# Patient Record
Sex: Female | Born: 1970 | Race: Black or African American | Hispanic: No | Marital: Married | Smoking: Never smoker
Health system: Southern US, Community
[De-identification: ages and names within clinical notes are randomized; demographics above are authoritative.]

## PROBLEM LIST (undated history)

## (undated) DIAGNOSIS — D649 Anemia, unspecified: Secondary | ICD-10-CM

## (undated) DIAGNOSIS — I10 Essential (primary) hypertension: Secondary | ICD-10-CM

## (undated) DIAGNOSIS — M549 Dorsalgia, unspecified: Secondary | ICD-10-CM

## (undated) HISTORY — PX: CHOLECYSTECTOMY: SHX55

---

## 2006-03-11 ENCOUNTER — Emergency Department (HOSPITAL_COMMUNITY): Admission: EM | Admit: 2006-03-11 | Discharge: 2006-03-11 | Payer: Self-pay | Admitting: Emergency Medicine

## 2007-11-02 ENCOUNTER — Emergency Department (HOSPITAL_COMMUNITY): Admission: EM | Admit: 2007-11-02 | Discharge: 2007-11-02 | Payer: Self-pay | Admitting: Emergency Medicine

## 2011-07-11 ENCOUNTER — Emergency Department: Payer: Self-pay | Admitting: *Deleted

## 2013-09-02 ENCOUNTER — Encounter (HOSPITAL_COMMUNITY): Payer: Self-pay | Admitting: Emergency Medicine

## 2013-09-02 ENCOUNTER — Emergency Department (HOSPITAL_COMMUNITY)
Admission: EM | Admit: 2013-09-02 | Discharge: 2013-09-02 | Disposition: A | Discharge status: Home / Self Care | Payer: Self-pay | Attending: Emergency Medicine | Admitting: Emergency Medicine

## 2013-09-02 DIAGNOSIS — I10 Essential (primary) hypertension: Secondary | ICD-10-CM | POA: Insufficient documentation

## 2013-09-02 DIAGNOSIS — S39012A Strain of muscle, fascia and tendon of lower back, initial encounter: Secondary | ICD-10-CM

## 2013-09-02 DIAGNOSIS — S335XXA Sprain of ligaments of lumbar spine, initial encounter: Secondary | ICD-10-CM | POA: Insufficient documentation

## 2013-09-02 DIAGNOSIS — Z79899 Other long term (current) drug therapy: Secondary | ICD-10-CM | POA: Insufficient documentation

## 2013-09-02 DIAGNOSIS — IMO0002 Reserved for concepts with insufficient information to code with codable children: Secondary | ICD-10-CM | POA: Insufficient documentation

## 2013-09-02 DIAGNOSIS — IMO0001 Reserved for inherently not codable concepts without codable children: Secondary | ICD-10-CM | POA: Insufficient documentation

## 2013-09-02 DIAGNOSIS — Y9389 Activity, other specified: Secondary | ICD-10-CM | POA: Insufficient documentation

## 2013-09-02 DIAGNOSIS — Y92410 Unspecified street and highway as the place of occurrence of the external cause: Secondary | ICD-10-CM | POA: Insufficient documentation

## 2013-09-02 HISTORY — DX: Essential (primary) hypertension: I10

## 2013-09-02 MED ORDER — NAPROXEN 250 MG PO TABS
500.0000 mg | ORAL_TABLET | Freq: Once | ORAL | Status: AC
  Administered 2013-09-02: 500 mg via ORAL
  Filled 2013-09-02: qty 2

## 2013-09-02 MED ORDER — NAPROXEN 500 MG PO TABS
500.0000 mg | ORAL_TABLET | Freq: Two times a day (BID) | ORAL | Status: DC
Start: 2013-09-02 — End: 2014-04-24

## 2013-09-02 MED ORDER — METHOCARBAMOL 500 MG PO TABS
1000.0000 mg | ORAL_TABLET | Freq: Once | ORAL | Status: AC
  Administered 2013-09-02: 1000 mg via ORAL
  Filled 2013-09-02: qty 2

## 2013-09-02 MED ORDER — METHOCARBAMOL 500 MG PO TABS: 1000.0000 mg | ORAL_TABLET | Freq: Four times a day (QID) | ORAL | Status: AC
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
## 2013-09-04 NOTE — ED Provider Notes (Signed)
Medical screening examination/treatment/procedure(s) were performed by non-physician practitioner and as supervising physician I was immediately available for consultation/collaboration.   EKG Interpretation None        Maudry Diego, MD 09/04/13 1451

## 2014-01-22 ENCOUNTER — Emergency Department (HOSPITAL_COMMUNITY)
Admission: EM | Admit: 2014-01-22 | Discharge: 2014-01-22 | Disposition: A | Discharge status: Home / Self Care | Payer: Medicaid - Out of State | Attending: Emergency Medicine | Admitting: Emergency Medicine

## 2014-01-22 ENCOUNTER — Encounter (HOSPITAL_COMMUNITY): Payer: Self-pay | Admitting: *Deleted

## 2014-01-22 DIAGNOSIS — J069 Acute upper respiratory infection, unspecified: Secondary | ICD-10-CM | POA: Insufficient documentation

## 2014-01-22 DIAGNOSIS — G8929 Other chronic pain: Secondary | ICD-10-CM | POA: Diagnosis not present

## 2014-01-22 DIAGNOSIS — Z79899 Other long term (current) drug therapy: Secondary | ICD-10-CM | POA: Insufficient documentation

## 2014-01-22 DIAGNOSIS — Z7952 Long term (current) use of systemic steroids: Secondary | ICD-10-CM | POA: Insufficient documentation

## 2014-01-22 DIAGNOSIS — Z791 Long term (current) use of non-steroidal anti-inflammatories (NSAID): Secondary | ICD-10-CM | POA: Diagnosis not present

## 2014-01-22 DIAGNOSIS — M549 Dorsalgia, unspecified: Secondary | ICD-10-CM | POA: Insufficient documentation

## 2014-01-22 DIAGNOSIS — I10 Essential (primary) hypertension: Secondary | ICD-10-CM | POA: Insufficient documentation

## 2014-01-22 DIAGNOSIS — R05 Cough: Secondary | ICD-10-CM | POA: Diagnosis present

## 2014-01-22 HISTORY — DX: Dorsalgia, unspecified: M54.9

## 2014-01-22 MED ORDER — HYDROCODONE-ACETAMINOPHEN 5-325 MG PO TABS: 1.0000 | ORAL_TABLET | ORAL | Status: DC | PRN

## 2014-01-22 MED ORDER — BENZONATATE 100 MG PO CAPS
ORAL_CAPSULE | ORAL | Status: AC
  Administered 2014-01-22: 200 mg via ORAL
  Filled 2014-01-22: qty 1

## 2014-01-22 MED ORDER — BENZONATATE 100 MG PO CAPS: 200.0000 mg | ORAL_CAPSULE | Freq: Three times a day (TID) | ORAL | Status: DC | PRN

## 2014-01-22 MED ORDER — PREDNISONE 50 MG PO TABS
60.0000 mg | ORAL_TABLET | Freq: Once | ORAL | Status: AC
  Administered 2014-01-22: 60 mg via ORAL
  Filled 2014-01-22 (×2): qty 1

## 2014-01-22 MED ORDER — PREDNISONE 10 MG PO TABS: ORAL_TABLET | ORAL | Status: DC

## 2014-01-22 MED ORDER — BENZONATATE 100 MG PO CAPS
200.0000 mg | ORAL_CAPSULE | Freq: Once | ORAL | Status: AC
  Administered 2014-01-22: 200 mg via ORAL
  Filled 2014-01-22: qty 2

## 2014-01-22 NOTE — ED Notes (Signed)
Patient given discharge instruction, verbalized understand. Patient ambulatory out of the department.  

## 2014-01-22 NOTE — Discharge Instructions (Signed)
Back Pain, Adult Back pain is very common. The pain often gets better over time. The cause of back pain is usually not dangerous. Most people can learn to manage their back pain on their own.  HOME CARE   Stay active. Start with short walks on flat ground if you can. Try to walk farther each day.  Do not sit, drive, or stand in one place for more than 30 minutes. Do not stay in bed.  Do not avoid exercise or work. Activity can help your back heal faster.  Be careful when you bend or lift an object. Bend at your knees, keep the object close to you, and do not twist.  Sleep on a firm mattress. Lie on your side, and bend your knees. If you lie on your back, put a pillow under your knees.  Only take medicines as told by your doctor.  Put ice on the injured area.  Put ice in a plastic bag.  Place a towel between your skin and the bag.  Leave the ice on for 15-20 minutes, 03-04 times a day for the first 2 to 3 days. After that, you can switch between ice and heat packs.  Ask your doctor about back exercises or massage.  Avoid feeling anxious or stressed. Find good ways to deal with stress, such as exercise. GET HELP RIGHT AWAY IF:   Your pain does not go away with rest or medicine.  Your pain does not go away in 1 week.  You have new problems.  You do not feel well.  The pain spreads into your legs.  You cannot control when you poop (bowel movement) or pee (urinate).  Your arms or legs feel weak or lose feeling (numbness).  You feel sick to your stomach (nauseous) or throw up (vomit).  You have belly (abdominal) pain.  You feel like you may pass out (faint). MAKE SURE YOU:   Understand these instructions.  Will watch your condition.  Will get help right away if you are not doing well or get worse. Document Released: 07/13/2007 Document Revised: 04/18/2011 Document Reviewed: 05/28/2013 [facility] Patient Information 2015 [place], [place]. This information is not intended  to replace advice given to you by your health care provider. Make sure you discuss any questions you have with your health care provider.  Upper Respiratory Infection, Adult An upper respiratory infection (URI) is also sometimes known as the common cold. The upper respiratory tract includes the nose, sinuses, throat, trachea, and bronchi. Bronchi are the airways leading to the lungs. Most people improve within 1 week, but symptoms can last up to 2 weeks. A residual cough may last even longer.  CAUSES Many different viruses can infect the tissues lining the upper respiratory tract. The tissues become irritated and inflamed and often become very moist. Mucus production is also common. A cold is contagious. You can easily spread the virus to others by oral contact. This includes kissing, sharing a glass, coughing, or sneezing. Touching your mouth or nose and then touching a surface, which is then touched by another person, can also spread the virus. SYMPTOMS  Symptoms typically develop 1 to 3 days after you come in contact with a cold virus. Symptoms vary from person to person. They may include:  Runny nose.  Sneezing.  Nasal congestion.  Sinus irritation.  Sore throat.  Loss of voice (laryngitis).  Cough.  Fatigue.  Muscle aches.  Loss of appetite.  Headache.  Low-grade fever. DIAGNOSIS  You might diagnose  your own cold based on familiar symptoms, since most people get a cold 2 to 3 times a year. Your caregiver can confirm this based on your exam. Most importantly, your caregiver can check that your symptoms are not due to another disease such as strep throat, sinusitis, pneumonia, asthma, or epiglottitis. Blood tests, throat tests, and X-rays are not necessary to diagnose a common cold, but they may sometimes be helpful in excluding other more serious diseases. Your caregiver will decide if any further tests are required. RISKS AND COMPLICATIONS  You may be at risk for a more severe  case of the common cold if you smoke cigarettes, have chronic heart disease (such as heart failure) or lung disease (such as asthma), or if you have a weakened immune system. The very young and very old are also at risk for more serious infections. Bacterial sinusitis, middle ear infections, and bacterial pneumonia can complicate the common cold. The common cold can worsen asthma and chronic obstructive pulmonary disease (COPD). Sometimes, these complications can require emergency medical care and may be life-threatening. PREVENTION  The best way to protect against getting a cold is to practice good hygiene. Avoid oral or hand contact with people with cold symptoms. Wash your hands often if contact occurs. There is no clear evidence that vitamin C, vitamin E, echinacea, or exercise reduces the chance of developing a cold. However, it is always recommended to get plenty of rest and practice good nutrition. TREATMENT  Treatment is directed at relieving symptoms. There is no cure. Antibiotics are not effective, because the infection is caused by a virus, not by bacteria. Treatment may include:  Increased fluid intake. Sports drinks offer valuable electrolytes, sugars, and fluids.  Breathing heated mist or steam (vaporizer or shower).  Eating chicken soup or other clear broths, and maintaining good nutrition.  Getting plenty of rest.  Using gargles or lozenges for comfort.  Controlling fevers with ibuprofen or acetaminophen as directed by your caregiver.  Increasing usage of your inhaler if you have asthma. Zinc gel and zinc lozenges, taken in the first 24 hours of the common cold, can shorten the duration and lessen the severity of symptoms. Pain medicines may help with fever, muscle aches, and throat pain. A variety of non-prescription medicines are available to treat congestion and runny nose. Your caregiver can make recommendations and may suggest nasal or lung inhalers for other symptoms.  HOME  CARE INSTRUCTIONS   Only take over-the-counter or prescription medicines for pain, discomfort, or fever as directed by your caregiver.  Use a warm mist humidifier or inhale steam from a shower to increase air moisture. This may keep secretions moist and make it easier to breathe.  Drink enough water and fluids to keep your urine clear or pale yellow.  Rest as needed.  Return to work when your temperature has returned to normal or as your caregiver advises. You may need to stay home longer to avoid infecting others. You can also use a face mask and careful hand washing to prevent spread of the virus. SEEK MEDICAL CARE IF:   After the first few days, you feel you are getting worse rather than better.  You need your caregiver's advice about medicines to control symptoms.  You develop chills, worsening shortness of breath, or brown or red sputum. These may be signs of pneumonia.  You develop yellow or brown nasal discharge or pain in the face, especially when you bend forward. These may be signs of sinusitis.  You  develop a fever, swollen neck glands, pain with swallowing, or white areas in the back of your throat. These may be signs of strep throat. SEEK IMMEDIATE MEDICAL CARE IF:   You have a fever.  You develop severe or persistent headache, ear pain, sinus pain, or chest pain.  You develop wheezing, a prolonged cough, cough up blood, or have a change in your usual mucus (if you have chronic lung disease).  You develop sore muscles or a stiff neck. Document Released: 07/20/2000 Document Revised: 04/18/2011 Document Reviewed: 05/01/2013 [facility] Patient Information 2015 [place], [place]. This information is not intended to replace advice given to you by your health care provider. Make sure you discuss any questions you have with your health care provider.    Emergency Department Resource Guide 1) Find a Doctor and Pay Out of Pocket Although you won't have to find out who is covered  by your insurance plan, it is a good idea to ask around and get recommendations. You will then need to call the office and see if the doctor you have chosen will accept you as a new patient and what types of options they offer for patients who are self-pay. Some doctors offer discounts or will set up payment plans for their patients who do not have insurance, but you will need to ask so you aren't surprised when you get to your appointment.  2) Contact Your Local Health Department Not all health departments have doctors that can see patients for sick visits, but many do, so it is worth a call to see if yours does. If you don't know where your local health department is, you can check in your phone book. The CDC also has a tool to help you locate your state's health department, and many state websites also have listings of all of their local health departments.  3) Find a [facility] If your illness is not likely to be very severe or complicated, you may want to try a walk in clinic. These are popping up all over the country in pharmacies, drugstores, and shopping centers. They're usually staffed by nurse practitioners or physician assistants that have been trained to treat common illnesses and complaints. They're usually fairly quick and inexpensive. However, if you have serious medical issues or chronic medical problems, these are probably not your best option.  No Primary Care Doctor: - Call Health Connect at  [phone] - they can help you locate a primary care doctor that  accepts your insurance, provides certain services, etc. - Physician Referral Service- [phone]  Chronic Pain Problems: Organization         Address  Phone   Notes  [facility]  [phone] Patients need to be referred by their primary care doctor.   Medication Assistance: Organization         Address  Phone   Notes  [facility] Medication [facility] [place] [place], [place] [address] [phone] --Must be a resident of [facility] -- Must have NO insurance coverage whatsoever (no Medicaid/ Medicare, etc.) -- The pt. MUST have a primary care doctor that directs their care regularly and follows them in the community   Med[facility]ist  [phone]   [employer]  [phone]    Agencies that provide inexpensive medical care: Organization         Address  Phone   Notes  [place]  [phone]   [facility]    [phone]   [facility] [place], [place] [address] [phone]   [place] [address], [place] [phone]   Planned Parenthood    [phone]   [facility]    [phone]   [place] and [place] [address], [place] Phone:  [phone], Fax:  [phone] Hours of Operation:  9 am - 6 pm, M-F.  Also accepts Medicaid/Medicare and self-pay.  [facility] for [place] [address], [address], [place] Phone: [phone], Fax: [phone]. Hours of Operation:  8:30 am - 5:30 pm, M-F.  Also accepts Medicaid and self-pay.  [facility] [place] [address] [place] Phone: [phone]   [place], [place], [place] [phone], Ext. [phone] Mondays & Thursdays: 7-9 AM.  First 15 patients are seen on a first come, first serve basis.    [place] Providers:  Organization         Address  Phone   Notes  [facility] [address], [place] [phone] Also accepts self-pay patients.  [facility] [address], [place]  [phone]   [place], [address], [place] [phone]   [facility] [address] [place] [phone]   Lucianne Lei [address], [place]   [phone] Only accepts [place] Access [place] patients after they have their name applied to their card.   Self-Pay (no insurance) in [facility]:  Organization         Address  Phone   Notes  Sickle Cell Patients, [facility] Internal Medicine [place] [phone]   [facility] Urgent Care [place] [phone]   [facility] [place]  [place], [place], [place] [phone]   [facility]/Dr. Osei-Bonsu  [address], [place] or [address], [place] [phone] Phone number for both [place] and [place] locations is the same.  Urgent Medical and [facility] [address], [place] [phone]   [facility] [address] [place] or [address] [phone]   [facility] [address] [place] [phone], phone; [phone], fax Sees patients 1st and 3rd Saturday of every month.  Must not qualify for public or private insurance (i.e. Medicaid, Medicare, [place] Health Choice, Veterans' Benefits)  Household income should be no more than 200% of the poverty level The clinic cannot treat you if you are pregnant or think you are pregnant  Sexually transmitted diseases are not treated at the clinic.    Dental Care: Organization         Address  Phone  Notes  [facility] Department of [facility] [place] [phone] Accepts children up to age 21 who are enrolled in [place] or [place]; pregnant women with a Medicaid card; and children who have applied for Medicaid or [place] Health Choice, but were declined, whose parents can pay a reduced fee at time of service.  [facility] Department of [facility]  [address], [place] [phone]  Accepts children up to age 16 who are enrolled  in Medicaid or  Health Choice; pregnant women with a Medicaid card; and children who have applied for Medicaid or  Health Choice, but were declined, whose parents can pay a reduced fee at time of service.  [place] Adult [facility] Access PRO[facility]  [place] [phone] Patients are seen by appointment only. Walk-ins are not accepted. [place] will see patients 48 years of age and older. Monday - Tuesday (8am-5pm) Most Wednesdays (8:30-5pm) $30 per visit, cash only  [facility] Adult Dental Access PROGRAM  [address], [place] Sexually Violent [facility] Program [phone] Patients are seen by appointment only. Walk-ins are not accepted. [place] will see patients 36 years of age and older. One Wednesday Evening (Monthly: Volunteer Based).  $30 per visit, cash only  [place]  [phone] for adults; Children under age 49, call Graduate Pediatric Dentistry at [phone]. Children aged 33-14, please call [phone] to request a pediatric application.  Dental services are provided in all areas of dental care including fillings, crowns and bridges, complete and partial dentures, implants, gum treatment, root canals, and extractions. Preventive care is also provided. Treatment is provided to both adults and children. Patients are selected via a lottery and there is often a waiting list.   [facility] [address], [place]  [phone] [email or web address]   [facility] [address] [place], [place] [phone], Ext. [phone] Second and Fourth Thursday of each month, opens at 6:30 AM; Clinic ends at 9 AM.  Patients are seen on a first-come first-served basis, and a limited number are seen during each clinic.   [facility]  [address] Hillard Danker [place], [place] [phone]   Eligibility Requirements You must have lived in [place], [place], or [place] counties for at least the last three months.   You cannot be  eligible for state or federal sponsored [employer], including [employer], [place], or [employer].   You generally cannot be eligible for healthcare insurance through your employer.    How to apply: Eligibility screenings are held every Tuesday and Wednesday afternoon from 1:00 pm until 4:00 pm. You do not need an appointment for the interview!  [facility] [address], [place], [place]   [place]  [place] Department  [place]  [phone]    Behavioral Health Resources in the Community: Intensive Outpatient Programs Organization         Address  Phone  Notes  [place]. [address] [place], [place] [phone]   [facility] Outpatient [address], [place], [place]   ADS: Alcohol [facility] Drug [facility] [address], [place], [place]   [place] [address]  [place], [place] or [phone]   Substance Abuse Resources Organization         Address  Phone  Notes  Alcohol and Drug Services  [phone]   [place]  [phone]   [facility] [place]   Chinita Pester  [phone]   Residential & Outpatient Substance Abuse Program  [phone]   Psychological Services Organization         Address  Phone  Notes  [place]  [place]  [place]  [place] [address] [place] or [phone]    Mobile Crisis Teams Organization         Address  Phone  Notes  [facility], Mobile Crisis Care Unit  [phone]   [facility]  [address] [place], [place]   [place] [address] [place] [phone]    Self-Help/Support Groups Organization          Address  Phone             Notes  [place]. of [place] - variety of support groups  [place] Call for more information  Narcotics Anonymous (NA), [facility] [address], [employer] [place]  2 meetings at this location   Special educational needs teacher         Address  Phone  Notes  [facility] Residential Treatment [place],    [place]  [phone]   [facility]  [address] [place] [address], [place], [place]   [place], [place] [phone] Admissions: 8am-3pm M-F  Incentives Substance [place] [address]    [place], [place] [phone]   [facility] [address] [place], [place], [place]   [facility] [address],  [place], [place]   [facility] - Intensive Outpatient [address]een [address], [place], [place]   [facility] ([place] [place].,  [place], [place] [phone] or [phone]   [facility] (RTS) [address], [place], [place] Accepts Medicaid  [facility] [place] [address],  [place] [phone] Substance Abuse/Addiction Treatment   [facility] Organization         Address  Phone  Notes  [facility]  [phone]   Domenic Schwab, PhD [address] [place], [place]   [phone] or [phone]   [employer] [place], [place] [phone]   [facility] [address] [place], [place] [phone] Insurance/Medicaid/sponsorship through [facility] and Families [address], [address] [place]                                    [place], [place] [phone] [place] [address] [place] [address], [place] [phone]    Dr. Adele Schilder  [phone]   [facility] Dept. 1) [address] [place] 2) [place] 3)  [place] [address], [place] [phone]  [phone]   [place] [phone] or [phone] (After Hours)      You may take the hydrocodone prescribed for pain relief and cough suppression.  This will make you drowsy - do not drive within 4 hours of taking this medication.

## 2014-01-22 NOTE — ED Notes (Signed)
Pt states productive cough at times, clear in color. Lower back pain with hx of same. Denies urinary symptoms.

## 2014-01-24 NOTE — ED Provider Notes (Signed)
CSN: [id number]     Arrival date & time 01/22/14  1128 History   First MD Initiated Contact with Patient 01/22/14 1228     Chief Complaint  Patient presents with  . Cough     (Consider location/radiation/quality/duration/timing/severity/associated sxs/prior Treatment) The history is provided by the patient.   Diana Fox is a 43 y.o. female presenting with several day history of uri type symptoms which includes nasal congestion with clear rhinorrhea, sore throat and cough sometimes productive of a clear sputum.  Symptoms due to not include shortness of breath,hemoptysis,  chest pain,  Nausea, vomiting or diarrhea.  The patient has taken no medicines prior to arrival with no significant improvement in symptoms.  Additionally, reports recurrence of her chronic low back pain since yesterday.    Patient denies any new injury specifically,stating was injured on the job one year ago and while undergoing evaluation for this was terminated from her job.  She has tried to work but cannot find a job that she can tolerate with her chronic pain.  She denies radiation of pain nor  weakness or numbness in her legs and no urinary or bowel retention or incontinence.  Patient does not have a history of cancer or IVDU.       Past Medical History  Diagnosis Date  . Hypertension   . Back pain    Past Surgical History  Procedure Laterality Date  . Cholecystectomy     No family history on file. History  Substance Use Topics  . Smoking status: Never Smoker   . Smokeless tobacco: Not on file  . Alcohol Use: No   OB History    No data available     Review of Systems  Constitutional: Negative for fever and chills.  HENT: Positive for congestion, rhinorrhea and sore throat. Negative for ear pain, sinus pressure, trouble swallowing and voice change.   Eyes: Negative for discharge.  Respiratory: Positive for cough. Negative for shortness of breath, wheezing and stridor.   Cardiovascular:  Negative for chest pain and leg swelling.  Gastrointestinal: Negative for abdominal pain, constipation and abdominal distention.  Genitourinary: Negative.   Musculoskeletal: Positive for back pain. Negative for joint swelling and gait problem.  Skin: Negative for rash.  Neurological: Negative for weakness and numbness.      Allergies  Lisinopril  Home Medications   Prior to Admission medications   Medication Sig Start Date End Date Taking? Authorizing Provider  benzonatate (TESSALON) 100 MG capsule Take 2 capsules (200 mg total) by mouth 3 (three) times daily as needed for cough. 01/22/14   Evalee Jefferson, PA-C  HYDROcodone-acetaminophen (NORCO/VICODIN) 5-325 MG per tablet Take 1 tablet by mouth every 4 (four) hours as needed. 01/22/14   Evalee Jefferson, PA-C  naproxen (NAPROSYN) 500 MG tablet Take 1 tablet (500 mg total) by mouth 2 (two) times daily. 09/02/13   Evalee Jefferson, PA-C  nebivolol (BYSTOLIC) 10 MG tablet Take 10 mg by mouth daily.    Historical Provider, MD  predniSONE (DELTASONE) 10 MG tablet 6, 5, 4, 3, 2 then 1 tablet by mouth daily for 6 days total. 01/22/14   Evalee Jefferson, PA-C   BP 145/90 mmHg  Pulse 77  Temp(Src) 98.1 F (36.7 C) (Oral)  Ht 5\' 5"  (1.651 m)  Wt 172 lb 1 oz (78.047 kg)  BMI 28.63 kg/m2  SpO2 100%  LMP 01/07/2014 Physical Exam  Constitutional: She is oriented to person, place, and time. She appears well-developed and well-nourished.  HENT:  Head:  Normocephalic and atraumatic.  Right Ear: Tympanic membrane and ear canal normal.  Left Ear: Tympanic membrane and ear canal normal.  Nose: Mucosal edema and rhinorrhea present.  Mouth/Throat: Uvula is midline, oropharynx is clear and moist and mucous membranes are normal. No oropharyngeal exudate, posterior oropharyngeal edema, posterior oropharyngeal erythema or tonsillar abscesses.  Eyes: Conjunctivae are normal.  Neck: Normal range of motion. Neck supple.  Cardiovascular: Normal rate, normal heart sounds and  intact distal pulses.   Pedal pulses normal.  Pulmonary/Chest: Effort normal. No respiratory distress. She has no wheezes. She has no rales.  Abdominal: Soft. Bowel sounds are normal. She exhibits no distension and no mass. There is no tenderness.  Musculoskeletal: Normal range of motion. She exhibits no edema.       Lumbar back: She exhibits tenderness. She exhibits no swelling, no edema and no spasm.  Neurological: She is alert and oriented to person, place, and time. She has normal strength. She displays no atrophy and no tremor. No sensory deficit. Gait normal.  Reflex Scores:      Patellar reflexes are 2+ on the right side and 2+ on the left side.      Achilles reflexes are 2+ on the right side and 2+ on the left side. No strength deficit noted in hip and knee flexor and extensor muscle groups.  Ankle flexion and extension intact.  Skin: Skin is warm and dry. No rash noted.  Psychiatric: She has a normal mood and affect.  Nursing note and vitals reviewed.   ED Course  Procedures (including critical care time) Labs Review Labs Reviewed - No data to display  Imaging Review No results found.   EKG Interpretation None      MDM   Final diagnoses:  URI (upper respiratory infection)  Chronic back pain    Pt was prescribed prednisone taper and hydrocodone for her back pain and cough suppression.  Tessalon.  Referral to [facility] in [place] who may be able to assist with further eval/treatment of back in anticipation of getting pt working again.  Advised activity as tolerated.  Suspect viral uri process.  No respiratory distress, lungs clear.  Encouraged rest, increased fluid intake.  No neuro deficit on exam or by history to suggest emergent or surgical presentation.  Also discussed worsened sx that should prompt immediate re-evaluation including distal weakness, bowel/bladder retention/incontinence.          Evalee Jefferson, PA-C 01/24/14 1818  Maudry Diego,  MD 01/30/14 [phone]

## 2014-04-24 ENCOUNTER — Emergency Department (HOSPITAL_COMMUNITY)
Admission: EM | Admit: 2014-04-24 | Discharge: 2014-04-24 | Disposition: A | Discharge status: Home / Self Care | Payer: Medicaid - Out of State | Attending: Emergency Medicine | Admitting: Emergency Medicine

## 2014-04-24 ENCOUNTER — Encounter (HOSPITAL_COMMUNITY): Payer: Self-pay | Admitting: *Deleted

## 2014-04-24 DIAGNOSIS — Y939 Activity, unspecified: Secondary | ICD-10-CM | POA: Insufficient documentation

## 2014-04-24 DIAGNOSIS — I10 Essential (primary) hypertension: Secondary | ICD-10-CM | POA: Insufficient documentation

## 2014-04-24 DIAGNOSIS — Y999 Unspecified external cause status: Secondary | ICD-10-CM | POA: Insufficient documentation

## 2014-04-24 DIAGNOSIS — Z79899 Other long term (current) drug therapy: Secondary | ICD-10-CM | POA: Insufficient documentation

## 2014-04-24 DIAGNOSIS — X58XXXA Exposure to other specified factors, initial encounter: Secondary | ICD-10-CM | POA: Insufficient documentation

## 2014-04-24 DIAGNOSIS — S39012A Strain of muscle, fascia and tendon of lower back, initial encounter: Secondary | ICD-10-CM | POA: Insufficient documentation

## 2014-04-24 DIAGNOSIS — Y929 Unspecified place or not applicable: Secondary | ICD-10-CM | POA: Insufficient documentation

## 2014-04-24 MED ORDER — CYCLOBENZAPRINE HCL 5 MG PO TABS: 5.0000 mg | ORAL_TABLET | Freq: Three times a day (TID) | ORAL | Status: DC | PRN

## 2014-04-24 MED ORDER — NAPROXEN 500 MG PO TABS: 500.0000 mg | ORAL_TABLET | Freq: Two times a day (BID) | ORAL | Status: DC

## 2014-04-24 MED ORDER — HYDROCODONE-ACETAMINOPHEN 5-325 MG PO TABS: 1.0000 | ORAL_TABLET | ORAL | Status: DC | PRN

## 2014-04-24 NOTE — ED Notes (Signed)
Pt states lower back pain which began yesterday. States old injury 2 years ago.

## 2014-04-24 NOTE — ED Provider Notes (Signed)
CSN: [id number]     Arrival date & time 04/24/14  1353 History   First MD Initiated Contact with Patient 04/24/14 1531     Chief Complaint  Patient presents with  . Back Pain     (Consider location/radiation/quality/duration/timing/severity/associated sxs/prior Treatment) The history is provided by the patient.   Diana Fox is a 44 y.o. female presenting with acute low back pain starting yesterday.   Patient denies any new injury specifically, but experience similar symptoms on 2 other occasions since injuring her back at her job 2 years ago.  There is radiation of pain and denies no weakness or numbness into her lower extremities.  She denies urinary or bowel retention or incontinence.  Patient does not have a history of cancer or IVDU.  The patient has tried bayer back pain reliever without significant relief of symptoms.     Past Medical History  Diagnosis Date  . Hypertension   . Back pain    Past Surgical History  Procedure Laterality Date  . Cholecystectomy     No family history on file. History  Substance Use Topics  . Smoking status: Never Smoker   . Smokeless tobacco: Not on file  . Alcohol Use: No   OB History    No data available     Review of Systems  Constitutional: Negative for fever.  Respiratory: Negative for shortness of breath.   Cardiovascular: Negative for chest pain and leg swelling.  Gastrointestinal: Negative for abdominal pain, constipation and abdominal distention.  Genitourinary: Negative for dysuria, urgency, frequency, flank pain and difficulty urinating.  Musculoskeletal: Positive for back pain. Negative for joint swelling and gait problem.  Skin: Negative for rash.  Neurological: Negative for weakness and numbness.      Allergies  Lisinopril  Home Medications   Prior to Admission medications   Medication Sig Start Date End Date Taking? Authorizing Provider  nebivolol (BYSTOLIC) 10 MG tablet Take 10 mg by mouth daily.   Yes  Historical Provider, MD  benzonatate (TESSALON) 100 MG capsule Take 2 capsules (200 mg total) by mouth 3 (three) times daily as needed for cough. Patient not taking: Reported on 04/24/2014 01/22/14   Evalee Jefferson, PA-C  cyclobenzaprine (FLEXERIL) 5 MG tablet Take 1 tablet (5 mg total) by mouth 3 (three) times daily as needed for muscle spasms. 04/24/14   Evalee Jefferson, PA-C  HYDROcodone-acetaminophen (NORCO/VICODIN) 5-325 MG per tablet Take 1 tablet by mouth every 4 (four) hours as needed. 04/24/14   Evalee Jefferson, PA-C  naproxen (NAPROSYN) 500 MG tablet Take 1 tablet (500 mg total) by mouth 2 (two) times daily. 04/24/14   Evalee Jefferson, PA-C  predniSONE (DELTASONE) 10 MG tablet 6, 5, 4, 3, 2 then 1 tablet by mouth daily for 6 days total. Patient not taking: Reported on 04/24/2014 01/22/14   Evalee Jefferson, PA-C   BP 151/75 mmHg  Pulse 85  Temp(Src) 98.7 F (37.1 C) (Oral)  Resp 14  Ht 5\' 5"  (1.651 m)  Wt 160 lb (72.576 kg)  BMI 26.63 kg/m2  SpO2 98%  LMP 04/23/2014 Physical Exam  Constitutional: She appears well-developed and well-nourished.  HENT:  Head: Normocephalic.  Eyes: Conjunctivae are normal.  Neck: Normal range of motion. Neck supple.  Cardiovascular: Normal rate and intact distal pulses.   Pedal pulses normal.  Pulmonary/Chest: Effort normal.  Abdominal: Soft. Bowel sounds are normal. She exhibits no distension and no mass.  Musculoskeletal: Normal range of motion. She exhibits no edema.  Lumbar back: She exhibits tenderness. She exhibits no swelling, no edema and no spasm.  paralumbar ttp.  No midline pain, swelling or deformity.  Neurological: She is alert. She has normal strength. She displays no atrophy and no tremor. No sensory deficit. Gait normal.  Reflex Scores:      Patellar reflexes are 2+ on the right side and 2+ on the left side.      Achilles reflexes are 2+ on the right side and 2+ on the left side. No strength deficit noted in hip and knee flexor and extensor muscle  groups.  Ankle flexion and extension intact.  Skin: Skin is warm and dry.  Psychiatric: She has a normal mood and affect.  Nursing note and vitals reviewed.   ED Course  Procedures (including critical care time) Labs Review Labs Reviewed - No data to display  Imaging Review No results found.   EKG Interpretation None      MDM   Final diagnoses:  Lumbar strain, initial encounter    Acute lumbar pain.  Pt was prescribed naproxen, flexeril, hydrocodone for 24 hour coverage.  Heat, f/u with pcp for persistent sx.  No neuro deficit on exam or by history to suggest emergent or surgical presentation.  Also discussed worsened sx that should prompt immediate re-evaluation including distal weakness, bowel/bladder retention/incontinence.          Evalee Jefferson, PA-C 04/24/14 1649  Milton Ferguson, MD 04/28/14 [phone]

## 2014-04-24 NOTE — Discharge Instructions (Signed)
Back Pain, Adult °Low back pain is very common. About 1 in 5 people have back pain. The cause of low back pain is rarely dangerous. The pain often gets better over time. About half of people with a sudden onset of back pain feel better in just 2 weeks. About 8 in 10 people feel better by 6 weeks.  °CAUSES °Some common causes of back pain include: °· Strain of the muscles or ligaments supporting the spine. °· Wear and tear (degeneration) of the spinal discs. °· Arthritis. °· Direct injury to the back. °DIAGNOSIS °Most of the time, the direct cause of low back pain is not known. However, back pain can be treated effectively even when the exact cause of the pain is unknown. Answering your caregiver's questions about your overall health and symptoms is one of the most accurate ways to make sure the cause of your pain is not dangerous. If your caregiver needs more information, he or she may order lab work or imaging tests (X-rays or MRIs). However, even if imaging tests show changes in your back, this usually does not require surgery. °HOME CARE INSTRUCTIONS °For many people, back pain returns. Since low back pain is rarely dangerous, it is often a condition that people can learn to manage on their own.  °· Remain active. It is stressful on the back to sit or stand in one place. Do not sit, drive, or stand in one place for more than 30 minutes at a time. Take short walks on level surfaces as soon as pain allows. Try to increase the length of time you walk each day. °· Do not stay in bed. Resting more than 1 or 2 days can delay your recovery. °· Do not avoid exercise or work. Your body is made to move. It is not dangerous to be active, even though your back may hurt. Your back will likely heal faster if you return to being active before your pain is gone. °· Pay attention to your body when you  bend and lift. Many people have less discomfort when lifting if they bend their knees, keep the load close to their bodies, and  avoid twisting. Often, the most comfortable positions are those that put less stress on your recovering back. °· Find a comfortable position to sleep. Use a firm mattress and lie on your side with your knees slightly bent. If you lie on your back, put a pillow under your knees. °· Only take over-the-counter or prescription medicines as directed by your caregiver. Over-the-counter medicines to reduce pain and inflammation are often the most helpful. Your caregiver may prescribe muscle relaxant drugs. These medicines help dull your pain so you can more quickly return to your normal activities and healthy exercise. °· Put ice on the injured area. °¨ Put ice in a plastic bag. °¨ Place a towel between your skin and the bag. °¨ Leave the ice on for 15-20 minutes, 03-04 times a day for the first 2 to 3 days. After that, ice and heat may be alternated to reduce pain and spasms. °· Ask your caregiver about trying back exercises and gentle massage. This may be of some benefit. °· Avoid feeling anxious or stressed. Stress increases muscle tension and can worsen back pain. It is important to recognize when you are anxious or stressed and learn ways to manage it. Exercise is a great option. °SEEK MEDICAL CARE IF: °· You have pain that is not relieved with rest or medicine. °· You have pain that does not improve in 1 week. °· You have new symptoms. °· You are generally not feeling well. °SEEK   IMMEDIATE MEDICAL CARE IF:   You have pain that radiates from your back into your legs.  You develop new bowel or bladder control problems.  You have unusual weakness or numbness in your arms or legs.  You develop nausea or vomiting.  You develop abdominal pain.  You feel faint. Document Released: 01/24/2005 Document Revised: 07/26/2011 Document Reviewed: 05/28/2013 [facility] Patient Information 2015 [place], [place]. This information is not intended to replace advice given to you by your health care provider. Make sure you  discuss any questions you have with your health care provider.    Do not drive within 4 hours of taking hydrocodone as this will make you drowsy.  Avoid lifting,  Bending,  Twisting or any other activity that worsens your pain over the next week.  Apply a heating pad to your lower back for 20 minutes 3 times daily.  You should get rechecked if your symptoms are not better over the next 5 days,  Or you develop increased pain,  Weakness in your leg(s) or loss of bladder or bowel function - these are symptoms of a worse injury.

## 2014-04-24 NOTE — ED Notes (Signed)
Low back pain for last few days, no recent injury.

## 2014-10-14 ENCOUNTER — Emergency Department (HOSPITAL_COMMUNITY)
Admission: EM | Admit: 2014-10-14 | Discharge: 2014-10-14 | Disposition: A | Discharge status: Home / Self Care | Payer: Medicaid - Out of State | Attending: Emergency Medicine | Admitting: Emergency Medicine

## 2014-10-14 ENCOUNTER — Encounter (HOSPITAL_COMMUNITY): Payer: Self-pay | Admitting: Emergency Medicine

## 2014-10-14 DIAGNOSIS — I10 Essential (primary) hypertension: Secondary | ICD-10-CM | POA: Insufficient documentation

## 2014-10-14 DIAGNOSIS — Z791 Long term (current) use of non-steroidal anti-inflammatories (NSAID): Secondary | ICD-10-CM | POA: Insufficient documentation

## 2014-10-14 DIAGNOSIS — S39012A Strain of muscle, fascia and tendon of lower back, initial encounter: Secondary | ICD-10-CM | POA: Insufficient documentation

## 2014-10-14 DIAGNOSIS — Y939 Activity, unspecified: Secondary | ICD-10-CM | POA: Insufficient documentation

## 2014-10-14 DIAGNOSIS — J029 Acute pharyngitis, unspecified: Secondary | ICD-10-CM

## 2014-10-14 DIAGNOSIS — H9201 Otalgia, right ear: Secondary | ICD-10-CM | POA: Insufficient documentation

## 2014-10-14 DIAGNOSIS — X58XXXA Exposure to other specified factors, initial encounter: Secondary | ICD-10-CM | POA: Insufficient documentation

## 2014-10-14 DIAGNOSIS — G8929 Other chronic pain: Secondary | ICD-10-CM | POA: Insufficient documentation

## 2014-10-14 DIAGNOSIS — Y999 Unspecified external cause status: Secondary | ICD-10-CM | POA: Insufficient documentation

## 2014-10-14 DIAGNOSIS — Y929 Unspecified place or not applicable: Secondary | ICD-10-CM | POA: Insufficient documentation

## 2014-10-14 DIAGNOSIS — Z79899 Other long term (current) drug therapy: Secondary | ICD-10-CM | POA: Insufficient documentation

## 2014-10-14 MED ORDER — DICLOFENAC SODIUM 75 MG PO TBEC: 75.0000 mg | DELAYED_RELEASE_TABLET | Freq: Two times a day (BID) | ORAL | Status: DC

## 2014-10-14 MED ORDER — METHOCARBAMOL 500 MG PO TABS: 500.0000 mg | ORAL_TABLET | Freq: Three times a day (TID) | ORAL | Status: DC

## 2014-10-14 MED ORDER — DEXAMETHASONE 4 MG PO TABS: 4.0000 mg | ORAL_TABLET | Freq: Two times a day (BID) | ORAL | Status: DC

## 2014-10-14 NOTE — ED Notes (Signed)
Pt c/o r earache since yesterday and pain in lower back since this morning.  Reports pain in lower back is chronic and flares up from time to time.

## 2014-10-14 NOTE — Discharge Instructions (Signed)
Please use a heating pad to your lower back. Please use Robaxin, diclofenac, and Decadron daily with food. See your Medicaid access physician for additional evaluation and management concerning your back. No acute deficits appreciated on today's examination.  Your ears are clear, but there is some increased redness of the back of your throat. Please use salt water gargles daily. Tylenol every 4 hours may also be helpful. Lumbosacral Strain Lumbosacral strain is a strain of any of the parts that make up your lumbosacral vertebrae. Your lumbosacral vertebrae are the bones that make up the lower third of your backbone. Your lumbosacral vertebrae are held together by muscles and tough, fibrous tissue (ligaments).  CAUSES  A sudden blow to your back can cause lumbosacral strain. Also, anything that causes an excessive stretch of the muscles in the low back can cause this strain. This is typically seen when people exert themselves strenuously, fall, lift heavy objects, bend, or crouch repeatedly. RISK FACTORS  Physically demanding work.  Participation in pushing or pulling sports or sports that require a sudden twist of the back (tennis, golf, baseball).  Weight lifting.  Excessive lower back curvature.  Forward-tilted pelvis.  Weak back or abdominal muscles or both.  Tight hamstrings. SIGNS AND SYMPTOMS  Lumbosacral strain may cause pain in the area of your injury or pain that moves (radiates) down your leg.  DIAGNOSIS Your health care provider can often diagnose lumbosacral strain through a physical exam. In some cases, you may need tests such as X-ray exams.  TREATMENT  Treatment for your lower back injury depends on many factors that your clinician will have to evaluate. However, most treatment will include the use of anti-inflammatory medicines. HOME CARE INSTRUCTIONS   Avoid hard physical activities (tennis, racquetball, waterskiing) if you are not in proper physical condition for it.  This may aggravate or create problems.  If you have a back problem, avoid sports requiring sudden body movements. Swimming and walking are generally safer activities.  Maintain good posture.  Maintain a healthy weight.  For acute conditions, you may put ice on the injured area.  Put ice in a plastic bag.  Place a towel between your skin and the bag.  Leave the ice on for 20 minutes, 2-3 times a day.  When the low back starts healing, stretching and strengthening exercises may be recommended. SEEK MEDICAL CARE IF:  Your back pain is getting worse.  You experience severe back pain not relieved with medicines. SEEK IMMEDIATE MEDICAL CARE IF:   You have numbness, tingling, weakness, or problems with the use of your arms or legs.  There is a change in bowel or bladder control.  You have increasing pain in any area of the body, including your belly (abdomen).  You notice shortness of breath, dizziness, or feel faint.  You feel sick to your stomach (nauseous), are throwing up (vomiting), or become sweaty.  You notice discoloration of your toes or legs, or your feet get very cold. MAKE SURE YOU:   Understand these instructions.  Will watch your condition.  Will get help right away if you are not doing well or get worse. Document Released: 11/03/2004 Document Revised: 01/29/2013 Document Reviewed: 09/12/2012 [facility] Patient Information 2015 [place], [place]. This information is not intended to replace advice given to you by your health care provider. Make sure you discuss any questions you have with your health care provider.

## 2014-10-14 NOTE — ED Provider Notes (Signed)
CSN: [id number]     Arrival date & time 10/14/14  1524 History   First MD Initiated Contact with Patient 10/14/14 1538     Chief Complaint  Patient presents with  . Back Pain     (Consider location/radiation/quality/duration/timing/severity/associated sxs/prior Treatment) HPI Comments: Pt c/o right ear pain that started 4 days ago. No drainage. No reported fever. No injury or trauma to the right ear. She has noted some sore throat, but no problem swallowing.  Patient is a 44 y.o. female presenting with back pain. The history is provided by the patient.  Back Pain Location:  Lumbar spine Quality:  Aching Pain severity:  Moderate Pain is:  Same all the time Onset quality:  Gradual Duration:  8 hours Timing:  Intermittent Progression:  Worsening Chronicity: acute on chronic. Context comment:  Lumbar strain in March of 2016. Relieved by:  Nothing Worsened by:  Standing and movement Ineffective treatments:  Bed rest and heating pad Associated symptoms: no bladder incontinence, no bowel incontinence, no paresthesias and no perianal numbness   Risk factors: no recent surgery     Past Medical History  Diagnosis Date  . Hypertension   . Back pain    Past Surgical History  Procedure Laterality Date  . Cholecystectomy     No family history on file. Social History  Substance Use Topics  . Smoking status: Never Smoker   . Smokeless tobacco: None  . Alcohol Use: No   OB History    No data available     Review of Systems  Gastrointestinal: Negative for bowel incontinence.  Genitourinary: Negative for bladder incontinence.  Musculoskeletal: Positive for back pain.  Neurological: Negative for paresthesias.  All other systems reviewed and are negative.     Allergies  Motrin and Lisinopril  Home Medications   Prior to Admission medications   Medication Sig Start Date End Date Taking? Authorizing Provider  benzonatate (TESSALON) 100 MG capsule Take 2 capsules (200 mg  total) by mouth 3 (three) times daily as needed for cough. Patient not taking: Reported on 04/24/2014 01/22/14   Evalee Jefferson, PA-C  cyclobenzaprine (FLEXERIL) 5 MG tablet Take 1 tablet (5 mg total) by mouth 3 (three) times daily as needed for muscle spasms. 04/24/14   Evalee Jefferson, PA-C  HYDROcodone-acetaminophen (NORCO/VICODIN) 5-325 MG per tablet Take 1 tablet by mouth every 4 (four) hours as needed. 04/24/14   Evalee Jefferson, PA-C  naproxen (NAPROSYN) 500 MG tablet Take 1 tablet (500 mg total) by mouth 2 (two) times daily. 04/24/14   Evalee Jefferson, PA-C  nebivolol (BYSTOLIC) 10 MG tablet Take 10 mg by mouth daily.    Historical Provider, MD  predniSONE (DELTASONE) 10 MG tablet 6, 5, 4, 3, 2 then 1 tablet by mouth daily for 6 days total. Patient not taking: Reported on 04/24/2014 01/22/14   Evalee Jefferson, PA-C   BP 137/84 mmHg  Pulse 85  Temp(Src) 98.2 F (36.8 C)  Resp 18  Ht 5\' 5"  (1.651 m)  Wt 160 lb (72.576 kg)  BMI 26.63 kg/m2  SpO2 100%  LMP 10/13/2014 Physical Exam  Constitutional: She is oriented to person, place, and time. She appears well-developed and well-nourished.  Non-toxic appearance.  HENT:  Head: Normocephalic.  Right Ear: Tympanic membrane and external ear normal.  Left Ear: Tympanic membrane and external ear normal.  Increase redness of posterior pharynx. Mild swelling of the uvula. No exudate. Airway patent.  Eyes: EOM and lids are normal. Pupils are equal, round, and reactive to  light.  Neck: Normal range of motion. Neck supple. Carotid bruit is not present.  Cardiovascular: Normal rate, regular rhythm, normal heart sounds, intact distal pulses and normal pulses.   Pulmonary/Chest: Breath sounds normal. No respiratory distress.  Abdominal: Soft. Bowel sounds are normal. There is no tenderness. There is no guarding.  Musculoskeletal: Normal range of motion.       Lumbar back: She exhibits pain. She exhibits no deformity.  Lymphadenopathy:       Head (right side): No  submandibular adenopathy present.       Head (left side): No submandibular adenopathy present.    She has no cervical adenopathy.  Neurological: She is alert and oriented to person, place, and time. She has normal strength. No cranial nerve deficit or sensory deficit.  Gait is steady. No gross neurologic deficit appreciated. No motor or sensory deficits appreciated.  Skin: Skin is warm and dry.  Psychiatric: She has a normal mood and affect. Her speech is normal.  Nursing note and vitals reviewed.   ED Course  Procedures (including critical care time) Labs Review Labs Reviewed - No data to display  Imaging Review No results found. I have personally reviewed and evaluated these images and lab results as part of my medical decision-making.   EKG Interpretation None      MDM  Examination favors muscle strain. There is probably an aggravation of the previous injury. The patient will be treated with heating pad, diclofenac and Robaxin. Patient is to follow with her primary physician for additional evaluation if not improving.    Final diagnoses:  None    **I have reviewed nursing notes, vital signs, and all appropriate lab and imaging results for this patient.Lily Kocher, PA-C 10/14/14 [place], MD 10/15/14 [phone]

## 2014-10-14 NOTE — ED Notes (Signed)
Low back pain,  Radiating down into buttock,  Hx of injury in 2014,

## 2014-10-29 ENCOUNTER — Emergency Department (HOSPITAL_COMMUNITY)
Admission: EM | Admit: 2014-10-29 | Discharge: 2014-10-29 | Disposition: A | Discharge status: Home / Self Care | Payer: Medicaid - Out of State | Attending: Emergency Medicine | Admitting: Emergency Medicine

## 2014-10-29 ENCOUNTER — Encounter (HOSPITAL_COMMUNITY): Payer: Self-pay | Admitting: Emergency Medicine

## 2014-10-29 DIAGNOSIS — Y9389 Activity, other specified: Secondary | ICD-10-CM | POA: Diagnosis not present

## 2014-10-29 DIAGNOSIS — Z79899 Other long term (current) drug therapy: Secondary | ICD-10-CM | POA: Insufficient documentation

## 2014-10-29 DIAGNOSIS — S20369A Insect bite (nonvenomous) of unspecified front wall of thorax, initial encounter: Secondary | ICD-10-CM | POA: Diagnosis not present

## 2014-10-29 DIAGNOSIS — Z791 Long term (current) use of non-steroidal anti-inflammatories (NSAID): Secondary | ICD-10-CM | POA: Insufficient documentation

## 2014-10-29 DIAGNOSIS — S40262A Insect bite (nonvenomous) of left shoulder, initial encounter: Secondary | ICD-10-CM | POA: Insufficient documentation

## 2014-10-29 DIAGNOSIS — S1086XA Insect bite of other specified part of neck, initial encounter: Secondary | ICD-10-CM | POA: Insufficient documentation

## 2014-10-29 DIAGNOSIS — W57XXXA Bitten or stung by nonvenomous insect and other nonvenomous arthropods, initial encounter: Secondary | ICD-10-CM | POA: Diagnosis not present

## 2014-10-29 DIAGNOSIS — G8929 Other chronic pain: Secondary | ICD-10-CM

## 2014-10-29 DIAGNOSIS — Y998 Other external cause status: Secondary | ICD-10-CM | POA: Insufficient documentation

## 2014-10-29 DIAGNOSIS — Y9289 Other specified places as the place of occurrence of the external cause: Secondary | ICD-10-CM | POA: Diagnosis not present

## 2014-10-29 DIAGNOSIS — M549 Dorsalgia, unspecified: Secondary | ICD-10-CM

## 2014-10-29 DIAGNOSIS — M545 Low back pain: Secondary | ICD-10-CM | POA: Insufficient documentation

## 2014-10-29 DIAGNOSIS — I10 Essential (primary) hypertension: Secondary | ICD-10-CM | POA: Insufficient documentation

## 2014-10-29 DIAGNOSIS — Z7952 Long term (current) use of systemic steroids: Secondary | ICD-10-CM | POA: Diagnosis not present

## 2014-10-29 MED ORDER — MELOXICAM 7.5 MG PO TABS: 7.5000 mg | ORAL_TABLET | Freq: Every day | ORAL | Status: DC

## 2014-10-29 MED ORDER — METAXALONE 800 MG PO TABS: 800.0000 mg | ORAL_TABLET | Freq: Three times a day (TID) | ORAL | Status: DC

## 2014-10-29 MED ORDER — HYDROCODONE-ACETAMINOPHEN 5-325 MG PO TABS: 1.0000 | ORAL_TABLET | ORAL | Status: DC | PRN

## 2014-10-29 NOTE — ED Notes (Signed)
Insect bite to left shoulder, left chest and back of neck.  C/o lower back pain, rates pain 9/10.

## 2014-10-29 NOTE — Discharge Instructions (Signed)
Back Pain, Adult Low back pain is very common. About 1 in 5 people have back pain.The cause of low back pain is rarely dangerous. The pain often gets better over time.About half of people with a sudden onset of back pain feel better in just 2 weeks. About 8 in 10 people feel better by 6 weeks.  CAUSES Some common causes of back pain include:  Strain of the muscles or ligaments supporting the spine.  Wear and tear (degeneration) of the spinal discs.  Arthritis.  Direct injury to the back. DIAGNOSIS Most of the time, the direct cause of low back pain is not known.However, back pain can be treated effectively even when the exact cause of the pain is unknown.Answering your caregiver's questions about your overall health and symptoms is one of the most accurate ways to make sure the cause of your pain is not dangerous. If your caregiver needs more information, he or she may order lab work or imaging tests (X-rays or MRIs).However, even if imaging tests show changes in your back, this usually does not require surgery. HOME CARE INSTRUCTIONS For many people, back pain returns.Since low back pain is rarely dangerous, it is often a condition that people can learn to [facility] their own.   Remain active. It is stressful on the back to sit or stand in one place. Do not sit, drive, or stand in one place for more than 30 minutes at a time. Take short walks on level surfaces as soon as pain allows.Try to increase the length of time you walk each day.  Do not stay in bed.Resting more than 1 or 2 days can delay your recovery.  Do not avoid exercise or work.Your body is made to move.It is not dangerous to be active, even though your back may hurt.Your back will likely heal faster if you return to being active before your pain is gone.  Pay attention to your body when you bend and lift. Many people have less discomfortwhen lifting if they bend their knees, keep the load close to their bodies,and  avoid twisting. Often, the most comfortable positions are those that put less stress on your recovering back.  Find a comfortable position to sleep. Use a firm mattress and lie on your side with your knees slightly bent. If you lie on your back, put a pillow under your knees.  Only take over-the-counter or prescription medicines as directed by your caregiver. Over-the-counter medicines to reduce pain and inflammation are often the most helpful.Your caregiver may prescribe muscle relaxant drugs.These medicines help dull your pain so you can more quickly return to your normal activities and healthy exercise.  Put ice on the injured area.  Put ice in a plastic bag.  Place a towel between your skin and the bag.  Leave the ice on for 15-20 minutes, 03-04 times a day for the first 2 to 3 days. After that, ice and heat may be alternated to reduce pain and spasms.  Ask your caregiver about trying back exercises and gentle massage. This may be of some benefit.  Avoid feeling anxious or stressed.Stress increases muscle tension and can worsen back pain.It is important to recognize when you are anxious or stressed and learn ways to manage it.Exercise is a great option. SEEK MEDICAL CARE IF:  You have pain that is not relieved with rest or medicine.  You have pain that does not improve in 1 week.  You have new symptoms.  You are generally not feeling well. SEEK  IMMEDIATE MEDICAL CARE IF:   You have pain that radiates from your back into your legs.  You develop new bowel or bladder control problems.  You have unusual weakness or numbness in your arms or legs.  You develop nausea or vomiting.  You develop abdominal pain.  You feel faint. Document Released: 01/24/2005 Document Revised: 07/26/2011 Document Reviewed: 05/28/2013 [facility] Patient Information 2015 [place], [place]. This information is not intended to replace advice given to you by your health care provider. Make sure you  discuss any questions you have with your health care provider.  Bedbugs Bedbugs are tiny bugs that live in and around beds. During the day, they hide in mattresses and other places near beds. They come out at night and bite people lying in bed. They need blood to live and grow. Bedbugs can be found in beds anywhere. Usually, they are found in places where many people come and go (hotels, shelters, hospitals). It does not matter whether the place is dirty or clean. Getting bitten by bedbugs rarely causes a medical problem. The biggest problem can be getting rid of them. This often takes the work of a Financial risk analyst. CAUSES  Less use of pesticides. Bedbugs were common before the 1950s. Then, strong pesticides such as DDT nearly wiped them out. Today, these pesticides are not used because they harm the environment and can cause health problems.  More travel. Besides mattresses, bedbugs can also live in clothing and luggage. They can come along as people travel from place to place. Bedbugs are more common in certain parts of the world. When people travel to those areas, the bugs can come home with them.  Presence of birds and bats. Bedbugs often infest birds and bats. If you have these animals in or near your home, bedbugs may infest your house, too. SYMPTOMS It does not hurt to be bitten by a bedbug. You will probably not wake up when you are bitten. Bedbugs usually bite areas of the skin that are not covered. Symptoms may show when you wake up, or they may take a day or more to show up. Symptoms may include:  Small red bumps on the skin. These might be lined up in a row or clustered in a group.  A darker red dot in the middle of red bumps.  Blisters on the skin. There may be swelling and very bad itching. These may be signs of an allergic reaction. This does not happen often. DIAGNOSIS Bedbug bites might look and feel like other types of insect bites. The bugs do not stay on the body like  ticks or lice. They bite, drop off, and crawl away to hide. Your caregiver will probably:  Ask about your symptoms.  Ask about your recent activities and travel.  Check your skin for bedbug bites.  Ask you to check at home for signs of bedbugs. You should look for:  Spots or stains on the bed or nearby. This could be from bedbugs that were crushed or from their eggs or waste.  Bedbugs themselves. They are reddish-brown, oval, and flat. They do not fly. They are about the size of an apple seed.  Places to look for bedbugs include:  Beds. Check mattresses, headboards, box springs, and bed frames.  On drapes and curtains near the bed.  Under carpeting in the bedroom.  Behind electrical outlets.  Behind any wallpaper that is peeling.  Inside luggage. TREATMENT Most bedbug bites do not need treatment. They usually go away  on their own in a few days. The bites are not dangerous. However, treatment may be needed if you have scratched so much that your skin has become infected. You may also need treatment if you are allergic to bedbug bites. Treatment options include:  A drug that stops swelling and itching (corticosteroid). Usually, a cream is rubbed on the skin. If you have a bad rash, you may be given a corticosteroid pill.  Oral antihistamines. These are pills to help control itching.  Antibiotic medicines. An antibiotic may be prescribed for infected skin. HOME CARE INSTRUCTIONS   Take any medicine prescribed by your caregiver for your bites. Follow the directions carefully.  Consider wearing pajamas with long sleeves and pant legs.  Your bedroom may need to be treated. A pest control expert should make sure the bedbugs are gone. You may need to throw away mattresses or luggage. Ask the pest control expert what you can do to keep the bedbugs from coming back. Common suggestions include:  Putting a plastic cover over your mattress.  Washing and drying your clothes and bedding  in hot water and a hot dryer. The temperature should be hotter than 120 F (48.9 C). Bedbugs are killed by high temperatures.  Vacuuming carefully all around your bed. Vacuum in all cracks and crevices where the bugs might hide. Do this often.  Carefully checking all used furniture, bedding, or clothes that you bring into your house.  Eliminating bird nests and bat roosts.  If you get bedbug bites when traveling, check all your possessions carefully before bringing them into your house. If you find any bugs on clothes or in your luggage, consider throwing those items away. SEEK MEDICAL CARE IF:  You have red bug bites that keep coming back.  You have red bug bites that itch badly.  You have bug bites that cause a skin rash.  You have scratch marks that are red and sore. SEEK IMMEDIATE MEDICAL CARE IF: You have a fever. Document Released: 02/26/2010 Document Revised: 04/18/2011 Document Reviewed: 02/26/2010 [facility] Patient Information 2015 [place], [place]. This information is not intended to replace advice given to you by your health care provider. Make sure you discuss any questions you have with your health care provider.

## 2014-10-31 NOTE — ED Provider Notes (Signed)
CSN: [id number]     Arrival date & time 10/29/14  1637 History   First MD Initiated Contact with Patient 10/29/14 1824     Chief Complaint  Patient presents with  . Insect Bite  . Back Pain     (Consider location/radiation/quality/duration/timing/severity/associated sxs/prior Treatment) The history is provided by the patient.   Diana Fox is a 44 y.o. female presenting with 2 complaints, the first being persistent low back pain, initially occuring with a lifting injury from her job 2 years ago, but has been constant and persistent since march of this year.  She describes a constant aching pressure bilateral lower back which no longer goes away with rest or various prescribed medicines including anti inflammatories and muscle relaxers.  She is waiting for her pcp to refer to a back specialist (in [place]), but states this will not happen until her next appt with the pcp in November.  She denies fevers, chills, dysuria, loss of bowel or bladder control or weakness in her extremities.  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  Since then, she has noticed raised itchy bites around her neck, upper chest and back.  She continues to have itching, but denies any new lesions since she left this clients home.    Past Medical History  Diagnosis Date  . Hypertension   . Back pain    Past Surgical History  Procedure Laterality Date  . Cholecystectomy     History reviewed. No pertinent family history. Social History  Substance Use Topics  . Smoking status: Never Smoker   . Smokeless tobacco: None  . Alcohol Use: No   OB History    No data available     Review of Systems  Constitutional: Negative for fever.  Respiratory: Negative for shortness of breath.   Cardiovascular: Negative for chest pain and leg swelling.  Gastrointestinal: Negative for abdominal pain, constipation and abdominal  distention.  Genitourinary: Negative for dysuria, urgency, frequency, flank pain and difficulty urinating.  Musculoskeletal: Positive for back pain. Negative for joint swelling and gait problem.  Skin: Positive for rash.  Neurological: Negative for weakness and numbness.      Allergies  Motrin and Lisinopril  Home Medications   Prior to Admission medications   Medication Sig Start Date End Date Taking? Authorizing Provider  benzonatate (TESSALON) 100 MG capsule Take 2 capsules (200 mg total) by mouth 3 (three) times daily as needed for cough. Patient not taking: Reported on 04/24/2014 01/22/14   Evalee Jefferson, PA-C  cyclobenzaprine (FLEXERIL) 5 MG tablet Take 1 tablet (5 mg total) by mouth 3 (three) times daily as needed for muscle spasms. 04/24/14   Evalee Jefferson, PA-C  dexamethasone (DECADRON) 4 MG tablet Take 1 tablet (4 mg total) by mouth 2 (two) times daily with a meal. 10/14/14   Lily Kocher, PA-C  diclofenac (VOLTAREN) 75 MG EC tablet Take 1 tablet (75 mg total) by mouth 2 (two) times daily. 10/14/14   Lily Kocher, PA-C  HYDROcodone-acetaminophen (NORCO/VICODIN) 5-325 MG per tablet Take 1 tablet by mouth every 4 (four) hours as needed. 10/29/14   Evalee Jefferson, PA-C  meloxicam (MOBIC) 7.5 MG tablet Take 1 tablet (7.5 mg total) by mouth daily. 10/29/14   Evalee Jefferson, PA-C  metaxalone (SKELAXIN) 800 MG tablet Take 1 tablet (800 mg total) by mouth 3 (three) times daily. 10/29/14   Evalee Jefferson, PA-C  methocarbamol (ROBAXIN) 500 MG  tablet Take 1 tablet (500 mg total) by mouth 3 (three) times daily. 10/14/14   Lily Kocher, PA-C  naproxen (NAPROSYN) 500 MG tablet Take 1 tablet (500 mg total) by mouth 2 (two) times daily. 04/24/14   Evalee Jefferson, PA-C  nebivolol (BYSTOLIC) 10 MG tablet Take 10 mg by mouth daily.    Historical Provider, MD  predniSONE (DELTASONE) 10 MG tablet 6, 5, 4, 3, 2 then 1 tablet by mouth daily for 6 days total. Patient not taking: Reported on 04/24/2014 01/22/14   Evalee Jefferson, PA-C    BP 143/78 mmHg  Pulse 76  Temp(Src) 98.1 F (36.7 C) (Temporal)  Resp 20  Ht 5\' 5"  (1.651 m)  Wt 150 lb (68.04 kg)  BMI 24.96 kg/m2  SpO2 100%  LMP 10/13/2014 Physical Exam  Constitutional: She appears well-developed and well-nourished.  HENT:  Head: Normocephalic.  Eyes: Conjunctivae are normal.  Neck: Normal range of motion. Neck supple.  Cardiovascular: Normal rate and intact distal pulses.   Pedal pulses normal.  Pulmonary/Chest: Effort normal.  Abdominal: Soft. Bowel sounds are normal. She exhibits no distension and no mass.  Musculoskeletal: Normal range of motion. She exhibits no edema.       Lumbar back: She exhibits tenderness. She exhibits no swelling, no edema and no spasm.  Neurological: She is alert. She has normal strength. She displays no atrophy and no tremor. No sensory deficit. Gait normal.  Reflex Scores:      Patellar reflexes are 2+ on the right side and 2+ on the left side.      Achilles reflexes are 2+ on the right side and 2+ on the left side. No strength deficit noted in hip and knee flexor and extensor muscle groups.  Ankle flexion and extension intact.  Skin: Skin is warm and dry. Rash noted.  Several slightly raised papules upper chest, left shoulder and neck consistent with insect bite.  Psychiatric: She has a normal mood and affect.  Nursing note and vitals reviewed.   ED Course  Procedures (including critical care time)    Labs Review Labs Reviewed - No data to display  Imaging Review No results found. I have personally reviewed and evaluated these images and lab results as part of my medical decision-making.   EKG Interpretation None      MDM   Final diagnoses:  Bedbug bite  Chronic back pain    During ED visit and interview, a small insect was found inside her scrub top.  Suspect this was a bedbug.  She was given paper scrubs to change into and advised to use caution with bringing her clothing inside her home until she is  sure there are no further insects.  Encouraged Benadryl as needed for itching.  She was also prescribed Skelaxin, Mobic and a few hydrocodone for her low back pain.  Encouraged to keep her appointment with her PCP in November, sooner if possible so she can get plugged in with a back specialist which is the current plan.  The patient appears reasonably screened and/or stabilized for discharge and I doubt any other medical condition or other [facility] requiring further screening, evaluation, or treatment in the ED at this time prior to discharge.   Evalee Jefferson, PA-C 10/31/14 1857  Dorie Rank, MD 11/03/14 [phone]

## 2015-02-04 ENCOUNTER — Encounter (HOSPITAL_COMMUNITY): Payer: Self-pay | Admitting: Emergency Medicine

## 2015-02-04 ENCOUNTER — Emergency Department (HOSPITAL_COMMUNITY)
Admission: EM | Admit: 2015-02-04 | Discharge: 2015-02-04 | Disposition: A | Discharge status: Home / Self Care | Payer: Medicaid - Out of State | Attending: Emergency Medicine | Admitting: Emergency Medicine

## 2015-02-04 DIAGNOSIS — Z79899 Other long term (current) drug therapy: Secondary | ICD-10-CM | POA: Diagnosis not present

## 2015-02-04 DIAGNOSIS — J069 Acute upper respiratory infection, unspecified: Secondary | ICD-10-CM

## 2015-02-04 DIAGNOSIS — H9203 Otalgia, bilateral: Secondary | ICD-10-CM | POA: Insufficient documentation

## 2015-02-04 DIAGNOSIS — J029 Acute pharyngitis, unspecified: Secondary | ICD-10-CM | POA: Diagnosis present

## 2015-02-04 DIAGNOSIS — I10 Essential (primary) hypertension: Secondary | ICD-10-CM | POA: Insufficient documentation

## 2015-02-04 DIAGNOSIS — Z791 Long term (current) use of non-steroidal anti-inflammatories (NSAID): Secondary | ICD-10-CM | POA: Insufficient documentation

## 2015-02-04 DIAGNOSIS — Z7952 Long term (current) use of systemic steroids: Secondary | ICD-10-CM | POA: Diagnosis not present

## 2015-02-04 DIAGNOSIS — H748X1 Other specified disorders of right middle ear and mastoid: Secondary | ICD-10-CM | POA: Insufficient documentation

## 2015-02-04 LAB — RAPID STREP SCREEN (MED CTR MEBANE ONLY): Streptococcus, Group A Screen (Direct): NEGATIVE

## 2015-02-04 MED ORDER — GUAIFENESIN-CODEINE 100-10 MG/5ML PO SYRP: 10.0000 mL | ORAL_SOLUTION | Freq: Three times a day (TID) | ORAL | Status: DC | PRN

## 2015-02-04 MED ORDER — PREDNISONE 10 MG PO TABS: ORAL_TABLET | ORAL | Status: DC

## 2015-02-04 NOTE — ED Notes (Addendum)
Patient complaining of pain to back, right ear, and throat x 5 days. States she was treated at Urgent Care for ear and throat pain "but they gave me the wrong medicine. They gave me sudafed and I can't take that because of my blood pressure."

## 2015-02-04 NOTE — Discharge Instructions (Signed)

## 2015-02-06 LAB — CULTURE, GROUP A STREP: Strep A Culture: NEGATIVE

## 2015-02-07 NOTE — ED Provider Notes (Signed)
CSN: [id number]     Arrival date & time 02/04/15  1021 History   First MD Initiated Contact with Patient 02/04/15 1104     Chief Complaint  Patient presents with  . Back Pain  . Sore Throat  . Otalgia     (Consider location/radiation/quality/duration/timing/severity/associated sxs/prior Treatment) HPI   Diana Fox is a 44 y.o. female who presents to the Emergency Department complaining of sore throat, ear pain, and nasal congestion for 5 days.  She states she was tx at a local urgent care and told to take sudafed which she states she can't take because it elevates her blood pressure.  She denies fever, persistent cough, chills, CP , shortness of breath, or abdominal pain.  Past Medical History  Diagnosis Date  . Hypertension   . Back pain    Past Surgical History  Procedure Laterality Date  . Cholecystectomy     History reviewed. No pertinent family history. Social History  Substance Use Topics  . Smoking status: Never Smoker   . Smokeless tobacco: None  . Alcohol Use: No   OB History    No data available     Review of Systems  Constitutional: Negative for fever, chills, activity change and appetite change.  HENT: Positive for congestion, ear pain, rhinorrhea and sore throat. Negative for facial swelling and trouble swallowing.   Eyes: Negative for visual disturbance.  Respiratory: Negative for cough, shortness of breath, wheezing and stridor.   Gastrointestinal: Negative for nausea, vomiting and abdominal pain.  Genitourinary: Negative for dysuria.  Musculoskeletal: Negative for back pain, neck pain and neck stiffness.  Skin: Negative.  Negative for rash.  Neurological: Negative for dizziness, weakness, numbness and headaches.  Hematological: Negative for adenopathy.  Psychiatric/Behavioral: Negative for confusion.  All other systems reviewed and are negative.     Allergies  Motrin; Lisinopril; and Sudafed  Home Medications   Prior to Admission  medications   Medication Sig Start Date End Date Taking? Authorizing Provider  cyclobenzaprine (FLEXERIL) 5 MG tablet Take 1 tablet (5 mg total) by mouth 3 (three) times daily as needed for muscle spasms. 04/24/14   Evalee Jefferson, PA-C  dexamethasone (DECADRON) 4 MG tablet Take 1 tablet (4 mg total) by mouth 2 (two) times daily with a meal. 10/14/14   Lily Kocher, PA-C  diclofenac (VOLTAREN) 75 MG EC tablet Take 1 tablet (75 mg total) by mouth 2 (two) times daily. 10/14/14   Lily Kocher, PA-C  guaiFENesin-codeine (ROBITUSSIN AC) 100-10 MG/5ML syrup Take 10 mLs by mouth 3 (three) times daily as needed. 02/04/15   Raahim Shartzer, PA-C  HYDROcodone-acetaminophen (NORCO/VICODIN) 5-325 MG per tablet Take 1 tablet by mouth every 4 (four) hours as needed. 10/29/14   Evalee Jefferson, PA-C  meloxicam (MOBIC) 7.5 MG tablet Take 1 tablet (7.5 mg total) by mouth daily. 10/29/14   Evalee Jefferson, PA-C  metaxalone (SKELAXIN) 800 MG tablet Take 1 tablet (800 mg total) by mouth 3 (three) times daily. 10/29/14   Evalee Jefferson, PA-C  methocarbamol (ROBAXIN) 500 MG tablet Take 1 tablet (500 mg total) by mouth 3 (three) times daily. 10/14/14   Lily Kocher, PA-C  naproxen (NAPROSYN) 500 MG tablet Take 1 tablet (500 mg total) by mouth 2 (two) times daily. 04/24/14   Evalee Jefferson, PA-C  nebivolol (BYSTOLIC) 10 MG tablet Take 10 mg by mouth daily.    Historical Provider, MD  predniSONE (DELTASONE) 10 MG tablet Take 6 tablets day one, 5 tablets day two, 4 tablets day three, 3  tablets day four, 2 tablets day five, then 1 tablet day six 02/04/15   Jadaya Sommerfield, PA-C   BP 145/81 mmHg  Pulse 92  Temp(Src) 99.1 F (37.3 C) (Oral)  Resp 16  Ht 5\' 5"  (1.651 m)  Wt 68.04 kg  BMI 24.96 kg/m2  SpO2 100%  LMP 01/11/2015 Physical Exam  Constitutional: She is oriented to person, place, and time. She appears well-developed and well-nourished. No distress.  HENT:  Head: Normocephalic and atraumatic.  Right Ear: Ear canal normal. There is  tenderness. Tympanic membrane is not erythematous. A middle ear effusion is present.  Left Ear: Tympanic membrane and ear canal normal. There is tenderness. Tympanic membrane is not erythematous.  Nose: Mucosal edema and rhinorrhea present.  Mouth/Throat: Uvula is midline and mucous membranes are normal. No trismus in the jaw. No uvula swelling. Posterior oropharyngeal erythema present. No oropharyngeal exudate, posterior oropharyngeal edema or tonsillar abscesses.  Eyes: Conjunctivae are normal.  Neck: Normal range of motion and phonation normal. Neck supple. No Brudzinski's sign and no Kernig's sign noted.  Cardiovascular: Normal rate, regular rhythm, normal heart sounds and intact distal pulses.   No murmur heard. Pulmonary/Chest: Effort normal and breath sounds normal. No respiratory distress. She has no wheezes. She has no rales.  Abdominal: Soft. She exhibits no distension. There is no tenderness. There is no rebound and no guarding.  Musculoskeletal: She exhibits no edema.  Lymphadenopathy:    She has no cervical adenopathy.  Neurological: She is alert and oriented to person, place, and time. She exhibits normal muscle tone. Coordination normal.  Skin: Skin is warm and dry.  Nursing note and vitals reviewed.   ED Course  Procedures (including critical care time) Labs Review Labs Reviewed  RAPID STREP SCREEN (NOT AT [facility])  CULTURE, GROUP A STREP    Imaging Review No results found. I have personally reviewed and evaluated these images and lab results as part of my medical decision-making.   EKG Interpretation None      MDM   Final diagnoses:  Otalgia of both ears  URI (upper respiratory infection)    Pt is well appearing.  Vitals stable.  Sx's likely viral.  Pt agrees to symptomatic tx and close f/u with her PMD      Kem Parkinson, PA-C 02/07/15 [place], MD 02/08/15 [phone]

## 2015-06-02 ENCOUNTER — Encounter (HOSPITAL_COMMUNITY): Payer: Self-pay | Admitting: *Deleted

## 2015-06-02 ENCOUNTER — Emergency Department (HOSPITAL_COMMUNITY)
Admission: EM | Admit: 2015-06-02 | Discharge: 2015-06-02 | Disposition: A | Discharge status: Home / Self Care | Payer: Medicaid - Out of State | Attending: Emergency Medicine | Admitting: Emergency Medicine

## 2015-06-02 DIAGNOSIS — Y999 Unspecified external cause status: Secondary | ICD-10-CM | POA: Diagnosis not present

## 2015-06-02 DIAGNOSIS — M545 Low back pain: Secondary | ICD-10-CM | POA: Diagnosis present

## 2015-06-02 DIAGNOSIS — S39012A Strain of muscle, fascia and tendon of lower back, initial encounter: Secondary | ICD-10-CM

## 2015-06-02 DIAGNOSIS — Y929 Unspecified place or not applicable: Secondary | ICD-10-CM | POA: Diagnosis not present

## 2015-06-02 DIAGNOSIS — I10 Essential (primary) hypertension: Secondary | ICD-10-CM | POA: Insufficient documentation

## 2015-06-02 DIAGNOSIS — X58XXXA Exposure to other specified factors, initial encounter: Secondary | ICD-10-CM | POA: Insufficient documentation

## 2015-06-02 DIAGNOSIS — M79671 Pain in right foot: Secondary | ICD-10-CM | POA: Insufficient documentation

## 2015-06-02 DIAGNOSIS — Y939 Activity, unspecified: Secondary | ICD-10-CM | POA: Insufficient documentation

## 2015-06-02 MED ORDER — DICLOFENAC SODIUM 75 MG PO TBEC: 75.0000 mg | DELAYED_RELEASE_TABLET | Freq: Two times a day (BID) | ORAL | Status: DC

## 2015-06-02 MED ORDER — HYDROCODONE-ACETAMINOPHEN 5-325 MG PO TABS: ORAL_TABLET | ORAL | Status: DC

## 2015-06-02 MED ORDER — METHOCARBAMOL 500 MG PO TABS: 500.0000 mg | ORAL_TABLET | Freq: Three times a day (TID) | ORAL | Status: DC

## 2015-06-02 NOTE — ED Notes (Signed)
Pt made aware to return if symptoms worsen or if any life threatening symptoms occur.   

## 2015-06-02 NOTE — Discharge Instructions (Signed)

## 2015-06-02 NOTE — ED Notes (Signed)
Pt comes in with lower back pain and upper back pain on the left side. Pt states she woke up with this pain. States her pain increases with movement.   Pt also has pain on the top of her right foot. Denies any injury

## 2015-06-04 NOTE — ED Provider Notes (Signed)
CSN: [id number]     Arrival date & time 06/02/15  1715 History   First MD Initiated Contact with Patient 06/02/15 1722     Chief Complaint  Patient presents with  . Back Pain  . Foot Pain     (Consider location/radiation/quality/duration/timing/severity/associated sxs/prior Treatment) HPI   Diana Fox is a 45 y.o. female who presents to the Emergency Department complaining of left sided low back pain, onset earlier on the day of arrival.  She describes an aching pain that is worse with movement.  Pain increased after working.  Pain improves with rest.  She also noticed pain to the top of her right foot for several days.  Pain is reproduced with walking.  She has not taken any medications for symptom relief.  She denies numbness or weakness of the extremities, fever, chills, urine or bowel changes, abdominal pain   Past Medical History  Diagnosis Date  . Hypertension   . Back pain    Past Surgical History  Procedure Laterality Date  . Cholecystectomy     No family history on file. Social History  Substance Use Topics  . Smoking status: Never Smoker   . Smokeless tobacco: None  . Alcohol Use: No   OB History    No data available     Review of Systems  Constitutional: Negative for fever.  Respiratory: Negative for shortness of breath.   Gastrointestinal: Negative for vomiting, abdominal pain and constipation.  Genitourinary: Negative for dysuria, hematuria, flank pain, decreased urine volume and difficulty urinating.  Musculoskeletal: Positive for back pain and arthralgias (right foot pain). Negative for joint swelling.  Skin: Negative for rash.  Neurological: Negative for weakness and numbness.  All other systems reviewed and are negative.     Allergies  Motrin; Lisinopril; and Sudafed  Home Medications   Prior to Admission medications   Medication Sig Start Date End Date Taking? Authorizing Provider  cyclobenzaprine (FLEXERIL) 5 MG tablet Take 1 tablet  (5 mg total) by mouth 3 (three) times daily as needed for muscle spasms. 04/24/14   Evalee Jefferson, PA-C  dexamethasone (DECADRON) 4 MG tablet Take 1 tablet (4 mg total) by mouth 2 (two) times daily with a meal. 10/14/14   Lily Kocher, PA-C  diclofenac (VOLTAREN) 75 MG EC tablet Take 1 tablet (75 mg total) by mouth 2 (two) times daily. Take with food 06/02/15   Rebekah Zackery, PA-C  guaiFENesin-codeine (ROBITUSSIN AC) 100-10 MG/5ML syrup Take 10 mLs by mouth 3 (three) times daily as needed. 02/04/15   Hank Walling, PA-C  HYDROcodone-acetaminophen (NORCO/VICODIN) 5-325 MG tablet Take one tab po q 4-6 hrs prn pain 06/02/15   Benedicto Capozzi, PA-C  meloxicam (MOBIC) 7.5 MG tablet Take 1 tablet (7.5 mg total) by mouth daily. 10/29/14   Evalee Jefferson, PA-C  metaxalone (SKELAXIN) 800 MG tablet Take 1 tablet (800 mg total) by mouth 3 (three) times daily. 10/29/14   Evalee Jefferson, PA-C  methocarbamol (ROBAXIN) 500 MG tablet Take 1 tablet (500 mg total) by mouth 3 (three) times daily. 06/02/15   Izamar Linden, PA-C  naproxen (NAPROSYN) 500 MG tablet Take 1 tablet (500 mg total) by mouth 2 (two) times daily. 04/24/14   Evalee Jefferson, PA-C  nebivolol (BYSTOLIC) 10 MG tablet Take 10 mg by mouth daily.    Historical Provider, MD  predniSONE (DELTASONE) 10 MG tablet Take 6 tablets day one, 5 tablets day two, 4 tablets day three, 3 tablets day four, 2 tablets day five, then 1 tablet day  six 02/04/15   Roman Sandall, PA-C   BP 149/76 mmHg  Pulse 78  Temp(Src) 98.4 F (36.9 C) (Oral)  Resp 18  Ht 5\' 5"  (1.651 m)  Wt 77.338 kg  BMI 28.37 kg/m2  SpO2 100%  LMP 06/02/2015 Physical Exam  Constitutional: She is oriented to person, place, and time. She appears well-developed and well-nourished. No distress.  HENT:  Head: Normocephalic and atraumatic.  Neck: Normal range of motion. Neck supple.  Cardiovascular: Normal rate, regular rhythm, normal heart sounds and intact distal pulses.   No murmur heard. Pulmonary/Chest: Effort  normal and breath sounds normal. No respiratory distress.  Abdominal: Soft. She exhibits no distension. There is no tenderness.  Musculoskeletal: She exhibits tenderness. She exhibits no edema.       Lumbar back: She exhibits tenderness and pain. She exhibits normal range of motion, no swelling, no deformity, no laceration and normal pulse.  ttp of the left lumbar paraspinal muscles.  No spinal tenderness.  DP pulses are brisk and symmetrical.  Distal sensation intact.  Pt has 5/5 strength against resistance of bilateral lower extremities.  Focal tenderness to palpation the dorsal right foot without edema, erythema.    Neurological: She is alert and oriented to person, place, and time. She has normal strength. No sensory deficit. She exhibits normal muscle tone. Coordination and gait normal.  Reflex Scores:      Patellar reflexes are 2+ on the right side and 2+ on the left side.      Achilles reflexes are 2+ on the right side and 2+ on the left side. Skin: Skin is warm and dry. No rash noted.  Nursing note and vitals reviewed.   ED Course  Procedures (including critical care time) Labs Review Labs Reviewed - No data to display  Imaging Review No results found. I have personally reviewed and evaluated these images and lab results as part of my medical decision-making.   EKG Interpretation None      MDM   Final diagnoses:  Lumbar strain, initial encounter  Foot pain, right    Pt ambulates with a steady gait.  No focal neurological deficits.  Ambulates in the dept with a steady gait.  Likely musculoskeletal.  nml appearing right foot.  No radicular pain.  Pt appears stable for d/c and agrees to symptomatic tx and PMD f/u if needed.      Kem Parkinson, PA-C 06/04/15 0050  Fredia Sorrow, MD 06/08/15 1655

## 2015-08-10 ENCOUNTER — Emergency Department (HOSPITAL_COMMUNITY)
Admission: EM | Admit: 2015-08-10 | Discharge: 2015-08-10 | Disposition: A | Discharge status: Home / Self Care | Payer: Medicaid - Out of State | Attending: Emergency Medicine | Admitting: Emergency Medicine

## 2015-08-10 ENCOUNTER — Encounter (HOSPITAL_COMMUNITY): Payer: Self-pay | Admitting: *Deleted

## 2015-08-10 DIAGNOSIS — Z79899 Other long term (current) drug therapy: Secondary | ICD-10-CM | POA: Diagnosis not present

## 2015-08-10 DIAGNOSIS — M545 Low back pain: Secondary | ICD-10-CM | POA: Insufficient documentation

## 2015-08-10 DIAGNOSIS — I10 Essential (primary) hypertension: Secondary | ICD-10-CM | POA: Insufficient documentation

## 2015-08-10 DIAGNOSIS — M7918 Myalgia, other site: Secondary | ICD-10-CM

## 2015-08-10 MED ORDER — DICLOFENAC SODIUM 75 MG PO TBEC: 75.0000 mg | DELAYED_RELEASE_TABLET | Freq: Two times a day (BID) | ORAL | Status: DC

## 2015-08-10 MED ORDER — METHOCARBAMOL 500 MG PO TABS: 500.0000 mg | ORAL_TABLET | Freq: Three times a day (TID) | ORAL | Status: DC

## 2015-08-10 MED ORDER — NEBIVOLOL HCL 10 MG PO TABS: 10.0000 mg | ORAL_TABLET | Freq: Every day | ORAL | Status: DC

## 2015-08-10 MED ORDER — DEXAMETHASONE 4 MG PO TABS: 4.0000 mg | ORAL_TABLET | Freq: Two times a day (BID) | ORAL | Status: DC

## 2015-08-10 NOTE — ED Provider Notes (Signed)
CSN: [id number]     Arrival date & time 08/10/15  1223 History  By signing my name below, I, Georgette Shell, attest that this documentation has been prepared under the direction and in the presence of Lily Kocher, PA-C. Electronically Signed: Georgette Shell, ED Scribe. 08/10/2015. 1:44 PM.   Chief Complaint  Patient presents with  . Back Pain   The history is provided by the patient. No language interpreter was used.    HPI Comments: Diana Fox is a 45 y.o. female who presents to the Emergency Department complaining of intermittent, waxing and waning lower back pain onset 3 days ago. Pt states she woke up and felt the pain. Pt notes that she had an accident in 2014 and states that she was diagnosed with a lumbar sprain and has had back pain ever since but this is worse than baseline. Pt has a regular orthopedic doctor. Pt denies chance of being pregnant and she is currently not breast feeding. Pt denies any recent injury.   Past Medical History  Diagnosis Date  . Hypertension   . Back pain    Past Surgical History  Procedure Laterality Date  . Cholecystectomy     No family history on file. Social History  Substance Use Topics  . Smoking status: Never Smoker   . Smokeless tobacco: None  . Alcohol Use: No   OB History    No data available     Review of Systems  Constitutional: Negative for fever.  Musculoskeletal: Positive for back pain. Negative for gait problem.  All other systems reviewed and are negative.   Allergies  Motrin; Lisinopril; and Sudafed  Home Medications   Prior to Admission medications   Medication Sig Start Date End Date Taking? Authorizing Provider  cyclobenzaprine (FLEXERIL) 5 MG tablet Take 1 tablet (5 mg total) by mouth 3 (three) times daily as needed for muscle spasms. 04/24/14   Evalee Jefferson, PA-C  dexamethasone (DECADRON) 4 MG tablet Take 1 tablet (4 mg total) by mouth 2 (two) times daily with a meal. 10/14/14   Lily Kocher, PA-C  diclofenac  (VOLTAREN) 75 MG EC tablet Take 1 tablet (75 mg total) by mouth 2 (two) times daily. Take with food 06/02/15   Tammy Triplett, PA-C  guaiFENesin-codeine (ROBITUSSIN AC) 100-10 MG/5ML syrup Take 10 mLs by mouth 3 (three) times daily as needed. 02/04/15   Tammy Triplett, PA-C  HYDROcodone-acetaminophen (NORCO/VICODIN) 5-325 MG tablet Take one tab po q 4-6 hrs prn pain 06/02/15   Tammy Triplett, PA-C  meloxicam (MOBIC) 7.5 MG tablet Take 1 tablet (7.5 mg total) by mouth daily. 10/29/14   Evalee Jefferson, PA-C  metaxalone (SKELAXIN) 800 MG tablet Take 1 tablet (800 mg total) by mouth 3 (three) times daily. 10/29/14   Evalee Jefferson, PA-C  methocarbamol (ROBAXIN) 500 MG tablet Take 1 tablet (500 mg total) by mouth 3 (three) times daily. 06/02/15   Tammy Triplett, PA-C  naproxen (NAPROSYN) 500 MG tablet Take 1 tablet (500 mg total) by mouth 2 (two) times daily. 04/24/14   Evalee Jefferson, PA-C  nebivolol (BYSTOLIC) 10 MG tablet Take 10 mg by mouth daily.    Historical Provider, MD  predniSONE (DELTASONE) 10 MG tablet Take 6 tablets day one, 5 tablets day two, 4 tablets day three, 3 tablets day four, 2 tablets day five, then 1 tablet day six 02/04/15   Tammy Triplett, PA-C   BP 137/80 mmHg  Pulse 76  Temp(Src) 98.7 F (37.1 C) (Oral)  Resp 18  Ht  5\' 5"  (1.651 m)  Wt 165 lb (74.844 kg)  BMI 27.46 kg/m2  SpO2 100%  LMP 07/28/2015 Physical Exam  Constitutional: She is oriented to person, place, and time. She appears well-developed and well-nourished.  HENT:  Head: Normocephalic.  Eyes: Conjunctivae are normal.  Cardiovascular: Normal rate.   Pulmonary/Chest: Effort normal. No respiratory distress.  Abdominal: She exhibits no distension.  Musculoskeletal: Normal range of motion. She exhibits tenderness.  Neurological: She is alert and oriented to person, place, and time.  Gait is steady, no foot-drop. Lumbar paraspinal tenderness present to palpation. No palpable step-off of lumbar spine.  Skin: Skin is warm and  dry.  Psychiatric: She has a normal mood and affect. Her behavior is normal.  Nursing note and vitals reviewed.   ED Course  Procedures  DIAGNOSTIC STUDIES: Oxygen Saturation is 100% on RA, normal by my interpretation.    COORDINATION OF CARE: 1:43 PM Discussed treatment plan with pt at bedside which includes Rx and orthopedic follow-up and pt agreed to plan.   MDM  No gross neurologic deficits appreciated on examination. There is lumbar paraspinal area tenderness on examination. The examination favors lumbar muscle strains and pain. Vital signs are within normal limits. There is no evidence for caudal equina, or other emergent changes. Prescription for diclofenac, Robaxin, Decadron given to the patient. Patient is referred to orthopedics for additional evaluation and management.    Final diagnoses:  Lumbar muscle pain    *I have reviewed nursing notes, vital signs, and all appropriate lab and imaging results for this patient.**  **I personally performed the services described in this documentation, which was scribed in my presence. The recorded information has been reviewed and is accurate.Lily Kocher, PA-C 08/11/15 1502  Elnora Morrison, MD 08/11/15 2214

## 2015-08-10 NOTE — Discharge Instructions (Signed)
Your vital signs within normal limits. Please use a heating pad to the lower back. Use the diclofenac, Robaxin, and Decadron as prescribed. Robaxin may cause drowsiness, please use this medication with caution. Please see your orthopedic specialist for an office visit as soon as possible for reevaluation and recheck of your ongoing back pain. Back Pain, Adult Back pain is very common in adults.The cause of back pain is rarely dangerous and the pain often gets better over time.The cause of your back pain may not be known. Some common causes of back pain include:  Strain of the muscles or ligaments supporting the spine.  Wear and tear (degeneration) of the spinal disks.  Arthritis.  Direct injury to the back. For many people, back pain may return. Since back pain is rarely dangerous, most people can learn to manage this condition on their own. HOME CARE INSTRUCTIONS Watch your back pain for any changes. The following actions may help to lessen any discomfort you are feeling:  Remain active. It is stressful on your back to sit or stand in one place for long periods of time. Do not sit, drive, or stand in one place for more than 30 minutes at a time. Take short walks on even surfaces as soon as you are able.Try to increase the length of time you walk each day.  Exercise regularly as directed by your health care provider. Exercise helps your back heal faster. It also helps avoid future injury by keeping your muscles strong and flexible.  Do not stay in bed.Resting more than 1-2 days can delay your recovery.  Pay attention to your body when you bend and lift. The most comfortable positions are those that put less stress on your recovering back. Always use proper lifting techniques, including:  Bending your knees.  Keeping the load close to your body.  Avoiding twisting.  Find a comfortable position to sleep. Use a firm mattress and lie on your side with your knees slightly bent. If you lie  on your back, put a pillow under your knees.  Avoid feeling anxious or stressed.Stress increases muscle tension and can worsen back pain.It is important to recognize when you are anxious or stressed and learn ways to manage it, such as with exercise.  Take medicines only as directed by your health care provider. Over-the-counter medicines to reduce pain and inflammation are often the most helpful.Your health care provider may prescribe muscle relaxant drugs.These medicines help dull your pain so you can more quickly return to your normal activities and healthy exercise.  Apply ice to the injured area:  Put ice in a plastic bag.  Place a towel between your skin and the bag.  Leave the ice on for 20 minutes, 2-3 times a day for the first 2-3 days. After that, ice and heat may be alternated to reduce pain and spasms.  Maintain a healthy weight. Excess weight puts extra stress on your back and makes it difficult to maintain good posture. SEEK MEDICAL CARE IF:  You have pain that is not relieved with rest or medicine.  You have increasing pain going down into the legs or buttocks.  You have pain that does not improve in one week.  You have night pain.  You lose weight.  You have a fever or chills. SEEK IMMEDIATE MEDICAL CARE IF:   You develop new bowel or bladder control problems.  You have unusual weakness or numbness in your arms or legs.  You develop nausea or vomiting.  You  develop abdominal pain.  You feel faint.   This information is not intended to replace advice given to you by your health care provider. Make sure you discuss any questions you have with your health care provider.   Document Released: 01/24/2005 Document Revised: 02/14/2014 Document Reviewed: 05/28/2013 Elsevier Interactive Patient Education [employer].

## 2015-08-10 NOTE — ED Notes (Signed)
Pt comes in for back pain starting this weekend. Pt states she had a lumbar strain 3 years ago. Pt denies any injury. Pt is ambulatory.

## 2015-10-26 ENCOUNTER — Emergency Department (HOSPITAL_COMMUNITY)
Admission: EM | Admit: 2015-10-26 | Discharge: 2015-10-26 | Disposition: A | Discharge status: Home / Self Care | Payer: Self-pay | Attending: Emergency Medicine | Admitting: Emergency Medicine

## 2015-10-26 ENCOUNTER — Emergency Department (HOSPITAL_COMMUNITY): Payer: Self-pay

## 2015-10-26 ENCOUNTER — Encounter (HOSPITAL_COMMUNITY): Payer: Self-pay | Admitting: Emergency Medicine

## 2015-10-26 DIAGNOSIS — J302 Other seasonal allergic rhinitis: Secondary | ICD-10-CM | POA: Insufficient documentation

## 2015-10-26 DIAGNOSIS — I10 Essential (primary) hypertension: Secondary | ICD-10-CM | POA: Insufficient documentation

## 2015-10-26 DIAGNOSIS — Z79899 Other long term (current) drug therapy: Secondary | ICD-10-CM | POA: Insufficient documentation

## 2015-10-26 LAB — COMPREHENSIVE METABOLIC PANEL
ALBUMIN: 4 g/dL (ref 3.5–5.0)
ALK PHOS: 51 U/L (ref 38–126)
ALT: 14 U/L (ref 14–54)
ANION GAP: 8 (ref 5–15)
AST: 14 U/L — AB (ref 15–41)
BUN: 6 mg/dL (ref 6–20)
CALCIUM: 9.2 mg/dL (ref 8.9–10.3)
CO2: 23 mmol/L (ref 22–32)
Chloride: 105 mmol/L (ref 101–111)
Creatinine, Ser: 0.73 mg/dL (ref 0.44–1.00)
GFR calc Af Amer: >60 mL/min (ref >60)
GFR calc non Af Amer: >60 mL/min (ref >60)
GLUCOSE: 91 mg/dL (ref 65–99)
POTASSIUM: 3.8 mmol/L (ref 3.5–5.1)
SODIUM: 136 mmol/L (ref 135–145)
Total Bilirubin: 0.4 mg/dL (ref 0.3–1.2)
Total Protein: 8 g/dL (ref 6.5–8.1)

## 2015-10-26 LAB — URINALYSIS, ROUTINE W REFLEX MICROSCOPIC
BILIRUBIN URINE: NEGATIVE
GLUCOSE, UA: NEGATIVE mg/dL
Ketones, ur: NEGATIVE mg/dL
Nitrite: NEGATIVE
PH: 6 (ref 5.0–8.0)
Protein, ur: NEGATIVE mg/dL
SPECIFIC GRAVITY, URINE: 1.02 (ref 1.005–1.030)

## 2015-10-26 LAB — CBC
HEMATOCRIT: 34 % — AB (ref 36.0–46.0)
HEMOGLOBIN: 10.9 g/dL — AB (ref 12.0–15.0)
MCH: 25.1 pg — AB (ref 26.0–34.0)
MCHC: 32.1 g/dL (ref 30.0–36.0)
MCV: 78.3 fL (ref 78.0–100.0)
Platelets: 429 10*3/uL — ABNORMAL HIGH (ref 150–400)
RBC: 4.34 MIL/uL (ref 3.87–5.11)
RDW: 18.3 % — ABNORMAL HIGH (ref 11.5–15.5)
WBC: 8 10*3/uL (ref 4.0–10.5)

## 2015-10-26 LAB — LIPASE, BLOOD: Lipase: 20 U/L (ref 11–51)

## 2015-10-26 LAB — URINE MICROSCOPIC-ADD ON

## 2015-10-26 LAB — PREGNANCY, URINE: Preg Test, Ur: NEGATIVE

## 2015-10-26 MED ORDER — PREDNISONE 20 MG PO TABS: ORAL_TABLET | ORAL | 0 refills | Status: DC

## 2015-10-26 NOTE — ED Provider Notes (Signed)
[facility] DEPT Provider Note   CSN: [id number] Arrival date & time: 10/26/15  1113     History   Chief Complaint Chief Complaint  Patient presents with  . Abdominal Pain    HPI Diana Fox is a 45 y.o. female.  Patient states she's had mild abdominal discomfort but also sinus drainage and congestion   The history is provided by the patient. No language interpreter was used.  Abdominal Pain   This is a new problem. The current episode started more than 2 days ago. The problem occurs every several days. The problem has been resolved. The pain is associated with an unknown factor. The pain is located in the generalized abdominal region. The quality of the pain is aching. The pain is at a severity of 2/10. The patient is experiencing no pain. Pertinent negatives include anorexia, diarrhea, frequency, hematuria and headaches. Nothing aggravates the symptoms. Nothing relieves the symptoms. Past workup does not include GI consult.    Past Medical History:  Diagnosis Date  . Back pain   . Hypertension     There are no active problems to display for this patient.   Past Surgical History:  Procedure Laterality Date  . CHOLECYSTECTOMY      OB History    No data available       Home Medications    Prior to Admission medications   Medication Sig Start Date End Date Taking? Authorizing Provider  loratadine (CLARITIN) 10 MG tablet Take 10 mg by mouth daily.   Yes Historical Provider, MD  nebivolol (BYSTOLIC) 10 MG tablet Take 1 tablet (10 mg total) by mouth daily. 08/10/15  Yes Lily Kocher, PA-C  predniSONE (DELTASONE) 20 MG tablet 2 tabs po daily x 3 days 10/26/15   Milton Ferguson, MD    Family History No family history on file.  Social History Social History  Substance Use Topics  . Smoking status: Never Smoker  . Smokeless tobacco: Never Used  . Alcohol use No     Allergies   Motrin [ibuprofen]; Lisinopril; and Sudafed [pseudoephedrine hcl]   Review  of Systems Review of Systems  Constitutional: Negative for appetite change and fatigue.  HENT: Negative for congestion, ear discharge and sinus pressure.   Eyes: Negative for discharge.  Respiratory: Negative for cough.   Cardiovascular: Negative for chest pain.  Gastrointestinal: Positive for abdominal pain. Negative for anorexia and diarrhea.  Genitourinary: Negative for frequency and hematuria.  Musculoskeletal: Negative for back pain.  Skin: Negative for rash.  Neurological: Negative for seizures and headaches.  Psychiatric/Behavioral: Negative for hallucinations.     Physical Exam Updated Vital Signs BP 122/87 (BP Location: Left Arm)   Pulse 90   Temp 98.5 F (36.9 C) (Oral)   Resp 18   Ht 5\' 5"  (1.651 m)   Wt 170 lb (77.1 kg)   LMP 10/09/2015   SpO2 97%   BMI 28.29 kg/m   Physical Exam  Constitutional: She is oriented to person, place, and time. She appears well-developed.  HENT:  Head: Normocephalic.  Eyes: Conjunctivae and EOM are normal. No scleral icterus.  Neck: Neck supple. No thyromegaly present.  Cardiovascular: Normal rate and regular rhythm.  Exam reveals no gallop and no friction rub.   No murmur heard. Pulmonary/Chest: No stridor. She has no wheezes. She has no rales. She exhibits no tenderness.  Abdominal: She exhibits no distension. There is no tenderness. There is no rebound.  Musculoskeletal: Normal range of motion. She exhibits no edema.  Lymphadenopathy:  She has no cervical adenopathy.  Neurological: She is oriented to person, place, and time. She exhibits normal muscle tone. Coordination normal.  Skin: No rash noted. No erythema.  Psychiatric: She has a normal mood and affect. Her behavior is normal.     ED Treatments / Results  Labs (all labs ordered are listed, but only abnormal results are displayed) Labs Reviewed  COMPREHENSIVE METABOLIC PANEL - Abnormal; Notable for the following:       Result Value   AST 14 (*)    All other  components within normal limits  CBC - Abnormal; Notable for the following:    Hemoglobin 10.9 (*)    HCT 34.0 (*)    MCH 25.1 (*)    RDW 18.3 (*)    Platelets 429 (*)    All other components within normal limits  URINALYSIS, ROUTINE W REFLEX MICROSCOPIC (NOT AT [facility]) - Abnormal; Notable for the following:    APPearance HAZY (*)    Hgb urine dipstick TRACE (*)    Leukocytes, UA MODERATE (*)    All other components within normal limits  URINE MICROSCOPIC-ADD ON - Abnormal; Notable for the following:    Squamous Epithelial / LPF 6-30 (*)    Bacteria, UA MANY (*)    All other components within normal limits  LIPASE, BLOOD  PREGNANCY, URINE    EKG  EKG Interpretation None       Radiology Dg Abd Acute W/chest  Result Date: 10/26/2015 CLINICAL DATA:  Cough, nasal drainage. EXAM: DG ABDOMEN ACUTE W/ 1V CHEST COMPARISON:  None. FINDINGS: There is no evidence of dilated bowel loops or free intraperitoneal air. No radiopaque calculi or other significant radiographic abnormality is seen. Calcified mass in the pelvis most consistent with a calcified uterine fibroid. Heart size and mediastinal contours are within normal limits. Both lungs are clear. IMPRESSION: Negative abdominal radiographs.  No acute cardiopulmonary disease. Electronically Signed   By: Kathreen Devoid   On: 10/26/2015 15:25    Procedures Procedures (including critical care time)  Medications Ordered in ED Medications - No data to display   Initial Impression / Assessment and Plan / ED Course  I have reviewed the triage vital signs and the nursing notes.  Pertinent labs & imaging results that were available during my care of the patient were reviewed by me and considered in my medical decision making (see chart for details).  Clinical Course    Labs x-rays unremarkable. Patient probably has allergic rhinitis. Patient on Claritin not helping. Patient will be put on a short course of prednisone will follow-up with  PCP Final Clinical Impressions(s) / ED Diagnoses   Final diagnoses:  Other seasonal allergic rhinitis    New Prescriptions New Prescriptions   PREDNISONE (DELTASONE) 20 MG TABLET    2 tabs po daily x 3 days     Milton Ferguson, MD 10/26/15 [phone]

## 2015-10-26 NOTE — Discharge Instructions (Signed)
Follow up with your md if not improving. °

## 2015-10-26 NOTE — ED Notes (Signed)
Patient returned from xray.

## 2015-10-26 NOTE — ED Triage Notes (Signed)
Pt here with multiple complaints. Pt reports cough and nasal drainage that began on Friday. Pt states that yesterday she began having lower abdominal pain w/o gi/gu symptoms.

## 2016-01-20 ENCOUNTER — Emergency Department (HOSPITAL_COMMUNITY)
Admission: EM | Admit: 2016-01-20 | Discharge: 2016-01-20 | Disposition: A | Discharge status: Home / Self Care | Payer: Medicaid - Out of State

## 2016-01-21 ENCOUNTER — Encounter (HOSPITAL_COMMUNITY): Payer: Self-pay

## 2016-01-21 ENCOUNTER — Emergency Department (HOSPITAL_COMMUNITY)
Admission: EM | Admit: 2016-01-21 | Discharge: 2016-01-21 | Disposition: A | Discharge status: Home / Self Care | Payer: Medicaid - Out of State | Attending: Emergency Medicine | Admitting: Emergency Medicine

## 2016-01-21 DIAGNOSIS — J0100 Acute maxillary sinusitis, unspecified: Secondary | ICD-10-CM

## 2016-01-21 DIAGNOSIS — R21 Rash and other nonspecific skin eruption: Secondary | ICD-10-CM | POA: Insufficient documentation

## 2016-01-21 DIAGNOSIS — I10 Essential (primary) hypertension: Secondary | ICD-10-CM | POA: Diagnosis not present

## 2016-01-21 DIAGNOSIS — Z79899 Other long term (current) drug therapy: Secondary | ICD-10-CM | POA: Insufficient documentation

## 2016-01-21 DIAGNOSIS — L539 Erythematous condition, unspecified: Secondary | ICD-10-CM

## 2016-01-21 DIAGNOSIS — R0981 Nasal congestion: Secondary | ICD-10-CM | POA: Diagnosis present

## 2016-01-21 DIAGNOSIS — L538 Other specified erythematous conditions: Secondary | ICD-10-CM

## 2016-01-21 MED ORDER — MUPIROCIN CALCIUM 2 % NA OINT: TOPICAL_OINTMENT | NASAL | 0 refills | Status: DC

## 2016-01-21 MED ORDER — AZITHROMYCIN 250 MG PO TABS: ORAL_TABLET | ORAL | 0 refills | Status: DC

## 2016-01-21 NOTE — ED Triage Notes (Signed)
Pt reports nasal congestion, sinus pressure, and a cold sore in nose for the past week. Denies cough or fever.

## 2016-01-21 NOTE — Discharge Instructions (Signed)
Tylenol or ibuprofen if needed for fever.  You can also use OTC Afrin nasal spray, one spray to each nostril 2 times a day for 3 days then stop.  Follow-up with your doctor for recheck if needed.

## 2016-01-23 NOTE — ED Provider Notes (Signed)
[facility] DEPT Provider Note   CSN: [id number] Arrival date & time: 01/21/16  [id number]     History   Chief Complaint Chief Complaint  Patient presents with  . Facial Pain    HPI Diana Fox is a 45 y.o. female.  HPI  Diana Fox is a 45 y.o. female who presents to the Emergency Department complaining of nasal congestion, sinus pressure and a painful "sore" inside her left nostril.  Symptoms present for several days.  She has tried OTC cold medications without relief.  She denies fever, chills, cough or shortness of breath.   Past Medical History:  Diagnosis Date  . Back pain   . Hypertension     There are no active problems to display for this patient.   Past Surgical History:  Procedure Laterality Date  . CHOLECYSTECTOMY      OB History    No data available       Home Medications    Prior to Admission medications   Medication Sig Start Date End Date Taking? Authorizing Provider  azithromycin (ZITHROMAX) 250 MG tablet Take first 2 tablets together, then 1 every day until finished. 01/21/16   Cotton Beckley, PA-C  loratadine (CLARITIN) 10 MG tablet Take 10 mg by mouth daily.    Historical Provider, MD  mupirocin nasal ointment (BACTROBAN) 2 % Apply to affected area TID x 10 days 01/21/16   Ziyanna Tolin, PA-C  nebivolol (BYSTOLIC) 10 MG tablet Take 1 tablet (10 mg total) by mouth daily. 08/10/15   Lily Kocher, PA-C  predniSONE (DELTASONE) 20 MG tablet 2 tabs po daily x 3 days 10/26/15   Milton Ferguson, MD    Family History No family history on file.  Social History Social History  Substance Use Topics  . Smoking status: Never Smoker  . Smokeless tobacco: Never Used  . Alcohol use No     Allergies   Motrin [ibuprofen]; Lisinopril; and Sudafed [pseudoephedrine hcl]   Review of Systems Review of Systems  Constitutional: Negative for activity change, appetite change, chills and fever.  HENT: Positive for congestion, rhinorrhea, sinus  pain, sinus pressure and sore throat. Negative for facial swelling and trouble swallowing.   Eyes: Negative for visual disturbance.  Respiratory: Negative for cough, chest tightness and shortness of breath.   Gastrointestinal: Negative for abdominal pain, nausea and vomiting.  Musculoskeletal: Negative for neck pain and neck stiffness.  Skin: Negative.  Negative for rash.  Neurological: Negative for dizziness, weakness, numbness and headaches.  Hematological: Negative for adenopathy.  Psychiatric/Behavioral: Negative for confusion.  All other systems reviewed and are negative.    Physical Exam Updated Vital Signs BP 158/90 (BP Location: Left Arm)   Pulse 81   Temp 98.3 F (36.8 C) (Oral)   Resp 18   Ht 5\' 5"  (1.651 m)   Wt 72.6 kg   LMP 01/17/2016   SpO2 100%   BMI 26.63 kg/m   Physical Exam  Constitutional: She is oriented to person, place, and time. She appears well-developed and well-nourished. No distress.  HENT:  Head: Normocephalic and atraumatic.  Right Ear: Tympanic membrane and ear canal normal.  Left Ear: Tympanic membrane and ear canal normal.  Nose: Mucosal edema and rhinorrhea present. No nasal septal hematoma. No epistaxis. Right sinus exhibits maxillary sinus tenderness. Left sinus exhibits maxillary sinus tenderness.  Mouth/Throat: Uvula is midline and mucous membranes are normal. No trismus in the jaw. No uvula swelling. No oropharyngeal exudate, posterior oropharyngeal edema, posterior oropharyngeal erythema or tonsillar abscesses.  Small 1 cm excoriation inside the left nare.  No edema, no facial edema.  Eyes: Conjunctivae and EOM are normal.  Neck: Normal range of motion and phonation normal. Neck supple. No Brudzinski's sign and no Kernig's sign noted.  Cardiovascular: Normal rate, regular rhythm and intact distal pulses.   No murmur heard. Pulmonary/Chest: Effort normal and breath sounds normal. No respiratory distress. She has no wheezes. She has no  rales.  Abdominal: Soft. She exhibits no distension. There is no tenderness. There is no rebound and no guarding.  Musculoskeletal: She exhibits no edema.  Lymphadenopathy:    She has no cervical adenopathy.  Neurological: She is alert and oriented to person, place, and time. She exhibits normal muscle tone. Coordination normal.  Skin: Skin is warm and dry.  Nursing note and vitals reviewed.    ED Treatments / Results  Labs (all labs ordered are listed, but only abnormal results are displayed) Labs Reviewed - No data to display  EKG  EKG Interpretation None       Radiology No results found.  Procedures Procedures (including critical care time)  Medications Ordered in ED Medications - No data to display   Initial Impression / Assessment and Plan / ED Course  I have reviewed the triage vital signs and the nursing notes.  Pertinent labs & imaging results that were available during my care of the patient were reviewed by me and considered in my medical decision making (see chart for details).  Clinical Course    Pt non-toxic appearing.  Vitals stable.  Likely sinusitis. Pt stable for d/c, agrees to PMD f/u if needed.  Final Clinical Impressions(s) / ED Diagnoses   Final diagnoses:  Acute maxillary sinusitis, recurrence not specified  Acute erythematous eruption of skin    New Prescriptions Discharge Medication List as of 01/21/2016 11:43 AM    START taking these medications   Details  azithromycin (ZITHROMAX) 250 MG tablet Take first 2 tablets together, then 1 every day until finished., Print    mupirocin nasal ointment (BACTROBAN) 2 % Apply to affected area TID x 10 days, Print         Kem Parkinson, PA-C 01/23/16 2202    Merrily Pew, MD 01/26/16 [phone]

## 2016-10-10 ENCOUNTER — Emergency Department (HOSPITAL_COMMUNITY)
Admission: EM | Admit: 2016-10-10 | Discharge: 2016-10-10 | Disposition: A | Discharge status: Home / Self Care | Payer: Medicaid - Out of State | Attending: Emergency Medicine | Admitting: Emergency Medicine

## 2016-10-10 ENCOUNTER — Encounter (HOSPITAL_COMMUNITY): Payer: Self-pay

## 2016-10-10 DIAGNOSIS — R05 Cough: Secondary | ICD-10-CM | POA: Diagnosis present

## 2016-10-10 DIAGNOSIS — Z79899 Other long term (current) drug therapy: Secondary | ICD-10-CM | POA: Insufficient documentation

## 2016-10-10 DIAGNOSIS — I10 Essential (primary) hypertension: Secondary | ICD-10-CM | POA: Insufficient documentation

## 2016-10-10 DIAGNOSIS — R059 Cough, unspecified: Secondary | ICD-10-CM

## 2016-10-10 MED ORDER — AZITHROMYCIN 250 MG PO TABS: ORAL_TABLET | ORAL | 0 refills | Status: DC

## 2016-10-10 MED ORDER — ALBUTEROL SULFATE HFA 108 (90 BASE) MCG/ACT IN AERS
1.0000 | INHALATION_SPRAY | Freq: Once | RESPIRATORY_TRACT | Status: AC
  Administered 2016-10-10: 2 via RESPIRATORY_TRACT
  Filled 2016-10-10: qty 6.7

## 2016-10-10 NOTE — Discharge Instructions (Signed)
1-2 puffs of the inhaler every 4-6 hrs as needed.  Plenty of fluids.  Tylenol if needed for fever.  You can continue the Delsym if needed.  Follow-up with your doctor for recheck.

## 2016-10-10 NOTE — ED Provider Notes (Signed)
[facility] DEPT Provider Note   CSN: [id number] Arrival date & time: 10/10/16  1546     History   Chief Complaint Chief Complaint  Patient presents with  . Cough    HPI Diana Fox is a 46 y.o. female.  HPI  Diana Fox is a 46 y.o. female who presents to the Emergency Department complaining of intermittently productive cough for 2-3 days.  Initially, cough was productive, but now mostly dry and worse with lying supine.  She has tried OTC Delsym and Tussin without relief.  She also complains of mild nasal congestion.  Denies fever, chills, shortness of breath, chest pain, sore throat, or exertional symptoms.  States that previous symptoms like this required antibiotic before it resolved.   Past Medical History:  Diagnosis Date  . Back pain   . Hypertension     There are no active problems to display for this patient.   Past Surgical History:  Procedure Laterality Date  . CHOLECYSTECTOMY      OB History    No data available       Home Medications    Prior to Admission medications   Medication Sig Start Date End Date Taking? Authorizing Provider  azithromycin (ZITHROMAX) 250 MG tablet Take first 2 tablets together, then 1 every day until finished. 01/21/16   Aseem Sessums, PA-C  loratadine (CLARITIN) 10 MG tablet Take 10 mg by mouth daily.    [provider]  mupirocin nasal ointment (BACTROBAN) 2 % Apply to affected area TID x 10 days 01/21/16   Rodina Pinales, PA-C  nebivolol (BYSTOLIC) 10 MG tablet Take 1 tablet (10 mg total) by mouth daily. 08/10/15   Lily Kocher, PA-C  predniSONE (DELTASONE) 20 MG tablet 2 tabs po daily x 3 days 10/26/15   Milton Ferguson, MD    Family History No family history on file.  Social History Social History  Substance Use Topics  . Smoking status: Never Smoker  . Smokeless tobacco: Never Used  . Alcohol use No     Allergies   Motrin [ibuprofen]; Lisinopril; and Sudafed [pseudoephedrine  hcl]   Review of Systems Review of Systems  Constitutional: Negative for appetite change, chills and fever.  HENT: Positive for congestion. Negative for sore throat and trouble swallowing.   Respiratory: Positive for cough. Negative for chest tightness, shortness of breath and wheezing.   Cardiovascular: Negative for chest pain.  Gastrointestinal: Negative for abdominal pain, nausea and vomiting.  Genitourinary: Negative for dysuria.  Musculoskeletal: Negative for arthralgias, myalgias and neck pain.  Skin: Negative for rash.  Neurological: Negative for dizziness, weakness and numbness.  Hematological: Negative for adenopathy.  All other systems reviewed and are negative.    Physical Exam Updated Vital Signs BP (!) 154/81 (BP Location: Right Arm)   Pulse 91   Temp 98.4 F (36.9 C) (Oral)   Resp 14   Ht 5\' 5"  (1.651 m)   Wt 72.6 kg (160 lb)   LMP 09/16/2016   SpO2 100%   BMI 26.63 kg/m   Physical Exam  Constitutional: She is oriented to person, place, and time. She appears well-developed and well-nourished. No distress.  HENT:  Head: Normocephalic and atraumatic.  Right Ear: Tympanic membrane and ear canal normal.  Left Ear: Tympanic membrane and ear canal normal.  Mouth/Throat: Uvula is midline, oropharynx is clear and moist and mucous membranes are normal. No oropharyngeal exudate.  Eyes: Pupils are equal, round, and reactive to light. EOM are normal.  Neck: Normal range of  motion, full passive range of motion without pain and phonation normal. Neck supple. No JVD present.  Cardiovascular: Normal rate, regular rhythm, normal heart sounds and intact distal pulses.   No murmur heard. Pulmonary/Chest: Effort normal. No stridor. No respiratory distress. She has no wheezes. She has no rales. She exhibits no tenderness.  Pt is actively coughing.  Mildly course lung sounds bilaterally, no wheezes or rales.   Abdominal: Soft. She exhibits no distension. There is no tenderness.   Musculoskeletal: Normal range of motion. She exhibits no edema.  Lymphadenopathy:    She has no cervical adenopathy.  Neurological: She is alert and oriented to person, place, and time. She exhibits normal muscle tone. Coordination normal.  Skin: Skin is warm and dry. Capillary refill takes less than 2 seconds.  Psychiatric: She has a normal mood and affect.  Nursing note and vitals reviewed.    ED Treatments / Results  Labs (all labs ordered are listed, but only abnormal results are displayed) Labs Reviewed - No data to display  EKG  EKG Interpretation None       Radiology No results found.  Procedures Procedures (including critical care time)  Medications Ordered in ED Medications - No data to display   Initial Impression / Assessment and Plan / ED Course  I have reviewed the triage vital signs and the nursing notes.  Pertinent labs & imaging results that were available during my care of the patient were reviewed by me and considered in my medical decision making (see chart for details).     Pt is well appearing, non-toxic.  PERC negative.   Albuterol inhaler dispensed.  Return precautions discussed.    Final Clinical Impressions(s) / ED Diagnoses   Final diagnoses:  Cough    New Prescriptions New Prescriptions   No medications on file     Bufford Lope 10/10/16 1635    Davonna Belling, MD 10/10/16 2356

## 2016-10-10 NOTE — ED Triage Notes (Signed)
Pt reports she has been coughing for couple of days. Cough is worse at night. Mostly a dry cough. Pt noted to be hoarse

## 2016-11-28 ENCOUNTER — Emergency Department (HOSPITAL_COMMUNITY)
Admission: EM | Admit: 2016-11-28 | Discharge: 2016-11-28 | Disposition: A | Discharge status: Home / Self Care | Payer: Medicaid - Out of State | Attending: Emergency Medicine | Admitting: Emergency Medicine

## 2016-11-28 ENCOUNTER — Encounter (HOSPITAL_COMMUNITY): Payer: Self-pay | Admitting: Emergency Medicine

## 2016-11-28 DIAGNOSIS — Z79899 Other long term (current) drug therapy: Secondary | ICD-10-CM | POA: Diagnosis not present

## 2016-11-28 DIAGNOSIS — I10 Essential (primary) hypertension: Secondary | ICD-10-CM | POA: Insufficient documentation

## 2016-11-28 DIAGNOSIS — R05 Cough: Secondary | ICD-10-CM | POA: Diagnosis present

## 2016-11-28 DIAGNOSIS — J302 Other seasonal allergic rhinitis: Secondary | ICD-10-CM | POA: Diagnosis not present

## 2016-11-28 NOTE — ED Provider Notes (Signed)
[facility] EMERGENCY DEPARTMENT Provider Note   CSN: [id number] Arrival date & time: 11/28/16  1918     History   Chief Complaint Chief Complaint  Patient presents with  . Cough    HPI Diana Fox is a 46 y.o. female.  The history is provided by the patient. No language interpreter was used.  Cough  This is a new problem. The current episode started 6 to 12 hours ago. The problem occurs constantly. The problem has been gradually worsening. The cough is non-productive. There has been no fever. Pertinent negatives include no chest pain. She has tried nothing for the symptoms. The treatment provided no relief. She is not a smoker. Her past medical history does not include pneumonia.   Pt has a history of allergies.  Pt reports she has been sneezing all day Past Medical History:  Diagnosis Date  . Back pain   . Hypertension     There are no active problems to display for this patient.   Past Surgical History:  Procedure Laterality Date  . CHOLECYSTECTOMY      OB History    No data available       Home Medications    Prior to Admission medications   Medication Sig Start Date End Date Taking? Authorizing Provider  azithromycin (ZITHROMAX) 250 MG tablet Take first 2 tablets together, then 1 every day until finished. 10/10/16   Triplett, Tammy, PA-C  loratadine (CLARITIN) 10 MG tablet Take 10 mg by mouth daily.    [provider]  mupirocin nasal ointment (BACTROBAN) 2 % Apply to affected area TID x 10 days 01/21/16   Triplett, Tammy, PA-C  nebivolol (BYSTOLIC) 10 MG tablet Take 1 tablet (10 mg total) by mouth daily. 08/10/15   Lily Kocher, PA-C  predniSONE (DELTASONE) 20 MG tablet 2 tabs po daily x 3 days 10/26/15   Milton Ferguson, MD    Family History History reviewed. No pertinent family history.  Social History Social History  Substance Use Topics  . Smoking status: Never Smoker  . Smokeless tobacco: Never Used  . Alcohol use No      Allergies   Motrin [ibuprofen]; Lisinopril; and Sudafed [pseudoephedrine hcl]   Review of Systems Review of Systems  Respiratory: Positive for cough.   Cardiovascular: Negative for chest pain.  All other systems reviewed and are negative.    Physical Exam Updated Vital Signs BP (!) 149/88 (BP Location: Right Arm)   Pulse 80   Temp 98.5 F (36.9 C)   Resp 18   Ht 5\' 5"  (1.651 m)   Wt 76.2 kg (168 lb)   LMP 11/08/2016   SpO2 100%   BMI 27.96 kg/m   Physical Exam  Constitutional: She appears well-developed and well-nourished. No distress.  HENT:  Head: Normocephalic and atraumatic.  Eyes: Conjunctivae are normal.  Neck: Neck supple.  Cardiovascular: Normal rate and regular rhythm.   No murmur heard. Pulmonary/Chest: Effort normal and breath sounds normal. No respiratory distress.  Abdominal: Soft. There is no tenderness.  Musculoskeletal: She exhibits no edema.  Neurological: She is alert.  Skin: Skin is warm and dry.  Psychiatric: She has a normal mood and affect.  Nursing note and vitals reviewed.    ED Treatments / Results  Labs (all labs ordered are listed, but only abnormal results are displayed) Labs Reviewed - No data to display  EKG  EKG Interpretation None       Radiology No results found.  Procedures Procedures (including critical  care time)  Medications Ordered in ED Medications - No data to display   Initial Impression / Assessment and Plan / ED Course  I have reviewed the triage vital signs and the nursing notes.  Pertinent labs & imaging results that were available during my care of the patient were reviewed by me and considered in my medical decision making (see chart for details).     Pt advised zyrtec and flonase for symptoms.  Pt advised to return if any symptoms.  Final Clinical Impressions(s) / ED Diagnoses   Final diagnoses:  Seasonal allergies    New Prescriptions New Prescriptions   No medications on file   An After Visit Summary was printed and given to the patient.   Fransico Meadow, Hershal Coria 11/28/16 2027    Fredia Sorrow, MD 12/01/16 [phone]

## 2016-11-28 NOTE — ED Triage Notes (Signed)
Pt c/o cough, congestion, runny nose and sneezing that started today.

## 2017-03-08 ENCOUNTER — Emergency Department (HOSPITAL_COMMUNITY)
Admission: EM | Admit: 2017-03-08 | Discharge: 2017-03-08 | Disposition: A | Discharge status: Home / Self Care | Payer: Medicaid - Out of State | Attending: Emergency Medicine | Admitting: Emergency Medicine

## 2017-03-08 ENCOUNTER — Encounter (HOSPITAL_COMMUNITY): Payer: Self-pay

## 2017-03-08 DIAGNOSIS — B9789 Other viral agents as the cause of diseases classified elsewhere: Secondary | ICD-10-CM | POA: Diagnosis not present

## 2017-03-08 DIAGNOSIS — J069 Acute upper respiratory infection, unspecified: Secondary | ICD-10-CM | POA: Diagnosis not present

## 2017-03-08 DIAGNOSIS — Z79899 Other long term (current) drug therapy: Secondary | ICD-10-CM | POA: Diagnosis not present

## 2017-03-08 DIAGNOSIS — J029 Acute pharyngitis, unspecified: Secondary | ICD-10-CM | POA: Diagnosis present

## 2017-03-08 DIAGNOSIS — I10 Essential (primary) hypertension: Secondary | ICD-10-CM | POA: Insufficient documentation

## 2017-03-08 MED ORDER — GUAIFENESIN ER 600 MG PO TB12: 600.0000 mg | ORAL_TABLET | Freq: Two times a day (BID) | ORAL | 0 refills | Status: AC | PRN

## 2017-03-08 MED ORDER — BENZONATATE 100 MG PO CAPS: 100.0000 mg | ORAL_CAPSULE | Freq: Three times a day (TID) | ORAL | 0 refills | Status: AC

## 2017-03-08 NOTE — ED Triage Notes (Signed)
Pt c/o sore throat since yesterday.   Denies fever.

## 2017-03-08 NOTE — Discharge Instructions (Signed)
Make sure to stay hydrated over the next few days. Please follow up with your primary doctor within the next 5-7 days to check resolution of your symptoms. Please return to the ER sooner if you have any new or worsening symptoms including any fevers, chest pain, shortness of breath, inability to swallow.

## 2017-03-08 NOTE — ED Provider Notes (Signed)
[facility] EMERGENCY DEPARTMENT Provider Note   CSN: [id number] Arrival date & time: 03/08/17  1526     History   Chief Complaint Chief Complaint  Patient presents with  . Sore Throat    HPI Diana Fox is a 47 y.o. female.  HPI   47 y/o F presents to the ED c/o sore throat that began yesterday. Started suddenly and rates it at an 8/10. Hurts all over and not worse on one side vs other. States she is able to swallow, but is painful. Also reports a dry cough, rhinorrhea and right ear pain. Denies fevers, ear drainage, sinus pain/pressure, nasal congestion, SOB, chest pain, abd pain, NVD, constipation. Has tried claritin with no relief.   States she did not receive her flu shot this year. Does not smoke.   Past Medical History:  Diagnosis Date  . Back pain   . Hypertension     There are no active problems to display for this patient.   Past Surgical History:  Procedure Laterality Date  . CHOLECYSTECTOMY      OB History    No data available       Home Medications    Prior to Admission medications   Medication Sig Start Date End Date Taking? Authorizing Provider  azithromycin (ZITHROMAX) 250 MG tablet Take first 2 tablets together, then 1 every day until finished. 10/10/16   Triplett, Tammy, PA-C  benzonatate (TESSALON) 100 MG capsule Take 1 capsule (100 mg total) by mouth 3 (three) times daily for 7 days. 03/08/17 03/15/17  Jaquis Picklesimer S, PA-C  guaiFENesin (MUCINEX) 600 MG 12 hr tablet Take 1 tablet (600 mg total) by mouth every 12 (twelve) hours as needed for up to 4 days. 03/08/17 03/12/17  Zebulan Hinshaw S, PA-C  loratadine (CLARITIN) 10 MG tablet Take 10 mg by mouth daily.    [provider]  mupirocin nasal ointment (BACTROBAN) 2 % Apply to affected area TID x 10 days 01/21/16   Triplett, Tammy, PA-C  nebivolol (BYSTOLIC) 10 MG tablet Take 1 tablet (10 mg total) by mouth daily. 08/10/15   Lily Kocher, PA-C  predniSONE (DELTASONE) 20 MG tablet 2  tabs po daily x 3 days 10/26/15   Milton Ferguson, MD    Family History No family history on file.  Social History Social History   Tobacco Use  . Smoking status: Never Smoker  . Smokeless tobacco: Never Used  Substance Use Topics  . Alcohol use: No  . Drug use: No     Allergies   Motrin [ibuprofen]; Lisinopril; and Sudafed [pseudoephedrine hcl]   Review of Systems Review of Systems  Constitutional: Negative for chills and fever.  HENT: Positive for ear pain, postnasal drip, rhinorrhea and sore throat. Negative for congestion, sinus pressure, sinus pain and trouble swallowing.   Eyes: Negative for visual disturbance.  Respiratory: Positive for cough. Negative for shortness of breath and wheezing.   Cardiovascular: Negative for chest pain.  Gastrointestinal: Negative for abdominal pain, constipation, diarrhea, nausea and vomiting.  Genitourinary: Negative for pelvic pain.  Musculoskeletal: Negative for back pain.  Skin: Negative for rash.     Physical Exam Updated Vital Signs BP 132/84 (BP Location: Right Arm)   Pulse 90   Temp 99.6 F (37.6 C) (Oral)   Resp 14   Ht 5\' 5"  (1.651 m)   Wt 72.6 kg (160 lb)   SpO2 100%   BMI 26.63 kg/m   Physical Exam  Constitutional: She appears well-developed and well-nourished. No  distress.  HENT:  Head: Normocephalic and atraumatic.  Right Ear: Tympanic membrane and ear canal normal. No drainage.  Left Ear: Tympanic membrane and ear canal normal. No drainage.  Mouth/Throat: Uvula is midline, oropharynx is clear and moist and mucous membranes are normal. No oral lesions. No uvula swelling. No oropharyngeal exudate, posterior oropharyngeal edema, posterior oropharyngeal erythema or tonsillar abscesses. Tonsils are 0 on the right. Tonsils are 0 on the left.  No tonsillar swelling or exudates. Drainage noted to back of throat  Eyes: Conjunctivae and EOM are normal. Pupils are equal, round, and reactive to light.  Neck: Normal range  of motion. Neck supple.  Cardiovascular: Normal rate, regular rhythm, normal heart sounds and intact distal pulses.  No murmur heard. Pulmonary/Chest: Effort normal and breath sounds normal. No respiratory distress. She has no wheezes. She has no rhonchi. She has no rales.  Dry cough on exam  Abdominal: Soft. Bowel sounds are normal. She exhibits no distension and no mass. There is no tenderness. There is no guarding.  Musculoskeletal: She exhibits no edema.  Lymphadenopathy:    She has no cervical adenopathy.  Neurological: She is alert.  Skin: Skin is warm and dry. Capillary refill takes less than 2 seconds.  No rashes.   Psychiatric: She has a normal mood and affect.  Nursing note and vitals reviewed.    ED Treatments / Results  Labs (all labs ordered are listed, but only abnormal results are displayed) Labs Reviewed - No data to display  EKG  EKG Interpretation None       Radiology No results found.  Procedures Procedures (including critical care time)  Medications Ordered in ED Medications - No data to display   Initial Impression / Assessment and Plan / ED Course  I have reviewed the triage vital signs and the nursing notes.  Pertinent labs & imaging results that were available during my care of the patient were reviewed by me and considered in my medical decision making (see chart for details).   Discussed that patient likely has a viral infection. Rx sent for cough and mucinex. Discussed plan for discharge with outpatient follow up in 5-7 days. Advised pt to return to the ER if they experience any new or worsening symptoms. Pt understands and agrees with the plan. All questions answered.   Final Clinical Impressions(s) / ED Diagnoses   Final diagnoses:  Viral URI with cough    Pt afebrile without tonsillar exudate. Centor score -1 so strep swab/culture not obtained. Presents with no cervical lymphadenopathy, however does have some dysphagia; diagnosis of  viral pharyngitis. Also with dry cough, but pulmonary exam CTA with no wheezing or adventitious breath sounds. Afebrile and nonsmoker. No abx indicated. DC w symptomatic tx for pain  Pt does not appear dehydrated, but did discuss importance of water rehydration. Presentation non concerning for PTA or infxn spread to soft tissue. No trismus or uvula deviation. Specific return precautions discussed. Pt able to drink water in ED without difficulty with intact air way. Recommended PCP follow up. Gave strict return precautions   ED Discharge Orders        Ordered    guaiFENesin (MUCINEX) 600 MG 12 hr tablet  Every 12 hours PRN     03/08/17 1612    benzonatate (TESSALON) 100 MG capsule  3 times daily     03/08/17 [address], [place], PA-C 03/08/17 1843    Hayden Rasmussen, MD 03/08/17 Joen Laura

## 2017-05-09 ENCOUNTER — Emergency Department (HOSPITAL_COMMUNITY)
Admission: EM | Admit: 2017-05-09 | Discharge: 2017-05-09 | Disposition: A | Discharge status: Home / Self Care | Payer: No Typology Code available for payment source | Attending: Emergency Medicine | Admitting: Emergency Medicine

## 2017-05-09 ENCOUNTER — Encounter (HOSPITAL_COMMUNITY): Payer: Self-pay | Admitting: Emergency Medicine

## 2017-05-09 ENCOUNTER — Other Ambulatory Visit: Payer: Self-pay

## 2017-05-09 DIAGNOSIS — I10 Essential (primary) hypertension: Secondary | ICD-10-CM | POA: Insufficient documentation

## 2017-05-09 DIAGNOSIS — S4991XA Unspecified injury of right shoulder and upper arm, initial encounter: Secondary | ICD-10-CM | POA: Diagnosis present

## 2017-05-09 DIAGNOSIS — M62838 Other muscle spasm: Secondary | ICD-10-CM | POA: Insufficient documentation

## 2017-05-09 DIAGNOSIS — Y939 Activity, unspecified: Secondary | ICD-10-CM | POA: Diagnosis not present

## 2017-05-09 DIAGNOSIS — S46811A Strain of other muscles, fascia and tendons at shoulder and upper arm level, right arm, initial encounter: Secondary | ICD-10-CM | POA: Diagnosis not present

## 2017-05-09 DIAGNOSIS — Y998 Other external cause status: Secondary | ICD-10-CM | POA: Diagnosis not present

## 2017-05-09 DIAGNOSIS — S39012A Strain of muscle, fascia and tendon of lower back, initial encounter: Secondary | ICD-10-CM | POA: Diagnosis not present

## 2017-05-09 DIAGNOSIS — Y92410 Unspecified street and highway as the place of occurrence of the external cause: Secondary | ICD-10-CM | POA: Insufficient documentation

## 2017-05-09 DIAGNOSIS — Z79899 Other long term (current) drug therapy: Secondary | ICD-10-CM | POA: Diagnosis not present

## 2017-05-09 DIAGNOSIS — S46812A Strain of other muscles, fascia and tendons at shoulder and upper arm level, left arm, initial encounter: Secondary | ICD-10-CM | POA: Diagnosis not present

## 2017-05-09 DIAGNOSIS — T148XXA Other injury of unspecified body region, initial encounter: Secondary | ICD-10-CM

## 2017-05-09 MED ORDER — DEXAMETHASONE 4 MG PO TABS: 4.0000 mg | ORAL_TABLET | Freq: Two times a day (BID) | ORAL | 0 refills | Status: DC

## 2017-05-09 MED ORDER — CYCLOBENZAPRINE HCL 10 MG PO TABS: 10.0000 mg | ORAL_TABLET | Freq: Three times a day (TID) | ORAL | 0 refills | Status: DC

## 2017-05-09 MED ORDER — TRAMADOL HCL 50 MG PO TABS: 50.0000 mg | ORAL_TABLET | Freq: Four times a day (QID) | ORAL | 0 refills | Status: DC | PRN
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
## 2017-05-09 NOTE — ED Notes (Signed)
Pt verbalized understanding of no driving and to use caution within 4 hours of taking pain meds due to meds cause drowsiness 

## 2017-05-09 NOTE — ED Notes (Signed)
ED Provider at bedside. 

## 2017-05-09 NOTE — Discharge Instructions (Addendum)
Your vital signs within normal limits.  Your examination suggest muscle strain of both your upper neck and shoulders area as well as your lower back area.  Heating pad to these areas will be helpful.  Please use Flexeril 3 times daily for spasm.  Please use Decadron 2 times daily with food for inflammation, and use Ultram every 6 hours for pain not improved by x-ray to Tylenol.  Please use extra strength Tylenol every 4 hours as needed for soreness and aching.  Please see your primary provider or return to the emergency department if any changes in your condition, problems, or concerns.

## 2017-08-29 ENCOUNTER — Other Ambulatory Visit: Payer: Self-pay

## 2017-08-29 ENCOUNTER — Emergency Department (HOSPITAL_COMMUNITY)
Admission: EM | Admit: 2017-08-29 | Discharge: 2017-08-29 | Disposition: A | Discharge status: Home / Self Care | Payer: Medicaid - Out of State | Attending: Emergency Medicine | Admitting: Emergency Medicine

## 2017-08-29 ENCOUNTER — Encounter (HOSPITAL_COMMUNITY): Payer: Self-pay | Admitting: Emergency Medicine

## 2017-08-29 DIAGNOSIS — Y999 Unspecified external cause status: Secondary | ICD-10-CM | POA: Insufficient documentation

## 2017-08-29 DIAGNOSIS — S46912A Strain of unspecified muscle, fascia and tendon at shoulder and upper arm level, left arm, initial encounter: Secondary | ICD-10-CM

## 2017-08-29 DIAGNOSIS — Y9389 Activity, other specified: Secondary | ICD-10-CM | POA: Insufficient documentation

## 2017-08-29 DIAGNOSIS — Y92121 Bathroom in nursing home as the place of occurrence of the external cause: Secondary | ICD-10-CM | POA: Diagnosis not present

## 2017-08-29 DIAGNOSIS — I10 Essential (primary) hypertension: Secondary | ICD-10-CM | POA: Insufficient documentation

## 2017-08-29 DIAGNOSIS — W1812XA Fall from or off toilet with subsequent striking against object, initial encounter: Secondary | ICD-10-CM | POA: Insufficient documentation

## 2017-08-29 DIAGNOSIS — S4992XA Unspecified injury of left shoulder and upper arm, initial encounter: Secondary | ICD-10-CM | POA: Diagnosis present

## 2017-08-29 DIAGNOSIS — Z79899 Other long term (current) drug therapy: Secondary | ICD-10-CM | POA: Diagnosis not present

## 2017-08-29 DIAGNOSIS — J329 Chronic sinusitis, unspecified: Secondary | ICD-10-CM | POA: Insufficient documentation

## 2017-08-29 NOTE — ED Notes (Signed)
Ace wrap applied to L elbow.

## 2017-08-29 NOTE — ED Triage Notes (Signed)
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

## 2017-08-29 NOTE — Discharge Instructions (Addendum)
Please  apply the Ace bandage to your left elbow daily.  Apply ice with caution.  Use Tylenol every 4 hours, or ibuprofen every 6 hours.  Your cough is probably related to sinusitis with postnasal drip.  Please increase fluids.  Please use Claritin-D, Allegra-D, or Afrin spray.  If you choose to use the Afrin spray, use it for for 5 days, and then rest for for 5 days before using it again.  Please see your  primary physician or return to the emergency department if not improving.

## 2017-08-29 NOTE — ED Provider Notes (Signed)
[facility] EMERGENCY DEPARTMENT Provider Note   CSN: [id number] Arrival date & time: 08/29/17  1634     History   Chief Complaint Chief Complaint  Patient presents with  . Arm Injury    HPI Diana Fox is a 47 y.o. female.  Patient is a 47 year old female who presents to the emergency department with a complaint of left arm pain.  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  She says that after this event she has pain with bending her arm and with twisting it in certain directions.  No complaint of unusual numbness.  No shoulder pain reported.  No hand pain reported.  No previous operations or procedures involving the left upper extremity.  Patient presents now for evaluation of this issue.  The patient states she would also like to be evaluated because of a cough that is mostly at night.  She says this is been going on over the past 2 weeks.  She has been trying over-the-counter medications.  She consulted the local pharmacist and was told to get some tests and over-the-counter, but she states that none of these medications so far have been helping her.  The cough at times keeps her up at nighttime.  The cough is mostly nonproductive, but occasionally there is some white to the Page phlegm.  No fever, no chills, no hemoptysis reported.  Patient denies smoking, or using any drugs, and no previous injury to the chest.  The history is provided by the patient.  Arm Injury      Past Medical History:  Diagnosis Date  . Back pain   . Hypertension     There are no active problems to display for this patient.   Past Surgical History:  Procedure Laterality Date  . CHOLECYSTECTOMY       OB History   None      Home Medications    Prior to Admission medications   Medication Sig Start Date End  Date Taking? Authorizing Provider  azithromycin (ZITHROMAX) 250 MG tablet Take first 2 tablets together, then 1 every day until finished. 10/10/16   Triplett, Tammy, PA-C  cyclobenzaprine (FLEXERIL) 10 MG tablet Take 1 tablet (10 mg total) by mouth 3 (three) times daily. 05/09/17   Lily Kocher, PA-C  dexamethasone (DECADRON) 4 MG tablet Take 1 tablet (4 mg total) by mouth 2 (two) times daily with a meal. 05/09/17   Lily Kocher, PA-C  loratadine (CLARITIN) 10 MG tablet Take 10 mg by mouth daily.    [provider]  mupirocin nasal ointment (BACTROBAN) 2 % Apply to affected area TID x 10 days 01/21/16   Triplett, Tammy, PA-C  nebivolol (BYSTOLIC) 10 MG tablet Take 1 tablet (10 mg total) by mouth daily. 08/10/15   Lily Kocher, PA-C  predniSONE (DELTASONE) 20 MG tablet 2 tabs po daily x 3 days 10/26/15   Milton Ferguson, MD  traMADol (ULTRAM) 50 MG tablet Take 1 tablet (50 mg total) by mouth every 6 (six) hours as needed. 05/09/17   Lily Kocher, PA-C    Family History No family history on file.  Social History Social History   Tobacco Use  . Smoking status: Never Smoker  . Smokeless tobacco: Never Used  Substance Use Topics  . Alcohol use: No  . Drug use: No  Allergies   Motrin [ibuprofen]; Lisinopril; and Sudafed [pseudoephedrine hcl]   Review of Systems Review of Systems  Constitutional: Negative for activity change, chills, fatigue and fever.       All ROS Neg except as noted in HPI  HENT: Negative for nosebleeds.   Eyes: Negative for photophobia and discharge.  Respiratory: Positive for cough. Negative for shortness of breath and wheezing.   Cardiovascular: Negative for chest pain and palpitations.  Gastrointestinal: Negative for abdominal pain and blood in stool.  Genitourinary: Negative for dysuria, frequency and hematuria.  Musculoskeletal: Positive for arthralgias. Negative for back pain and neck pain.  Skin: Negative.   Neurological: Negative for dizziness,  seizures and speech difficulty.  Psychiatric/Behavioral: Negative for confusion and hallucinations.     Physical Exam Updated Vital Signs BP (!) 163/83 (BP Location: Right Arm)   Pulse 82   Temp 98 F (36.7 C) (Temporal)   Resp 16   Ht 5\' 5"  (1.651 m)   Wt 72.6 kg (160 lb)   LMP 08/20/2017   SpO2 100%   BMI 26.63 kg/m   Physical Exam  Constitutional: She is oriented to person, place, and time. She appears well-developed and well-nourished.  Non-toxic appearance.  HENT:  Head: Normocephalic.  Right Ear: Tympanic membrane and external ear normal.  Left Ear: Tympanic membrane and external ear normal.  Nasal congestion present.  Eyes: Pupils are equal, round, and reactive to light. EOM and lids are normal.  Neck: Normal range of motion. Neck supple. Carotid bruit is not present.  Cardiovascular: Normal rate, regular rhythm, normal heart sounds, intact distal pulses and normal pulses.  Pulmonary/Chest: Breath sounds normal. No respiratory distress.  Abdominal: Soft. Bowel sounds are normal. There is no tenderness. There is no guarding.  Musculoskeletal: Normal range of motion. She exhibits tenderness.       Left elbow: She exhibits no effusion, no deformity and no laceration. Tenderness found. Lateral epicondyle tenderness noted.       Left forearm: She exhibits tenderness. She exhibits no swelling and no deformity.       Arms: Lymphadenopathy:       Head (right side): No submandibular adenopathy present.       Head (left side): No submandibular adenopathy present.    She has no cervical adenopathy.  Neurological: She is alert and oriented to person, place, and time. She has normal strength. No cranial nerve deficit or sensory deficit.  Skin: Skin is warm and dry.  Psychiatric: She has a normal mood and affect. Her speech is normal.  Nursing note and vitals reviewed.    ED Treatments / Results  Labs (all labs ordered are listed, but only abnormal results are  displayed) Labs Reviewed - No data to display  EKG None  Radiology No results found.  Procedures Procedures (including critical care time)  Medications Ordered in ED Medications - No data to display   Initial Impression / Assessment and Plan / ED Course  I have reviewed the triage vital signs and the nursing notes.  Pertinent labs & imaging results that were available during my care of the patient were reviewed by me and considered in my medical decision making (see chart for details).       Final Clinical Impressions(s) / ED Diagnoses MDM  Vital signs are within normal limits, with the exception of the blood pressure being elevated at 163/83.  Pulse oximetry is 100% on room air.  Within normal limits by my interpretation.  The patient has full  range of motion of the shoulder, elbow, wrist, and fingers on the left. Compartments are soft. No dislocations.  There is tenderness at the lateral epicondyles extending into the forearm.  Pain can be reproduced with pronation and supination.  I suspect that the patient has a strain of the elbow/forearm area.  Ace wrap applied.  Patient will use Tylenol every 4 hours, or ibuprofen every 6 hours for discomfort.  The patient has nasal congestion.  The lungs are clear.  I suspect the cough is related to sinus congestion.  I have asked the patient to increase fluids, to use Claritin-D, or the Afrin spray for assistance with her congestion and cough.  The patient is to follow-up with her primary physician or return to the emergency department if not improving.   Final diagnoses:  None    ED Discharge Orders    None       Lily Kocher, Hershal Coria 08/29/17 1715    Isla Pence, MD 08/29/17 1745

## 2017-11-18 ENCOUNTER — Encounter (HOSPITAL_COMMUNITY): Payer: Self-pay | Admitting: Emergency Medicine

## 2017-11-18 ENCOUNTER — Other Ambulatory Visit: Payer: Self-pay

## 2017-11-18 ENCOUNTER — Emergency Department (HOSPITAL_COMMUNITY)
Admission: EM | Admit: 2017-11-18 | Discharge: 2017-11-18 | Disposition: A | Discharge status: Home / Self Care | Payer: Medicaid - Out of State | Attending: Emergency Medicine | Admitting: Emergency Medicine

## 2017-11-18 DIAGNOSIS — X501XXA Overexertion from prolonged static or awkward postures, initial encounter: Secondary | ICD-10-CM | POA: Diagnosis not present

## 2017-11-18 DIAGNOSIS — Y990 Civilian activity done for income or pay: Secondary | ICD-10-CM | POA: Insufficient documentation

## 2017-11-18 DIAGNOSIS — M545 Low back pain, unspecified: Secondary | ICD-10-CM

## 2017-11-18 DIAGNOSIS — Z79899 Other long term (current) drug therapy: Secondary | ICD-10-CM | POA: Insufficient documentation

## 2017-11-18 DIAGNOSIS — Y9389 Activity, other specified: Secondary | ICD-10-CM | POA: Insufficient documentation

## 2017-11-18 DIAGNOSIS — S3992XA Unspecified injury of lower back, initial encounter: Secondary | ICD-10-CM | POA: Diagnosis present

## 2017-11-18 DIAGNOSIS — Y929 Unspecified place or not applicable: Secondary | ICD-10-CM | POA: Diagnosis not present

## 2017-11-18 DIAGNOSIS — I10 Essential (primary) hypertension: Secondary | ICD-10-CM | POA: Diagnosis not present

## 2017-11-18 DIAGNOSIS — S39012A Strain of muscle, fascia and tendon of lower back, initial encounter: Secondary | ICD-10-CM | POA: Diagnosis not present

## 2017-11-18 DIAGNOSIS — T148XXA Other injury of unspecified body region, initial encounter: Secondary | ICD-10-CM

## 2017-11-18 HISTORY — DX: Anemia, unspecified: D64.9

## 2017-11-18 MED ORDER — METHOCARBAMOL 500 MG PO TABS: 500.0000 mg | ORAL_TABLET | Freq: Two times a day (BID) | ORAL | 0 refills | Status: DC

## 2017-11-18 MED ORDER — MELOXICAM 7.5 MG PO TABS: 7.5000 mg | ORAL_TABLET | Freq: Every day | ORAL | 0 refills | Status: DC

## 2017-11-18 NOTE — ED Notes (Signed)
To lab for UDS

## 2017-11-18 NOTE — ED Provider Notes (Signed)
[facility] Provider Note   CSN: [id number] Arrival date & time: 11/18/17  1245     History   Chief Complaint Chief Complaint  Patient presents with  . Back Pain    HPI Diana Fox is a 47 y.o. female.  The history is provided by the patient. No language interpreter was used.  Back Pain   This is a new problem. The current episode started yesterday. The problem occurs constantly. The problem has been gradually worsening. The pain is associated with twisting and lifting heavy objects. The pain is present in the lumbar spine. The quality of the pain is described as aching. The pain is moderate. The pain is worse during the day. Pertinent negatives include no bowel incontinence, no bladder incontinence and no paresis. She has tried nothing for the symptoms. The treatment provided no relief.  Pt reports she twisted yesterday at work and began having pain in her   Past Medical History:  Diagnosis Date  . Anemia   . Back pain   . Hypertension     There are no active problems to display for this patient.   Past Surgical History:  Procedure Laterality Date  . CHOLECYSTECTOMY       OB History   None      Home Medications    Prior to Admission medications   Medication Sig Start Date End Date Taking? Authorizing Provider  azithromycin (ZITHROMAX) 250 MG tablet Take first 2 tablets together, then 1 every day until finished. 10/10/16   Triplett, Tammy, PA-C  cyclobenzaprine (FLEXERIL) 10 MG tablet Take 1 tablet (10 mg total) by mouth 3 (three) times daily. 05/09/17   Lily Kocher, PA-C  dexamethasone (DECADRON) 4 MG tablet Take 1 tablet (4 mg total) by mouth 2 (two) times daily with a meal. 05/09/17   Lily Kocher, PA-C  loratadine (CLARITIN) 10 MG tablet Take 10 mg by mouth daily.    [provider]  meloxicam (MOBIC) 7.5 MG tablet Take 1 tablet (7.5 mg total) by mouth daily. 11/18/17   Fransico Meadow, PA-C  methocarbamol (ROBAXIN) 500 MG  tablet Take 1 tablet (500 mg total) by mouth 2 (two) times daily. 11/18/17   Fransico Meadow, PA-C  mupirocin nasal ointment (BACTROBAN) 2 % Apply to affected area TID x 10 days 01/21/16   Triplett, Tammy, PA-C  nebivolol (BYSTOLIC) 10 MG tablet Take 1 tablet (10 mg total) by mouth daily. 08/10/15   Lily Kocher, PA-C  predniSONE (DELTASONE) 20 MG tablet 2 tabs po daily x 3 days 10/26/15   Milton Ferguson, MD  traMADol (ULTRAM) 50 MG tablet Take 1 tablet (50 mg total) by mouth every 6 (six) hours as needed. 05/09/17   Lily Kocher, PA-C    Family History History reviewed. No pertinent family history.  Social History Social History   Tobacco Use  . Smoking status: Never Smoker  . Smokeless tobacco: Never Used  Substance Use Topics  . Alcohol use: No  . Drug use: No     Allergies   Motrin [ibuprofen]; Lisinopril; and Sudafed [pseudoephedrine hcl]   Review of Systems Review of Systems  Gastrointestinal: Negative for bowel incontinence.  Genitourinary: Negative for bladder incontinence.  Musculoskeletal: Positive for back pain.  All other systems reviewed and are negative.    Physical Exam Updated Vital Signs BP 114/72 (BP Location: Right Arm)   Pulse 77   Temp 98.8 F (37.1 C) (Temporal)   Resp 16   Ht 5\' 5"  (1.651  m)   Wt 75.3 kg   LMP 11/18/2017   SpO2 100%   BMI 27.62 kg/m   Physical Exam  Constitutional: She appears well-developed and well-nourished.  HENT:  Head: Normocephalic.  Eyes: Pupils are equal, round, and reactive to light.  Cardiovascular: Normal rate.  Pulmonary/Chest: Effort normal.  Musculoskeletal: Normal range of motion.  Tender right flank  From nv and ns intact   Neurological: She is alert.  Skin: Skin is warm.  Psychiatric: She has a normal mood and affect.  Nursing note and vitals reviewed.    ED Treatments / Results  Labs (all labs ordered are listed, but only abnormal results are displayed) Labs Reviewed - No data to  display  EKG None  Radiology No results found.  Procedures Procedures (including critical care time)  Medications Ordered in ED Medications - No data to display   Initial Impression / Assessment and Plan / ED Course  I have reviewed the triage vital signs and the nursing notes.  Pertinent labs & imaging results that were available during my care of the patient were reviewed by me and considered in my medical decision making (see chart for details).       Final Clinical Impressions(s) / ED Diagnoses   Final diagnoses:  Muscle strain  Acute right-sided low back pain without sciatica    ED Discharge Orders         Ordered    methocarbamol (ROBAXIN) 500 MG tablet  2 times daily     11/18/17 1341    meloxicam (MOBIC) 7.5 MG tablet  Daily     11/18/17 1341        An After Visit Summary was printed and given to the patient.    Fransico Meadow, [place] 11/18/17 1423    Nat Christen, MD 11/18/17 [phone]

## 2017-11-18 NOTE — ED Triage Notes (Signed)
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

## 2017-11-18 NOTE — ED Notes (Signed)
Out of bed to BR, NAD

## 2017-11-18 NOTE — Discharge Instructions (Signed)
Return if any problems.  Ice to area of pain

## 2017-11-22 ENCOUNTER — Other Ambulatory Visit: Payer: Self-pay

## 2017-11-22 ENCOUNTER — Emergency Department (HOSPITAL_COMMUNITY)
Admission: EM | Admit: 2017-11-22 | Discharge: 2017-11-22 | Disposition: A | Discharge status: Home / Self Care | Payer: Medicaid - Out of State | Attending: Emergency Medicine | Admitting: Emergency Medicine

## 2017-11-22 ENCOUNTER — Encounter (HOSPITAL_COMMUNITY): Payer: Self-pay | Admitting: Emergency Medicine

## 2017-11-22 DIAGNOSIS — Y990 Civilian activity done for income or pay: Secondary | ICD-10-CM | POA: Insufficient documentation

## 2017-11-22 DIAGNOSIS — Z79899 Other long term (current) drug therapy: Secondary | ICD-10-CM | POA: Insufficient documentation

## 2017-11-22 DIAGNOSIS — Z9049 Acquired absence of other specified parts of digestive tract: Secondary | ICD-10-CM | POA: Insufficient documentation

## 2017-11-22 DIAGNOSIS — S29019A Strain of muscle and tendon of unspecified wall of thorax, initial encounter: Secondary | ICD-10-CM | POA: Diagnosis not present

## 2017-11-22 DIAGNOSIS — S29012A Strain of muscle and tendon of back wall of thorax, initial encounter: Secondary | ICD-10-CM

## 2017-11-22 DIAGNOSIS — I10 Essential (primary) hypertension: Secondary | ICD-10-CM | POA: Diagnosis not present

## 2017-11-22 DIAGNOSIS — X500XXA Overexertion from strenuous movement or load, initial encounter: Secondary | ICD-10-CM | POA: Insufficient documentation

## 2017-11-22 DIAGNOSIS — S299XXA Unspecified injury of thorax, initial encounter: Secondary | ICD-10-CM | POA: Diagnosis present

## 2017-11-22 DIAGNOSIS — Y929 Unspecified place or not applicable: Secondary | ICD-10-CM | POA: Diagnosis not present

## 2017-11-22 DIAGNOSIS — Y9389 Activity, other specified: Secondary | ICD-10-CM | POA: Diagnosis not present

## 2017-11-22 MED ORDER — PREDNISONE 50 MG PO TABS: 60.0000 mg | ORAL_TABLET | Freq: Once | ORAL | Status: DC

## 2017-11-22 MED ORDER — PREDNISONE 10 MG PO TABS: ORAL_TABLET | ORAL | 0 refills | Status: DC
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
## 2017-11-22 NOTE — ED Provider Notes (Signed)
[facility] EMERGENCY DEPARTMENT Provider Note   CSN: [id number] Arrival date & time: 11/22/17  1457     History   Chief Complaint Chief Complaint  Patient presents with  . Back Pain  . Shoulder Pain    HPI Diana Fox is a 47 y.o. female with a history of intermittent problems with low back pain presenting with persistent pain across her bilateral upper shoulders since she had a lifting injury at work last Friday.  She was seen here initially for this injury and was placed on meloxicam and Mobic, reports these medicines make her drowsy but have not improved her pain.  She denies midline neck or back pain, denies chest pain, shortness of breath or pleuritic character of pain.  She does have increased pain with left and right cervical rotation.  Denies weakness or numbness in her upper extremities.  She has used a heating pad intermittently and also has applied muscle rub.  She denies worsening of her symptoms, but denies improvement as well.  She has not seen her PCP for this problem.  She is desirous of returning to work however will require a work note in order to return.  HPI  Past Medical History:  Diagnosis Date  . Anemia   . Back pain   . Hypertension     There are no active problems to display for this patient.   Past Surgical History:  Procedure Laterality Date  . CHOLECYSTECTOMY       OB History   None      Home Medications    Prior to Admission medications   Medication Sig Start Date End Date Taking? Authorizing Provider  azithromycin (ZITHROMAX) 250 MG tablet Take first 2 tablets together, then 1 every day until finished. 10/10/16   Triplett, Tammy, PA-C  loratadine (CLARITIN) 10 MG tablet Take 10 mg by mouth daily.    [provider]  methocarbamol (ROBAXIN) 500 MG tablet Take 1 tablet (500 mg total) by mouth 2 (two) times daily. 11/18/17   Fransico Meadow, PA-C  mupirocin nasal ointment (BACTROBAN) 2 % Apply to affected area TID x 10 days  01/21/16   Triplett, Tammy, PA-C  nebivolol (BYSTOLIC) 10 MG tablet Take 1 tablet (10 mg total) by mouth daily. 08/10/15   Lily Kocher, PA-C  predniSONE (DELTASONE) 10 MG tablet Take 6 tablets day one, 5 tablets day two, 4 tablets day three, 3 tablets day four, 2 tablets day five, then 1 tablet day six 11/22/17   Jasminne Mealy, Almyra Free, PA-C  traMADol (ULTRAM) 50 MG tablet Take 1 tablet (50 mg total) by mouth every 6 (six) hours as needed. 05/09/17   Lily Kocher, PA-C    Family History No family history on file.  Social History Social History   Tobacco Use  . Smoking status: Never Smoker  . Smokeless tobacco: Never Used  Substance Use Topics  . Alcohol use: No  . Drug use: No     Allergies   Motrin [ibuprofen]; Lisinopril; and Sudafed [pseudoephedrine hcl]   Review of Systems Review of Systems  Constitutional: Negative for fever.  Musculoskeletal: Positive for myalgias. Negative for arthralgias and joint swelling.  Neurological: Negative for weakness and numbness.     Physical Exam Updated Vital Signs BP 137/79 (BP Location: Right Arm)   Pulse 77   Temp 98.4 F (36.9 C) (Oral)   Resp 18   Ht 5\' 5"  (1.651 m)   Wt 75.3 kg   LMP 11/18/2017   SpO2 100%  BMI 27.62 kg/m   Physical Exam  Constitutional: She appears well-developed and well-nourished.  HENT:  Head: Atraumatic.  Neck: Normal range of motion. Neck supple. Muscular tenderness present. No spinous process tenderness present. No neck rigidity. No edema, no erythema and normal range of motion present.    Tender to palpation bilaterally across upper trapezius musculature.  She has no midline cervical or thoracic tenderness, edema or deformity.  No erythema, no rash.  Cardiovascular:  Pulses equal bilaterally  Musculoskeletal: She exhibits tenderness.  Neurological: She is alert. She has normal strength. She displays normal reflexes. No sensory deficit.  Reflex Scores:      Bicep reflexes are 2+ on the right side  and 2+ on the left side. Equal grip strength.  Skin: Skin is warm and dry.  Psychiatric: She has a normal mood and affect.     ED Treatments / Results  Labs (all labs ordered are listed, but only abnormal results are displayed) Labs Reviewed - No data to display  EKG None  Radiology No results found.  Procedures Procedures (including critical care time)  Medications Ordered in ED Medications  predniSONE (DELTASONE) tablet 60 mg (has no administration in time range)     Initial Impression / Assessment and Plan / ED Course  I have reviewed the triage vital signs and the nursing notes.  Pertinent labs & imaging results that were available during my care of the patient were reviewed by me and considered in my medical decision making (see chart for details).     No neuro deficit on exam or by history to suggest emergent or surgical presentation. Exam c/w muscle strain.  Discussed worsened sx that should prompt immediate re-evaluatio, otherwise plan to f/u with pcp for recheck x 1 week.        Final Clinical Impressions(s) / ED Diagnoses   Final diagnoses:  Upper back strain, initial encounter    ED Discharge Orders         Ordered    predniSONE (DELTASONE) 10 MG tablet     11/22/17 1547           Evalee Jefferson, PA-C 11/22/17 1603    Nat Christen, MD 11/22/17 [phone]

## 2017-11-22 NOTE — ED Notes (Signed)
Evalee Jefferson, PA informed pt did not get Prednisone, computer sent out.

## 2017-11-22 NOTE — Discharge Instructions (Addendum)
Continue taking the methocarbamol which is a muscle relaxer.  In place of the Mobic, take the prednisone as prescribed.  You have been given today's dose so do not take your next dose until tomorrow.  You may continue using a heating pad 20 minutes several times daily.  Plan a recheck by your primary doctor if your symptoms are not improving with today's treatment.  You may return to work, but avoid those activities that make your symptoms worse.

## 2017-12-22 ENCOUNTER — Emergency Department (HOSPITAL_COMMUNITY)
Admission: EM | Admit: 2017-12-22 | Discharge: 2017-12-22 | Disposition: A | Discharge status: Home / Self Care | Payer: Medicaid - Out of State | Attending: Emergency Medicine | Admitting: Emergency Medicine

## 2017-12-22 ENCOUNTER — Other Ambulatory Visit: Payer: Self-pay

## 2017-12-22 ENCOUNTER — Encounter (HOSPITAL_COMMUNITY): Payer: Self-pay

## 2017-12-22 DIAGNOSIS — K041 Necrosis of pulp: Secondary | ICD-10-CM | POA: Insufficient documentation

## 2017-12-22 DIAGNOSIS — I10 Essential (primary) hypertension: Secondary | ICD-10-CM | POA: Insufficient documentation

## 2017-12-22 DIAGNOSIS — Z79899 Other long term (current) drug therapy: Secondary | ICD-10-CM | POA: Diagnosis not present

## 2017-12-22 DIAGNOSIS — K0401 Reversible pulpitis: Secondary | ICD-10-CM

## 2017-12-22 DIAGNOSIS — K0889 Other specified disorders of teeth and supporting structures: Secondary | ICD-10-CM | POA: Insufficient documentation

## 2017-12-22 MED ORDER — PENICILLIN V POTASSIUM 500 MG PO TABS: 500.0000 mg | ORAL_TABLET | Freq: Four times a day (QID) | ORAL | 0 refills | Status: AC

## 2017-12-22 MED ORDER — BENZOCAINE 10 % MT GEL: 1.0000 "application " | OROMUCOSAL | 0 refills | Status: DC | PRN

## 2017-12-22 NOTE — ED Provider Notes (Signed)
[facility] EMERGENCY DEPARTMENT Provider Note   CSN: [id number] Arrival date & time: 12/22/17  1138     History   Chief Complaint Chief Complaint  Patient presents with  . Dental Pain    HPI Diana Fox is a 47 y.o. female.  Diana Fox is a 47 y.o. Female history of hypertension, and anemia, who presents to the emergency department for evaluation of dental pain.  She reports that 3 days ago she was eating and seemed to break 1 of her left upper teeth, she now has redness and swelling of the gum surrounding that and is concerned for dental abscess.  She reports a moderate pain that is a constant throbbing ache in this region.  She does not have any swelling or pain under the tongue, no difficulty swallowing or breathing.  Denies any fevers or chills, no nausea or vomiting.  She reports she has a dentist appointment coming up on the 20th, but they could not see her any sooner and she was concerned for forming abscess.     Past Medical History:  Diagnosis Date  . Anemia   . Back pain   . Hypertension     There are no active problems to display for this patient.   Past Surgical History:  Procedure Laterality Date  . CHOLECYSTECTOMY       OB History   None      Home Medications    Prior to Admission medications   Medication Sig Start Date End Date Taking? Authorizing Provider  azithromycin (ZITHROMAX) 250 MG tablet Take first 2 tablets together, then 1 every day until finished. 10/10/16   Triplett, Tammy, PA-C  loratadine (CLARITIN) 10 MG tablet Take 10 mg by mouth daily.    [provider]  methocarbamol (ROBAXIN) 500 MG tablet Take 1 tablet (500 mg total) by mouth 2 (two) times daily. 11/18/17   Fransico Meadow, PA-C  mupirocin nasal ointment (BACTROBAN) 2 % Apply to affected area TID x 10 days 01/21/16   Triplett, Tammy, PA-C  nebivolol (BYSTOLIC) 10 MG tablet Take 1 tablet (10 mg total) by mouth daily. 08/10/15   Lily Kocher, PA-C    predniSONE (DELTASONE) 10 MG tablet Take 6 tablets day one, 5 tablets day two, 4 tablets day three, 3 tablets day four, 2 tablets day five, then 1 tablet day six 11/22/17   Idol, Almyra Free, PA-C  traMADol (ULTRAM) 50 MG tablet Take 1 tablet (50 mg total) by mouth every 6 (six) hours as needed. 05/09/17   Lily Kocher, PA-C    Family History No family history on file.  Social History Social History   Tobacco Use  . Smoking status: Never Smoker  . Smokeless tobacco: Never Used  Substance Use Topics  . Alcohol use: No  . Drug use: No     Allergies   Motrin [ibuprofen]; Lisinopril; and Sudafed [pseudoephedrine hcl]   Review of Systems Review of Systems  Constitutional: Negative for chills and fever.  HENT: Positive for dental problem. Negative for facial swelling, sore throat and trouble swallowing.   Eyes: Negative for visual disturbance.  Respiratory: Negative for shortness of breath and stridor.   Musculoskeletal: Negative for neck pain and neck stiffness.  Skin: Negative for color change and rash.  Neurological: Negative for headaches.     Physical Exam Updated Vital Signs BP (!) 145/70   Pulse 81   Temp 98.2 F (36.8 C) (Oral)   Resp 20   Ht 5\' 5"  (1.651 m)  Wt 73 kg   LMP 12/06/2017   SpO2 100%   BMI 26.79 kg/m   Physical Exam  Constitutional: She appears well-developed and well-nourished. No distress.  HENT:  Redness and swelling over the gums surrounding the left upper incisor, there is a small area of abscess with no drainage, no tenderness or swelling under the tongue, posterior oropharynx is clear and moist patient is tolerating secretions, normal phonation, no trismus, no torticollis.  No appreciable facial swelling.  Neck: Neck supple.  No stridor  Pulmonary/Chest: Effort normal. No respiratory distress.  Skin: Skin is warm and dry. Capillary refill takes less than 2 seconds. She is not diaphoretic.  Psychiatric: She has a normal mood and affect. Her  behavior is normal.  Nursing note and vitals reviewed.    ED Treatments / Results  Labs (all labs ordered are listed, but only abnormal results are displayed) Labs Reviewed - No data to display  EKG None  Radiology No results found.  Procedures Procedures (including critical care time)  Medications Ordered in ED Medications - No data to display   Initial Impression / Assessment and Plan / ED Course  I have reviewed the triage vital signs and the nursing notes.  Pertinent labs & imaging results that were available during my care of the patient were reviewed by me and considered in my medical decision making (see chart for details).  Patient with toothache.  Is a very small possible abscess, and I discussed with the patient drainage versus trying antibiotics first, and patient elects to try antibiotics..  Exam unconcerning for Ludwig's angina or spread of infection.  Will treat with penicillin and anti-inflammatories medicine.  Urged patient to follow-up with dentist, she has dental appointment in 5 days.  Return precautions discussed.  Final Clinical Impressions(s) / ED Diagnoses   Final diagnoses:  Pain, dental  Pulpitis    ED Discharge Orders         Ordered    penicillin v potassium (VEETID) 500 MG tablet  4 times daily     12/22/17 1335    benzocaine (ORAJEL) 10 % mucosal gel  As needed     12/22/17 1335           Jacqlyn Larsen, [place] 12/22/17 [place], [place], DO 12/25/17 2215

## 2017-12-22 NOTE — Discharge Instructions (Signed)
Please take entire course of antibiotics as directed.  Continue using Tylenol, as well as prescribed Orajel for pain.  You will need to follow-up with your dentist for continued management of this.  Return to the emergency department for fevers, swelling or pain under the tongue or in the neck, difficulty breathing or swallowing or any other new or concerning symptoms.

## 2017-12-22 NOTE — ED Triage Notes (Signed)
Pt reports that she was eating and broke a tooth approx 3 days ago. Pt noted to have abscess like area upper left gum

## 2018-11-02 ENCOUNTER — Telehealth: Payer: Self-pay | Admitting: Adult Health

## 2018-11-02 NOTE — Telephone Encounter (Signed)
Tried to reach the patient to remind her of her appointment/restrictions, mailbox is full. 

## 2018-11-05 ENCOUNTER — Encounter: Payer: Medicaid - Out of State | Admitting: Adult Health

## 2019-12-23 ENCOUNTER — Emergency Department (HOSPITAL_COMMUNITY)
Admission: EM | Admit: 2019-12-23 | Discharge: 2019-12-23 | Disposition: A | Discharge status: Home / Self Care | Payer: PRIVATE HEALTH INSURANCE | Attending: Emergency Medicine | Admitting: Emergency Medicine

## 2019-12-23 ENCOUNTER — Other Ambulatory Visit: Payer: Self-pay

## 2019-12-23 ENCOUNTER — Encounter (HOSPITAL_COMMUNITY): Payer: Self-pay

## 2019-12-23 DIAGNOSIS — M67833 Other specified disorders of tendon, right wrist: Secondary | ICD-10-CM | POA: Insufficient documentation

## 2019-12-23 DIAGNOSIS — I10 Essential (primary) hypertension: Secondary | ICD-10-CM | POA: Diagnosis not present

## 2019-12-23 DIAGNOSIS — Z79899 Other long term (current) drug therapy: Secondary | ICD-10-CM | POA: Insufficient documentation

## 2019-12-23 DIAGNOSIS — Z0279 Encounter for issue of other medical certificate: Secondary | ICD-10-CM | POA: Diagnosis present

## 2019-12-23 DIAGNOSIS — M778 Other enthesopathies, not elsewhere classified: Secondary | ICD-10-CM

## 2019-12-23 MED ORDER — NAPROXEN 500 MG PO TABS: 500.0000 mg | ORAL_TABLET | Freq: Two times a day (BID) | ORAL | 0 refills | Status: DC

## 2019-12-23 NOTE — Discharge Instructions (Signed)
Apply ice packs on/off to your wrist.  Wear the brace while at work or when lifting.  Follow-up with Dr. Ruthe Mannan office in 1-2 weeks if not improving.

## 2019-12-23 NOTE — ED Provider Notes (Signed)
[facility] Provider Note   CSN: [id number] Arrival date & time: 12/23/19  1454     History Chief Complaint  Patient presents with  . Needs Work Note    Diana Fox is a 49 y.o. female.  HPI     Diana Fox is a 49 y.o. female who presents to the Emergency Department requesting a note to return to work.  She states that she injured her right wrist at work approximately 2 weeks ago.  A workers compensation claim was initiated and she was seen at [facility] on 12/20/19.  She was given a brace and NSAID which she did not fill.  She has a negative XR of the wrist and comes here today requesting a note to return to work.  She reports pain has improved slightly.  States the brace she was given initially is too tight and she requests another one. She denies numbness of her fingers, increased swelling    Past Medical History:  Diagnosis Date  . Anemia   . Back pain   . Hypertension     There are no problems to display for this patient.   Past Surgical History:  Procedure Laterality Date  . CHOLECYSTECTOMY       OB History   No obstetric history on file.     No family history on file.  Social History   Tobacco Use  . Smoking status: Never Smoker  . Smokeless tobacco: Never Used  Vaping Use  . Vaping Use: Never used  Substance Use Topics  . Alcohol use: No  . Drug use: No    Home Medications Prior to Admission medications   Medication Sig Start Date End Date Taking? Authorizing Provider  azithromycin (ZITHROMAX) 250 MG tablet Take first 2 tablets together, then 1 every day until finished. 10/10/16   Athziry Millican, PA-C  benzocaine (ORAJEL) 10 % mucosal gel Use as directed 1 application in the mouth or throat as needed for mouth pain. 12/22/17   Jacqlyn Larsen, PA-C  loratadine (CLARITIN) 10 MG tablet Take 10 mg by mouth daily.    [provider]  methocarbamol (ROBAXIN) 500 MG tablet Take 1 tablet (500 mg total) by mouth 2 (two) times  daily. 11/18/17   Fransico Meadow, PA-C  mupirocin nasal ointment (BACTROBAN) 2 % Apply to affected area TID x 10 days 01/21/16   Charese Abundis, PA-C  nebivolol (BYSTOLIC) 10 MG tablet Take 1 tablet (10 mg total) by mouth daily. 08/10/15   Lily Kocher, PA-C  predniSONE (DELTASONE) 10 MG tablet Take 6 tablets day one, 5 tablets day two, 4 tablets day three, 3 tablets day four, 2 tablets day five, then 1 tablet day six 11/22/17   Idol, Almyra Free, PA-C  traMADol (ULTRAM) 50 MG tablet Take 1 tablet (50 mg total) by mouth every 6 (six) hours as needed. 05/09/17   Lily Kocher, PA-C    Allergies    Motrin [ibuprofen], Lisinopril, and Sudafed [pseudoephedrine hcl]  Review of Systems   Review of Systems  Constitutional: Negative for chills and fever.  Musculoskeletal: Positive for arthralgias (right wrist pain). Negative for joint swelling and neck pain.  Skin: Negative for color change and wound.  Neurological: Negative for dizziness, weakness, numbness and headaches.    Physical Exam Updated Vital Signs BP (!) 148/86 (BP Location: Right Arm)   Pulse 82   Temp 98.4 F (36.9 C) (Oral)   Resp 16   Ht 5\' 5"  (1.651 m)  Wt 81.6 kg   SpO2 100%   BMI 29.95 kg/m   Physical Exam Vitals and nursing note reviewed.  Constitutional:      General: She is not in acute distress.    Appearance: She is well-developed.  HENT:     Head: Normocephalic and atraumatic.  Cardiovascular:     Rate and Rhythm: Normal rate and regular rhythm.     Heart sounds: Normal heart sounds.  Pulmonary:     Effort: Pulmonary effort is normal.     Breath sounds: Normal breath sounds.  Musculoskeletal:        General: Tenderness present. No swelling or deformity.     Comments: positive Finkelstein test of the right wrist, mild tenderness of the anatomical snuffbox.  No bony deformity noted.  No edema, erythema or excessive warmth.  No tenderness proximal to the wrist.    Skin:    General: Skin is warm.      Capillary Refill: Capillary refill takes less than 2 seconds.     Findings: No erythema or rash.  Neurological:     General: No focal deficit present.     Mental Status: She is alert.     Sensory: No sensory deficit.     Motor: No weakness or abnormal muscle tone.     ED Results / Procedures / Treatments   Labs (all labs ordered are listed, but only abnormal results are displayed) Labs Reviewed - No data to display  EKG None  Radiology No results found.  Procedures Procedures (including critical care time)  Medications Ordered in ED Medications - No data to display  ED Course  I have reviewed the triage vital signs and the nursing notes.  Pertinent labs & imaging results that were available during my care of the patient were reviewed by me and considered in my medical decision making (see chart for details).    MDM Rules/Calculators/A&P                          Pt here requesting another wrist brace and work note to return to work.  Previously diagnosed with tenosynovitis.  No concerning findings to suggest septic joint.  NV intact.  I will provide return work note and she agrees to wear brace for support.  Ortho f/u if not improving.     Final Clinical Impression(s) / ED Diagnoses Final diagnoses:  Tendonitis of wrist, right    Rx / DC Orders ED Discharge Orders    None       Kem Parkinson, PA-C 12/25/19 1536    Carmin Muskrat, MD 12/27/19 1530

## 2019-12-23 NOTE — ED Triage Notes (Signed)
Pt presents to ED for recheck on right wrist. Pt needs clearance to return to work.

## 2020-06-01 ENCOUNTER — Encounter (HOSPITAL_COMMUNITY): Payer: Self-pay | Admitting: *Deleted

## 2020-06-01 ENCOUNTER — Emergency Department (HOSPITAL_COMMUNITY)
Admission: EM | Admit: 2020-06-01 | Discharge: 2020-06-01 | Disposition: A | Discharge status: Home / Self Care | Payer: Medicaid Other | Attending: Emergency Medicine | Admitting: Emergency Medicine

## 2020-06-01 ENCOUNTER — Emergency Department (HOSPITAL_COMMUNITY): Payer: Medicaid Other

## 2020-06-01 DIAGNOSIS — I10 Essential (primary) hypertension: Secondary | ICD-10-CM | POA: Diagnosis not present

## 2020-06-01 DIAGNOSIS — Z79899 Other long term (current) drug therapy: Secondary | ICD-10-CM | POA: Insufficient documentation

## 2020-06-01 DIAGNOSIS — M25532 Pain in left wrist: Secondary | ICD-10-CM | POA: Insufficient documentation

## 2020-06-01 DIAGNOSIS — X58XXXA Exposure to other specified factors, initial encounter: Secondary | ICD-10-CM | POA: Insufficient documentation

## 2020-06-01 DIAGNOSIS — Y990 Civilian activity done for income or pay: Secondary | ICD-10-CM | POA: Insufficient documentation

## 2020-06-01 MED ORDER — PREDNISONE 10 MG PO TABS: ORAL_TABLET | ORAL | 0 refills | Status: AC

## 2020-06-01 NOTE — ED Triage Notes (Signed)
Left wrist pain, no known injury

## 2020-06-01 NOTE — ED Provider Notes (Signed)
[facility] EMERGENCY DEPARTMENT Provider Note   CSN: [id number] Arrival date & time: 06/01/20  1453     History Chief Complaint  Patient presents with  . Wrist Pain    Diana Fox is a 50 y.o. female.  HPI      Diana Fox is a 50 y.o. female, with a history of anemia, HTN, presenting to the ED with left wrist pain for at least the last month. 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  Pain is worse with use of the thumb and wrist. Denies known injury, falls, swelling, fever, numbness, weakness, or any other complaints.      Past Medical History:  Diagnosis Date  . Anemia   . Back pain   . Hypertension     There are no problems to display for this patient.   Past Surgical History:  Procedure Laterality Date  . CHOLECYSTECTOMY       OB History   No obstetric history on file.     No family history on file.  Social History   Tobacco Use  . Smoking status: Never Smoker  . Smokeless tobacco: Never Used  Vaping Use  . Vaping Use: Never used  Substance Use Topics  . Alcohol use: No  . Drug use: No    Home Medications Prior to Admission medications   Medication Sig Start Date End Date Taking? Authorizing Provider  predniSONE (DELTASONE) 10 MG tablet Take 4 tablets (40 mg total) by mouth daily for 5 days, THEN 3 tablets (30 mg total) daily for 1 day, THEN 2 tablets (20 mg total) daily for 1 day, THEN 1 tablet (10 mg total) daily for 1 day. 06/01/20 06/09/20 Yes Madeliene Tejera C, PA-C  azithromycin (ZITHROMAX) 250 MG tablet Take first 2 tablets together, then 1 every day until finished. 10/10/16   Triplett, Tammy, PA-C  benzocaine (ORAJEL) 10 % mucosal gel Use as directed 1 application in the mouth or throat as needed for mouth pain. 12/22/17   Jacqlyn Larsen, PA-C  loratadine (CLARITIN) 10 MG tablet Take 10 mg by mouth daily.    [provider]  methocarbamol (ROBAXIN) 500 MG tablet Take 1 tablet (500 mg total) by mouth 2 (two) times  daily. 11/18/17   Fransico Meadow, PA-C  mupirocin nasal ointment (BACTROBAN) 2 % Apply to affected area TID x 10 days 01/21/16   Triplett, Tammy, PA-C  naproxen (NAPROSYN) 500 MG tablet Take 1 tablet (500 mg total) by mouth 2 (two) times daily with a meal. 12/23/19   Triplett, Tammy, PA-C  nebivolol (BYSTOLIC) 10 MG tablet Take 1 tablet (10 mg total) by mouth daily. 08/10/15   Lily Kocher, PA-C  traMADol (ULTRAM) 50 MG tablet Take 1 tablet (50 mg total) by mouth every 6 (six) hours as needed. 05/09/17   Lily Kocher, PA-C    Allergies    Motrin [ibuprofen], Lisinopril, and Sudafed [pseudoephedrine hcl]  Review of Systems   Review of Systems  Constitutional: Negative for fever.  Musculoskeletal: Positive for arthralgias. Negative for joint swelling.  Neurological: Negative for weakness and numbness.    Physical Exam Updated Vital Signs BP (!) 147/93   Pulse 76   Temp 98.4 F (36.9 C) (Oral)   Resp 16   SpO2 98%   Physical Exam Vitals and nursing note reviewed.  Constitutional:      General: She is not in acute distress.    Appearance: She is well-developed. She is not diaphoretic.  HENT:     Head: Normocephalic and atraumatic.  Eyes:     Conjunctiva/sclera: Conjunctivae normal.  Cardiovascular:     Rate and Rhythm: Normal rate and regular rhythm.     Pulses:          Radial pulses are 2+ on the left side.  Pulmonary:     Effort: Pulmonary effort is normal.  Musculoskeletal:     Cervical back: Neck supple.     Comments: Tenderness along the left distal radial wrist.  Pain into the left thumb with attempting to touch the thumb to the pinky, but most of the range of motion of the thumb is pain-free. No noted swelling, deformity, color abnormality. No pain, tenderness, swelling, color abnormality in the rest of the wrist, hand, fingers.  Skin:    General: Skin is warm and dry.     Capillary Refill: Capillary refill takes less than 2 seconds.     Coloration: Skin is not  pale.  Neurological:     Mental Status: She is alert.     Comments: Sensation grossly intact to light touch through each of the nerve distributions of the bilateral upper extremities. Abduction and adduction of the fingers intact against resistance. Grip strength equal bilaterally. Supination and pronation intact against resistance. Strength 5/5 through the cardinal directions of the bilateral wrists. Strength 5/5 with flexion and extension of the bilateral elbows. Patient can hold the "OK" sign against resistance.  Psychiatric:        Behavior: Behavior normal.     ED Results / Procedures / Treatments   Labs (all labs ordered are listed, but only abnormal results are displayed) Labs Reviewed - No data to display  EKG None  Radiology DG Wrist Complete Left  Result Date: 06/01/2020 CLINICAL DATA:  Left wrist pain in the carpal region, no known injury. No surgeries. EXAM: LEFT WRIST - COMPLETE 3+ VIEW COMPARISON:  December 20, 2019 right wrist radiograph and left wrist radiographs November 02, 2007 FINDINGS: There is no evidence of fracture or dislocation. There is no evidence of arthropathy or other focal bone abnormality. Soft tissues are unremarkable. IMPRESSION: Negative. Electronically Signed   By: Dahlia Bailiff MD   On: 06/01/2020 17:19    Procedures Procedures   Medications Ordered in ED Medications - No data to display  ED Course  I have reviewed the triage vital signs and the nursing notes.  Pertinent labs & imaging results that were available during my care of the patient were reviewed by me and considered in my medical decision making (see chart for details).    MDM Rules/Calculators/A&P                          Patient presents with left wrist pain for at least the last month.  She does engage in repetitive movements of the wrist and thumb at work. I personally reviewed and interpreted the patient's x-ray.  No acute abnormalities noted.  Placed in a Velcro  splint for comfort. The patient was given instructions for home care as well as return precautions. Patient voices understanding of these instructions, accepts the plan, and is comfortable with discharge.   Final Clinical Impression(s) / ED Diagnoses Final diagnoses:  Left wrist pain    Rx / DC Orders ED Discharge Orders         Ordered    predniSONE (DELTASONE) 10 MG tablet        06/01/20 1756  Lorayne Bender, PA-C 06/01/20 1756    Milton Ferguson, MD 06/02/20 [phone]

## 2020-06-01 NOTE — Discharge Instructions (Signed)
You have been seen today for wrist pain. There were no acute abnormalities on the x-rays, including no sign of fracture or dislocation, however, there could be injuries to the soft tissues, such as the ligaments or tendons that are not seen on xrays. There could also be what are called occult fractures that are small fractures not seen on xray. Antiinflammatory medications: Take 600 mg of ibuprofen every 6 hours or 440 mg (over the counter dose) to 500 mg (prescription dose) of naproxen every 12 hours for the next 3 days. After this time, these medications may be used as needed for pain. Take these medications with food to avoid upset stomach. Choose only one of these medications, do not take them together. Acetaminophen (generic for Tylenol): Should you continue to have additional pain while taking the ibuprofen or naproxen, you may add in acetaminophen as needed. Your daily total maximum amount of acetaminophen from all sources should be limited to 4000mg /day for persons without liver problems, or 2000mg /day for those with liver problems. Prednisone: Take the prednisone, as prescribed, until finished. If you are a diabetic, please know prednisone can raise your blood sugar temporarily. Ice: May apply ice to the area over the next 24 hours for 15 minutes at a time to reduce swelling. Elevation: Keep the extremity elevated as often as possible to reduce pain and inflammation. Support: Wear the wrist splint for support and comfort. Wear this until pain resolves.  Follow up: If symptoms are improving, you may follow up with your primary care provider for any continued management. If symptoms are not starting to improve within a week, you should follow up with the orthopedic specialist within two weeks. Return: Return to the ED for numbness, weakness, increasing pain, overall worsening symptoms, loss of function, or if symptoms are not improving, you have tried to follow up with the orthopedic specialist, and  have been unable to do so.  For prescription assistance, may try using prescription discount sites or apps, such as goodrx.com or Good Rx smart phone app.

## 2020-06-01 NOTE — ED Notes (Signed)
No Velcro thumb spica available,  ACE wrap in place instead

## 2020-11-16 ENCOUNTER — Other Ambulatory Visit: Payer: Self-pay

## 2020-11-16 ENCOUNTER — Ambulatory Visit
Admission: EM | Admit: 2020-11-16 | Discharge: 2020-11-16 | Disposition: A | Discharge status: Home / Self Care | Payer: Medicaid Other

## 2020-11-16 DIAGNOSIS — H9201 Otalgia, right ear: Secondary | ICD-10-CM

## 2020-11-16 DIAGNOSIS — H6981 Other specified disorders of Eustachian tube, right ear: Secondary | ICD-10-CM | POA: Diagnosis not present

## 2020-11-16 MED ORDER — FEXOFENADINE HCL 180 MG PO TABS: 180.0000 mg | ORAL_TABLET | Freq: Every day | ORAL | 0 refills | Status: AC

## 2020-11-16 MED ORDER — PSEUDOEPHEDRINE HCL 30 MG PO TABS: 30.0000 mg | ORAL_TABLET | Freq: Three times a day (TID) | ORAL | 0 refills | Status: DC | PRN

## 2020-11-16 MED ORDER — FLUTICASONE PROPIONATE 50 MCG/ACT NA SUSP: 2.0000 | Freq: Every day | NASAL | 12 refills | Status: AC

## 2020-11-16 NOTE — ED Triage Notes (Signed)
Pt reports sudden R ear pain starting today.  No drainage from ear.  No changes in hearing.  Has been dealing with allergies lately.

## 2020-11-16 NOTE — ED Provider Notes (Signed)
[place]   MRN: [id number] DOB: 16-Aug-1970  Subjective:   Diana Fox is a 50 y.o. female presenting for 1 day history of acute onset right ear pain. Has had difficulty with her allergies.  Has not taken any medications for this. No throat pain, ear drainage, tinnitus, chest pain, shob.  Had a COVID test and was negative, does not want a repeat.  No current facility-administered medications for this encounter.  Current Outpatient Medications:    Multiple Vitamin (MULTIVITAMIN) tablet, Take 1 tablet by mouth daily., Disp: , Rfl:    nebivolol (BYSTOLIC) 10 MG tablet, Take 1 tablet (10 mg total) by mouth daily., Disp: 30 tablet, Rfl: 1   azithromycin (ZITHROMAX) 250 MG tablet, Take first 2 tablets together, then 1 every day until finished., Disp: 6 tablet, Rfl: 0   benzocaine (ORAJEL) 10 % mucosal gel, Use as directed 1 application in the mouth or throat as needed for mouth pain., Disp: 5.3 g, Rfl: 0   loratadine (CLARITIN) 10 MG tablet, Take 10 mg by mouth daily., Disp: , Rfl:    methocarbamol (ROBAXIN) 500 MG tablet, Take 1 tablet (500 mg total) by mouth 2 (two) times daily., Disp: 20 tablet, Rfl: 0   mupirocin nasal ointment (BACTROBAN) 2 %, Apply to affected area TID x 10 days, Disp: 15 g, Rfl: 0   naproxen (NAPROSYN) 500 MG tablet, Take 1 tablet (500 mg total) by mouth 2 (two) times daily with a meal., Disp: 20 tablet, Rfl: 0   traMADol (ULTRAM) 50 MG tablet, Take 1 tablet (50 mg total) by mouth every 6 (six) hours as needed., Disp: 10 tablet, Rfl: 0   Allergies  Allergen Reactions   Motrin [Ibuprofen] Itching   Lisinopril     cough   Sudafed [Pseudoephedrine Hcl] Other (See Comments)    Hypertension    Past Medical History:  Diagnosis Date   Anemia    Back pain    Hypertension      Past Surgical History:  Procedure Laterality Date   CHOLECYSTECTOMY      Family History  Problem Relation Age of Onset   Hypertension Mother    Hypertension Father      Social History   Tobacco Use   Smoking status: Never   Smokeless tobacco: Never  Vaping Use   Vaping Use: Never used  Substance Use Topics   Alcohol use: No   Drug use: No    ROS   Objective:   Vitals: BP (!) 154/89 (BP Location: Right Arm) Comment: did not take BP med today  Pulse 85   Temp 98.7 F (37.1 C) (Oral)   Resp 18   LMP 11/02/2020   SpO2 100%   Physical Exam Constitutional:      General: She is not in acute distress.    Appearance: Normal appearance. She is well-developed. She is not ill-appearing, toxic-appearing or diaphoretic.  HENT:     Head: Normocephalic and atraumatic.     Right Ear: Tympanic membrane, ear canal and external ear normal. No drainage or tenderness. No middle ear effusion. There is no impacted cerumen. Tympanic membrane is not erythematous.     Left Ear: Tympanic membrane, ear canal and external ear normal. No drainage or tenderness.  No middle ear effusion. There is no impacted cerumen. Tympanic membrane is not erythematous.     Nose: Nose normal. No congestion or rhinorrhea.     Mouth/Throat:     Mouth: Mucous membranes are moist. No oral lesions.  Pharynx: No pharyngeal swelling, oropharyngeal exudate, posterior oropharyngeal erythema or uvula swelling.     Tonsils: No tonsillar exudate or tonsillar abscesses.  Eyes:     General: No scleral icterus.       Right eye: No discharge.        Left eye: No discharge.     Extraocular Movements: Extraocular movements intact.     Right eye: Normal extraocular motion.     Left eye: Normal extraocular motion.     Conjunctiva/sclera: Conjunctivae normal.     Pupils: Pupils are equal, round, and reactive to light.  Cardiovascular:     Rate and Rhythm: Normal rate and regular rhythm.     Pulses: Normal pulses.     Heart sounds: Normal heart sounds. No murmur heard.   No friction rub. No gallop.  Pulmonary:     Effort: Pulmonary effort is normal. No respiratory distress.     Breath  sounds: Normal breath sounds. No stridor. No wheezing, rhonchi or rales.  Musculoskeletal:     Cervical back: Normal range of motion and neck supple.  Lymphadenopathy:     Cervical: No cervical adenopathy.  Skin:    General: Skin is warm and dry.     Findings: No rash.  Neurological:     General: No focal deficit present.     Mental Status: She is alert and oriented to person, place, and time.  Psychiatric:        Mood and Affect: Mood normal.        Behavior: Behavior normal.        Thought Content: Thought content normal.        Judgment: Judgment normal.    Assessment and Plan :   PDMP not reviewed this encounter.  1. Eustachian tube dysfunction, right   2. Right ear pain     Unremarkable ENT exam.  Will use conservative management for what I suspect is eustachian tube dysfunction especially in light of her having difficulty with her allergies that are not treated.  Recommended starting Flonase, Allegra, Sudafed. Counseled patient on potential for adverse effects with medications prescribed/recommended today, ER and return-to-clinic precautions discussed, patient verbalized understanding.    Jaynee Eagles, [place] 11/17/20 [phone]

## 2020-11-17 ENCOUNTER — Encounter: Payer: Self-pay | Admitting: Urgent Care

## 2021-01-07 ENCOUNTER — Other Ambulatory Visit: Payer: Self-pay

## 2021-01-07 ENCOUNTER — Encounter (HOSPITAL_COMMUNITY): Payer: Self-pay

## 2021-01-07 ENCOUNTER — Emergency Department (HOSPITAL_COMMUNITY)
Admission: EM | Admit: 2021-01-07 | Discharge: 2021-01-07 | Disposition: A | Discharge status: Home / Self Care | Payer: Medicaid Other | Attending: Emergency Medicine | Admitting: Emergency Medicine

## 2021-01-07 DIAGNOSIS — R82998 Other abnormal findings in urine: Secondary | ICD-10-CM | POA: Diagnosis present

## 2021-01-07 DIAGNOSIS — I10 Essential (primary) hypertension: Secondary | ICD-10-CM | POA: Diagnosis not present

## 2021-01-07 DIAGNOSIS — Z79899 Other long term (current) drug therapy: Secondary | ICD-10-CM | POA: Diagnosis not present

## 2021-01-07 DIAGNOSIS — R3989 Other symptoms and signs involving the genitourinary system: Secondary | ICD-10-CM

## 2021-01-07 LAB — URINALYSIS, ROUTINE W REFLEX MICROSCOPIC
Bilirubin Urine: NEGATIVE
Glucose, UA: NEGATIVE mg/dL
Ketones, ur: NEGATIVE mg/dL
Leukocytes,Ua: NEGATIVE
Nitrite: NEGATIVE
Protein, ur: NEGATIVE mg/dL
Specific Gravity, Urine: 1.02 (ref 1.005–1.030)
pH: 6 (ref 5.0–8.0)

## 2021-01-07 LAB — PREGNANCY, URINE: Preg Test, Ur: NEGATIVE

## 2021-01-07 LAB — URINALYSIS, MICROSCOPIC (REFLEX)

## 2021-01-07 NOTE — ED Provider Notes (Signed)
[facility] EMERGENCY DEPARTMENT Provider Note   CSN: [id number] Arrival date & time: 01/07/21  1839     History Chief Complaint  Patient presents with   dark urine    Now resolved    Diana Fox is a 50 y.o. female.  HPI 50 year old female presents with dark urine. Last week she had diarrhea for about 48 hours and her urine became dark. Was instructed by PCP to drink water and gatorade. Urine is lighter than it was but still a darker yellow than typical.  Diarrhea has resolved.  She is able to keep down fluids.  No current abdominal pain, and no fevers.  No dysuria.  No lightheadedness.  Urine output is normal but the color still has not returned to normal.  Past Medical History:  Diagnosis Date   Anemia    Back pain    Hypertension     There are no problems to display for this patient.   Past Surgical History:  Procedure Laterality Date   CHOLECYSTECTOMY       OB History   No obstetric history on file.     Family History  Problem Relation Age of Onset   Hypertension Mother    Hypertension Father     Social History   Tobacco Use   Smoking status: Never   Smokeless tobacco: Never  Vaping Use   Vaping Use: Never used  Substance Use Topics   Alcohol use: No   Drug use: No    Home Medications Prior to Admission medications   Medication Sig Start Date End Date Taking? Authorizing Provider  azithromycin (ZITHROMAX) 250 MG tablet Take first 2 tablets together, then 1 every day until finished. 10/10/16   Triplett, Tammy, PA-C  benzocaine (ORAJEL) 10 % mucosal gel Use as directed 1 application in the mouth or throat as needed for mouth pain. 12/22/17   Jacqlyn Larsen, PA-C  fexofenadine (ALLEGRA) 180 MG tablet Take 1 tablet (180 mg total) by mouth daily. 11/16/20   Jaynee Eagles, PA-C  fluticasone (FLONASE) 50 MCG/ACT nasal spray Place 2 sprays into both nostrils daily. 11/16/20   Jaynee Eagles, PA-C  loratadine (CLARITIN) 10 MG tablet Take 10 mg by mouth daily.     [provider]  methocarbamol (ROBAXIN) 500 MG tablet Take 1 tablet (500 mg total) by mouth 2 (two) times daily. 11/18/17   Fransico Meadow, PA-C  Multiple Vitamin (MULTIVITAMIN) tablet Take 1 tablet by mouth daily.    [provider]  mupirocin nasal ointment (BACTROBAN) 2 % Apply to affected area TID x 10 days 01/21/16   Triplett, Tammy, PA-C  naproxen (NAPROSYN) 500 MG tablet Take 1 tablet (500 mg total) by mouth 2 (two) times daily with a meal. 12/23/19   Triplett, Tammy, PA-C  nebivolol (BYSTOLIC) 10 MG tablet Take 1 tablet (10 mg total) by mouth daily. 08/10/15   Lily Kocher, PA-C  pseudoephedrine (SUDAFED) 30 MG tablet Take 1 tablet (30 mg total) by mouth every 8 (eight) hours as needed for congestion. 11/16/20   Jaynee Eagles, PA-C  traMADol (ULTRAM) 50 MG tablet Take 1 tablet (50 mg total) by mouth every 6 (six) hours as needed. 05/09/17   Lily Kocher, PA-C    Allergies    Motrin [ibuprofen], Lisinopril, and Sudafed [pseudoephedrine hcl]  Review of Systems   Review of Systems  Constitutional:  Negative for fever.  Gastrointestinal:  Positive for diarrhea. Negative for abdominal pain and vomiting.  Genitourinary:  Negative for decreased  urine volume, dysuria and flank pain.  All other systems reviewed and are negative.  Physical Exam Updated Vital Signs BP (!) 161/90 (BP Location: Right Arm)   Pulse 93   Temp 97.8 F (36.6 C) (Oral)   Resp 16   Ht 5\' 5"  (1.651 m)   Wt 80.9 kg   SpO2 100%   BMI 29.67 kg/m   Physical Exam Vitals and nursing note reviewed.  Constitutional:      Appearance: She is well-developed.  HENT:     Head: Normocephalic and atraumatic.     Right Ear: External ear normal.     Left Ear: External ear normal.     Nose: Nose normal.  Eyes:     General:        Right eye: No discharge.        Left eye: No discharge.  Cardiovascular:     Rate and Rhythm: Normal rate and regular rhythm.     Heart sounds: Normal heart sounds.   Pulmonary:     Effort: Pulmonary effort is normal.     Breath sounds: Normal breath sounds.  Abdominal:     General: There is no distension.     Palpations: Abdomen is soft.     Tenderness: There is no abdominal tenderness. There is no right CVA tenderness or left CVA tenderness.  Skin:    General: Skin is warm and dry.  Neurological:     Mental Status: She is alert.  Psychiatric:        Mood and Affect: Mood is not anxious.    ED Results / Procedures / Treatments   Labs (all labs ordered are listed, but only abnormal results are displayed) Labs Reviewed  URINALYSIS, ROUTINE W REFLEX MICROSCOPIC - Abnormal; Notable for the following components:      Result Value   Hgb urine dipstick SMALL (*)    All other components within normal limits  URINALYSIS, MICROSCOPIC (REFLEX) - Abnormal; Notable for the following components:   Bacteria, UA FEW (*)    All other components within normal limits  PREGNANCY, URINE    EKG None  Radiology No results found.  Procedures Procedures   Medications Ordered in ED Medications - No data to display  ED Course  I have reviewed the triage vital signs and the nursing notes.  Pertinent labs & imaging results that were available during my care of the patient were reviewed by me and considered in my medical decision making (see chart for details).    MDM Rules/Calculators/A&P                           Patient's UA is benign besides minimal hematuria. Patient relates that she just started spotting, which correlates with mild hematuria. Doubt rhabdomyolysis. I don't think bloodwork is necessary at this point. Discussed continuing good fluid intake, and with improving color I doubt emergent abdominal/retroperitoneal condition. Follow up with PCP.  Final Clinical Impression(s) / ED Diagnoses Final diagnoses:  Urine discoloration    Rx / DC Orders ED Discharge Orders     None        Sherwood Gambler, MD 01/07/21 2041

## 2021-01-07 NOTE — ED Triage Notes (Addendum)
Pt presents to Ed with c/o dark urine, pt called PCP and was instructed to drink Gatorade and water, pt did this and urine has gotten lighter, pt states, "I'm here because I want to know do I keep drinking gatorade?" Pt denies any pain or urinary symptoms.

## 2021-01-07 NOTE — Discharge Instructions (Signed)
Be sure to drink plenty of fluids.  If your urine becomes darker instead of lighter, tea-colored, if you develop painful urination, back or flank pain, abdominal pain, vomiting, fever, or any other new/concerning symptoms then return to the ER for evaluation.

## 2021-01-10 ENCOUNTER — Other Ambulatory Visit: Payer: Self-pay

## 2021-01-10 ENCOUNTER — Inpatient Hospital Stay (HOSPITAL_COMMUNITY)
Admission: EM | Admit: 2021-01-10 | Discharge: 2021-01-14 | DRG: 385 | Disposition: A | Discharge status: Home / Self Care | Payer: Medicaid Other | Attending: Family Medicine | Admitting: Family Medicine

## 2021-01-10 ENCOUNTER — Emergency Department (HOSPITAL_COMMUNITY): Payer: Medicaid Other

## 2021-01-10 DIAGNOSIS — K50013 Crohn's disease of small intestine with fistula: Principal | ICD-10-CM | POA: Diagnosis present

## 2021-01-10 DIAGNOSIS — K5732 Diverticulitis of large intestine without perforation or abscess without bleeding: Secondary | ICD-10-CM | POA: Diagnosis present

## 2021-01-10 DIAGNOSIS — D72829 Elevated white blood cell count, unspecified: Secondary | ICD-10-CM | POA: Diagnosis present

## 2021-01-10 DIAGNOSIS — D509 Iron deficiency anemia, unspecified: Secondary | ICD-10-CM | POA: Diagnosis present

## 2021-01-10 DIAGNOSIS — Z8249 Family history of ischemic heart disease and other diseases of the circulatory system: Secondary | ICD-10-CM

## 2021-01-10 DIAGNOSIS — I119 Hypertensive heart disease without heart failure: Secondary | ICD-10-CM | POA: Diagnosis present

## 2021-01-10 DIAGNOSIS — Z79891 Long term (current) use of opiate analgesic: Secondary | ICD-10-CM

## 2021-01-10 DIAGNOSIS — K5000 Crohn's disease of small intestine without complications: Secondary | ICD-10-CM

## 2021-01-10 DIAGNOSIS — J9811 Atelectasis: Secondary | ICD-10-CM | POA: Diagnosis present

## 2021-01-10 DIAGNOSIS — Z6829 Body mass index (BMI) 29.0-29.9, adult: Secondary | ICD-10-CM

## 2021-01-10 DIAGNOSIS — D75839 Thrombocytosis, unspecified: Secondary | ICD-10-CM | POA: Diagnosis present

## 2021-01-10 DIAGNOSIS — Z886 Allergy status to analgesic agent status: Secondary | ICD-10-CM

## 2021-01-10 DIAGNOSIS — K529 Noninfective gastroenteritis and colitis, unspecified: Secondary | ICD-10-CM

## 2021-01-10 DIAGNOSIS — Z79899 Other long term (current) drug therapy: Secondary | ICD-10-CM

## 2021-01-10 DIAGNOSIS — E876 Hypokalemia: Secondary | ICD-10-CM | POA: Diagnosis present

## 2021-01-10 DIAGNOSIS — R109 Unspecified abdominal pain: Secondary | ICD-10-CM | POA: Diagnosis present

## 2021-01-10 DIAGNOSIS — E871 Hypo-osmolality and hyponatremia: Secondary | ICD-10-CM | POA: Diagnosis present

## 2021-01-10 DIAGNOSIS — K56609 Unspecified intestinal obstruction, unspecified as to partial versus complete obstruction: Secondary | ICD-10-CM | POA: Diagnosis present

## 2021-01-10 DIAGNOSIS — Z9049 Acquired absence of other specified parts of digestive tract: Secondary | ICD-10-CM

## 2021-01-10 DIAGNOSIS — Z888 Allergy status to other drugs, medicaments and biological substances status: Secondary | ICD-10-CM

## 2021-01-10 DIAGNOSIS — K651 Peritoneal abscess: Secondary | ICD-10-CM | POA: Diagnosis present

## 2021-01-10 DIAGNOSIS — E8809 Other disorders of plasma-protein metabolism, not elsewhere classified: Secondary | ICD-10-CM | POA: Diagnosis present

## 2021-01-10 DIAGNOSIS — I10 Essential (primary) hypertension: Secondary | ICD-10-CM | POA: Diagnosis present

## 2021-01-10 DIAGNOSIS — R739 Hyperglycemia, unspecified: Secondary | ICD-10-CM | POA: Diagnosis present

## 2021-01-10 DIAGNOSIS — Z20822 Contact with and (suspected) exposure to covid-19: Secondary | ICD-10-CM | POA: Diagnosis present

## 2021-01-10 DIAGNOSIS — K632 Fistula of intestine: Secondary | ICD-10-CM | POA: Diagnosis present

## 2021-01-10 LAB — URINALYSIS, ROUTINE W REFLEX MICROSCOPIC

## 2021-01-10 LAB — COMPREHENSIVE METABOLIC PANEL
ALT: 35 U/L (ref 0–44)
AST: 25 U/L (ref 15–41)
Albumin: 3.2 g/dL — ABNORMAL LOW (ref 3.5–5.0)
Alkaline Phosphatase: 107 U/L (ref 38–126)
Anion gap: 9 (ref 5–15)
BUN: 8 mg/dL (ref 6–20)
CO2: 22 mmol/L (ref 22–32)
Calcium: 8.8 mg/dL — ABNORMAL LOW (ref 8.9–10.3)
Chloride: 102 mmol/L (ref 98–111)
Creatinine, Ser: 0.77 mg/dL (ref 0.44–1.00)
GFR, Estimated: >60 mL/min (ref >60)
Glucose, Bld: 126 mg/dL — ABNORMAL HIGH (ref 70–99)
Potassium: 3.8 mmol/L (ref 3.5–5.1)
Sodium: 133 mmol/L — ABNORMAL LOW (ref 135–145)
Total Bilirubin: 1.7 mg/dL — ABNORMAL HIGH (ref 0.3–1.2)
Total Protein: 7.4 g/dL (ref 6.5–8.1)

## 2021-01-10 LAB — URINALYSIS, MICROSCOPIC (REFLEX)

## 2021-01-10 LAB — CBC
HCT: 35.9 % — ABNORMAL LOW (ref 36.0–46.0)
Hemoglobin: 11.2 g/dL — ABNORMAL LOW (ref 12.0–15.0)
MCH: 24 pg — ABNORMAL LOW (ref 26.0–34.0)
MCHC: 31.2 g/dL (ref 30.0–36.0)
MCV: 77 fL — ABNORMAL LOW (ref 80.0–100.0)
Platelets: 588 10*3/uL — ABNORMAL HIGH (ref 150–400)
RBC: 4.66 MIL/uL (ref 3.87–5.11)
RDW: 15.6 % — ABNORMAL HIGH (ref 11.5–15.5)
WBC: 15.5 10*3/uL — ABNORMAL HIGH (ref 4.0–10.5)
nRBC: 0 % (ref 0.0–0.2)

## 2021-01-10 LAB — LIPASE, BLOOD: Lipase: 24 U/L (ref 11–51)

## 2021-01-10 MED ORDER — SODIUM CHLORIDE 0.9 % IV BOLUS
1000.0000 mL | Freq: Once | INTRAVENOUS | Status: AC
  Administered 2021-01-10: 21:00:00 1000 mL via INTRAVENOUS

## 2021-01-10 MED ORDER — MORPHINE SULFATE (PF) 4 MG/ML IV SOLN
2.0000 mg | Freq: Once | INTRAVENOUS | Status: AC
  Administered 2021-01-10: 21:00:00 2 mg via INTRAVENOUS
  Filled 2021-01-10: qty 1

## 2021-01-10 MED ORDER — METRONIDAZOLE 500 MG/100ML IV SOLN
500.0000 mg | Freq: Once | INTRAVENOUS | Status: AC
  Administered 2021-01-11: 500 mg via INTRAVENOUS
  Filled 2021-01-10: qty 100

## 2021-01-10 MED ORDER — MORPHINE SULFATE (PF) 4 MG/ML IV SOLN: 4.0000 mg | Freq: Once | INTRAVENOUS | Status: DC

## 2021-01-10 MED ORDER — CIPROFLOXACIN IN D5W 400 MG/200ML IV SOLN
400.0000 mg | Freq: Once | INTRAVENOUS | Status: AC
  Administered 2021-01-11: 400 mg via INTRAVENOUS
  Filled 2021-01-10: qty 200

## 2021-01-10 MED ORDER — IOHEXOL 300 MG/ML  SOLN
80.0000 mL | Freq: Once | INTRAMUSCULAR | Status: AC | PRN
  Administered 2021-01-10: 22:00:00 80 mL via INTRAVENOUS

## 2021-01-10 NOTE — ED Provider Notes (Addendum)
[facility] EMERGENCY DEPARTMENT Provider Note   CSN: [id number] Arrival date & time: 01/10/21  1712     History Chief Complaint  Patient presents with  . Abdominal Pain    Diana Fox is a 50 y.o. female [name] to the emergency department with a chief complaint of abdominal pain.  Patient reports that abdominal pain started on Thanksgiving.  Pain resolves 3 to 4 days after Thanksgiving.  Patient was pain-free until this Friday when it began.  Patient reports that pain is generalized over her entire abdomen.  Patient describes pain as "sharp and dull."  Pain is intermittent.  It is worse with movement.  Patient has not tried any modalities to alleviate her symptoms.  Patient rates pain 8/10 on the pain scale.  Patient reports that she had diarrhea with the initial onset of abdominal pain however has not had any diarrhea since then.  Patient reports that she has not been able to have a bowel movement or passed gas since Friday 12/2.  Denies any fevers, chills, nausea, vomiting, diarrhea, blood in stool, melena, dysuria, hematuria, urinary urgency.  Patient reports previous history of cholecystectomy.  Denies any history of inflammatory bowel disease.  Patient denies any illicit drug use or alcohol use.  LMP 12/2        Abdominal Pain Associated symptoms: no chest pain, no chills, no constipation, no diarrhea, no dysuria, no fever, no hematuria, no nausea, no shortness of breath, no vaginal bleeding, no vaginal discharge and no vomiting       Past Medical History:  Diagnosis Date  . Anemia   . Back pain   . Hypertension     There are no problems to display for this patient.   Past Surgical History:  Procedure Laterality Date  . CHOLECYSTECTOMY       OB History   No obstetric history on file.     Family History  Problem Relation Age of Onset  . Hypertension Mother   . Hypertension Father     Social History   Tobacco Use  . Smoking status: Never  . Smokeless  tobacco: Never  Vaping Use  . Vaping Use: Never used  Substance Use Topics  . Alcohol use: No  . Drug use: No    Home Medications Prior to Admission medications   Medication Sig Start Date End Date Taking? Authorizing Provider  Cholecalciferol 50 MCG (2000 UT) TABS Take by mouth.   Yes [provider]  fexofenadine (ALLEGRA) 180 MG tablet Take 1 tablet (180 mg total) by mouth daily. 11/16/20  Yes Jaynee Eagles, PA-C  fluticasone (FLONASE) 50 MCG/ACT nasal spray Place 2 sprays into both nostrils daily. 11/16/20  Yes Jaynee Eagles, PA-C  Multiple Vitamin (MULTIVITAMIN) tablet Take 1 tablet by mouth daily.   Yes [provider]  nebivolol (BYSTOLIC) 10 MG tablet Take 1 tablet (10 mg total) by mouth daily. 08/10/15  Yes Lily Kocher, PA-C  azithromycin (ZITHROMAX) 250 MG tablet Take first 2 tablets together, then 1 every day until finished. Patient not taking: Reported on 01/10/2021 10/10/16   Triplett, Tammy, PA-C  benzocaine (ORAJEL) 10 % mucosal gel Use as directed 1 application in the mouth or throat as needed for mouth pain. Patient not taking: Reported on 01/10/2021 12/22/17   Jacqlyn Larsen, PA-C  loratadine (CLARITIN) 10 MG tablet Take 10 mg by mouth daily. Patient not taking: Reported on 01/10/2021    [provider]  methocarbamol (ROBAXIN) 500 MG tablet Take 1 tablet (500  mg total) by mouth 2 (two) times daily. Patient not taking: Reported on 01/10/2021 11/18/17   Fransico Meadow, PA-C  mupirocin nasal ointment (BACTROBAN) 2 % Apply to affected area TID x 10 days Patient not taking: Reported on 01/10/2021 01/21/16   Kem Parkinson, PA-C  naproxen (NAPROSYN) 500 MG tablet Take 1 tablet (500 mg total) by mouth 2 (two) times daily with a meal. Patient not taking: Reported on 01/10/2021 12/23/19   Triplett, Tammy, PA-C  pseudoephedrine (SUDAFED) 30 MG tablet Take 1 tablet (30 mg total) by mouth every 8 (eight) hours as needed for congestion. Patient not taking:  Reported on 01/10/2021 11/16/20   Jaynee Eagles, PA-C  traMADol (ULTRAM) 50 MG tablet Take 1 tablet (50 mg total) by mouth every 6 (six) hours as needed. Patient not taking: Reported on 01/10/2021 05/09/17   Lily Kocher, PA-C    Allergies    Motrin [ibuprofen], Lisinopril, and Sudafed [pseudoephedrine hcl]  Review of Systems   Review of Systems  Constitutional:  Negative for chills and fever.  Eyes:  Negative for visual disturbance.  Respiratory:  Negative for shortness of breath.   Cardiovascular:  Negative for chest pain.  Gastrointestinal:  Positive for abdominal distention and abdominal pain. Negative for anal bleeding, blood in stool, constipation, diarrhea, nausea, rectal pain and vomiting.  Genitourinary:  Negative for difficulty urinating, dysuria, frequency, hematuria, urgency, vaginal bleeding, vaginal discharge and vaginal pain.  Musculoskeletal:  Negative for back pain and neck pain.  Skin:  Negative for color change and rash.  Neurological:  Negative for dizziness, syncope, light-headedness and headaches.  Psychiatric/Behavioral:  Negative for confusion.    Physical Exam Updated Vital Signs BP 132/81   Pulse 95   Temp 99.8 F (37.7 C) (Oral)   Resp (!) 26   Wt 80.7 kg   LMP 01/10/2021   SpO2 98%   BMI 29.62 kg/m   Physical Exam Vitals and nursing note reviewed.  Constitutional:      General: She is not in acute distress.    Appearance: She is not ill-appearing, toxic-appearing or diaphoretic.  HENT:     Head: Normocephalic.  Eyes:     General: No scleral icterus.       Right eye: No discharge.        Left eye: No discharge.  Cardiovascular:     Rate and Rhythm: Normal rate.  Pulmonary:     Effort: Pulmonary effort is normal.  Abdominal:     General: Abdomen is protuberant. Bowel sounds are increased. There is distension. There are no signs of injury.     Palpations: There is no mass or pulsatile mass.     Tenderness: There is generalized abdominal  tenderness. There is guarding. There is no right CVA tenderness, left CVA tenderness or rebound.     Hernia: There is no hernia in the umbilical area or ventral area.  Skin:    General: Skin is warm and dry.  Neurological:     General: No focal deficit present.     Mental Status: She is alert.  Psychiatric:        Behavior: Behavior is cooperative.    ED Results / Procedures / Treatments   Labs (all labs ordered are listed, but only abnormal results are displayed) Labs Reviewed  COMPREHENSIVE METABOLIC PANEL - Abnormal; Notable for the following components:      Result Value   Sodium 133 (*)    Glucose, Bld 126 (*)    Calcium 8.8 (*)  Albumin 3.2 (*)    Total Bilirubin 1.7 (*)    All other components within normal limits  CBC - Abnormal; Notable for the following components:   WBC 15.5 (*)    Hemoglobin 11.2 (*)    HCT 35.9 (*)    MCV 77.0 (*)    MCH 24.0 (*)    RDW 15.6 (*)    Platelets 588 (*)    All other components within normal limits  URINALYSIS, ROUTINE W REFLEX MICROSCOPIC - Abnormal; Notable for the following components:   Color, Urine ORANGE (*)    Glucose, UA   (*)    Value: TEST NOT REPORTED DUE TO COLOR INTERFERENCE OF URINE PIGMENT   Hgb urine dipstick   (*)    Value: TEST NOT REPORTED DUE TO COLOR INTERFERENCE OF URINE PIGMENT   Bilirubin Urine   (*)    Value: TEST NOT REPORTED DUE TO COLOR INTERFERENCE OF URINE PIGMENT   Ketones, ur   (*)    Value: TEST NOT REPORTED DUE TO COLOR INTERFERENCE OF URINE PIGMENT   Protein, ur   (*)    Value: TEST NOT REPORTED DUE TO COLOR INTERFERENCE OF URINE PIGMENT   Nitrite   (*)    Value: TEST NOT REPORTED DUE TO COLOR INTERFERENCE OF URINE PIGMENT   Leukocytes,Ua   (*)    Value: TEST NOT REPORTED DUE TO COLOR INTERFERENCE OF URINE PIGMENT   All other components within normal limits  URINALYSIS, MICROSCOPIC (REFLEX) - Abnormal; Notable for the following components:   Bacteria, UA MANY (*)    All other components  within normal limits  RESP PANEL BY RT-PCR (FLU A&B, COVID) ARPGX2  LIPASE, BLOOD    EKG None  Radiology CT ABDOMEN PELVIS W CONTRAST  Result Date: 01/10/2021 CLINICAL DATA:  Abdominal pain and pressure. EXAM: CT ABDOMEN AND PELVIS WITH CONTRAST TECHNIQUE: Multidetector CT imaging of the abdomen and pelvis was performed using the standard protocol following bolus administration of intravenous contrast. CONTRAST:  50mL OMNIPAQUE IOHEXOL 300 MG/ML  SOLN COMPARISON:  There is no prior CT for comparison. Limited comparison is available with a pelvic ultrasound 12/13/2018. FINDINGS: Lower chest: There are patchy opacities in the lower lobes which could be due to pneumonia or atelectasis. There is no pleural effusion. There is mild-to-moderate pan chamber cardiomegaly. Hepatobiliary: 21 cm in length slightly steatotic. Gallbladder is absent with postcholecystectomy extrahepatic bile duct prominence with common bile duct 8 mm without visible filling defect and slight intrahepatic biliary prominence. No mass enhancement is seen in the liver. Pancreas: Unremarkable. No pancreatic ductal dilatation or surrounding inflammatory changes. Spleen: Normal in size without focal abnormality. Adrenals/Urinary Tract: There is no adrenal mass, no focal abnormality of the renal cortex and no appreciable calculus or hydronephrosis. The bladder thickness is normal. Stomach/Bowel: The gastric wall and upper and mid abdominal small bowel are unremarkable without contrast. There is dilatation of some of the mid to lower abdominal small bowel up to 3.2 cm and relative decompression in the lower abdominal small bowel with occasional right lower quadrant segments showing mild thickening. A focal transitional segment was not seen. An approximately 10 cm length of the distal sigmoid colon demonstrates moderate circumferential wall thickening and adjacent inflammatory reaction. There is a 1.8 x 2.4 cm rim enhancing structure medial to  the diseased segment just anterior to L4-5 right of the midline which could represent a contained perforation or abutting necrotic lymph node. There are additional mildly prominent bilateral common iliac chain  nodes common both sides up to 1.1 cm short axis posterior to this process. There is suspected fistula to a small bowel loop at the proximal end of the diseased segment as seen on axial images 45-47 and also suspected on axial images 46-49. Inflammatory changes are also present in the mesentery at the sites of suspected fistulization. There is scattered fluid in the mesenteric folds in the mid to lower abdomen as well noted as well as mild perihepatic and paracolic gutter low-density free fluid, but there is no visible free air in the abdomen or the pelvis. The rest of the colonic wall is unremarkable. The appendix is normal in caliber. The terminal ileum is within normal limits. There is mild fecal stasis in the ascending and transverse colon. There are scattered diverticula along the left colon. Vascular/Lymphatic: No significant vascular findings. As above there are mildly prominent bilateral common iliac chain nodes and either a necrotic 2.5 x 1.8 cm lymph node or a contained perforation adjacent to the midportion of the diseased sigmoid segment described above. There is no further adenopathy visible. Reproductive: Enlarged fibroid uterus is noted as on the prior ultrasound. Largest fibroid is a calcified leiomyoma in the right body of uterus. The ovaries are follicular but do not appear enlarged. Other: Pelvic phleboliths. Mild mesenteric, upper abdominal and posterior pelvic ascites. No appreciable pneumoperitoneum. Small umbilical fat hernia. Musculoskeletal: No acute or significant osseous findings. 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 There are bilateral mildly enlarged common iliac chain nodes associated with this. This could be an acute on chronic diverticulitis or could be an inflamed carcinoma. There is mild associated ascites. 2. There are mildly dilated mid to lower abdominal small bowel segments up to 3.2 cm without visible transition and decompressed but occasionally thickened small bowel segments in the right lower abdomen. A low-grade small-bowel obstruction potentially related to the inflammatory process is possible and there could be additional small bowel enteritis in the right lower abdomen. Does the patient have any history of inflammatory bowel disease? 3. Mildly prominent slightly steatotic liver. 4. Post cholecystectomy biliary prominence without a visible biliary ductal filling defect. Laboratory and clinical correlation advised. 5. Enlarged fibroid uterus. 6. Umbilical fat hernia. 7. Cardiomegaly. 8. Opacities in the lower lobes which could be atelectasis, pneumonia or combination. Electronically Signed   By: Telford Nab M.D.   On: 01/10/2021 23:07    Procedures Procedures   Medications Ordered in ED Medications  sodium chloride 0.9 % bolus 1,000 mL (1,000 mLs Intravenous New Bag/Given 01/10/21 2120)  morphine 4 MG/ML injection 2 mg (2 mg Intravenous Given 01/10/21 2120)  iohexol (OMNIPAQUE) 300 MG/ML solution 80 mL (80 mLs Intravenous Contrast Given 01/10/21 2142)    ED Course  I have reviewed the triage vital signs and the nursing notes.  Pertinent labs & imaging results that were available during my care of the patient were reviewed by me and considered in my medical decision making (see chart for details).  Clinical Course as of 01/10/21 2354  Nancy Fetter  Jan 10, 2021  2354 Spoke to Dr. Jenetta Downer with gastroenterology who advised that gastroenterology will follow  patient on consult.  Recommended consulting general surgery due to concern for perforation.   [PB]    Clinical Course User Index [PB] Dyann Ruddle   MDM Rules/Calculators/A&P                           Alert 50 year old female no acute distress, nontoxic-appearing.  Presents to ED with chief complaint of abdominal pain.  She also endorses constipation, inability to produce flatulence, and abdominal distention.  Symptoms have been present over the last 3 days.  On exam abdomen is distended diffusely tender throughout abdomen with guarding.  CBC, CMP, lipase, urinalysis are ordered while patient was in triage.  We will add morphine, fluid bolus, and CT of abdomen and pelvis.  Due to patient's history of abdominal surgery combined with abdominal distention and constipation concern for possible small bowel obstruction.  CBC shows leukocytosis at 15.5. CMP shows increased improvement at 1.7. Patient within normal limits Urinalysis shows bacteria many, squamous epithelial cells 6-10, color orange.  Low suspicion for UTI patient has no urinary symptoms, suspect that bacteria seen is secondary to patient's menstrual period.  Reexamination patient's abdomen remains distended generalized tenderness throughout.  Patient reports that her pain did improve after receiving morphine.  Declines any additional pain medication.  CT abdomen pelvis shows diffusely thickened area of distal sigmoid colon with adjacent moderate inflammatory reaction.  Suspect due to sites of bowel fistulization as well as possible necrotic lymph node versus for/abscess.  Mildly dilated mid to lower abdominal small bowel segments up to 3.2 cm without visible transition and decompressed.  Due to findings on CT abdomen pelvis will consult gastroenterology as well as general surgery.  Patient care transferred to  Dr. Sedonia Small at the end of my shift. Patient presentation, ED course, and plan of care discussed with review of all pertinent labs and imaging. Please see his/her note for further details regarding further ED course and disposition.   Final Clinical Impression(s) / ED Diagnoses Final diagnoses:  None    Rx / DC Orders ED Discharge Orders     None        Loni Beckwith, PA-C 01/10/21 2347    Loni Beckwith, PA-C 01/10/21 2347    Maudie Flakes, MD 01/11/21 0430

## 2021-01-10 NOTE — ED Triage Notes (Signed)
Pt states that she has felt general pressure since this Am at 0430. The pressure in her abd makes her feel like she needs to belch or pass gas but is unable to do either. Last BM Friday, has tried omeprazole without relief. Denies blood in stool.  States feels like when she had gastritis years ago

## 2021-01-10 NOTE — ED Notes (Signed)
Patient transported to CT 

## 2021-01-11 ENCOUNTER — Encounter (HOSPITAL_COMMUNITY): Payer: Self-pay | Admitting: Internal Medicine

## 2021-01-11 DIAGNOSIS — Z8249 Family history of ischemic heart disease and other diseases of the circulatory system: Secondary | ICD-10-CM | POA: Diagnosis not present

## 2021-01-11 DIAGNOSIS — E871 Hypo-osmolality and hyponatremia: Secondary | ICD-10-CM | POA: Diagnosis present

## 2021-01-11 DIAGNOSIS — E8809 Other disorders of plasma-protein metabolism, not elsewhere classified: Secondary | ICD-10-CM | POA: Diagnosis present

## 2021-01-11 DIAGNOSIS — K651 Peritoneal abscess: Secondary | ICD-10-CM | POA: Diagnosis present

## 2021-01-11 DIAGNOSIS — K56609 Unspecified intestinal obstruction, unspecified as to partial versus complete obstruction: Secondary | ICD-10-CM

## 2021-01-11 DIAGNOSIS — K5090 Crohn's disease, unspecified, without complications: Secondary | ICD-10-CM | POA: Diagnosis not present

## 2021-01-11 DIAGNOSIS — I119 Hypertensive heart disease without heart failure: Secondary | ICD-10-CM | POA: Diagnosis present

## 2021-01-11 DIAGNOSIS — K50013 Crohn's disease of small intestine with fistula: Secondary | ICD-10-CM | POA: Diagnosis not present

## 2021-01-11 DIAGNOSIS — Z79891 Long term (current) use of opiate analgesic: Secondary | ICD-10-CM | POA: Diagnosis not present

## 2021-01-11 DIAGNOSIS — R1084 Generalized abdominal pain: Secondary | ICD-10-CM

## 2021-01-11 DIAGNOSIS — K5732 Diverticulitis of large intestine without perforation or abscess without bleeding: Secondary | ICD-10-CM

## 2021-01-11 DIAGNOSIS — K529 Noninfective gastroenteritis and colitis, unspecified: Secondary | ICD-10-CM | POA: Diagnosis present

## 2021-01-11 DIAGNOSIS — R109 Unspecified abdominal pain: Secondary | ICD-10-CM | POA: Diagnosis present

## 2021-01-11 DIAGNOSIS — D75839 Thrombocytosis, unspecified: Secondary | ICD-10-CM | POA: Diagnosis present

## 2021-01-11 DIAGNOSIS — Z886 Allergy status to analgesic agent status: Secondary | ICD-10-CM | POA: Diagnosis not present

## 2021-01-11 DIAGNOSIS — E876 Hypokalemia: Secondary | ICD-10-CM | POA: Diagnosis present

## 2021-01-11 DIAGNOSIS — I10 Essential (primary) hypertension: Secondary | ICD-10-CM

## 2021-01-11 DIAGNOSIS — Z888 Allergy status to other drugs, medicaments and biological substances status: Secondary | ICD-10-CM | POA: Diagnosis not present

## 2021-01-11 DIAGNOSIS — Z20822 Contact with and (suspected) exposure to covid-19: Secondary | ICD-10-CM | POA: Diagnosis present

## 2021-01-11 DIAGNOSIS — D72829 Elevated white blood cell count, unspecified: Secondary | ICD-10-CM | POA: Diagnosis present

## 2021-01-11 DIAGNOSIS — D509 Iron deficiency anemia, unspecified: Secondary | ICD-10-CM | POA: Diagnosis present

## 2021-01-11 DIAGNOSIS — K5000 Crohn's disease of small intestine without complications: Secondary | ICD-10-CM

## 2021-01-11 DIAGNOSIS — K632 Fistula of intestine: Secondary | ICD-10-CM | POA: Diagnosis not present

## 2021-01-11 DIAGNOSIS — Z6829 Body mass index (BMI) 29.0-29.9, adult: Secondary | ICD-10-CM | POA: Diagnosis not present

## 2021-01-11 DIAGNOSIS — Z9049 Acquired absence of other specified parts of digestive tract: Secondary | ICD-10-CM | POA: Diagnosis not present

## 2021-01-11 DIAGNOSIS — J9811 Atelectasis: Secondary | ICD-10-CM | POA: Diagnosis present

## 2021-01-11 DIAGNOSIS — Z79899 Other long term (current) drug therapy: Secondary | ICD-10-CM | POA: Diagnosis not present

## 2021-01-11 DIAGNOSIS — R739 Hyperglycemia, unspecified: Secondary | ICD-10-CM | POA: Diagnosis present

## 2021-01-11 LAB — CBC
HCT: 32.2 % — ABNORMAL LOW (ref 36.0–46.0)
Hemoglobin: 10.2 g/dL — ABNORMAL LOW (ref 12.0–15.0)
MCH: 24.5 pg — ABNORMAL LOW (ref 26.0–34.0)
MCHC: 31.7 g/dL (ref 30.0–36.0)
MCV: 77.2 fL — ABNORMAL LOW (ref 80.0–100.0)
Platelets: 536 10*3/uL — ABNORMAL HIGH (ref 150–400)
RBC: 4.17 MIL/uL (ref 3.87–5.11)
RDW: 15.7 % — ABNORMAL HIGH (ref 11.5–15.5)
WBC: 18 10*3/uL — ABNORMAL HIGH (ref 4.0–10.5)
nRBC: 0 % (ref 0.0–0.2)

## 2021-01-11 LAB — COMPREHENSIVE METABOLIC PANEL
ALT: 26 U/L (ref 0–44)
AST: 14 U/L — ABNORMAL LOW (ref 15–41)
Albumin: 2.8 g/dL — ABNORMAL LOW (ref 3.5–5.0)
Alkaline Phosphatase: 86 U/L (ref 38–126)
Anion gap: 6 (ref 5–15)
BUN: 8 mg/dL (ref 6–20)
CO2: 21 mmol/L — ABNORMAL LOW (ref 22–32)
Calcium: 8.4 mg/dL — ABNORMAL LOW (ref 8.9–10.3)
Chloride: 105 mmol/L (ref 98–111)
Creatinine, Ser: 0.65 mg/dL (ref 0.44–1.00)
GFR, Estimated: >60 mL/min (ref >60)
Glucose, Bld: 112 mg/dL — ABNORMAL HIGH (ref 70–99)
Potassium: 3.6 mmol/L (ref 3.5–5.1)
Sodium: 132 mmol/L — ABNORMAL LOW (ref 135–145)
Total Bilirubin: 1.9 mg/dL — ABNORMAL HIGH (ref 0.3–1.2)
Total Protein: 6.7 g/dL (ref 6.5–8.1)

## 2021-01-11 LAB — IRON AND TIBC
Iron: 10 ug/dL — ABNORMAL LOW (ref 28–170)
Saturation Ratios: 4 % — ABNORMAL LOW (ref 10.4–31.8)
TIBC: 256 ug/dL (ref 250–450)
UIBC: 246 ug/dL

## 2021-01-11 LAB — SEDIMENTATION RATE: Sed Rate: 64 mm/hr — ABNORMAL HIGH (ref 0–22)

## 2021-01-11 LAB — PHOSPHORUS: Phosphorus: 2.9 mg/dL (ref 2.5–4.6)

## 2021-01-11 LAB — RESP PANEL BY RT-PCR (FLU A&B, COVID) ARPGX2
Influenza A by PCR: NEGATIVE
Influenza B by PCR: NEGATIVE
SARS Coronavirus 2 by RT PCR: NEGATIVE

## 2021-01-11 LAB — MAGNESIUM: Magnesium: 1.7 mg/dL (ref 1.7–2.4)

## 2021-01-11 LAB — FERRITIN: Ferritin: 108 ng/mL (ref 11–307)

## 2021-01-11 LAB — VITAMIN B12: Vitamin B-12: 539 pg/mL (ref 180–914)

## 2021-01-11 LAB — HIV ANTIBODY (ROUTINE TESTING W REFLEX): HIV Screen 4th Generation wRfx: NONREACTIVE

## 2021-01-11 LAB — C-REACTIVE PROTEIN: CRP: 33.5 mg/dL — ABNORMAL HIGH (ref <1.0)

## 2021-01-11 MED ORDER — MORPHINE SULFATE (PF) 2 MG/ML IV SOLN: 2.0000 mg | INTRAVENOUS | Status: DC | PRN

## 2021-01-11 MED ORDER — SODIUM CHLORIDE 0.9 % IV SOLN: Freq: Once | INTRAVENOUS | Status: AC

## 2021-01-11 MED ORDER — METRONIDAZOLE 500 MG/100ML IV SOLN
500.0000 mg | Freq: Two times a day (BID) | INTRAVENOUS | Status: DC
  Administered 2021-01-11 – 2021-01-14 (×7): 500 mg via INTRAVENOUS
  Filled 2021-01-11 (×7): qty 100

## 2021-01-11 MED ORDER — LACTATED RINGERS IV SOLN
INTRAVENOUS | Status: DC
Start: 2021-01-11 — End: 2021-01-14

## 2021-01-11 MED ORDER — ACETAMINOPHEN 650 MG RE SUPP: 650.0000 mg | Freq: Four times a day (QID) | RECTAL | Status: DC | PRN

## 2021-01-11 MED ORDER — ONDANSETRON HCL 4 MG PO TABS: 4.0000 mg | ORAL_TABLET | Freq: Four times a day (QID) | ORAL | Status: DC | PRN

## 2021-01-11 MED ORDER — ENOXAPARIN SODIUM 40 MG/0.4ML IJ SOSY
40.0000 mg | PREFILLED_SYRINGE | INTRAMUSCULAR | Status: DC
  Filled 2021-01-11 (×3): qty 0.4

## 2021-01-11 MED ORDER — SODIUM CHLORIDE 0.9 % IV SOLN
1.0000 g | INTRAVENOUS | Status: DC
  Administered 2021-01-11 – 2021-01-14 (×4): 1 g via INTRAVENOUS
  Filled 2021-01-11 (×4): qty 10

## 2021-01-11 MED ORDER — ONDANSETRON HCL 4 MG/2ML IJ SOLN
4.0000 mg | Freq: Four times a day (QID) | INTRAMUSCULAR | Status: DC | PRN
  Administered 2021-01-11: 4 mg via INTRAVENOUS
  Filled 2021-01-11: qty 2

## 2021-01-11 MED ORDER — HYDROMORPHONE HCL 1 MG/ML IJ SOLN
0.5000 mg | INTRAMUSCULAR | Status: DC | PRN
  Administered 2021-01-11: 0.5 mg via INTRAVENOUS
  Filled 2021-01-11: qty 0.5

## 2021-01-11 MED ORDER — SODIUM CHLORIDE 0.9 % IV SOLN
500.0000 mg | INTRAVENOUS | Status: DC
  Administered 2021-01-11: 500 mg via INTRAVENOUS
  Filled 2021-01-11 (×2): qty 500

## 2021-01-11 MED ORDER — ENSURE ENLIVE PO LIQD
237.0000 mL | Freq: Two times a day (BID) | ORAL | Status: DC
  Administered 2021-01-11 – 2021-01-14 (×4): 237 mL via ORAL

## 2021-01-11 MED ORDER — ACETAMINOPHEN 325 MG PO TABS
650.0000 mg | ORAL_TABLET | Freq: Four times a day (QID) | ORAL | Status: DC | PRN
  Administered 2021-01-11 – 2021-01-14 (×6): 650 mg via ORAL
  Filled 2021-01-11 (×6): qty 2

## 2021-01-11 NOTE — H&P (Signed)
History and Physical  Diana Fox DJT:[id number] DOB: 04-07-1970 DOA: 01/10/2021  Referring physician: Maudie Flakes, MD PCP: [facility]  Patient coming from: Home  Chief Complaint: Abdominal pain  HPI: Diana Fox is a 50 y.o. female with medical history significant for hypertension who presents to the emergency department due to abdominal pain which started 2 days ago but worsened yesterday in the morning around 4:30 AM.  Patient states that he initially had abdominal pain which started on Thanksgiving, but this self resolved 3 to 4 days after thanksgiving.  Pain was diffuse,  it was sharp and dull in nature and was rated at 8/10 on pain scale, pain was alleviated with warm blanket, but worsens with movement.  It was intermittent.  Last bowel movement was on 12/2, however, she endorsed some diarrhea with initial abdominal pain. Patient denies fever, chills, nausea, vomiting, diarrhea, chest pain, shortness of breath.  ED Course:  In the emergency department, she was hemodynamically stable.  Work-up in the ED showed leukocytosis, microcytic anemia, thrombocytosis, hyponatremia, hyperglycemia, hypoalbuminemia.  Phosphorus 2.9, magnesium 1.7.  Lipase 24.  Urinalysis was not done due to patient's orange color of urine.  Influenza A, B, SARS coronavirus 2 was negative. CT abdomen and pelvis with contrast showed - Diffusely thickened, roughly 10 cm segment of the distal sigmoid colon with adjacent moderate inflammatory reaction, suspected 2 sites of small bowel fistulization to the diseased segment, and a rim enhancing low-density 2.5 x 1.8 cm structure medial to the mid diseased segment which could represent a necrotic lymph node or contained perforation/abscess. This could be an acute on chronic diverticulitis or could be an inflamed carcinoma. There is mild associated ascites - A low-grade small-bowel obstruction potentially related to the inflammatory process is possible and there  could be additional small bowel enteritis in the right lower abdomen -Umbilical fat hernia - Opacities in the lower lobes which could be atelectasis, pneumonia or combination Patient was treated with IV ciprofloxacin and metronidazole, IV morphine was given, general surgery and gastroenterology were consulted by ED physician and it was recommended for patient to be admitted and managed nonoperatively.  Hospitalist was asked to admit patient for further evaluation and management.  Review of Systems: Constitutional: Negative for chills and fever.  HENT: Negative for ear pain and sore throat.   Eyes: Negative for pain and visual disturbance.  Respiratory: Negative for cough, chest tightness and shortness of breath.   Cardiovascular: Negative for chest pain and palpitations.  Gastrointestinal: Positive for abdominal pain and negative for vomiting.  Endocrine: Negative for polyphagia and polyuria.  Genitourinary: Negative for decreased urine volume, dysuria, enuresis Musculoskeletal: Negative for arthralgias and back pain.  Skin: Negative for color change and rash.  Allergic/Immunologic: Negative for immunocompromised state.  Neurological: Negative for tremors, syncope, speech difficulty, weakness, light-headedness and headaches.  Hematological: Does not bruise/bleed easily.  All other systems reviewed and are negative   Past Medical History:  Diagnosis Date   Anemia    Back pain    Hypertension    Past Surgical History:  Procedure Laterality Date   CHOLECYSTECTOMY      Social History:  reports that she has never smoked. She has never used smokeless tobacco. She reports that she does not drink alcohol and does not use drugs.   Allergies  Allergen Reactions   Motrin [Ibuprofen] Itching   Lisinopril     cough   Sudafed [Pseudoephedrine Hcl] Other (See Comments)    Hypertension    Family  History  Problem Relation Age of Onset   Hypertension Mother    Hypertension Father        Prior to Admission medications   Medication Sig Start Date End Date Taking? Authorizing Provider  Cholecalciferol 50 MCG (2000 UT) TABS Take by mouth.   Yes [provider]  fexofenadine (ALLEGRA) 180 MG tablet Take 1 tablet (180 mg total) by mouth daily. 11/16/20  Yes Jaynee Eagles, PA-C  fluticasone (FLONASE) 50 MCG/ACT nasal spray Place 2 sprays into both nostrils daily. 11/16/20  Yes Jaynee Eagles, PA-C  Multiple Vitamin (MULTIVITAMIN) tablet Take 1 tablet by mouth daily.   Yes [provider]  nebivolol (BYSTOLIC) 10 MG tablet Take 1 tablet (10 mg total) by mouth daily. 08/10/15  Yes Lily Kocher, PA-C  azithromycin (ZITHROMAX) 250 MG tablet Take first 2 tablets together, then 1 every day until finished. Patient not taking: Reported on 01/10/2021 10/10/16   Triplett, Tammy, PA-C  benzocaine (ORAJEL) 10 % mucosal gel Use as directed 1 application in the mouth or throat as needed for mouth pain. Patient not taking: Reported on 01/10/2021 12/22/17   Jacqlyn Larsen, PA-C  loratadine (CLARITIN) 10 MG tablet Take 10 mg by mouth daily. Patient not taking: Reported on 01/10/2021    [provider]  methocarbamol (ROBAXIN) 500 MG tablet Take 1 tablet (500 mg total) by mouth 2 (two) times daily. Patient not taking: Reported on 01/10/2021 11/18/17   Fransico Meadow, PA-C  mupirocin nasal ointment (BACTROBAN) 2 % Apply to affected area TID x 10 days Patient not taking: Reported on 01/10/2021 01/21/16   Kem Parkinson, PA-C  naproxen (NAPROSYN) 500 MG tablet Take 1 tablet (500 mg total) by mouth 2 (two) times daily with a meal. Patient not taking: Reported on 01/10/2021 12/23/19   Triplett, Tammy, PA-C  pseudoephedrine (SUDAFED) 30 MG tablet Take 1 tablet (30 mg total) by mouth every 8 (eight) hours as needed for congestion. Patient not taking: Reported on 01/10/2021 11/16/20   Jaynee Eagles, PA-C  traMADol (ULTRAM) 50 MG tablet Take 1 tablet (50 mg total) by mouth every 6 (six) hours  as needed. Patient not taking: Reported on 01/10/2021 05/09/17   Lily Kocher, PA-C    Physical Exam: BP 104/64 (BP Location: Left Arm)   Pulse 94   Temp 98.1 F (36.7 C) (Oral)   Resp 18   Wt 80.7 kg   LMP 01/10/2021   SpO2 100%   BMI 29.62 kg/m   General: 50 y.o. year-old female well developed well nourished in no acute distress.  Alert and oriented x3. HEENT: NCAT, EOMI Neck: Supple, trachea medial Cardiovascular: Regular rate and rhythm with no rubs or gallops.  No thyromegaly or JVD noted.  No lower extremity edema. 2/4 pulses in all 4 extremities. Respiratory: Clear to auscultation with no wheezes or rales. Good inspiratory effort. Abdomen: Soft, tender to palpation, positive rebound tenderness. Normal bowel sounds x4 quadrants. Muskuloskeletal: No cyanosis, clubbing or edema noted bilaterally Neuro: CN II-XII intact, strength 5/5 x 4, sensation, reflexes intact Skin: No ulcerative lesions noted or rashes Psychiatry: Judgement and insight appear normal. Mood is appropriate for condition and setting          Labs on Admission:  Basic Metabolic Panel: Recent Labs  Lab 01/10/21 1849 01/11/21 0427  NA 133* 132*  K 3.8 3.6  CL 102 105  CO2 22 21*  GLUCOSE 126* 112*  BUN 8 8  CREATININE 0.77 0.65  CALCIUM 8.8* 8.4*  MG  --  1.7  PHOS  --  2.9   Liver Function Tests: Recent Labs  Lab 01/10/21 1849 01/11/21 0427  AST 25 14*  ALT 35 26  ALKPHOS 107 86  BILITOT 1.7* 1.9*  PROT 7.4 6.7  ALBUMIN 3.2* 2.8*   Recent Labs  Lab 01/10/21 1849  LIPASE 24   No results for input(s): AMMONIA in the last 168 hours. CBC: Recent Labs  Lab 01/10/21 1849 01/11/21 0427  WBC 15.5* 18.0*  HGB 11.2* 10.2*  HCT 35.9* 32.2*  MCV 77.0* 77.2*  PLT 588* 536*   Cardiac Enzymes: No results for input(s): CKTOTAL, CKMB, CKMBINDEX, TROPONINI in the last 168 hours.  BNP (last 3 results) No results for input(s): BNP in the last 8760 hours.  ProBNP (last 3 results) No  results for input(s): PROBNP in the last 8760 hours.  CBG: No results for input(s): GLUCAP in the last 168 hours.  Radiological Exams on Admission: CT ABDOMEN PELVIS W CONTRAST  Result Date: 01/10/2021 CLINICAL DATA:  Abdominal pain and pressure. EXAM: CT ABDOMEN AND PELVIS WITH CONTRAST TECHNIQUE: Multidetector CT imaging of the abdomen and pelvis was performed using the standard protocol following bolus administration of intravenous contrast. CONTRAST:  38mL OMNIPAQUE IOHEXOL 300 MG/ML  SOLN COMPARISON:  There is no prior CT for comparison. Limited comparison is available with a pelvic ultrasound 12/13/2018. FINDINGS: Lower chest: There are patchy opacities in the lower lobes which could be due to pneumonia or atelectasis. There is no pleural effusion. There is mild-to-moderate pan chamber cardiomegaly. Hepatobiliary: 21 cm in length slightly steatotic. Gallbladder is absent with postcholecystectomy extrahepatic bile duct prominence with common bile duct 8 mm without visible filling defect and slight intrahepatic biliary prominence. No mass enhancement is seen in the liver. Pancreas: Unremarkable. No pancreatic ductal dilatation or surrounding inflammatory changes. Spleen: Normal in size without focal abnormality. Adrenals/Urinary Tract: There is no adrenal mass, no focal abnormality of the renal cortex and no appreciable calculus or hydronephrosis. The bladder thickness is normal. Stomach/Bowel: The gastric wall and upper and mid abdominal small bowel are unremarkable without contrast. There is dilatation of some of the mid to lower abdominal small bowel up to 3.2 cm and relative decompression in the lower abdominal small bowel with occasional right lower quadrant segments showing mild thickening. A focal transitional segment was not seen. An approximately 10 cm length of the distal sigmoid colon demonstrates moderate circumferential wall thickening and adjacent inflammatory reaction. There is a 1.8 x 2.4  cm rim enhancing structure medial to the diseased segment just anterior to L4-5 right of the midline which could represent a contained perforation or abutting necrotic lymph node. There are additional mildly prominent bilateral common iliac chain nodes common both sides up to 1.1 cm short axis posterior to this process. There is suspected fistula to a small bowel loop at the proximal end of the diseased segment as seen on axial images 45-47 and also suspected on axial images 46-49. Inflammatory changes are also present in the mesentery at the sites of suspected fistulization. There is scattered fluid in the mesenteric folds in the mid to lower abdomen as well noted as well as mild perihepatic and paracolic gutter low-density free fluid, but there is no visible free air in the abdomen or the pelvis. The rest of the colonic wall is unremarkable. The appendix is normal in caliber. The terminal ileum is within normal limits. There is mild fecal stasis in the ascending and transverse colon. There are scattered diverticula along the  left colon. Vascular/Lymphatic: No significant vascular findings. As above there are mildly prominent bilateral common iliac chain nodes and either a necrotic 2.5 x 1.8 cm lymph node or a contained perforation adjacent to the midportion of the diseased sigmoid segment described above. There is no further adenopathy visible. Reproductive: Enlarged fibroid uterus is noted as on the prior ultrasound. Largest fibroid is a calcified leiomyoma in the right body of uterus. The ovaries are follicular but do not appear enlarged. Other: Pelvic phleboliths. Mild mesenteric, upper abdominal and posterior pelvic ascites. No appreciable pneumoperitoneum. Small umbilical fat hernia. Musculoskeletal: No acute or significant osseous findings. 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 There is a diffusely thickened, roughly 10 cm segment of the distal  sigmoid colon with adjacent moderate inflammatory reaction, suspected 2 sites of small bowel fistulization to the diseased segment, and a rim enhancing low-density 2.5 x 1.8 cm structure medial to the mid diseased segment which could represent a necrotic lymph node or contained perforation/abscess. There are bilateral mildly enlarged common iliac chain nodes associated with this. This could be an acute on chronic diverticulitis or could be an inflamed carcinoma. There is mild associated ascites. 2. There are mildly dilated mid to lower abdominal small bowel segments up to 3.2 cm without visible transition and decompressed but occasionally thickened small bowel segments in the right lower abdomen. A low-grade small-bowel obstruction potentially related to the inflammatory process is possible and there could be additional small bowel enteritis in the right lower abdomen. Does the patient have any history of inflammatory bowel disease? 3. Mildly prominent slightly steatotic liver. 4. Post cholecystectomy biliary prominence without a visible biliary ductal filling defect. Laboratory and clinical correlation advised. 5. Enlarged fibroid uterus. 6. Umbilical fat hernia. 7. Cardiomegaly. 8. Opacities in the lower lobes which could be atelectasis, pneumonia or combination. Electronically Signed   By: Telford Nab M.D.   On: 01/10/2021 23:07    EKG: I independently viewed the EKG done and my findings are as followed: EKG was not done in the ED  Assessment/Plan Present on Admission:  Abdominal pain  Principal Problem:   Abdominal pain Active Problems:   Diverticulitis large intestine   Regional enteritis of small bowel (HCC)   SBO (small bowel obstruction) (HCC)   Leukocytosis   Hypoalbuminemia   Essential hypertension  Abdominal pain secondary to multifactorial including possible acute diverticulitis, low-grade small bowel obstruction, enteritis, inflammatory bowel disease Continue IV NS at 100  mLs/Hr Continue IV ceftriaxone and flagyl Continue IV morphine 2 mg q.2h p.r.n. for moderate to severe pain Continue IV Zofran p.r.n. Continue n.p.o at this time with plan to advance diet as tolerated General surgery  will be consulted to follow up with patient in the morning  Leukocytosis possibly secondary to above Continue management as described above  Hypoalbuminemia possibly secondary to mild protein calorie malnutrition Albumin 3.2, continue protein supplement when patient resumes oral intake  Essential hypertension BP meds will be held at this time due to soft BP  Questionable atelectasis vs pneumonia CT abdomen and pelvis was suggestive of pneumonia, patient denies cough, fever, chills, shortness of breath Patient was already started on IV antibiotics due to presumed intra-abdominal infection Patient does not show any pulmonary symptoms clinically Continue to monitor and treat accordingly  DVT prophylaxis: Lovenox  Code Status: Full code  Family Communication: Husband at bedside (all questions answered to satisfaction)  Disposition Plan:  Patient is from:  home Anticipated DC to:                   SNF or family members home Anticipated DC date:               2-3 days Anticipated DC barriers:          Patient requires inpatient management due to diverticulitis requiring IV antibiotics  Consults called: General surgery  Admission status: Inpatient    Bernadette Hoit MD [facility]  01/11/2021, 8:14 AM

## 2021-01-11 NOTE — ED Provider Notes (Signed)
  Provider Note MRN:  [id number]  Arrival date & time: 01/11/21    ED Course and Medical Decision Making  Assumed care from Dr. Roderic Palau at shift change.  Abdominal pain, constipation, awaiting CT imaging.  CT revealing very inflamed 10 cm colon with signs of fistulization, signs of either necrotic lymph node or contained perforation.  Patient continues to have normal vital signs, no fever.  Has received antibiotics, fluids.  Case discussed with both general surgery and gastroenterology, per Dr. Arnoldo Morale of general surgery patient can likely be managed nonoperatively, will admit to medicine.  .Critical Care Performed by: Maudie Flakes, MD Authorized by: Maudie Flakes, MD   Critical care provider statement:    Critical care time (minutes):  33   Critical care was necessary to treat or prevent imminent or life-threatening deterioration of the following conditions: Intra-abdominal abscess/perforation.   Critical care was time spent personally by me on the following activities:  Development of treatment plan with patient or surrogate, discussions with consultants, evaluation of patient's response to treatment, examination of patient, ordering and review of laboratory studies, ordering and review of radiographic studies, ordering and performing treatments and interventions, pulse oximetry, re-evaluation of patient's condition and review of old charts   I assumed direction of critical care for this patient from another provider in my specialty: yes     Care discussed with: admitting provider    Final Clinical Impressions(s) / ED Diagnoses     ICD-10-CM   1. Colitis  K52.9       ED Discharge Orders     None       Discharge Instructions   None     Barth Kirks. [place]onia Small, [place] [email or web address]    Maudie Flakes, MD 01/11/21 [phone]

## 2021-01-11 NOTE — Consult Note (Signed)
Reason for Consult: Partial small bowel obstruction, colitis/enteritis Referring Physician: Dr. Hyman Diana Fox is an 50 y.o. female.  HPI: Patient is a 50 year old black female who presented to the emergency room with a 2-day history of worsening lower abdominal pain.  She had a previous episode of abdominal pain which started on Thanksgiving but resolved on its own.  The pain recurred and is primarily in the lower half of the abdomen.  Her last bowel movement was several days ago.  She has been passing some flatus.  She states [place] was at the beach.  She was admitted in the hospital for further evaluation and treatment.  CT scan shows multiple abnormalities that are listed below.  This morning, she states she does feel better, though she is still bloated.  Past Medical History:  Diagnosis Date   Anemia    Back pain    Hypertension     Past Surgical History:  Procedure Laterality Date   CHOLECYSTECTOMY      Family History  Problem Relation Age of Onset   Hypertension Mother    Hypertension Father    Colon cancer Neg Hx    Colon polyps Neg Hx     Social History:  reports that she has never smoked. She has never used smokeless tobacco. She reports that she does not drink alcohol and does not use drugs.  Allergies:  Allergies  Allergen Reactions   Motrin [Ibuprofen] Itching   Lisinopril     cough   Sudafed [Pseudoephedrine Hcl] Other (See Comments)    Hypertension    Medications: I have reviewed the patient's current medications. Prior to Admission:  Medications Prior to Admission  Medication Sig Dispense Refill Last Dose   Cholecalciferol 50 MCG (2000 UT) TABS Take by mouth.   Past Week   fexofenadine (ALLEGRA) 180 MG tablet Take 1 tablet (180 mg total) by mouth daily. 30 tablet 0 unknown   fluticasone (FLONASE) 50 MCG/ACT nasal spray Place 2 sprays into both nostrils daily. 16 g 12 unknown   Multiple Vitamin (MULTIVITAMIN) tablet Take 1 tablet by mouth daily.   Past  Week   nebivolol (BYSTOLIC) 10 MG tablet Take 1 tablet (10 mg total) by mouth daily. 30 tablet 1 01/10/2021   azithromycin (ZITHROMAX) 250 MG tablet Take first 2 tablets together, then 1 every day until finished. (Patient not taking: Reported on 01/10/2021) 6 tablet 0 Not Taking   benzocaine (ORAJEL) 10 % mucosal gel Use as directed 1 application in the mouth or throat as needed for mouth pain. (Patient not taking: Reported on 01/10/2021) 5.3 g 0 Not Taking   loratadine (CLARITIN) 10 MG tablet Take 10 mg by mouth daily. (Patient not taking: Reported on 01/10/2021)   Not Taking   methocarbamol (ROBAXIN) 500 MG tablet Take 1 tablet (500 mg total) by mouth 2 (two) times daily. (Patient not taking: Reported on 01/10/2021) 20 tablet 0 Not Taking   mupirocin nasal ointment (BACTROBAN) 2 % Apply to affected area TID x 10 days (Patient not taking: Reported on 01/10/2021) 15 g 0 Not Taking   naproxen (NAPROSYN) 500 MG tablet Take 1 tablet (500 mg total) by mouth 2 (two) times daily with a meal. (Patient not taking: Reported on 01/10/2021) 20 tablet 0 Not Taking   pseudoephedrine (SUDAFED) 30 MG tablet Take 1 tablet (30 mg total) by mouth every 8 (eight) hours as needed for congestion. (Patient not taking: Reported on 01/10/2021) 30 tablet 0 Not Taking   traMADol (ULTRAM) 50  MG tablet Take 1 tablet (50 mg total) by mouth every 6 (six) hours as needed. (Patient not taking: Reported on 01/10/2021) 10 tablet 0 Not Taking    Results for orders placed or performed during the hospital encounter of 01/10/21 (from the past 48 hour(s))  Urinalysis, Routine w reflex microscopic     Status: Abnormal   Collection Time: 01/10/21  6:29 PM  Result Value Ref Range   Color, Urine ORANGE (A) YELLOW    Comment: BIOCHEMICALS MAY BE AFFECTED BY COLOR   APPearance CLEAR CLEAR   Specific Gravity, Urine  1.005 - 1.030    TEST NOT REPORTED DUE TO COLOR INTERFERENCE OF URINE PIGMENT   pH  5.0 - 8.0    TEST NOT REPORTED DUE TO COLOR  INTERFERENCE OF URINE PIGMENT   Glucose, UA (A) NEGATIVE mg/dL    TEST NOT REPORTED DUE TO COLOR INTERFERENCE OF URINE PIGMENT   Hgb urine dipstick (A) NEGATIVE    TEST NOT REPORTED DUE TO COLOR INTERFERENCE OF URINE PIGMENT   Bilirubin Urine (A) NEGATIVE    TEST NOT REPORTED DUE TO COLOR INTERFERENCE OF URINE PIGMENT   Ketones, ur (A) NEGATIVE mg/dL    TEST NOT REPORTED DUE TO COLOR INTERFERENCE OF URINE PIGMENT   Protein, ur (A) NEGATIVE mg/dL    TEST NOT REPORTED DUE TO COLOR INTERFERENCE OF URINE PIGMENT   Nitrite (A) NEGATIVE    TEST NOT REPORTED DUE TO COLOR INTERFERENCE OF URINE PIGMENT   Leukocytes,Ua (A) NEGATIVE    TEST NOT REPORTED DUE TO COLOR INTERFERENCE OF URINE PIGMENT    Comment: Performed at [facility], [address] [place], [place] [address]  Urinalysis, Microscopic (reflex)     Status: Abnormal   Collection Time: 01/10/21  6:29 PM  Result Value Ref Range   RBC / HPF 0-5 0 - 5 RBC/hpf   WBC, UA 0-5 0 - 5 WBC/hpf   Bacteria, UA MANY (A) NONE SEEN   Squamous Epithelial / LPF 6-10 0 - 5   Mucus PRESENT    Amorphous Crystal PRESENT     Comment: Performed at [facility], [address] [place], [place] [address]  Lipase, blood     Status: None   Collection Time: 01/10/21  6:49 PM  Result Value Ref Range   Lipase 24 11 - 51 U/L    Comment: Performed at [facility], [address] [place], [place] [address]  Comprehensive metabolic panel     Status: Abnormal   Collection Time: 01/10/21  6:49 PM  Result Value Ref Range   Sodium 133 (L) 135 - 145 mmol/L   Potassium 3.8 3.5 - 5.1 mmol/L   Chloride 102 98 - 111 mmol/L   CO2 22 22 - 32 mmol/L   Glucose, Bld 126 (H) 70 - 99 mg/dL    Comment: Glucose reference range applies only to samples taken after fasting for at least 8 hours.   BUN 8 6 - 20 mg/dL   Creatinine, Ser 0.77 0.44 - 1.00 mg/dL   Calcium 8.8 (L) 8.9 - 10.3 mg/dL   Total Protein 7.4 6.5 - 8.1 g/dL   Albumin 3.2 (L) 3.5 - 5.0 g/dL   AST 25 15 - 41  U/L   ALT 35 0 - 44 U/L   Alkaline Phosphatase 107 38 - 126 U/L   Total Bilirubin 1.7 (H) 0.3 - 1.2 mg/dL   GFR, Estimated >60 >60 mL/min    Comment: (NOTE) Calculated using the CKD-EPI Creatinine  Equation (2021)    Anion gap 9 5 - 15    Comment: Performed at [facility], [address] [place], [place] [address]  CBC     Status: Abnormal   Collection Time: 01/10/21  6:49 PM  Result Value Ref Range   WBC 15.5 (H) 4.0 - 10.5 K/uL   RBC 4.66 3.87 - 5.11 MIL/uL   Hemoglobin 11.2 (L) 12.0 - 15.0 g/dL   HCT 35.9 (L) 36.0 - 46.0 %   MCV 77.0 (L) 80.0 - 100.0 fL   MCH 24.0 (L) 26.0 - 34.0 pg   MCHC 31.2 30.0 - 36.0 g/dL   RDW 15.6 (H) 11.5 - 15.5 %   Platelets 588 (H) 150 - 400 K/uL   nRBC 0.0 0.0 - 0.2 %    Comment: Performed at [facility], [address] [place], [place] [address]  Resp Panel by RT-PCR (Flu A&B, Covid) Nasopharyngeal Swab     Status: None   Collection Time: 01/10/21 11:55 PM   Specimen: Nasopharyngeal Swab; Nasopharyngeal(NP) swabs in vial transport medium  Result Value Ref Range   SARS Coronavirus 2 by RT PCR NEGATIVE NEGATIVE    Comment: (NOTE) SARS-CoV-2 target nucleic acids are NOT DETECTED.  The SARS-CoV-2 RNA is generally detectable in upper respiratory specimens during the acute phase of infection. The lowest concentration of SARS-CoV-2 viral copies this assay can detect is 138 copies/mL. A negative result does not preclude SARS-Cov-2 infection and should not be used as the sole basis for treatment or other patient management decisions. A negative result may occur with  improper specimen collection/handling, submission of specimen other than nasopharyngeal swab, presence of viral mutation(s) within the areas targeted by this assay, and inadequate number of viral copies(<138 copies/mL). A negative result must be combined with clinical observations, patient history, and epidemiological information. The expected result is Negative.  Fact Sheet for  Patients:  EntrepreneurPulse.com.au  Fact Sheet for Healthcare Providers:  IncredibleEmployment.be  This test is no t yet approved or cleared by the [place] FDA and  has been authorized for detection and/or diagnosis of SARS-CoV-2 by FDA under an Emergency Use Authorization (EUA). This EUA will remain  in effect (meaning this test can be used) for the duration of the COVID-19 declaration under Section 564(b)(1) of the Act, 21 U.S.C.section 360bbb-3(b)(1), unless the authorization is terminated  or revoked sooner.       Influenza A by PCR NEGATIVE NEGATIVE   Influenza B by PCR NEGATIVE NEGATIVE    Comment: (NOTE) The Xpert Xpress SARS-CoV-2/FLU/RSV plus assay is intended as an aid in the diagnosis of influenza from Nasopharyngeal swab specimens and should not be used as a sole basis for treatment. Nasal washings and aspirates are unacceptable for Xpert Xpress SARS-CoV-2/FLU/RSV testing.  Fact Sheet for Patients: EntrepreneurPulse.com.au  Fact Sheet for Healthcare Providers: IncredibleEmployment.be  This test is not yet approved or cleared by the [place] FDA and has been authorized for detection and/or diagnosis of SARS-CoV-2 by FDA under an Emergency Use Authorization (EUA). This EUA will remain in effect (meaning this test can be used) for the duration of the COVID-19 declaration under Section 564(b)(1) of the Act, 21 U.S.C. section 360bbb-3(b)(1), unless the authorization is terminated or revoked.  Performed at [facility], [address], [place], [place] [address]   HIV Antibody (routine testing w rflx)     Status: None   Collection Time: 01/11/21  4:27 AM  Result Value Ref Range   HIV Screen 4th Generation  wRfx Non Reactive Non Reactive    Comment: Performed at [facility], [place] [address] [place], [place] [address]  Comprehensive metabolic panel     Status: Abnormal    Collection Time: 01/11/21  4:27 AM  Result Value Ref Range   Sodium 132 (L) 135 - 145 mmol/L   Potassium 3.6 3.5 - 5.1 mmol/L   Chloride 105 98 - 111 mmol/L   CO2 21 (L) 22 - 32 mmol/L   Glucose, Bld 112 (H) 70 - 99 mg/dL    Comment: Glucose reference range applies only to samples taken after fasting for at least 8 hours.   BUN 8 6 - 20 mg/dL   Creatinine, Ser 0.65 0.44 - 1.00 mg/dL   Calcium 8.4 (L) 8.9 - 10.3 mg/dL   Total Protein 6.7 6.5 - 8.1 g/dL   Albumin 2.8 (L) 3.5 - 5.0 g/dL   AST 14 (L) 15 - 41 U/L   ALT 26 0 - 44 U/L   Alkaline Phosphatase 86 38 - 126 U/L   Total Bilirubin 1.9 (H) 0.3 - 1.2 mg/dL   GFR, Estimated >60 >60 mL/min    Comment: (NOTE) Calculated using the CKD-EPI Creatinine Equation (2021)    Anion gap 6 5 - 15    Comment: Performed at [facility], [address] [place], [place] [address]  CBC     Status: Abnormal   Collection Time: 01/11/21  4:27 AM  Result Value Ref Range   WBC 18.0 (H) 4.0 - 10.5 K/uL   RBC 4.17 3.87 - 5.11 MIL/uL   Hemoglobin 10.2 (L) 12.0 - 15.0 g/dL   HCT 32.2 (L) 36.0 - 46.0 %   MCV 77.2 (L) 80.0 - 100.0 fL   MCH 24.5 (L) 26.0 - 34.0 pg   MCHC 31.7 30.0 - 36.0 g/dL   RDW 15.7 (H) 11.5 - 15.5 %   Platelets 536 (H) 150 - 400 K/uL   nRBC 0.0 0.0 - 0.2 %    Comment: Performed at [facility], [address] [place], [place] [address]  Magnesium     Status: None   Collection Time: 01/11/21  4:27 AM  Result Value Ref Range   Magnesium 1.7 1.7 - 2.4 mg/dL    Comment: Performed at [facility], [address] [place], [place] [address]  Phosphorus     Status: None   Collection Time: 01/11/21  4:27 AM  Result Value Ref Range   Phosphorus 2.9 2.5 - 4.6 mg/dL    Comment: Performed at [facility], [address], [place], [place] [address]    CT ABDOMEN PELVIS W CONTRAST  Result Date: 01/10/2021 CLINICAL DATA:  Abdominal pain and pressure. EXAM: CT ABDOMEN AND PELVIS WITH CONTRAST TECHNIQUE: Multidetector CT imaging of the  abdomen and pelvis was performed using the standard protocol following bolus administration of intravenous contrast. CONTRAST:  36mL OMNIPAQUE IOHEXOL 300 MG/ML  SOLN COMPARISON:  There is no prior CT for comparison. Limited comparison is available with a pelvic ultrasound 12/13/2018. FINDINGS: Lower chest: There are patchy opacities in the lower lobes which could be due to pneumonia or atelectasis. There is no pleural effusion. There is mild-to-moderate pan chamber cardiomegaly. Hepatobiliary: 21 cm in length slightly steatotic. Gallbladder is absent with postcholecystectomy extrahepatic bile duct prominence with common bile duct 8 mm without visible filling defect and slight intrahepatic biliary prominence. No mass enhancement is seen in the liver. Pancreas: Unremarkable. No pancreatic ductal dilatation or surrounding inflammatory changes. Spleen: Normal in  size without focal abnormality. Adrenals/Urinary Tract: There is no adrenal mass, no focal abnormality of the renal cortex and no appreciable calculus or hydronephrosis. The bladder thickness is normal. Stomach/Bowel: The gastric wall and upper and mid abdominal small bowel are unremarkable without contrast. There is dilatation of some of the mid to lower abdominal small bowel up to 3.2 cm and relative decompression in the lower abdominal small bowel with occasional right lower quadrant segments showing mild thickening. A focal transitional segment was not seen. An approximately 10 cm length of the distal sigmoid colon demonstrates moderate circumferential wall thickening and adjacent inflammatory reaction. There is a 1.8 x 2.4 cm rim enhancing structure medial to the diseased segment just anterior to L4-5 right of the midline which could represent a contained perforation or abutting necrotic lymph node. There are additional mildly prominent bilateral common iliac chain nodes common both sides up to 1.1 cm short axis posterior to this process. There is  suspected fistula to a small bowel loop at the proximal end of the diseased segment as seen on axial images 45-47 and also suspected on axial images 46-49. Inflammatory changes are also present in the mesentery at the sites of suspected fistulization. There is scattered fluid in the mesenteric folds in the mid to lower abdomen as well noted as well as mild perihepatic and paracolic gutter low-density free fluid, but there is no visible free air in the abdomen or the pelvis. The rest of the colonic wall is unremarkable. The appendix is normal in caliber. The terminal ileum is within normal limits. There is mild fecal stasis in the ascending and transverse colon. There are scattered diverticula along the left colon. Vascular/Lymphatic: No significant vascular findings. As above there are mildly prominent bilateral common iliac chain nodes and either a necrotic 2.5 x 1.8 cm lymph node or a contained perforation adjacent to the midportion of the diseased sigmoid segment described above. There is no further adenopathy visible. Reproductive: Enlarged fibroid uterus is noted as on the prior ultrasound. Largest fibroid is a calcified leiomyoma in the right body of uterus. The ovaries are follicular but do not appear enlarged. Other: Pelvic phleboliths. Mild mesenteric, upper abdominal and posterior pelvic ascites. No appreciable pneumoperitoneum. Small umbilical fat hernia. Musculoskeletal: No acute or significant osseous findings. 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 There are bilateral mildly enlarged  common iliac chain nodes associated with this. This could be an acute on chronic diverticulitis or could be an inflamed carcinoma. There is mild associated ascites. 2. There are mildly dilated mid to lower abdominal small bowel segments up to 3.2 cm without visible transition and decompressed but occasionally thickened small bowel segments in the right lower abdomen. A low-grade small-bowel obstruction potentially related to the inflammatory process is possible and there could be additional small bowel enteritis in the right lower abdomen. Does the patient have any history of inflammatory bowel disease? 3. Mildly prominent slightly steatotic liver. 4. Post cholecystectomy biliary prominence without a visible biliary ductal filling defect. Laboratory and clinical correlation advised. 5. Enlarged fibroid uterus. 6. Umbilical fat hernia. 7. Cardiomegaly. 8. Opacities in the lower lobes which  could be atelectasis, pneumonia or combination. Electronically Signed   By: Telford Nab M.D.   On: 01/10/2021 23:07    ROS:  Pertinent items are noted in HPI.  Blood pressure 104/64, pulse 94, temperature 98.1 F (36.7 C), temperature source Oral, resp. rate 18, weight 80.7 kg, last menstrual period 01/10/2021, SpO2 100 %. Physical Exam: Pleasant black female sitting in a chair no acute distress Head is normocephalic, atraumatic Lungs clear to auscultation with breath sounds bilaterally Heart examination reveals regular rate and rhythm without S3, S4, murmurs Abdomen is distended but soft with some discomfort to palpation in the suprapubic region.  No rigidity is noted.  CT scan images personally reviewed  Assessment/Plan: Impression: Multiple complex findings including enteritis, colitis, fistulization and a 2-1/2 cm extraluminal structure which may be either a contained perforation or necrotic lymph node.  GI has seen the patient.  At this point, there is no acute surgical intervention needed.  Patient does  need a work-up for inflammatory bowel disease, malignancy, colitis/enteritis.  We will follow with you.  Aviva Signs 01/11/2021, 11:10 AM

## 2021-01-11 NOTE — Consult Note (Addendum)
Referring Provider: [facility] Primary Care Physician:  [facility] Primary Gastroenterologist:  Dr.Carver   Date of Admission: 01/10/2021 Date of Consultation: 01/11/21  Reason for Consultation:  Colitis  HPI:  Diana Fox is a 50 y.o. year old female presenting with abdominal pain and CT findings of colitis with diffusely thickened 10 cm segment of distal sigmoid and suspected small bowel fistulization, low density structure medial to mid diseased segment suspicious for necrotic lymph node or contained perforation/abscess. Differentials including acute on chronic diverticulitis but unable to exclude underlying carcinoma. Also noted mildly dilated mid to lower abdominal small bowel segments without obvious transition point, unable to exclude low-grade SBO in light of inflammatory process or small bowel enteritis. Leukocytosis with WBC count 15.5 on admission, now 18 this morning. Afebrile. She has been started on Rocephin and metronidazole. General Surgery has been consulted as well.   Patient interviewed with husband at bedside. Had diarrhea over Thanksgiving for 2 days, then resolved. No associated abdominal pain at that time.  Started having pain Sunday morning around 0430am. No pain prior to that. No rectal bleeding. Pain was diffuse across abdomen. Pain 8 out of 10. Heating pad helped. No N/V prior to admission. Burping. No flatus today. Pain is improved today. No prior history of abdominal pain, changes in bowel habits. No prior colonoscopy. No family history of colorectal cancer or polyps.    Drank ginger ale before I presented to the room and vomited thereafter.   Past Medical History:  Diagnosis Date   Anemia    Back pain    Hypertension     Past Surgical History:  Procedure Laterality Date   CHOLECYSTECTOMY      Prior to Admission medications   Medication Sig Start Date End Date Taking? Authorizing Provider  Cholecalciferol 50 MCG (2000 UT) TABS Take  by mouth.   Yes [provider]  fexofenadine (ALLEGRA) 180 MG tablet Take 1 tablet (180 mg total) by mouth daily. 11/16/20  Yes Jaynee Eagles, PA-C  fluticasone (FLONASE) 50 MCG/ACT nasal spray Place 2 sprays into both nostrils daily. 11/16/20  Yes Jaynee Eagles, PA-C  Multiple Vitamin (MULTIVITAMIN) tablet Take 1 tablet by mouth daily.   Yes [provider]  nebivolol (BYSTOLIC) 10 MG tablet Take 1 tablet (10 mg total) by mouth daily. 08/10/15  Yes Lily Kocher, PA-C  azithromycin (ZITHROMAX) 250 MG tablet Take first 2 tablets together, then 1 every day until finished. Patient not taking: Reported on 01/10/2021 10/10/16   Triplett, Tammy, PA-C  benzocaine (ORAJEL) 10 % mucosal gel Use as directed 1 application in the mouth or throat as needed for mouth pain. Patient not taking: Reported on 01/10/2021 12/22/17   Jacqlyn Larsen, PA-C  loratadine (CLARITIN) 10 MG tablet Take 10 mg by mouth daily. Patient not taking: Reported on 01/10/2021    [provider]  methocarbamol (ROBAXIN) 500 MG tablet Take 1 tablet (500 mg total) by mouth 2 (two) times daily. Patient not taking: Reported on 01/10/2021 11/18/17   Fransico Meadow, PA-C  mupirocin nasal ointment (BACTROBAN) 2 % Apply to affected area TID x 10 days Patient not taking: Reported on 01/10/2021 01/21/16   Kem Parkinson, PA-C  naproxen (NAPROSYN) 500 MG tablet Take 1 tablet (500 mg total) by mouth 2 (two) times daily with a meal. Patient not taking: Reported on 01/10/2021 12/23/19   Kem Parkinson, PA-C  pseudoephedrine (SUDAFED) 30 MG tablet Take 1 tablet (30 mg total) by mouth every 8 (  eight) hours as needed for congestion. Patient not taking: Reported on 01/10/2021 11/16/20   Jaynee Eagles, PA-C  traMADol (ULTRAM) 50 MG tablet Take 1 tablet (50 mg total) by mouth every 6 (six) hours as needed. Patient not taking: Reported on 01/10/2021 05/09/17   Lily Kocher, PA-C    Current Facility-Administered Medications  Medication  Dose Route Frequency Provider Last Rate Last Admin   acetaminophen (TYLENOL) tablet 650 mg  650 mg Oral Q6H PRN Lavina Hamman, MD   650 mg at 01/11/21 1610   Or   acetaminophen (TYLENOL) suppository 650 mg  650 mg Rectal Q6H PRN Lavina Hamman, MD       azithromycin [facility]) 500 mg in sodium chloride 0.9 % 250 mL IVPB  500 mg Intravenous Q24H Lavina Hamman, MD       cefTRIAXone (ROCEPHIN) 1 g in sodium chloride 0.9 % 100 mL IVPB  1 g Intravenous Q24H Lavina Hamman, MD 200 mL/hr at 01/11/21 0852 1 g at 01/11/21 0852   enoxaparin (LOVENOX) injection 40 mg  40 mg Subcutaneous Q24H Adefeso, Oladapo, DO       feeding supplement (ENSURE ENLIVE / ENSURE PLUS) liquid 237 mL  237 mL Oral BID BM Adefeso, Oladapo, DO   237 mL at 01/11/21 9604   lactated ringers infusion   Intravenous Continuous Lavina Hamman, MD 100 mL/hr at 01/11/21 1041 New Bag at 01/11/21 1041   metroNIDAZOLE (FLAGYL) IVPB 500 mg  500 mg Intravenous Q12H Lavina Hamman, MD 100 mL/hr at 01/11/21 1043 500 mg at 01/11/21 1043   morphine 2 MG/ML injection 2 mg  2 mg Intravenous Q2H PRN Lavina Hamman, MD       ondansetron [facility]) tablet 4 mg  4 mg Oral Q6H PRN Lavina Hamman, MD       Or   ondansetron [facility]) injection 4 mg  4 mg Intravenous Q6H PRN Lavina Hamman, MD   4 mg at 01/11/21 0900    Allergies as of 01/10/2021 - Review Complete 01/10/2021  Allergen Reaction Noted   Motrin [ibuprofen] Itching 10/14/2014   Lisinopril  09/02/2013   Sudafed [pseudoephedrine hcl] Other (See Comments) 02/04/2015    Family History  Problem Relation Age of Onset   Hypertension Mother    Hypertension Father    Colon cancer Neg Hx    Colon polyps Neg Hx     Social History   Socioeconomic History   Marital status: Married    Spouse name: Not on file   Number of children: Not on file   Years of education: Not on file   Highest education level: Not on file  Occupational History   Not on file  Tobacco Use   Smoking  status: Never   Smokeless tobacco: Never  Vaping Use   Vaping Use: Never used  Substance and Sexual Activity   Alcohol use: No   Drug use: No   Sexual activity: Not on file  Other Topics Concern   Not on file  Social History Narrative   Not on file   Social Determinants of Health   Financial Resource Strain: Not on file  Food Insecurity: Not on file  Transportation Needs: Not on file  Physical Activity: Not on file  Stress: Not on file  Social Connections: Not on file  Intimate Partner Violence: Not on file    Review of Systems: Gen: Denies fever, chills, loss of appetite, change in weight or weight loss CV: Denies chest  pain, heart palpitations, syncope, edema  Resp: Denies shortness of breath with rest, cough, wheezing GI: see HPI GU : Denies urinary burning, urinary frequency, urinary incontinence.  MS: Denies joint pain,swelling, cramping Derm: Denies rash, itching, dry skin Psych: Denies depression, anxiety,confusion, or memory loss Heme: Denies bruising, bleeding, and enlarged lymph nodes.  Physical Exam: Vital signs in last 24 hours: Temp:  [98.1 F (36.7 C)-99.8 F (37.7 C)] 98.1 F (36.7 C) (12/05 0609) Pulse Rate:  [92-105] 94 (12/05 0609) Resp:  [18-28] 18 (12/05 0609) BP: (104-140)/(64-88) 104/64 (12/05 0609) SpO2:  [95 %-100 %] 100 % (12/05 0609) FiO2 (%):  [21 %] 21 % (12/05 0151) Weight:  [80.7 kg] 80.7 kg (12/04 1755) Last BM Date: 01/08/21 General:   Alert,  Well-developed, well-nourished, pleasant and cooperative in NAD Head:  Normocephalic and atraumatic. Eyes:  Sclera clear, no icterus.   Conjunctiva pink. Lungs:  Clear throughout to auscultation.   No wheezes, crackles, or rhonchi. No acute distress. Heart:  S1 S2 present; no murmurs, clicks, rubs,  or gallops. Abdomen:  hypoactive bowel sounds. Distended but soft. TTP RLQ, epigastric, and LLQ.  Rectal:  Deferred until time of colonoscopy.   Msk:  Symmetrical without gross deformities.  Normal posture. Extremities:  Without  edema. Neurologic:  Alert and  oriented x4 Psych:  Alert and cooperative. Normal mood and affect.  Intake/Output from previous day: 12/04 0701 - 12/05 0700 In: 1300 [IV Piggyback:1300] Out: -  Intake/Output this shift: Total I/O In: 960 [P.O.:960] Out: -   Lab Results: Recent Labs    01/10/21 1849 01/11/21 0427  WBC 15.5* 18.0*  HGB 11.2* 10.2*  HCT 35.9* 32.2*  PLT 588* 536*   BMET Recent Labs    01/10/21 1849 01/11/21 0427  NA 133* 132*  K 3.8 3.6  CL 102 105  CO2 22 21*  GLUCOSE 126* 112*  BUN 8 8  CREATININE 0.77 0.65  CALCIUM 8.8* 8.4*   LFT Recent Labs    01/10/21 1849 01/11/21 0427  PROT 7.4 6.7  ALBUMIN 3.2* 2.8*  AST 25 14*  ALT 35 26  ALKPHOS 107 86  BILITOT 1.7* 1.9*     Studies/Results: CT ABDOMEN PELVIS W CONTRAST  Result Date: 01/10/2021 CLINICAL DATA:  Abdominal pain and pressure. EXAM: CT ABDOMEN AND PELVIS WITH CONTRAST TECHNIQUE: Multidetector CT imaging of the abdomen and pelvis was performed using the standard protocol following bolus administration of intravenous contrast. CONTRAST:  85mL OMNIPAQUE IOHEXOL 300 MG/ML  SOLN COMPARISON:  There is no prior CT for comparison. Limited comparison is available with a pelvic ultrasound 12/13/2018. FINDINGS: Lower chest: There are patchy opacities in the lower lobes which could be due to pneumonia or atelectasis. There is no pleural effusion. There is mild-to-moderate pan chamber cardiomegaly. Hepatobiliary: 21 cm in length slightly steatotic. Gallbladder is absent with postcholecystectomy extrahepatic bile duct prominence with common bile duct 8 mm without visible filling defect and slight intrahepatic biliary prominence. No mass enhancement is seen in the liver. Pancreas: Unremarkable. No pancreatic ductal dilatation or surrounding inflammatory changes. Spleen: Normal in size without focal abnormality. Adrenals/Urinary Tract: There is no adrenal mass, no focal  abnormality of the renal cortex and no appreciable calculus or hydronephrosis. The bladder thickness is normal. Stomach/Bowel: The gastric wall and upper and mid abdominal small bowel are unremarkable without contrast. There is dilatation of some of the mid to lower abdominal small bowel up to 3.2 cm and relative decompression in the lower abdominal small bowel with  occasional right lower quadrant segments showing mild thickening. A focal transitional segment was not seen. An approximately 10 cm length of the distal sigmoid colon demonstrates moderate circumferential wall thickening and adjacent inflammatory reaction. There is a 1.8 x 2.4 cm rim enhancing structure medial to the diseased segment just anterior to L4-5 right of the midline which could represent a contained perforation or abutting necrotic lymph node. There are additional mildly prominent bilateral common iliac chain nodes common both sides up to 1.1 cm short axis posterior to this process. There is suspected fistula to a small bowel loop at the proximal end of the diseased segment as seen on axial images 45-47 and also suspected on axial images 46-49. Inflammatory changes are also present in the mesentery at the sites of suspected fistulization. There is scattered fluid in the mesenteric folds in the mid to lower abdomen as well noted as well as mild perihepatic and paracolic gutter low-density free fluid, but there is no visible free air in the abdomen or the pelvis. The rest of the colonic wall is unremarkable. The appendix is normal in caliber. The terminal ileum is within normal limits. There is mild fecal stasis in the ascending and transverse colon. There are scattered diverticula along the left colon. Vascular/Lymphatic: No significant vascular findings. As above there are mildly prominent bilateral common iliac chain nodes and either a necrotic 2.5 x 1.8 cm lymph node or a contained perforation adjacent to the midportion of the diseased  sigmoid segment described above. There is no further adenopathy visible. Reproductive: Enlarged fibroid uterus is noted as on the prior ultrasound. Largest fibroid is a calcified leiomyoma in the right body of uterus. The ovaries are follicular but do not appear enlarged. Other: Pelvic phleboliths. Mild mesenteric, upper abdominal and posterior pelvic ascites. No appreciable pneumoperitoneum. Small umbilical fat hernia. Musculoskeletal: No acute or significant osseous findings. 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 There are bilateral mildly enlarged common iliac chain nodes associated with this. This could be an acute on chronic diverticulitis or could be an inflamed carcinoma. There is mild associated ascites. 2. There are mildly dilated mid to lower abdominal small bowel segments up to 3.2 cm without visible transition and decompressed but occasionally thickened small bowel segments in the right lower abdomen. A low-grade small-bowel obstruction potentially related to the inflammatory process is possible and there could be additional small bowel enteritis in the right lower abdomen. Does the patient have any history of inflammatory bowel disease? 3. Mildly prominent slightly steatotic liver. 4. Post cholecystectomy biliary prominence without a visible biliary ductal filling defect. Laboratory and clinical correlation advised. 5. Enlarged fibroid uterus. 6. Umbilical fat hernia. 7. Cardiomegaly. 8. Opacities in the lower lobes which could be atelectasis, pneumonia or combination. Electronically  Signed   By: Telford Nab M.D.   On: 01/10/2021 23:07    Impression: 50 y.o. year old female presenting with abdominal pain and CT findings of colitis with diffusely thickened 10 cm segment of distal sigmoid and suspected small bowel fistulization, low density structure medial to mid diseased segment suspicious for necrotic lymph node or contained perforation/abscess, mildly dilated mid  to lower abdominal small bowel segments without obvious transition point, unable to exclude low-grade SBO in light of inflammatory process or small bowel enteritis. She was also found to have leukocytosis with WBC count 15.5 on admission, now 18 this morning. Afebrile.  General Surgery has been consulted as well.   Colitis: interestingly, she had several day history of diarrhea at Thanksgiving, but this resolved on own. Recent bout with abdominal pain but no diarrhea. Recommend stool studies if recurrent diarrhea. Concern for low-grade SBO on imaging; without flatus today and one episode of vomiting after drinking ginger ale quickly. Continue to monitor for worsening signs. Agree with empiric antibiotics and Surgery consult. Differentials including diverticular, malignancy, unable to exclude IBD. However, she has not had any chronic symptoms to suspect underlying IBD. She will need a colonoscopy once over acute illness. No prior colonoscopy.  Appreciate Surgery input.   Microcytic anemia: chronic. Iron studies have been ordered. Consider EGD at time of colonoscopy as outpatient.   Mildly elevated bilirubin: non-specific. Will fractionate.   Plan: Agree with IV antibiotics, blood cultures Supportive care Clear liquids Appreciate surgical input Colonoscopy as outpatient  Follow-up on pending iron studies Stool studies if diarrhea Will continue to follow with you  Annitta Needs, PhD, ANP-BC [facility]     LOS: 0 days    01/11/2021, 11:01 AM

## 2021-01-11 NOTE — Progress Notes (Signed)
[place] OF CARE NOTE Patient: Diana Fox NBV:[id number]   PCP: [facility] DOB: 1971/01/11   DOA: 01/10/2021   DOS: 01/11/2021    Patient was admitted by my colleague earlier on 01/11/2021. I have reviewed the H&P as well as assessment and plan and agree with the same. Important changes in the plan are listed below.  Plan of care: Principal Problem:   Abdominal pain Active Problems:   Diverticulitis large intestine   Regional enteritis of small bowel (HCC)   SBO (small bowel obstruction) (HCC)   Leukocytosis   Hypoalbuminemia   Essential hypertension   Atelectasis   Presents with sudden onset of abdominal pain.  CT abdomen shows evidence of inflamed distal sigmoid colon,  small bowel fistulization,  possible necrotic lymph node or a contained perforation.   Small bowel enteritis  Low-grade small bowel obstruction.  Patient denies any prior history of frequent diarrhea, GI bleed, frequent recurrent abdominal pain.  No fever no chills reported as well. No family history of IBD. At present patient is on clear liquid diet. IV ceftriaxone and IV Flagyl ordered.  Continue with IV fluids.  IV pain medication.  Supportive care. General surgery and GI both consulted.  Appreciate assistance.  microcytic anemia. Will check further work-up.  Leukocytosis. In the setting of enteritis, possible pneumonia. Continue with IV ceftriaxone and Flagyl, add azithromycin.   Author: Berle Mull, MD [facility] 01/11/2021 9:18 AM   If 7PM-7AM, please contact night-coverage at [email or web address]

## 2021-01-12 DIAGNOSIS — K529 Noninfective gastroenteritis and colitis, unspecified: Secondary | ICD-10-CM

## 2021-01-12 DIAGNOSIS — D509 Iron deficiency anemia, unspecified: Secondary | ICD-10-CM | POA: Diagnosis present

## 2021-01-12 DIAGNOSIS — K651 Peritoneal abscess: Secondary | ICD-10-CM | POA: Diagnosis present

## 2021-01-12 DIAGNOSIS — K632 Fistula of intestine: Secondary | ICD-10-CM | POA: Diagnosis present

## 2021-01-12 DIAGNOSIS — E871 Hypo-osmolality and hyponatremia: Secondary | ICD-10-CM

## 2021-01-12 LAB — COMPREHENSIVE METABOLIC PANEL
ALT: 16 U/L (ref 0–44)
AST: 9 U/L — ABNORMAL LOW (ref 15–41)
Albumin: 2.5 g/dL — ABNORMAL LOW (ref 3.5–5.0)
Alkaline Phosphatase: 78 U/L (ref 38–126)
Anion gap: 7 (ref 5–15)
BUN: 10 mg/dL (ref 6–20)
CO2: 22 mmol/L (ref 22–32)
Calcium: 8.4 mg/dL — ABNORMAL LOW (ref 8.9–10.3)
Chloride: 107 mmol/L (ref 98–111)
Creatinine, Ser: 0.72 mg/dL (ref 0.44–1.00)
GFR, Estimated: >60 mL/min (ref >60)
Glucose, Bld: 106 mg/dL — ABNORMAL HIGH (ref 70–99)
Potassium: 3.6 mmol/L (ref 3.5–5.1)
Sodium: 136 mmol/L (ref 135–145)
Total Bilirubin: 0.5 mg/dL (ref 0.3–1.2)
Total Protein: 6.1 g/dL — ABNORMAL LOW (ref 6.5–8.1)

## 2021-01-12 LAB — CBC WITH DIFFERENTIAL/PLATELET
Abs Immature Granulocytes: 0.25 10*3/uL — ABNORMAL HIGH (ref 0.00–0.07)
Basophils Absolute: 0 10*3/uL (ref 0.0–0.1)
Basophils Relative: 0 %
Eosinophils Absolute: 0 10*3/uL (ref 0.0–0.5)
Eosinophils Relative: 0 %
HCT: 28.2 % — ABNORMAL LOW (ref 36.0–46.0)
Hemoglobin: 9.1 g/dL — ABNORMAL LOW (ref 12.0–15.0)
Immature Granulocytes: 1 %
Lymphocytes Relative: 6 %
Lymphs Abs: 1.2 10*3/uL (ref 0.7–4.0)
MCH: 25.1 pg — ABNORMAL LOW (ref 26.0–34.0)
MCHC: 32.3 g/dL (ref 30.0–36.0)
MCV: 77.7 fL — ABNORMAL LOW (ref 80.0–100.0)
Monocytes Absolute: 0.6 10*3/uL (ref 0.1–1.0)
Monocytes Relative: 3 %
Neutro Abs: 16.5 10*3/uL — ABNORMAL HIGH (ref 1.7–7.7)
Neutrophils Relative %: 90 %
Platelets: 466 10*3/uL — ABNORMAL HIGH (ref 150–400)
RBC: 3.63 MIL/uL — ABNORMAL LOW (ref 3.87–5.11)
RDW: 15.8 % — ABNORMAL HIGH (ref 11.5–15.5)
WBC: 18.6 10*3/uL — ABNORMAL HIGH (ref 4.0–10.5)
nRBC: 0 % (ref 0.0–0.2)

## 2021-01-12 LAB — C DIFFICILE QUICK SCREEN W PCR REFLEX
C Diff antigen: NEGATIVE
C Diff interpretation: NOT DETECTED
C Diff toxin: NEGATIVE

## 2021-01-12 NOTE — Progress Notes (Signed)
[facility] Progress Note  Patient: Diana Fox    BHA:[id number]  DOA: 01/10/2021    Date of Service: the patient was seen and examined on 01/12/2021  Brief hospital course: No notes on file  Assessment and Plan: * Colitis Presents with complaints of diarrhea on Thanksgiving followed by abdominal pain on the presentation.  Due to severe abdominal pain a CT abdomen pelvis was performed which shows evidence of diffuse colitis, small bowel enteritis, small bowel fistula and a possible necrotic lymph node or intra-abdominal abscess/microperforation. While the patient denies any prior history of ongoing diarrhea this appears more likely in trend with inflammatory bowel disease. GI consulted and further work-up initiated for now. CRP 37. Due to ongoing leukocytosis continue antibiotics with improvement in symptoms. Diet management per GI, currently on clear liquid diet.   Intra-abdominal abscess ([place]) See colitis  Small bowel fistula See colitis  SBO (small bowel obstruction) ([place]) Seen on admission on CT scan likely secondary to severe inflammation. After IV antibiotics patient started to have bowel movements without any nausea vomiting and improvement in abdominal pain suspecting that small bowel obstruction has resolved. Will get x-ray abdomen tomorrow.  Hyponatremia From poor p.o. intake.  Monitor.  Leukocytosis Most likely secondary to widespread inflammation and possible infection. C. difficile negative.  GI pathogen panel currently pending.  Iron deficiency anemia Iron level 10.  Hemoglobin at baseline around 11, currently around 9 less likely secondary to dilution. Not a good candidate for IV iron therapy due to concern with infection. Initiate p.o. iron once SBO completely resolves.  Hypoalbuminemia Likely in the setting of inflammatory bowel disease. Monitor.  Essential hypertension Blood pressure relatively stable for now. On nebivolol at home. Currently  holding.  Atelectasis Seen on the chest x-ray. Patient was given azithromycin along with ceftriaxone and Flagyl with concern for possible pneumonia. Developed reaction to azithromycin. Currently holding that medication, low suspicion for pneumonia.    Body mass index is 29.68 kg/m.        Subjective: Feeling better.  Reported 3 loose BM this morning.  No blood in the stool.  Nausea resolved.  No abdominal pain as well.  Breathing okay.  Objective: Vital signs were reviewed and unremarkable.  Exam: General: Appear in mild distress, no Rash; Oral Mucosa Clear, moist. no Abnormal Neck Mass Or lumps, Conjunctiva normal  Cardiovascular: S1 and S2 Present, no Murmur, Respiratory: Normal respiratory effort, Bilateral Air entry present and faint basal crackles, no wheezes Abdomen: Bowel Sound present, Soft and no tenderness Extremities: trace Pedal edema Neurology: alert and oriented to time, place, and person affect appropriate. no new focal deficit Gait not checked due to patient safety concerns     Data Reviewed: Hemoglobin dropping down from 10-9 likely dilutional.  WBC still elevated.  Disposition:  Status is: Inpatient  Remains inpatient appropriate because: Requiring IV antibiotics, further wound care for colitis as well as intra-abdominal abscess and further work-up for IBD   Family Communication: Family at bedside.  DVT Prophylaxis: enoxaparin (LOVENOX) injection 40 mg Start: 01/11/21 1000 SCDs Start: 01/11/21 0151   Time spent: 35 minutes.   Author: Berle Mull  01/12/2021 8:18 PM  To reach On-call, see care teams to locate the attending and reach out via [email or web address]. Between 7PM-7AM, please contact night-coverage If you still have difficulty reaching the attending provider, please page the [facility] (Director on Call) for [facility] on amion for assistance.

## 2021-01-12 NOTE — Assessment & Plan Note (Signed)
See colitis 

## 2021-01-12 NOTE — Assessment & Plan Note (Addendum)
Diagnosed via CT with associated diarrhea and abdominal pain. General surgery consulted for possible perforation/abscess and GI consulted for sigmoid colitis. General surgery with no recommendations for management at this time and have signed off. GI with concern for possible IBD vs infection. Patient started empirically on antibiotics. Symptoms improving. GI recommending outpatient follow-up. Patient transitioned from Ceftriaxone/Flagyl to Augmentin on discharge.

## 2021-01-12 NOTE — Assessment & Plan Note (Addendum)
Diagnosed via CT. Patient without significant emesis. Clinically, not consistent with SBO. General surgery consulted with no recommendations.

## 2021-01-12 NOTE — Assessment & Plan Note (Addendum)
Most likely secondary to widespread inflammation and/or infection. C. Difficile negative. GI pathogen panel Negative. Leukocytosis improving prior to discharge.

## 2021-01-12 NOTE — Progress Notes (Signed)
Patient states yesterday while azithromycin was infusing her left arm began to swell , her face got red, and she broke out, there are bumps around where the IV site was infusing. Notified Dr. Posey Pronto

## 2021-01-12 NOTE — Progress Notes (Signed)
Subjective: Overall feeling  much better.  Had an episode of vomiting this morning.  Not sure why.  States her mouth was dry.  It occurred after a bowel movement.  She had a total of 3 watery green bowel movement this morning.  Denies BRBPR or melena.  No nausea currently.  She has been drinking liquids today and tolerating them well.  Reports her abdomen is less distended today, but not quite back to normal. No abdominal pain.   Denies menorrhagia.  Objective: Vital signs in last 24 hours: Temp:  [97.8 F (36.6 C)-98.9 F (37.2 C)] 98.6 F (37 C) (12/06 1244) Pulse Rate:  [92-98] 93 (12/06 1244) Resp:  [18-20] 18 (12/06 1244) BP: (107-149)/(73-91) 132/76 (12/06 1244) SpO2:  [97 %-100 %] 100 % (12/06 1244) Last BM Date: 01/08/21 General:   Alert and oriented, pleasant Head:  Normocephalic and atraumatic. Eyes:  No icterus, sclera clear. Conjuctiva pink.  Abdomen:  Bowel sounds present.  Distended and mildly tense without tenderness to palpation. No rebound or guarding. No masses appreciated  Msk:  Symmetrical without gross deformities. Normal posture. Extremities:  Without edema. Neurologic:  Alert and  oriented x4;  grossly normal neurologically. Psych:  Normal mood and affect.  Intake/Output from previous day: 12/05 0701 - 12/06 0700 In: 2332.1 [P.O.:960; I.V.:1172; IV Piggyback:200.1] Out: -  Intake/Output this shift: No intake/output data recorded.  Lab Results: Recent Labs    01/10/21 1849 01/11/21 0427 01/12/21 0531  WBC 15.5* 18.0* 18.6*  HGB 11.2* 10.2* 9.1*  HCT 35.9* 32.2* 28.2*  PLT 588* 536* 466*   BMET Recent Labs    01/10/21 1849 01/11/21 0427 01/12/21 0531  NA 133* 132* 136  K 3.8 3.6 3.6  CL 102 105 107  CO2 22 21* 22  GLUCOSE 126* 112* 106*  BUN 8 8 10   CREATININE 0.77 0.65 0.72  CALCIUM 8.8* 8.4* 8.4*   LFT Recent Labs    01/10/21 1849 01/11/21 0427 01/12/21 0531  PROT 7.4 6.7 6.1*  ALBUMIN 3.2* 2.8* 2.5*  AST 25 14* 9*  ALT  35 26 16  ALKPHOS 107 86 78  BILITOT 1.7* 1.9* 0.5   Studies/Results: CT ABDOMEN PELVIS W CONTRAST  Result Date: 01/10/2021 CLINICAL DATA:  Abdominal pain and pressure. EXAM: CT ABDOMEN AND PELVIS WITH CONTRAST TECHNIQUE: Multidetector CT imaging of the abdomen and pelvis was performed using the standard protocol following bolus administration of intravenous contrast. CONTRAST:  39mL OMNIPAQUE IOHEXOL 300 MG/ML  SOLN COMPARISON:  There is no prior CT for comparison. Limited comparison is available with a pelvic ultrasound 12/13/2018. FINDINGS: Lower chest: There are patchy opacities in the lower lobes which could be due to pneumonia or atelectasis. There is no pleural effusion. There is mild-to-moderate pan chamber cardiomegaly. Hepatobiliary: 21 cm in length slightly steatotic. Gallbladder is absent with postcholecystectomy extrahepatic bile duct prominence with common bile duct 8 mm without visible filling defect and slight intrahepatic biliary prominence. No mass enhancement is seen in the liver. Pancreas: Unremarkable. No pancreatic ductal dilatation or surrounding inflammatory changes. Spleen: Normal in size without focal abnormality. Adrenals/Urinary Tract: There is no adrenal mass, no focal abnormality of the renal cortex and no appreciable calculus or hydronephrosis. The bladder thickness is normal. Stomach/Bowel: The gastric wall and upper and mid abdominal small bowel are unremarkable without contrast. There is dilatation of some of the mid to lower abdominal small bowel up to 3.2 cm and relative decompression in the lower abdominal small bowel with occasional  right lower quadrant segments showing mild thickening. A focal transitional segment was not seen. An approximately 10 cm length of the distal sigmoid colon demonstrates moderate circumferential wall thickening and adjacent inflammatory reaction. There is a 1.8 x 2.4 cm rim enhancing structure medial to the diseased segment just anterior to  L4-5 right of the midline which could represent a contained perforation or abutting necrotic lymph node. There are additional mildly prominent bilateral common iliac chain nodes common both sides up to 1.1 cm short axis posterior to this process. There is suspected fistula to a small bowel loop at the proximal end of the diseased segment as seen on axial images 45-47 and also suspected on axial images 46-49. Inflammatory changes are also present in the mesentery at the sites of suspected fistulization. There is scattered fluid in the mesenteric folds in the mid to lower abdomen as well noted as well as mild perihepatic and paracolic gutter low-density free fluid, but there is no visible free air in the abdomen or the pelvis. The rest of the colonic wall is unremarkable. The appendix is normal in caliber. The terminal ileum is within normal limits. There is mild fecal stasis in the ascending and transverse colon. There are scattered diverticula along the left colon. Vascular/Lymphatic: No significant vascular findings. As above there are mildly prominent bilateral common iliac chain nodes and either a necrotic 2.5 x 1.8 cm lymph node or a contained perforation adjacent to the midportion of the diseased sigmoid segment described above. There is no further adenopathy visible. Reproductive: Enlarged fibroid uterus is noted as on the prior ultrasound. Largest fibroid is a calcified leiomyoma in the right body of uterus. The ovaries are follicular but do not appear enlarged. Other: Pelvic phleboliths. Mild mesenteric, upper abdominal and posterior pelvic ascites. No appreciable pneumoperitoneum. Small umbilical fat hernia. Musculoskeletal: No acute or significant osseous findings. 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 There is a diffusely thickened, roughly 10 cm segment of the distal sigmoid colon with adjacent moderate inflammatory reaction, suspected 2 sites of  small bowel fistulization to the diseased segment, and a rim enhancing low-density 2.5 x 1.8 cm structure medial to the mid diseased segment which could represent a necrotic lymph node or contained perforation/abscess. There are bilateral mildly enlarged common iliac chain nodes associated with this. This could be an acute on chronic diverticulitis or could be an inflamed carcinoma. There is mild associated ascites. 2. There are mildly dilated mid to lower abdominal small bowel segments up to 3.2 cm without visible transition and decompressed but occasionally thickened small bowel segments in the right lower abdomen. A low-grade small-bowel obstruction potentially related to the inflammatory process is possible and there could be additional small bowel enteritis in the right lower abdomen. Does the patient have any history of inflammatory bowel disease? 3. Mildly prominent slightly steatotic liver. 4. Post cholecystectomy biliary prominence without a visible biliary ductal filling defect. Laboratory and clinical correlation advised. 5. Enlarged fibroid uterus. 6. Umbilical fat hernia. 7. Cardiomegaly. 8. Opacities in the lower lobes which could be atelectasis, pneumonia or combination. Electronically Signed   By: Telford Nab M.D.   On: 01/10/2021 23:07    Assessment: 50 y.o. 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 She was also found to have leukocytosis with WBC count 15.5 on admission, afebrile. GI and general surgery consulted.   Colitis: Interestingly, patient had several day  history of diarrhea at Thanksgiving, but this resolved on its own.  She then developed abdominal pain without diarrhea; however, she did have 3 watery bowel movements this morning.  Stool studies ordered, but have not yet been completed.  Differentials include diverticular, malignancy, unable to exclude IBD.  Notably, she has not had any chronic symptoms to suggest underlying IBD.  Clinically, patient is feeling much better, her abdominal pain has resolved and she is now moving her bowels and passing gas.  Denies BRBPR or melena.  Her white blood cell count has increased to 18.6 from 18.0 yesterday and 15.5 on admission.  CRP and sed rate are elevated at 33.5. She has been on empiric antibiotics this admission.  We will continue her antibiotics for now and await stool studies.  There is some concern that she may have an abscess.  Unclear if this would need to be drained if white blood cell count continues to rise.  Notably, general surgery has seen patient and does not recommend any intervention at this time.  Ultimately, she is going to need a colonoscopy once over her acute illness.  Microcytic anemia: Chronic.  Denies overt GI bleeding or menorrhagia.  Hemoglobin 11.2 on admission, down to 9.1 today.  Ferritin 108, iron 10 (L), saturation 4% (L).  B12 539.  Ferritin may be falsely normal in light of significant inflammation/infection.  Needs colonoscopy +/- EGD outpatient.   Mildly elevated bilirubin: Mildly elevated bilirubin on admission, nonspecific.  Now resolved.  LFTs remain normal.  Plan: Complete stool studies. Agree with IV antibiotics. Clear liquid diet. Continue supportive care. Colonoscopy +/- EGD as outpatient. Needs iron supplementation at discharge.     LOS: 1 day    01/12/2021, 1:06 PM   Aliene Altes, [facility]

## 2021-01-12 NOTE — Assessment & Plan Note (Addendum)
Unsure of etiology but patient is not post-menopausal. Iron of 10 with normal TIBC and ferritin; iron saturation is low. Patient started on iron supplementation. GI on board and will likely perform colonoscopy at some point as an outpatient. No evidence of acute bleeding.

## 2021-01-12 NOTE — Assessment & Plan Note (Addendum)
Noted on chest imaging. Patient started on azithromycin which was discontinued secondary to a hives reaction. Low suspicion for pneumonia, so no atypical coverage restarted.

## 2021-01-12 NOTE — Progress Notes (Signed)
Agree with GI consultation.  Will follow peripherally.

## 2021-01-12 NOTE — Assessment & Plan Note (Addendum)
Mild. Resolved. 

## 2021-01-12 NOTE — Assessment & Plan Note (Addendum)
Patient is on nebivolol as an outpatient which was held on admission. Continue home regimen.

## 2021-01-12 NOTE — Assessment & Plan Note (Addendum)
In the setting of possible inflammatory bowel disease. Dietitian recommending protein supplementation.

## 2021-01-13 DIAGNOSIS — E876 Hypokalemia: Secondary | ICD-10-CM

## 2021-01-13 DIAGNOSIS — K651 Peritoneal abscess: Secondary | ICD-10-CM

## 2021-01-13 DIAGNOSIS — D509 Iron deficiency anemia, unspecified: Secondary | ICD-10-CM

## 2021-01-13 DIAGNOSIS — K632 Fistula of intestine: Secondary | ICD-10-CM

## 2021-01-13 LAB — COMPREHENSIVE METABOLIC PANEL
ALT: 16 U/L (ref 0–44)
AST: 7 U/L — ABNORMAL LOW (ref 15–41)
Albumin: 2.7 g/dL — ABNORMAL LOW (ref 3.5–5.0)
Alkaline Phosphatase: 76 U/L (ref 38–126)
Anion gap: 10 (ref 5–15)
BUN: 14 mg/dL (ref 6–20)
CO2: 22 mmol/L (ref 22–32)
Calcium: 8.5 mg/dL — ABNORMAL LOW (ref 8.9–10.3)
Chloride: 104 mmol/L (ref 98–111)
Creatinine, Ser: 0.7 mg/dL (ref 0.44–1.00)
GFR, Estimated: >60 mL/min (ref >60)
Glucose, Bld: 104 mg/dL — ABNORMAL HIGH (ref 70–99)
Potassium: 3.1 mmol/L — ABNORMAL LOW (ref 3.5–5.1)
Sodium: 136 mmol/L (ref 135–145)
Total Bilirubin: 0.4 mg/dL (ref 0.3–1.2)
Total Protein: 7 g/dL (ref 6.5–8.1)

## 2021-01-13 LAB — CBC WITH DIFFERENTIAL/PLATELET
Abs Immature Granulocytes: 0.39 10*3/uL — ABNORMAL HIGH (ref 0.00–0.07)
Basophils Absolute: 0 10*3/uL (ref 0.0–0.1)
Basophils Relative: 0 %
Eosinophils Absolute: 0 10*3/uL (ref 0.0–0.5)
Eosinophils Relative: 0 %
HCT: 29 % — ABNORMAL LOW (ref 36.0–46.0)
Hemoglobin: 9.5 g/dL — ABNORMAL LOW (ref 12.0–15.0)
Immature Granulocytes: 2 %
Lymphocytes Relative: 8 %
Lymphs Abs: 1.6 10*3/uL (ref 0.7–4.0)
MCH: 24.8 pg — ABNORMAL LOW (ref 26.0–34.0)
MCHC: 32.8 g/dL (ref 30.0–36.0)
MCV: 75.7 fL — ABNORMAL LOW (ref 80.0–100.0)
Monocytes Absolute: 0.8 10*3/uL (ref 0.1–1.0)
Monocytes Relative: 4 %
Neutro Abs: 17 10*3/uL — ABNORMAL HIGH (ref 1.7–7.7)
Neutrophils Relative %: 86 %
Platelets: 500 10*3/uL — ABNORMAL HIGH (ref 150–400)
RBC: 3.83 MIL/uL — ABNORMAL LOW (ref 3.87–5.11)
RDW: 15.6 % — ABNORMAL HIGH (ref 11.5–15.5)
WBC: 19.8 10*3/uL — ABNORMAL HIGH (ref 4.0–10.5)
nRBC: 0 % (ref 0.0–0.2)

## 2021-01-13 LAB — GASTROINTESTINAL PANEL BY PCR, STOOL (REPLACES STOOL CULTURE)

## 2021-01-13 LAB — PROCALCITONIN: Procalcitonin: 1.3 ng/mL

## 2021-01-13 LAB — HEPATITIS B SURFACE ANTIGEN: Hepatitis B Surface Ag: NONREACTIVE

## 2021-01-13 MED ORDER — NEBIVOLOL HCL 10 MG PO TABS
10.0000 mg | ORAL_TABLET | Freq: Every day | ORAL | Status: DC
  Administered 2021-01-14: 10 mg via ORAL
  Filled 2021-01-13 (×2): qty 1

## 2021-01-13 MED ORDER — POTASSIUM CHLORIDE CRYS ER 20 MEQ PO TBCR
40.0000 meq | EXTENDED_RELEASE_TABLET | Freq: Once | ORAL | Status: AC
  Administered 2021-01-13: 40 meq via ORAL
  Filled 2021-01-13: qty 2

## 2021-01-13 NOTE — Progress Notes (Signed)
Will sign off.  Call us if needed.

## 2021-01-13 NOTE — Assessment & Plan Note (Signed)
Supplementation

## 2021-01-13 NOTE — Progress Notes (Signed)
Subjective: Patient reports she is doing well this morning, sitting in bed eating applesauce. She denies any abdominal pain, nausea or vomiting, had 2 BMs this morning, both formed, one smaller and one larger, denies blood in stools or melena. She is having some back pain from hx of lumbar strain, but otherwise doing very well overall.   Objective: Vital signs in last 24 hours: Temp:  [98.1 F (36.7 C)-98.6 F (37 C)] 98.3 F (36.8 C) (12/07 0616) Pulse Rate:  [92-93] 92 (12/07 0616) Resp:  [18-20] 19 (12/07 0616) BP: (132-141)/(74-79) 141/74 (12/07 0616) SpO2:  [99 %-100 %] 100 % (12/07 0616) Weight:  [80.9 kg] 80.9 kg (12/06 1244) Last BM Date: 01/13/21 General:   Alert and oriented, pleasant Head:  Normocephalic and atraumatic. Eyes:  No icterus, sclera clear. Conjuctiva pink.  Mouth:  Without lesions, mucosa pink and moist.  Heart:  S1, S2 present, no murmurs noted.  Lungs: Clear to auscultation bilaterally, without wheezing, rales, or rhonchi.  Abdomen:  Bowel sounds present, full but soft, non-tender, non-distended. No HSM or hernias noted. No rebound or guarding. No masses appreciated  Msk:  Symmetrical without gross deformities. Normal posture. Pulses:  Normal pulses noted. Extremities:  Without clubbing or edema. Neurologic:  Alert and  oriented x4;  grossly normal neurologically. Skin:  Warm and dry, intact without significant lesions.  Psych:  Alert and cooperative. Normal mood and affect.  Intake/Output from previous day: 12/06 0701 - 12/07 0700 In: 1055.8 [P.O.:360; I.V.:385.2; IV Piggyback:310.6] Out: 7 [Urine:3; Stool:4] Intake/Output this shift: No intake/output data recorded.  Lab Results: Recent Labs    01/11/21 0427 01/12/21 0531 01/13/21 0600  WBC 18.0* 18.6* 19.8*  HGB 10.2* 9.1* 9.5*  HCT 32.2* 28.2* 29.0*  PLT 536* 466* 500*   BMET Recent Labs    01/11/21 0427 01/12/21 0531 01/13/21 0600  NA 132* 136 136  K 3.6 3.6 3.1*  CL 105 107 104   CO2 21* 22 22  GLUCOSE 112* 106* 104*  BUN 8 10 14   CREATININE 0.65 0.72 0.70  CALCIUM 8.4* 8.4* 8.5*   LFT Recent Labs    01/11/21 0427 01/12/21 0531 01/13/21 0600  PROT 6.7 6.1* 7.0  ALBUMIN 2.8* 2.5* 2.7*  AST 14* 9* 7*  ALT 26 16 16   ALKPHOS 86 78 76  BILITOT 1.9* 0.5 0.4   Assessment: 50 year old female presenting with abdominal pain and CT findings of colitis with diffusely thickened 10cm segment of distal sigmoid colon and 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 GI/general surgery consulted.  Colitis: stool studies ordered due to previous reports of diarrhea prior to admission, that recurred yesterday x3, watery in nature.C diff negative, GI path still pending. She has not had any chronic symptoms that suggest IBD, she has had no BRBPR or melena, however, her current symptoms along with imaging and laboratory abnormalities are certainly concerning for possible Crohn's disease. WBC increased to 19.8 today from 15.5 on admission, CRP and sed rate elevated, 33.5 And 64 respectively. She continues on empiric antibiotics since admission. General surgery has evaluated patient and does not recommend surgical intervention at this time for suspected abscess, she will need outpatient colonoscopy after acute illness resolves as IBD nor malignancy cannot be ruled out.  Procalcitonin  is WNL today, TPMT in process. Reassuringly her symptoms have improved quite a bit, she is without abdominal pain or diarrhea today.   Microcytic anemia: chronic, she is without overt GI bleeding or menorrhagia. Hgb 11.2 on admission, down to 9.1 yesterday, 9.5 this morning. Ferritin 108, Iron 10, saturation 4%, B12  539. Will need colonoscopy +EGD outpatient for further evaluation.   Plan: Follow for stool study results Continue empiric antibiotics, total course 2 weeks consider advancing given improvement in GI symptoms and no plan for surgical intervention at this time Continue supportive care Trend H&H, monitor for overt GI bleeding Outpatient EGD and colonoscopy 6-8 weeks after acute illness   LOS: 2 days    01/13/2021, 8:43 AM   Dietrick Barris L. Alver Sorrow, MSN, APRN, AGNP-C Adult-Gerontology Nurse Practitioner [facility]

## 2021-01-13 NOTE — Progress Notes (Signed)
PROGRESS NOTE    Diana Fox  EXH:[id number] DOB: Dec 07, 1970 DOA: 01/10/2021 PCP: [facility], [facility]   Brief Narrative: Diana Fox is a 50 y.o. female with a history of hypertension. Patient presented secondary to abdominal pain and diarrhea and was found to have evidence of sigmoid colitis in addition to enteritis with evidence of small bowel fistulization, concerning for IBD vs infection. CT imaging was also concerning for possible perforation and abscess. General surgery consulted with recommendation for no continued management. GI consulted for colitis. Patient managed on empiric antibiotics.   Assessment & Plan:   * Colitis Diagnosed via CT with associated diarrhea and abdominal pain. General surgery consulted for possible perforation/abscess and GI consulted for sigmoid colitis. General surgery with no recommendations for management at this time and have signed off. GI with concern for possible IBD vs infection. Patient started empirically on antibiotics. Symptoms improving. -Continue Ceftriaxone/Flagyl  Intra-abdominal abscess ([place]) See colitis  Small bowel fistula See colitis  Iron deficiency anemia Unsure of etiology but patient is not post-menopausal. Iron of 10 with normal TIBC and ferritin; iron saturation is low. Patient started on iron supplementation. GI on board and will likely perform colonoscopy at some point as an outpatient. No evidence of acute bleeding.  Atelectasis Noted on chest imaging. Patient started on azithromycin which was discontinued secondary to a hives reaction. Low suspicion for pneumonia, so no atypical coverage restarted.   Essential hypertension Patient is on nebivolol as an outpatient which was held on admission. -Resume home nebivolol  Hypoalbuminemia In the setting of possible inflammatory bowel disease. -Dietitian consult  Leukocytosis Most likely secondary to widespread inflammation and/or infection. C. Difficile negative.  GI pathogen panel is still pending.   Hyponatremia-resolved as of 01/13/2021 Mild. Resolved.  SBO (small bowel obstruction) (HCC)-resolved as of 01/13/2021 Diagnosed via CT. Patient without significant emesis. Clinically, not consistent with SBO. General surgery consulted with no recommendations.      DVT prophylaxis: Lovenox Code Status:   Code Status: Full Code Family Communication: None at bedside Disposition Plan: Discharge home possible in 24-48 hours pending improvement of leukocytosis   Consultants:  General surgery Gastroenterology  Procedures:  None  Antimicrobials: Ceftriaxone Azithromycin (hives reaction)    Subjective: Abdominal pain is improved. Bowel movements have become more normal.  Objective: Vitals:   01/12/21 0639 01/12/21 1244 01/12/21 2310 01/13/21 0616  BP: (!) 149/91 132/76 138/79 (!) 141/74  Pulse: 92 93 92 92  Resp: 20 18 20 19   Temp: 98.9 F (37.2 C) 98.6 F (37 C) 98.1 F (36.7 C) 98.3 F (36.8 C)  TempSrc: Oral Oral Oral Oral  SpO2: 97% 100% 99% 100%  Weight:  80.9 kg    Height:  5\' 5"  (1.651 m)      Intake/Output Summary (Last 24 hours) at 01/13/2021 1339 Last data filed at 01/13/2021 0900 Gross per 24 hour  Intake 1175.75 ml  Output 8 ml  Net 1167.75 ml   Filed Weights   01/10/21 1755 01/12/21 1244  Weight: 80.7 kg 80.9 kg    Examination:  General exam: Appears calm and comfortable Respiratory system: Clear to auscultation. Respiratory effort normal. Cardiovascular system: S1 & S2 heard, RRR. No murmurs, rubs, gallops or clicks. Gastrointestinal system: Abdomen is distended, soft and nontender. No organomegaly or masses felt. Normal bowel sounds heard. Central nervous system: Alert and oriented. No focal neurological deficits. Musculoskeletal: No edema. No calf tenderness Skin: No cyanosis. No rashes Psychiatry: Judgement and insight appear normal. Mood & affect appropriate.  Data Reviewed: I have personally  reviewed following labs and imaging studies  CBC Lab Results  Component Value Date   WBC 19.8 (H) 01/13/2021   RBC 3.83 (L) 01/13/2021   HGB 9.5 (L) 01/13/2021   HCT 29.0 (L) 01/13/2021   MCV 75.7 (L) 01/13/2021   MCH 24.8 (L) 01/13/2021   PLT 500 (H) 01/13/2021   MCHC 32.8 01/13/2021   RDW 15.6 (H) 01/13/2021   LYMPHSABS 1.6 01/13/2021   MONOABS 0.8 01/13/2021   EOSABS 0.0 01/13/2021   BASOSABS 0.0 02/54/2706     Last metabolic panel Lab Results  Component Value Date   NA 136 01/13/2021   K 3.1 (L) 01/13/2021   CL 104 01/13/2021   CO2 22 01/13/2021   BUN 14 01/13/2021   CREATININE 0.70 01/13/2021   GLUCOSE 104 (H) 01/13/2021   GFRNONAA >60 01/13/2021   GFRAA >60 10/26/2015   CALCIUM 8.5 (L) 01/13/2021   PHOS 2.9 01/11/2021   PROT 7.0 01/13/2021   ALBUMIN 2.7 (L) 01/13/2021   BILITOT 0.4 01/13/2021   ALKPHOS 76 01/13/2021   AST 7 (L) 01/13/2021   ALT 16 01/13/2021   ANIONGAP 10 01/13/2021    CBG (last 3)  No results for input(s): GLUCAP in the last 72 hours.   GFR: Estimated Creatinine Clearance: 89.4 mL/min (by C-G formula based on SCr of 0.7 mg/dL).  Coagulation Profile: No results for input(s): INR, PROTIME in the last 168 hours.  Recent Results (from the past 240 hour(s))  Resp Panel by RT-PCR (Flu A&B, Covid) Nasopharyngeal Swab     Status: None   Collection Time: 01/10/21 11:55 PM   Specimen: Nasopharyngeal Swab; Nasopharyngeal(NP) swabs in vial transport medium  Result Value Ref Range Status   SARS Coronavirus 2 by RT PCR NEGATIVE NEGATIVE Final    Comment: (NOTE) SARS-CoV-2 target nucleic acids are NOT DETECTED.  The SARS-CoV-2 RNA is generally detectable in upper respiratory specimens during the acute phase of infection. The lowest concentration of SARS-CoV-2 viral copies this assay can detect is 138 copies/mL. A negative result does not preclude SARS-Cov-2 infection and should not be used as the sole basis for treatment or other patient  management decisions. A negative result may occur with  improper specimen collection/handling, submission of specimen other than nasopharyngeal swab, presence of viral mutation(s) within the areas targeted by this assay, and inadequate number of viral copies(<138 copies/mL). A negative result must be combined with clinical observations, patient history, and epidemiological information. The expected result is Negative.  Fact Sheet for Patients:  EntrepreneurPulse.com.au  Fact Sheet for Healthcare Providers:  IncredibleEmployment.be  This test is no t yet approved or cleared by the [place] FDA and  has been authorized for detection and/or diagnosis of SARS-CoV-2 by FDA under an Emergency Use Authorization (EUA). This EUA will remain  in effect (meaning this test can be used) for the duration of the COVID-19 declaration under Section 564(b)(1) of the Act, 21 U.S.C.section 360bbb-3(b)(1), unless the authorization is terminated  or revoked sooner.       Influenza A by PCR NEGATIVE NEGATIVE Final   Influenza B by PCR NEGATIVE NEGATIVE Final    Comment: (NOTE) The Xpert Xpress SARS-CoV-2/FLU/RSV plus assay is intended as an aid in the diagnosis of influenza from Nasopharyngeal swab specimens and should not be used as a sole basis for treatment. Nasal washings and aspirates are unacceptable for Xpert Xpress SARS-CoV-2/FLU/RSV testing.  Fact Sheet for Patients: EntrepreneurPulse.com.au  Fact Sheet for Healthcare Providers: IncredibleEmployment.be  This test is not yet approved or cleared by the [place] and has been authorized for detection and/or diagnosis of SARS-CoV-2 by FDA under an Emergency Use Authorization (EUA). This EUA will remain in effect (meaning this test can be used) for the duration of the COVID-19 declaration under Section 564(b)(1) of the Act, 21 U.S.C. section 360bbb-3(b)(1),  unless the authorization is terminated or revoked.  Performed at [facility], [address] [place], [place] [address]   Culture, blood (routine x 2)     Status: None (Preliminary result)   Collection Time: 01/11/21 10:16 AM   Specimen: Left Antecubital; Blood  Result Value Ref Range Status   Specimen Description LEFT ANTECUBITAL  Final   Special Requests   Final    BOTTLES DRAWN AEROBIC AND ANAEROBIC Blood Culture adequate volume   Culture   Final    NO GROWTH 2 DAYS Performed at [facility], [address] [place], [place] [address]    Report Status PENDING  Incomplete  Culture, blood (routine x 2)     Status: None (Preliminary result)   Collection Time: 01/11/21 10:18 AM   Specimen: BLOOD LEFT HAND  Result Value Ref Range Status   Specimen Description BLOOD LEFT HAND  Final   Special Requests   Final    BOTTLES DRAWN AEROBIC AND ANAEROBIC Blood Culture results may not be optimal due to an inadequate volume of blood received in culture bottles   Culture   Final    NO GROWTH 2 DAYS Performed at [facility], [address] [place], [place] [address]    Report Status PENDING  Incomplete  Gastrointestinal Panel by PCR , Stool     Status: None   Collection Time: 01/12/21  2:07 PM   Specimen: Stool  Result Value Ref Range Status   Campylobacter species NOT DETECTED NOT DETECTED Final   Plesimonas shigelloides NOT DETECTED NOT DETECTED Final   Salmonella species NOT DETECTED NOT DETECTED Final   Yersinia enterocolitica NOT DETECTED NOT DETECTED Final   Vibrio species NOT DETECTED NOT DETECTED Final   Vibrio cholerae NOT DETECTED NOT DETECTED Final   Enteroaggregative E coli (EAEC) NOT DETECTED NOT DETECTED Final   Enteropathogenic E coli (EPEC) NOT DETECTED NOT DETECTED Final   Enterotoxigenic E coli (ETEC) NOT DETECTED NOT DETECTED Final   Shiga like toxin producing E coli (STEC) NOT DETECTED NOT DETECTED Final   Shigella/Enteroinvasive E coli (EIEC) NOT DETECTED NOT  DETECTED Final   Cryptosporidium NOT DETECTED NOT DETECTED Final   Cyclospora cayetanensis NOT DETECTED NOT DETECTED Final   Entamoeba histolytica NOT DETECTED NOT DETECTED Final   Giardia lamblia NOT DETECTED NOT DETECTED Final   Adenovirus F40/41 NOT DETECTED NOT DETECTED Final   Astrovirus NOT DETECTED NOT DETECTED Final   Norovirus GI/GII NOT DETECTED NOT DETECTED Final   Rotavirus A NOT DETECTED NOT DETECTED Final   Sapovirus (I, II, IV, and V) NOT DETECTED NOT DETECTED Final    Comment: Performed at [facility], [address] [place], [place] [address]  C Difficile Quick Screen w PCR reflex     Status: None   Collection Time: 01/12/21  2:08 PM   Specimen: Stool  Result Value Ref Range Status   C Diff antigen NEGATIVE NEGATIVE Final   C Diff toxin NEGATIVE NEGATIVE Final   C Diff interpretation No C. difficile detected.  Final    Comment: Performed at [facility], [address], [place], [place] [address]  Radiology Studies: No results found.      Scheduled Meds:  enoxaparin (LOVENOX) injection  40 mg Subcutaneous Q24H   feeding supplement  237 mL Oral BID BM   Continuous Infusions:  cefTRIAXone (ROCEPHIN)  IV 1 g (01/13/21 0859)   lactated ringers 100 mL/hr at 01/11/21 2223   metronidazole 500 mg (01/13/21 1000)     LOS: 2 days     Cordelia Poche, MD [facility] 01/13/2021, 1:39 PM  If 7PM-7AM, please contact night-coverage [email or web address]

## 2021-01-13 NOTE — Plan of Care (Signed)
  Problem: Education: Goal: Knowledge of General Education information will improve Description: Including pain rating scale, medication(s)/side effects and non-pharmacologic comfort measures Outcome: Progressing   Problem: Coping: Goal: Level of anxiety will decrease Outcome: Progressing   Problem: Nutrition: Goal: Adequate nutrition will be maintained Outcome: Progressing   Problem: Pain Managment: Goal: General experience of comfort will improve Outcome: Progressing   Problem: Safety: Goal: Ability to remain free from injury will improve Outcome: Progressing

## 2021-01-13 NOTE — Hospital Course (Addendum)
Diana Fox is a 50 y.o. female with a history of hypertension. Patient presented secondary to abdominal pain and diarrhea and was found to have evidence of sigmoid colitis in addition to enteritis with evidence of small bowel fistulization, concerning for IBD vs infection. CT imaging was also concerning for possible perforation and abscess. General surgery consulted with recommendation for no continued management. GI consulted for colitis. Patient managed on empiric antibiotics.

## 2021-01-14 ENCOUNTER — Telehealth: Payer: Self-pay | Admitting: Gastroenterology

## 2021-01-14 LAB — CBC WITH DIFFERENTIAL/PLATELET
Abs Immature Granulocytes: 0.51 10*3/uL — ABNORMAL HIGH (ref 0.00–0.07)
Basophils Absolute: 0.1 10*3/uL (ref 0.0–0.1)
Basophils Relative: 1 %
Eosinophils Absolute: 0 10*3/uL (ref 0.0–0.5)
Eosinophils Relative: 0 %
HCT: 29.7 % — ABNORMAL LOW (ref 36.0–46.0)
Hemoglobin: 9.3 g/dL — ABNORMAL LOW (ref 12.0–15.0)
Immature Granulocytes: 3 %
Lymphocytes Relative: 10 %
Lymphs Abs: 1.4 10*3/uL (ref 0.7–4.0)
MCH: 23.6 pg — ABNORMAL LOW (ref 26.0–34.0)
MCHC: 31.3 g/dL (ref 30.0–36.0)
MCV: 75.4 fL — ABNORMAL LOW (ref 80.0–100.0)
Monocytes Absolute: 0.8 10*3/uL (ref 0.1–1.0)
Monocytes Relative: 6 %
Neutro Abs: 12 10*3/uL — ABNORMAL HIGH (ref 1.7–7.7)
Neutrophils Relative %: 80 %
Platelets: 511 10*3/uL — ABNORMAL HIGH (ref 150–400)
RBC: 3.94 MIL/uL (ref 3.87–5.11)
RDW: 15.6 % — ABNORMAL HIGH (ref 11.5–15.5)
WBC: 14.8 10*3/uL — ABNORMAL HIGH (ref 4.0–10.5)
nRBC: 0 % (ref 0.0–0.2)

## 2021-01-14 LAB — COMPREHENSIVE METABOLIC PANEL
ALT: 16 U/L (ref 0–44)
AST: 17 U/L (ref 15–41)
Albumin: 2.6 g/dL — ABNORMAL LOW (ref 3.5–5.0)
Alkaline Phosphatase: 74 U/L (ref 38–126)
Anion gap: 8 (ref 5–15)
BUN: 14 mg/dL (ref 6–20)
CO2: 23 mmol/L (ref 22–32)
Calcium: 8.4 mg/dL — ABNORMAL LOW (ref 8.9–10.3)
Chloride: 105 mmol/L (ref 98–111)
Creatinine, Ser: 0.69 mg/dL (ref 0.44–1.00)
GFR, Estimated: >60 mL/min (ref >60)
Glucose, Bld: 102 mg/dL — ABNORMAL HIGH (ref 70–99)
Potassium: 3.1 mmol/L — ABNORMAL LOW (ref 3.5–5.1)
Sodium: 136 mmol/L (ref 135–145)
Total Bilirubin: 0.5 mg/dL (ref 0.3–1.2)
Total Protein: 6.8 g/dL (ref 6.5–8.1)

## 2021-01-14 LAB — PROCALCITONIN: Procalcitonin: 0.94 ng/mL

## 2021-01-14 MED ORDER — POTASSIUM CHLORIDE CRYS ER 20 MEQ PO TBCR: 20.0000 meq | EXTENDED_RELEASE_TABLET | Freq: Every day | ORAL | 0 refills | Status: DC

## 2021-01-14 MED ORDER — ENSURE ENLIVE PO LIQD
237.0000 mL | Freq: Two times a day (BID) | ORAL | 60 refills | Status: DC
Start: 2021-01-15 — End: 2021-03-22

## 2021-01-14 MED ORDER — POTASSIUM CHLORIDE CRYS ER 20 MEQ PO TBCR
40.0000 meq | EXTENDED_RELEASE_TABLET | ORAL | Status: DC
  Administered 2021-01-14: 40 meq via ORAL
  Filled 2021-01-14: qty 2

## 2021-01-14 MED ORDER — AMOXICILLIN-POT CLAVULANATE 500-125 MG PO TABS: 1.0000 | ORAL_TABLET | Freq: Three times a day (TID) | ORAL | 0 refills | Status: AC

## 2021-01-14 NOTE — Plan of Care (Signed)

## 2021-01-14 NOTE — Telephone Encounter (Signed)
Patient needs hospital follow up in 2 weeks with Dr. Abbey Chatters preferably or APP. Can use urgent.

## 2021-01-14 NOTE — Progress Notes (Signed)
Subjective:  Patient feeling much better and hopes she can go home today. Tolerating diet. Last BM yesterday and was normal.   Objective: Vital signs in last 24 hours: Temp:  [97.9 F (36.6 C)-98.9 F (37.2 C)] 97.9 F (36.6 C) (12/08 0422) Pulse Rate:  [86-95] 86 (12/08 0422) Resp:  [18-20] 20 (12/08 0422) BP: (140-147)/(72-88) 147/88 (12/08 0422) SpO2:  [99 %-100 %] 100 % (12/08 0422) Last BM Date: 01/13/21 General:   Alert,  Well-developed, well-nourished, pleasant and cooperative in NAD Abdomen:  Soft, full, mild right sided tenderness. Normal bowel sounds, without guarding, and without rebound.   Extremities:  Without clubbing, deformity or edema. Neurologic:  Alert and  oriented x4;  grossly normal neurologically. Skin:  Intact without significant lesions or rashes. Psych:  Alert and cooperative. Normal mood and affect.  Intake/Output from previous day: 12/07 0701 - 12/08 0700 In: 360 [P.O.:360] Out: 3 [Urine:3] Intake/Output this shift: No intake/output data recorded.  Lab Results: CBC Recent Labs    01/12/21 0531 01/13/21 0600 01/14/21 0501  WBC 18.6* 19.8* 14.8*  HGB 9.1* 9.5* 9.3*  HCT 28.2* 29.0* 29.7*  MCV 77.7* 75.7* 75.4*  PLT 466* 500* 511*   BMET Recent Labs    01/12/21 0531 01/13/21 0600 01/14/21 0501  NA 136 136 136  K 3.6 3.1* 3.1*  CL 107 104 105  CO2 22 22 23   GLUCOSE 106* 104* 102*  BUN 10 14 14   CREATININE 0.72 0.70 0.69  CALCIUM 8.4* 8.5* 8.4*   LFTs Recent Labs    01/12/21 0531 01/13/21 0600 01/14/21 0501  BILITOT 0.5 0.4 0.5  ALKPHOS 78 76 74  AST 9* 7* 17  ALT 16 16 16   PROT 6.1* 7.0 6.8  ALBUMIN 2.5* 2.7* 2.6*   No results for input(s): LIPASE in the last 72 hours. PT/INR No results for input(s): LABPROT, INR in the last 72 hours.    Imaging Studies: CT ABDOMEN PELVIS W CONTRAST  Result Date: 01/10/2021 CLINICAL DATA:  Abdominal pain and pressure. EXAM: CT ABDOMEN AND PELVIS WITH CONTRAST TECHNIQUE: Multidetector  CT imaging of the abdomen and pelvis was performed using the standard protocol following bolus administration of intravenous contrast. CONTRAST:  73mL OMNIPAQUE IOHEXOL 300 MG/ML  SOLN COMPARISON:  There is no prior CT for comparison. Limited comparison is available with a pelvic ultrasound 12/13/2018. FINDINGS: Lower chest: There are patchy opacities in the lower lobes which could be due to pneumonia or atelectasis. There is no pleural effusion. There is mild-to-moderate pan chamber cardiomegaly. Hepatobiliary: 21 cm in length slightly steatotic. Gallbladder is absent with postcholecystectomy extrahepatic bile duct prominence with common bile duct 8 mm without visible filling defect and slight intrahepatic biliary prominence. No mass enhancement is seen in the liver. Pancreas: Unremarkable. No pancreatic ductal dilatation or surrounding inflammatory changes. Spleen: Normal in size without focal abnormality. Adrenals/Urinary Tract: There is no adrenal mass, no focal abnormality of the renal cortex and no appreciable calculus or hydronephrosis. The bladder thickness is normal. Stomach/Bowel: The gastric wall and upper and mid abdominal small bowel are unremarkable without contrast. There is dilatation of some of the mid to lower abdominal small bowel up to 3.2 cm and relative decompression in the lower abdominal small bowel with occasional right lower quadrant segments showing mild thickening. A focal transitional segment was not seen. An approximately 10 cm length of the distal sigmoid colon demonstrates moderate circumferential wall thickening and adjacent inflammatory reaction. There is a 1.8 x 2.4 cm rim enhancing structure  medial to the diseased segment just anterior to L4-5 right of the midline which could represent a contained perforation or abutting necrotic lymph node. There are additional mildly prominent bilateral common iliac chain nodes common both sides up to 1.1 cm short axis posterior to this process.  There is suspected fistula to a small bowel loop at the proximal end of the diseased segment as seen on axial images 45-47 and also suspected on axial images 46-49. Inflammatory changes are also present in the mesentery at the sites of suspected fistulization. There is scattered fluid in the mesenteric folds in the mid to lower abdomen as well noted as well as mild perihepatic and paracolic gutter low-density free fluid, but there is no visible free air in the abdomen or the pelvis. The rest of the colonic wall is unremarkable. The appendix is normal in caliber. The terminal ileum is within normal limits. There is mild fecal stasis in the ascending and transverse colon. There are scattered diverticula along the left colon. Vascular/Lymphatic: No significant vascular findings. As above there are mildly prominent bilateral common iliac chain nodes and either a necrotic 2.5 x 1.8 cm lymph node or a contained perforation adjacent to the midportion of the diseased sigmoid segment described above. There is no further adenopathy visible. Reproductive: Enlarged fibroid uterus is noted as on the prior ultrasound. Largest fibroid is a calcified leiomyoma in the right body of uterus. The ovaries are follicular but do not appear enlarged. Other: Pelvic phleboliths. Mild mesenteric, upper abdominal and posterior pelvic ascites. No appreciable pneumoperitoneum. Small umbilical fat hernia. Musculoskeletal: No acute or significant osseous findings. 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 There are bilateral mildly  enlarged common iliac chain nodes associated with this. This could be an acute on chronic diverticulitis or could be an inflamed carcinoma. There is mild associated ascites. 2. There are mildly dilated mid to lower abdominal small bowel segments up to 3.2 cm without visible transition and decompressed but occasionally thickened small bowel segments in the right lower abdomen. A low-grade small-bowel obstruction potentially related to the inflammatory process is possible and there could be additional small bowel enteritis in the right lower abdomen. Does the patient have any history of inflammatory bowel disease? 3. Mildly prominent slightly steatotic liver. 4. Post cholecystectomy biliary prominence without a visible biliary ductal filling defect. Laboratory and clinical correlation advised. 5. Enlarged fibroid uterus. 6. Umbilical fat hernia. 7. Cardiomegaly. 8. Opacities in the lower lobes which could be atelectasis, pneumonia or combination. Electronically Signed   By: Telford Nab M.D.   On: 01/10/2021 23:07  [2 weeks]   Assessment:  50 year old female presenting with abdominal pain and CT findings of colitis with diffusely thickened 10 cm segment of distal sigmoid colon and suspected small bowel fistulization, low-density structure medial to mid diseased segment, suspicious for necrotic lymph node or contained perforation/abscess, mildly dilated mid to lower abdominal small bowel segments without obvious transition point, unable to exclude low-grade SBO in light of inflammatory process or small bowel enteritis.  GI/general surgery both consulted.  Colitis: Several day history of diarrhea at Thanksgiving but resolved on own. Although today  she mentions that for 2-3 months she has had postprandial soft to loose stool off/on.  Recent bout of abdominal pain but no diarrhea.  Stool studies ordered if recurrent diarrhea.  C. difficile negative.  GI pathogen panel negative.  She denies melena or rectal  bleeding.  Her current symptoms along with imaging and laboratory abnormalities are concerning for possible Crohn's disease.  White blood cell count increased at the highest yesterday at 19,800, down to 14,800 today. CRP 33.5 and sed rate 64.  She is on empiric antibiotics since admission.  General surgery has evaluated patient does not recommend surgical intervention at this time for suspected abscess.  She will require outpatient colonoscopy after acute illness resolves as IBD or malignancy cannot be excluded.  Microcytic anemia: Chronic finding, no overt GI bleeding or menorrhagia.  Hemoglobin 11.2 on admission.  Hemoglobin has been stable at 9.3 over the last 48 hours.  Ferritin 108 likely APR.  Iron 10, iron saturation is 4%, TIBC 256.  Will need colonoscopy with possible upper endoscopy as an outpatient.    Plan: Complete 2 weeks of antibiotics. Replete K per attending. Follow-up TPMT, TB Gold Avoid NSAIDs. Outpatient colonoscopy+/-EGD 6 to 8 weeks after acute illness but will need short interval outpatient follow up.   Laureen Ochs. Bernarda Caffey [facility] [phone] 12/8/20221:51 PM    LOS: 3 days

## 2021-01-14 NOTE — Progress Notes (Signed)
Initial Nutrition Assessment  DOCUMENTATION CODES:   Not applicable  INTERVENTION:   Ensure Enlive po BID, each supplement provides 350 kcal and 20 grams of protein   NUTRITION DIAGNOSIS:   Inadequate oral intake related to acute illness (colitis) as evidenced by energy intake < or equal to 50% for > or equal to 5 days.   GOAL:  Patient will meet greater than or equal to 90% of their needs   MONITOR:  Diet advancement, Supplement acceptance, PO intake, Labs, Weight trends  REASON FOR ASSESSMENT:   Consult Assessment of nutrition requirement/status  ASSESSMENT: Patient is a 50 yo female with hx of hypertension, anemia, back pain. Presents complaint abdominal pain.   CT abdomen and pelvis- 10 cm segment of the distal sigmoid colon with adjacent moderate inflammatory reaction, A low-grade small-bowel obstruction, possible small bowel enteritis right lower abdomen.   Patient diet advanced soft foods 12/7 dinner. PO: meals 0-50% x 4 meals. Patient denies pain with eating. Low sodium foods at home and complained the fish yesterday was too salty.   Patient is up in chair this morning and says she is not sleeping well on the hospital mattress.   Medications  include: Antibiotics, IVF-LR@100  ml/hr.   Weight history: UBW  80-82 kg kg the past year.   Labs: BMP Latest Ref Rng & Units 01/14/2021 01/13/2021 01/12/2021  Glucose 70 - 99 mg/dL 102(H) 104(H) 106(H)  BUN 6 - 20 mg/dL 14 14 10   Creatinine 0.44 - 1.00 mg/dL 0.69 0.70 0.72  Sodium 135 - 145 mmol/L 136 136 136  Potassium 3.5 - 5.1 mmol/L 3.1(L) 3.1(L) 3.6  Chloride 98 - 111 mmol/L 105 104 107  CO2 22 - 32 mmol/L 23 22 22   Calcium 8.9 - 10.3 mg/dL 8.4(L) 8.5(L) 8.4(L)      NUTRITION - FOCUSED PHYSICAL EXAM:  Flowsheet Row Most Recent Value  Orbital Region No depletion  Upper Arm Region No depletion  Thoracic and Lumbar Region No depletion  Buccal Region No depletion  Temple Region No depletion  Clavicle Bone Region  No depletion  Clavicle and Acromion Bone Region No depletion  Scapular Bone Region No depletion  Dorsal Hand No depletion  Patellar Region No depletion  Anterior Thigh Region No depletion  Posterior Calf Region No depletion  Edema (RD Assessment) None  Hair Unable to assess  [pt hair covered]  Eyes Reviewed  Mouth Reviewed  Skin Reviewed  Nails Unable to assess       Diet Order:   Diet Order             DIET SOFT Room service appropriate? Yes; Fluid consistency: Thin  Diet effective now                   EDUCATION NEEDS:  Education needs have been addressed  Skin:  Skin Assessment: Reviewed RN Assessment  Last BM:  12/7 type 7 diarrhea  Height:   Ht Readings from Last 1 Encounters:  01/12/21 5\' 5"  (1.651 m)    Weight:   Wt Readings from Last 1 Encounters:  01/12/21 80.9 kg    Ideal Body Weight:   57 kg  BMI:  Body mass index is 29.68 kg/m.  Estimated Nutritional Needs:   Kcal:  1750-1900  Protein:  85-90 gr  Fluid:  1800 ml daily  Colman Cater MS,RD,CSG,LDN Contact: Shea Evans

## 2021-01-14 NOTE — Plan of Care (Signed)
  Problem: Education: Goal: Knowledge of General Education information will improve Description: Including pain rating scale, medication(s)/side effects and non-pharmacologic comfort measures Outcome: Progressing   Problem: Clinical Measurements: Goal: Ability to maintain clinical measurements within normal limits will improve Outcome: Progressing Goal: Diagnostic test results will improve Outcome: Progressing   

## 2021-01-14 NOTE — Discharge Instructions (Signed)
Diana Fox,  You were in the hospital with abdominal pain and diarrhea. Your workup was concerning for possible inflammatory disease or a possible infection. You have been treated with antibiotics with improvement. If you have any worsening symptoms of abdominal pain, fevers, malaise, please return as you may need repeat imaging of your abdomen. Please continue your antibiotics as prescribed.

## 2021-01-14 NOTE — Progress Notes (Signed)
Patient discharged home today, transported by family. Discharge paperwork went over with patient, patient verbalized understanding. Belongings sent home with patient.

## 2021-01-14 NOTE — Discharge Summary (Signed)
Physician Discharge Summary  Diana Fox WFU:[id number] DOB: 14-Feb-1970 DOA: 01/10/2021  PCP: [facility]  Admit date: 01/10/2021 Discharge date: 01/14/2021  Admitted From: Home Disposition: Home  Recommendations for Outpatient Follow-up:  Follow up with PCP in 1 week Follow up with GI as an outpatient for colonoscopy/possible upper endoscopy Please obtain BMP/CBC in one week Please follow up on the following pending results: None  Home Health: None Equipment/Devices: None  Discharge Condition: Stable CODE STATUS: Full code Diet recommendation: Soft diet   Brief/Interim Summary:  Admission HPI written by Bernadette Hoit, DO   HPI: Diana Fox is a 50 y.o. female with medical history significant for hypertension who presents to the emergency department due to abdominal pain which started 2 days ago but worsened yesterday in the morning around 4:30 AM.  Patient states that he initially had abdominal pain which started on Thanksgiving, but this self resolved 3 to 4 days after thanksgiving.  Pain was diffuse,  it was sharp and dull in nature and was rated at 8/10 on pain scale, pain was alleviated with warm blanket, but worsens with movement.  It was intermittent.  Last bowel movement was on 12/2, however, she endorsed some diarrhea with initial abdominal pain. Patient denies fever, chills, nausea, vomiting, diarrhea, chest pain, shortness of breath.   Hospital course:  * Colitis Diagnosed via CT with associated diarrhea and abdominal pain. General surgery consulted for possible perforation/abscess and GI consulted for sigmoid colitis. General surgery with no recommendations for management at this time and have signed off. GI with concern for possible IBD vs infection. Patient started empirically on antibiotics. Symptoms improving. GI recommending outpatient follow-up. Patient transitioned from Ceftriaxone/Flagyl to Augmentin on  discharge.  Hypokalemia Supplementation  Intra-abdominal abscess ([place]) See colitis  Small bowel fistula See colitis  Iron deficiency anemia Unsure of etiology but patient is not post-menopausal. Iron of 10 with normal TIBC and ferritin; iron saturation is low. Patient started on iron supplementation. GI on board and will likely perform colonoscopy at some point as an outpatient. No evidence of acute bleeding.  Atelectasis Noted on chest imaging. Patient started on azithromycin which was discontinued secondary to a hives reaction. Low suspicion for pneumonia, so no atypical coverage restarted.   Essential hypertension Patient is on nebivolol as an outpatient which was held on admission. Continue home regimen.  Hypoalbuminemia In the setting of possible inflammatory bowel disease. Dietitian recommending protein supplementation.  Leukocytosis Most likely secondary to widespread inflammation and/or infection. C. Difficile negative. GI pathogen panel Negative. Leukocytosis improving prior to discharge.  Hyponatremia-resolved as of 01/13/2021 Mild. Resolved.  SBO (small bowel obstruction) (HCC)-resolved as of 01/13/2021 Diagnosed via CT. Patient without significant emesis. Clinically, not consistent with SBO. General surgery consulted with no recommendations.    Discharge Diagnoses:  Principal Problem:   Colitis Active Problems:   Leukocytosis   Hypoalbuminemia   Essential hypertension   Atelectasis   Iron deficiency anemia   Small bowel fistula   Intra-abdominal abscess (HCC)   Hypokalemia    Discharge Instructions   Allergies as of 01/14/2021       Reactions   Azithromycin Hives   infusing arm began to swell , her face got red, and she broke out, there are bumps around where the IV site was infusing   Motrin [ibuprofen] Itching   Lisinopril    cough   Sudafed [pseudoephedrine Hcl] Other (See Comments)   Hypertension        Medication List  STOP taking  these medications    azithromycin 250 MG tablet Commonly known as: ZITHROMAX   benzocaine 10 % mucosal gel Commonly known as: ORAJEL   loratadine 10 MG tablet Commonly known as: CLARITIN   methocarbamol 500 MG tablet Commonly known as: ROBAXIN   mupirocin nasal ointment 2 % Commonly known as: BACTROBAN   naproxen 500 MG tablet Commonly known as: NAPROSYN   pseudoephedrine 30 MG tablet Commonly known as: SUDAFED   traMADol 50 MG tablet Commonly known as: ULTRAM       TAKE these medications    amoxicillin-clavulanate 500-125 MG tablet Commonly known as: Augmentin Take 1 tablet (500 mg total) by mouth 3 (three) times daily for 10 days. Start taking on: January 15, 2021   Cholecalciferol 50 MCG (2000 UT) Tabs Take by mouth.   feeding supplement Liqd Take 237 mLs by mouth 2 (two) times daily between meals. Start taking on: January 15, 2021   fexofenadine 180 MG tablet Commonly known as: ALLEGRA Take 1 tablet (180 mg total) by mouth daily.   fluticasone 50 MCG/ACT nasal spray Commonly known as: FLONASE Place 2 sprays into both nostrils daily.   multivitamin tablet Take 1 tablet by mouth daily.   nebivolol 10 MG tablet Commonly known as: Bystolic Take 1 tablet (10 mg total) by mouth daily.   potassium chloride SA 20 MEQ tablet Commonly known as: KLOR-CON M Take 1 tablet (20 mEq total) by mouth daily for 3 days. Start taking on: January 15, 2021        Allergies  Allergen Reactions   Azithromycin Hives    infusing arm began to swell , her face got red, and she broke out, there are bumps around where the IV site was infusing   Motrin [Ibuprofen] Itching   Lisinopril     cough   Sudafed [Pseudoephedrine Hcl] Other (See Comments)    Hypertension    Consultations: Gastroenterology General surgery   Procedures/Studies: CT ABDOMEN PELVIS W CONTRAST  Result Date: 01/10/2021 CLINICAL DATA:  Abdominal pain and pressure. EXAM: CT ABDOMEN AND PELVIS  WITH CONTRAST TECHNIQUE: Multidetector CT imaging of the abdomen and pelvis was performed using the standard protocol following bolus administration of intravenous contrast. CONTRAST:  54mL OMNIPAQUE IOHEXOL 300 MG/ML  SOLN COMPARISON:  There is no prior CT for comparison. Limited comparison is available with a pelvic ultrasound 12/13/2018. FINDINGS: Lower chest: There are patchy opacities in the lower lobes which could be due to pneumonia or atelectasis. There is no pleural effusion. There is mild-to-moderate pan chamber cardiomegaly. Hepatobiliary: 21 cm in length slightly steatotic. Gallbladder is absent with postcholecystectomy extrahepatic bile duct prominence with common bile duct 8 mm without visible filling defect and slight intrahepatic biliary prominence. No mass enhancement is seen in the liver. Pancreas: Unremarkable. No pancreatic ductal dilatation or surrounding inflammatory changes. Spleen: Normal in size without focal abnormality. Adrenals/Urinary Tract: There is no adrenal mass, no focal abnormality of the renal cortex and no appreciable calculus or hydronephrosis. The bladder thickness is normal. Stomach/Bowel: The gastric wall and upper and mid abdominal small bowel are unremarkable without contrast. There is dilatation of some of the mid to lower abdominal small bowel up to 3.2 cm and relative decompression in the lower abdominal small bowel with occasional right lower quadrant segments showing mild thickening. A focal transitional segment was not seen. An approximately 10 cm length of the distal sigmoid colon demonstrates moderate circumferential wall thickening and adjacent inflammatory reaction. There is a 1.8 x  2.4 cm rim enhancing structure medial to the diseased segment just anterior to L4-5 right of the midline which could represent a contained perforation or abutting necrotic lymph node. There are additional mildly prominent bilateral common iliac chain nodes common both sides up to 1.1  cm short axis posterior to this process. There is suspected fistula to a small bowel loop at the proximal end of the diseased segment as seen on axial images 45-47 and also suspected on axial images 46-49. Inflammatory changes are also present in the mesentery at the sites of suspected fistulization. There is scattered fluid in the mesenteric folds in the mid to lower abdomen as well noted as well as mild perihepatic and paracolic gutter low-density free fluid, but there is no visible free air in the abdomen or the pelvis. The rest of the colonic wall is unremarkable. The appendix is normal in caliber. The terminal ileum is within normal limits. There is mild fecal stasis in the ascending and transverse colon. There are scattered diverticula along the left colon. Vascular/Lymphatic: No significant vascular findings. As above there are mildly prominent bilateral common iliac chain nodes and either a necrotic 2.5 x 1.8 cm lymph node or a contained perforation adjacent to the midportion of the diseased sigmoid segment described above. There is no further adenopathy visible. Reproductive: Enlarged fibroid uterus is noted as on the prior ultrasound. Largest fibroid is a calcified leiomyoma in the right body of uterus. The ovaries are follicular but do not appear enlarged. Other: Pelvic phleboliths. Mild mesenteric, upper abdominal and posterior pelvic ascites. No appreciable pneumoperitoneum. Small umbilical fat hernia. Musculoskeletal: No acute or significant osseous findings. 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 There is a diffusely thickened, roughly 10 cm segment of the distal sigmoid colon with adjacent moderate inflammatory reaction, suspected 2 sites of small bowel fistulization to the diseased segment, and a rim enhancing low-density 2.5 x 1.8 cm structure medial to the mid diseased segment which could represent a necrotic lymph node or contained  perforation/abscess. There are bilateral mildly enlarged common iliac chain nodes associated with this. This could be an acute on chronic diverticulitis or could be an inflamed carcinoma. There is mild associated ascites. 2. There are mildly dilated mid to lower abdominal small bowel segments up to 3.2 cm without visible transition and decompressed but occasionally thickened small bowel segments in the right lower abdomen. A low-grade small-bowel obstruction potentially related to the inflammatory process is possible and there could be additional small bowel enteritis in the right lower abdomen. Does the patient have any history of inflammatory bowel disease? 3. Mildly prominent slightly steatotic liver. 4. Post cholecystectomy biliary prominence without a visible biliary ductal filling defect. Laboratory and clinical correlation advised. 5. Enlarged fibroid uterus. 6. Umbilical fat hernia. 7. Cardiomegaly. 8. Opacities in the lower lobes which could be atelectasis, pneumonia or combination. Electronically Signed   By: Telford Nab M.D.   On: 01/10/2021 23:07     Subjective: No abdominal pain, nausea, vomiting. No fevers. Feels better. No diarrhea.  Discharge Exam: Vitals:   01/13/21 2113 01/14/21 0422  BP: (!) 145/83 (!) 147/88  Pulse: 95 86  Resp: 20 20  Temp: 98.9 F (37.2 C) 97.9 F (36.6 C)  SpO2: 99% 100%   Vitals:   01/13/21 0616 01/13/21 1435 01/13/21 2113 01/14/21 0422  BP: (!) 141/74 140/72 (!) 145/83 (!) 147/88  Pulse: 92 90 95 86  Resp: 19 18 20 20   Temp:  98.3 F (36.8 C) 98.4 F (36.9 C) 98.9 F (37.2 C) 97.9 F (36.6 C)  TempSrc: Oral Oral Oral Oral  SpO2: 100% 100% 99% 100%  Weight:      Height:        General: Pt is alert, awake, not in acute distress Cardiovascular: RRR, S1/S2 +, no rubs, no gallops Respiratory: CTA bilaterally, no wheezing, no rhonchi Abdominal: Soft, NT, ND, bowel sounds + Extremities: no edema, no cyanosis    The results of significant  diagnostics from this hospitalization (including imaging, microbiology, ancillary and laboratory) are listed below for reference.     Microbiology: Recent Results (from the past 240 hour(s))  Resp Panel by RT-PCR (Flu A&B, Covid) Nasopharyngeal Swab     Status: None   Collection Time: 01/10/21 11:55 PM   Specimen: Nasopharyngeal Swab; Nasopharyngeal(NP) swabs in vial transport medium  Result Value Ref Range Status   SARS Coronavirus 2 by RT PCR NEGATIVE NEGATIVE Final    Comment: (NOTE) SARS-CoV-2 target nucleic acids are NOT DETECTED.  The SARS-CoV-2 RNA is generally detectable in upper respiratory specimens during the acute phase of infection. The lowest concentration of SARS-CoV-2 viral copies this assay can detect is 138 copies/mL. A negative result does not preclude SARS-Cov-2 infection and should not be used as the sole basis for treatment or other patient management decisions. A negative result may occur with  improper specimen collection/handling, submission of specimen other than nasopharyngeal swab, presence of viral mutation(s) within the areas targeted by this assay, and inadequate number of viral copies(<138 copies/mL). A negative result must be combined with clinical observations, patient history, and epidemiological information. The expected result is Negative.  Fact Sheet for Patients:  EntrepreneurPulse.com.au  Fact Sheet for Healthcare Providers:  IncredibleEmployment.be  This test is no t yet approved or cleared by the [place] FDA and  has been authorized for detection and/or diagnosis of SARS-CoV-2 by FDA under an Emergency Use Authorization (EUA). This EUA will remain  in effect (meaning this test can be used) for the duration of the COVID-19 declaration under Section 564(b)(1) of the Act, 21 U.S.C.section 360bbb-3(b)(1), unless the authorization is terminated  or revoked sooner.       Influenza A by PCR NEGATIVE  NEGATIVE Final   Influenza B by PCR NEGATIVE NEGATIVE Final    Comment: (NOTE) The Xpert Xpress SARS-CoV-2/FLU/RSV plus assay is intended as an aid in the diagnosis of influenza from Nasopharyngeal swab specimens and should not be used as a sole basis for treatment. Nasal washings and aspirates are unacceptable for Xpert Xpress SARS-CoV-2/FLU/RSV testing.  Fact Sheet for Patients: EntrepreneurPulse.com.au  Fact Sheet for Healthcare Providers: IncredibleEmployment.be  This test is not yet approved or cleared by the [place] FDA and has been authorized for detection and/or diagnosis of SARS-CoV-2 by FDA under an Emergency Use Authorization (EUA). This EUA will remain in effect (meaning this test can be used) for the duration of the COVID-19 declaration under Section 564(b)(1) of the Act, 21 U.S.C. section 360bbb-3(b)(1), unless the authorization is terminated or revoked.  Performed at [facility], [address], [place], [place] [address]   Culture, blood (routine x 2)     Status: None (Preliminary result)   Collection Time: 01/11/21 10:16 AM   Specimen: Left Antecubital; Blood  Result Value Ref Range Status   Specimen Description LEFT ANTECUBITAL  Final   Special Requests   Final    BOTTLES DRAWN AEROBIC AND ANAEROBIC Blood Culture adequate volume   Culture  Final    NO GROWTH 2 DAYS Performed at [facility], [address] [place], [place] [address]    Report Status PENDING  Incomplete  Culture, blood (routine x 2)     Status: None (Preliminary result)   Collection Time: 01/11/21 10:18 AM   Specimen: BLOOD LEFT HAND  Result Value Ref Range Status   Specimen Description BLOOD LEFT HAND  Final   Special Requests   Final    BOTTLES DRAWN AEROBIC AND ANAEROBIC Blood Culture results may not be optimal due to an inadequate volume of blood received in culture bottles   Culture   Final    NO GROWTH 2 DAYS Performed at [facility], [address], [place], [place] [address]    Report Status PENDING  Incomplete  Gastrointestinal Panel by PCR , Stool     Status: None   Collection Time: 01/12/21  2:07 PM   Specimen: Stool  Result Value Ref Range Status   Campylobacter species NOT DETECTED NOT DETECTED Final   Plesimonas shigelloides NOT DETECTED NOT DETECTED Final   Salmonella species NOT DETECTED NOT DETECTED Final   Yersinia enterocolitica NOT DETECTED NOT DETECTED Final   Vibrio species NOT DETECTED NOT DETECTED Final   Vibrio cholerae NOT DETECTED NOT DETECTED Final   Enteroaggregative E coli (EAEC) NOT DETECTED NOT DETECTED Final   Enteropathogenic E coli (EPEC) NOT DETECTED NOT DETECTED Final   Enterotoxigenic E coli (ETEC) NOT DETECTED NOT DETECTED Final   Shiga like toxin producing E coli (STEC) NOT DETECTED NOT DETECTED Final   Shigella/Enteroinvasive E coli (EIEC) NOT DETECTED NOT DETECTED Final   Cryptosporidium NOT DETECTED NOT DETECTED Final   Cyclospora cayetanensis NOT DETECTED NOT DETECTED Final   Entamoeba histolytica NOT DETECTED NOT DETECTED Final   Giardia lamblia NOT DETECTED NOT DETECTED Final   Adenovirus F40/41 NOT DETECTED NOT DETECTED Final   Astrovirus NOT DETECTED NOT DETECTED Final   Norovirus GI/GII NOT DETECTED NOT DETECTED Final   Rotavirus A NOT DETECTED NOT DETECTED Final   Sapovirus (I, II, IV, and V) NOT DETECTED NOT DETECTED Final    Comment: Performed at [facility], [address] [place], [place] [address]  C Difficile Quick Screen w PCR reflex     Status: None   Collection Time: 01/12/21  2:08 PM   Specimen: Stool  Result Value Ref Range Status   C Diff antigen NEGATIVE NEGATIVE Final   C Diff toxin NEGATIVE NEGATIVE Final   C Diff interpretation No C. difficile detected.  Final    Comment: Performed at [facility], [address], [place],  [address]     Labs: BNP (last 3 results) No results for input(s): BNP in the last 8760 hours. Basic  Metabolic Panel: Recent Labs  Lab 01/10/21 1849 01/11/21 0427 01/12/21 0531 01/13/21 0600 01/14/21 0501  NA 133* 132* 136 136 136  K 3.8 3.6 3.6 3.1* 3.1*  CL 102 105 107 104 105  CO2 22 21* 22 22 23   GLUCOSE 126* 112* 106* 104* 102*  BUN 8 8 10 14 14   CREATININE 0.77 0.65 0.72 0.70 0.69  CALCIUM 8.8* 8.4* 8.4* 8.5* 8.4*  MG  --  1.7  --   --   --   PHOS  --  2.9  --   --   --    Liver Function Tests: Recent Labs  Lab 01/10/21 1849 01/11/21 0427 01/12/21 0531 01/13/21 0600 01/14/21 0501  AST 25 14* 9* 7* 17  ALT 35 26 16 16 16   ALKPHOS 107 86 78 76 74  BILITOT 1.7* 1.9* 0.5 0.4 0.5  PROT 7.4 6.7 6.1* 7.0 6.8  ALBUMIN 3.2* 2.8* 2.5* 2.7* 2.6*   Recent Labs  Lab 01/10/21 1849  LIPASE 24   No results for input(s): AMMONIA in the last 168 hours. CBC: Recent Labs  Lab 01/10/21 1849 01/11/21 0427 01/12/21 0531 01/13/21 0600 01/14/21 0501  WBC 15.5* 18.0* 18.6* 19.8* 14.8*  NEUTROABS  --   --  16.5* 17.0* 12.0*  HGB 11.2* 10.2* 9.1* 9.5* 9.3*  HCT 35.9* 32.2* 28.2* 29.0* 29.7*  MCV 77.0* 77.2* 77.7* 75.7* 75.4*  PLT 588* 536* 466* 500* 511*   Cardiac Enzymes: No results for input(s): CKTOTAL, CKMB, CKMBINDEX, TROPONINI in the last 168 hours. BNP: Invalid input(s): POCBNP CBG: No results for input(s): GLUCAP in the last 168 hours. D-Dimer No results for input(s): DDIMER in the last 72 hours. Hgb A1c No results for input(s): HGBA1C in the last 72 hours. Lipid Profile No results for input(s): CHOL, HDL, LDLCALC, TRIG, CHOLHDL, LDLDIRECT in the last 72 hours. Thyroid function studies No results for input(s): TSH, T4TOTAL, T3FREE, THYROIDAB in the last 72 hours.  Invalid input(s): FREET3 Anemia work up No results for input(s): VITAMINB12, FOLATE, FERRITIN, TIBC, IRON, RETICCTPCT in the last 72 hours. Urinalysis    Component Value Date/Time   COLORURINE ORANGE (A) 01/10/2021 1829   APPEARANCEUR CLEAR 01/10/2021 1829   LABSPEC  01/10/2021 1829    TEST  NOT REPORTED DUE TO COLOR INTERFERENCE OF URINE PIGMENT   PHURINE  01/10/2021 1829    TEST NOT REPORTED DUE TO COLOR INTERFERENCE OF URINE PIGMENT   GLUCOSEU (A) 01/10/2021 1829    TEST NOT REPORTED DUE TO COLOR INTERFERENCE OF URINE PIGMENT   HGBUR (A) 01/10/2021 1829    TEST NOT REPORTED DUE TO COLOR INTERFERENCE OF URINE PIGMENT   BILIRUBINUR (A) 01/10/2021 1829    TEST NOT REPORTED DUE TO COLOR INTERFERENCE OF URINE PIGMENT   KETONESUR (A) 01/10/2021 1829    TEST NOT REPORTED DUE TO COLOR INTERFERENCE OF URINE PIGMENT   PROTEINUR (A) 01/10/2021 1829    TEST NOT REPORTED DUE TO COLOR INTERFERENCE OF URINE PIGMENT   NITRITE (A) 01/10/2021 1829    TEST NOT REPORTED DUE TO COLOR INTERFERENCE OF URINE PIGMENT   LEUKOCYTESUR (A) 01/10/2021 1829    TEST NOT REPORTED DUE TO COLOR INTERFERENCE OF URINE PIGMENT   Sepsis Labs Invalid input(s): PROCALCITONIN,  WBC,  LACTICIDVEN Microbiology Recent Results (from the past 240 hour(s))  Resp Panel by RT-PCR (Flu A&B, Covid) Nasopharyngeal Swab     Status: None   Collection Time: 01/10/21 11:55 PM   Specimen: Nasopharyngeal Swab; Nasopharyngeal(NP) swabs in vial transport medium  Result Value Ref Range Status   SARS Coronavirus 2 by RT PCR NEGATIVE NEGATIVE Final    Comment: (NOTE) SARS-CoV-2 target nucleic acids are NOT DETECTED.  The SARS-CoV-2 RNA is generally detectable in upper respiratory specimens during the acute phase of infection. The lowest concentration of SARS-CoV-2 viral copies this assay can detect is 138 copies/mL. A negative result does not preclude SARS-Cov-2 infection and should not be used as the sole basis for treatment or other patient management decisions. A negative result may occur with  improper specimen collection/handling, submission of specimen other than nasopharyngeal swab, presence of viral mutation(s) within the areas targeted by this assay, and inadequate number of viral copies(<138 copies/mL). A negative  result must be combined with  clinical observations, patient history, and epidemiological information. The expected result is Negative.  Fact Sheet for Patients:  EntrepreneurPulse.com.au  Fact Sheet for Healthcare Providers:  IncredibleEmployment.be  This test is no t yet approved or cleared by the [place] FDA and  has been authorized for detection and/or diagnosis of SARS-CoV-2 by FDA under an Emergency Use Authorization (EUA). This EUA will remain  in effect (meaning this test can be used) for the duration of the COVID-19 declaration under Section 564(b)(1) of the Act, 21 U.S.C.section 360bbb-3(b)(1), unless the authorization is terminated  or revoked sooner.       Influenza A by PCR NEGATIVE NEGATIVE Final   Influenza B by PCR NEGATIVE NEGATIVE Final    Comment: (NOTE) The Xpert Xpress SARS-CoV-2/FLU/RSV plus assay is intended as an aid in the diagnosis of influenza from Nasopharyngeal swab specimens and should not be used as a sole basis for treatment. Nasal washings and aspirates are unacceptable for Xpert Xpress SARS-CoV-2/FLU/RSV testing.  Fact Sheet for Patients: EntrepreneurPulse.com.au  Fact Sheet for Healthcare Providers: IncredibleEmployment.be  This test is not yet approved or cleared by the [place] FDA and has been authorized for detection and/or diagnosis of SARS-CoV-2 by FDA under an Emergency Use Authorization (EUA). This EUA will remain in effect (meaning this test can be used) for the duration of the COVID-19 declaration under Section 564(b)(1) of the Act, 21 U.S.C. section 360bbb-3(b)(1), unless the authorization is terminated or revoked.  Performed at [facility], [address], [place], [place] [address]   Culture, blood (routine x 2)     Status: None (Preliminary result)   Collection Time: 01/11/21 10:16 AM   Specimen: Left Antecubital; Blood  Result Value Ref  Range Status   Specimen Description LEFT ANTECUBITAL  Final   Special Requests   Final    BOTTLES DRAWN AEROBIC AND ANAEROBIC Blood Culture adequate volume   Culture   Final    NO GROWTH 2 DAYS Performed at [facility], [address], [place], [place] [address]    Report Status PENDING  Incomplete  Culture, blood (routine x 2)     Status: None (Preliminary result)   Collection Time: 01/11/21 10:18 AM   Specimen: BLOOD LEFT HAND  Result Value Ref Range Status   Specimen Description BLOOD LEFT HAND  Final   Special Requests   Final    BOTTLES DRAWN AEROBIC AND ANAEROBIC Blood Culture results may not be optimal due to an inadequate volume of blood received in culture bottles   Culture   Final    NO GROWTH 2 DAYS Performed at [facility], [address] [place], [place] [address]    Report Status PENDING  Incomplete  Gastrointestinal Panel by PCR , Stool     Status: None   Collection Time: 01/12/21  2:07 PM   Specimen: Stool  Result Value Ref Range Status   Campylobacter species NOT DETECTED NOT DETECTED Final   Plesimonas shigelloides NOT DETECTED NOT DETECTED Final   Salmonella species NOT DETECTED NOT DETECTED Final   Yersinia enterocolitica NOT DETECTED NOT DETECTED Final   Vibrio species NOT DETECTED NOT DETECTED Final   Vibrio cholerae NOT DETECTED NOT DETECTED Final   Enteroaggregative E coli (EAEC) NOT DETECTED NOT DETECTED Final   Enteropathogenic E coli (EPEC) NOT DETECTED NOT DETECTED Final   Enterotoxigenic E coli (ETEC) NOT DETECTED NOT DETECTED Final   Shiga like toxin producing E coli (STEC) NOT DETECTED NOT DETECTED Final   Shigella/Enteroinvasive E coli (EIEC)  NOT DETECTED NOT DETECTED Final   Cryptosporidium NOT DETECTED NOT DETECTED Final   Cyclospora cayetanensis NOT DETECTED NOT DETECTED Final   Entamoeba histolytica NOT DETECTED NOT DETECTED Final   Giardia lamblia NOT DETECTED NOT DETECTED Final   Adenovirus F40/41 NOT DETECTED NOT DETECTED Final    Astrovirus NOT DETECTED NOT DETECTED Final   Norovirus GI/GII NOT DETECTED NOT DETECTED Final   Rotavirus A NOT DETECTED NOT DETECTED Final   Sapovirus (I, II, IV, and V) NOT DETECTED NOT DETECTED Final    Comment: Performed at [facility], [place], [place] [address]  C Difficile Quick Screen w PCR reflex     Status: None   Collection Time: 01/12/21  2:08 PM   Specimen: Stool  Result Value Ref Range Status   C Diff antigen NEGATIVE NEGATIVE Final   C Diff toxin NEGATIVE NEGATIVE Final   C Diff interpretation No C. difficile detected.  Final    Comment: Performed at [facility], [address] [place], [place] [address]     Time coordinating discharge: 35 minutes  SIGNED:   Cordelia Poche, MD [facility] 01/14/2021, 11:04 AM

## 2021-01-16 LAB — QUANTIFERON-TB GOLD PLUS (RQFGPL)
QuantiFERON Mitogen Value: 0.44 IU/mL
QuantiFERON Nil Value: 0 IU/mL
QuantiFERON TB1 Ag Value: 0 IU/mL
QuantiFERON TB2 Ag Value: 0 IU/mL

## 2021-01-16 LAB — QUANTIFERON-TB GOLD PLUS: QuantiFERON-TB Gold Plus: UNDETERMINED — AB

## 2021-01-16 LAB — CULTURE, BLOOD (ROUTINE X 2)
Culture: NO GROWTH
Culture: NO GROWTH
Special Requests: ADEQUATE

## 2021-01-19 LAB — THIOPURINE METHYLTRANSFERASE (TPMT), RBC: TPMT Activity:: 19.5 Units/mL RBC

## 2021-02-02 NOTE — H&P (View-Only) (Signed)
Referring Provider: [facility] Primary Care Physician:  [facility] Primary GI Physician: Dr. Abbey Chatters  Chief Complaint  Patient presents with   hospital f/u    Doing much better, finished antibiotics    HPI:   Diana Fox is a 50 y.o. female presenting today for hospital follow-up.  Patient was recently admitted in early December with abdominal pain and CT findings of colitis with diffusely thickened 10 cm segment of distal sigmoid colon and suspected small bowel fistulization, low-density structure medial to mid disease segment, suspicious for necrotic lymph node or contained perforation/abscess, mildly dilated mid to lower abdominal small bowel segments without obvious transition point, unable to exclude low-grade SBO in light of inflammatory process or small bowel enteritis. Sed rate elevated at 64, CRP also elevated at 33.5.  She denied rectal bleeding or melena.   She reported 2 to 3 months of postprandial soft loose stools and recently had a bout of abdominal pain without diarrhea.  C. difficile and GI pathogen panel were negative.  General surgery evaluated patient and did not recommend surgical intervention.   She was treated with empiric antibiotics with clinical improvement.  White blood cell count peaked at 19.8 during admission, down to 14.8 on day of discharge (12/8).  Overall findings were concerning for Crohn's disease, recommended diagnostic colonoscopy in the outpatient setting once over acute illness. She was discharged on Augmentin TID x 10 days. She did have prebiologic labs completed.  QuantiFERON TB Gold was indeterminant as the mitogen control gave a low positive response which could occur due to suboptimal preanalytic handling.  Hep B surface antigen negative, TPMT WNL.  During her admission, she was also found to have anemia with low iron and iron saturation, ferritin within normal limits.  Hemoglobin 11.2 on admission, down to 9.3 on day of  discharge with no overt GI bleeding.  Due to IDA, would also need to consider EGD as outpatient.    Today: Feeling very well today. She completed her course of antibiotics. Denies abdominal pain, diarrhea, BRBPR, melena.  States she is eating much better.  Denies nausea, vomiting.  Denies any upper GI symptoms such as GERD, heartburn, or dysphagia.    She denies any chronic history of diarrhea or abdominal pain prior to hospitalization.  No family history of IBD.  No current NSAID use.  Previously used Aleve a few times for abdominal pain, but no chronic NSAID use previously.  She has not started iron supplementation.  Had blood work updated at [facility] yesterday.  Admits to history of menorrhagia, started Depo shots about a year ago and is now on medroxyprogesterone orally.  Reports she now has moderate flow with periods lasting 3-5 days.     Past Medical History:  Diagnosis Date   Anemia    Back pain    Hypertension     Past Surgical History:  Procedure Laterality Date   CHOLECYSTECTOMY      Current Outpatient Medications  Medication Sig Dispense Refill   Cholecalciferol 50 MCG (2000 UT) TABS Take by mouth daily.     CLENPIQ 10-3.5-12 MG-GM -GM/160ML SOLN Take 1 kit by mouth once for 1 dose. 320 mL 0   feeding supplement (ENSURE ENLIVE / ENSURE PLUS) LIQD Take 237 mLs by mouth 2 (two) times daily between meals. 237 mL 60   fexofenadine (ALLEGRA) 180 MG tablet Take 1 tablet (180 mg total) by mouth daily. 30 tablet 0   fluticasone (FLONASE) 50 MCG/ACT nasal spray Place  2 sprays into both nostrils daily. 16 g 12   Iron, Ferrous Sulfate, 325 (65 Fe) MG TABS Take 325 mg by mouth daily. 30 tablet 3   medroxyPROGESTERone (PROVERA) 5 MG tablet Take 5 mg by mouth daily. Not sure if taking 5 or 10 mg.     Multiple Vitamin (MULTIVITAMIN) tablet Take 1 tablet by mouth daily.     nebivolol (BYSTOLIC) 10 MG tablet Take 1 tablet (10 mg total) by mouth daily. 30 tablet 1   potassium chloride SA  (KLOR-CON M) 20 MEQ tablet Take 1 tablet (20 mEq total) by mouth daily for 3 days. (Patient not taking: Reported on 02/04/2021) 3 tablet 0   No current facility-administered medications for this visit.    Allergies as of 02/04/2021 - Review Complete 02/04/2021  Allergen Reaction Noted   Azithromycin Hives 01/12/2021   Motrin [ibuprofen] Itching 10/14/2014   Lisinopril  09/02/2013   Sudafed [pseudoephedrine hcl] Other (See Comments) 02/04/2015    Family History  Problem Relation Age of Onset   Hypertension Mother    Hypertension Father    Colon cancer Neg Hx    Colon polyps Neg Hx    Inflammatory bowel disease Neg Hx     Social History   Socioeconomic History   Marital status: Married    Spouse name: Not on file   Number of children: Not on file   Years of education: Not on file   Highest education level: Not on file  Occupational History   Not on file  Tobacco Use   Smoking status: Never   Smokeless tobacco: Never  Vaping Use   Vaping Use: Never used  Substance and Sexual Activity   Alcohol use: No   Drug use: No   Sexual activity: Not on file  Other Topics Concern   Not on file  Social History Narrative   Not on file   Social Determinants of Health   Financial Resource Strain: Not on file  Food Insecurity: Not on file  Transportation Needs: Not on file  Physical Activity: Not on file  Stress: Not on file  Social Connections: Not on file    Review of Systems: Gen: Denies fever, chills, cold or flulike symptoms, presyncope, syncope, weakness. CV: Denies chest pain, palpitations. Resp: Denies dyspnea or cough. GI: See HPI Derm: Denies rash Heme: See HPI  Physical Exam: BP 135/88    Pulse 85    Temp (!) 97.3 F (36.3 C) (Temporal)    Ht $R'5\' 3"'NF$  (1.6 m)    Wt 165 lb (74.8 kg)    LMP 02/02/2021 (Exact Date)    BMI 29.23 kg/m  General:   Alert and oriented. No distress noted. Pleasant and cooperative.  Head:  Normocephalic and atraumatic. Eyes:   Conjuctiva clear without scleral icterus. Heart:  S1, S2 present without murmurs appreciated. Lungs:  Clear to auscultation bilaterally. No wheezes, rales, or rhonchi. No distress.  Abdomen:  +BS, soft, non-tender and non-distended. No rebound or guarding. No HSM or masses noted. Msk:  Symmetrical without gross deformities. Normal posture. Extremities:  Without edema. Neurologic:  Alert and  oriented x4 Psych:  Normal mood and affect.    Assessment:  50 year old female presenting today for hospital follow-up of colitis and IDA.  She was hospitalized in early December with abdominal pain, CT findings of colitis involving the sigmoid colon, suspected small bowel fistulization, and possible necrotic lymph node versus contained perforation/abscess medial to mid disease sigmoid segment.  Found to have elevated sed  rate and CRP as well as iron deficiency anemia.  She had a couple days of diarrhea prior to admission and developed some diarrhea while hospitalized as well, but denied any significant history of diarrhea and no overt GI bleeding. C. difficile and GI pathogen panel were negative and she was in empirically treated with antibiotics with clinical improvement.  She was discharged on 10 days of Augmentin with plans to pursue outpatient endoscopy.    Colitis: Etiology unknown, CT findings, elevated inflammatory markers, and IDA concerning for underlying IBD, specifically Crohn's disease.  However, patient denies any chronic history of diarrhea or abdominal pain prior to this.  No family history of IBD or colon cancer.  She has completed her course of antibiotics and is clinically doing very well.  Abdominal pain has completely resolved, no return of diarrhea, and continues to deny any overt GI bleeding.  We will plan for colonoscopy to follow-up on abnormal CT findings to rule out IBD.  Notably, prior to hospital discharge, she completed labs needed prior to starting a biologic. QuantiFERON TB Gold was  indeterminant as the mitogen control gave a low positive response which could occur due to suboptimal preanalytic handling.  Hep B surface antigen negative, TPMT WNL.    IDA: Anemia is chronic and dates back at least to 2020.  Hemoglobin was 11.2 on day of hospital admission (12/4), down to 9.3 on day of discharge (12/8).  Iron saturation 4%, iron 10, ferritin within normal limits at 108 (possible acute phase reactant).  No recent baseline hemoglobin to compare to.  Prior to hospitalization, most recent hemoglobin via care everywhere was 9.2 in March 2020.  She denies any history of overt GI bleeding.  No significant GI symptoms at this time though recently admitted with colitis and concern for underlying Crohn's disease discussed above.  No history of chronic NSAID use.  She does have history of menorrhagia which has been under pretty good control for the last year via Depo-Provera, now transitioned to oral medroxyprogesterone.    Menorrhagia may very well be contributing to IDA; however, we need to rule GI sources especially in the setting of recent colitis and concern for possible underlying Crohn's disease. We will plan for colonoscopy and EGD in the near future to rule out IBD, malignancy, polyps, gastritis, duodenitis, PUD, H. pylori, celiac disease, AMVs.  We will also start her on iron and request recent blood work completed with PCP yesterday.   Plan:  Proceed with colonoscopy and EGD with propofol with Dr. Abbey Chatters in the near future. The risks, benefits, and alternatives have been discussed with the patient in detail. The patient states understanding and desires to proceed. ASA 2 Hold iron x7 days prior to procedures. Start ferrous sulfate 325 mg daily.  Prescription sent to pharmacy. Request recent blood work completed with PCP. Follow-up after procedures.    Aliene Altes, PA-C [facility] 02/04/2021

## 2021-02-02 NOTE — Progress Notes (Signed)
Referring Provider: [facility] Primary Care Physician:  [facility] Primary GI Physician: Dr. Abbey Chatters  Chief Complaint  Patient presents with   hospital f/u    Doing much better, finished antibiotics    HPI:   Diana Fox is a 50 y.o. female presenting today for hospital follow-up.  Patient was recently admitted in early December with abdominal pain and CT findings of colitis with diffusely thickened 10 cm segment of distal sigmoid colon and suspected small bowel fistulization, low-density structure medial to mid disease segment, suspicious for necrotic lymph node or contained perforation/abscess, mildly dilated mid to lower abdominal small bowel segments without obvious transition point, unable to exclude low-grade SBO in light of inflammatory process or small bowel enteritis. Sed rate elevated at 64, CRP also elevated at 33.5.  She denied rectal bleeding or melena.   She reported 2 to 3 months of postprandial soft loose stools and recently had a bout of abdominal pain without diarrhea.  C. difficile and GI pathogen panel were negative.  General surgery evaluated patient and did not recommend surgical intervention.   She was treated with empiric antibiotics with clinical improvement.  White blood cell count peaked at 19.8 during admission, down to 14.8 on day of discharge (12/8).  Overall findings were concerning for Crohn's disease, recommended diagnostic colonoscopy in the outpatient setting once over acute illness. She was discharged on Augmentin TID x 10 days. She did have prebiologic labs completed.  QuantiFERON TB Gold was indeterminant as the mitogen control gave a low positive response which could occur due to suboptimal preanalytic handling.  Hep B surface antigen negative, TPMT WNL.  During her admission, she was also found to have anemia with low iron and iron saturation, ferritin within normal limits.  Hemoglobin 11.2 on admission, down to 9.3 on day of  discharge with no overt GI bleeding.  Due to IDA, would also need to consider EGD as outpatient.    Today: Feeling very well today. She completed her course of antibiotics. Denies abdominal pain, diarrhea, BRBPR, melena.  States she is eating much better.  Denies nausea, vomiting.  Denies any upper GI symptoms such as GERD, heartburn, or dysphagia.    She denies any chronic history of diarrhea or abdominal pain prior to hospitalization.  No family history of IBD.  No current NSAID use.  Previously used Aleve a few times for abdominal pain, but no chronic NSAID use previously.  She has not started iron supplementation.  Had blood work updated at [facility] yesterday.  Admits to history of menorrhagia, started Depo shots about a year ago and is now on medroxyprogesterone orally.  Reports she now has moderate flow with periods lasting 3-5 days.     Past Medical History:  Diagnosis Date   Anemia    Back pain    Hypertension     Past Surgical History:  Procedure Laterality Date   CHOLECYSTECTOMY      Current Outpatient Medications  Medication Sig Dispense Refill   Cholecalciferol 50 MCG (2000 UT) TABS Take by mouth daily.     CLENPIQ 10-3.5-12 MG-GM -GM/160ML SOLN Take 1 kit by mouth once for 1 dose. 320 mL 0   feeding supplement (ENSURE ENLIVE / ENSURE PLUS) LIQD Take 237 mLs by mouth 2 (two) times daily between meals. 237 mL 60   fexofenadine (ALLEGRA) 180 MG tablet Take 1 tablet (180 mg total) by mouth daily. 30 tablet 0   fluticasone (FLONASE) 50 MCG/ACT nasal spray Place  2 sprays into both nostrils daily. 16 g 12   Iron, Ferrous Sulfate, 325 (65 Fe) MG TABS Take 325 mg by mouth daily. 30 tablet 3   medroxyPROGESTERone (PROVERA) 5 MG tablet Take 5 mg by mouth daily. Not sure if taking 5 or 10 mg.     Multiple Vitamin (MULTIVITAMIN) tablet Take 1 tablet by mouth daily.     nebivolol (BYSTOLIC) 10 MG tablet Take 1 tablet (10 mg total) by mouth daily. 30 tablet 1   potassium chloride SA  (KLOR-CON M) 20 MEQ tablet Take 1 tablet (20 mEq total) by mouth daily for 3 days. (Patient not taking: Reported on 02/04/2021) 3 tablet 0   No current facility-administered medications for this visit.    Allergies as of 02/04/2021 - Review Complete 02/04/2021  Allergen Reaction Noted   Azithromycin Hives 01/12/2021   Motrin [ibuprofen] Itching 10/14/2014   Lisinopril  09/02/2013   Sudafed [pseudoephedrine hcl] Other (See Comments) 02/04/2015    Family History  Problem Relation Age of Onset   Hypertension Mother    Hypertension Father    Colon cancer Neg Hx    Colon polyps Neg Hx    Inflammatory bowel disease Neg Hx     Social History   Socioeconomic History   Marital status: Married    Spouse name: Not on file   Number of children: Not on file   Years of education: Not on file   Highest education level: Not on file  Occupational History   Not on file  Tobacco Use   Smoking status: Never   Smokeless tobacco: Never  Vaping Use   Vaping Use: Never used  Substance and Sexual Activity   Alcohol use: No   Drug use: No   Sexual activity: Not on file  Other Topics Concern   Not on file  Social History Narrative   Not on file   Social Determinants of Health   Financial Resource Strain: Not on file  Food Insecurity: Not on file  Transportation Needs: Not on file  Physical Activity: Not on file  Stress: Not on file  Social Connections: Not on file    Review of Systems: Gen: Denies fever, chills, cold or flulike symptoms, presyncope, syncope, weakness. CV: Denies chest pain, palpitations. Resp: Denies dyspnea or cough. GI: See HPI Derm: Denies rash Heme: See HPI  Physical Exam: BP 135/88    Pulse 85    Temp (!) 97.3 F (36.3 C) (Temporal)    Ht $R'5\' 3"'ou$  (1.6 m)    Wt 165 lb (74.8 kg)    LMP 02/02/2021 (Exact Date)    BMI 29.23 kg/m  General:   Alert and oriented. No distress noted. Pleasant and cooperative.  Head:  Normocephalic and atraumatic. Eyes:   Conjuctiva clear without scleral icterus. Heart:  S1, S2 present without murmurs appreciated. Lungs:  Clear to auscultation bilaterally. No wheezes, rales, or rhonchi. No distress.  Abdomen:  +BS, soft, non-tender and non-distended. No rebound or guarding. No HSM or masses noted. Msk:  Symmetrical without gross deformities. Normal posture. Extremities:  Without edema. Neurologic:  Alert and  oriented x4 Psych:  Normal mood and affect.    Assessment:  50 year old female presenting today for hospital follow-up of colitis and IDA.  She was hospitalized in early December with abdominal pain, CT findings of colitis involving the sigmoid colon, suspected small bowel fistulization, and possible necrotic lymph node versus contained perforation/abscess medial to mid disease sigmoid segment.  Found to have elevated sed  rate and CRP as well as iron deficiency anemia.  She had a couple days of diarrhea prior to admission and developed some diarrhea while hospitalized as well, but denied any significant history of diarrhea and no overt GI bleeding. C. difficile and GI pathogen panel were negative and she was in empirically treated with antibiotics with clinical improvement.  She was discharged on 10 days of Augmentin with plans to pursue outpatient endoscopy.    Colitis: Etiology unknown, CT findings, elevated inflammatory markers, and IDA concerning for underlying IBD, specifically Crohn's disease.  However, patient denies any chronic history of diarrhea or abdominal pain prior to this.  No family history of IBD or colon cancer.  She has completed her course of antibiotics and is clinically doing very well.  Abdominal pain has completely resolved, no return of diarrhea, and continues to deny any overt GI bleeding.  We will plan for colonoscopy to follow-up on abnormal CT findings to rule out IBD.  Notably, prior to hospital discharge, she completed labs needed prior to starting a biologic. QuantiFERON TB Gold was  indeterminant as the mitogen control gave a low positive response which could occur due to suboptimal preanalytic handling.  Hep B surface antigen negative, TPMT WNL.    IDA: Anemia is chronic and dates back at least to 2020.  Hemoglobin was 11.2 on day of hospital admission (12/4), down to 9.3 on day of discharge (12/8).  Iron saturation 4%, iron 10, ferritin within normal limits at 108 (possible acute phase reactant).  No recent baseline hemoglobin to compare to.  Prior to hospitalization, most recent hemoglobin via care everywhere was 9.2 in March 2020.  She denies any history of overt GI bleeding.  No significant GI symptoms at this time though recently admitted with colitis and concern for underlying Crohn's disease discussed above.  No history of chronic NSAID use.  She does have history of menorrhagia which has been under pretty good control for the last year via Depo-Provera, now transitioned to oral medroxyprogesterone.    Menorrhagia may very well be contributing to IDA; however, we need to rule GI sources especially in the setting of recent colitis and concern for possible underlying Crohn's disease. We will plan for colonoscopy and EGD in the near future to rule out IBD, malignancy, polyps, gastritis, duodenitis, PUD, H. pylori, celiac disease, AMVs.  We will also start her on iron and request recent blood work completed with PCP yesterday.   Plan:  Proceed with colonoscopy and EGD with propofol with Dr. Abbey Chatters in the near future. The risks, benefits, and alternatives have been discussed with the patient in detail. The patient states understanding and desires to proceed. ASA 2 Hold iron x7 days prior to procedures. Start ferrous sulfate 325 mg daily.  Prescription sent to pharmacy. Request recent blood work completed with PCP. Follow-up after procedures.    Aliene Altes, PA-C [facility] 02/04/2021

## 2021-02-04 ENCOUNTER — Encounter: Payer: Self-pay | Admitting: Gastroenterology

## 2021-02-04 ENCOUNTER — Ambulatory Visit: Payer: Medicaid Other | Admitting: Gastroenterology

## 2021-02-04 ENCOUNTER — Encounter: Payer: Self-pay | Admitting: *Deleted

## 2021-02-04 ENCOUNTER — Other Ambulatory Visit: Payer: Self-pay

## 2021-02-04 VITALS — BP 135/88 | HR 85 | Temp 97.3°F | Ht 63.0 in | Wt 165.0 lb

## 2021-02-04 DIAGNOSIS — D509 Iron deficiency anemia, unspecified: Secondary | ICD-10-CM | POA: Diagnosis not present

## 2021-02-04 DIAGNOSIS — Z8719 Personal history of other diseases of the digestive system: Secondary | ICD-10-CM

## 2021-02-04 MED ORDER — CLENPIQ 10-3.5-12 MG-GM -GM/160ML PO SOLN: 1.0000 | Freq: Once | ORAL | 0 refills | Status: AC

## 2021-02-04 MED ORDER — IRON (FERROUS SULFATE) 325 (65 FE) MG PO TABS: 325.0000 mg | ORAL_TABLET | Freq: Every day | ORAL | 3 refills | Status: DC

## 2021-02-04 NOTE — Patient Instructions (Addendum)
We will arrange for you to have a colonoscopy and upper endoscopy with Dr. Abbey Chatters in the near future.  Hold iron for 7 days prior to your procedure.   I am requesting your recent blood work from your primary care provider.   Start Ferrous Sulfate 325 mg daily. I am sending in a prescription for you.   We will follow-up with you in the office after your procedures.    It was good to see you today! I am glad you are feeling much better! Have a happy New Year!   Aliene Altes, PA-C [facility]

## 2021-02-04 NOTE — Addendum Note (Signed)
Addended by: Cheron Every on: 02/04/2021 09:18 AM   Modules accepted: Orders

## 2021-02-09 ENCOUNTER — Telehealth: Payer: Self-pay | Admitting: *Deleted

## 2021-02-09 NOTE — Telephone Encounter (Signed)
PA faxed to [email or web address] with clinicals.

## 2021-02-10 NOTE — Telephone Encounter (Signed)
Received fax no PA required 

## 2021-02-15 ENCOUNTER — Telehealth: Payer: Self-pay | Admitting: Gastroenterology

## 2021-02-15 NOTE — Telephone Encounter (Signed)
Diana Fox, can you request recent labs from PCP? I believe we requested at the time of her recent OV, but I haven't received anything.

## 2021-02-17 NOTE — Telephone Encounter (Signed)
Received and reviewed labs from PCP collected 02/03/2021.  CBC: WBC 5.1, hemoglobin 10.0 (L), hematocrit 32.4 (L), MCV 77 (L), MCH 23.8 (L), MCHC 30.9 (L), platelets 461 (H).  Courtney: Please let patient know I have received labs from PCP completed 12/28.  Her hemoglobin is low at 10.0, but this is improved since her hospital discharge (9.3).  Recommendations: Continue iron daily.  Remember to hold for 7 days prior to procedures. Proceed with EGD and colonoscopy as scheduled.

## 2021-02-17 NOTE — Telephone Encounter (Signed)
Spoke to pt informed her of results and recommendations. Pt voiced understanding. Reminder her of up coming procedures.

## 2021-02-17 NOTE — Telephone Encounter (Signed)
See previous note

## 2021-02-23 ENCOUNTER — Other Ambulatory Visit: Payer: Self-pay | Admitting: Gastroenterology

## 2021-02-23 DIAGNOSIS — D509 Iron deficiency anemia, unspecified: Secondary | ICD-10-CM

## 2021-03-01 ENCOUNTER — Telehealth: Payer: Self-pay | Admitting: Internal Medicine

## 2021-03-01 ENCOUNTER — Encounter: Payer: Self-pay | Admitting: Internal Medicine

## 2021-03-01 MED ORDER — CLENPIQ 10-3.5-12 MG-GM -GM/160ML PO SOLN: 1.0000 | Freq: Once | ORAL | 0 refills | Status: AC

## 2021-03-01 NOTE — Telephone Encounter (Addendum)
Rx was sent 02/04/21 with note back "E-Prescribing Status: Receipt confirmed by pharmacy (02/04/2021  9:13 AM EST)"   I have resent Rx just in case

## 2021-03-01 NOTE — Telephone Encounter (Signed)
PLEASE MAKE SURE PATIENT PREP HAS BEEN SENT IN , SHE IS SUPPOSED TO START PREP AT 5PM AND SHE HAS NOT PICKED IT UP YET

## 2021-03-01 NOTE — Addendum Note (Signed)
Addended by: Cheron Every on: 03/01/2021 04:25 PM   Modules accepted: Orders

## 2021-03-02 ENCOUNTER — Other Ambulatory Visit: Payer: Self-pay

## 2021-03-02 ENCOUNTER — Ambulatory Visit (HOSPITAL_COMMUNITY): Payer: Medicaid Other | Admitting: Certified Registered"

## 2021-03-02 ENCOUNTER — Telehealth: Payer: Self-pay | Admitting: Internal Medicine

## 2021-03-02 ENCOUNTER — Ambulatory Visit (HOSPITAL_COMMUNITY)
Admission: RE | Admit: 2021-03-02 | Discharge: 2021-03-02 | Disposition: A | Discharge status: Home / Self Care | Payer: Medicaid Other | Attending: Internal Medicine | Admitting: Internal Medicine

## 2021-03-02 ENCOUNTER — Encounter (HOSPITAL_COMMUNITY): Payer: Self-pay

## 2021-03-02 ENCOUNTER — Encounter (HOSPITAL_COMMUNITY)
Admission: RE | Disposition: A | Discharge status: Home / Self Care | Payer: Self-pay | Source: Home / Self Care | Attending: Internal Medicine

## 2021-03-02 DIAGNOSIS — D509 Iron deficiency anemia, unspecified: Secondary | ICD-10-CM | POA: Diagnosis not present

## 2021-03-02 DIAGNOSIS — K2970 Gastritis, unspecified, without bleeding: Secondary | ICD-10-CM | POA: Diagnosis not present

## 2021-03-02 DIAGNOSIS — R933 Abnormal findings on diagnostic imaging of other parts of digestive tract: Secondary | ICD-10-CM | POA: Insufficient documentation

## 2021-03-02 DIAGNOSIS — K56699 Other intestinal obstruction unspecified as to partial versus complete obstruction: Secondary | ICD-10-CM | POA: Diagnosis not present

## 2021-03-02 DIAGNOSIS — Z79899 Other long term (current) drug therapy: Secondary | ICD-10-CM | POA: Insufficient documentation

## 2021-03-02 DIAGNOSIS — K319 Disease of stomach and duodenum, unspecified: Secondary | ICD-10-CM | POA: Diagnosis not present

## 2021-03-02 DIAGNOSIS — Z793 Long term (current) use of hormonal contraceptives: Secondary | ICD-10-CM | POA: Diagnosis not present

## 2021-03-02 DIAGNOSIS — C187 Malignant neoplasm of sigmoid colon: Secondary | ICD-10-CM | POA: Insufficient documentation

## 2021-03-02 DIAGNOSIS — K648 Other hemorrhoids: Secondary | ICD-10-CM | POA: Diagnosis not present

## 2021-03-02 DIAGNOSIS — K529 Noninfective gastroenteritis and colitis, unspecified: Secondary | ICD-10-CM | POA: Diagnosis not present

## 2021-03-02 DIAGNOSIS — I10 Essential (primary) hypertension: Secondary | ICD-10-CM | POA: Insufficient documentation

## 2021-03-02 DIAGNOSIS — R948 Abnormal results of function studies of other organs and systems: Secondary | ICD-10-CM | POA: Diagnosis present

## 2021-03-02 HISTORY — PX: SCLEROTHERAPY: SHX6841

## 2021-03-02 HISTORY — PX: SIGMOIDOSCOPY: SHX6686

## 2021-03-02 HISTORY — PX: ESOPHAGOGASTRODUODENOSCOPY (EGD) WITH PROPOFOL: SHX5813

## 2021-03-02 HISTORY — PX: BIOPSY: SHX5522

## 2021-03-02 LAB — PREGNANCY, URINE: Preg Test, Ur: NEGATIVE

## 2021-03-02 SURGERY — ESOPHAGOGASTRODUODENOSCOPY (EGD) WITH PROPOFOL
Anesthesia: General

## 2021-03-02 MED ORDER — PROPOFOL 500 MG/50ML IV EMUL
INTRAVENOUS | Status: AC
  Filled 2021-03-02: qty 50

## 2021-03-02 MED ORDER — PROPOFOL 10 MG/ML IV BOLUS
INTRAVENOUS | Status: AC
  Filled 2021-03-02: qty 20

## 2021-03-02 MED ORDER — LACTATED RINGERS IV SOLN: INTRAVENOUS | Status: DC | PRN

## 2021-03-02 MED ORDER — LACTATED RINGERS IV SOLN: INTRAVENOUS | Status: DC

## 2021-03-02 MED ORDER — PROPOFOL 10 MG/ML IV BOLUS
INTRAVENOUS | Status: DC | PRN
  Administered 2021-03-02: 20 mg via INTRAVENOUS
  Administered 2021-03-02: 80 mg via INTRAVENOUS

## 2021-03-02 MED ORDER — LIDOCAINE 2% (20 MG/ML) 5 ML SYRINGE
INTRAMUSCULAR | Status: DC | PRN
  Administered 2021-03-02: 50 mg via INTRAVENOUS

## 2021-03-02 MED ORDER — SPOT INK MARKER SYRINGE KIT
PACK | SUBMUCOSAL | Status: AC
  Filled 2021-03-02: qty 5

## 2021-03-02 MED ORDER — PROPOFOL 500 MG/50ML IV EMUL
INTRAVENOUS | Status: DC | PRN
  Administered 2021-03-02: 150 ug/kg/min via INTRAVENOUS

## 2021-03-02 NOTE — Discharge Instructions (Addendum)
EGD Discharge instructions Please read the instructions outlined below and refer to this sheet in the next few weeks. These discharge instructions provide you with general information on caring for yourself after you leave the hospital. Your doctor may also give you specific instructions. While your treatment has been planned according to the most current medical practices available, unavoidable complications occasionally occur. If you have any problems or questions after discharge, please call your doctor. ACTIVITY You may resume your regular activity but move at a slower pace for the next 24 hours.  Take frequent rest periods for the next 24 hours.  Walking will help expel (get rid of) the air and reduce the bloated feeling in your abdomen.  No driving for 24 hours (because of the anesthesia (medicine) used during the test).  You may shower.  Do not sign any important legal documents or operate any machinery for 24 hours (because of the anesthesia used during the test).  NUTRITION Drink plenty of fluids.  You may resume your normal diet.  Begin with a light meal and progress to your normal diet.  Avoid alcoholic beverages for 24 hours or as instructed by your caregiver.  MEDICATIONS You may resume your normal medications unless your caregiver tells you otherwise.  WHAT YOU CAN EXPECT TODAY You may experience abdominal discomfort such as a feeling of fullness or gas pains.  FOLLOW-UP Your doctor will discuss the results of your test with you.  SEEK IMMEDIATE MEDICAL ATTENTION IF ANY OF THE FOLLOWING OCCUR: Excessive nausea (feeling sick to your stomach) and/or vomiting.  Severe abdominal pain and distention (swelling).  Trouble swallowing.  Temperature over 101 F (37.8 C).  Rectal bleeding or vomiting of blood.    Colonoscopy Discharge Instructions  Read the instructions outlined below and refer to this sheet in the next few weeks. These discharge instructions provide you with  general information on caring for yourself after you leave the hospital. Your doctor may also give you specific instructions. While your treatment has been planned according to the most current medical practices available, unavoidable complications occasionally occur.   ACTIVITY You may resume your regular activity, but move at a slower pace for the next 24 hours.  Take frequent rest periods for the next 24 hours.  Walking will help get rid of the air and reduce the bloated feeling in your belly (abdomen).  No driving for 24 hours (because of the medicine (anesthesia) used during the test).   Do not sign any important legal documents or operate any machinery for 24 hours (because of the anesthesia used during the test).  NUTRITION Drink plenty of fluids.  You may resume your normal diet as instructed by your doctor.  Begin with a light meal and progress to your normal diet. Heavy or fried foods are harder to digest and may make you feel sick to your stomach (nauseated).  Avoid alcoholic beverages for 24 hours or as instructed.  MEDICATIONS You may resume your normal medications unless your doctor tells you otherwise.  WHAT YOU CAN EXPECT TODAY Some feelings of bloating in the abdomen.  Passage of more gas than usual.  Spotting of blood in your stool or on the toilet paper.  IF YOU HAD POLYPS REMOVED DURING THE COLONOSCOPY: No aspirin products for 7 days or as instructed.  No alcohol for 7 days or as instructed.  Eat a soft diet for the next 24 hours.  FINDING OUT THE RESULTS OF YOUR TEST Not all test results are available  during your visit. If your test results are not back during the visit, make an appointment with your caregiver to find out the results. Do not assume everything is normal if you have not heard from your caregiver or the medical facility. It is important for you to follow up on all of your test results.  SEEK IMMEDIATE MEDICAL ATTENTION IF: You have more than a spotting of  blood in your stool.  Your belly is swollen (abdominal distention).  You are nauseated or vomiting.  You have a temperature over 101.  You have abdominal pain or discomfort that is severe or gets worse throughout the day.   Your EGD revealed mild amount inflammation in your stomach.  I took biopsies of this to rule out infection with a bacteria called H. pylori.  Await pathology results, my office will contact you. Continue on pantoprazole daily.   Your colonoscopy revealed a very tight stricture in the end portion of your colon near your rectum.  I was unable to pass colonoscope past this area as the stricture was so severe.  I did take numerous biopsies of this area.  Likely this was due to complicated diverticulitis that has since strictured down.  Await biopsy results.  I will contact you in a few days.  Discussed case with surgery, I would recommend surgical resection of this area.  We will plan a repeat colonoscopy in 3 to 6 months afterwards.   I hope you have a great rest of your week!  Elon Alas. Abbey Chatters, D.O. Gastroenterology and Hepatology [facility]

## 2021-03-02 NOTE — Anesthesia Postprocedure Evaluation (Signed)
Anesthesia Post Note  Patient: Andrey Hoobler  Procedure(s) Performed: ESOPHAGOGASTRODUODENOSCOPY (EGD) WITH PROPOFOL BIOPSY SIGMOIDOSCOPY SCLEROTHERAPY  Patient location during evaluation: Phase II Anesthesia Type: General Level of consciousness: awake Pain management: pain level controlled Vital Signs Assessment: post-procedure vital signs reviewed and stable Respiratory status: spontaneous breathing and respiratory function stable Cardiovascular status: blood pressure returned to baseline and stable Postop Assessment: no headache and no apparent nausea or vomiting Anesthetic complications: no Comments: Late entry   No notable events documented.   Last Vitals:  Vitals:   03/02/21 0804 03/02/21 0814  BP: (!) 96/58 109/78  Resp: 20   Temp: 36.9 C   SpO2: 96%     Last Pain:  Vitals:   03/02/21 0804  TempSrc: Oral  PainSc: 0-No pain                 Louann Sjogren

## 2021-03-02 NOTE — Transfer of Care (Signed)
Immediate Anesthesia Transfer of Care Note  Patient: Diana Fox  Procedure(s) Performed: ESOPHAGOGASTRODUODENOSCOPY (EGD) WITH PROPOFOL BIOPSY SIGMOIDOSCOPY  Patient Location: Endoscopy Unit  Anesthesia Type:MAC  Level of Consciousness: sedated and patient cooperative  Airway & Oxygen Therapy: Patient Spontanous Breathing  Post-op Assessment: Report given to RN, Post -op Vital signs reviewed and stable and Patient moving all extremities  Post vital signs: Reviewed and stable  Last Vitals:  Vitals Value Taken Time  BP    Temp    Pulse    Resp    SpO2      Last Pain:  Vitals:   03/02/21 0736  TempSrc:   PainSc: 0-No pain      Patients Stated Pain Goal: 10 (29/93/71 6967)  Complications: No notable events documented.

## 2021-03-02 NOTE — Interval H&P Note (Signed)
History and Physical Interval Note:  03/02/2021 7:33 AM  Diana Fox  has presented today for surgery, with the diagnosis of IDA, hx colitis.  The various methods of treatment have been discussed with the patient and family. After consideration of risks, benefits and other options for treatment, the patient has consented to  Procedure(s) with comments: COLONOSCOPY WITH PROPOFOL (N/A) - 7:30am ESOPHAGOGASTRODUODENOSCOPY (EGD) WITH PROPOFOL (N/A) as a surgical intervention.  The patient's history has been reviewed, patient examined, no change in status, stable for surgery.  I have reviewed the patient's chart and labs.  Questions were answered to the patient's satisfaction.     Eloise Harman

## 2021-03-02 NOTE — Op Note (Signed)
[facility] Patient Name: Diana Fox Procedure Date: 03/02/2021 7:41 AM MRN: [id number] Date of Birth: 1970-06-05 Attending MD: Elon Alas. Abbey Chatters DO CSN: [id number] Age: 51 Admit Type: Outpatient Procedure:                Upper GI endoscopy Indications:              Iron deficiency anemia Providers:                Elon Alas. Abbey Chatters, DO, Charlsie Quest. Theda Sers RN, RN,                            Nelma Rothman, Technician Referring MD:              Medicines:                See the Anesthesia note for documentation of the                            administered medications Complications:            No immediate complications. Estimated Blood Loss:     Estimated blood loss was minimal. Procedure:                Pre-Anesthesia Assessment:                           - The anesthesia plan was to use monitored                            anesthesia care (MAC).                           After obtaining informed consent, the endoscope was                            passed under direct vision. Throughout the                            procedure, the patient's blood pressure, pulse, and                            oxygen saturations were monitored continuously. The                            [id number]-H190 ([id number]) scope was introduced through the                            mouth, and advanced to the second part of duodenum.                            The upper GI endoscopy was accomplished without                            difficulty. The patient tolerated the procedure                            well. Scope In:  7:41:07 AM Scope Out: 7:45:16 AM Total Procedure Duration: 0 hours 4 minutes 9 seconds  Findings:      The Z-line was regular and was found 38 cm from the incisors.      There is no endoscopic evidence of bleeding, areas of erosion,       esophagitis, ulcerations or varices in the entire esophagus.      Localized mild inflammation characterized by erythema was found in the       gastric  antrum. Biopsies were taken with a cold forceps for Helicobacter       pylori testing.      The duodenal bulb, first portion of the duodenum and second portion of       the duodenum were normal. Impression:               - Z-line regular, 38 cm from the incisors.                           - Gastritis. Biopsied.                           - Normal duodenal bulb, first portion of the                            duodenum and second portion of the duodenum. Moderate Sedation:      Per Anesthesia Care Recommendation:           - Patient has a contact number available for                            emergencies. The signs and symptoms of potential                            delayed complications were discussed with the                            patient. Return to normal activities tomorrow.                            Written discharge instructions were provided to the                            patient.                           - Resume previous diet.                           - Continue present medications.                           - Await pathology results.                           - Return to GI office in 4 months.                           PICTURES NOT WORKING DURING EGD Procedure Code(s):        ---  Professional ---                           [id number], Esophagogastroduodenoscopy, flexible,                            transoral; with biopsy, single or multiple Diagnosis Code(s):        --- Professional ---                           K29.70, Gastritis, unspecified, without bleeding                           D50.9, Iron deficiency anemia, unspecified CPT copyright 2019 American Medical Association. All rights reserved. The codes documented in this report are preliminary and upon coder review may  be revised to meet current compliance requirements. Elon Alas. Abbey Chatters, DO [place] Abbey Chatters, DO 03/02/2021 8:12:06 AM This report has been signed electronically. Number of Addenda: 0

## 2021-03-02 NOTE — Telephone Encounter (Signed)
Referral sent to Dr. Arnoldo Morale via [place].

## 2021-03-02 NOTE — Anesthesia Preprocedure Evaluation (Signed)
Anesthesia Evaluation  Patient identified by MRN, date of birth, ID band Patient awake    Reviewed: Allergy & Precautions, H&P , NPO status , Patient's Chart, lab work & pertinent test results, reviewed documented beta blocker date and time   Airway Mallampati: II  TM Distance: >3 FB Neck ROM: full    Dental no notable dental hx.    Pulmonary neg pulmonary ROS,    Pulmonary exam normal breath sounds clear to auscultation       Cardiovascular Exercise Tolerance: Good hypertension, negative cardio ROS   Rhythm:regular Rate:Normal     Neuro/Psych negative neurological ROS  negative psych ROS   GI/Hepatic negative GI ROS, Neg liver ROS,   Endo/Other  negative endocrine ROS  Renal/GU negative Renal ROS  negative genitourinary   Musculoskeletal   Abdominal   Peds  Hematology  (+) Blood dyscrasia, anemia ,   Anesthesia Other Findings   Reproductive/Obstetrics negative OB ROS                             Anesthesia Physical Anesthesia Plan  ASA: 2  Anesthesia Plan: General   Post-op Pain Management:    Induction:   PONV Risk Score and Plan: Propofol infusion  Airway Management Planned:   Additional Equipment:   Intra-op Plan:   Post-operative Plan:   Informed Consent: I have reviewed the patients History and Physical, chart, labs and discussed the procedure including the risks, benefits and alternatives for the proposed anesthesia with the patient or authorized representative who has indicated his/her understanding and acceptance.     Dental Advisory Given  Plan Discussed with: CRNA  Anesthesia Plan Comments:         Anesthesia Quick Evaluation  

## 2021-03-02 NOTE — Addendum Note (Signed)
Addended by: Hassan Rowan on: 03/02/2021 08:40 AM   Modules accepted: Orders

## 2021-03-02 NOTE — Telephone Encounter (Signed)
Good morning, just finished colonoscopy on this patient.  She has a very tight sigmoid stricture which I was unable to pass with colonoscope.  Took extensive biopsies of the area.  Discussed case with Dr. Arnoldo Morale who saw her in the hospital previously.  She will likely need colon resection of the area.  Can we refer her today to Dr. Arnoldo Morale?    Thank you

## 2021-03-02 NOTE — Op Note (Signed)
[facility] Patient Name: Diana Fox Procedure Date: 03/02/2021 7:50 AM MRN: [id number] Date of Birth: 1970/12/15 Attending MD: Elon Alas. Abbey Chatters DO CSN: [id number] Age: 51 Admit Type: Outpatient Procedure:                Colonoscopy Indications:              Abnormal CT of the GI tract Providers:                Elon Alas. Abbey Chatters, DO, Charlsie Quest. Theda Sers RN, RN,                            Nelma Rothman, Technician Referring MD:              Medicines:                See the Anesthesia note for documentation of the                            administered medications Complications:            No immediate complications. Estimated Blood Loss:     Estimated blood loss was minimal. Procedure:                Pre-Anesthesia Assessment:                           - The anesthesia plan was to use monitored                            anesthesia care (MAC).                           After obtaining informed consent, the colonoscope                            was passed under direct vision. Throughout the                            procedure, the patient's blood pressure, pulse, and                            oxygen saturations were monitored continuously. The                            [id number]-HQ190L ([id number]) scope was introduced through                            the anus with the intention of advancing to the                            cecum. The scope was advanced to the sigmoid colon                            before the procedure was aborted. Medications were  given. The colonoscopy was performed without                            difficulty. The patient tolerated the procedure                            well. The quality of the bowel preparation was                            evaluated using the BBPS [facility] Bowel Preparation                            Scale) with scores of: Left Colon = 3 (entire                            mucosa seen well with no residual  staining, small                            fragments of stool or opaque liquid). The total                            BBPS score equals 3. Scope In: 7:53:21 AM Scope Out: 8:02:10 AM Total Procedure Duration: 0 hours 8 minutes 49 seconds  Findings:      The perianal and digital rectal examinations were normal.      Non-bleeding internal hemorrhoids were found during endoscopy.      A severe stenosis was found in the sigmoid colon at approx. 17-18 cm       from the anal verge which was unable to be traversed with pediatric       colonoscope. Surrounding mucosa edematous and erythematous. Biopsies       were taken with a cold forceps for histology. Area distal was tattooed       with an injection of [place] ink. Scope was then withdrawn. Impression:               - Non-bleeding internal hemorrhoids.                           - Stricture in the sigmoid colon. Biopsied.                            Tattooed. Moderate Sedation:      Per Anesthesia Care Recommendation:           - Patient has a contact number available for                            emergencies. The signs and symptoms of potential                            delayed complications were discussed with the                            patient. Return to normal activities tomorrow.  Written discharge instructions were provided to the                            patient.                           - Resume previous diet.                           - Continue present medications.                           - Await pathology results.                           - Case discussed with Dr. Arnoldo Morale who saw patient                            in hospital previously. Will likely need resection                            of sigmoid colon. Will refer today. Repeat                            colonoscopy to be performed after resection. Procedure Code(s):        --- Professional ---                           [phone], 15,  Colonoscopy, flexible; with directed                            submucosal injection(s), any substance                           35701, 40, Colonoscopy, flexible; with biopsy,                            single or multiple Diagnosis Code(s):        --- Professional ---                           K56.699, Other intestinal obstruction unspecified                            as to partial versus complete obstruction                           K64.8, Other hemorrhoids                           R93.3, Abnormal findings on diagnostic imaging of                            other parts of digestive tract CPT copyright 2019 American Medical Association. All rights reserved. The codes documented in this report are preliminary and upon coder review may  be revised to meet current compliance requirements. Juanda Crumble  Arlee Muslim, DO Elon Alas. Abbey Chatters, DO 03/02/2021 8:11:29 AM This report has been signed electronically. Number of Addenda: 0

## 2021-03-04 LAB — SURGICAL PATHOLOGY

## 2021-03-08 ENCOUNTER — Encounter (HOSPITAL_COMMUNITY): Payer: Self-pay | Admitting: Internal Medicine

## 2021-03-11 ENCOUNTER — Other Ambulatory Visit: Payer: Self-pay

## 2021-03-11 ENCOUNTER — Ambulatory Visit (INDEPENDENT_AMBULATORY_CARE_PROVIDER_SITE_OTHER): Payer: Medicaid Other | Admitting: General Surgery

## 2021-03-11 ENCOUNTER — Encounter: Payer: Self-pay | Admitting: General Surgery

## 2021-03-11 ENCOUNTER — Telehealth: Payer: Self-pay | Admitting: *Deleted

## 2021-03-11 VITALS — BP 134/84 | HR 76 | Temp 97.8°F | Resp 14 | Ht 65.0 in | Wt 157.0 lb

## 2021-03-11 DIAGNOSIS — C187 Malignant neoplasm of sigmoid colon: Secondary | ICD-10-CM

## 2021-03-11 MED ORDER — NEOMYCIN SULFATE 500 MG PO TABS: 1000.0000 mg | ORAL_TABLET | Freq: Three times a day (TID) | ORAL | 0 refills | Status: DC

## 2021-03-11 MED ORDER — SUTAB 1479-225-188 MG PO TABS: 1.0000 | ORAL_TABLET | Freq: Once | ORAL | 0 refills | Status: AC

## 2021-03-11 MED ORDER — METRONIDAZOLE 500 MG PO TABS: 500.0000 mg | ORAL_TABLET | Freq: Three times a day (TID) | ORAL | 0 refills | Status: DC

## 2021-03-11 NOTE — Telephone Encounter (Signed)
Received fax requesting prior authorization for Neomycin.  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   PA- [id number] has been faxed to the plan as a paper copy. Please contact the plan directly if you haven't received a determination in a typical timeframe.  You will be notified of the determination via fax.

## 2021-03-12 NOTE — Progress Notes (Signed)
Diana Fox; [id number]; 1970-12-06   HPI Patient is a 51 year old black female who was referred to my care by Dr. Abbey Chatters of gastroenterology and Dr. 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  This was found on colonoscopy for work-up of an abnormal CT scan of the abdomen.  At approximately 18 cm from the anus, a stricture was found and could not be traversed even with the pediatric colonoscope.  Biopsies of this area were taken and adenocarcinoma of the colon was found.  Patient has had multiple issues including intermittent partial small bowel obstruction with inflammation of the sigmoid colon. Past Medical History:  Diagnosis Date   Anemia    Back pain    Hypertension     Past Surgical History:  Procedure Laterality Date   BIOPSY  03/02/2021   Procedure: BIOPSY;  Surgeon: Eloise Harman, DO;  Location: [facility];  Service: Endoscopy;;   CHOLECYSTECTOMY     ESOPHAGOGASTRODUODENOSCOPY (EGD) WITH PROPOFOL N/A 03/02/2021   Procedure: ESOPHAGOGASTRODUODENOSCOPY (EGD) WITH PROPOFOL;  Surgeon: Eloise Harman, DO;  Location: [facility];  Service: Endoscopy;  Laterality: N/A;   SCLEROTHERAPY  03/02/2021   Procedure: SCLEROTHERAPY;  Surgeon: Eloise Harman, DO;  Location: [facility];  Service: Endoscopy;;   SIGMOIDOSCOPY  03/02/2021   Procedure: Lonell Face;  Surgeon: Eloise Harman, DO;  Location: [facility];  Service: Endoscopy;;    Family History  Problem Relation Age of Onset   Hypertension Mother    Hypertension Father    Colon cancer Neg Hx    Colon polyps Neg Hx    Inflammatory bowel disease Neg Hx     Current Outpatient Medications on File Prior to Visit  Medication Sig Dispense Refill   Cholecalciferol 50 MCG (2000 UT) TABS Take by mouth daily.     ferrous sulfate 325 (65 FE) MG tablet TAKE 325 MG BY MOUTH DAILY. 90 tablet 1   fexofenadine (ALLEGRA) 180 MG tablet Take 1 tablet (180 mg total) by mouth daily. 30 tablet 0    fluticasone (FLONASE) 50 MCG/ACT nasal spray Place 2 sprays into both nostrils daily. 16 g 12   medroxyPROGESTERone (PROVERA) 5 MG tablet Take 5 mg by mouth daily.     Multiple Vitamin (MULTIVITAMIN) tablet Take 1 tablet by mouth daily.     nebivolol (BYSTOLIC) 10 MG tablet Take 1 tablet (10 mg total) by mouth daily. 30 tablet 1   pantoprazole (PROTONIX) 40 MG tablet Take 40 mg by mouth daily.     feeding supplement (ENSURE ENLIVE / ENSURE PLUS) LIQD Take 237 mLs by mouth 2 (two) times daily between meals. (Patient not taking: Reported on 02/23/2021) 237 mL 60   No current facility-administered medications on file prior to visit.    Allergies  Allergen Reactions   Azithromycin Hives    infusing arm began to swell , her face got red, and she broke out, there are bumps around where the IV site was infusing   Motrin [Ibuprofen] Itching   Lisinopril     cough   Sudafed [Pseudoephedrine Hcl] Other (See Comments)    Hypertension    Social History   Substance and Sexual Activity  Alcohol Use No    Social History   Tobacco Use  Smoking Status Never  Smokeless Tobacco Never    Review of Systems  Constitutional: Negative.   HENT:  Positive for sinus pain.   Eyes: Negative.   Respiratory: Negative.    Cardiovascular: Negative.  Gastrointestinal:  Positive for abdominal pain and heartburn.  Genitourinary: Negative.   Musculoskeletal:  Positive for back pain.  Skin: Negative.   Neurological: Negative.   Endo/Heme/Allergies: Negative.   Psychiatric/Behavioral: Negative.     Objective   Vitals:   03/11/21 0910  BP: 134/84  Pulse: 76  Resp: 14  Temp: 97.8 F (36.6 C)    Physical Exam Vitals reviewed.  Constitutional:      Appearance: Normal appearance. She is normal weight. She is not ill-appearing.  HENT:     Head: Normocephalic and atraumatic.  Cardiovascular:     Rate and Rhythm: Normal rate and regular rhythm.     Heart sounds: Normal heart sounds. No murmur  heard.   No friction rub. No gallop.  Pulmonary:     Effort: Pulmonary effort is normal. No respiratory distress.     Breath sounds: Normal breath sounds. No stridor. No wheezing, rhonchi or rales.  Abdominal:     General: Bowel sounds are normal. There is no distension.     Palpations: Abdomen is soft. There is mass.     Tenderness: There is no abdominal tenderness. There is no guarding or rebound.     Hernia: No hernia is present.     Comments: I do feel firmness and a mass in the left lower quadrant that would be consistent with inflammation and dilatation of the sigmoid colon.  Skin:    General: Skin is warm and dry.  Neurological:     Mental Status: She is alert and oriented to person, place, and time.   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  The risks and benefits of the procedure including bleeding, infection, the possible need for blood transfusion, anastomotic leak, and cardiopulmonary difficulties were fully explained to the patient, who gave informed consent.  Sutabs, Flagyl, neomycin have been ordered for bowel preparation.

## 2021-03-12 NOTE — H&P (Signed)
Diana Fox; [id number]; 1971/01/07   HPI Patient is a 51 year old black female who was referred to my care by Dr. Abbey Chatters of gastroenterology and Dr. 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  This was found on colonoscopy for work-up of an abnormal CT scan of the abdomen.  At approximately 18 cm from the anus, a stricture was found and could not be traversed even with the pediatric colonoscope.  Biopsies of this area were taken and adenocarcinoma of the colon was found.  Patient has had multiple issues including intermittent partial small bowel obstruction with inflammation of the sigmoid colon. Past Medical History:  Diagnosis Date   Anemia    Back pain    Hypertension     Past Surgical History:  Procedure Laterality Date   BIOPSY  03/02/2021   Procedure: BIOPSY;  Surgeon: Eloise Harman, DO;  Location: [facility];  Service: Endoscopy;;   CHOLECYSTECTOMY     ESOPHAGOGASTRODUODENOSCOPY (EGD) WITH PROPOFOL N/A 03/02/2021   Procedure: ESOPHAGOGASTRODUODENOSCOPY (EGD) WITH PROPOFOL;  Surgeon: Eloise Harman, DO;  Location: [facility];  Service: Endoscopy;  Laterality: N/A;   SCLEROTHERAPY  03/02/2021   Procedure: SCLEROTHERAPY;  Surgeon: Eloise Harman, DO;  Location: [facility];  Service: Endoscopy;;   SIGMOIDOSCOPY  03/02/2021   Procedure: Lonell Face;  Surgeon: Eloise Harman, DO;  Location: [facility];  Service: Endoscopy;;    Family History  Problem Relation Age of Onset   Hypertension Mother    Hypertension Father    Colon cancer Neg Hx    Colon polyps Neg Hx    Inflammatory bowel disease Neg Hx     Current Outpatient Medications on File Prior to Visit  Medication Sig Dispense Refill   Cholecalciferol 50 MCG (2000 UT) TABS Take by mouth daily.     ferrous sulfate 325 (65 FE) MG tablet TAKE 325 MG BY MOUTH DAILY. 90 tablet 1   fexofenadine (ALLEGRA) 180 MG tablet Take 1 tablet (180 mg total) by mouth daily. 30 tablet 0    fluticasone (FLONASE) 50 MCG/ACT nasal spray Place 2 sprays into both nostrils daily. 16 g 12   medroxyPROGESTERone (PROVERA) 5 MG tablet Take 5 mg by mouth daily.     Multiple Vitamin (MULTIVITAMIN) tablet Take 1 tablet by mouth daily.     nebivolol (BYSTOLIC) 10 MG tablet Take 1 tablet (10 mg total) by mouth daily. 30 tablet 1   pantoprazole (PROTONIX) 40 MG tablet Take 40 mg by mouth daily.     feeding supplement (ENSURE ENLIVE / ENSURE PLUS) LIQD Take 237 mLs by mouth 2 (two) times daily between meals. (Patient not taking: Reported on 02/23/2021) 237 mL 60   No current facility-administered medications on file prior to visit.    Allergies  Allergen Reactions   Azithromycin Hives    infusing arm began to swell , her face got red, and she broke out, there are bumps around where the IV site was infusing   Motrin [Ibuprofen] Itching   Lisinopril     cough   Sudafed [Pseudoephedrine Hcl] Other (See Comments)    Hypertension    Social History   Substance and Sexual Activity  Alcohol Use No    Social History   Tobacco Use  Smoking Status Never  Smokeless Tobacco Never    Review of Systems  Constitutional: Negative.   HENT:  Positive for sinus pain.   Eyes: Negative.   Respiratory: Negative.    Cardiovascular: Negative.  Gastrointestinal:  Positive for abdominal pain and heartburn.  Genitourinary: Negative.   Musculoskeletal:  Positive for back pain.  Skin: Negative.   Neurological: Negative.   Endo/Heme/Allergies: Negative.   Psychiatric/Behavioral: Negative.     Objective   Vitals:   03/11/21 0910  BP: 134/84  Pulse: 76  Resp: 14  Temp: 97.8 F (36.6 C)    Physical Exam Vitals reviewed.  Constitutional:      Appearance: Normal appearance. She is normal weight. She is not ill-appearing.  HENT:     Head: Normocephalic and atraumatic.  Cardiovascular:     Rate and Rhythm: Normal rate and regular rhythm.     Heart sounds: Normal heart sounds. No murmur  heard.   No friction rub. No gallop.  Pulmonary:     Effort: Pulmonary effort is normal. No respiratory distress.     Breath sounds: Normal breath sounds. No stridor. No wheezing, rhonchi or rales.  Abdominal:     General: Bowel sounds are normal. There is no distension.     Palpations: Abdomen is soft. There is mass.     Tenderness: There is no abdominal tenderness. There is no guarding or rebound.     Hernia: No hernia is present.     Comments: I do feel firmness and a mass in the left lower quadrant that would be consistent with inflammation and dilatation of the sigmoid colon.  Skin:    General: Skin is warm and dry.  Neurological:     Mental Status: She is alert and oriented to person, place, and time.   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  The risks and benefits of the procedure including bleeding, infection, the possible need for blood transfusion, anastomotic leak, and cardiopulmonary difficulties were fully explained to the patient, who gave informed consent.  Sutabs, Flagyl, neomycin have been ordered for bowel preparation.

## 2021-03-12 NOTE — Telephone Encounter (Signed)
Received approval for the Neomycin for qty of 6 for 1 day and for 1 month 03/11/21 - 04/08/21. Approval faxed to [facility] at [phone] and received confirmation.

## 2021-03-23 NOTE — Patient Instructions (Signed)
Diana Fox  03/23/2021     @PREFPERIOPPHARMACY @   Your procedure is scheduled on  03/26/2021.   Report to [facility] at  0700 A.M.   Call this number if you have problems the morning of surgery:  [phone]   Remember:  Follow the diet and prep instructions given to you by the office.    Take these medicines the morning of surgery with A SIP OF WATER                 allegra, Bystolic, protonix.    Do not wear jewelry, make-up or nail polish.  Do not wear lotions, powders, or perfumes, or deodorant.  Do not shave 48 hours prior to surgery.  Men may shave face and neck.  Do not bring valuables to the hospital.  [facility] is not responsible for any belongings or valuables.  Contacts, dentures or bridgework may not be worn into surgery.  Leave your suitcase in the car.  After surgery it may be brought to your room.  For patients admitted to the hospital, discharge time will be determined by your treatment team.  Patients discharged the day of surgery will not be allowed to drive home and must have someone with them for 24 hours.    Special instructions:   DO NOT smoke tobacco or vape for 24 hours before your procedure.  Please read over the following fact sheets that you were given. Pain Booklet, Coughing and Deep Breathing, Blood Transfusion Information, Lab Information, Surgical Site Infection Prevention, Anesthesia Post-op Instructions, and Care and Recovery After Surgery      Open Total Colectomy, Care After This sheet gives you information about how to care for yourself after your procedure. Your health care provider may also give you more specific instructions. If you have problems or questions, contact your health care provider. What can I expect after the procedure? After the procedure, it is common to have: Pain in your abdomen, especially along your incision. Tiredness. Your energy level will return to normal over the next several  weeks. Constipation. Nausea. Difficulty urinating. Follow these instructions at home: Medicines Take over-the-counter and prescription medicines, including stool softeners, only as told by your health care provider. Ask your health care provider if the medicine prescribed to you requires you to avoid driving or using machinery. Eating and drinking Follow instructions from your health care provider about eating or drinking restrictions. Eat a low-fiber diet for the first 4 weeks after surgery. Most people on a low-fiber eating plan should eat less than 10 grams (g) of fiber a day. Follow recommendations from your health care provider or dietitian about how much fiber you should have each day. Always check food labels to know the fiber content of packaged foods. In general, a low-fiber food will have fewer than 2 g of fiber a serving. In general, try to avoid whole grains, raw fruits and vegetables, dried fruit, tough cuts of meat, nuts, and seeds. Incision care  Follow instructions from your health care provider about how to take care of your incision. Make sure you: Wash your hands with soap and water for at least 20 seconds before and after you change your bandage (dressing). If soap and water are not available, use hand sanitizer. Change your dressing as told by your health care provider. Leave stitches (sutures), staples, or adhesive strips in place. These skin closures may need to stay in place for 2 weeks or longer. If  adhesive strip edges start to loosen and curl up, you may trim the loose edges. Do not remove adhesive strips completely unless your health care provider tells you to do that. Avoid wearing tight clothing around your incision. Protect your incision area from the sun. Do not take baths, swim, or use a hot tub until your health care provider approves. Ask your health care provider if you may take showers. You may only be allowed to take sponge baths. Check your incision area  every day for signs of infection. Check for: More redness, swelling, or pain. Fluid or blood. Warmth. Pus or a bad smell. Managing constipation Your procedure may cause constipation. To prevent or treat constipation, you may need to: Drink enough fluid to keep your urine pale yellow. Take over-the-counter or prescription medicines. Activity  Rest as told by your health care provider. Avoid sitting for a long time without moving. Get up to take short walks every 1-2 hours. This is important to improve blood flow and breathing. Ask for help if you feel weak or unsteady. Avoid activities that require a lot of energy for 4-6 weeks after surgery, such as running, climbing, and lifting heavy objects. You may need to continue to do breathing exercises. Do not lift anything that is heavier than 10 lb (4.5 kg), or the limit that you are told, until your health care provider says that it is safe. Do not drive until you are no longer taking prescription pain medicines and you feel you are able to drive safely. This might take up to 4 weeks. Return to your normal activities as told by your health care provider. Ask your health care provider what activities are safe for you. General instructions If you have an opening (stoma) in your abdomen, follow instructions from your health care provider about how to care for the stoma and the external pouch attached to it (ostomy pouch). Do not use any products that contain nicotine or tobacco, such as cigarettes, e-cigarettes, and chewing tobacco. These can delay incision healing after surgery. If you need help quitting, ask your health care provider. Keep all follow-up visits as told by your health care provider. This is important. Contact a health care provider if: You have any of these signs of infection: More redness, swelling, or pain around your incision. Fluid or blood coming from your incision. Warmth coming from your incision. Pus or a bad smell coming  from your incision. A fever or chills. You do not have a bowel movement within 2-3 days after surgery. You cannot eat or drink for 24 hours or longer. You have nausea and vomiting that will not go away. You have pain in your abdomen that gets worse and does not get better with medicine. Get help right away if: You have chest pain. You have shortness of breath. You have pain or swelling in your legs. Your incision breaks open after your sutures, staples, or adhesive strips have been removed. You have bleeding from the rectum. These symptoms may represent a serious problem that is an emergency. Do not wait to see if the symptoms will go away. Get medical help right away. Call your local emergency services (911 in the [place] Do not drive yourself to the hospital. Summary After your procedure, it is common to have pain, tiredness, constipation, nausea, and difficulty urinating. If you have an opening (stoma) in your abdomen, follow instructions from your health care provider about how to care for the stoma and the external pouch attached to  it (ostomy pouch). Follow instructions from your health care provider about eating and drinking and about how to take care of your incision. Keep all follow-up visits as told by your health care provider. This is important. This information is not intended to replace advice given to you by your health care provider. Make sure you discuss any questions you have with your health care provider. Document Revised: 01/02/2019 Document Reviewed: 01/02/2019 Elsevier Patient Education  2022 [place] Anesthesia, Adult, Care After This sheet gives you information about how to care for yourself after your procedure. Your health care provider may also give you more specific instructions. If you have problems or questions, contact your health care provider. What can I expect after the procedure? After the procedure, the following side effects are common: Pain  or discomfort at the IV site. Nausea. Vomiting. Sore throat. Trouble concentrating. Feeling cold or chills. Feeling weak or tired. Sleepiness and fatigue. Soreness and body aches. These side effects can affect parts of the body that were not involved in surgery. Follow these instructions at home: For the time period you were told by your health care provider:  Rest. Do not participate in activities where you could fall or become injured. Do not drive or use machinery. Do not drink alcohol. Do not take sleeping pills or medicines that cause drowsiness. Do not make important decisions or sign legal documents. Do not take care of children on your own. Eating and drinking Follow any instructions from your health care provider about eating or drinking restrictions. When you feel hungry, start by eating small amounts of foods that are soft and easy to digest (bland), such as toast. Gradually return to your regular diet. Drink enough fluid to keep your urine pale yellow. If you vomit, rehydrate by drinking water, juice, or clear broth. General instructions If you have sleep apnea, surgery and certain medicines can increase your risk for breathing problems. Follow instructions from your health care provider about wearing your sleep device: Anytime you are sleeping, including during daytime naps. While taking prescription pain medicines, sleeping medicines, or medicines that make you drowsy. Have a responsible adult stay with you for the time you are told. It is important to have someone help care for you until you are awake and alert. Return to your normal activities as told by your health care provider. Ask your health care provider what activities are safe for you. Take over-the-counter and prescription medicines only as told by your health care provider. If you smoke, do not smoke without supervision. Keep all follow-up visits as told by your health care provider. This is  important. Contact a health care provider if: You have nausea or vomiting that does not get better with medicine. You cannot eat or drink without vomiting. You have pain that does not get better with medicine. You are unable to pass urine. You develop a skin rash. You have a fever. You have redness around your IV site that gets worse. Get help right away if: You have difficulty breathing. You have chest pain. You have blood in your urine or stool, or you vomit blood. Summary After the procedure, it is common to have a sore throat or nausea. It is also common to feel tired. Have a responsible adult stay with you for the time you are told. It is important to have someone help care for you until you are awake and alert. When you feel hungry, start by eating small amounts of foods that are  soft and easy to digest (bland), such as toast. Gradually return to your regular diet. Drink enough fluid to keep your urine pale yellow. Return to your normal activities as told by your health care provider. Ask your health care provider what activities are safe for you. This information is not intended to replace advice given to you by your health care provider. Make sure you discuss any questions you have with your health care provider. Document Revised: 10/10/2019 Document Reviewed: 05/09/2019 Elsevier Patient Education  2022 [place]. How to Use Chlorhexidine for Bathing Chlorhexidine gluconate (CHG) is a germ-killing (antiseptic) solution that is used to clean the skin. It can get rid of the bacteria that normally live on the skin and can keep them away for about 24 hours. To clean your skin with CHG, you may be given: A CHG solution to use in the shower or as part of a sponge bath. A prepackaged cloth that contains CHG. Cleaning your skin with CHG may help lower the risk for infection: While you are staying in the intensive care unit of the hospital. If you have a vascular access, such as a  central line, to provide short-term or long-term access to your veins. If you have a catheter to drain urine from your bladder. If you are on a ventilator. A ventilator is a machine that helps you breathe by moving air in and out of your lungs. After surgery. What are the risks? Risks of using CHG include: A skin reaction. Hearing loss, if CHG gets in your ears and you have a perforated eardrum. Eye injury, if CHG gets in your eyes and is not rinsed out. The CHG product catching fire. Make sure that you avoid smoking and flames after applying CHG to your skin. Do not use CHG: If you have a chlorhexidine allergy or have previously reacted to chlorhexidine. On babies younger than 43 months of age. How to use CHG solution Use CHG only as told by your health care provider, and follow the instructions on the label. Use the full amount of CHG as directed. Usually, this is one bottle. During a shower Follow these steps when using CHG solution during a shower (unless your health care provider gives you different instructions): Start the shower. Use your normal soap and shampoo to wash your face and hair. Turn off the shower or move out of the shower stream. Pour the CHG onto a clean washcloth. Do not use any type of brush or rough-edged sponge. Starting at your neck, lather your body down to your toes. Make sure you follow these instructions: If you will be having surgery, pay special attention to the part of your body where you will be having surgery. Scrub this area for at least 1 minute. Do not use CHG on your head or face. If the solution gets into your ears or eyes, rinse them well with water. Avoid your genital area. Avoid any areas of skin that have broken skin, cuts, or scrapes. Scrub your back and under your arms. Make sure to wash skin folds. Let the lather sit on your skin for 1-2 minutes or as long as told by your health care provider. Thoroughly rinse your entire body in the shower.  Make sure that all body creases and crevices are rinsed well. Dry off with a clean towel. Do not put any substances on your body afterward--such as powder, lotion, or perfume--unless you are told to do so by your health care provider. Only use lotions that  are recommended by the manufacturer. Put on clean clothes or pajamas. If it is the night before your surgery, sleep in clean sheets.  During a sponge bath Follow these steps when using CHG solution during a sponge bath (unless your health care provider gives you different instructions): Use your normal soap and shampoo to wash your face and hair. Pour the CHG onto a clean washcloth. Starting at your neck, lather your body down to your toes. Make sure you follow these instructions: If you will be having surgery, pay special attention to the part of your body where you will be having surgery. Scrub this area for at least 1 minute. Do not use CHG on your head or face. If the solution gets into your ears or eyes, rinse them well with water. Avoid your genital area. Avoid any areas of skin that have broken skin, cuts, or scrapes. Scrub your back and under your arms. Make sure to wash skin folds. Let the lather sit on your skin for 1-2 minutes or as long as told by your health care provider. Using a different clean, wet washcloth, thoroughly rinse your entire body. Make sure that all body creases and crevices are rinsed well. Dry off with a clean towel. Do not put any substances on your body afterward--such as powder, lotion, or perfume--unless you are told to do so by your health care provider. Only use lotions that are recommended by the manufacturer. Put on clean clothes or pajamas. If it is the night before your surgery, sleep in clean sheets. How to use CHG prepackaged cloths Only use CHG cloths as told by your health care provider, and follow the instructions on the label. Use the CHG cloth on clean, dry skin. Do not use the CHG cloth on  your head or face unless your health care provider tells you to. When washing with the CHG cloth: Avoid your genital area. Avoid any areas of skin that have broken skin, cuts, or scrapes. Before surgery Follow these steps when using a CHG cloth to clean before surgery (unless your health care provider gives you different instructions): Using the CHG cloth, vigorously scrub the part of your body where you will be having surgery. Scrub using a back-and-forth motion for 3 minutes. The area on your body should be completely wet with CHG when you are done scrubbing. Do not rinse. Discard the cloth and let the area air-dry. Do not put any substances on the area afterward, such as powder, lotion, or perfume. Put on clean clothes or pajamas. If it is the night before your surgery, sleep in clean sheets.  For general bathing Follow these steps when using CHG cloths for general bathing (unless your health care provider gives you different instructions). Use a separate CHG cloth for each area of your body. Make sure you wash between any folds of skin and between your fingers and toes. Wash your body in the following order, switching to a new cloth after each step: The front of your neck, shoulders, and chest. Both of your arms, under your arms, and your hands. Your stomach and groin area, avoiding the genitals. Your right leg and foot. Your left leg and foot. The back of your neck, your back, and your buttocks. Do not rinse. Discard the cloth and let the area air-dry. Do not put any substances on your body afterward--such as powder, lotion, or perfume--unless you are told to do so by your health care provider. Only use lotions that are recommended by  the manufacturer. Put on clean clothes or pajamas. Contact a health care provider if: Your skin gets irritated after scrubbing. You have questions about using your solution or cloth. You swallow any chlorhexidine. Call your local poison control center  (1-504-875-0465 in the [place] Get help right away if: Your eyes itch badly, or they become very red or swollen. Your skin itches badly and is red or swollen. Your hearing changes. You have trouble seeing. You have swelling or tingling in your mouth or throat. You have trouble breathing. These symptoms may represent a serious problem that is an emergency. Do not wait to see if the symptoms will go away. Get medical help right away. Call your local emergency services (911 in the [place] Do not drive yourself to the hospital. Summary Chlorhexidine gluconate (CHG) is a germ-killing (antiseptic) solution that is used to clean the skin. Cleaning your skin with CHG may help to lower your risk for infection. You may be given CHG to use for bathing. It may be in a bottle or in a prepackaged cloth to use on your skin. Carefully follow your health care provider's instructions and the instructions on the product label. Do not use CHG if you have a chlorhexidine allergy. Contact your health care provider if your skin gets irritated after scrubbing. This information is not intended to replace advice given to you by your health care provider. Make sure you discuss any questions you have with your health care provider. Document Revised: 04/06/2020 Document Reviewed: 04/06/2020 Elsevier Patient Education  2022 [employer].

## 2021-03-24 ENCOUNTER — Encounter (HOSPITAL_COMMUNITY)
Admission: RE | Admit: 2021-03-24 | Discharge: 2021-03-24 | Disposition: A | Discharge status: Home / Self Care | Payer: Medicaid Other | Source: Ambulatory Visit | Attending: General Surgery | Admitting: General Surgery

## 2021-03-24 ENCOUNTER — Encounter (HOSPITAL_COMMUNITY): Payer: Self-pay

## 2021-03-24 ENCOUNTER — Other Ambulatory Visit (HOSPITAL_COMMUNITY)
Admission: RE | Admit: 2021-03-24 | Discharge: 2021-03-24 | Disposition: A | Discharge status: Home / Self Care | Payer: Medicaid Other | Source: Ambulatory Visit | Attending: General Surgery | Admitting: General Surgery

## 2021-03-24 VITALS — BP 151/78 | HR 93 | Temp 97.8°F | Resp 18 | Ht 65.0 in | Wt 157.0 lb

## 2021-03-24 DIAGNOSIS — C189 Malignant neoplasm of colon, unspecified: Secondary | ICD-10-CM | POA: Diagnosis not present

## 2021-03-24 DIAGNOSIS — Z01818 Encounter for other preprocedural examination: Secondary | ICD-10-CM

## 2021-03-24 DIAGNOSIS — Z20822 Contact with and (suspected) exposure to covid-19: Secondary | ICD-10-CM | POA: Diagnosis not present

## 2021-03-24 LAB — CBC WITH DIFFERENTIAL/PLATELET
Abs Immature Granulocytes: 0.03 10*3/uL (ref 0.00–0.07)
Basophils Absolute: 0 10*3/uL (ref 0.0–0.1)
Basophils Relative: 1 %
Eosinophils Absolute: 0.2 10*3/uL (ref 0.0–0.5)
Eosinophils Relative: 3 %
HCT: 31.7 % — ABNORMAL LOW (ref 36.0–46.0)
Hemoglobin: 10.2 g/dL — ABNORMAL LOW (ref 12.0–15.0)
Immature Granulocytes: 1 %
Lymphocytes Relative: 36 %
Lymphs Abs: 2.3 10*3/uL (ref 0.7–4.0)
MCH: 26.2 pg (ref 26.0–34.0)
MCHC: 32.2 g/dL (ref 30.0–36.0)
MCV: 81.3 fL (ref 80.0–100.0)
Monocytes Absolute: 0.5 10*3/uL (ref 0.1–1.0)
Monocytes Relative: 7 %
Neutro Abs: 3.5 10*3/uL (ref 1.7–7.7)
Neutrophils Relative %: 52 %
Platelets: 408 10*3/uL — ABNORMAL HIGH (ref 150–400)
RBC: 3.9 MIL/uL (ref 3.87–5.11)
RDW: 18.1 % — ABNORMAL HIGH (ref 11.5–15.5)
WBC: 6.5 10*3/uL (ref 4.0–10.5)
nRBC: 0 % (ref 0.0–0.2)

## 2021-03-24 LAB — COMPREHENSIVE METABOLIC PANEL
ALT: 13 U/L (ref 0–44)
AST: 13 U/L — ABNORMAL LOW (ref 15–41)
Albumin: 3.7 g/dL (ref 3.5–5.0)
Alkaline Phosphatase: 44 U/L (ref 38–126)
Anion gap: 12 (ref 5–15)
BUN: 8 mg/dL (ref 6–20)
CO2: 18 mmol/L — ABNORMAL LOW (ref 22–32)
Calcium: 8.9 mg/dL (ref 8.9–10.3)
Chloride: 107 mmol/L (ref 98–111)
Creatinine, Ser: 0.62 mg/dL (ref 0.44–1.00)
GFR, Estimated: >60 mL/min (ref >60)
Glucose, Bld: 96 mg/dL (ref 70–99)
Potassium: 3.3 mmol/L — ABNORMAL LOW (ref 3.5–5.1)
Sodium: 137 mmol/L (ref 135–145)
Total Bilirubin: 0.2 mg/dL — ABNORMAL LOW (ref 0.3–1.2)
Total Protein: 7.3 g/dL (ref 6.5–8.1)

## 2021-03-24 LAB — SARS CORONAVIRUS 2 (TAT 6-24 HRS): SARS Coronavirus 2: NEGATIVE

## 2021-03-24 LAB — HCG, SERUM, QUALITATIVE: Preg, Serum: NEGATIVE

## 2021-03-24 NOTE — Progress Notes (Signed)
To whom it may concern,  Please excuse Ms Dewanda Fennema from work 03/23/2021.  She will be taking a bowel prep prior to her surgery 03/24/2021  Thank You,  Rosalyn Gess BSN, RN, CNOR

## 2021-03-25 LAB — CEA: CEA: 61.5 ng/mL — ABNORMAL HIGH (ref 0.0–4.7)

## 2021-03-26 ENCOUNTER — Other Ambulatory Visit: Payer: Self-pay

## 2021-03-26 ENCOUNTER — Encounter (HOSPITAL_COMMUNITY)
Admission: RE | Disposition: A | Discharge status: Home / Self Care | Payer: Self-pay | Source: Home / Self Care | Attending: General Surgery

## 2021-03-26 ENCOUNTER — Inpatient Hospital Stay (HOSPITAL_COMMUNITY)
Admission: RE | Admit: 2021-03-26 | Discharge: 2021-03-30 | DRG: 330 | Disposition: A | Discharge status: Home / Self Care | Payer: Medicaid Other | Attending: General Surgery | Admitting: General Surgery

## 2021-03-26 ENCOUNTER — Encounter (HOSPITAL_COMMUNITY): Payer: Self-pay | Admitting: General Surgery

## 2021-03-26 ENCOUNTER — Inpatient Hospital Stay (HOSPITAL_COMMUNITY): Payer: Medicaid Other | Admitting: Anesthesiology

## 2021-03-26 DIAGNOSIS — I10 Essential (primary) hypertension: Secondary | ICD-10-CM | POA: Diagnosis present

## 2021-03-26 DIAGNOSIS — D630 Anemia in neoplastic disease: Secondary | ICD-10-CM

## 2021-03-26 DIAGNOSIS — Z881 Allergy status to other antibiotic agents status: Secondary | ICD-10-CM | POA: Diagnosis not present

## 2021-03-26 DIAGNOSIS — C179 Malignant neoplasm of small intestine, unspecified: Secondary | ICD-10-CM | POA: Diagnosis not present

## 2021-03-26 DIAGNOSIS — C784 Secondary malignant neoplasm of small intestine: Secondary | ICD-10-CM

## 2021-03-26 DIAGNOSIS — C187 Malignant neoplasm of sigmoid colon: Secondary | ICD-10-CM | POA: Diagnosis present

## 2021-03-26 DIAGNOSIS — D62 Acute posthemorrhagic anemia: Secondary | ICD-10-CM | POA: Diagnosis not present

## 2021-03-26 DIAGNOSIS — Z9049 Acquired absence of other specified parts of digestive tract: Secondary | ICD-10-CM

## 2021-03-26 DIAGNOSIS — C7989 Secondary malignant neoplasm of other specified sites: Secondary | ICD-10-CM

## 2021-03-26 DIAGNOSIS — C772 Secondary and unspecified malignant neoplasm of intra-abdominal lymph nodes: Secondary | ICD-10-CM | POA: Diagnosis present

## 2021-03-26 DIAGNOSIS — D259 Leiomyoma of uterus, unspecified: Secondary | ICD-10-CM | POA: Diagnosis present

## 2021-03-26 DIAGNOSIS — C189 Malignant neoplasm of colon, unspecified: Secondary | ICD-10-CM

## 2021-03-26 DIAGNOSIS — Z8249 Family history of ischemic heart disease and other diseases of the circulatory system: Secondary | ICD-10-CM

## 2021-03-26 DIAGNOSIS — C19 Malignant neoplasm of rectosigmoid junction: Secondary | ICD-10-CM | POA: Diagnosis not present

## 2021-03-26 DIAGNOSIS — Z886 Allergy status to analgesic agent status: Secondary | ICD-10-CM

## 2021-03-26 DIAGNOSIS — Z888 Allergy status to other drugs, medicaments and biological substances status: Secondary | ICD-10-CM | POA: Diagnosis not present

## 2021-03-26 HISTORY — PX: COLOSTOMY: SHX63

## 2021-03-26 HISTORY — PX: PARTIAL COLECTOMY: SHX5273

## 2021-03-26 HISTORY — PX: BOWEL RESECTION: SHX1257

## 2021-03-26 SURGERY — COLECTOMY, PARTIAL
Anesthesia: General | Site: Abdomen

## 2021-03-26 MED ORDER — SODIUM CHLORIDE 0.9 % IV SOLN
INTRAVENOUS | Status: AC
  Filled 2021-03-26: qty 1

## 2021-03-26 MED ORDER — LACTATED RINGERS IV BOLUS: 1000.0000 mL | Freq: Once | INTRAVENOUS | Status: DC

## 2021-03-26 MED ORDER — LIDOCAINE HCL (PF) 2 % IJ SOLN
INTRAMUSCULAR | Status: AC
  Filled 2021-03-26: qty 5

## 2021-03-26 MED ORDER — DIPHENHYDRAMINE HCL 25 MG PO CAPS: 25.0000 mg | ORAL_CAPSULE | Freq: Four times a day (QID) | ORAL | Status: DC | PRN

## 2021-03-26 MED ORDER — CHLORHEXIDINE GLUCONATE CLOTH 2 % EX PADS
6.0000 | MEDICATED_PAD | Freq: Once | CUTANEOUS | Status: DC
Start: 2021-03-26 — End: 2021-03-26

## 2021-03-26 MED ORDER — ONDANSETRON HCL 4 MG/2ML IJ SOLN: 4.0000 mg | Freq: Once | INTRAMUSCULAR | Status: DC | PRN

## 2021-03-26 MED ORDER — ONDANSETRON HCL 4 MG/2ML IJ SOLN
4.0000 mg | Freq: Four times a day (QID) | INTRAMUSCULAR | Status: DC | PRN
Start: 2021-03-26 — End: 2021-03-30

## 2021-03-26 MED ORDER — HYDROMORPHONE HCL 1 MG/ML IJ SOLN
1.0000 mg | INTRAMUSCULAR | Status: DC | PRN
  Administered 2021-03-26 – 2021-03-30 (×19): 1 mg via INTRAVENOUS
  Filled 2021-03-26 (×19): qty 1

## 2021-03-26 MED ORDER — ALVIMOPAN 12 MG PO CAPS: 12.0000 mg | ORAL_CAPSULE | ORAL | Status: AC

## 2021-03-26 MED ORDER — HEMOSTATIC AGENTS (NO CHARGE) OPTIME
TOPICAL | Status: DC | PRN
  Administered 2021-03-26: 1 via TOPICAL
  Administered 2021-03-26 (×2): 1

## 2021-03-26 MED ORDER — ALVIMOPAN 12 MG PO CAPS
12.0000 mg | ORAL_CAPSULE | Freq: Two times a day (BID) | ORAL | Status: DC
  Administered 2021-03-27 – 2021-03-29 (×5): 12 mg via ORAL
  Filled 2021-03-26 (×5): qty 1

## 2021-03-26 MED ORDER — CHLORHEXIDINE GLUCONATE CLOTH 2 % EX PADS
6.0000 | MEDICATED_PAD | Freq: Once | CUTANEOUS | Status: DC
  Administered 2021-03-26: 6 via TOPICAL

## 2021-03-26 MED ORDER — PROPOFOL 10 MG/ML IV BOLUS
INTRAVENOUS | Status: AC
  Filled 2021-03-26: qty 20

## 2021-03-26 MED ORDER — BUPIVACAINE LIPOSOME 1.3 % IJ SUSP
INTRAMUSCULAR | Status: DC | PRN
Start: 2021-03-26 — End: 2021-03-26
  Administered 2021-03-26: 20 mL

## 2021-03-26 MED ORDER — LORAZEPAM 2 MG/ML IJ SOLN
1.0000 mg | INTRAMUSCULAR | Status: DC | PRN
Start: 2021-03-26 — End: 2021-03-30

## 2021-03-26 MED ORDER — ONDANSETRON HCL 4 MG/2ML IJ SOLN
INTRAMUSCULAR | Status: AC
  Filled 2021-03-26: qty 2

## 2021-03-26 MED ORDER — ORAL CARE MOUTH RINSE: 15.0000 mL | Freq: Once | OROMUCOSAL | Status: AC

## 2021-03-26 MED ORDER — DEXAMETHASONE SODIUM PHOSPHATE 10 MG/ML IJ SOLN
INTRAMUSCULAR | Status: AC
  Filled 2021-03-26: qty 1

## 2021-03-26 MED ORDER — ALVIMOPAN 12 MG PO CAPS
ORAL_CAPSULE | ORAL | Status: AC
  Administered 2021-03-26: 12 mg via ORAL
  Filled 2021-03-26: qty 1

## 2021-03-26 MED ORDER — CHLORHEXIDINE GLUCONATE CLOTH 2 % EX PADS
6.0000 | MEDICATED_PAD | Freq: Every day | CUTANEOUS | Status: DC
  Administered 2021-03-26 – 2021-03-30 (×5): 6 via TOPICAL

## 2021-03-26 MED ORDER — SODIUM CHLORIDE 0.9 % IV SOLN
1.0000 g | INTRAVENOUS | Status: AC
  Administered 2021-03-26: 1 g via INTRAVENOUS

## 2021-03-26 MED ORDER — SIMETHICONE 80 MG PO CHEW: 40.0000 mg | CHEWABLE_TABLET | Freq: Four times a day (QID) | ORAL | Status: DC | PRN

## 2021-03-26 MED ORDER — DIPHENHYDRAMINE HCL 50 MG/ML IJ SOLN: 25.0000 mg | Freq: Four times a day (QID) | INTRAMUSCULAR | Status: DC | PRN

## 2021-03-26 MED ORDER — FENTANYL CITRATE (PF) 250 MCG/5ML IJ SOLN
INTRAMUSCULAR | Status: AC
  Filled 2021-03-26: qty 5

## 2021-03-26 MED ORDER — NEBIVOLOL HCL 10 MG PO TABS
10.0000 mg | ORAL_TABLET | Freq: Every day | ORAL | Status: DC
  Administered 2021-03-27 – 2021-03-30 (×4): 10 mg via ORAL
  Filled 2021-03-26 (×4): qty 1

## 2021-03-26 MED ORDER — ENOXAPARIN SODIUM 40 MG/0.4ML IJ SOSY
40.0000 mg | PREFILLED_SYRINGE | INTRAMUSCULAR | Status: DC
  Administered 2021-03-27 – 2021-03-30 (×4): 40 mg via SUBCUTANEOUS
  Filled 2021-03-26 (×4): qty 0.4

## 2021-03-26 MED ORDER — LIDOCAINE 2% (20 MG/ML) 5 ML SYRINGE
INTRAMUSCULAR | Status: DC | PRN
Start: 2021-03-26 — End: 2021-03-26
  Administered 2021-03-26: 60 mg via INTRAVENOUS

## 2021-03-26 MED ORDER — CHLORHEXIDINE GLUCONATE 0.12 % MT SOLN
OROMUCOSAL | Status: AC
  Administered 2021-03-26: 15 mL via OROMUCOSAL
  Filled 2021-03-26: qty 15

## 2021-03-26 MED ORDER — MIDAZOLAM HCL 2 MG/2ML IJ SOLN
INTRAMUSCULAR | Status: AC
  Filled 2021-03-26: qty 2

## 2021-03-26 MED ORDER — PROPOFOL 10 MG/ML IV BOLUS
INTRAVENOUS | Status: DC | PRN
  Administered 2021-03-26: 150 mg via INTRAVENOUS

## 2021-03-26 MED ORDER — ONDANSETRON 4 MG PO TBDP: 4.0000 mg | ORAL_TABLET | Freq: Four times a day (QID) | ORAL | Status: DC | PRN

## 2021-03-26 MED ORDER — MEPERIDINE HCL 50 MG/ML IJ SOLN: 6.2500 mg | INTRAMUSCULAR | Status: DC | PRN

## 2021-03-26 MED ORDER — PANTOPRAZOLE SODIUM 40 MG PO TBEC: 40.0000 mg | DELAYED_RELEASE_TABLET | Freq: Every day | ORAL | Status: DC | PRN

## 2021-03-26 MED ORDER — ENOXAPARIN SODIUM 40 MG/0.4ML IJ SOSY
PREFILLED_SYRINGE | INTRAMUSCULAR | Status: AC
  Administered 2021-03-26: 40 mg via SUBCUTANEOUS
  Filled 2021-03-26: qty 0.4

## 2021-03-26 MED ORDER — MIDAZOLAM HCL 5 MG/5ML IJ SOLN
INTRAMUSCULAR | Status: DC | PRN
Start: 2021-03-26 — End: 2021-03-26
  Administered 2021-03-26: 2 mg via INTRAVENOUS

## 2021-03-26 MED ORDER — FENTANYL CITRATE (PF) 250 MCG/5ML IJ SOLN
INTRAMUSCULAR | Status: DC | PRN
  Administered 2021-03-26: 50 ug via INTRAVENOUS
  Administered 2021-03-26: 100 ug via INTRAVENOUS
  Administered 2021-03-26: 25 ug via INTRAVENOUS

## 2021-03-26 MED ORDER — ROCURONIUM BROMIDE 10 MG/ML (PF) SYRINGE
PREFILLED_SYRINGE | INTRAVENOUS | Status: AC
  Filled 2021-03-26: qty 10

## 2021-03-26 MED ORDER — CHLORHEXIDINE GLUCONATE 0.12 % MT SOLN: 15.0000 mL | Freq: Once | OROMUCOSAL | Status: AC

## 2021-03-26 MED ORDER — ACETAMINOPHEN 325 MG PO TABS: 650.0000 mg | ORAL_TABLET | Freq: Four times a day (QID) | ORAL | Status: DC | PRN

## 2021-03-26 MED ORDER — BUPIVACAINE LIPOSOME 1.3 % IJ SUSP
INTRAMUSCULAR | Status: AC
  Filled 2021-03-26: qty 20

## 2021-03-26 MED ORDER — SODIUM CHLORIDE 0.9 % IV SOLN: INTRAVENOUS | Status: DC

## 2021-03-26 MED ORDER — ONDANSETRON HCL 4 MG/2ML IJ SOLN
INTRAMUSCULAR | Status: DC | PRN
  Administered 2021-03-26: 4 mg via INTRAVENOUS

## 2021-03-26 MED ORDER — HYDROMORPHONE HCL 1 MG/ML IJ SOLN
0.2500 mg | INTRAMUSCULAR | Status: DC | PRN
  Administered 2021-03-26 (×2): 0.5 mg via INTRAVENOUS
  Filled 2021-03-26 (×2): qty 0.5

## 2021-03-26 MED ORDER — 0.9 % SODIUM CHLORIDE (POUR BTL) OPTIME
TOPICAL | Status: DC | PRN
  Administered 2021-03-26 (×2): 1000 mL

## 2021-03-26 MED ORDER — ENOXAPARIN SODIUM 40 MG/0.4ML IJ SOSY: 40.0000 mg | PREFILLED_SYRINGE | Freq: Once | INTRAMUSCULAR | Status: AC

## 2021-03-26 MED ORDER — OXYCODONE HCL 5 MG PO TABS: 5.0000 mg | ORAL_TABLET | ORAL | Status: DC | PRN

## 2021-03-26 MED ORDER — POVIDONE-IODINE 10 % EX OINT
TOPICAL_OINTMENT | CUTANEOUS | Status: AC
  Filled 2021-03-26: qty 1

## 2021-03-26 MED ORDER — DEXAMETHASONE SODIUM PHOSPHATE 10 MG/ML IJ SOLN
INTRAMUSCULAR | Status: DC | PRN
Start: 2021-03-26 — End: 2021-03-26
  Administered 2021-03-26: 10 mg via INTRAVENOUS

## 2021-03-26 MED ORDER — FLUTICASONE PROPIONATE 50 MCG/ACT NA SUSP
2.0000 | Freq: Every day | NASAL | Status: DC
  Administered 2021-03-27 – 2021-03-30 (×4): 2 via NASAL
  Filled 2021-03-26 (×3): qty 16

## 2021-03-26 MED ORDER — LACTATED RINGERS IV SOLN: INTRAVENOUS | Status: DC

## 2021-03-26 MED ORDER — ROCURONIUM BROMIDE 10 MG/ML (PF) SYRINGE
PREFILLED_SYRINGE | INTRAVENOUS | Status: DC | PRN
  Administered 2021-03-26: 20 mg via INTRAVENOUS
  Administered 2021-03-26: 60 mg via INTRAVENOUS
  Administered 2021-03-26: 20 mg via INTRAVENOUS

## 2021-03-26 MED ORDER — ACETAMINOPHEN 650 MG RE SUPP: 650.0000 mg | Freq: Four times a day (QID) | RECTAL | Status: DC | PRN

## 2021-03-26 MED ORDER — POVIDONE-IODINE 10 % OINT PACKET
TOPICAL_OINTMENT | CUTANEOUS | Status: DC | PRN
  Administered 2021-03-26: 1 via TOPICAL

## 2021-03-26 MED ORDER — SUGAMMADEX SODIUM 500 MG/5ML IV SOLN
INTRAVENOUS | Status: AC
  Filled 2021-03-26: qty 5

## 2021-03-26 MED ORDER — SUGAMMADEX SODIUM 200 MG/2ML IV SOLN
INTRAVENOUS | Status: DC | PRN
  Administered 2021-03-26: 200 mg via INTRAVENOUS

## 2021-03-26 SURGICAL SUPPLY — 70 items
APL SKNCLS STERI-STRIP NONHPOA (GAUZE/BANDAGES/DRESSINGS) ×1
BARRIER SKIN 2 1/4 (WOUND CARE) ×2 IMPLANT
BARRIER SKIN 2 3/4 (OSTOMY) ×2 IMPLANT
BARRIER SKIN OD1.75 2 1/4 FLNG (WOUND CARE) IMPLANT
BARRIER SKIN OD2.25 2 3/4 FLNG (OSTOMY) IMPLANT
BENZOIN TINCTURE PRP APPL 2/3 (GAUZE/BANDAGES/DRESSINGS) ×1 IMPLANT
BRR SKN FLT 1.75X2.25 2 PC (WOUND CARE) ×1
BRR SKN FLT 2.75X2.25 2 PC (OSTOMY) ×1
CLAMP POUCH DRAINAGE QUIET (OSTOMY) IMPLANT
COVER LIGHT HANDLE STERIS (MISCELLANEOUS) ×8 IMPLANT
DRESSING MEPILEX BORDER 6X8 (GAUZE/BANDAGES/DRESSINGS) IMPLANT
DRSG MEPILEX BORDER 6X8 (GAUZE/BANDAGES/DRESSINGS) ×2
DRSG OPSITE POSTOP 4X10 (GAUZE/BANDAGES/DRESSINGS) IMPLANT
DRSG OPSITE POSTOP 4X8 (GAUZE/BANDAGES/DRESSINGS) ×1 IMPLANT
ELECT REM PT RETURN 9FT ADLT (ELECTROSURGICAL) ×2
ELECTRODE REM PT RTRN 9FT ADLT (ELECTROSURGICAL) ×1 IMPLANT
GLOVE SURG ENC MOIS LTX SZ6.5 (GLOVE) ×4 IMPLANT
GLOVE SURG POLYISO LF SZ7.5 (GLOVE) ×4 IMPLANT
GLOVE SURG UNDER POLY LF SZ6.5 (GLOVE) ×4 IMPLANT
GLOVE SURG UNDER POLY LF SZ7 (GLOVE) ×10 IMPLANT
GOWN STRL REUS W/TWL LRG LVL3 (GOWN DISPOSABLE) ×12 IMPLANT
HEMOSTAT SURGICEL 4X8 (HEMOSTASIS) ×1 IMPLANT
INST SET MAJOR GENERAL (KITS) ×2 IMPLANT
KIT BLADEGUARD II DBL (SET/KITS/TRAYS/PACK) ×1 IMPLANT
KIT TURNOVER KIT A (KITS) ×2 IMPLANT
LIGASURE IMPACT 36 18CM CVD LR (INSTRUMENTS) ×3 IMPLANT
MANIFOLD NEPTUNE II (INSTRUMENTS) ×2 IMPLANT
NDL HYPO 18GX1.5 BLUNT FILL (NEEDLE) ×1 IMPLANT
NDL HYPO 21X1.5 SAFETY (NEEDLE) IMPLANT
NEEDLE HYPO 18GX1.5 BLUNT FILL (NEEDLE) ×2 IMPLANT
NEEDLE HYPO 21X1.5 SAFETY (NEEDLE) ×2 IMPLANT
NS IRRIG 1000ML POUR BTL (IV SOLUTION) ×4 IMPLANT
PACK COLON (CUSTOM PROCEDURE TRAY) ×2 IMPLANT
PAD ARMBOARD 7.5X6 YLW CONV (MISCELLANEOUS) ×2 IMPLANT
PENCIL HANDSWITCHING (ELECTRODE) ×2 IMPLANT
PENCIL SMOKE EVACUATOR (MISCELLANEOUS) ×1 IMPLANT
POUCH OSTOMY 2 3/4  H 3804 (WOUND CARE)
POUCH OSTOMY 2 3/4 H 3804 (WOUND CARE)
POUCH OSTOMY 2 PC DRNBL 2.25 (WOUND CARE) IMPLANT
POUCH OSTOMY 2 PC DRNBL 2.75 (WOUND CARE) IMPLANT
POUCH OSTOMY DRNBL 2 1/4 (WOUND CARE) ×2
POWDER SURGICEL 3.0 GRAM (HEMOSTASIS) ×1 IMPLANT
RELOAD LINEAR CUT PROX 55 BLUE (ENDOMECHANICALS) IMPLANT
RELOAD PROXIMATE 75MM BLUE (ENDOMECHANICALS) ×10 IMPLANT
RELOAD STAPLE 55 3.8 BLU REG (ENDOMECHANICALS) IMPLANT
RELOAD STAPLE 75 3.8 BLU REG (ENDOMECHANICALS) IMPLANT
RETRACTOR WND ALEXIS-O 25 LRG (MISCELLANEOUS) IMPLANT
RETRACTOR WOUND ALXS 18CM MED (MISCELLANEOUS) IMPLANT
RTRCTR WOUND ALEXIS O 18CM MED (MISCELLANEOUS)
RTRCTR WOUND ALEXIS O 25CM LRG (MISCELLANEOUS) ×2
SHEET LAVH (DRAPES) ×1 IMPLANT
SPONGE INTESTINAL PEANUT (DISPOSABLE) ×1 IMPLANT
SPONGE SURGIFOAM ABS GEL 100 (HEMOSTASIS) ×1 IMPLANT
SPONGE T-LAP 18X18 ~~LOC~~+RFID (SPONGE) ×4 IMPLANT
STAPLER CVD CUT GN 40 RELOAD (ENDOMECHANICALS) ×2 IMPLANT
STAPLER CVD CUT GRN 40 RELOAD (ENDOMECHANICALS) IMPLANT
STAPLER GUN LINEAR PROX 60 (STAPLE) ×3 IMPLANT
STAPLER PROXIMATE 55 BLUE (STAPLE) IMPLANT
STAPLER PROXIMATE 75MM BLUE (STAPLE) ×2 IMPLANT
STAPLER VISISTAT (STAPLE) ×2 IMPLANT
SUT CHROMIC 0 SH (SUTURE) IMPLANT
SUT CHROMIC 2 0 SH (SUTURE) IMPLANT
SUT CHROMIC 3 0 SH 27 (SUTURE) ×7 IMPLANT
SUT PDS AB 0 CTX 60 (SUTURE) ×2 IMPLANT
SUT PROLENE 2 0 SH 30 (SUTURE) ×1 IMPLANT
SUT SILK 2 0 (SUTURE)
SUT SILK 2-0 18XBRD TIE 12 (SUTURE) IMPLANT
SUT SILK 3 0 SH CR/8 (SUTURE) ×3 IMPLANT
TRAY FOLEY MTR SLVR 16FR STAT (SET/KITS/TRAYS/PACK) ×2 IMPLANT
YANKAUER SUCT BULB TIP 10FT TU (MISCELLANEOUS) ×2 IMPLANT

## 2021-03-26 NOTE — Transfer of Care (Signed)
Immediate Anesthesia Transfer of Care Note  Patient: Diana Fox  Procedure(s) Performed: PARTIAL COLECTOMY (Abdomen) COLOSTOMY (Abdomen) SMALL BOWEL RESECTION (Abdomen)  Patient Location: PACU  Anesthesia Type:General  Level of Consciousness: awake, alert , oriented and patient cooperative  Airway & Oxygen Therapy: Patient Spontanous Breathing and Patient connected to face mask oxygen  Post-op Assessment: Report given to RN, Post -op Vital signs reviewed and stable and Patient moving all extremities  Post vital signs: Reviewed and stable  Last Vitals:  Vitals Value Taken Time  BP 154/88 03/26/21 1119  Temp    Pulse 85 03/26/21 1124  Resp 17 03/26/21 1124  SpO2 100 % 03/26/21 1124  Vitals shown include unvalidated device data.  Last Pain:  Vitals:   03/26/21 0725  TempSrc: Oral  PainSc: 0-No pain         Complications: No notable events documented.

## 2021-03-26 NOTE — Op Note (Signed)
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  Biopsies were positive for adenocarcinoma.  The patient was also noted to have an elevated CEA level at 61.  The patient now presents for partial colectomy.  The risks and benefits of the procedure including bleeding, infection, cardiopulmonary difficulties, the possibility of needing a blood transfusion, and the possibility of a colostomy were fully explained to the patient, who gave informed consent.  Procedure note: The patient was placed in the supine position.  After induction of general endotracheal anesthesia, the abdomen was prepped and draped using the usual sterile technique with ChloraPrep.  Surgical site confirmation was performed.  A midline incision was made from just above the umbilicus to the suprapubic region.  The peritoneal cavity was entered into without difficulty.  The liver was palpated and no abnormal lesions were noted.  The anterior abdominal wall was inspected and no palpable nodules were seen.  The patient had a large fibroid uterus.  Just posterior to the uterus along the right pelvic brim, a large colonic mass which was attached to the posterior uterus and right fallopian tube and ovary was found.  There was masses within the mesentery and a single indenting nodule over the left ureter at the juncture of the iliac artery.  The small bowel was bluntly freed away and there were 2 areas of nodularity on the serosal surface.  Ultimately, a partial small bowel resection was performed.  This was in the terminal ileum.   A GIA 75 stapler was placed proximally and distally around the affected area of the small bowel.  The mesentery was divided using the LigaSure.  A side-to-side enteroenterostomy was performed using a GIA 75 stapler.  The enterotomy was closed using a TA 60 stapler.  The staple line was bolstered using 3-0 silk Lembert sutures.  The mesenteric defect was closed using a 3-0 Chromic Gut interrupted suture.  The colorectal mass was sharply and bluntly freed away from the pelvis.  There was obvious extension of tumor outside the colon to the pelvic brim.  I was not able to fully excise all of the tumor in the pelvis.  The right fallopian tube was removed and the ends cauterized using the LigaSure.  A GIA 75 stapler was placed across the mid sigmoid colon.  I did try to explore for the left ureter, but there was a significant amount of sclerotic reaction from the tumor down the mesentery to the left pelvis.  It was elected to proceed with resection of the mass after a contour TA 60 stapler was placed just distal to the tumorous mass along the rectum.  The mesentery was carefully divided using the LigaSure, care taken to avoid the ureter.  A suture was placed proximally for orientation purposes.  Specimen was then removed from the operative field.  A 2-0 Prolene suture was placed on the rectal stump.  The wound of the pelvis was copiously irrigated with normal saline.  Surgical powder, Surgicel and Gelfoam were all placed into the pelvis.  A colostomy was then formed along the left side of the abdomen  at the level of the umbilicus.  As stated earlier, obvious tumor was left behind in the pelvis.  The bowel was returned into the abdominal cavity in orderly fashion.  All operating personnel then changed gown and gloves.  The midline fascia was reapproximated using a looped 0 PDS running suture.  Exparel was instilled into the surrounding wound.  The skin was closed using staples.  This incision was then covered and the  colostomy was matured using 3-0 Chromic Gut interrupted sutures.  An appliance was then applied.  The midline incision was then covered using Betadine ointment and dry sterile dressing.  All tape and needle counts were correct at the end of the procedure.  Patient was extubated in the operating room and transferred to PACU in stable condition.  Dr. Okey Dupre was present throughout the procedure and was critical in helping with the dissection, resection, and colostomy maturation.    Complications: None  EBL: 100 cc  Specimen: Distal sigmoid colon, suture proximal, small bowel, right fallopian tube

## 2021-03-26 NOTE — Anesthesia Postprocedure Evaluation (Signed)
Anesthesia Post Note  Patient: Tonna Palazzi  Procedure(s) Performed: PARTIAL COLECTOMY (Abdomen) COLOSTOMY (Abdomen) SMALL BOWEL RESECTION (Abdomen)  Patient location during evaluation: PACU Anesthesia Type: General Level of consciousness: awake and alert, oriented and sedated Pain management: pain level controlled Vital Signs Assessment: post-procedure vital signs reviewed and stable Respiratory status: spontaneous breathing, nonlabored ventilation and respiratory function stable Cardiovascular status: blood pressure returned to baseline and stable Postop Assessment: no apparent nausea or vomiting Anesthetic complications: no   No notable events documented.   Last Vitals:  Vitals:   03/26/21 1230 03/26/21 1252  BP: (!) 156/85 (!) 143/88  Pulse: 98 100  Resp: 16 18  Temp:  36.9 C  SpO2: 100% 100%    Last Pain:  Vitals:   03/26/21 1252  TempSrc: Oral  PainSc:                  Shirline Kendle C Jaelynn Currier

## 2021-03-26 NOTE — Anesthesia Procedure Notes (Signed)
Procedure Name: Intubation Date/Time: 03/26/2021 8:52 AM Performed by: Myna Bright, CRNA Pre-anesthesia Checklist: Patient identified, Emergency Drugs available, Suction available and Patient being monitored Patient Re-evaluated:Patient Re-evaluated prior to induction Oxygen Delivery Method: Circle system utilized Preoxygenation: Pre-oxygenation with 100% oxygen Induction Type: IV induction Ventilation: Mask ventilation without difficulty Laryngoscope Size: Mac and 3 Grade View: Grade I Tube type: Oral Tube size: 7.0 mm Number of attempts: 1 Airway Equipment and Method: Stylet Placement Confirmation: ETT inserted through vocal cords under direct vision, positive ETCO2 and breath sounds checked- equal and bilateral Secured at: 21 cm Tube secured with: Tape Dental Injury: Teeth and Oropharynx as per pre-operative assessment

## 2021-03-26 NOTE — Consult Note (Signed)
[place] nurse consulted for new ostomy, patient from [place] today with new end colostomy.   Will plan for post op teaching to begin Monday.   [place], [place], [place]

## 2021-03-26 NOTE — Anesthesia Preprocedure Evaluation (Signed)
Anesthesia Evaluation  Patient identified by MRN, date of birth, ID band Patient awake    Reviewed: Allergy & Precautions, NPO status , Patient's Chart, lab work & pertinent test results  Airway Mallampati: II  TM Distance: >3 FB Neck ROM: Full    Dental  (+) Dental Advisory Given, Chipped, Missing,  Chipped teethx2:   Pulmonary neg pulmonary ROS,    Pulmonary exam normal breath sounds clear to auscultation       Cardiovascular Exercise Tolerance: Good hypertension, Pt. on medications Normal cardiovascular exam Rhythm:Regular Rate:Normal     Neuro/Psych negative neurological ROS  negative psych ROS   GI/Hepatic Neg liver ROS, GERD  Medicated,Colon cancer    Endo/Other  negative endocrine ROS  Renal/GU negative Renal ROS  negative genitourinary   Musculoskeletal negative musculoskeletal ROS (+)   Abdominal   Peds negative pediatric ROS (+)  Hematology negative hematology ROS (+) Blood dyscrasia, anemia ,   Anesthesia Other Findings Back pain  Reproductive/Obstetrics negative OB ROS                             Anesthesia Physical Anesthesia Plan  ASA: 3  Anesthesia Plan: General   Post-op Pain Management: Dilaudid IV   Induction: Intravenous  PONV Risk Score and Plan: 4 or greater and Ondansetron, Dexamethasone and Midazolam  Airway Management Planned: Oral ETT  Additional Equipment:   Intra-op Plan:   Post-operative Plan:   Informed Consent: I have reviewed the patients History and Physical, chart, labs and discussed the procedure including the risks, benefits and alternatives for the proposed anesthesia with the patient or authorized representative who has indicated his/her understanding and acceptance.     Dental advisory given  Plan Discussed with: CRNA and Surgeon  Anesthesia Plan Comments:         Anesthesia Quick Evaluation

## 2021-03-26 NOTE — Interval H&P Note (Signed)
History and Physical Interval Note:  03/26/2021 8:16 AM  Diana Fox  has presented today for surgery, with the diagnosis of Colon Cancer.  The various methods of treatment have been discussed with the patient and family. After consideration of risks, benefits and other options for treatment, the patient has consented to  Procedure(s): PARTIAL COLECTOMY (N/A) as a surgical intervention.  The patient's history has been reviewed, patient examined, no change in status, stable for surgery.  I have reviewed the patient's chart and labs.  Questions were answered to the patient's satisfaction.     Aviva Signs

## 2021-03-26 NOTE — Plan of Care (Signed)

## 2021-03-27 LAB — BASIC METABOLIC PANEL WITH GFR
Anion gap: 5 (ref 5–15)
BUN: 14 mg/dL (ref 6–20)
CO2: 25 mmol/L (ref 22–32)
Calcium: 8.2 mg/dL — ABNORMAL LOW (ref 8.9–10.3)
Chloride: 110 mmol/L (ref 98–111)
Creatinine, Ser: 0.7 mg/dL (ref 0.44–1.00)
GFR, Estimated: >60 mL/min (ref >60)
Glucose, Bld: 131 mg/dL — ABNORMAL HIGH (ref 70–99)
Potassium: 3.9 mmol/L (ref 3.5–5.1)
Sodium: 140 mmol/L (ref 135–145)

## 2021-03-27 LAB — CBC
HCT: 21.6 % — ABNORMAL LOW (ref 36.0–46.0)
Hemoglobin: 6.6 g/dL — CL (ref 12.0–15.0)
MCH: 25.6 pg — ABNORMAL LOW (ref 26.0–34.0)
MCHC: 30.6 g/dL (ref 30.0–36.0)
MCV: 83.7 fL (ref 80.0–100.0)
Platelets: 416 10*3/uL — ABNORMAL HIGH (ref 150–400)
RBC: 2.58 MIL/uL — ABNORMAL LOW (ref 3.87–5.11)
RDW: 18.5 % — ABNORMAL HIGH (ref 11.5–15.5)
WBC: 10.2 10*3/uL (ref 4.0–10.5)
nRBC: 0 % (ref 0.0–0.2)

## 2021-03-27 LAB — PHOSPHORUS: Phosphorus: 3.2 mg/dL (ref 2.5–4.6)

## 2021-03-27 LAB — MAGNESIUM: Magnesium: 1.7 mg/dL (ref 1.7–2.4)

## 2021-03-27 LAB — PREPARE RBC (CROSSMATCH)

## 2021-03-27 LAB — HEMOGLOBIN AND HEMATOCRIT, BLOOD
HCT: 28.7 % — ABNORMAL LOW (ref 36.0–46.0)
Hemoglobin: 9.2 g/dL — ABNORMAL LOW (ref 12.0–15.0)

## 2021-03-27 LAB — ABO/RH: ABO/RH(D): B POS

## 2021-03-27 MED ORDER — KCL IN DEXTROSE-NACL 20-5-0.45 MEQ/L-%-% IV SOLN: INTRAVENOUS | Status: DC

## 2021-03-27 MED ORDER — SODIUM CHLORIDE 0.9% IV SOLUTION: Freq: Once | INTRAVENOUS | Status: AC

## 2021-03-27 NOTE — Progress Notes (Signed)
1 Day Post-Op  Subjective: Patient denies any significant abdominal pain.  She has been able to void after her Foley was removed.  She states she is currently menstruating.  Objective: Vital Fox in last 24 hours: Temp:  [97.8 F (36.6 C)-98.9 F (37.2 C)] 98.9 F (37.2 C) (02/18 0931) Pulse Rate:  [84-112] 107 (02/18 0931) Resp:  [15-20] 16 (02/18 0931) BP: (113-165)/(68-90) 142/83 (02/18 0931) SpO2:  [98 %-100 %] 100 % (02/18 0258) Weight:  [70.4 kg] 70.4 kg (02/17 1252)    Intake/Output from previous day: 02/17 0701 - 02/18 0700 In: 4521.9 [P.O.:580; I.V.:3841.9; IV Piggyback:100] Out: 550 [Urine:450; Blood:100] Intake/Output this shift: Total I/O In: 272.9 [I.V.:272.9] Out: -   General appearance: alert, cooperative, and no distress Resp: clear to auscultation bilaterally Cardio: regular rate and rhythm, S1, S2 normal, no murmur, click, rub or gallop GI: Soft, incision healing well.  Colostomy pink and patent.  Minimal bowel sounds appreciated.  Lab Results:  Recent Labs    03/27/21 0716  WBC 10.2  HGB 6.6*  HCT 21.6*  PLT 416*   BMET Recent Labs    03/27/21 0716  NA 140  K 3.9  CL 110  CO2 25  GLUCOSE 131*  BUN 14  CREATININE 0.70  CALCIUM 8.2*   PT/INR No results for input(s): LABPROT, INR in the last 72 hours.  Studies/Results: No results found.  Anti-infectives: Anti-infectives (From admission, onward)    Start     Dose/Rate Route Frequency Ordered Stop   03/26/21 0730  ertapenem (INVANZ) 1,000 mg in sodium chloride 0.9 % 100 mL IVPB        1 g 200 mL/hr over 30 Minutes Intravenous On call to O.R. 03/26/21 0721 03/26/21 2112   03/26/21 0706  sodium chloride 0.9 % with ertapenem [facility]) ADS Med       Note to Pharmacy: Diana Fox S: cabinet override      03/26/21 0706 03/26/21 0858       Assessment/Plan: s/p Procedure(s): PARTIAL COLECTOMY COLOSTOMY SMALL BOWEL RESECTION Impression: Stable on postoperative day 1.  Patient has  had a drop in her hemoglobin from 10-6.6, which is secondary to acute surgical blood loss as well as her menstruating.  She has a significantly enlarged fibroid uterus.  She will receive 2 units of packed red blood cells.  Will advance to full liquid diet.  Enterostomal therapy nurse will see the patient tomorrow for instruction.  Patient fully aware of the diagnosis.  We will get her up to a chair.  Repeat labs ordered in a.m.  LOS: 1 day    Diana Fox 03/27/2021

## 2021-03-27 NOTE — Consult Note (Signed)
[place] Nurse ostomy consult note Notes reviewed.  Arrangements made to see patient in am.  I have communicated with the patient's Bedside Nurse L. Ronne Binning, and asked her to relay to family and patient that I will arrive between 10:30 and 11am tomorrow.  No function from ostomy at this time.   [place] nursing team will follow along with you, and will remain available to this patient, the nursing and medical teams.     Thanks, Maudie Flakes, MSN, RN, [place], Arther Abbott  Pager# [phone]

## 2021-03-28 LAB — CBC
HCT: 27.3 % — ABNORMAL LOW (ref 36.0–46.0)
Hemoglobin: 8.3 g/dL — ABNORMAL LOW (ref 12.0–15.0)
MCH: 25.9 pg — ABNORMAL LOW (ref 26.0–34.0)
MCHC: 30.4 g/dL (ref 30.0–36.0)
MCV: 85 fL (ref 80.0–100.0)
Platelets: 325 10*3/uL (ref 150–400)
RBC: 3.21 MIL/uL — ABNORMAL LOW (ref 3.87–5.11)
RDW: 17.2 % — ABNORMAL HIGH (ref 11.5–15.5)
WBC: 10.7 10*3/uL — ABNORMAL HIGH (ref 4.0–10.5)
nRBC: 0 % (ref 0.0–0.2)

## 2021-03-28 LAB — BASIC METABOLIC PANEL
Anion gap: 4 — ABNORMAL LOW (ref 5–15)
BUN: 8 mg/dL (ref 6–20)
CO2: 27 mmol/L (ref 22–32)
Calcium: 8.2 mg/dL — ABNORMAL LOW (ref 8.9–10.3)
Chloride: 108 mmol/L (ref 98–111)
Creatinine, Ser: 0.66 mg/dL (ref 0.44–1.00)
GFR, Estimated: >60 mL/min (ref >60)
Glucose, Bld: 108 mg/dL — ABNORMAL HIGH (ref 70–99)
Potassium: 3.7 mmol/L (ref 3.5–5.1)
Sodium: 139 mmol/L (ref 135–145)

## 2021-03-28 LAB — PHOSPHORUS: Phosphorus: 2.1 mg/dL — ABNORMAL LOW (ref 2.5–4.6)

## 2021-03-28 LAB — MAGNESIUM: Magnesium: 1.8 mg/dL (ref 1.7–2.4)

## 2021-03-28 MED ORDER — POTASSIUM & SODIUM PHOSPHATES 280-160-250 MG PO PACK
1.0000 | PACK | Freq: Three times a day (TID) | ORAL | Status: AC
  Administered 2021-03-28 (×3): 1 via ORAL
  Filled 2021-03-28 (×3): qty 1

## 2021-03-28 NOTE — Consult Note (Signed)
Diana Fox Nurse ostomy consult note: POD 2  Stoma type/location: LLQ colostomy created emergently on 03/26/21 by Dr. Prescott Gum assessment/size: 1 and 1/2 inch inches red, round, slightly elevated, mildly edematous. Lumen in center. Peristomal assessment: clear. Abdomen is distended. Honeycomb dressing is removed and incision is closely approximated with staples, no drainage. I apply a dry gauze dressing and secure with Medipore tape. Treatment options for stomal/peristomal skin: skin barrier ring Output : Scant serosanguinous effluent. No stool, no flatus. Patient has walked to bathroom, but not ambulated in hallways.  This is to occur this afternoon according to Bedside RN. Ostomy pouching: 2pc. 2 and 3/4 inch ostomy pouching system with skin barrier ring  Education provided:  Extended session with husband, Diana Fox. Patient is sleeping and awakens to stimuli, but returns to sleep and does not awaken for education or pouch change procedure. I consider that this may be a coping mechanism at this time. Explained role of ostomy nurse and creation of stoma  Explained stoma characteristics (budded, flush, color, texture, care) Demonstrated pouch change (cutting new skin barrier, measuring stoma, cleaning peristomal skin and stoma, use of barrier ring) Education on emptying when 1/3 to 1/2 full and how to empty. Patient's husband Diana Fox is able to give a return demonstration of performing opening and closing of pouch using the Lock and Roll closure. Demonstrated use of toilet paper wick to clean spout  Discussed bathing, diet, gas, medication use, constipation  Answered husband's questions:  Patient is able to shower and bather with pouch on or off as soon as Dr. Arnoldo Morale says it is all right. Spouse is asking about gloves for performing ostomy care for his wife at home.  Discussed outpatient ostomy Clinic at [facility]. Husband states that his wife may be interested in this. Flyer left in  [employer].  Recommend HHRN for follow up on education and support post discharge with a new ostomy.  Also recommend refer to the outpatient ostomy clinic at [facility] for continued support and follow up particularly post [facility] discharge and as things change with her ongoing oncology needs.   Supplies: 2 pouches, 2 skin barriers and 2 skin barrier rings, an educational folder, sizing guide and a 1-piece, step-by-step instruction guide for removing and changing a pouch in room.    Enrolled patient in [place] program: Yes, today. Opaque pouches and closed end pouch samples are provided for their use as they explore needs in the post op period.  NEXT VISIT:  I will plan to return at [place] on Tuesday, 03/30/21.  Husband is to return to work tomorrow and will leave work at [employer] on Tuesday to meet me at bedside at [place] for another practice pouch change session. I anticipate more involvement with patient at that time.  [place] nursing team will follow along with you, will remain available to this patient, the nursing and medical teams.    Thanks, Diana Flakes, MSN, RN, Diana Fox, Diana Fox  Pager# [phone]

## 2021-03-28 NOTE — Progress Notes (Signed)
Patient ambulated in the hallway this evening and did great!

## 2021-03-28 NOTE — Progress Notes (Signed)
[facility] Progress Note  2 Days Post-Op  Subjective: Patient seen and examined.  She is resting comfortably in bed.  She has some abdominal soreness, but is doing well otherwise.  She is tolerating full liquids without nausea and vomiting.  She denies any flatus or stool into her ostomy bag.  Objective: Vital signs in last 24 hours: Temp:  [97.7 F (36.5 C)-98.6 F (37 C)] 98.6 F (37 C) (02/19 0517) Pulse Rate:  [102-125] 103 (02/19 0517) Resp:  [15-18] 16 (02/19 0517) BP: (132-149)/(82-84) 149/83 (02/19 0517) SpO2:  [98 %-100 %] 98 % (02/19 0517) Last BM Date : 03/24/21  Intake/Output from previous day: 02/18 0701 - 02/19 0700 In: 3066.2 [P.O.:960; I.V.:1013.5; Blood:1092.7] Out: -  Intake/Output this shift: No intake/output data recorded.  General appearance: alert, cooperative, and no distress GI: Soft, nondistended, no percussion tenderness, mild lower abdominal and left-sided tenderness to palpation, no rigidity, guarding, rebound tenderness; incision C/D/I with dressing in place, ostomy pink and patent with bowel sweat in bag, minimal old dried blood strikethrough  Lab Results:  Recent Labs    03/27/21 0716 03/27/21 1531 03/28/21 0431  WBC 10.2  --  10.7*  HGB 6.6* 9.2* 8.3*  HCT 21.6* 28.7* 27.3*  PLT 416*  --  325   BMET Recent Labs    03/27/21 0716 03/28/21 0431  NA 140 139  K 3.9 3.7  CL 110 108  CO2 25 27  GLUCOSE 131* 108*  BUN 14 8  CREATININE 0.70 0.66  CALCIUM 8.2* 8.2*   PT/INR No results for input(s): LABPROT, INR in the last 72 hours.  Studies/Results: No results found.  Anti-infectives: Anti-infectives (From admission, onward)    Start     Dose/Rate Route Frequency Ordered Stop   03/26/21 0730  ertapenem (INVANZ) 1,000 mg in sodium chloride 0.9 % 100 mL IVPB        1 g 200 mL/hr over 30 Minutes Intravenous On call to O.R. 03/26/21 0721 03/26/21 2112   03/26/21 0706  sodium chloride 0.9 % with ertapenem [facility])  ADS Med       Note to Pharmacy: Abbie Sons S: cabinet override      03/26/21 0706 03/26/21 0858       Assessment/Plan:  Patient is a 51 year old female who who was admitted status post partial colectomy with end colostomy and small bowel resection for colonic adenocarcinoma.  -Patient doing well postoperatively -FLD, will maintain diet at this time given no ostomy output since surgery -Continue Entereg -Continue to monitor for ostomy output -Encouraged ambulation -Continue IV fluids -Phosphorus replacement per pharmacy -PRN pain medications and antiemetics -Lovenox and SCDs for DVT prophylaxis   LOS: 2 days    Courtlynn Holloman A Mehreen Azizi 03/28/2021

## 2021-03-28 NOTE — Progress Notes (Signed)
Pt has liquid dark brown stool in ostomy pouch after ambulating. First since surgery

## 2021-03-28 NOTE — Progress Notes (Signed)
MEDICATION RELATED CONSULT NOTE - INITIAL   Pharmacy Consult for Phos replacement Indication: hypophosphatemia  Allergies  Allergen Reactions   Azithromycin Hives    infusing arm began to swell , her face got red, and she broke out, there are bumps around where the IV site was infusing   Motrin [Ibuprofen] Itching   Lisinopril     cough   Sudafed [Pseudoephedrine Hcl] Other (See Comments)    Hypertension   Patient Measurements: Height: 5\' 5"  (165.1 cm) Weight: 70.4 kg (155 lb 4.8 oz) IBW/kg (Calculated) : 57  Vital Signs: Temp: 98.6 F (37 C) (02/19 0517) BP: 149/83 (02/19 0517) Pulse Rate: 103 (02/19 0517) Intake/Output from previous day: 02/18 0701 - 02/19 0700 In: 3066.2 [P.O.:960; I.V.:1013.5; Blood:1092.7] Out: -  Intake/Output from this shift: No intake/output data recorded.  Labs: Recent Labs    03/27/21 0716 03/27/21 1531 03/28/21 0431  WBC 10.2  --  10.7*  HGB 6.6* 9.2* 8.3*  HCT 21.6* 28.7* 27.3*  PLT 416*  --  325  CREATININE 0.70  --  0.66  MG 1.7  --  1.8  PHOS 3.2  --  2.1*   Estimated Creatinine Clearance: 82.9 mL/min (by C-G formula based on SCr of 0.66 mg/dL).  Medical History: Past Medical History:  Diagnosis Date   Anemia    Back pain    Hypertension    Medications:  Scheduled:   alvimopan  12 mg Oral BID   Chlorhexidine Gluconate Cloth  6 each Topical Daily   enoxaparin (LOVENOX) injection  40 mg Subcutaneous Q24H   fluticasone  2 spray Each Nare Daily   nebivolol  10 mg Oral Daily   potassium & sodium phosphates  1 packet Oral TID WC & HS   Assessment: Patient is a 51 year old female who who was admitted status post partial colectomy with end colostomy and small bowel resection for colonic adenocarcinoma.  Pharmacy asked to treat hypophosphatemia. (Phos 2.1)  Pt is taking and tolerating PO's.   Goal of Therapy:  Replace to WNL  Plan:  Potassium and Sodium phosphates 1 pckt po tid x 3 doses today.  F/U labs, phos in  am  Hart Robinsons A 03/28/2021,11:31 AM

## 2021-03-29 ENCOUNTER — Other Ambulatory Visit: Payer: Self-pay

## 2021-03-29 LAB — COMPREHENSIVE METABOLIC PANEL
ALT: 8 U/L (ref 0–44)
AST: 10 U/L — ABNORMAL LOW (ref 15–41)
Albumin: 2.7 g/dL — ABNORMAL LOW (ref 3.5–5.0)
Alkaline Phosphatase: 39 U/L (ref 38–126)
Anion gap: 7 (ref 5–15)
BUN: <5 mg/dL — ABNORMAL LOW (ref 6–20)
CO2: 26 mmol/L (ref 22–32)
Calcium: 8.4 mg/dL — ABNORMAL LOW (ref 8.9–10.3)
Chloride: 105 mmol/L (ref 98–111)
Creatinine, Ser: 0.62 mg/dL (ref 0.44–1.00)
GFR, Estimated: >60 mL/min (ref >60)
Glucose, Bld: 105 mg/dL — ABNORMAL HIGH (ref 70–99)
Potassium: 3.3 mmol/L — ABNORMAL LOW (ref 3.5–5.1)
Sodium: 138 mmol/L (ref 135–145)
Total Bilirubin: 0.5 mg/dL (ref 0.3–1.2)
Total Protein: 6 g/dL — ABNORMAL LOW (ref 6.5–8.1)

## 2021-03-29 LAB — TYPE AND SCREEN
ABO/RH(D): B POS
Antibody Screen: NEGATIVE
Unit division: 0
Unit division: 0

## 2021-03-29 LAB — BPAM RBC
Blood Product Expiration Date: 202303242359
Blood Product Expiration Date: 202303242359
ISSUE DATE / TIME: 202302181036
ISSUE DATE / TIME: 202302181201
Unit Type and Rh: 5100
Unit Type and Rh: 5100

## 2021-03-29 LAB — CBC
HCT: 28.2 % — ABNORMAL LOW (ref 36.0–46.0)
Hemoglobin: 8.6 g/dL — ABNORMAL LOW (ref 12.0–15.0)
MCH: 26.1 pg (ref 26.0–34.0)
MCHC: 30.5 g/dL (ref 30.0–36.0)
MCV: 85.7 fL (ref 80.0–100.0)
Platelets: 361 10*3/uL (ref 150–400)
RBC: 3.29 MIL/uL — ABNORMAL LOW (ref 3.87–5.11)
RDW: 16.6 % — ABNORMAL HIGH (ref 11.5–15.5)
WBC: 8.7 10*3/uL (ref 4.0–10.5)
nRBC: 0 % (ref 0.0–0.2)

## 2021-03-29 LAB — PHOSPHORUS: Phosphorus: 3.3 mg/dL (ref 2.5–4.6)

## 2021-03-29 MED ORDER — POTASSIUM CHLORIDE CRYS ER 20 MEQ PO TBCR
20.0000 meq | EXTENDED_RELEASE_TABLET | Freq: Three times a day (TID) | ORAL | Status: DC
  Administered 2021-03-29 – 2021-03-30 (×3): 20 meq via ORAL
  Filled 2021-03-29 (×3): qty 1

## 2021-03-29 NOTE — TOC Initial Note (Signed)
Transition of Care [facility]) - Initial/Assessment Note    Patient Details  Name: Diana Fox MRN: [id number] Date of Birth: 15-Apr-1970  [facility] CM/SW Contact:    Salome Arnt, [place] Phone Number: 03/29/2021, 11:50 AM  Clinical Narrative:  Pt s/p partial colectomy. Assessment completed due to high risk readmission score. LCSW completed assessment with pt. She reports she lives with her husband but he works during the day. She is independent with ADLs. Discussed d/c needs with MD who requests RN for new colostomy. Pt agreeable. She reports her husband has been observing colostomy care in hospital. LCSW referred to [place], [facility], [place], [place], [place], [place], [place], and [place]. All declined, except [facility] who hasn't responded at this time. TOC will continue to work on home health RN.                  Expected Discharge Plan: [place] Barriers to Discharge: Continued Medical Work up   Patient Goals and CMS Choice Patient states their goals for this hospitalization and ongoing recovery are:: return home   Choice offered to / list presented to : Patient  Expected Discharge Plan and Services Expected Discharge Plan: [place] In-house Referral: Clinical Social Work   Post Acute Care Choice: [place] arrangements for the past 2 months: [place]                                      Prior Living Arrangements/Services Living arrangements for the past 2 months: Single Family Home Lives with:: Spouse Patient language and need for interpreter reviewed:: Yes Do you feel safe going back to the place where you live?: Yes            Criminal Activity/Legal Involvement Pertinent to Current Situation/Hospitalization: No - Comment as needed  Activities of Daily Living Home Assistive Devices/Equipment: Eyeglasses ADL Screening (condition at time of admission) Patient's cognitive  ability adequate to safely complete daily activities?: Yes Is the patient deaf or have difficulty hearing?: No Does the patient have difficulty seeing, even when wearing glasses/contacts?: No Does the patient have difficulty concentrating, remembering, or making decisions?: No Patient able to express need for assistance with ADLs?: Yes Does the patient have difficulty dressing or bathing?: No Independently performs ADLs?: Yes (appropriate for developmental age) Does the patient have difficulty walking or climbing stairs?: No Weakness of Legs: None Weakness of Arms/Hands: None  Permission Sought/Granted                  Emotional Assessment   Attitude/Demeanor/Rapport: Engaged Affect (typically observed): Accepting Orientation: : Oriented to Self, Oriented to Place, Oriented to  Time, Oriented to Situation Alcohol / Substance Use: Not Applicable Psych Involvement: No (comment)  Admission diagnosis:  S/P partial colectomy [Z90.49] Patient Active Problem List   Diagnosis Date Noted   S/P partial colectomy 03/26/2021   Colon carcinoma ([place])    History of colitis 02/04/2021   Hypokalemia 01/13/2021   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   Intra-abdominal abscess ([place]) 01/12/2021   Iron deficiency anemia    Leukocytosis 01/11/2021   Hypoalbuminemia 01/11/2021   Essential hypertension 01/11/2021   Atelectasis 01/11/2021   Colitis    PCP:  Caryl Bis, MD Pharmacy:   [id number] - , [place] [address] [place] [address] Phone: [phone]  Fax: [phone]     Social Determinants of Health (SDOH) Interventions    Readmission Risk Interventions Readmission Risk Prevention Plan 03/29/2021  Transportation Screening Complete  Home Care Screening Complete  Medication Review (RN CM) Complete  Some recent data might be hidden

## 2021-03-29 NOTE — Progress Notes (Signed)
MEDICATION RELATED CONSULT NOTE - INITIAL   Pharmacy Consult for Phos replacement Indication: hypophosphatemia  Allergies  Allergen Reactions   Azithromycin Hives    infusing arm began to swell , her face got red, and she broke out, there are bumps around where the IV site was infusing   Motrin [Ibuprofen] Itching   Lisinopril     cough   Sudafed [Pseudoephedrine Hcl] Other (See Comments)    Hypertension   Patient Measurements: Height: 5\' 5"  (165.1 cm) Weight: 70.4 kg (155 lb 4.8 oz) IBW/kg (Calculated) : 57  Vital Signs: Temp: 98.3 F (36.8 C) (02/20 0424) Temp Source: Oral (02/20 0424) BP: 147/99 (02/20 0424) Pulse Rate: 98 (02/20 0424) Intake/Output from previous day: 02/19 0701 - 02/20 0700 In: 1520 [P.O.:1520] Out: 850 [Stool:850] Intake/Output from this shift: Total I/O In: 0  Out: 50 [Stool:50]  Labs: Recent Labs    03/27/21 0716 03/27/21 1531 03/28/21 0431 03/29/21 0449 03/29/21 1118  WBC 10.2  --  10.7* 8.7  --   HGB 6.6* 9.2* 8.3* 8.6*  --   HCT 21.6* 28.7* 27.3* 28.2*  --   PLT 416*  --  325 361  --   CREATININE 0.70  --  0.66 0.62  --   MG 1.7  --  1.8  --   --   PHOS 3.2  --  2.1*  --  3.3  ALBUMIN  --   --   --  2.7*  --   PROT  --   --   --  6.0*  --   AST  --   --   --  10*  --   ALT  --   --   --  8  --   ALKPHOS  --   --   --  39  --   BILITOT  --   --   --  0.5  --     Estimated Creatinine Clearance: 82.9 mL/min (by C-G formula based on SCr of 0.62 mg/dL).  Medical History: Past Medical History:  Diagnosis Date   Anemia    Back pain    Hypertension    Medications:  Scheduled:   Chlorhexidine Gluconate Cloth  6 each Topical Daily   enoxaparin (LOVENOX) injection  40 mg Subcutaneous Q24H   fluticasone  2 spray Each Nare Daily   nebivolol  10 mg Oral Daily   potassium chloride  20 mEq Oral TID   AssessmentPlan Patient is a 51 year old female who who was admitted status post partial colectomy with end colostomy and small bowel  resection for colonic adenocarcinoma.  Pharmacy asked to treat hypophosphatemia. (Phos 2.1)  Pt is taking and tolerating PO's.   Phos 3.3 WNL, will sign off Thanks for the opportunity to participate in the care of this patient  Isac Sarna, BS Vena [place], BCPS Clinical Pharmacist Pager [phone]  03/29/2021,11:42 AM

## 2021-03-29 NOTE — Progress Notes (Signed)
Emptied patients ostomy twice during shift. First occurrence at 2132 with 148mL output. Second occurrence at 0400 with 746mL output. Both occurrences stool was liquid and black with green tent. Second occurrence was after ambulating in the room. Patient's husband observed how to empty ostomy.

## 2021-03-29 NOTE — Progress Notes (Signed)
3 Days Post-Op  Subjective: Patient's ostomy is working.  Patient fatigued but and not significant incisional pain.  Tolerating full liquid diet well.  Objective: Vital signs in last 24 hours: Temp:  [98.3 F (36.8 C)-99.3 F (37.4 C)] 98.3 F (36.8 C) (02/20 0424) Pulse Rate:  [98-117] 98 (02/20 0424) Resp:  [18-20] 18 (02/20 0424) BP: (143-152)/(86-99) 147/99 (02/20 0424) SpO2:  [99 %-100 %] 99 % (02/20 0424) Last BM Date : 03/24/21 (Has Ostomy now)  Intake/Output from previous day: 02/19 0701 - 02/20 0700 In: 1520 [P.O.:1520] Out: 850 [Stool:850] Intake/Output this shift: Total I/O In: 0  Out: 50 [Stool:50]  General appearance: alert, cooperative, and no distress Resp: clear to auscultation bilaterally Cardio: regular rate and rhythm, S1, S2 normal, no murmur, click, rub or gallop GI: Soft, incision healing well.  Colostomy pink and patent.  Lab Results:  Recent Labs    03/28/21 0431 03/29/21 0449  WBC 10.7* 8.7  HGB 8.3* 8.6*  HCT 27.3* 28.2*  PLT 325 361   BMET Recent Labs    03/28/21 0431 03/29/21 0449  NA 139 138  K 3.7 3.3*  CL 108 105  CO2 27 26  GLUCOSE 108* 105*  BUN 8 <5*  CREATININE 0.66 0.62  CALCIUM 8.2* 8.4*   PT/INR No results for input(s): LABPROT, INR in the last 72 hours.  Studies/Results: No results found.  Anti-infectives: Anti-infectives (From admission, onward)    Start     Dose/Rate Route Frequency Ordered Stop   03/26/21 0730  ertapenem (INVANZ) 1,000 mg in sodium chloride 0.9 % 100 mL IVPB        1 g 200 mL/hr over 30 Minutes Intravenous On call to O.R. 03/26/21 0721 03/26/21 2112   03/26/21 0706  sodium chloride 0.9 % with ertapenem [facility]) ADS Med       Note to Pharmacy: Abbie Sons S: cabinet override      03/26/21 0706 03/26/21 0858       Assessment/Plan: s/p Procedure(s): PARTIAL COLECTOMY COLOSTOMY SMALL BOWEL RESECTION Imp: Patient doing well on postoperative day 3.  Hypophosphatemia is being  addressed by pharmacy.  Her bowel function has returned and we will advance her to a regular diet.  Anticipate discharge in next 24 to 48 hours if she tolerates a regular diet well.  LOS: 3 days    Aviva Signs 03/29/2021

## 2021-03-30 ENCOUNTER — Encounter (HOSPITAL_COMMUNITY): Payer: Self-pay | Admitting: General Surgery

## 2021-03-30 ENCOUNTER — Other Ambulatory Visit (INDEPENDENT_AMBULATORY_CARE_PROVIDER_SITE_OTHER): Payer: Medicaid Other | Admitting: General Surgery

## 2021-03-30 DIAGNOSIS — Z09 Encounter for follow-up examination after completed treatment for conditions other than malignant neoplasm: Secondary | ICD-10-CM

## 2021-03-30 DIAGNOSIS — Z933 Colostomy status: Secondary | ICD-10-CM

## 2021-03-30 LAB — BASIC METABOLIC PANEL
Anion gap: 7 (ref 5–15)
BUN: 5 mg/dL — ABNORMAL LOW (ref 6–20)
CO2: 24 mmol/L (ref 22–32)
Calcium: 8.4 mg/dL — ABNORMAL LOW (ref 8.9–10.3)
Chloride: 106 mmol/L (ref 98–111)
Creatinine, Ser: 0.7 mg/dL (ref 0.44–1.00)
GFR, Estimated: >60 mL/min (ref >60)
Glucose, Bld: 98 mg/dL (ref 70–99)
Potassium: 3.5 mmol/L (ref 3.5–5.1)
Sodium: 137 mmol/L (ref 135–145)

## 2021-03-30 LAB — CBC
HCT: 29.2 % — ABNORMAL LOW (ref 36.0–46.0)
Hemoglobin: 8.8 g/dL — ABNORMAL LOW (ref 12.0–15.0)
MCH: 25.5 pg — ABNORMAL LOW (ref 26.0–34.0)
MCHC: 30.1 g/dL (ref 30.0–36.0)
MCV: 84.6 fL (ref 80.0–100.0)
Platelets: 418 10*3/uL — ABNORMAL HIGH (ref 150–400)
RBC: 3.45 MIL/uL — ABNORMAL LOW (ref 3.87–5.11)
RDW: 16.4 % — ABNORMAL HIGH (ref 11.5–15.5)
WBC: 8.9 10*3/uL (ref 4.0–10.5)
nRBC: 0 % (ref 0.0–0.2)

## 2021-03-30 LAB — PHOSPHORUS: Phosphorus: 3.9 mg/dL (ref 2.5–4.6)

## 2021-03-30 MED ORDER — OXYCODONE-ACETAMINOPHEN 5-325 MG PO TABS: 1.0000 | ORAL_TABLET | ORAL | 0 refills | Status: DC | PRN

## 2021-03-30 NOTE — Discharge Summary (Signed)
Physician Discharge Summary  Patient ID: Diana Fox MRN: [id number] DOB/AGE: 1970/04/08 51 y.o.  Admit date: 03/26/2021 Discharge date: 03/30/2021  Admission Diagnoses: Colorectal carcinoma  Discharge Diagnoses: Same, T4, N2, metastatic small bowel, poorly differentiated adenocarcinoma Principal Problem:   S/P partial colectomy Active Problems:   Colon carcinoma ([place]) Hypertension  Discharged Condition: good  Hospital Course: Patient is a 51 year old black female who was referred to my care for a near obstructing colorectal carcinoma.  She underwent Hartman's procedure, partial small bowel resection on 03/26/2021.  She was found to have an invasive distal sigmoid colorectal carcinoma with extension to the small bowel and into areas and circumferential extension to the pelvis with multiple satellite lesions present in the pelvis.  Not all the tumor could be removed due to its extensive invasion into surrounding structures.  She was also noted to have a very large fibroid uterus.  She tolerated the surgery well.  Her postoperative course was remarkable for acute surgical blood loss on top of chronic anemia which was addressed with 3 units of packed red blood cells.  She was also menstruating.  She did receive some supplementation for her hypophosphatemia.  Final pathology revealed a poorly differentiated adenocarcinoma with 9 out of 9 lymph nodes positive, satellite lesions involving the small bowel which was resected, and involved margins.  Patient is aware of the diagnosis.  She did receive instruction on how to take care of her colostomy.  The patient is being discharged home on 03/30/2021 in good and improving condition.  Treatments: surgery: Hartman's procedure, partial small bowel resection on 03/26/2021  Discharge Exam: Blood pressure (!) 149/87, pulse 95, temperature 98.3 F (36.8 C), temperature source Oral, resp. rate 18, height 5\' 5"  (1.651 m), weight 70.4 kg, SpO2 99 %. General  appearance: alert, cooperative, and no distress Resp: clear to auscultation bilaterally Cardio: regular rate and rhythm, S1, S2 normal, no murmur, click, rub or gallop GI: Soft, incision healing well.  Colostomy pink and patent.  Disposition: Discharge disposition: 01-Home or Self Care       Discharge Instructions     Diet - low sodium heart healthy   Complete by: As directed    Increase activity slowly   Complete by: As directed       Allergies as of 03/30/2021       Reactions   Azithromycin Hives   infusing arm began to swell , her face got red, and she broke out, there are bumps around where the IV site was infusing   Motrin [ibuprofen] Itching   Lisinopril    cough   Sudafed [pseudoephedrine Hcl] Other (See Comments)   Hypertension        Medication List     STOP taking these medications    metroNIDAZOLE 500 MG tablet Commonly known as: Flagyl   neomycin 500 MG tablet Commonly known as: MYCIFRADIN       TAKE these medications    Cholecalciferol 50 MCG (2000 UT) Tabs Take 2,000 Units by mouth daily.   ferrous sulfate 325 (65 FE) MG tablet TAKE 325 MG BY MOUTH DAILY. What changed: See the new instructions.   fexofenadine 180 MG tablet Commonly known as: ALLEGRA Take 1 tablet (180 mg total) by mouth daily.   fluticasone 50 MCG/ACT nasal spray Commonly known as: FLONASE Place 2 sprays into both nostrils daily.   lactose free nutrition Liqd Take 237 mLs by mouth daily.   medroxyPROGESTERone 5 MG tablet Commonly known as: PROVERA Take 5 mg by  mouth daily.   multivitamin tablet Take 1 tablet by mouth daily.   nebivolol 10 MG tablet Commonly known as: Bystolic Take 1 tablet (10 mg total) by mouth daily.   oxyCODONE-acetaminophen 5-325 MG tablet Commonly known as: Percocet Take 1 tablet by mouth every 4 (four) hours as needed for severe pain.   pantoprazole 40 MG tablet Commonly known as: PROTONIX Take 40 mg by mouth daily as needed  (acid reflux).        Follow-up Information     Aviva Signs, MD. Schedule an appointment as soon as possible for a visit on 04/06/2021.   Specialty: General Surgery Contact information: [id number] [address] [phone]                 Signed: Aviva Signs 03/30/2021, 8:16 AM

## 2021-03-30 NOTE — Consult Note (Addendum)
[facility] Nurse ostomy follow up: POD 4 Stoma type/location: LLQ colostomy  Stomal assessment/size: 1 and 5/8 inches round, slightly raised, lumen in center Peristomal assessment: intact Treatment options for stomal/peristomal skin: skin barrier ring Output: thin green effluent Ostomy pouching: 2pc. 2 and 3/4 inch ostomy pouching system with skin barrier ring Education provided:    Husband participated in pouch change (cutting new skin barrier, measuring stoma, cleaning peristomal skin and stoma, use of barrier ring) Husband Gwyndolyn Saxon gave return demonstration of tracing pattern on back of skin barrier, cutting out skin barrier.  He stretches out the skin barrier ring and watches as I tear and readjust size as it was too large. He places the skin barrier over the ring.  PATIENT removed the tape from each side of the skin barrier. Husband attaches pouch to skin barrier and closes bottom using the Lock and Roll closure.  The PATIENT also performs the Oaks and Roll closure mechanism and demonstrates how to both open and close pouch. They understand that pouch changes are twice weekly and that emptying the pouch is something that will be done several times a day-when it is 1/3 to 1/2 full.   They decide to change pouch on Friday and are hopeful that they will see Domenic Moras, [place] Nurse, FNP in the [facility] on Monday.  Patient understands that while her husband is going to assist her with changes for now, she will eventually take over this as she recovers from her surgery. He is taught that she will have to empty the pouch when her husband is at work.  Patient expresses that she will go to "whatever appointments that her medical team advise", but that she believes she "is healed from her cancer".   Demonstrates understanding of the use of wick to clean spout with toilet paper.  Patient has not been picked up by any HHAs. She will follow up in the outpatient ostomy clinic.  Supplies sent  home with patient: 6 pouches, 6 skin barriers, 6 rings.  Answered patient/family questions.   Enrolled patient in [place] Start Discharge program: Yes.  [facility] nursing team will follow, and will remain available to this patient, the nursing and medical teams.   Thanks, Maudie Flakes, MSN, RN, [place], Arther Abbott  Pager# [phone]

## 2021-03-30 NOTE — Progress Notes (Signed)
AVS given and explained to patient and family. Ostomy nurse gave a courtesy call for running a little late. Pt and family made aware.

## 2021-03-30 NOTE — Plan of Care (Signed)

## 2021-03-30 NOTE — TOC Transition Note (Signed)
Transition of Care [facility]) - CM/SW Discharge Note   Patient Details  Name: Diana Fox Name MRN: [id number] Date of Birth: 01/25/1971  [facility] of [facility]) CM/SW Contact:  Salome Arnt, LCSW Phone Number: 03/30/2021, 10:08 AM   Clinical Narrative:  Pt d/c today. LCSW notified pt and MD that no home health agency would accept due to insurance. Discussed referral to outpatient ostomy clinic in [place]. Pt is agreeable. Appointment scheduled for Monday, 2/27 at 12:00. Pt updated and flyer provided to pt with more information and where to park to enter hospital. MD aware to enter order for ambulatory referral to ostomy clinic. Pt reports ostomy supplies are in room. Wound RN to meet with pt today prior to d/c. No other needs reported.      Final next level of care: Home/Self Care Barriers to Discharge: Barriers Resolved   Patient Goals and CMS Choice Patient states their goals for this hospitalization and ongoing recovery are:: return home   Choice offered to / list presented to : Patient  Discharge Placement                  Name of family member notified: pt and husband Patient and family notified of of transfer: 03/30/21  Discharge Plan and Services In-house Referral: Clinical Social Work   Post Acute Care Choice: Home Health                               Social Determinants of Health (SDOH) Interventions     Readmission Risk Interventions Readmission Risk Prevention Plan 03/29/2021  Transportation Screening Complete  Home Care Screening Complete  Medication Review (RN CM) Complete  Some recent data might be hidden

## 2021-03-30 NOTE — Progress Notes (Signed)
Ostomy clinic referral

## 2021-03-31 LAB — SURGICAL PATHOLOGY

## 2021-04-02 ENCOUNTER — Telehealth: Payer: Self-pay | Admitting: *Deleted

## 2021-04-02 MED ORDER — ONDANSETRON HCL 4 MG PO TABS: 4.0000 mg | ORAL_TABLET | Freq: Three times a day (TID) | ORAL | 0 refills | Status: DC | PRN

## 2021-04-02 NOTE — Telephone Encounter (Signed)
Received call from patient.   Reports that she is having increased nausea. Patient reports that she was having dry heaves as of last night, but did not actively vomit. States that she is eating her normal diet, and is noticing both gas and stool in bag. States that stool remains liquid and light brown in color.  Advised to use BRAT diet and to advance as tolerated. Zofran sent to pharmacy for nausea.   Advised if patient unable to keep anything down or if there is no collection of gas or stool in bag >6hours, go to ER for evaluation.

## 2021-04-05 ENCOUNTER — Telehealth: Payer: Self-pay | Admitting: *Deleted

## 2021-04-05 ENCOUNTER — Ambulatory Visit (HOSPITAL_COMMUNITY): Payer: Medicaid Other

## 2021-04-05 NOTE — Telephone Encounter (Signed)
Received call from patient.   Reports burning with urination x1 day. Denies fever/ chills.   Advised to contact PCP for possible UTI. Verbalized understanding.

## 2021-04-06 ENCOUNTER — Encounter: Payer: Self-pay | Admitting: General Surgery

## 2021-04-06 ENCOUNTER — Encounter (HOSPITAL_COMMUNITY): Payer: Self-pay

## 2021-04-06 ENCOUNTER — Ambulatory Visit (INDEPENDENT_AMBULATORY_CARE_PROVIDER_SITE_OTHER): Payer: Medicaid Other | Admitting: General Surgery

## 2021-04-06 ENCOUNTER — Other Ambulatory Visit: Payer: Self-pay

## 2021-04-06 VITALS — BP 121/78 | HR 108 | Temp 98.4°F | Resp 16 | Ht 65.0 in | Wt 155.0 lb

## 2021-04-06 DIAGNOSIS — Z09 Encounter for follow-up examination after completed treatment for conditions other than malignant neoplasm: Secondary | ICD-10-CM

## 2021-04-06 MED ORDER — FLUCONAZOLE 100 MG PO TABS: 100.0000 mg | ORAL_TABLET | Freq: Every day | ORAL | 1 refills | Status: DC

## 2021-04-06 MED ORDER — OXYCODONE-ACETAMINOPHEN 5-325 MG PO TABS: 1.0000 | ORAL_TABLET | ORAL | 0 refills | Status: DC | PRN

## 2021-04-06 NOTE — Progress Notes (Addendum)
Subjective:     Netty Starring here for postoperative visit, status post Hartman's procedure for metastatic colorectal carcinoma.  The tumor could not be fully removed at the time of surgery.  Patient is aware of this.  Patient has mild incisional pain.  She has learned to take care of her colostomy.  She denies any fever or chills. Objective:    BP 121/78    Pulse (!) 108    Temp 98.4 F (36.9 C) (Other (Comment))    Resp 16    Ht 5\' 5"  (1.651 m)    Wt 155 lb (70.3 kg)    SpO2 98%    BMI 25.79 kg/m   General:  alert, cooperative, and no distress  Abdomen is soft, incision healing well.  Staples removed, Steri-Strips applied.  Colostomy pink and patent.     Assessment:    Doing well postoperatively. Metastatic colorectal carcinoma, with extensive involvement in the pelvis that could not be fully excised.    Plan:   Patient will be seeing Dr. Delton Coombes of oncology tomorrow.  I did explain to the patient that she may need a Port-A-Cath.  Pending oncology consultation, we will schedule this over the phone as needed.  I did reorder her Percocet.  Follow-up here expectantly.  Diflucan prescribed for yeast infection.

## 2021-04-06 NOTE — Addendum Note (Signed)
Addended by: Aviva Signs A on: 04/06/2021 12:15 PM   Modules accepted: Orders

## 2021-04-07 ENCOUNTER — Telehealth: Payer: Self-pay | Admitting: *Deleted

## 2021-04-07 ENCOUNTER — Inpatient Hospital Stay (HOSPITAL_COMMUNITY): Payer: Medicaid Other | Attending: Hematology | Admitting: Hematology

## 2021-04-07 ENCOUNTER — Encounter (HOSPITAL_COMMUNITY): Payer: Self-pay

## 2021-04-07 ENCOUNTER — Encounter (HOSPITAL_COMMUNITY): Payer: Self-pay | Admitting: Hematology

## 2021-04-07 VITALS — BP 137/84 | HR 99 | Temp 98.6°F | Resp 17 | Ht 65.0 in | Wt 144.4 lb

## 2021-04-07 DIAGNOSIS — Z803 Family history of malignant neoplasm of breast: Secondary | ICD-10-CM | POA: Diagnosis not present

## 2021-04-07 DIAGNOSIS — C187 Malignant neoplasm of sigmoid colon: Secondary | ICD-10-CM | POA: Insufficient documentation

## 2021-04-07 DIAGNOSIS — M109 Gout, unspecified: Secondary | ICD-10-CM | POA: Diagnosis not present

## 2021-04-07 DIAGNOSIS — B370 Candidal stomatitis: Secondary | ICD-10-CM | POA: Diagnosis not present

## 2021-04-07 DIAGNOSIS — C189 Malignant neoplasm of colon, unspecified: Secondary | ICD-10-CM | POA: Diagnosis not present

## 2021-04-07 DIAGNOSIS — I10 Essential (primary) hypertension: Secondary | ICD-10-CM | POA: Diagnosis not present

## 2021-04-07 DIAGNOSIS — B3731 Acute candidiasis of vulva and vagina: Secondary | ICD-10-CM | POA: Diagnosis not present

## 2021-04-07 DIAGNOSIS — R634 Abnormal weight loss: Secondary | ICD-10-CM | POA: Insufficient documentation

## 2021-04-07 DIAGNOSIS — M549 Dorsalgia, unspecified: Secondary | ICD-10-CM | POA: Diagnosis not present

## 2021-04-07 DIAGNOSIS — Z79899 Other long term (current) drug therapy: Secondary | ICD-10-CM | POA: Diagnosis not present

## 2021-04-07 DIAGNOSIS — Z793 Long term (current) use of hormonal contraceptives: Secondary | ICD-10-CM | POA: Insufficient documentation

## 2021-04-07 MED ORDER — FLUCONAZOLE 150 MG PO TABS: ORAL_TABLET | ORAL | 0 refills | Status: DC

## 2021-04-07 NOTE — Progress Notes (Signed)
I met with the patient during and following visit with Dr. Delton Coombes. I provided my contact information and encouraged the patient to call with questions and concerns. ?

## 2021-04-07 NOTE — Progress Notes (Signed)
[facility] [address] [place] [id number]   CLINIC:  Medical Oncology/Hematology  CONSULT NOTE  Patient Care Team: Caryl Bis, MD as PCP - General (Family Medicine) Derek Jack, MD as Medical Oncologist (Medical Oncology) Brien Mates, RN as Oncology Nurse Navigator (Medical Oncology)  CHIEF COMPLAINTS/PURPOSE OF CONSULTATION:  Evaluation of colon cancer  HISTORY OF PRESENTING ILLNESS:  [name] 51 y.o. female is here because of evaluation of colon cancer, at the request of Dr. Arnoldo Morale.  Today she reports feeling well. She reports thrush in her mouth currently which has reduced her appetite, but she reports her appetite is improving. She reports losing 15 lbs following her recent surgery. She also has a vaginal yeast infection for which she reports she will be starting diflucan. 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 She also reports soreness at the site of her colostomy for which she is taking 1 tablet of percocet prn; she is not requiring percocet daily. She denies hematochezia and tingling/numbness. She had nausea and vomiting starting last week which has resolved with Zofran. She denies history of CVA and MI. She denies vaginal bleeding.   She lives at home with her family. She currently works as a Quarry manager at [employer]. She denies smoking history. Her paternal cousin and a maternal aunt have cancer, but she cannot recall what kind either had.   MEDICAL HISTORY:  Past Medical History:  Diagnosis Date   Anemia    Back pain    Hypertension     SURGICAL HISTORY: Past Surgical History:  Procedure Laterality Date   BIOPSY  03/02/2021   Procedure: BIOPSY;  Surgeon: Eloise Harman, DO;  [facility];  Service: Endoscopy;;   BOWEL RESECTION N/A 03/26/2021   Procedure: SMALL BOWEL RESECTION;  Surgeon: Aviva Signs, MD;  Location: [facility];  Service: General;   Laterality: N/A;   CHOLECYSTECTOMY     COLOSTOMY N/A 03/26/2021   Procedure: COLOSTOMY;  Surgeon: Aviva Signs, MD;  Location: [facility];  Service: General;  Laterality: N/A;   ESOPHAGOGASTRODUODENOSCOPY (EGD) WITH PROPOFOL N/A 03/02/2021   Procedure: ESOPHAGOGASTRODUODENOSCOPY (EGD) WITH PROPOFOL;  Surgeon: Eloise Harman, DO;  [facility];  Service: Endoscopy;  Laterality: N/A;   PARTIAL COLECTOMY N/A 03/26/2021   Procedure: PARTIAL COLECTOMY;  Surgeon: Aviva Signs, MD;  Location: [facility];  Service: General;  Laterality: N/A;   SCLEROTHERAPY  03/02/2021   Procedure: Clide Deutscher;  Surgeon: Eloise Harman, DO;  [facility];  Service: Endoscopy;;   SIGMOIDOSCOPY  03/02/2021   Procedure: Lonell Face;  Surgeon: Eloise Harman, DO;  [facility];  Service: Endoscopy;;    SOCIAL HISTORY: Social History   Socioeconomic History   Marital status: Married    Spouse name: Not on file   Number of children: Not on file   Years of education: Not on file   Highest education level: Not on file  Occupational History   Not on file  Tobacco Use   Smoking status: Never   Smokeless tobacco: Never  Vaping Use   Vaping Use: Never used  Substance and Sexual Activity   Alcohol use: No   Drug use: No   Sexual activity: Not on file  Other Topics Concern   Not on file  Social History Narrative   Not on file   Social Determinants of Health   Financial Resource Strain:  Not on file  Food Insecurity: Not on file  Transportation Needs: Not on file  Physical Activity: Not on file  Stress: Not on file  Social Connections: Not on file  Intimate Partner Violence: Not on file    FAMILY HISTORY: Family History  Problem Relation Age of Onset   Hypertension Mother    Hypertension Father    Colon cancer Neg Hx    Colon polyps Neg Hx    Inflammatory bowel disease Neg Hx     ALLERGIES:  is allergic to azithromycin, motrin [ibuprofen], lisinopril, and  sudafed [pseudoephedrine hcl].  MEDICATIONS:  Current Outpatient Medications  Medication Sig Dispense Refill   Cholecalciferol 50 MCG (2000 UT) TABS Take 2,000 Units by mouth daily.     ferrous sulfate 325 (65 FE) MG tablet TAKE 325 MG BY MOUTH DAILY. (Patient taking differently: 325 mg daily with breakfast.) 90 tablet 1   fexofenadine (ALLEGRA) 180 MG tablet Take 1 tablet (180 mg total) by mouth daily. 30 tablet 0   fluticasone (FLONASE) 50 MCG/ACT nasal spray Place 2 sprays into both nostrils daily. 16 g 12   lactose free nutrition (BOOST) LIQD Take 237 mLs by mouth daily.     medroxyPROGESTERone (DEPO-PROVERA) 150 MG/ML injection Inject 150 mg into the muscle every 3 (three) months.     medroxyPROGESTERone (PROVERA) 5 MG tablet Take 5 mg by mouth daily.     Multiple Vitamin (MULTIVITAMIN) tablet Take 1 tablet by mouth daily.     nebivolol (BYSTOLIC) 10 MG tablet Take 1 tablet (10 mg total) by mouth daily. 30 tablet 1   oxyCODONE-acetaminophen (PERCOCET) 5-325 MG tablet Take 1 tablet by mouth every 4 (four) hours as needed for severe pain. 30 tablet 0   pantoprazole (PROTONIX) 40 MG tablet Take 40 mg by mouth daily as needed (acid reflux).     ondansetron (ZOFRAN) 4 MG tablet Take 1 tablet (4 mg total) by mouth every 8 (eight) hours as needed for nausea or vomiting. (Patient not taking: Reported on 04/07/2021) 20 tablet 0   No current facility-administered medications for this visit.    REVIEW OF SYSTEMS:   Review of Systems  Constitutional:  Negative for appetite change and fatigue.  Gastrointestinal:  Positive for abdominal pain (colostomy). Negative for blood in stool, nausea (resolved) and vomiting (resolved).  Genitourinary:  Negative for vaginal bleeding.   Musculoskeletal:  Positive for back pain (chronic).  Neurological:  Negative for numbness.  All other systems reviewed and are negative.   PHYSICAL EXAMINATION: ECOG PERFORMANCE STATUS: 1 - Symptomatic but completely  ambulatory  Vitals:   04/07/21 0828  BP: 137/84  Pulse: 99  Resp: 17  Temp: 98.6 F (37 C)  SpO2: 100%   Filed Weights   04/07/21 0828  Weight: 144 lb 6.4 oz (65.5 kg)   Physical Exam Vitals reviewed.  Constitutional:      Appearance: Normal appearance.     Comments: In wheelchair  Cardiovascular:     Rate and Rhythm: Normal rate and regular rhythm.     Pulses: Normal pulses.     Heart sounds: Normal heart sounds.  Pulmonary:     Effort: Pulmonary effort is normal.     Breath sounds: Normal breath sounds.  Abdominal:     General: The ostomy site is clean.     Palpations: Abdomen is soft.     Comments: Thrush present  Musculoskeletal:     Right lower leg: No edema.     Left lower leg: No  edema.  Lymphadenopathy:     Upper Body:     Right upper body: No supraclavicular adenopathy.     Left upper body: No supraclavicular adenopathy.  Neurological:     General: No focal deficit present.     Mental Status: She is alert and oriented to person, place, and time.  Psychiatric:        Mood and Affect: Mood normal.        Behavior: Behavior normal.     LABORATORY DATA:  I have reviewed the data as listed CBC Latest Ref Rng & Units 03/30/2021 03/29/2021 03/28/2021  WBC 4.0 - 10.5 K/uL 8.9 8.7 10.7(H)  Hemoglobin 12.0 - 15.0 g/dL 8.8(L) 8.6(L) 8.3(L)  Hematocrit 36.0 - 46.0 % 29.2(L) 28.2(L) 27.3(L)  Platelets 150 - 400 K/uL 418(H) 361 325   CMP Latest Ref Rng & Units 03/30/2021 03/29/2021 03/28/2021  Glucose 70 - 99 mg/dL 98 105(H) 108(H)  BUN 6 - 20 mg/dL 5(L) <5(L) 8  Creatinine 0.44 - 1.00 mg/dL 0.70 0.62 0.66  Sodium 135 - 145 mmol/L 137 138 139  Potassium 3.5 - 5.1 mmol/L 3.5 3.3(L) 3.7  Chloride 98 - 111 mmol/L 106 105 108  CO2 22 - 32 mmol/L _0 Calcium 8.9 - 10.3 mg/dL 8.4(L) 8.4(L) 8.2(L)  Total Protein 6.5 - 8.1 g/dL - 6.0(L) -  Total Bilirubin 0.3 - 1.2 mg/dL - 0.5 -  Alkaline Phos 38 - 126 U/L - 39 -  AST 15 - 41 U/L - 10(L) -  ALT 0 - 44 U/L - 8 -     RADIOGRAPHIC STUDIES: I have personally reviewed the radiological images as listed and agreed with the findings in the report. No results found.  ASSESSMENT:  PT4PN2B sigmoid colon cancer: - Colonoscopy on 03/02/2021: Nonbleeding internal hemorrhoids, severe stenosis in the sigmoid colon at approximately 18 cm from anal verge, unable to be traversed with pediatric colonoscopy.  Biopsy consistent with moderately to poorly differentiated adenocarcinoma. - CTAP with contrast on 01/10/2021: Diffusely thickened, 10 cm segment of distal sigmoid colon with adjacent moderate inflammatory reaction, suspected 2 sites of small bowel fistulization to the diseased segment and a rim-enhancing low-density 2.5 x 1.8 cm stricture medial to the mid segment which could represent a necrotic lymph node or contained perforation/abscess.  There are bilateral mildly enlarged common iliac chain nodes.  Mildly dilated mid to lower abdominal small bowel segments.  Mildly prominent slightly steatotic liver. - Sigmoid colon resection, partial small bowel resection and Hartman's procedure on 03/26/2021: Moderately to poorly differentiated adenocarcinoma involving the subserosal adipose tissue and present at the margin.  9/9 lymph nodes positive for metastatic carcinoma.  Small bowel resection is positive for adenocarcinoma nodules in the serosal fat.  PT4PN2B.  MMR is preserved. - Operative report mentions obvious extension of the tumor outside the colon to the pelvic brim, unable to fully excise all of the tumor in the pelvis.   Social/family history: - She lives at home with her family.  She works as a Quarry manager at [employer].  Non-smoker. - Paternal cousin had cancer.  Maternal aunt had cancer.   PLAN:  PT4PN2B sigmoid colon adenocarcinoma: - Tumor was unable to be fully excised per operative report. - Recommend PET CT scan for staging of her cancer. - Recommend NGS testing for extended RAS, BRAF and MSI  testing. - Recommend port placement for chemotherapy administration. - RTC after the PET scan.  2.  Back pain/abdominal pain: - She has chronic  back pain.  She has mild soreness at the surgical site. - She is taking Percocet 5/325 1 tablet every 1 to 2 days.  3.  Oral thrush: - She has lost about 15 to 20 pounds since surgery. - She has oral thrush.  She was told to start using Diflucan sent by Dr. Arnoldo Morale.   All questions were answered. The patient knows to call the clinic with any problems, questions or concerns.   Derek Jack, MD, 04/07/21 6:41 PM  [place] [phone]   I, Thana Ates, am acting as a scribe for Dr. Derek Jack.  I, Derek Jack MD, have reviewed the above documentation for accuracy and completeness, and I agree with the above.

## 2021-04-07 NOTE — Patient Instructions (Addendum)
[place] at [facility] ?Discharge Instructions ? ?You were seen and examined today by Dr. Delton Coombes. Dr. Delton Coombes is a medical oncologist, meaning that he specializes in the treatment of cancer diagnoses. Dr. Delton Coombes discussed your past medical history, family history of cancers, and the events that led to you being here today. ? ?You were referred to Dr. Delton Coombes by Dr. Arnoldo Morale due to your recent diagnosis of colon cancer. Dr. Delton Coombes reviewed the pathology results from your surgery, which revealed that the cancer has spread beyond the colon and to your lymph nodes. Each lymph node that was removed was positive for colon cancer. This makes it at least a Stage III cancer, because of the lymph node involvement Dr. Delton Coombes has recommended a PET scan. A PET scan is a specialized CT scan that illuminates where there is cancer present in the body and will accurately identify the stage of the cancer. ? ?Dr. Delton Coombes has recommended treatment despite the PET scan results. As stated above, the cancer is at least a Stage III, you will need treatment to treat any microscopic remaining cancer as well as prevent future recurrence. To safely administer chemotherapy, you will need a Port-A-Cath. Dr. Arnoldo Morale can place this in a same-day procedure. ? ?Follow-up after the PET scan. ? ? ?Thank you for choosing [place] at [facility] to provide your oncology and hematology care.  To afford each patient quality time with our provider, please arrive at least 15 minutes before your scheduled appointment time.  ? ?If you have a lab appointment with the [facility] please come in thru the Main Entrance and check in at the main information desk. ? ?You need to re-schedule your appointment should you arrive 10 or more minutes late.  We strive to give you quality time with our providers, and arriving late affects you and other patients whose appointments are after yours.   Also, if you no show three or more times for appointments you may be dismissed from the clinic at the providers discretion.     ?Again, thank you for choosing [facility].  Our hope is that these requests will decrease the amount of time that you wait before being seen by our physicians.       ?_____________________________________________________________ ? ?Should you have questions after your visit to [facility], please contact our office at [phone] and follow the prompts.  Our office hours are 8:00 a.m. and 4:30 p.m. Monday - Friday.  Please note that voicemails left after 4:00 p.m. may not be returned until the following business day.  We are closed weekends and major holidays.  You do have access to a nurse 24-7, just call the main number to the clinic [phone] and do not press any options, hold on the line and a nurse will answer the phone.   ? ?For prescription refill requests, have your pharmacy contact our office and allow 72 hours.   ? ?Due to Covid, you will need to wear a mask upon entering the hospital. If you do not have a mask, a mask will be given to you at the Main Entrance upon arrival. For doctor visits, patients may have 1 support person age 19 or older with them. For treatment visits, patients can not have anyone with them due to social distancing guidelines and our immunocompromised population.  ? ? ? ?

## 2021-04-07 NOTE — Progress Notes (Signed)
Breese testing requested on [phone] per Dr. Delton Coombes ?

## 2021-04-07 NOTE — Telephone Encounter (Signed)
Received call from patient.  ? ?Reports that she was unable to pick up prescription for Diflucan 100mg .  ? ?Dr. Arnoldo Morale made aware and new orders given for Diflucan 150mg  (1) tab now and repeat in 72 hours  ? ?Call placed to pharmacy to confirm that 150mg  is in stock. Was advised by pharmacist that Diflucan 100mg  was prescribed by Christain Sacramento and patient picked up prescription on 04/05/2021. ? ?Call placed to patient to advise of findings. Patient made aware and verbalized understanding.  ? ?

## 2021-04-08 NOTE — H&P (Signed)
Diana Fox; [id number]; 22-Jan-1971 ? ? ?HPI ?Patient is a 51 year old black female who was referred to my care by Dr. Abbey Chatters of gastroenterology and Dr. 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  This was found on colonoscopy for work-up of an abnormal CT scan of the abdomen.  At approximately 18 cm from the anus, a stricture was found and could not be traversed even with the pediatric colonoscope.  Biopsies of this area were taken and adenocarcinoma of the colon was found.  Patient has had multiple issues including intermittent partial small bowel obstruction with inflammation of the sigmoid colon.  Patient subsequently underwent a Hartman's procedure and partial small bowel resection on 03/26/2021.  She was diagnosed with stage III colorectal carcinoma.  It could not be fully removed at the time of surgery.  She has been seen by Dr. Delton Coombes and will be starting chemotherapy.  She needs central venous access. ?Past Medical History:  ?Diagnosis Date  ? Anemia   ? Back pain   ? Hypertension   ? ? ?Past Surgical History:  ?Procedure Laterality Date  ? BIOPSY  03/02/2021  ? Procedure: BIOPSY;  Surgeon: Eloise Harman, DO;  [facility];  Service: Endoscopy;;  ? CHOLECYSTECTOMY    ? ESOPHAGOGASTRODUODENOSCOPY (EGD) WITH PROPOFOL N/A 03/02/2021  ? Procedure: ESOPHAGOGASTRODUODENOSCOPY (EGD) WITH PROPOFOL;  Surgeon: Eloise Harman, DO;  [facility];  Service: Endoscopy;  Laterality: N/A;  ? SCLEROTHERAPY  03/02/2021  ? Procedure: [id number];  Surgeon: Eloise Harman, DO;  [facility];  Service: Endoscopy;;  ? SIGMOIDOSCOPY  03/02/2021  ? Procedure: SIGMOIDOSCOPY;  Surgeon: Eloise Harman, DO;  [facility];  Service: Endoscopy;;  ? ? ?Family History  ?Problem Relation Age of Onset  ? Hypertension Mother   ? Hypertension Father   ? Colon cancer Neg Hx   ? Colon polyps Neg Hx   ? Inflammatory bowel disease Neg Hx   ? ? ?Current  Outpatient Medications on File Prior to Visit  ?Medication Sig Dispense Refill  ? Cholecalciferol 50 MCG (2000 UT) TABS Take by mouth daily.    ? ferrous sulfate 325 (65 FE) MG tablet TAKE 325 MG BY MOUTH DAILY. 90 tablet 1  ? fexofenadine (ALLEGRA) 180 MG tablet Take 1 tablet (180 mg total) by mouth daily. 30 tablet 0  ? fluticasone (FLONASE) 50 MCG/ACT nasal spray Place 2 sprays into both nostrils daily. 16 g 12  ? medroxyPROGESTERone (PROVERA) 5 MG tablet Take 5 mg by mouth daily.    ? Multiple Vitamin (MULTIVITAMIN) tablet Take 1 tablet by mouth daily.    ? nebivolol (BYSTOLIC) 10 MG tablet Take 1 tablet (10 mg total) by mouth daily. 30 tablet 1  ? pantoprazole (PROTONIX) 40 MG tablet Take 40 mg by mouth daily.    ? feeding supplement (ENSURE ENLIVE / ENSURE PLUS) LIQD Take 237 mLs by mouth 2 (two) times daily between meals. (Patient not taking: Reported on 02/23/2021) 237 mL 60  ? ?No current facility-administered medications on file prior to visit.  ? ? ?Allergies  ?Allergen Reactions  ? Azithromycin Hives  ?  infusing arm began to swell , her face got red, and she broke out, there are bumps around where the IV site was infusing  ? Motrin [Ibuprofen] Itching  ? Lisinopril   ?  cough  ? Sudafed [Pseudoephedrine Hcl] Other (See Comments)  ?  Hypertension  ? ? ?Social History  ? ?Substance  and Sexual Activity  ?Alcohol Use No  ? ? ?Social History  ? ?Tobacco Use  ?Smoking Status Never  ?Smokeless Tobacco Never  ? ? ?Review of Systems  ?Constitutional: Negative.   ?HENT:  Positive for sinus pain.   ?Eyes: Negative.   ?Respiratory: Negative.    ?Cardiovascular: Negative.   ?Gastrointestinal:  Positive for abdominal pain and heartburn.  ?Genitourinary: Negative.   ?Musculoskeletal:  Positive for back pain.  ?Skin: Negative.   ?Neurological: Negative.   ?Endo/Heme/Allergies: Negative.   ?Psychiatric/Behavioral: Negative.    ? ?Objective  ? ?Vitals:  ? 03/11/21 0910  ?BP: 134/84  ?Pulse: 76  ?Resp: 14  ?Temp: 97.8 ?F  (36.6 ?C)  ? ? ?Physical Exam ?Vitals reviewed.  ?Constitutional:   ?   Appearance: Normal appearance. She is normal weight. She is not ill-appearing.  ?HENT:  ?   Head: Normocephalic and atraumatic.  ?Cardiovascular:  ?   Rate and Rhythm: Normal rate and regular rhythm.  ?   Heart sounds: Normal heart sounds. No murmur heard. ?  No friction rub. No gallop.  ?Pulmonary:  ?   Effort: Pulmonary effort is normal. No respiratory distress.  ?   Breath sounds: Normal breath sounds. No stridor. No wheezing, rhonchi or rales.  ?Abdominal:  ?   General: Bowel sounds are normal. There is no distension.  ?   Palpations: Abdomen is soft. There is mass.  ?   Tenderness: There is no abdominal tenderness. There is no guarding or rebound.  ?   Hernia: No hernia is present.  ?   Comments: A well-healed midline surgical scar is noted.  Colostomy is present along the left side of the abdomen  ?Skin: ?   General: Skin is warm and dry.  ?Neurological:  ?   Mental Status: She is alert and oriented to person, place, and time.  ? ?Assessment  ?Sigmoid colon carcinoma, status post Hartman's procedure, partial small bowel resection  ?Plan  ?Patient is scheduled for Port-A-Cath insertion on 04/14/2021.  The risks and benefits of the procedure including bleeding, infection, pneumothorax were fully explained to the patient, who gave informed consent. ?

## 2021-04-08 NOTE — Patient Instructions (Signed)
Diana Fox  04/08/2021     @PREFPERIOPPHARMACY @   Your procedure is scheduled on  04/14/2021.   Report to [facility] at  0830 A.M.   Call this number if you have problems the morning of surgery:  [phone]   Remember:  Do not eat or drink after midnight.      Take these medicines the morning of surgery with A SIP OF WATER                      Allegra, bystolic, oxycodone(if needed).     Do not wear jewelry, make-up or nail polish.  Do not wear lotions, powders, or perfumes, or deodorant.  Do not shave 48 hours prior to surgery.  Men may shave face and neck.  Do not bring valuables to the hospital.  [facility] is not responsible for any belongings or valuables.  Contacts, dentures or bridgework may not be worn into surgery.  Leave your suitcase in the car.  After surgery it may be brought to your room.  For patients admitted to the hospital, discharge time will be determined by your treatment team.  Patients discharged the day of surgery will not be allowed to drive home and must have someone with them for 24 hours.    Special instructions:   DO NOT smoke tobacco or vape for 24 hours before your procedure.  Please read over the following fact sheets that you were given. Anesthesia Post-op Instructions and Care and Recovery After Surgery      Implanted [facility] Guide An implanted port is a device that is placed under the skin. It is usually placed in the chest. The device may vary based on the need. Implanted ports can be used to give IV medicine, to take blood, or to give fluids. You may have an implanted port if: You need IV medicine that would be irritating to the small veins in your hands or arms. You need IV medicines, such as chemotherapy, for a long period of time. You need IV nutrition for a long period of time. You may have fewer limitations when using a port than you would if you used other types of long-term IVs. You will also likely be  able to return to normal activities after your incision heals. An implanted port has two main parts: Reservoir. The reservoir is the part where a needle is inserted to give medicines or draw blood. The reservoir is round. After the port is placed, it appears as a small, raised area under your skin. Catheter. The catheter is a small, thin tube that connects the reservoir to a vein. Medicine that is inserted into the reservoir goes into the catheter and then into the vein. How is my port accessed? To access your port: A numbing cream may be placed on the skin over the port site. Your health care provider will put on a mask and sterile gloves. The skin over your port will be cleaned carefully with a germ-killing soap and allowed to dry. Your health care provider will gently pinch the port and insert a needle into it. Your health care provider will check for a blood return to make sure the port is in the vein and is still working (patent). If your port needs to remain accessed to get medicine continuously (constant infusion), your health care provider will place a clear bandage (dressing) over the needle site. The dressing and needle  will need to be changed every week, or as told by your health care provider. What is flushing? Flushing helps keep the port working. Follow instructions from your health care provider about how and when to flush the port. Ports are usually flushed with saline solution or a medicine called heparin. The need for flushing will depend on how the port is used: If the port is only used from time to time to give medicines or draw blood, the port may need to be flushed: Before and after medicines have been given. Before and after blood has been drawn. As part of routine maintenance. Flushing may be recommended every 4-6 weeks. If a constant infusion is running, the port may not need to be flushed. Throw away any syringes in a disposal container that is meant for sharp items  (sharps container). You can buy a sharps container from a pharmacy, or you can make one by using an empty hard plastic bottle with a cover. How long will my port stay implanted? The port can stay in for as long as your health care provider thinks it is needed. When it is time for the port to come out, a surgery will be done to remove it. The surgery will be similar to the procedure that was done to put the port in. Follow these instructions at home: Caring for your port and port site Flush your port as told by your health care provider. If you need an infusion over several days, follow instructions from your health care provider about how to take care of your port site. Make sure you: Change your dressing as told by your health care provider. Wash your hands with soap and water for at least 20 seconds before and after you change your dressing. If soap and water are not available, use alcohol-based hand sanitizer. Place any used dressings or infusion bags into a plastic bag. Throw that bag in the trash. Keep the dressing that covers the needle clean and dry. Do not get it wet. Do not use scissors or sharp objects near the infusion tubing. Keep any external tubes clamped, unless they are being used. Check your port site every day for signs of infection. Check for: Redness, swelling, or pain. Fluid or blood. Warmth. Pus or a bad smell. Protect the skin around the port site. Avoid wearing bra straps that rub or irritate the site. Protect the skin around your port from seat belts. Place a soft pad over your chest if needed. Bathe or shower as told by your health care provider. The site may get wet as long as you are not actively receiving an infusion. General instructions  Return to your normal activities as told by your health care provider. Ask your health care provider what activities are safe for you. Carry a medical alert card or wear a medical alert bracelet at all times. This will let  health care providers know that you have an implanted port in case of an emergency. Where to find more information American [employer] Society: www.cancer.[place] of Clinical Oncology: www.cancer.net Contact a health care provider if: You have a fever or chills. You have redness, swelling, or pain at the port site. You have fluid or blood coming from your port site. Your incision feels warm to the touch. You have pus or a bad smell coming from the port site. Summary Implanted ports are usually placed in the chest for long-term IV access. Follow instructions from your health care provider about flushing the  port and changing bandages (dressings). Take care of the area around your port by avoiding clothing that puts pressure on the area, and by watching for signs of infection. Protect the skin around your port from seat belts. Place a soft pad over your chest if needed. Contact a health care provider if you have a fever or you have redness, swelling, pain, fluid, or a bad smell at the port site. This information is not intended to replace advice given to you by your health care provider. Make sure you discuss any questions you have with your health care provider. Document Revised: 07/28/2020 Document Reviewed: 07/28/2020 Elsevier Patient Education  2022 [place] Insertion, Care After The following information offers guidance on how to care for yourself after your procedure. Your health care provider may also give you more specific instructions. If you have problems or questions, contact your health care provider. What can I expect after the procedure? After the procedure, it is common to have: Discomfort at the port insertion site. Bruising on the skin over the port. This should improve over 3-4 days. Follow these instructions at home: [facility] care After your port is placed, you will get a manufacturer's information card. The card has information about your port.  Keep this card with you at all times. Take care of the port as told by your health care provider. Ask your health care provider if you or a family member can get training for taking care of the port at home. A home health care nurse will be be available to help care for the port. Make sure to remember what type of port you have. Incision care   Follow instructions from your health care provider about how to take care of your port insertion site. Make sure you: Wash your hands with soap and water for at least 20 seconds before and after you change your bandage (dressing). If soap and water are not available, use hand sanitizer. Change your dressing as told by your health care provider. Leave stitches (sutures), skin glue, or adhesive strips in place. These skin closures may need to stay in place for 2 weeks or longer. If adhesive strip edges start to loosen and curl up, you may trim the loose edges. Do not remove adhesive strips completely unless your health care provider tells you to do that. Check your port insertion site every day for signs of infection. Check for: Redness, swelling, or pain. Fluid or blood. Warmth. Pus or a bad smell. Activity Return to your normal activities as told by your health care provider. Ask your health care provider what activities are safe for you. You may have to avoid lifting. Ask your health care provider how much you can safely lift. General instructions Take over-the-counter and prescription medicines only as told by your health care provider. Do not take baths, swim, or use a hot tub until your health care provider approves. Ask your health care provider if you may take showers. You may only be allowed to take sponge baths. If you were given a sedative during the procedure, it can affect you for several hours. Do not drive or operate machinery until your health care provider says that it is safe. Wear a medical alert bracelet in case of an emergency. This  will tell any health care providers that you have a port. Keep all follow-up visits. This is important. Contact a health care provider if: You cannot flush your port with saline as directed, or you cannot  draw blood from the port. You have a fever or chills. You have redness, swelling, or pain around your port insertion site. You have fluid or blood coming from your port insertion site. Your port insertion site feels warm to the touch. You have pus or a bad smell coming from the port insertion site. Get help right away if: You have chest pain or shortness of breath. You have bleeding from your port that you cannot control. These symptoms may be an emergency. Get help right away. Call 911. Do not wait to see if the symptoms will go away. Do not drive yourself to the hospital. Summary Take care of the port as told by your health care provider. Keep the manufacturer's information card with you at all times. Change your dressing as told by your health care provider. Contact a health care provider if you have a fever or chills or if you have redness, swelling, or pain around your port insertion site. Keep all follow-up visits. This information is not intended to replace advice given to you by your health care provider. Make sure you discuss any questions you have with your health care provider. Document Revised: 07/28/2020 Document Reviewed: 07/28/2020 Elsevier Patient Education  [place] Anesthesia, Adult, Care After This sheet gives you information about how to care for yourself after your procedure. Your health care provider may also give you more specific instructions. If you have problems or questions, contact your health care provider. What can I expect after the procedure? After the procedure, the following side effects are common: Pain or discomfort at the IV site. Nausea. Vomiting. Sore throat. Trouble concentrating. Feeling cold or chills. Feeling weak or  tired. Sleepiness and fatigue. Soreness and body aches. These side effects can affect parts of the body that were not involved in surgery. Follow these instructions at home: For the time period you were told by your health care provider:  Rest. Do not participate in activities where you could fall or become injured. Do not drive or use machinery. Do not drink alcohol. Do not take sleeping pills or medicines that cause drowsiness. Do not make important decisions or sign legal documents. Do not take care of children on your own. Eating and drinking Follow any instructions from your health care provider about eating or drinking restrictions. When you feel hungry, start by eating small amounts of foods that are soft and easy to digest (bland), such as toast. Gradually return to your regular diet. Drink enough fluid to keep your urine pale yellow. If you vomit, rehydrate by drinking water, juice, or clear broth. General instructions If you have sleep apnea, surgery and certain medicines can increase your risk for breathing problems. Follow instructions from your health care provider about wearing your sleep device: Anytime you are sleeping, including during daytime naps. While taking prescription pain medicines, sleeping medicines, or medicines that make you drowsy. Have a responsible adult stay with you for the time you are told. It is important to have someone help care for you until you are awake and alert. Return to your normal activities as told by your health care provider. Ask your health care provider what activities are safe for you. Take over-the-counter and prescription medicines only as told by your health care provider. If you smoke, do not smoke without supervision. Keep all follow-up visits as told by your health care provider. This is important. Contact a health care provider if: You have nausea or vomiting that does not get better  with medicine. You cannot eat or drink  without vomiting. You have pain that does not get better with medicine. You are unable to pass urine. You develop a skin rash. You have a fever. You have redness around your IV site that gets worse. Get help right away if: You have difficulty breathing. You have chest pain. You have blood in your urine or stool, or you vomit blood. Summary After the procedure, it is common to have a sore throat or nausea. It is also common to feel tired. Have a responsible adult stay with you for the time you are told. It is important to have someone help care for you until you are awake and alert. When you feel hungry, start by eating small amounts of foods that are soft and easy to digest (bland), such as toast. Gradually return to your regular diet. Drink enough fluid to keep your urine pale yellow. Return to your normal activities as told by your health care provider. Ask your health care provider what activities are safe for you. This information is not intended to replace advice given to you by your health care provider. Make sure you discuss any questions you have with your health care provider. Document Revised: 10/10/2019 Document Reviewed: 05/09/2019 Elsevier Patient Education  2022 [place]. How to Use Chlorhexidine for Bathing Chlorhexidine gluconate (CHG) is a germ-killing (antiseptic) solution that is used to clean the skin. It can get rid of the bacteria that normally live on the skin and can keep them away for about 24 hours. To clean your skin with CHG, you may be given: A CHG solution to use in the shower or as part of a sponge bath. A prepackaged cloth that contains CHG. Cleaning your skin with CHG may help lower the risk for infection: While you are staying in the intensive care unit of the hospital. If you have a vascular access, such as a central line, to provide short-term or long-term access to your veins. If you have a catheter to drain urine from your bladder. If you are on a  ventilator. A ventilator is a machine that helps you breathe by moving air in and out of your lungs. After surgery. What are the risks? Risks of using CHG include: A skin reaction. Hearing loss, if CHG gets in your ears and you have a perforated eardrum. Eye injury, if CHG gets in your eyes and is not rinsed out. The CHG product catching fire. Make sure that you avoid smoking and flames after applying CHG to your skin. Do not use CHG: If you have a chlorhexidine allergy or have previously reacted to chlorhexidine. On babies younger than 25 months of age. How to use CHG solution Use CHG only as told by your health care provider, and follow the instructions on the label. Use the full amount of CHG as directed. Usually, this is one bottle. During a shower Follow these steps when using CHG solution during a shower (unless your health care provider gives you different instructions): Start the shower. Use your normal soap and shampoo to wash your face and hair. Turn off the shower or move out of the shower stream. Pour the CHG onto a clean washcloth. Do not use any type of brush or rough-edged sponge. Starting at your neck, lather your body down to your toes. Make sure you follow these instructions: If you will be having surgery, pay special attention to the part of your body where you will be having surgery. Scrub this  area for at least 1 minute. Do not use CHG on your head or face. If the solution gets into your ears or eyes, rinse them well with water. Avoid your genital area. Avoid any areas of skin that have broken skin, cuts, or scrapes. Scrub your back and under your arms. Make sure to wash skin folds. Let the lather sit on your skin for 1-2 minutes or as long as told by your health care provider. Thoroughly rinse your entire body in the shower. Make sure that all body creases and crevices are rinsed well. Dry off with a clean towel. Do not put any substances on your body afterward--such  as powder, lotion, or perfume--unless you are told to do so by your health care provider. Only use lotions that are recommended by the manufacturer. Put on clean clothes or pajamas. If it is the night before your surgery, sleep in clean sheets.  During a sponge bath Follow these steps when using CHG solution during a sponge bath (unless your health care provider gives you different instructions): Use your normal soap and shampoo to wash your face and hair. Pour the CHG onto a clean washcloth. Starting at your neck, lather your body down to your toes. Make sure you follow these instructions: If you will be having surgery, pay special attention to the part of your body where you will be having surgery. Scrub this area for at least 1 minute. Do not use CHG on your head or face. If the solution gets into your ears or eyes, rinse them well with water. Avoid your genital area. Avoid any areas of skin that have broken skin, cuts, or scrapes. Scrub your back and under your arms. Make sure to wash skin folds. Let the lather sit on your skin for 1-2 minutes or as long as told by your health care provider. Using a different clean, wet washcloth, thoroughly rinse your entire body. Make sure that all body creases and crevices are rinsed well. Dry off with a clean towel. Do not put any substances on your body afterward--such as powder, lotion, or perfume--unless you are told to do so by your health care provider. Only use lotions that are recommended by the manufacturer. Put on clean clothes or pajamas. If it is the night before your surgery, sleep in clean sheets. How to use CHG prepackaged cloths Only use CHG cloths as told by your health care provider, and follow the instructions on the label. Use the CHG cloth on clean, dry skin. Do not use the CHG cloth on your head or face unless your health care provider tells you to. When washing with the CHG cloth: Avoid your genital area. Avoid any areas of skin  that have broken skin, cuts, or scrapes. Before surgery Follow these steps when using a CHG cloth to clean before surgery (unless your health care provider gives you different instructions): Using the CHG cloth, vigorously scrub the part of your body where you will be having surgery. Scrub using a back-and-forth motion for 3 minutes. The area on your body should be completely wet with CHG when you are done scrubbing. Do not rinse. Discard the cloth and let the area air-dry. Do not put any substances on the area afterward, such as powder, lotion, or perfume. Put on clean clothes or pajamas. If it is the night before your surgery, sleep in clean sheets.  For general bathing Follow these steps when using CHG cloths for general bathing (unless your health care provider gives  you different instructions). Use a separate CHG cloth for each area of your body. Make sure you wash between any folds of skin and between your fingers and toes. Wash your body in the following order, switching to a new cloth after each step: The front of your neck, shoulders, and chest. Both of your arms, under your arms, and your hands. Your stomach and groin area, avoiding the genitals. Your right leg and foot. Your left leg and foot. The back of your neck, your back, and your buttocks. Do not rinse. Discard the cloth and let the area air-dry. Do not put any substances on your body afterward--such as powder, lotion, or perfume--unless you are told to do so by your health care provider. Only use lotions that are recommended by the manufacturer. Put on clean clothes or pajamas. Contact a health care provider if: Your skin gets irritated after scrubbing. You have questions about using your solution or cloth. You swallow any chlorhexidine. Call your local poison control center (1-972-576-9247 in the [place] Get help right away if: Your eyes itch badly, or they become very red or swollen. Your skin itches badly and is red or  swollen. Your hearing changes. You have trouble seeing. You have swelling or tingling in your mouth or throat. You have trouble breathing. These symptoms may represent a serious problem that is an emergency. Do not wait to see if the symptoms will go away. Get medical help right away. Call your local emergency services (911 in the [place] Do not drive yourself to the hospital. Summary Chlorhexidine gluconate (CHG) is a germ-killing (antiseptic) solution that is used to clean the skin. Cleaning your skin with CHG may help to lower your risk for infection. You may be given CHG to use for bathing. It may be in a bottle or in a prepackaged cloth to use on your skin. Carefully follow your health care provider's instructions and the instructions on the product label. Do not use CHG if you have a chlorhexidine allergy. Contact your health care provider if your skin gets irritated after scrubbing. This information is not intended to replace advice given to you by your health care provider. Make sure you discuss any questions you have with your health care provider. Document Revised: 04/06/2020 Document Reviewed: 04/06/2020 Elsevier Patient Education  2022 [employer].

## 2021-04-12 ENCOUNTER — Ambulatory Visit (HOSPITAL_COMMUNITY): Payer: Medicaid Other

## 2021-04-13 ENCOUNTER — Other Ambulatory Visit: Payer: Self-pay

## 2021-04-13 ENCOUNTER — Encounter (HOSPITAL_COMMUNITY)
Admission: RE | Admit: 2021-04-13 | Discharge: 2021-04-13 | Disposition: A | Discharge status: Home / Self Care | Payer: Medicaid Other | Source: Ambulatory Visit | Attending: General Surgery | Admitting: General Surgery

## 2021-04-13 ENCOUNTER — Telehealth: Payer: Self-pay | Admitting: *Deleted

## 2021-04-13 ENCOUNTER — Encounter (HOSPITAL_COMMUNITY): Payer: Self-pay

## 2021-04-13 VITALS — Ht 65.0 in | Wt 144.4 lb

## 2021-04-13 DIAGNOSIS — Z01818 Encounter for other preprocedural examination: Secondary | ICD-10-CM

## 2021-04-13 NOTE — Telephone Encounter (Signed)
Received call from patient.  ? ?Reports that she feels she is having side effects from pain medication.  ? ?States that she is having increased hot flashes, sweating, and episodes of emotional distress.  ? ?Advised that Dr. Arnoldo Morale does not think Oxycodone is causing Sx. Advised that she can stop taking medication at this time to see if Sx improve. Advised to alternate IBU and APAP. ? ?Verbalized understanding.  ?

## 2021-04-14 ENCOUNTER — Other Ambulatory Visit: Payer: Self-pay

## 2021-04-14 ENCOUNTER — Ambulatory Visit (HOSPITAL_COMMUNITY): Payer: Medicaid Other

## 2021-04-14 ENCOUNTER — Encounter (HOSPITAL_COMMUNITY)
Admission: RE | Disposition: A | Discharge status: Home / Self Care | Payer: Self-pay | Source: Home / Self Care | Attending: General Surgery

## 2021-04-14 ENCOUNTER — Ambulatory Visit (HOSPITAL_BASED_OUTPATIENT_CLINIC_OR_DEPARTMENT_OTHER): Payer: Medicaid Other | Admitting: Certified Registered"

## 2021-04-14 ENCOUNTER — Ambulatory Visit (HOSPITAL_COMMUNITY)
Admission: RE | Admit: 2021-04-14 | Discharge: 2021-04-14 | Disposition: A | Discharge status: Home / Self Care | Payer: Medicaid Other | Attending: General Surgery | Admitting: General Surgery

## 2021-04-14 ENCOUNTER — Encounter (HOSPITAL_COMMUNITY): Payer: Self-pay | Admitting: General Surgery

## 2021-04-14 ENCOUNTER — Ambulatory Visit (HOSPITAL_COMMUNITY): Payer: Medicaid Other | Admitting: Certified Registered"

## 2021-04-14 DIAGNOSIS — Z79899 Other long term (current) drug therapy: Secondary | ICD-10-CM | POA: Diagnosis not present

## 2021-04-14 DIAGNOSIS — Z793 Long term (current) use of hormonal contraceptives: Secondary | ICD-10-CM | POA: Insufficient documentation

## 2021-04-14 DIAGNOSIS — C785 Secondary malignant neoplasm of large intestine and rectum: Secondary | ICD-10-CM | POA: Diagnosis not present

## 2021-04-14 DIAGNOSIS — C189 Malignant neoplasm of colon, unspecified: Secondary | ICD-10-CM

## 2021-04-14 DIAGNOSIS — I10 Essential (primary) hypertension: Secondary | ICD-10-CM | POA: Diagnosis not present

## 2021-04-14 DIAGNOSIS — Z452 Encounter for adjustment and management of vascular access device: Secondary | ICD-10-CM | POA: Insufficient documentation

## 2021-04-14 DIAGNOSIS — C19 Malignant neoplasm of rectosigmoid junction: Secondary | ICD-10-CM | POA: Insufficient documentation

## 2021-04-14 DIAGNOSIS — Z01818 Encounter for other preprocedural examination: Secondary | ICD-10-CM

## 2021-04-14 DIAGNOSIS — Z95828 Presence of other vascular implants and grafts: Secondary | ICD-10-CM

## 2021-04-14 DIAGNOSIS — C7989 Secondary malignant neoplasm of other specified sites: Secondary | ICD-10-CM

## 2021-04-14 HISTORY — PX: PORTACATH PLACEMENT: SHX2246

## 2021-04-14 LAB — PREGNANCY, URINE: Preg Test, Ur: NEGATIVE

## 2021-04-14 SURGERY — INSERTION, TUNNELED CENTRAL VENOUS DEVICE, WITH PORT
Anesthesia: General | Site: Chest | Laterality: Left

## 2021-04-14 MED ORDER — ONDANSETRON HCL 4 MG/2ML IJ SOLN
INTRAMUSCULAR | Status: DC | PRN
  Administered 2021-04-14: 4 mg via INTRAVENOUS

## 2021-04-14 MED ORDER — PROPOFOL 10 MG/ML IV BOLUS
INTRAVENOUS | Status: AC
  Filled 2021-04-14: qty 20

## 2021-04-14 MED ORDER — SODIUM CHLORIDE (PF) 0.9 % IJ SOLN
INTRAMUSCULAR | Status: AC
  Filled 2021-04-14: qty 10

## 2021-04-14 MED ORDER — HYDROCODONE-ACETAMINOPHEN 7.5-325 MG PO TABS: 1.0000 | ORAL_TABLET | Freq: Four times a day (QID) | ORAL | 0 refills | Status: DC | PRN

## 2021-04-14 MED ORDER — LIDOCAINE HCL (PF) 1 % IJ SOLN
INTRAMUSCULAR | Status: DC | PRN
  Administered 2021-04-14: 5 mL

## 2021-04-14 MED ORDER — ORAL CARE MOUTH RINSE: 15.0000 mL | Freq: Once | OROMUCOSAL | Status: AC

## 2021-04-14 MED ORDER — FENTANYL CITRATE (PF) 100 MCG/2ML IJ SOLN
INTRAMUSCULAR | Status: AC
  Filled 2021-04-14: qty 2

## 2021-04-14 MED ORDER — CHLORHEXIDINE GLUCONATE CLOTH 2 % EX PADS: 6.0000 | MEDICATED_PAD | Freq: Once | CUTANEOUS | Status: DC

## 2021-04-14 MED ORDER — FENTANYL CITRATE (PF) 100 MCG/2ML IJ SOLN
INTRAMUSCULAR | Status: DC | PRN
  Administered 2021-04-14 (×2): 50 ug via INTRAVENOUS

## 2021-04-14 MED ORDER — CHLORHEXIDINE GLUCONATE 0.12 % MT SOLN
15.0000 mL | Freq: Once | OROMUCOSAL | Status: AC
  Administered 2021-04-14: 15 mL via OROMUCOSAL

## 2021-04-14 MED ORDER — CEFAZOLIN SODIUM-DEXTROSE 2-4 GM/100ML-% IV SOLN
2.0000 g | INTRAVENOUS | Status: AC
  Administered 2021-04-14 (×2): 2 g via INTRAVENOUS
  Filled 2021-04-14: qty 100

## 2021-04-14 MED ORDER — FENTANYL CITRATE PF 50 MCG/ML IJ SOSY: 25.0000 ug | PREFILLED_SYRINGE | INTRAMUSCULAR | Status: DC | PRN

## 2021-04-14 MED ORDER — HEPARIN SOD (PORK) LOCK FLUSH 100 UNIT/ML IV SOLN
INTRAVENOUS | Status: DC | PRN
  Administered 2021-04-14: 500 [IU] via INTRAVENOUS

## 2021-04-14 MED ORDER — ONDANSETRON HCL 4 MG/2ML IJ SOLN
INTRAMUSCULAR | Status: AC
  Filled 2021-04-14: qty 2

## 2021-04-14 MED ORDER — SODIUM CHLORIDE (PF) 0.9 % IJ SOLN
INTRAMUSCULAR | Status: DC | PRN
  Administered 2021-04-14: 5 mL via INTRAVENOUS

## 2021-04-14 MED ORDER — MIDAZOLAM HCL 5 MG/5ML IJ SOLN
INTRAMUSCULAR | Status: DC | PRN
  Administered 2021-04-14: 2 mg via INTRAVENOUS

## 2021-04-14 MED ORDER — LACTATED RINGERS IV SOLN: INTRAVENOUS | Status: DC

## 2021-04-14 MED ORDER — ONDANSETRON HCL 4 MG/2ML IJ SOLN: 4.0000 mg | Freq: Once | INTRAMUSCULAR | Status: DC | PRN

## 2021-04-14 MED ORDER — MIDAZOLAM HCL 2 MG/2ML IJ SOLN
INTRAMUSCULAR | Status: AC
  Filled 2021-04-14: qty 2

## 2021-04-14 MED ORDER — HEPARIN SOD (PORK) LOCK FLUSH 100 UNIT/ML IV SOLN
INTRAVENOUS | Status: AC
  Filled 2021-04-14: qty 5

## 2021-04-14 MED ORDER — KETOROLAC TROMETHAMINE 30 MG/ML IJ SOLN
INTRAMUSCULAR | Status: AC
  Filled 2021-04-14: qty 1

## 2021-04-14 MED ORDER — PROPOFOL 500 MG/50ML IV EMUL
INTRAVENOUS | Status: DC | PRN
  Administered 2021-04-14: 120 ug/kg/min via INTRAVENOUS

## 2021-04-14 MED ORDER — CEFAZOLIN SODIUM-DEXTROSE 2-4 GM/100ML-% IV SOLN
INTRAVENOUS | Status: AC
  Filled 2021-04-14: qty 100

## 2021-04-14 MED ORDER — LIDOCAINE HCL (PF) 1 % IJ SOLN
INTRAMUSCULAR | Status: AC
  Filled 2021-04-14: qty 30

## 2021-04-14 SURGICAL SUPPLY — 32 items
ADH SKN CLS APL DERMABOND .7 (GAUZE/BANDAGES/DRESSINGS) ×1
APL PRP STRL LF ISPRP CHG 10.5 (MISCELLANEOUS) ×1
APPLICATOR CHLORAPREP 10.5 ORG (MISCELLANEOUS) ×2 IMPLANT
BAG DECANTER FOR FLEXI CONT (MISCELLANEOUS) ×2 IMPLANT
BLADE SURG 15 STRL LF DISP TIS (BLADE) IMPLANT
BLADE SURG 15 STRL SS (BLADE) ×2
BLADE SURG SZ11 CARB STEEL (BLADE) ×1 IMPLANT
CLOTH BEACON ORANGE TIMEOUT ST (SAFETY) ×2 IMPLANT
COVER LIGHT HANDLE STERIS (MISCELLANEOUS) ×4 IMPLANT
DECANTER SPIKE VIAL GLASS SM (MISCELLANEOUS) ×2 IMPLANT
DERMABOND ADVANCED (GAUZE/BANDAGES/DRESSINGS) ×1
DERMABOND ADVANCED .7 DNX12 (GAUZE/BANDAGES/DRESSINGS) ×1 IMPLANT
DRAPE C-ARM FOLDED MOBILE STRL (DRAPES) ×2 IMPLANT
ELECT REM PT RETURN 9FT ADLT (ELECTROSURGICAL) ×2
ELECTRODE REM PT RTRN 9FT ADLT (ELECTROSURGICAL) ×1 IMPLANT
GLOVE SURG POLYISO LF SZ7.5 (GLOVE) ×2 IMPLANT
GLOVE SURG UNDER POLY LF SZ7 (GLOVE) ×4 IMPLANT
GOWN STRL REUS W/TWL LRG LVL3 (GOWN DISPOSABLE) ×4 IMPLANT
IV NS 500ML (IV SOLUTION) ×2
IV NS 500ML BAXH (IV SOLUTION) ×1 IMPLANT
KIT PORT POWER 8FR ISP MRI (Port) ×2 IMPLANT
KIT TURNOVER KIT A (KITS) ×2 IMPLANT
NDL HYPO 25X1 1.5 SAFETY (NEEDLE) ×1 IMPLANT
NEEDLE HYPO 25X1 1.5 SAFETY (NEEDLE) ×2 IMPLANT
PACK MINOR (CUSTOM PROCEDURE TRAY) ×2 IMPLANT
PAD ARMBOARD 7.5X6 YLW CONV (MISCELLANEOUS) ×2 IMPLANT
SET BASIN LINEN APH (SET/KITS/TRAYS/PACK) ×2 IMPLANT
SUT MNCRL AB 4-0 PS2 18 (SUTURE) ×2 IMPLANT
SUT VIC AB 3-0 SH 27 (SUTURE) ×2
SUT VIC AB 3-0 SH 27X BRD (SUTURE) ×1 IMPLANT
SYR 5ML LL (SYRINGE) ×2 IMPLANT
SYR CONTROL 10ML LL (SYRINGE) ×2 IMPLANT

## 2021-04-14 NOTE — Transfer of Care (Signed)
Immediate Anesthesia Transfer of Care Note ? ?Patient: Diana Fox ? ?Procedure(s) Performed: INSERTION PORT-A-CATH (Left: Chest) ? ?Patient Location: PACU ? ?Anesthesia Type:MAC ? ?Level of Consciousness: awake, alert , oriented and patient cooperative ? ?Airway & Oxygen Therapy: Patient Spontanous Breathing and Patient connected to nasal cannula oxygen ? ?Post-op Assessment: Report given to RN, Post -op Vital signs reviewed and stable and Patient moving all extremities ? ?Post vital signs: Reviewed and stable ? ?Last Vitals:  ?Vitals Value Taken Time  ?BP 139/79 04/14/21 1053  ?Temp    ?Pulse 99 04/14/21 1054  ?Resp 19 04/14/21 1057  ?SpO2 100 % 04/14/21 1054  ?Vitals shown include unvalidated device data. ? ?Last Pain:  ?Vitals:  ? 04/14/21 0939  ?TempSrc: Oral  ?PainSc: 0-No pain  ?   ? ?  ? ?Complications: No notable events documented. ?

## 2021-04-14 NOTE — Anesthesia Preprocedure Evaluation (Signed)
Anesthesia Evaluation  Patient identified by MRN, date of birth, ID band Patient awake    Reviewed: Allergy & Precautions, H&P , NPO status , Patient's Chart, lab work & pertinent test results, reviewed documented beta blocker date and time   Airway Mallampati: II  TM Distance: >3 FB Neck ROM: full    Dental no notable dental hx.    Pulmonary neg pulmonary ROS,    Pulmonary exam normal breath sounds clear to auscultation       Cardiovascular Exercise Tolerance: Good hypertension, negative cardio ROS   Rhythm:regular Rate:Normal     Neuro/Psych negative neurological ROS  negative psych ROS   GI/Hepatic negative GI ROS, Neg liver ROS,   Endo/Other  negative endocrine ROS  Renal/GU negative Renal ROS  negative genitourinary   Musculoskeletal   Abdominal   Peds  Hematology  (+) Blood dyscrasia, anemia ,   Anesthesia Other Findings   Reproductive/Obstetrics negative OB ROS                             Anesthesia Physical Anesthesia Plan  ASA: 2  Anesthesia Plan: General   Post-op Pain Management:    Induction:   PONV Risk Score and Plan: Propofol infusion  Airway Management Planned:   Additional Equipment:   Intra-op Plan:   Post-operative Plan:   Informed Consent: I have reviewed the patients History and Physical, chart, labs and discussed the procedure including the risks, benefits and alternatives for the proposed anesthesia with the patient or authorized representative who has indicated his/her understanding and acceptance.     Dental Advisory Given  Plan Discussed with: CRNA  Anesthesia Plan Comments:         Anesthesia Quick Evaluation  

## 2021-04-14 NOTE — Interval H&P Note (Signed)
History and Physical Interval Note: ? ?04/14/2021 ?9:54 AM ? ?Diana Fox  has presented today for surgery, with the diagnosis of Colon Cancer.  The various methods of treatment have been discussed with the patient and family. After consideration of risks, benefits and other options for treatment, the patient has consented to  Procedure(s): ?INSERTION PORT-A-CATH (N/A) as a surgical intervention.  The patient's history has been reviewed, patient examined, no change in status, stable for surgery.  I have reviewed the patient's chart and labs.  Questions were answered to the patient's satisfaction.   ? ? ?Aviva Signs ? ? ?

## 2021-04-14 NOTE — Op Note (Signed)
Patient:  Diana Fox ? ?DOB:  1970-06-20 ? ?MRN:  [id number] ? ? ?Preop Diagnosis: Metastatic colon carcinoma, need for central venous access ? ?Postop Diagnosis: Same ? ?Procedure: Port-A-Cath insertion ? ?Surgeon: Aviva Signs, MD ? ?Anes: MAC ? ?Indications: Patient is a 51 year old white female recently diagnosed with metastatic colorectal carcinoma who presents for Port-A-Cath placement.  The risks and benefits of the procedure including bleeding, infection, and pneumothorax were fully explained to the patient, who gave informed consent. ? ?Procedure note: The patient was placed in the Trendelenburg position after the left upper chest was prepped and draped using usual sterile technique with ChloraPrep.  Surgical site confirmation was performed.  1% Xylocaine was used for local anesthesia. ? ?An incision was made below the left clavicle.  A subcutaneous pocket was formed.  The needle was advanced into the left subclavian vein using the Seldinger technique without difficulty.  A guidewire was then advanced into the right atrium under fluoroscopic guidance.  An introducer and peel-away sheath were placed over the guidewire.  The catheter was inserted through the peel-away sheath and the peel-away sheath was removed.  The catheter was then attached to the port and the port placed in subcutaneous pocket.  Adequate positioning was confirmed by fluoroscopy.  Good backflow of venous blood was noted on aspiration of the port.  The port was flushed with heparin flush.  Subcutaneous layer was reapproximated using 3-0 Vicryl interrupted suture.  The skin was closed using a 4-0 Monocryl subcuticular suture.  Dermabond was applied. ? ?All tape and needle counts were correct at the end of the procedure.  The patient was awakened and transferred to PACU in stable condition.  A chest x-ray will be performed at that time. ? ?Complications: None ? ?EBL: Minimal ? ?Specimen: None ? ? ?  ?

## 2021-04-14 NOTE — Anesthesia Postprocedure Evaluation (Signed)
Anesthesia Post Note ? ?Patient: Oluchi Pucci ? ?Procedure(s) Performed: INSERTION PORT-A-CATH (Left: Chest) ? ?Patient location during evaluation: Phase II ?Anesthesia Type: General ?Level of consciousness: awake ?Pain management: pain level controlled ?Vital Signs Assessment: post-procedure vital signs reviewed and stable ?Respiratory status: spontaneous breathing and respiratory function stable ?Cardiovascular status: blood pressure returned to baseline and stable ?Postop Assessment: no headache and no apparent nausea or vomiting ?Anesthetic complications: no ?Comments: Late entry ? ? ?No notable events documented. ? ? ?Last Vitals:  ?Vitals:  ? 04/14/21 1115 04/14/21 1133  ?BP: 135/81 128/88  ?Pulse:    ?Resp: (!) 23 20  ?Temp:  37 ?C  ?SpO2:  100%  ?  ?Last Pain:  ?Vitals:  ? 04/14/21 1133  ?TempSrc: Oral  ?PainSc: 0-No pain  ? ? ?  ?  ?  ?  ?  ?  ? ?Louann Sjogren ? ? ? ? ?

## 2021-04-16 ENCOUNTER — Encounter (HOSPITAL_COMMUNITY): Payer: Self-pay | Admitting: General Surgery

## 2021-04-16 ENCOUNTER — Other Ambulatory Visit: Payer: Self-pay

## 2021-04-16 ENCOUNTER — Ambulatory Visit (HOSPITAL_COMMUNITY)
Admission: RE | Admit: 2021-04-16 | Discharge: 2021-04-16 | Disposition: A | Discharge status: Home / Self Care | Payer: Medicaid Other | Source: Ambulatory Visit | Attending: Nurse Practitioner | Admitting: Nurse Practitioner

## 2021-04-16 DIAGNOSIS — C189 Malignant neoplasm of colon, unspecified: Secondary | ICD-10-CM | POA: Diagnosis not present

## 2021-04-16 DIAGNOSIS — R110 Nausea: Secondary | ICD-10-CM | POA: Insufficient documentation

## 2021-04-16 DIAGNOSIS — K6289 Other specified diseases of anus and rectum: Secondary | ICD-10-CM | POA: Diagnosis not present

## 2021-04-16 DIAGNOSIS — M2550 Pain in unspecified joint: Secondary | ICD-10-CM | POA: Diagnosis not present

## 2021-04-16 DIAGNOSIS — Z433 Encounter for attention to colostomy: Secondary | ICD-10-CM | POA: Insufficient documentation

## 2021-04-16 DIAGNOSIS — R634 Abnormal weight loss: Secondary | ICD-10-CM | POA: Insufficient documentation

## 2021-04-16 NOTE — Discharge Instructions (Signed)
Your pouching is now:  ?Remove pouch and clean skin with mild soap and water ?Apply light dusting of powder around stoma and brush off excess ?Seal in powder with skin prep pad.  Allow to dry for 5 seconds.   ?Apply barrier ring ?Cut barrier opening on pouch (1 1/2")  ?Apply pouch ?Warm system to your skin with heating pad, pillow or your hand for 3- 5 minutes ?Change twice weekly.  ?

## 2021-04-16 NOTE — Progress Notes (Signed)
[facility]  ? ?Reason for visit:  ?LLQ colostomy ?HPI:  ?Colon carcinoma with resection and colostomy ?ROS  ?Review of Systems  ?Constitutional:  Positive for unexpected weight change.  ?     Weight loss reported.   ?Gastrointestinal:  Positive for nausea and rectal pain.  ?     Some pain at stoma and rectum.  Good output from stoma. Encouraged to take colace daily. Is on narcotic analgesia, explained that this could constipate her.   ?Musculoskeletal:  Positive for arthralgias.  ?     Achy, dull pain all over, she reports.   ?Skin: Negative.   ?Psychiatric/Behavioral:    ?     Is leaning heavily on her faith and beliefs that GOd will heal her. Her spouse is caring for her at home and her children are very involved as well.   ?All other systems reviewed and are negative. ?Vital signs:  ?BP (!) 139/97 (BP Location: Right Arm)   Pulse (!) 102   Temp 98.6 ?F (37 ?C) (Oral)   Resp 20   SpO2 98%  ?Exam:  ?Physical Exam  ?Stoma type/location:  LLQ colostomy ?Stomal assessment/size:  1 1/2" pink patent and producing soft brown stool ?Peristomal assessment:  intact ?Treatment options for stomal/peristomal skin: barrier ring and 2 piece 2 3/4" pouch ?Output: soft brown stool ?Ostomy pouching: 2pc. 2 3/4" wiith barrier ring  ?Education provided:  Due to pancaking, we added lubricating deodorant to facilitate stool falling to bottom of pouch ? ?  ?Impression/dx  ?colostomy ?Discussion  ?See back in one week.  ?Plan  ?Set up with [facility] ? ? ? ?Visit time: 55 minutes.  ? ?Domenic Moras FNP-BC ? ?  ?

## 2021-04-19 NOTE — Addendum Note (Signed)
Addendum  created 04/19/21 1326 by Orlie Dakin, CRNA  ? Intraprocedure Staff edited  ?  ?

## 2021-04-22 ENCOUNTER — Other Ambulatory Visit: Payer: Self-pay

## 2021-04-22 ENCOUNTER — Encounter: Payer: Self-pay | Admitting: General Surgery

## 2021-04-22 ENCOUNTER — Ambulatory Visit (INDEPENDENT_AMBULATORY_CARE_PROVIDER_SITE_OTHER): Payer: Medicaid Other | Admitting: General Surgery

## 2021-04-22 ENCOUNTER — Ambulatory Visit (HOSPITAL_COMMUNITY)
Admission: RE | Admit: 2021-04-22 | Discharge: 2021-04-22 | Disposition: A | Discharge status: Home / Self Care | Payer: Medicaid Other | Source: Ambulatory Visit | Attending: Hematology | Admitting: Hematology

## 2021-04-22 VITALS — BP 103/73 | HR 50 | Temp 98.6°F | Resp 14 | Ht 65.0 in | Wt 144.0 lb

## 2021-04-22 DIAGNOSIS — C189 Malignant neoplasm of colon, unspecified: Secondary | ICD-10-CM | POA: Insufficient documentation

## 2021-04-22 NOTE — Progress Notes (Signed)
Subjective:  ?  ? Diana Fox  ?Patient here for unexpected postoperative visit.  States she was having some lower incisional pain.  This occurred after she had to take some MiraLAX for constipation.  She is about to start her chemotherapy next week.  She has had no drainage from the wound. ?Objective:  ? ? BP 103/73   Pulse (!) 50   Temp 98.6 ?F (37 ?C) (Oral)   Resp 14   Ht '5\' 5"'$  (1.651 m)   Wt 144 lb (65.3 kg)   SpO2 98%   BMI 23.96 kg/m?  ? ?General:  alert, cooperative, and fatigued  ?Abdomen is soft with some bloating present.  Her midline incision is healing well without drainage.  It is tender to touch along the muscle.  Her ostomy is pink and patent. ?   ? ?Assessment:  ? ? Incisional pain, status post Hartman's procedure  ?  ?Plan:  ? ?I reassured the patient that her wound was healing well.  She should continue MiraLAX as needed.  She will also add Colace to help her bowel movements through her colostomy.  Follow-up as needed. ?

## 2021-04-22 NOTE — Patient Instructions (Signed)
Colace one tablet twice a day ?

## 2021-04-23 ENCOUNTER — Other Ambulatory Visit: Payer: Self-pay

## 2021-04-23 ENCOUNTER — Ambulatory Visit
Admission: EM | Admit: 2021-04-23 | Discharge: 2021-04-23 | Disposition: A | Discharge status: Home / Self Care | Payer: Medicaid Other

## 2021-04-23 ENCOUNTER — Ambulatory Visit (HOSPITAL_COMMUNITY): Payer: Medicaid Other

## 2021-04-26 ENCOUNTER — Encounter (HOSPITAL_COMMUNITY)
Admission: RE | Admit: 2021-04-26 | Discharge: 2021-04-26 | Disposition: A | Discharge status: Home / Self Care | Payer: Medicaid Other | Source: Ambulatory Visit | Attending: Plastic Surgery | Admitting: Plastic Surgery

## 2021-04-26 DIAGNOSIS — K9400 Colostomy complication, unspecified: Secondary | ICD-10-CM

## 2021-04-26 DIAGNOSIS — Z01818 Encounter for other preprocedural examination: Secondary | ICD-10-CM | POA: Diagnosis present

## 2021-04-26 DIAGNOSIS — Z433 Encounter for attention to colostomy: Secondary | ICD-10-CM | POA: Insufficient documentation

## 2021-04-26 DIAGNOSIS — K5900 Constipation, unspecified: Secondary | ICD-10-CM | POA: Diagnosis not present

## 2021-04-26 DIAGNOSIS — C189 Malignant neoplasm of colon, unspecified: Secondary | ICD-10-CM | POA: Diagnosis not present

## 2021-04-26 NOTE — Discharge Instructions (Signed)
See you Tuesday at 1130 ?Continue miralax ?Walking ?Can mix MOM with prune juice, warm it for laxative ?

## 2021-04-26 NOTE — Progress Notes (Signed)
[facility]  ? ?Reason for visit:  ?LLQ colostomy ?HPI:  ?Colon cancer with end colostomy ?ROS  ?Review of Systems  ?Constitutional:  Positive for fatigue.  ?Gastrointestinal:  Positive for constipation.  ?     Constipation ?Pain in RLQ  ?Skin:   ?     Contact dermatitis to peristomal skin  ?All other systems reviewed and are negative. ?Vital signs:  ?BP (!) 156/95 (BP Location: Right Arm)   Pulse (!) 102   Temp 97.9 ?F (36.6 ?C) (Oral)   Resp 17   SpO2 100%  ?Exam:  ?Physical Exam ?Constitutional:   ?   Appearance: Normal appearance.  ?Abdominal:  ?   Comments: Firmness in RLQ abdomen ?Abdominal pain  ?Skin: ?   Comments: Peristomal dermatitis  ?Neurological:  ?   Mental Status: She is alert.  ?  ?Stoma type/location:  LLQ colostomy ?Stomal assessment/size:  1 1/2" well budded ?Peristomal assessment: intact  added barrier ring last visit.  Pouching improved ?Treatment options for stomal/peristomal skin: barrier ring and 2 piece pouch ?Output: thick brown stool ?Ostomy pouching: 2pc. 2 3/4" pouch with barrier ring  ?Education provided:  Patient experiencing pain in RLQ  Saw Dr Arnoldo Morale yesterday  Has been taking miralax and colace ? ?  ?Impression/dx  ?constipation ?Discussion  ?See back in one week ?Begins chemotherapy this week.  ?Plan  ?See in one week.  ? ? ? ?Visit time: 50 minutes.  ? ?Domenic Moras FNP-BC ? ?  ?

## 2021-04-27 ENCOUNTER — Emergency Department (HOSPITAL_COMMUNITY)
Admission: EM | Admit: 2021-04-27 | Discharge: 2021-04-27 | Disposition: A | Discharge status: Home / Self Care | Payer: Medicaid Other | Source: Home / Self Care

## 2021-04-27 ENCOUNTER — Ambulatory Visit (HOSPITAL_COMMUNITY): Payer: Medicaid Other | Admitting: Hematology

## 2021-04-28 ENCOUNTER — Emergency Department (HOSPITAL_COMMUNITY): Payer: Medicaid Other

## 2021-04-28 ENCOUNTER — Other Ambulatory Visit: Payer: Self-pay

## 2021-04-28 ENCOUNTER — Emergency Department (HOSPITAL_COMMUNITY)
Admission: EM | Admit: 2021-04-28 | Discharge: 2021-04-28 | Disposition: A | Discharge status: Home / Self Care | Payer: Medicaid Other | Attending: Emergency Medicine | Admitting: Emergency Medicine

## 2021-04-28 ENCOUNTER — Encounter (HOSPITAL_COMMUNITY): Payer: Self-pay

## 2021-04-28 DIAGNOSIS — R000 Tachycardia, unspecified: Secondary | ICD-10-CM | POA: Diagnosis not present

## 2021-04-28 DIAGNOSIS — Y833 Surgical operation with formation of external stoma as the cause of abnormal reaction of the patient, or of later complication, without mention of misadventure at the time of the procedure: Secondary | ICD-10-CM | POA: Diagnosis not present

## 2021-04-28 DIAGNOSIS — Z85028 Personal history of other malignant neoplasm of stomach: Secondary | ICD-10-CM | POA: Insufficient documentation

## 2021-04-28 DIAGNOSIS — Z48815 Encounter for surgical aftercare following surgery on the digestive system: Secondary | ICD-10-CM | POA: Diagnosis present

## 2021-04-28 DIAGNOSIS — T8149XA Infection following a procedure, other surgical site, initial encounter: Secondary | ICD-10-CM | POA: Insufficient documentation

## 2021-04-28 DIAGNOSIS — Z5189 Encounter for other specified aftercare: Secondary | ICD-10-CM

## 2021-04-28 LAB — COMPREHENSIVE METABOLIC PANEL
ALT: 28 U/L (ref 0–44)
AST: 26 U/L (ref 15–41)
Albumin: 2.7 g/dL — ABNORMAL LOW (ref 3.5–5.0)
Alkaline Phosphatase: 282 U/L — ABNORMAL HIGH (ref 38–126)
Anion gap: 12 (ref 5–15)
BUN: 7 mg/dL (ref 6–20)
CO2: 24 mmol/L (ref 22–32)
Calcium: 8.9 mg/dL (ref 8.9–10.3)
Chloride: 101 mmol/L (ref 98–111)
Creatinine, Ser: 0.66 mg/dL (ref 0.44–1.00)
GFR, Estimated: >60 mL/min (ref >60)
Glucose, Bld: 96 mg/dL (ref 70–99)
Potassium: 3.4 mmol/L — ABNORMAL LOW (ref 3.5–5.1)
Sodium: 137 mmol/L (ref 135–145)
Total Bilirubin: 0.6 mg/dL (ref 0.3–1.2)
Total Protein: 8.4 g/dL — ABNORMAL HIGH (ref 6.5–8.1)

## 2021-04-28 LAB — CBC WITH DIFFERENTIAL/PLATELET
Abs Immature Granulocytes: 0.17 10*3/uL — ABNORMAL HIGH (ref 0.00–0.07)
Basophils Absolute: 0.1 10*3/uL (ref 0.0–0.1)
Basophils Relative: 1 %
Eosinophils Absolute: 0.1 10*3/uL (ref 0.0–0.5)
Eosinophils Relative: 1 %
HCT: 30.7 % — ABNORMAL LOW (ref 36.0–46.0)
Hemoglobin: 9.7 g/dL — ABNORMAL LOW (ref 12.0–15.0)
Immature Granulocytes: 2 %
Lymphocytes Relative: 12 %
Lymphs Abs: 1.3 10*3/uL (ref 0.7–4.0)
MCH: 25.3 pg — ABNORMAL LOW (ref 26.0–34.0)
MCHC: 31.6 g/dL (ref 30.0–36.0)
MCV: 79.9 fL — ABNORMAL LOW (ref 80.0–100.0)
Monocytes Absolute: 0.7 10*3/uL (ref 0.1–1.0)
Monocytes Relative: 6 %
Neutro Abs: 8.8 10*3/uL — ABNORMAL HIGH (ref 1.7–7.7)
Neutrophils Relative %: 78 %
Platelets: 567 10*3/uL — ABNORMAL HIGH (ref 150–400)
RBC: 3.84 MIL/uL — ABNORMAL LOW (ref 3.87–5.11)
RDW: 15.9 % — ABNORMAL HIGH (ref 11.5–15.5)
WBC: 11.1 10*3/uL — ABNORMAL HIGH (ref 4.0–10.5)
nRBC: 0 % (ref 0.0–0.2)

## 2021-04-28 LAB — LACTIC ACID, PLASMA
Lactic Acid, Venous: 1.1 mmol/L (ref 0.5–1.9)
Lactic Acid, Venous: 0.9 mmol/L (ref 0.5–1.9)

## 2021-04-28 LAB — LIPASE, BLOOD: Lipase: 38 U/L (ref 11–51)

## 2021-04-28 MED ORDER — SODIUM CHLORIDE 0.9 % IV BOLUS
1000.0000 mL | Freq: Once | INTRAVENOUS | Status: AC
  Administered 2021-04-28: 1000 mL via INTRAVENOUS

## 2021-04-28 MED ORDER — VANCOMYCIN HCL 1750 MG/350ML IV SOLN: 1750.0000 mg | Freq: Once | INTRAVENOUS | Status: DC

## 2021-04-28 MED ORDER — VANCOMYCIN HCL 750 MG/150ML IV SOLN
750.0000 mg | Freq: Three times a day (TID) | INTRAVENOUS | Status: DC
  Filled 2021-04-28 (×2): qty 150

## 2021-04-28 MED ORDER — PIPERACILLIN-TAZOBACTAM 3.375 G IVPB 30 MIN
3.3750 g | Freq: Once | INTRAVENOUS | Status: DC
  Filled 2021-04-28: qty 50

## 2021-04-28 MED ORDER — MORPHINE SULFATE (PF) 4 MG/ML IV SOLN: 4.0000 mg | Freq: Once | INTRAVENOUS | Status: DC

## 2021-04-28 MED ORDER — IOHEXOL 300 MG/ML  SOLN
100.0000 mL | Freq: Once | INTRAMUSCULAR | Status: AC | PRN
  Administered 2021-04-28: 100 mL via INTRAVENOUS

## 2021-04-28 MED ORDER — SODIUM CHLORIDE 0.9 % IV SOLN
1.5000 g | Freq: Once | INTRAVENOUS | Status: AC
  Administered 2021-04-28: 1.5 g via INTRAVENOUS
  Filled 2021-04-28: qty 4

## 2021-04-28 MED ORDER — AMOXICILLIN-POT CLAVULANATE 875-125 MG PO TABS: 1.0000 | ORAL_TABLET | Freq: Two times a day (BID) | ORAL | 0 refills | Status: DC

## 2021-04-28 NOTE — ED Triage Notes (Addendum)
Patient arrived via EMS with complaints of abdominal bleeding post surgery 3/17. Patient states she woke up this morning to bleeding around the site and thinks a stitch has come out. Patient states the site was leaking blood and stool like substance.  ?

## 2021-04-28 NOTE — ED Notes (Signed)
Patient transported to CT 

## 2021-04-28 NOTE — ED Provider Notes (Signed)
?[facility] ?Provider Note ? ? ?CSN: [id number] ?Arrival date & time: 04/28/21  1107 ? ?  ? ?History ? ?Chief Complaint  ?Patient presents with  ? Wound Check  ? Post-op Problem  ? ? ?Diana Fox is a 51 y.o. female. ? ?Presents with chief complaint of drainage from her surgical wound.  She is notices for 2 days now.  Otherwise denies any fevers or cough or vomiting or diarrhea.  She had a partial colectomy last month.  She called her surgeon notifying them of the drainage she has noticed.  Directed to come to the ER for evaluation.  History of diffuse abdominal cancer. ? ? ?  ? ?Home Medications ?Prior to Admission medications   ?Medication Sig Start Date End Date Taking? Authorizing Provider  ?ferrous sulfate 325 (65 FE) MG tablet TAKE 325 MG BY MOUTH DAILY. ?Patient taking differently: 325 mg daily with breakfast. 02/28/21  Yes Mahala Menghini, PA-C  ?HYDROcodone-acetaminophen (NORCO) 7.5-325 MG tablet Take 1 tablet by mouth every 6 (six) hours as needed for moderate pain. 04/14/21  Yes Aviva Signs, MD  ?medroxyPROGESTERone (DEPO-PROVERA) 150 MG/ML injection Inject 150 mg into the muscle every 3 (three) months. 03/25/21  Yes [provider]  ?medroxyPROGESTERone (PROVERA) 5 MG tablet Take 5 mg by mouth every other day.   Yes [provider]  ?Multiple Vitamin (MULTIVITAMIN) tablet Take 1 tablet by mouth daily.   Yes [provider]  ?nebivolol (BYSTOLIC) 10 MG tablet Take 1 tablet (10 mg total) by mouth daily. 08/10/15  Yes Lily Kocher, PA-C  ?pantoprazole (PROTONIX) 40 MG tablet Take 40 mg by mouth daily as needed (acid reflux). 02/23/21  Yes [provider]  ?fexofenadine (ALLEGRA) 180 MG tablet Take 1 tablet (180 mg total) by mouth daily. ?Patient not taking: Reported on 04/28/2021 11/16/20   Jaynee Eagles, PA-C  ?fluticasone [facility]) 50 MCG/ACT nasal spray Place 2 sprays into both nostrils daily. ?Patient not taking: Reported on 04/28/2021 11/16/20   Jaynee Eagles, PA-C  ?ondansetron (ZOFRAN) 4 MG tablet Take 1 tablet (4 mg total) by mouth every 8 (eight) hours as needed for nausea or vomiting. ?Patient not taking: Reported on 04/07/2021 04/02/21   Aviva Signs, MD  ?   ? ?Allergies    ?Azithromycin, Motrin [ibuprofen], Lisinopril, and Sudafed [pseudoephedrine hcl]   ? ?Review of Systems   ?Review of Systems  ?Constitutional:  Negative for fever.  ?HENT:  Negative for ear pain.   ?Eyes:  Negative for pain.  ?Respiratory:  Negative for cough.   ?Cardiovascular:  Negative for chest pain.  ?Gastrointestinal:  Negative for vomiting.  ?Genitourinary:  Negative for flank pain.  ?Musculoskeletal:  Negative for back pain.  ?Skin:  Negative for rash.  ?Neurological:  Negative for headaches.  ? ?Physical Exam ?Updated Vital Signs ?BP (!) 142/75   Pulse 93   Temp 98.6 ?F (37 ?C) (Oral)   Resp 18   Ht '5\' 5"'$  (1.651 m)   Wt 70.3 kg   SpO2 100%   BMI 25.79 kg/m?  ?Physical Exam ?Constitutional:   ?   General: She is not in acute distress. ?   Appearance: Normal appearance.  ?HENT:  ?   Head: Normocephalic.  ?   Nose: Nose normal.  ?Eyes:  ?   Extraocular Movements: Extraocular movements intact.  ?Cardiovascular:  ?   Rate and Rhythm: Normal rate.  ?Pulmonary:  ?   Effort: Pulmonary effort is normal.  ?Abdominal:  ?   Comments:  Patient is abdomen is moderately distended, mild diffuse tenderness noted.  Lower aspect of surgical incision has a small draining lesion with brownish fluid draining.  Question possible fistula.  ?Musculoskeletal:     ?   General: Normal range of motion.  ?   Cervical back: Normal range of motion.  ?Neurological:  ?   General: No focal deficit present.  ?   Mental Status: She is alert. Mental status is at baseline.  ? ? ?ED Results / Procedures / Treatments   ?Labs ?(all labs ordered are listed, but only abnormal results are displayed) ?Labs Reviewed  ?CBC WITH DIFFERENTIAL/PLATELET - Abnormal; Notable for the following components:  ?    Result Value  ?  WBC 11.1 (*)   ? RBC 3.84 (*)   ? Hemoglobin 9.7 (*)   ? HCT 30.7 (*)   ? MCV 79.9 (*)   ? MCH 25.3 (*)   ? RDW 15.9 (*)   ? Platelets 567 (*)   ? Neutro Abs 8.8 (*)   ? Abs Immature Granulocytes 0.17 (*)   ? All other components within normal limits  ?COMPREHENSIVE METABOLIC PANEL - Abnormal; Notable for the following components:  ? Potassium 3.4 (*)   ? Total Protein 8.4 (*)   ? Albumin 2.7 (*)   ? Alkaline Phosphatase 282 (*)   ? All other components within normal limits  ?CULTURE, BLOOD (ROUTINE X 2)  ?CULTURE, BLOOD (ROUTINE X 2)  ?LIPASE, BLOOD  ?LACTIC ACID, PLASMA  ?LACTIC ACID, PLASMA  ? ? ?EKG ?None ? ?Radiology ?No results found. ? ?Procedures ?Procedures  ? ? ?Medications Ordered in ED ?Medications  ?morphine (PF) 4 MG/ML injection 4 mg (4 mg Intravenous Patient Refused/Not Given 04/28/21 1224)  ?sodium chloride 0.9 % bolus 1,000 mL (1,000 mLs Intravenous New Bag/Given 04/28/21 1224)  ? ? ?ED Course/ Medical Decision Making/ A&P ?  ?                        ?Medical Decision Making ?Amount and/or Complexity of Data Reviewed ?Labs: ordered. ?Radiology: ordered. ? ?Risk ?Prescription drug management. ? ? ?Chart review shows office visit with general surgery 2 days ago on outpatient basis. ? ?History obtained from the patient and family member at bedside. ? ?Cardiac monitor shows sinus rhythm mild tachycardic rate. ? ?Labs unremarkable lactic acid normal. ? ?Consultation with surgery Dr. Arnoldo Morale, who came and saw the patient as well.  Recommending CT scan and dispo accordingly. ? ? ? ? ? ? ? ? ? ?Final Clinical Impression(s) / ED Diagnoses ?Final diagnoses:  ?Visit for wound check  ? ? ?Rx / DC Orders ?ED Discharge Orders   ? ? None  ? ?  ? ? ?  ?Luna Fuse, MD ?04/28/21 1420 ? ?

## 2021-04-28 NOTE — ED Notes (Signed)
Abdominal dressing leaking red and purulent drainage. New dressing placed at this time.  ?

## 2021-04-28 NOTE — ED Provider Notes (Signed)
?  Physical Exam  ?BP (!) 142/86   Pulse 88   Temp 98.6 ?F (37 ?C) (Oral)   Resp 18   Ht '5\' 5"'$  (1.651 m)   Wt 70.3 kg   SpO2 100%   BMI 25.79 kg/m?  ? ?Physical Exam ? ?Procedures  ?Procedures ? ?ED Course / MDM  ?  ?Medical Decision Making ?Amount and/or Complexity of Data Reviewed ?Labs: ordered. ?Radiology: ordered. ? ?Risk ?Prescription drug management. ? ? ?Received patient in signout.  Abdominal pain and swelling.  Recent surgery and has stage IV cancer.  White count 11.  CT scan shows intra-abdominal and other extensive abscess and possibly hematoma.  Patient does not appear to be septic.  Discussed with Dr. Arnoldo Morale who reviewed CT and it come to see patient.  Patient is eager to go home and he feels that patient would be stable for it.  Office will call tomorrow.  We will start Augmentin and has been given a dose of Unasyn here.  Wound is draining and ostomy bag will be placed.  Will discharge home ? ? ? ? ?  ?Davonna Belling, MD ?04/28/21 1733 ? ?

## 2021-04-28 NOTE — Discharge Instructions (Addendum)
There appears to be an abscess in her abdomen.  Appears to be draining fortunately.  Dr. Arnoldo Morale office will call you tomorrow.  Return for worsening symptoms.  Return for fevers. ?

## 2021-04-28 NOTE — Consult Note (Signed)
Corder Nurse ostomy consult note ?Midline wound drainage foul smelling effluent ?Stoma type/location: LLQ colostomy ?Stomal assessment/size: 1 1/2" pink and moist ?Peristomal assessment: not assessed ?Treatment options for stomal/peristomal skin: 2 piece pouch with barrier ring ?Supplies to be ordered:  ?LAwson # 649 pouch ?Kellie Simmering # 2 barrier ?Kellie Simmering [phone] barrier ring ?Awaiting CT scan abdomen.  Ongoing abdominal pain and now foul smelling drainage from surgical wound.  ?Will not follow at this time.  Please re-consult if needed.  ?Domenic Moras MSN, RN, FNP-BC CWON ?Wound, Ostomy, Continence Nurse ?Pager [phone]  ?

## 2021-04-28 NOTE — Progress Notes (Signed)
Pharmacy Antibiotic Note ? ?Diana Fox a 51 y.o. female admitted on 04/28/2021 with  surgical wound infection .  Pharmacy has been consulted for vancomycin dosing. ? ?Plan: ?Vancomycin '750mg'$  IV every 8 hours.  Goal trough 15-20 mcg/mL. ? ?Medical History: ?Past Medical History:  ?Diagnosis Date  ? Anemia   ? Back pain   ? Hypertension   ? ? ?Allergies:  ?Allergies  ?Allergen Reactions  ? Azithromycin Hives  ?  infusing arm began to swell , her face got red, and she broke out, there are bumps around where the IV site was infusing  ? Motrin [Ibuprofen] Itching  ? Lisinopril   ?  cough  ? Sudafed [Pseudoephedrine Hcl] Other (See Comments)  ?  Hypertension  ? ? ?Filed Weights  ? 04/28/21 1116  ?Weight: 70.3 kg (155 lb)  ? ? ? ?  Latest Ref Rng & Units 04/28/2021  ? 12:25 PM 03/30/2021  ?  4:27 AM 03/29/2021  ?  4:49 AM  ?CBC  ?WBC 4.0 - 10.5 K/uL 11.1   8.9   8.7    ?Hemoglobin 12.0 - 15.0 g/dL 9.7   8.8   8.6    ?Hematocrit 36.0 - 46.0 % 30.7   29.2   28.2    ?Platelets 150 - 400 K/uL 567   418   361    ? ? ? ?Estimated Creatinine Clearance: 82.7 mL/min (by C-G formula based on SCr of 0.66 mg/dL). ? ?Antibiotics Given (last 72 hours)   ? ? None  ? ?  ? ? ?Antimicrobials this admission: ?vancomycin 04/28/2021  >>  ? ?Microbiology results: ?04/28/2021  BCx: sent ?04/28/2021  UCx: sent ?04/28/2021  Resp Panel: sent  ?04/28/2021  MRSA PCR: sent ? ?Thank you for allowing pharmacy to be a part of this patient?s care. ? ?Thomasenia Sales, PharmD ?Clinical Pharmacist ? ? ?

## 2021-04-28 NOTE — ED Notes (Addendum)
Upon assessment drainage from surgical site coming through wound dressing. Dressing changed at this time. Bloody and purulent drainage noted to wound site and foul odor. ?

## 2021-04-29 ENCOUNTER — Ambulatory Visit (HOSPITAL_COMMUNITY)
Admission: RE | Admit: 2021-04-29 | Discharge: 2021-04-29 | Disposition: A | Discharge status: Home / Self Care | Payer: Medicaid Other | Source: Ambulatory Visit | Attending: Hematology | Admitting: Hematology

## 2021-04-29 ENCOUNTER — Telehealth: Payer: Self-pay | Admitting: *Deleted

## 2021-04-29 DIAGNOSIS — C189 Malignant neoplasm of colon, unspecified: Secondary | ICD-10-CM | POA: Diagnosis not present

## 2021-04-29 DIAGNOSIS — D259 Leiomyoma of uterus, unspecified: Secondary | ICD-10-CM | POA: Diagnosis present

## 2021-04-29 MED ORDER — FLUDEOXYGLUCOSE F - 18 (FDG) INJECTION
8.3800 | Freq: Once | INTRAVENOUS | Status: AC | PRN
  Administered 2021-04-29: 8.38 via INTRAVENOUS

## 2021-04-29 NOTE — Telephone Encounter (Signed)
Received call from patient.  ? ?Reports that she is feeling much improved with drain in abdominal abscess at this time.  ? ?States that she will begin ABTx after imaging this afternoon.  ? ?Advised Dr. Arnoldo Morale will call to follow up after imaging.  ? ? ?

## 2021-05-03 ENCOUNTER — Encounter (HOSPITAL_COMMUNITY): Payer: Self-pay

## 2021-05-03 LAB — CULTURE, BLOOD (ROUTINE X 2)
Culture: NO GROWTH
Culture: NO GROWTH
Special Requests: ADEQUATE

## 2021-05-04 ENCOUNTER — Other Ambulatory Visit: Payer: Self-pay

## 2021-05-04 ENCOUNTER — Encounter (HOSPITAL_COMMUNITY)
Admission: RE | Admit: 2021-05-04 | Discharge: 2021-05-04 | Disposition: A | Discharge status: Home / Self Care | Payer: Medicaid Other | Source: Ambulatory Visit | Attending: Nurse Practitioner | Admitting: Nurse Practitioner

## 2021-05-04 ENCOUNTER — Inpatient Hospital Stay (HOSPITAL_BASED_OUTPATIENT_CLINIC_OR_DEPARTMENT_OTHER): Payer: Medicaid Other | Admitting: Hematology

## 2021-05-04 ENCOUNTER — Telehealth: Payer: Self-pay | Admitting: *Deleted

## 2021-05-04 VITALS — BP 118/76 | HR 60 | Temp 98.4°F | Resp 18 | Ht 65.0 in | Wt 132.1 lb

## 2021-05-04 DIAGNOSIS — C187 Malignant neoplasm of sigmoid colon: Secondary | ICD-10-CM | POA: Diagnosis not present

## 2021-05-04 DIAGNOSIS — Z433 Encounter for attention to colostomy: Secondary | ICD-10-CM | POA: Diagnosis present

## 2021-05-04 DIAGNOSIS — C189 Malignant neoplasm of colon, unspecified: Secondary | ICD-10-CM | POA: Diagnosis not present

## 2021-05-04 NOTE — Telephone Encounter (Signed)
Patient seen in office to have open wound for abscess assessed.  ? ?Noted lower abdominal abscess open approximately 1.5 cm draining into ostomy bag with wax wafer in place. Noted scant tan drainage from abscess in upper collection bag.  ? ?Patient denies fever, chills, pain, tenderness to palpation, or warmth to surrounding skin.  ? ?Patient continues with Augmentin. No reported issues with ABTx.  ? ?Ostomy noted in upper L side of abdomen. Stoma pink and moist.  ? ?Dr. Arnoldo Morale made aware.  ?

## 2021-05-04 NOTE — Progress Notes (Signed)
START ON PATHWAY REGIMEN - Colorectal ? ? ?  A cycle is every 14 days: ?    Panitumumab  ?    Oxaliplatin  ?    Leucovorin  ?    Fluorouracil  ?    Fluorouracil  ? ?**Always confirm dose/schedule in your pharmacy ordering system** ? ?Patient Characteristics: ?Distant Metastases, Nonsurgical Candidate, KRAS/NRAS Wild-Type (BRAF V600 Wild-Type/Unknown), Standard Cytotoxic Therapy, First Line Standard Cytotoxic Therapy, Left-sided Tumor, PS = 0,1 ?Tumor Location: Colon ?Therapeutic Status: Distant Metastases ?Microsatellite/Mismatch Repair Status: MSS/pMMR ?BRAF Mutation Status: Wild-Type (no mutation) ?KRAS/NRAS Mutation Status: Wild-Type (no mutation) ?Preferred Therapy Approach: Standard Cytotoxic Therapy ?Standard Cytotoxic Line of Therapy: First Line Standard Cytotoxic Therapy ?ECOG Performance Status: 0 ?Primary Tumor Location: Left-sided Tumor ?Intent of Therapy: ?Non-Curative / Palliative Intent, Discussed with Patient ?

## 2021-05-04 NOTE — Progress Notes (Signed)
? ?[facility] ?[address] ?[place], [place] [address] ? ? ?CLINIC:  ?Medical Oncology/Hematology ? ?PCP:  ?Caryl Bis, MD ?[address] [place] [address] ?[phone] ? ? ?REASON FOR VISIT:  ?Follow-up for colon cancer ? ?PRIOR THERAPY: none ? ?NGS Results: not done ? ?CURRENT THERAPY: under work-up ? ?BRIEF ONCOLOGIC HISTORY:  ?Oncology History  ?Colon carcinoma ([facility])  ?03/26/2021 Initial Diagnosis  ? Colon carcinoma ([facility]) ?  ?04/06/2021 Cancer Staging  ? Staging form: Colon and Rectum, AJCC 8th Edition ?- Clinical stage from 04/06/2021: Stage IVC (cT4a, cN2b, pM1c) - Signed by Derek Jack, MD on 05/04/2021 ?Histopathologic type: Adenocarcinoma, NOS ?Stage prefix: Initial diagnosis ?Total positive nodes: 9 ?Total nodes examined: 9 ?Tumor deposits (TD): Present ?Carcinoembryonic antigen (CEA) (ng/mL): 61.5 ?Perineural invasion (PNI): Present ?Microsatellite instability (MSI): Stable ?KRAS mutation: Negative ?NRAS mutation: Negative ?BRAF mutation: Negative ? ?  ?05/17/2021 -  Chemotherapy  ? Patient is on Treatment Plan : COLORECTAL FOLFOX + Panitumumab q14d  ?   ? ? ?CANCER STAGING: ? Cancer Staging  ?Colon carcinoma ([facility]) ?Staging form: Colon and Rectum, AJCC 8th Edition ?- Clinical stage from 04/06/2021: Stage IVC (cT4a, cN2b, pM1c) - Signed by Derek Jack, MD on 05/04/2021 ? ? ?INTERVAL HISTORY:  ?[name], a 51 y.o. female, returns for routine follow-up of her colon cancer. [name] was last seen on 04/07/2021.  ? ?Today she reports feeling good. Her appetite is good, and she denies abdominal pains and n/v/d. She has lost 12 lbs since 04/22/21; she reports her baseline weight is 160 lbs. She denies current bleeding. She has a history of HTN. She has not taken hydrocodone in the past 2 weeks; she takes tylenol prn. She denies tingling/numbness. She empties her ostomy bag 2 times day, and her stools are soft. She denies history of acne. She is drinking 1-2 Boost daily.   ? ?REVIEW OF SYSTEMS:  ?Review of Systems  ?Constitutional:  Positive for unexpected weight change (-12 lbs). Negative for appetite change and fatigue.  ?HENT:   Negative for nosebleeds.   ?Respiratory:  Negative for hemoptysis.   ?Gastrointestinal:  Negative for abdominal pain, blood in stool, diarrhea, nausea and vomiting.  ?Genitourinary:  Negative for hematuria.   ?Neurological:  Negative for numbness.  ?All other systems reviewed and are negative. ? ?PAST MEDICAL/SURGICAL HISTORY:  ?Past Medical History:  ?Diagnosis Date  ? Anemia   ? Back pain   ? Hypertension   ? ?Past Surgical History:  ?Procedure Laterality Date  ? BIOPSY  03/02/2021  ? Procedure: BIOPSY;  Surgeon: Eloise Harman, DO;  [facility];  Service: Endoscopy;;  ? BOWEL RESECTION N/A 03/26/2021  ? Procedure: SMALL BOWEL RESECTION;  Surgeon: Aviva Signs, MD;  Location: [facility];  Service: General;  Laterality: N/A;  ? CHOLECYSTECTOMY    ? COLOSTOMY N/A 03/26/2021  ? Procedure: COLOSTOMY;  Surgeon: Aviva Signs, MD;  Location: [facility];  Service: General;  Laterality: N/A;  ? ESOPHAGOGASTRODUODENOSCOPY (EGD) WITH PROPOFOL N/A 03/02/2021  ? Procedure: ESOPHAGOGASTRODUODENOSCOPY (EGD) WITH PROPOFOL;  Surgeon: Eloise Harman, DO;  [facility];  Service: Endoscopy;  Laterality: N/A;  ? PARTIAL COLECTOMY N/A 03/26/2021  ? Procedure: PARTIAL COLECTOMY;  Surgeon: Aviva Signs, MD;  Location: [facility];  Service: General;  Laterality: N/A;  ? PORTACATH PLACEMENT Left 04/14/2021  ? Procedure: INSERTION PORT-A-CATH;  Surgeon: Aviva Signs, MD;  Location: [facility];  Service: General;  Laterality: Left;  ? SCLEROTHERAPY  03/02/2021  ? Procedure: [id number];  Surgeon: Eloise Harman, DO;  [facility];  Service: Endoscopy;;  ? SIGMOIDOSCOPY  03/02/2021  ? Procedure: SIGMOIDOSCOPY;  Surgeon: Eloise Harman, DO;  [facility];  Service: Endoscopy;;  ? ? ?SOCIAL HISTORY:  ?Social History  ? ?Socioeconomic History  ?  Marital status: Married  ?  Spouse name: Not on file  ? Number of children: Not on file  ? Years of education: Not on file  ? Highest education level: Not on file  ?Occupational History  ? Not on file  ?Tobacco Use  ? Smoking status: Never  ? Smokeless tobacco: Never  ?Vaping Use  ? Vaping Use: Never used  ?Substance and Sexual Activity  ? Alcohol use: No  ? Drug use: No  ? Sexual activity: Not on file  ?Other Topics Concern  ? Not on file  ?Social History Narrative  ? Not on file  ? ?Social Determinants of Health  ? ?Financial Resource Strain: Not on file  ?Food Insecurity: Not on file  ?Transportation Needs: Not on file  ?Physical Activity: Not on file  ?Stress: Not on file  ?Social Connections: Not on file  ?Intimate Partner Violence: Not on file  ? ? ?FAMILY HISTORY:  ?Family History  ?Problem Relation Age of Onset  ? Hypertension Mother   ? Hypertension Father   ? Colon cancer Neg Hx   ? Colon polyps Neg Hx   ? Inflammatory bowel disease Neg Hx   ? ? ?CURRENT MEDICATIONS:  ?Current Outpatient Medications  ?Medication Sig Dispense Refill  ? amoxicillin-clavulanate (AUGMENTIN) 875-125 MG tablet Take 1 tablet by mouth every 12 (twelve) hours. 20 tablet 0  ? ferrous sulfate 325 (65 FE) MG tablet TAKE 325 MG BY MOUTH DAILY. (Patient taking differently: 325 mg daily with breakfast.) 90 tablet 1  ? fexofenadine (ALLEGRA) 180 MG tablet Take 1 tablet (180 mg total) by mouth daily. 30 tablet 0  ? fluticasone (FLONASE) 50 MCG/ACT nasal spray Place 2 sprays into both nostrils daily. 16 g 12  ? HYDROcodone-acetaminophen (NORCO) 7.5-325 MG tablet Take 1 tablet by mouth every 6 (six) hours as needed for moderate pain. 28 tablet 0  ? medroxyPROGESTERone (DEPO-PROVERA) 150 MG/ML injection Inject 150 mg into the muscle every 3 (three) months.    ? medroxyPROGESTERone (PROVERA) 5 MG tablet Take 5 mg by mouth every other day.    ? Multiple Vitamin (MULTIVITAMIN) tablet Take 1 tablet by mouth daily.    ? nebivolol (BYSTOLIC) 10 MG  tablet Take 1 tablet (10 mg total) by mouth daily. 30 tablet 1  ? ondansetron (ZOFRAN) 4 MG tablet Take 1 tablet (4 mg total) by mouth every 8 (eight) hours as needed for nausea or vomiting. 20 tablet 0  ? pantoprazole (PROTONIX) 40 MG tablet Take 40 mg by mouth daily as needed (acid reflux).    ? ?No current facility-administered medications for this visit.  ? ? ?ALLERGIES:  ?Allergies  ?Allergen Reactions  ? Azithromycin Hives  ?  infusing arm began to swell , her face got red, and she broke out, there are bumps around where the IV site was infusing  ? Motrin [Ibuprofen] Itching  ? Lisinopril   ?  cough  ? Sudafed [Pseudoephedrine Hcl] Other (See Comments)  ?  Hypertension  ? ? ?PHYSICAL EXAM:  ?Performance status (ECOG): 1 - Symptomatic but completely ambulatory ? ?Vitals:  ? 05/04/21 1450  ?BP: 118/76  ?Pulse: 60  ?Resp: 18  ?  Temp: 98.4 ?F (36.9 ?C)  ?SpO2: 96%  ? ?Wt Readings from Last 3 Encounters:  ?05/04/21 132 lb 1.6 oz (59.9 kg)  ?04/28/21 155 lb (70.3 kg)  ?04/22/21 144 lb (65.3 kg)  ? ?Physical Exam ?Vitals reviewed.  ?Constitutional:   ?   Appearance: Normal appearance.  ?Cardiovascular:  ?   Rate and Rhythm: Normal rate and regular rhythm.  ?   Pulses: Normal pulses.  ?   Heart sounds: Normal heart sounds.  ?Pulmonary:  ?   Effort: Pulmonary effort is normal.  ?   Breath sounds: Normal breath sounds.  ?Chest:  ?   Comments: Port-a-cath placed L chest wall ?Neurological:  ?   General: No focal deficit present.  ?   Mental Status: She is alert and oriented to person, place, and time.  ?Psychiatric:     ?   Mood and Affect: Mood normal.     ?   Behavior: Behavior normal.  ?  ? ?LABORATORY DATA:  ?I have reviewed the labs as listed.  ? ?  Latest Ref Rng & Units 04/28/2021  ? 12:25 PM 03/30/2021  ?  4:27 AM 03/29/2021  ?  4:49 AM  ?CBC  ?WBC 4.0 - 10.5 K/uL 11.1   8.9   8.7    ?Hemoglobin 12.0 - 15.0 g/dL 9.7   8.8   8.6    ?Hematocrit 36.0 - 46.0 % 30.7   29.2   28.2    ?Platelets 150 - 400 K/uL 567   418    361    ? ? ?  Latest Ref Rng & Units 04/28/2021  ? 12:25 PM 03/30/2021  ?  4:27 AM 03/29/2021  ?  4:49 AM  ?CMP  ?Glucose 70 - 99 mg/dL 96   98   105    ?BUN 6 - 20 mg/dL 7   5   <5    ?Creatinine 0.44 - 1.00 mg

## 2021-05-04 NOTE — Progress Notes (Signed)
[facility]  ? ?Reason for visit:  ?LLQ colostomy ?HPI:  ?Colon carcinoma with resection and colostomy ?ROS  ?Review of Systems  ?Constitutional:  Positive for fatigue.  ?Gastrointestinal:  Positive for abdominal pain and constipation.  ?All other systems reviewed and are negative. ?Vital signs:  ?BP 117/71   Pulse 84   Temp 98.4 ?F (36.9 ?C)   Resp 17   SpO2 100%  ?Exam:  ?Physical Exam ?Vitals reviewed.  ?Constitutional:   ?   Appearance: Normal appearance. She is normal weight.  ?Abdominal:  ?   Palpations: Abdomen is soft.  ?   Comments: LLQ colostomy  ?Skin: ?   General: Skin is warm and dry.  ?Neurological:  ?   Mental Status: She is alert.  ?Psychiatric:     ?   Mood and Affect: Mood normal.     ?   Behavior: Behavior normal.  ?  ?Stoma type/location:  LLQ colostomy ?Stomal assessment/size:  1 1/4"pink and producing thick brown stool ?Peristomal assessment:  intact ?Treatment options for stomal/peristomal skin: barrier ring, stoma powder and skin prep ?Output: thick brown stool ?Ostomy pouching: 2pc. 2 3/4" with barrier ring.  ?Education provided:  husband on phone during visit, I reinforced that he is doing a great job with ostomy care.  ? ?  ?Impression/dx  ?LLQ colostomy ?Discussion  ?Patient and spouse feeling more comfortable with ostomy care ?Plan  ?See back in 2 weeks ? ? ? ?Visit time: 55 minutes.  ? ?Domenic Moras FNP-BC ? ?  ?

## 2021-05-04 NOTE — Patient Instructions (Signed)
[place] at [facility] ?Discharge Instructions ? ? ?You were seen and examined today by Dr. Delton Coombes. ? ?He discussed treatment options with you today.  He discussed a regimen called FOLFIRINOX which you would receive in the clinic every 2 weeks.  ? ?Return as scheduled next week for treatment.  ? ? ?Thank you for choosing [place] at [facility] to provide your oncology and hematology care.  To afford each patient quality time with our provider, please arrive at least 15 minutes before your scheduled appointment time.  ? ?If you have a lab appointment with the [facility] please come in thru the Main Entrance and check in at the main information desk. ? ?You need to re-schedule your appointment should you arrive 10 or more minutes late.  We strive to give you quality time with our providers, and arriving late affects you and other patients whose appointments are after yours.  Also, if you no show three or more times for appointments you may be dismissed from the clinic at the providers discretion.     ?Again, thank you for choosing [facility].  Our hope is that these requests will decrease the amount of time that you wait before being seen by our physicians.       ?_____________________________________________________________ ? ?Should you have questions after your visit to [facility], please contact our office at [phone] and follow the prompts.  Our office hours are 8:00 a.m. and 4:30 p.m. Monday - Friday.  Please note that voicemails left after 4:00 p.m. may not be returned until the following business day.  We are closed weekends and major holidays.  You do have access to a nurse 24-7, just call the main number to the clinic [phone] and do not press any options, hold on the line and a nurse will answer the phone.   ? ?For prescription refill requests, have your pharmacy contact our office and allow 72 hours.   ? ?Due  to Covid, you will need to wear a mask upon entering the hospital. If you do not have a mask, a mask will be given to you at the Main Entrance upon arrival. For doctor visits, patients may have 1 support person age 43 or older with them. For treatment visits, patients can not have anyone with them due to social distancing guidelines and our immunocompromised population.  ? ?   ?

## 2021-05-06 NOTE — Discharge Instructions (Signed)
See back in 2 weeks ?

## 2021-05-10 ENCOUNTER — Telehealth: Payer: Self-pay | Admitting: *Deleted

## 2021-05-10 ENCOUNTER — Encounter (HOSPITAL_COMMUNITY): Payer: Self-pay | Admitting: Hematology

## 2021-05-10 ENCOUNTER — Encounter (HOSPITAL_COMMUNITY): Payer: Self-pay

## 2021-05-10 DIAGNOSIS — Z95828 Presence of other vascular implants and grafts: Secondary | ICD-10-CM | POA: Insufficient documentation

## 2021-05-10 HISTORY — DX: Presence of other vascular implants and grafts: Z95.828

## 2021-05-10 MED ORDER — PROCHLORPERAZINE MALEATE 10 MG PO TABS: 10.0000 mg | ORAL_TABLET | Freq: Four times a day (QID) | ORAL | 1 refills | Status: DC | PRN

## 2021-05-10 MED ORDER — LIDOCAINE-PRILOCAINE 2.5-2.5 % EX CREA: TOPICAL_CREAM | CUTANEOUS | 3 refills | Status: DC

## 2021-05-10 NOTE — Telephone Encounter (Signed)
Received call from patient.  ? ?Inquired as to when she would be able to return to work.  ? ?Advised that colectomy and ileostomy were performed in Feb 2023. Advised that since she is now dealing with abscess, it may be longer.  ? ?Advised to contact Dr. Delton Coombes to inquire as well as she is now having oncology treatments.  ? ?Please advise.  ?

## 2021-05-11 ENCOUNTER — Inpatient Hospital Stay (HOSPITAL_COMMUNITY): Payer: Medicaid Other | Attending: Hematology

## 2021-05-11 DIAGNOSIS — C187 Malignant neoplasm of sigmoid colon: Secondary | ICD-10-CM | POA: Insufficient documentation

## 2021-05-11 DIAGNOSIS — C786 Secondary malignant neoplasm of retroperitoneum and peritoneum: Secondary | ICD-10-CM | POA: Insufficient documentation

## 2021-05-11 DIAGNOSIS — Z933 Colostomy status: Secondary | ICD-10-CM | POA: Insufficient documentation

## 2021-05-11 DIAGNOSIS — D509 Iron deficiency anemia, unspecified: Secondary | ICD-10-CM | POA: Insufficient documentation

## 2021-05-11 DIAGNOSIS — C189 Malignant neoplasm of colon, unspecified: Secondary | ICD-10-CM

## 2021-05-11 DIAGNOSIS — Z95828 Presence of other vascular implants and grafts: Secondary | ICD-10-CM

## 2021-05-11 DIAGNOSIS — F32A Depression, unspecified: Secondary | ICD-10-CM | POA: Insufficient documentation

## 2021-05-11 DIAGNOSIS — R630 Anorexia: Secondary | ICD-10-CM | POA: Insufficient documentation

## 2021-05-11 DIAGNOSIS — E876 Hypokalemia: Secondary | ICD-10-CM | POA: Insufficient documentation

## 2021-05-11 DIAGNOSIS — Z79899 Other long term (current) drug therapy: Secondary | ICD-10-CM | POA: Insufficient documentation

## 2021-05-11 DIAGNOSIS — R634 Abnormal weight loss: Secondary | ICD-10-CM | POA: Insufficient documentation

## 2021-05-11 DIAGNOSIS — Z5111 Encounter for antineoplastic chemotherapy: Secondary | ICD-10-CM | POA: Insufficient documentation

## 2021-05-11 NOTE — Progress Notes (Signed)

## 2021-05-11 NOTE — Patient Instructions (Addendum)
[facility] ?Chemotherapy Teaching ? ? 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 You will be treated in the clinic every 2 weeks with a combination of drugs. Those are:  Vectibix; oxaliplatin; irinotecan; leucovorin; fluorouracil (5FU).  The intent of treatment is to control this cancer, prevent it from spreading further, and to alleviate any symptoms you may be having related to this disease.  Dr. Delton Coombes is also hopeful that with aggressive treatment, you may be a surgical candidate to have the peritoneal metastasis removed. You will see the doctor regularly throughout treatment. We will obtain blood work from you prior to every treatment and monitor your results to make sure it is safe to give your treatment. The doctor monitors your response to treatment by the way you are feeling, your blood work, and by obtaining scans periodically. There will be wait times while you are here for treatment.  It will take about 30 minutes to 1 hour for your lab work to result.  Then there will be wait times while pharmacy mixes your medications.  ? ?Medications you will receive in the clinic prior to your chemotherapy medications: ? ?Aloxi:  ALOXI is used in adults to help prevent nausea and vomiting that happens with certain chemotherapy drugs.  Aloxi is a long acting medication, and will remain in your system for about two days.  ? ?Dexamethasone:  This is a steroid given prior to chemotherapy to help prevent allergic reactions; it may also help prevent and control nausea and diarrhea.   ? ? ?Panitumumab (Vectibix?) ? ?Pronounced: PAN-i-TOOM-ue-mab ? ?Classification: Monoclonal Antibody ? ?Panitumumab (Vectibix?) ?Panitumumab is a type of medication called a monoclonal antibody. Monoclonal antibodies are created in a lab to attach to the targets found on specific types of cancer cells. The antibody ?calls? the immune system to attack the cell it is attached to, resulting in the  immune system killing the cell. These antibodies can work in different ways, including stimulating the immune system to kill the cell, blocking cell growth, or other functions necessary for cell growth. Panitumumab binds specifically to the epidermal growth factor receptor (EGFR). ? ?This medication only works in cancers with a normal RAS gene (called wild type) and your provider will test for this before starting therapy with panitumumab. ? ?How to Take Panitumumab ?Panitumumab is given as an intravenous (IV, into a vein) infusion. It will take 1 hour to infuse.  ? ?Possible Side Effects of Panitumumab ?There are a number of things you can do to manage the side effects of panitumumab. Talk to your care team about these recommendations. They can help you decide what will work best for you. These are some of the most common or important side effects: ? ?Nail and Skin Changes ?Panitumumab has some unique nail and skin side effects that you may develop. Patients may develop a rash. This rash can be serious. Be sure to notify your provider of any skin changes you develop, as these can become severe. While this rash may look like acne, it is not, and should not be treated with acne medications. The rash may appear red, swollen, crusty, dry, and feel sore. You may also develop very dry skin, which may crack, be itchy or become flaky or scaly. The rash typically starts in the first week of treatment but can occur at any time during treatment. Tips for managing your skin include: ? ?Use a thick, alcohol-free emollient lotion or cream on your skin at least twice  a day, including right after bathing. ? ?Avoid sun exposure, as it can worsen the rash or cause a severe burn. Use sunscreen with an SPF of 30 or higher and wear a hat and sunglasses to protect your head and face from the sun. ? ?Bathe/shower in cool or lukewarm (not hot) water and pat your skin dry. ? ?Use soaps, lotions, and laundry detergents without alcohol,  perfumes, or dyes. ? ?Wear gloves to wash dishes or do housework or gardening. ? ?Drink plenty of water and try not to scratch or rub your skin. ? ?Notify your healthcare team if you develop a rash, as they can provide suggestions to manage the rash and/or prescribe a topical medication to apply to the rash or an oral medication. ? ?If you develop peeling or blistering of the skin, notify your healthcare team right away. ? ?While receiving panitumumab, you may develop an inflammation of the skin around the nail bed/cuticle areas of toes or fingers, which is called paronychia. It can appear red, swollen, or pus-filled. Nails may develop "ridges" in them or fall off. You may also develop cuts or cracks that look like small paper cuts in the skin on your toes, fingers, or knuckles. These side effects may appear several months after starting treatment but can last for many months after treatment stops. ? ?Follow the same recommendations for your skin (above). ? ?Don't bite your nails or cuticles or cut the cuticles. ? ?Keep your fingernails and toenails clean and dry. ? ?You may use nail polish, but do not wear fake nails (gels, acrylics, overlay). ? ?Notify your healthcare provider if any nails fall off or you develop any of these side effects or other skin abnormalities. ? ?Fatigue ?Fatigue is very common during cancer treatment and is an overwhelming feeling of exhaustion that is not usually relieved by rest. While on cancer treatment, and for a period after, you may need to adjust your schedule to manage fatigue. Plan times to rest during the day and conserve energy for more important activities. Exercise can help combat fatigue; a simple daily walk with a friend can help.  Talk to your healthcare team for helpful tips on dealing with this side effect.  ? ?Nausea and/or Vomiting ?Talk to your oncology care team so they can prescribe medications to help you manage nausea and vomiting. In addition, dietary changes may  help. Avoid things that may worsen the symptoms, such as heavy or greasy/fatty, spicy or acidic foods (lemons, tomatoes, oranges). Try saltines, or ginger ale to lessen symptoms. ? ?Call your oncology care team if you are unable to keep fluids down for more than 12 hours or if you feel lightheaded or dizzy at any time. ? ?Diarrhea ?Your oncology care team can recommend medications to relieve diarrhea. Also, try eating low-fiber, bland foods, such as white rice and boiled or baked chicken. Avoid raw fruits, vegetables, whole-grain breads, cereals, and seeds. Soluble fiber is found in some foods and absorbs fluid, which can help relieve diarrhea. Foods high in soluble fiber include applesauce, bananas (ripe), canned fruit, orange sections, boiled potatoes, white rice, products made with white flour, oatmeal, cream of rice, cream of wheat, and farina. Drink 8-10 glasses of non-alcoholic, un-caffeinated fluid a day to prevent dehydration. ? ?Less common, but important side effects can include: ? ?Eye Concerns: While receiving panitumumab, some patients may develop irritation or damage to the cornea (clear part covers the eyeball) or changes in their eyesight. Notify your healthcare team if you  develop any eye pain, swelling, redness, or any vision changes, including blurriness and sensitivity to light. ?Infusion-Related Side Effects: The infusion can cause a reaction that may lead to chills, fever, or difficulty breathing. Reactions are most common during the first week of therapy, including the evening after the infusion. Your care team will tell you what to do if this happens. ? ?Lung Problems: Patients can develop inflammation or scarring of the lungs (called pneumonitis or fibrosis), or interstitial lung disease while taking this medication. Notify your healthcare provider right away if you develop any new or worsening symptoms, including shortness of breath, trouble breathing, cough, or fever. ? ?Electrolyte  Abnormalities: This medication can affect the normal levels of electrolytes (potassium, magnesium, calcium, etc.) in your body. Your levels will be monitored using blood tests. If your levels become too low, you

## 2021-05-17 ENCOUNTER — Inpatient Hospital Stay (HOSPITAL_COMMUNITY): Payer: Medicaid Other

## 2021-05-17 ENCOUNTER — Other Ambulatory Visit (HOSPITAL_COMMUNITY): Payer: Medicaid Other

## 2021-05-17 ENCOUNTER — Ambulatory Visit (HOSPITAL_COMMUNITY): Payer: Medicaid Other | Admitting: Hematology

## 2021-05-17 VITALS — BP 154/75 | HR 69 | Temp 98.8°F | Resp 18

## 2021-05-17 DIAGNOSIS — R630 Anorexia: Secondary | ICD-10-CM | POA: Diagnosis not present

## 2021-05-17 DIAGNOSIS — Z933 Colostomy status: Secondary | ICD-10-CM | POA: Diagnosis not present

## 2021-05-17 DIAGNOSIS — Z5111 Encounter for antineoplastic chemotherapy: Secondary | ICD-10-CM | POA: Diagnosis not present

## 2021-05-17 DIAGNOSIS — C189 Malignant neoplasm of colon, unspecified: Secondary | ICD-10-CM

## 2021-05-17 DIAGNOSIS — E876 Hypokalemia: Secondary | ICD-10-CM | POA: Diagnosis not present

## 2021-05-17 DIAGNOSIS — Z79899 Other long term (current) drug therapy: Secondary | ICD-10-CM | POA: Diagnosis not present

## 2021-05-17 DIAGNOSIS — R634 Abnormal weight loss: Secondary | ICD-10-CM | POA: Diagnosis not present

## 2021-05-17 DIAGNOSIS — C786 Secondary malignant neoplasm of retroperitoneum and peritoneum: Secondary | ICD-10-CM | POA: Diagnosis not present

## 2021-05-17 DIAGNOSIS — Z95828 Presence of other vascular implants and grafts: Secondary | ICD-10-CM

## 2021-05-17 DIAGNOSIS — D509 Iron deficiency anemia, unspecified: Secondary | ICD-10-CM | POA: Diagnosis not present

## 2021-05-17 DIAGNOSIS — F32A Depression, unspecified: Secondary | ICD-10-CM | POA: Diagnosis not present

## 2021-05-17 DIAGNOSIS — C187 Malignant neoplasm of sigmoid colon: Secondary | ICD-10-CM | POA: Diagnosis present

## 2021-05-17 LAB — CBC WITH DIFFERENTIAL/PLATELET
Abs Immature Granulocytes: 0.01 10*3/uL (ref 0.00–0.07)
Basophils Absolute: 0 10*3/uL (ref 0.0–0.1)
Basophils Relative: 0 %
Eosinophils Absolute: 0.2 10*3/uL (ref 0.0–0.5)
Eosinophils Relative: 3 %
HCT: 31.6 % — ABNORMAL LOW (ref 36.0–46.0)
Hemoglobin: 9.9 g/dL — ABNORMAL LOW (ref 12.0–15.0)
Immature Granulocytes: 0 %
Lymphocytes Relative: 38 %
Lymphs Abs: 2.3 10*3/uL (ref 0.7–4.0)
MCH: 25.3 pg — ABNORMAL LOW (ref 26.0–34.0)
MCHC: 31.3 g/dL (ref 30.0–36.0)
MCV: 80.6 fL (ref 80.0–100.0)
Monocytes Absolute: 0.4 10*3/uL (ref 0.1–1.0)
Monocytes Relative: 6 %
Neutro Abs: 3.1 10*3/uL (ref 1.7–7.7)
Neutrophils Relative %: 53 %
Platelets: 335 10*3/uL (ref 150–400)
RBC: 3.92 MIL/uL (ref 3.87–5.11)
RDW: 19.4 % — ABNORMAL HIGH (ref 11.5–15.5)
WBC: 5.9 10*3/uL (ref 4.0–10.5)
nRBC: 0 % (ref 0.0–0.2)

## 2021-05-17 LAB — COMPREHENSIVE METABOLIC PANEL
ALT: 15 U/L (ref 0–44)
AST: 15 U/L (ref 15–41)
Albumin: 3.2 g/dL — ABNORMAL LOW (ref 3.5–5.0)
Alkaline Phosphatase: 69 U/L (ref 38–126)
Anion gap: 7 (ref 5–15)
BUN: 6 mg/dL (ref 6–20)
CO2: 23 mmol/L (ref 22–32)
Calcium: 8.6 mg/dL — ABNORMAL LOW (ref 8.9–10.3)
Chloride: 109 mmol/L (ref 98–111)
Creatinine, Ser: 0.55 mg/dL (ref 0.44–1.00)
GFR, Estimated: >60 mL/min (ref >60)
Glucose, Bld: 95 mg/dL (ref 70–99)
Potassium: 3.1 mmol/L — ABNORMAL LOW (ref 3.5–5.1)
Sodium: 139 mmol/L (ref 135–145)
Total Bilirubin: 0.4 mg/dL (ref 0.3–1.2)
Total Protein: 7 g/dL (ref 6.5–8.1)

## 2021-05-17 LAB — MAGNESIUM: Magnesium: 1.9 mg/dL (ref 1.7–2.4)

## 2021-05-17 MED ORDER — ATROPINE SULFATE 1 MG/ML IV SOLN
0.5000 mg | Freq: Once | INTRAVENOUS | Status: AC
  Administered 2021-05-17: 0.5 mg via INTRAVENOUS
  Filled 2021-05-17: qty 1

## 2021-05-17 MED ORDER — SODIUM CHLORIDE 0.9% FLUSH
10.0000 mL | INTRAVENOUS | Status: DC | PRN
  Administered 2021-05-17: 10 mL

## 2021-05-17 MED ORDER — OXALIPLATIN CHEMO INJECTION 100 MG/20ML
89.0000 mg/m2 | Freq: Once | INTRAVENOUS | Status: AC
  Administered 2021-05-17: 150 mg via INTRAVENOUS
  Filled 2021-05-17: qty 20

## 2021-05-17 MED ORDER — SODIUM CHLORIDE 0.9 % IV SOLN: Freq: Once | INTRAVENOUS | Status: AC

## 2021-05-17 MED ORDER — HEPARIN SOD (PORK) LOCK FLUSH 100 UNIT/ML IV SOLN: 500.0000 [IU] | Freq: Once | INTRAVENOUS | Status: DC | PRN

## 2021-05-17 MED ORDER — SODIUM CHLORIDE 0.9 % IV SOLN
400.0000 mg | Freq: Once | INTRAVENOUS | Status: AC
  Administered 2021-05-17: 400 mg via INTRAVENOUS
  Filled 2021-05-17: qty 20

## 2021-05-17 MED ORDER — SODIUM CHLORIDE 0.9 % IV SOLN
10.0000 mg | Freq: Once | INTRAVENOUS | Status: AC
  Administered 2021-05-17: 10 mg via INTRAVENOUS
  Filled 2021-05-17: qty 10

## 2021-05-17 MED ORDER — DEXTROSE 5 % IV SOLN: Freq: Once | INTRAVENOUS | Status: AC

## 2021-05-17 MED ORDER — PALONOSETRON HCL INJECTION 0.25 MG/5ML
0.2500 mg | Freq: Once | INTRAVENOUS | Status: AC
  Administered 2021-05-17: 0.25 mg via INTRAVENOUS
  Filled 2021-05-17: qty 5

## 2021-05-17 MED ORDER — LEUCOVORIN CALCIUM INJECTION 350 MG
200.0000 mg/m2 | Freq: Once | INTRAVENOUS | Status: AC
  Administered 2021-05-17: 332 mg via INTRAVENOUS
  Filled 2021-05-17: qty 16.6

## 2021-05-17 MED ORDER — SODIUM CHLORIDE 0.9 % IV SOLN
2400.0000 mg/m2 | INTRAVENOUS | Status: DC
  Administered 2021-05-17: 4000 mg via INTRAVENOUS
  Filled 2021-05-17: qty 80

## 2021-05-17 MED ORDER — SODIUM CHLORIDE 0.9 % IV SOLN
165.0000 mg/m2 | Freq: Once | INTRAVENOUS | Status: AC
  Administered 2021-05-17: 280 mg via INTRAVENOUS
  Filled 2021-05-17: qty 10

## 2021-05-17 MED ORDER — SODIUM CHLORIDE 0.9 % IV SOLN
150.0000 mg | Freq: Once | INTRAVENOUS | Status: AC
  Administered 2021-05-17: 150 mg via INTRAVENOUS
  Filled 2021-05-17: qty 5

## 2021-05-17 NOTE — Progress Notes (Signed)
Patient presents today for D1,C1 Vectibix, Folfirinox with 5FU pump start.  Vital signs and labs within parameters for treatment.  Patient has no new complaints at this time. ? ?Folfirinox and Vectibix given today per MD orders.  Stable during infusion without adverse affects.  Vital signs stable.  Pump start and verified RUN on the screen with patient.  No complaints at this time.  Discharge from clinic ambulatory in stable condition.  Alert and oriented X 3.  Follow up with [facility] as scheduled.  ?

## 2021-05-17 NOTE — Patient Instructions (Signed)
Diana Fox  Discharge Instructions: ?Thank you for choosing [facility] to provide your oncology and hematology care.  ?If you have a lab appointment with the [facility], please come in thru the Main Entrance and check in at the main information desk. ? ?Wear comfortable clothing and clothing appropriate for easy access to any Portacath or PICC line.  ? ?We strive to give you quality time with your provider. You may need to reschedule your appointment if you arrive late (15 or more minutes).  Arriving late affects you and other patients whose appointments are after yours.  Also, if you miss three or more appointments without notifying the office, you may be dismissed from the clinic at the provider?s discretion.    ?  ?For prescription refill requests, have your pharmacy contact our office and allow 72 hours for refills to be completed.   ? ?Today you received the following chemotherapy and/or immunotherapy agents Folfirinox and vectibix    ?  ?To help prevent nausea and vomiting after your treatment, we encourage you to take your nausea medication as directed. ? ?BELOW ARE SYMPTOMS THAT SHOULD BE REPORTED IMMEDIATELY: ?*FEVER GREATER THAN 100.4 F (38 ?C) OR HIGHER ?*CHILLS OR SWEATING ?*NAUSEA AND VOMITING THAT IS NOT CONTROLLED WITH YOUR NAUSEA MEDICATION ?*UNUSUAL SHORTNESS OF BREATH ?*UNUSUAL BRUISING OR BLEEDING ?*URINARY PROBLEMS (pain or burning when urinating, or frequent urination) ?*BOWEL PROBLEMS (unusual diarrhea, constipation, pain near the anus) ?TENDERNESS IN MOUTH AND THROAT WITH OR WITHOUT PRESENCE OF ULCERS (sore throat, sores in mouth, or a toothache) ?UNUSUAL RASH, SWELLING OR PAIN  ?UNUSUAL VAGINAL DISCHARGE OR ITCHING  ? ?Items with * indicate a potential emergency and should be followed up as soon as possible or go to the Emergency Department if any problems should occur. ? ?Please show the CHEMOTHERAPY ALERT CARD or IMMUNOTHERAPY ALERT CARD at check-in to the  Emergency Department and triage nurse. ? ?Should you have questions after your visit or need to cancel or reschedule your appointment, please contact [facility] [phone]  and follow the prompts.  Office hours are 8:00 a.m. to 4:30 p.m. Monday - Friday. Please note that voicemails left after 4:00 p.m. may not be returned until the following business day.  We are closed weekends and major holidays. You have access to a nurse at all times for urgent questions. Please call the main number to the clinic [phone] and follow the prompts. ? ?For any non-urgent questions, you may also contact your provider using MyChart. We now offer e-Visits for anyone 6 and older to request care online for non-urgent symptoms. For details visit [email or web address]. ?  ?Also download the MyChart app! Go to the app store, search "MyChart", open the app, select [facility], and log in with your MyChart username and password. ? ?Due to Covid, a mask is required upon entering the hospital/clinic. If you do not have a mask, one will be given to you upon arrival. For doctor visits, patients may have 1 support person aged 63 or older with them. For treatment visits, patients cannot have anyone with them due to current Covid guidelines and our immunocompromised population.  ?

## 2021-05-17 NOTE — Progress Notes (Signed)
Pharmacist Chemotherapy Monitoring - Initial Assessment   ? ?Anticipated start date: 05/17/21  ? ?The following has been reviewed per standard work regarding the patient's treatment regimen: ?The patient's diagnosis, treatment plan and drug doses, and organ/hematologic function ?Lab orders and baseline tests specific to treatment regimen  ?The treatment plan start date, drug sequencing, and pre-medications ?Prior authorization status  ?Patient's documented medication list, including drug-drug interaction screen and prescriptions for anti-emetics and supportive care specific to the treatment regimen ?The drug concentrations, fluid compatibility, administration routes, and timing of the medications to be used ?The patient's access for treatment and lifetime cumulative dose history, if applicable  ?The patient's medication allergies and previous infusion related reactions, if applicable  ? ?Changes made to treatment plan:  ?N/A ? ?Follow up needed:  ?N/A ? ? ?Wynona Neat, [facility], ?05/17/2021  9:41 AM  ?

## 2021-05-17 NOTE — Progress Notes (Signed)
Patients port flushed without difficulty.  Good blood return noted with no bruising or swelling noted at site.  Stable during access and blood draw.  Patient to remain accessed for treatment. 

## 2021-05-18 ENCOUNTER — Ambulatory Visit (HOSPITAL_COMMUNITY)
Admission: RE | Admit: 2021-05-18 | Discharge: 2021-05-18 | Disposition: A | Discharge status: Home / Self Care | Payer: Medicaid Other | Source: Ambulatory Visit | Attending: Nurse Practitioner | Admitting: Nurse Practitioner

## 2021-05-18 DIAGNOSIS — L24B1 Irritant contact dermatitis related to digestive stoma or fistula: Secondary | ICD-10-CM | POA: Diagnosis not present

## 2021-05-18 DIAGNOSIS — Z433 Encounter for attention to colostomy: Secondary | ICD-10-CM | POA: Insufficient documentation

## 2021-05-18 DIAGNOSIS — C189 Malignant neoplasm of colon, unspecified: Secondary | ICD-10-CM | POA: Diagnosis not present

## 2021-05-18 DIAGNOSIS — K5900 Constipation, unspecified: Secondary | ICD-10-CM | POA: Diagnosis not present

## 2021-05-18 DIAGNOSIS — Z933 Colostomy status: Secondary | ICD-10-CM | POA: Diagnosis present

## 2021-05-18 NOTE — Discharge Instructions (Signed)
I will enroll with [facility] ?1 pc convex presized  ?Add belt ?

## 2021-05-18 NOTE — Progress Notes (Signed)
[facility]  ? ?Reason for visit:  ?LLQ colostomy ?HPI:  ?Carcinoma colon with resection  ?ROS  ?Review of Systems  ?Gastrointestinal:   ?     Was having tenderness to peristomal area.  During infusion yesterday, began having loose stools and states that the tenderness is much better.  I feel that she is on and off again constipated. Encouraged colace twice daily.   ?Skin:   ?     Protecting peristomal skin with skin prep  ?All other systems reviewed and are negative. ?Vital signs:  ?BP 138/80   Pulse 72   Temp 98.7 ?F (37.1 ?C)   Resp 17   SpO2 99%  ?Exam:  ?Physical Exam ?Abdominal:  ?   Palpations: Abdomen is soft.  ?Skin: ?   General: Skin is warm and dry.  ?Neurological:  ?   Mental Status: She is alert and oriented to person, place, and time.  ?Psychiatric:     ?   Mood and Affect: Mood normal.  ?  ?Stoma type/location:  LLQ colostomy ?Stomal assessment/size:  1 3/8" pink and producing thick brown stool ?Peristomal assessment:  intact  ?Treatment options for stomal/peristomal skin: has been trying a coloplast pouch and likes that better  we will switch to a 1 piece convex pouch with filter and no barrier ring.  ?Output: thick brown stool ?Ostomy pouching: 1 pc convex ?Education provided:  demonstrated the coloplast pouch.  I have presized pouches in her size and she likes that. Her spouse is available by phone and he likes that as well. We will also add a belt today ? ?  ?Impression/dx  ?colostomy ?Discussion  ?Switch pouches ?Plan  ?I will enroll with [employer].  ? ? ? ?Visit time: 50 minutes.  ? ?Domenic Moras FNP-BC ? ?  ?

## 2021-05-19 ENCOUNTER — Inpatient Hospital Stay (HOSPITAL_COMMUNITY): Payer: Medicaid Other

## 2021-05-19 ENCOUNTER — Inpatient Hospital Stay (HOSPITAL_COMMUNITY): Payer: Medicaid Other | Admitting: Licensed Clinical Social Worker

## 2021-05-19 VITALS — BP 148/92 | HR 57 | Temp 97.8°F | Resp 18

## 2021-05-19 DIAGNOSIS — Z95828 Presence of other vascular implants and grafts: Secondary | ICD-10-CM

## 2021-05-19 DIAGNOSIS — C189 Malignant neoplasm of colon, unspecified: Secondary | ICD-10-CM

## 2021-05-19 DIAGNOSIS — Z5111 Encounter for antineoplastic chemotherapy: Secondary | ICD-10-CM | POA: Diagnosis not present

## 2021-05-19 MED ORDER — HEPARIN SOD (PORK) LOCK FLUSH 100 UNIT/ML IV SOLN
500.0000 [IU] | Freq: Once | INTRAVENOUS | Status: AC | PRN
  Administered 2021-05-19: 500 [IU]

## 2021-05-19 MED ORDER — SODIUM CHLORIDE 0.9% FLUSH
10.0000 mL | INTRAVENOUS | Status: DC | PRN
  Administered 2021-05-19: 10 mL

## 2021-05-19 NOTE — Progress Notes (Signed)
Patient presents today for 5FU home infusion pump d/c.  Patient is in satisfactory condition with no complaints voiced.  Vital signs are stable.   ? ?5FU pump disconnected with no complaints.  Patient left ambulatory with vital signs stable.  ?

## 2021-05-19 NOTE — Patient Instructions (Signed)
[facility]  Discharge Instructions: Thank you for choosing [facility] to provide your oncology and hematology care.  If you have a lab appointment with the Cancer Center, please come in thru the Main Entrance and check in at the main information desk.  Wear comfortable clothing and clothing appropriate for easy access to any Portacath or PICC line.   We strive to give you quality time with your provider. You may need to reschedule your appointment if you arrive late (15 or more minutes).  Arriving late affects you and other patients whose appointments are after yours.  Also, if you miss three or more appointments without notifying the office, you may be dismissed from the clinic at the provider's discretion.      For prescription refill requests, have your pharmacy contact our office and allow 72 hours for refills to be completed.        To help prevent nausea and vomiting after your treatment, we encourage you to take your nausea medication as directed.  BELOW ARE SYMPTOMS THAT SHOULD BE REPORTED IMMEDIATELY: *FEVER GREATER THAN 100.4 F (38 C) OR HIGHER *CHILLS OR SWEATING *NAUSEA AND VOMITING THAT IS NOT CONTROLLED WITH YOUR NAUSEA MEDICATION *UNUSUAL SHORTNESS OF BREATH *UNUSUAL BRUISING OR BLEEDING *URINARY PROBLEMS (pain or burning when urinating, or frequent urination) *BOWEL PROBLEMS (unusual diarrhea, constipation, pain near the anus) TENDERNESS IN MOUTH AND THROAT WITH OR WITHOUT PRESENCE OF ULCERS (sore throat, sores in mouth, or a toothache) UNUSUAL RASH, SWELLING OR PAIN  UNUSUAL VAGINAL DISCHARGE OR ITCHING   Items with * indicate a potential emergency and should be followed up as soon as possible or go to the Emergency Department if any problems should occur.  Please show the CHEMOTHERAPY ALERT CARD or IMMUNOTHERAPY ALERT CARD at check-in to the Emergency Department and triage nurse.  Should you have questions after your visit or need to cancel  or reschedule your appointment, please contact [facility] [phone]  and follow the prompts.  Office hours are 8:00 a.m. to 4:30 p.m. Monday - Friday. Please note that voicemails left after 4:00 p.m. may not be returned until the following business day.  We are closed weekends and major holidays. You have access to a nurse at all times for urgent questions. Please call the main number to the clinic [phone] and follow the prompts.  For any non-urgent questions, you may also contact your provider using MyChart. We now offer e-Visits for anyone 18 and older to request care online for non-urgent symptoms. For details visit [email or web address].   Also download the MyChart app! Go to the app store, search "MyChart", open the app, select [facility], and log in with your MyChart username and password.  Due to Covid, a mask is required upon entering the hospital/clinic. If you do not have a mask, one will be given to you upon arrival. For doctor visits, patients may have 1 support person aged 18 or older with them. For treatment visits, patients cannot have anyone with them due to current Covid guidelines and our immunocompromised population.  

## 2021-05-19 NOTE — Progress Notes (Signed)
Referred to [facility] ?

## 2021-05-19 NOTE — Progress Notes (Signed)
[name]  ?Initial Assessment ? ? ?Diana Fox is a 51 y.o. year old female presenting alone. Clinical Social Work was referred by nurse navigator for assessment of psychosocial needs.  ? ?SDOH (Social Determinants of Health) assessments performed: Yes ?  ?Distress Screen completed: No ?   ? View : No data to display.  ?  ?  ?  ? ? ? ? ?Family/Social Information:  ?Housing Arrangement: patient lives with her spouse Diana Fox),  and the couple has 3 adult daughters residing close by ?Family members/support persons in your life? Family ?Transportation concerns: no  ?Employment: Out of work due to treatment. Income source: Supported by [employer] ?Financial concerns: Yes, current concerns ?Type of concern: Rent/ mortgage ?Food access concerns: no ?Religious or spiritual practice: not identified ?Services Currently in place:  none ? ?Coping/ Adjustment to diagnosis: ?Patient understands treatment plan and what happens next? yes ?Concerns about diagnosis and/or treatment:  Pt concerned about the success of treatment as well as having the colostomy removed and returning to a more "normal" life ?Patient reported stressors: Finances ?Hopes and priorities: Priority is to complete treatment with positive results ?Patient enjoys time with family/ friends ?Current coping skills/ strengths: Supportive family/friends  ? ? ? SUMMARY: ?Current SDOH Barriers:  ?Financial constraints related to being out of work ? ?Clinical Social Work Clinical Goal(s):  ?patient will work with [employer] to address needs related to financial concerns ? ?Interventions: ?Discussed common feeling and emotions when being diagnosed with cancer, and the importance of support during treatment ?Informed patient of the support team roles and support services at [facility] ?Provided CSW contact information and encouraged patient to call with any questions or concerns ?Referred patient to [facility] ? ? ?Follow Up  Plan: Patient will contact CSW with any support or resource needs ?Patient verbalizes understanding of plan: Yes ? ? ? ?Henriette Combs, LCSW ?

## 2021-05-24 NOTE — Progress Notes (Signed)
[facility] ?[address] ?[place], [place] [address] ?Phone: [phone] ?Fax: [phone] ? ?SYMPTOMS MANAGEMENT CLINIC PROGRESS NOTE  ? ?Diana Fox ?[id number] ?Mar 29, 1970 ?51 y.o. ? ?Diana Fox is managed by Dr. 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 ? ?Actively treated with chemotherapy/immunotherapy/hormonal therapy: YES ? ?Current therapy: FOLFOXIRI and panitumumab every 14 days ? ?Last treated: 05/17/2021  ? ?Next scheduled appointment with provider: 06/01/2021 ? ?Subjective:  ?Chief Complaint: Chemotherapy follow-up ? ?Diana Fox is managed by Dr. 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  She received her first cycle of FOLFOXIRI and panitumumab on 05/17/2021. ? ?She reports that she tolerated her chemotherapy infusions fairly well.  She reports that she has had good energy and denies any fatigue. ?She had some loose bowel movements in her colostomy bag over the weekend, but reports that this is better now.  She denies any nausea or vomiting.  She has not had any mouth sores. ? ?Her chief concern at this time is her ongoing weight loss and poor appetite.  She has had some ongoing weight loss since the time of her diagnosis, but reports that she has had especially poor appetite over the past week.  She is uncertain if this is a side effect from chemotherapy or related to emotional distress, as she reports that her husband left her over this past weekend.  She has been "nibbling here and there," eating less than 25% of her usual oral intake.  Her weight today is 126 pounds, which is down 13 pounds over the past week.  She has not been drinking Boost or Ensure as recommended.  She is drinking 16 to 24 ounces of water daily. ? ?She denies any changes in urine output, rash, skin changes, neuropathy, or cold sensitivity.  She has not had any peripheral edema, chest pain, shortness of  breath, or dyspnea on exertion. ?She denies any pain.  She has not noticed any abnormal bruising or bleeding.  She denies any fever, chills, or night sweats. ? ?She  has 100% energy and 50% appetite.   ? ? ?Review of Systems:  ?Review of Systems  ?Constitutional:  Positive for appetite change and unexpected weight change. Negative for activity change, chills, diaphoresis, fatigue and fever.  ?HENT:  Negative for mouth sores, nosebleeds, sore throat and trouble swallowing.   ?Respiratory:  Negative for cough and shortness of breath.   ?Cardiovascular:  Negative for chest pain, palpitations and leg swelling.  ?Gastrointestinal:  Positive for diarrhea (resolved). Negative for abdominal pain, blood in stool, constipation, nausea and vomiting.  ?Genitourinary:  Negative for dysuria and hematuria.  ?Neurological:  Negative for dizziness, light-headedness, numbness and headaches.  ?Psychiatric/Behavioral:  Positive for dysphoric mood and sleep disturbance. The patient is nervous/anxious.   ? ? ?Past Medical History, Surgical history, Social history, and Family history were reviewed as documented elsewhere in chart, and were updated as appropriate.  ? ?Assessment & Plan:    ?1.  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 Delton Coombes ?- She received cycle 1 of FOLFOXIRI and panitumumab on 05/17/2021 ?- CBC (05/25/2021) significant for Hgb 10.8/MCV 79.7, platelets 266, WBC 3.6, ANC 1.6 ?- Normal magnesium.  CMP significant for hypokalemia with potassium 2.9 (addressed below) ?- PLAN: She is scheduled for repeat labs and MD visit with Dr. Delton Coombes on 06/01/2021 with plans to start cycle #2 of chemotherapy at that time ? ?2.  Back  and abdominal pain ?- She denies any pain at today's visit ?- She has not required any recent narcotic pain medication ? ?3.  Weight loss and poor appetite ?- Ongoing weight loss since the time of her diagnosis ?- Especially poor appetite over the  past week, uncertain if this is a side effect from chemotherapy or related to emotional distress, as she reports that her husband left her over this past weekend ?- Eating less than 25% of her usual oral intake. ?- Her weight today is 126 pounds, which is down 13 pounds over the past week. ?- She has not been drinking Boost or Ensure as recommended. ?- She is drinking 16 to 24 ounces of water daily. ?- PLAN: She was referred to dietitian, is scheduled for in person dietary visit on Thursday, 05/27/2021 ?- Prescription sent for Megace suspension twice daily ?- Instructed to drink 3-4 Boost or Ensure beverages daily ?- We will continue to follow her weight closely at next visit ?- Instructed to drink 64 ounces of water daily ? ?4.  Microcytic anemia ?- CBC today (05/25/2021) with Hgb 10.8/MCV 79.7 ?- PLAN: We will check ferritin and iron panel at follow-up visit next week.  Continue taking iron tablet daily.  We will consider parenteral IV iron therapy if found to be severely depleted at next visit. ? ?5.  Depression ?- Patient exhibits symptoms of emotional distress during her visit today, related to her cancer diagnosis as well as her recent separation from her husband ?- She denies any thoughts of self-harm, suicidal ideation, or suicidal intent ?-- PHQ 9 questionnaire score was 4-6 (patient responded affirmatively to poor appetite, but unclear if this is medication side effect or symptom of depression) - Scores 5-9 suggest mild depression which may require only watchful waiting and repeated PHQ-9 at followup. ?- PLAN: Discussed with patient that we will hold off on starting medication at this time, but will follow-up regarding her symptoms at her next appointment. ?- Patient instructed to let us know if she has any worsening symptoms of depression. ?- Patient knows to seek immediate medical attention if she has any thoughts of harming herself. ?- We have reached out to chaplain services, who will speak with patient  at her next appointment here.  Patient is agreeable to meeting with chaplain services for spiritual care. ? ?6.  Hypokalemia ?- Potassium today 2.9 (05/25/2021) ?- PLAN: Prescription sent for potassium 20 mEq, with instructions to take 60 mEq today, followed by 20 mEq daily ? ? ? ?Objective:  ? ?Physical Exam:  ?There were no vitals taken for this visit. ?ECOG: 1 ? ?Physical Exam ?Constitutional:   ?   Appearance: Normal appearance.  ?HENT:  ?   Head: Normocephalic and atraumatic.  ?   Mouth/Throat:  ?   Mouth: Mucous membranes are moist.  ?Eyes:  ?   Extraocular Movements: Extraocular movements intact.  ?   Pupils: Pupils are equal, round, and reactive to light.  ?Cardiovascular:  ?   Rate and Rhythm: Normal rate and regular rhythm.  ?   Pulses: Normal pulses.  ?   Heart sounds: Normal heart sounds.  ?Pulmonary:  ?   Effort: Pulmonary effort is normal.  ?   Breath sounds: Normal breath sounds.  ?Abdominal:  ?   General: Bowel sounds are normal.  ?   Palpations: Abdomen is soft.  ?   Tenderness: There is no abdominal tenderness.  ?   Comments: Colostomy bag in place with pink-appearing stoma.  ?Musculoskeletal:     ?  General: No swelling.  ?   Right lower leg: No edema.  ?   Left lower leg: No edema.  ?Lymphadenopathy:  ?   Cervical: No cervical adenopathy.  ?Skin: ?   General: Skin is warm and dry.  ?Neurological:  ?   General: No focal deficit present.  ?   Mental Status: She is alert and oriented to person, place, and time.  ?Psychiatric:     ?   Mood and Affect: Mood normal.     ?   Behavior: Behavior normal.  ? ? ?Lab Review:  ?   ?Component Value Date/Time  ? NA 139 05/17/2021 0835  ? K 3.1 (L) 05/17/2021 0835  ? CL 109 05/17/2021 0835  ? CO2 23 05/17/2021 0835  ? GLUCOSE 95 05/17/2021 0835  ? BUN 6 05/17/2021 0835  ? CREATININE 0.55 05/17/2021 0835  ? CALCIUM 8.6 (L) 05/17/2021 0835  ? PROT 7.0 05/17/2021 0835  ? ALBUMIN 3.2 (L) 05/17/2021 0835  ? AST 15 05/17/2021 0835  ? ALT 15 05/17/2021 0835  ? ALKPHOS  69 05/17/2021 0835  ? BILITOT 0.4 05/17/2021 0835  ? GFRNONAA >60 05/17/2021 0835  ? GFRAA >60 10/26/2015 1317  ? ? ?   ?Component Value Date/Time  ? WBC 5.9 05/17/2021 0835  ? RBC 3.92 05/17/2021 0835  ?

## 2021-05-25 ENCOUNTER — Inpatient Hospital Stay (HOSPITAL_BASED_OUTPATIENT_CLINIC_OR_DEPARTMENT_OTHER): Payer: Medicaid Other | Admitting: Physician Assistant

## 2021-05-25 ENCOUNTER — Inpatient Hospital Stay (HOSPITAL_COMMUNITY): Payer: Medicaid Other

## 2021-05-25 VITALS — BP 132/86 | HR 87 | Temp 98.2°F | Resp 18 | Ht 63.78 in | Wt 126.5 lb

## 2021-05-25 DIAGNOSIS — Z95828 Presence of other vascular implants and grafts: Secondary | ICD-10-CM

## 2021-05-25 DIAGNOSIS — R634 Abnormal weight loss: Secondary | ICD-10-CM | POA: Diagnosis not present

## 2021-05-25 DIAGNOSIS — E876 Hypokalemia: Secondary | ICD-10-CM | POA: Diagnosis not present

## 2021-05-25 DIAGNOSIS — Z09 Encounter for follow-up examination after completed treatment for conditions other than malignant neoplasm: Secondary | ICD-10-CM

## 2021-05-25 DIAGNOSIS — Z5111 Encounter for antineoplastic chemotherapy: Secondary | ICD-10-CM | POA: Diagnosis not present

## 2021-05-25 DIAGNOSIS — C189 Malignant neoplasm of colon, unspecified: Secondary | ICD-10-CM

## 2021-05-25 LAB — COMPREHENSIVE METABOLIC PANEL
ALT: 25 U/L (ref 0–44)
AST: 18 U/L (ref 15–41)
Albumin: 3.6 g/dL (ref 3.5–5.0)
Alkaline Phosphatase: 65 U/L (ref 38–126)
Anion gap: 8 (ref 5–15)
BUN: 6 mg/dL (ref 6–20)
CO2: 22 mmol/L (ref 22–32)
Calcium: 8.9 mg/dL (ref 8.9–10.3)
Chloride: 109 mmol/L (ref 98–111)
Creatinine, Ser: 0.63 mg/dL (ref 0.44–1.00)
GFR, Estimated: >60 mL/min (ref >60)
Glucose, Bld: 100 mg/dL — ABNORMAL HIGH (ref 70–99)
Potassium: 2.9 mmol/L — ABNORMAL LOW (ref 3.5–5.1)
Sodium: 139 mmol/L (ref 135–145)
Total Bilirubin: 0.3 mg/dL (ref 0.3–1.2)
Total Protein: 7.8 g/dL (ref 6.5–8.1)

## 2021-05-25 LAB — CBC WITH DIFFERENTIAL/PLATELET
Abs Immature Granulocytes: 0.01 10*3/uL (ref 0.00–0.07)
Basophils Absolute: 0 10*3/uL (ref 0.0–0.1)
Basophils Relative: 0 %
Eosinophils Absolute: 0 10*3/uL (ref 0.0–0.5)
Eosinophils Relative: 1 %
HCT: 33.8 % — ABNORMAL LOW (ref 36.0–46.0)
Hemoglobin: 10.8 g/dL — ABNORMAL LOW (ref 12.0–15.0)
Immature Granulocytes: 0 %
Lymphocytes Relative: 51 %
Lymphs Abs: 1.8 10*3/uL (ref 0.7–4.0)
MCH: 25.5 pg — ABNORMAL LOW (ref 26.0–34.0)
MCHC: 32 g/dL (ref 30.0–36.0)
MCV: 79.7 fL — ABNORMAL LOW (ref 80.0–100.0)
Monocytes Absolute: 0.2 10*3/uL (ref 0.1–1.0)
Monocytes Relative: 4 %
Neutro Abs: 1.6 10*3/uL — ABNORMAL LOW (ref 1.7–7.7)
Neutrophils Relative %: 44 %
Platelets: 266 10*3/uL (ref 150–400)
RBC: 4.24 MIL/uL (ref 3.87–5.11)
RDW: 18.4 % — ABNORMAL HIGH (ref 11.5–15.5)
WBC: 3.6 10*3/uL — ABNORMAL LOW (ref 4.0–10.5)
nRBC: 0 % (ref 0.0–0.2)

## 2021-05-25 LAB — MAGNESIUM: Magnesium: 2 mg/dL (ref 1.7–2.4)

## 2021-05-25 MED ORDER — MEGESTROL ACETATE 400 MG/10ML PO SUSP: 400.0000 mg | Freq: Two times a day (BID) | ORAL | 2 refills | Status: DC

## 2021-05-25 MED ORDER — HEPARIN SOD (PORK) LOCK FLUSH 100 UNIT/ML IV SOLN
500.0000 [IU] | Freq: Once | INTRAVENOUS | Status: AC
  Administered 2021-05-25: 500 [IU] via INTRAVENOUS

## 2021-05-25 MED ORDER — SODIUM CHLORIDE 0.9% FLUSH
10.0000 mL | Freq: Once | INTRAVENOUS | Status: AC
  Administered 2021-05-25: 10 mL via INTRAVENOUS

## 2021-05-25 MED ORDER — POTASSIUM CHLORIDE CRYS ER 20 MEQ PO TBCR: EXTENDED_RELEASE_TABLET | ORAL | 0 refills | Status: DC

## 2021-05-25 NOTE — Patient Instructions (Addendum)
[place] [facility] ?Discharge Instructions ? ?You were seen today by Tarri Abernethy PA-C for your chemotherapy follow-up visit.   ? ?WEIGHT LOSS: ?- Start taking Megace suspension twice daily to improve your appetite ?- Drink 3-4 Ensure or Boost beverages daily ?- Try to eat as much solid food as you are able. ?- We will schedule you for in person visit with dietitian later this week. ? ?HYDRATION: ?- Make sure you are drinking 64 ounces of water (eight 8-ounce bottles) daily ? ?DEPRESSION: ?- Let us know if you have any worsening symptoms of hopelessness, troubling thoughts, or loss of pleasure in doing the things you normally do. ?- If you have worsening symptoms of depression, we can start you on medications to help with this. ?- It is okay for you to be as active as you are able to be.  If you feel up to doing an activity that you enjoy, do it! ? ?LOW POTASSIUM: ?- Prescription has been sent to your pharmacy for potassium 20 mEq tablets. ?- You will take 3 tablets (60 mEq) TODAY. ?- After today, start taking 1 tablet daily. ? ?FOLLOW-UP APPOINTMENT: Follow-up with Dr. Delton Coombes as scheduled on 06/01/2021. ? ? ?Thank you for choosing [place] at [facility] to provide your oncology and hematology care.  To afford each patient quality time with our provider, please arrive at least 15 minutes before your scheduled appointment time.  ? ?If you have a lab appointment with the [facility] please come in thru the Main Entrance and check in at the main information desk. ? ?You need to re-schedule your appointment should you arrive 10 or more minutes late.  We strive to give you quality time with our providers, and arriving late affects you and other patients whose appointments are after yours.  Also, if you no show three or more times for appointments you may be dismissed from the clinic at the providers discretion.     ?Again, thank you for choosing [facility].  Our hope is that these requests will decrease the amount of time that you wait before being seen by our physicians.       ?_____________________________________________________________ ? ?Should you have questions after your visit to [facility], please contact our office at [phone] and follow the prompts.  Our office hours are 8:00 a.m. and 4:30 p.m. Monday - Friday.  Please note that voicemails left after 4:00 p.m. may not be returned until the following business day.  We are closed weekends and major holidays.  You do have access to a nurse 24-7, just call the main number to the clinic [phone] and do not press any options, hold on the line and a nurse will answer the phone.   ? ?For prescription refill requests, have your pharmacy contact our office and allow 72 hours.   ? ?Due to Covid, you will need to wear a mask upon entering the hospital. If you do not have a mask, a mask will be given to you at the Main Entrance upon arrival. For doctor visits, patients may have 1 support person age 23 or older with them. For treatment visits, patients can not have anyone with them due to social distancing guidelines and our immunocompromised population.  ? ? ? ?

## 2021-05-25 NOTE — Progress Notes (Signed)
Patients port flushed without difficulty.  Good blood return noted with no bruising or swelling noted at site.  Band aid applied.  VSS with discharge and left in satisfactory condition with no s/s of distress noted.   

## 2021-05-27 ENCOUNTER — Inpatient Hospital Stay (HOSPITAL_COMMUNITY): Payer: Medicaid Other | Admitting: Dietician

## 2021-05-27 NOTE — Progress Notes (Signed)
Patient did not show for nutrition appointment. Telephone number listed for patient is no longer in service. Attempted to call patient mother who is listed as alternate contact, however no answer. Left voicemail with request for return call. Contact information provided.  ?

## 2021-05-28 ENCOUNTER — Ambulatory Visit (HOSPITAL_COMMUNITY): Payer: Medicaid Other

## 2021-06-01 ENCOUNTER — Inpatient Hospital Stay (HOSPITAL_BASED_OUTPATIENT_CLINIC_OR_DEPARTMENT_OTHER): Payer: Medicaid Other | Admitting: Hematology

## 2021-06-01 ENCOUNTER — Other Ambulatory Visit: Payer: Self-pay | Admitting: Hematology

## 2021-06-01 ENCOUNTER — Inpatient Hospital Stay (HOSPITAL_COMMUNITY): Payer: Medicaid Other

## 2021-06-01 VITALS — BP 124/87 | HR 73 | Temp 97.6°F | Resp 18 | Ht 63.78 in | Wt 132.2 lb

## 2021-06-01 DIAGNOSIS — D702 Other drug-induced agranulocytosis: Secondary | ICD-10-CM

## 2021-06-01 DIAGNOSIS — E876 Hypokalemia: Secondary | ICD-10-CM

## 2021-06-01 DIAGNOSIS — Z5111 Encounter for antineoplastic chemotherapy: Secondary | ICD-10-CM | POA: Diagnosis not present

## 2021-06-01 DIAGNOSIS — C189 Malignant neoplasm of colon, unspecified: Secondary | ICD-10-CM | POA: Diagnosis not present

## 2021-06-01 DIAGNOSIS — Z95828 Presence of other vascular implants and grafts: Secondary | ICD-10-CM

## 2021-06-01 LAB — CBC WITH DIFFERENTIAL/PLATELET
Basophils Absolute: 0 10*3/uL (ref 0.0–0.1)
Basophils Relative: 0 %
Eosinophils Absolute: 0 10*3/uL (ref 0.0–0.5)
Eosinophils Relative: 1 %
HCT: 31.7 % — ABNORMAL LOW (ref 36.0–46.0)
Hemoglobin: 10.3 g/dL — ABNORMAL LOW (ref 12.0–15.0)
Lymphocytes Relative: 66 %
Lymphs Abs: 2.4 10*3/uL (ref 0.7–4.0)
MCH: 26.1 pg (ref 26.0–34.0)
MCHC: 32.5 g/dL (ref 30.0–36.0)
MCV: 80.3 fL (ref 80.0–100.0)
Monocytes Absolute: 0.4 10*3/uL (ref 0.1–1.0)
Monocytes Relative: 10 %
Neutro Abs: 0.9 10*3/uL — ABNORMAL LOW (ref 1.7–7.7)
Neutrophils Relative %: 23 %
Platelets: 278 10*3/uL (ref 150–400)
RBC: 3.95 MIL/uL (ref 3.87–5.11)
RDW: 19.6 % — ABNORMAL HIGH (ref 11.5–15.5)
WBC: 3.7 10*3/uL — ABNORMAL LOW (ref 4.0–10.5)
nRBC: 0 % (ref 0.0–0.2)

## 2021-06-01 LAB — COMPREHENSIVE METABOLIC PANEL
ALT: 17 U/L (ref 0–44)
AST: 12 U/L — ABNORMAL LOW (ref 15–41)
Albumin: 3.5 g/dL (ref 3.5–5.0)
Alkaline Phosphatase: 52 U/L (ref 38–126)
Anion gap: 6 (ref 5–15)
BUN: 7 mg/dL (ref 6–20)
CO2: 22 mmol/L (ref 22–32)
Calcium: 8.8 mg/dL — ABNORMAL LOW (ref 8.9–10.3)
Chloride: 113 mmol/L — ABNORMAL HIGH (ref 98–111)
Creatinine, Ser: 0.52 mg/dL (ref 0.44–1.00)
GFR, Estimated: >60 mL/min (ref >60)
Glucose, Bld: 94 mg/dL (ref 70–99)
Potassium: 2.7 mmol/L — CL (ref 3.5–5.1)
Sodium: 141 mmol/L (ref 135–145)
Total Bilirubin: 0.4 mg/dL (ref 0.3–1.2)
Total Protein: 7.1 g/dL (ref 6.5–8.1)

## 2021-06-01 LAB — MAGNESIUM: Magnesium: 1.6 mg/dL — ABNORMAL LOW (ref 1.7–2.4)

## 2021-06-01 MED ORDER — POTASSIUM CHLORIDE CRYS ER 20 MEQ PO TBCR: 20.0000 meq | EXTENDED_RELEASE_TABLET | Freq: Two times a day (BID) | ORAL | 6 refills | Status: DC

## 2021-06-01 MED ORDER — SODIUM CHLORIDE 0.9% FLUSH
10.0000 mL | Freq: Once | INTRAVENOUS | Status: AC
  Administered 2021-06-01: 10 mL via INTRAVENOUS

## 2021-06-01 MED ORDER — HEPARIN SOD (PORK) LOCK FLUSH 100 UNIT/ML IV SOLN
500.0000 [IU] | Freq: Once | INTRAVENOUS | Status: AC
  Administered 2021-06-01: 500 [IU] via INTRAVENOUS

## 2021-06-01 MED ORDER — FILGRASTIM-SNDZ 300 MCG/0.5ML IJ SOSY
300.0000 ug | PREFILLED_SYRINGE | Freq: Once | INTRAMUSCULAR | Status: AC
  Administered 2021-06-01: 300 ug via SUBCUTANEOUS
  Filled 2021-06-01: qty 0.5

## 2021-06-01 MED ORDER — MAGNESIUM OXIDE -MG SUPPLEMENT 400 (240 MG) MG PO TABS: 400.0000 mg | ORAL_TABLET | Freq: Two times a day (BID) | ORAL | 6 refills | Status: AC

## 2021-06-01 NOTE — Progress Notes (Signed)
Patients port flushed without difficulty.  Good blood return noted with no bruising or swelling noted at site.  Stable during access and blood draw.  Patient remain accessed for treatment 4/26. ?

## 2021-06-01 NOTE — Progress Notes (Signed)
? ?[name] ?[address] ?[place], [place] [address] ? ? ?CLINIC:  ?Medical Oncology/Hematology ? ?PCP:  ?Caryl Bis, MD ?[address] [place] [address] ?[phone] ? ? 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 ?PRIOR THERAPY: Sigmoid colon resection, partial small bowel resection and Hartman's procedure on 03/26/2021 ? ?NGS Results: KRAS/NRAS negative, BRAF negative, HER2 negative, MSI-stable ? ?CURRENT THERAPY: FOLFOXIRI + Panitumumab q14d ? ?BRIEF ONCOLOGIC HISTORY:  ?Oncology History  ?Colon carcinoma ([place])  ?03/26/2021 Initial Diagnosis  ? Colon carcinoma ([facility]) ? ?  ?04/06/2021 Cancer Staging  ? Staging form: Colon and Rectum, AJCC 8th Edition ?- Clinical stage from 04/06/2021: Stage IVC (cT4a, cN2b, pM1c) - Signed by Derek Jack, MD on 05/04/2021 ?Histopathologic type: Adenocarcinoma, NOS ?Stage prefix: Initial diagnosis ?Total positive nodes: 9 ?Total nodes examined: 9 ?Tumor deposits (TD): Present ?Carcinoembryonic antigen (CEA) (ng/mL): 61.5 ?Perineural invasion (PNI): Present ?Microsatellite instability (MSI): Stable ?KRAS mutation: Negative ?NRAS mutation: Negative ?BRAF mutation: Negative ? ?  ?05/17/2021 -  Chemotherapy  ? Patient is on Treatment Plan : COLORECTAL FOLFOXIRI + Panitumumab q14d  ? ?  ?  ? ? ?CANCER STAGING: ? Cancer Staging  ?Colon carcinoma ([place]) ?Staging form: Colon and Rectum, AJCC 8th Edition ?- Clinical stage from 04/06/2021: Stage IVC (cT4a, cN2b, pM1c) - Signed by Derek Jack, MD on 05/04/2021 ? ? ?INTERVAL HISTORY:  ?[name], a 51 y.o. female, returns for routine follow-up and consideration for next cycle of chemotherapy. Diana Fox was last seen on 04/14/2021. ? ?Due for cycle #2 of FOLFOXIRI + Panitumumab today.  ? ?Overall, she tells me she has been feeling pretty well. She reports diarrhea the day of treatment which resolved. She denies n/v/d over the past 2 weeks. She has  been taking potassium once daily. She denies tingling/numbness and cold sensitivity. She drinks 3 Boost daily. She is taking 1 iron tablet daily and tolerating it well. She denies mouth sores.  ? ?Overall, she feels ready for next cycle of chemo.  ? ?REVIEW OF SYSTEMS:  ?Review of Systems  ?Constitutional:  Negative for appetite change and fatigue.  ?HENT:   Negative for mouth sores.   ?Gastrointestinal:  Negative for diarrhea, nausea and vomiting.  ?Neurological:  Negative for numbness.  ?All other systems reviewed and are negative. ? ?PAST MEDICAL/SURGICAL HISTORY:  ?Past Medical History:  ?Diagnosis Date  ? Anemia   ? Back pain   ? Hypertension   ? Port-A-Cath in place 05/10/2021  ? ?Past Surgical History:  ?Procedure Laterality Date  ? BIOPSY  03/02/2021  ? Procedure: BIOPSY;  Surgeon: Eloise Harman, DO;  [facility];  Service: Endoscopy;;  ? BOWEL RESECTION N/A 03/26/2021  ? Procedure: SMALL BOWEL RESECTION;  Surgeon: Aviva Signs, MD;  Location: [facility];  Service: General;  Laterality: N/A;  ? CHOLECYSTECTOMY    ? COLOSTOMY N/A 03/26/2021  ? Procedure: COLOSTOMY;  Surgeon: Aviva Signs, MD;  Location: [facility];  Service: General;  Laterality: N/A;  ? ESOPHAGOGASTRODUODENOSCOPY (EGD) WITH PROPOFOL N/A 03/02/2021  ? Procedure: ESOPHAGOGASTRODUODENOSCOPY (EGD) WITH PROPOFOL;  Surgeon: Eloise Harman, DO;  [facility];  Service: Endoscopy;  Laterality: N/A;  ? PARTIAL COLECTOMY N/A 03/26/2021  ? Procedure: PARTIAL COLECTOMY;  Surgeon: Aviva Signs, MD;  Location: [facility];  Service: General;  Laterality: N/A;  ? PORTACATH PLACEMENT Left 04/14/2021  ? Procedure: INSERTION PORT-A-CATH;  Surgeon: Aviva Signs, MD;  Location: [facility];  Service: General;  Laterality: Left;  ? SCLEROTHERAPY  03/02/2021  ? Procedure: [id number];  Surgeon: Eloise Harman, DO;  [facility];  Service: Endoscopy;;  ? SIGMOIDOSCOPY  03/02/2021  ? Procedure: SIGMOIDOSCOPY;  Surgeon: Eloise Harman, DO;   [facility];  Service: Endoscopy;;  ? ? ?SOCIAL HISTORY:  ?Social History  ? ?Socioeconomic History  ? Marital status: Married  ?  Spouse name: Not on file  ? Number of children: Not on file  ? Years of education: Not on file  ? Highest education level: Not on file  ?Occupational History  ? Not on file  ?Tobacco Use  ? Smoking status: Never  ? Smokeless tobacco: Never  ?Vaping Use  ? Vaping Use: Never used  ?Substance and Sexual Activity  ? Alcohol use: No  ? Drug use: No  ? Sexual activity: Not on file  ?Other Topics Concern  ? Not on file  ?Social History Narrative  ? Not on file  ? ?Social Determinants of Health  ? ?Financial Resource Strain: High Risk  ? Difficulty of Paying Living Expenses: Hard  ?Food Insecurity: Not on file  ?Transportation Needs: Not on file  ?Physical Activity: Not on file  ?Stress: Not on file  ?Social Connections: Not on file  ?Intimate Partner Violence: Not on file  ? ? ?FAMILY HISTORY:  ?Family History  ?Problem Relation Age of Onset  ? Hypertension Mother   ? Hypertension Father   ? Colon cancer Neg Hx   ? Colon polyps Neg Hx   ? Inflammatory bowel disease Neg Hx   ? ? ?CURRENT MEDICATIONS:  ?Current Outpatient Medications  ?Medication Sig Dispense Refill  ? ferrous sulfate 325 (65 FE) MG tablet TAKE 325 MG BY MOUTH DAILY. (Patient taking differently: 325 mg daily with breakfast.) 90 tablet 1  ? fexofenadine (ALLEGRA) 180 MG tablet Take 1 tablet (180 mg total) by mouth daily. 30 tablet 0  ? fluorouracil CALGB 42595 2,400 mg/m2 in sodium chloride 0.9 % 150 mL Inject 2,400 mg/m2 into the vein over 48 hr. Every 14 days    ? FLUOROURACIL IV Inject into the vein every 14 (fourteen) days.    ? fluticasone (FLONASE) 50 MCG/ACT nasal spray Place 2 sprays into both nostrils daily. 16 g 12  ? HYDROcodone-acetaminophen (NORCO) 7.5-325 MG tablet Take 1 tablet by mouth every 6 (six) hours as needed for moderate pain. 28 tablet 0  ? LEUCOVORIN CALCIUM IV Inject into the vein every 14  (fourteen) days.    ? lidocaine-prilocaine (EMLA) cream Apply a small amount to port a cath site and cover with plastic wrap 1 hour prior to chemotherapy appointments (Patient not taking: Reported on 05/25/2021) 30 g 3  ? medroxyPROGESTERone (DEPO-PROVERA) 150 MG/ML injection Inject 150 mg into the muscle every 3 (three) months.    ? medroxyPROGESTERone (PROVERA) 5 MG tablet Take 5 mg by mouth every other day.    ? megestrol (MEGACE) 400 MG/10ML suspension Take 10 mLs (400 mg total) by mouth 2 (two) times daily. 240 mL 2  ? Multiple Vitamin (MULTIVITAMIN) tablet Take 1 tablet by mouth daily.    ? nebivolol (BYSTOLIC) 10 MG tablet Take 1 tablet (10 mg total) by mouth daily. 30 tablet 1  ? ondansetron (ZOFRAN) 4 MG tablet Take 1 tablet (4 mg total) by mouth every 8 (eight) hours as needed for nausea or vomiting. (Patient not taking: Reported on 05/25/2021) 20 tablet 0  ? OXALIPLATIN IV Inject into the vein  every 14 (fourteen) days.    ? Panitumumab (VECTIBIX IV) Inject into the vein every 14 (fourteen) days.    ? pantoprazole (PROTONIX) 40 MG tablet Take 40 mg by mouth daily as needed (acid reflux).    ? potassium chloride SA (KLOR-CON M) 20 MEQ tablet Take 3 tablets (60 mEq total) by mouth daily for 1 day, THEN 1 tablet (20 mEq total) daily. 33 tablet 0  ? prochlorperazine (COMPAZINE) 10 MG tablet Take 1 tablet (10 mg total) by mouth every 6 (six) hours as needed (Nausea or vomiting). (Patient not taking: Reported on 05/25/2021) 30 tablet 1  ? ?No current facility-administered medications for this visit.  ? ? ?ALLERGIES:  ?Allergies  ?Allergen Reactions  ? Azithromycin Hives  ?  infusing arm began to swell , her face got red, and she broke out, there are bumps around where the IV site was infusing  ? Motrin [Ibuprofen] Itching  ? Lisinopril   ?  cough  ? Sudafed [Pseudoephedrine Hcl] Other (See Comments)  ?  Hypertension  ? ? ?PHYSICAL EXAM:  ?Performance status (ECOG): 1 - Symptomatic but completely ambulatory ? ?There  were no vitals filed for this visit. ?Wt Readings from Last 3 Encounters:  ?05/25/21 126 lb 8.7 oz (57.4 kg)  ?05/17/21 139 lb 1.6 oz (63.1 kg)  ?05/04/21 132 lb 1.6 oz (59.9 kg)  ? ?Physical Exam ?Vitals r

## 2021-06-01 NOTE — Progress Notes (Signed)
CRITICAL VALUE STICKER ? ?CRITICAL VALUE: potassium 2.7 ? ?RECEIVER (on-site recipient of call): C. Festerman ? ?DATE & TIME NOTIFIED: 06/01/2021 at 1125 ? ?MESSENGER (representative from lab): [employer], rn ? ?MD NOTIFIED: Delton Coombes at 1129 ? ?TIME OF NOTIFICATION:1129 ? ?RESPONSE:  Oncologist  made aware.  ?

## 2021-06-01 NOTE — Patient Instructions (Addendum)
[facility] at [facility] ?Discharge Instructions ? ? ?You were seen and examined today by Dr. Delton Coombes. ? ?He reviewed your lab work.  ? ?We will proceed with your treatment tomorrow.  ? ?Return as scheduled.  ? ? ?Thank you for choosing [facility] at [facility] to provide your oncology and hematology care.  To afford each patient quality time with our provider, please arrive at least 15 minutes before your scheduled appointment time.  ? ?If you have a lab appointment with the [facility] please come in thru the Main Entrance and check in at the main information desk. ? ?You need to re-schedule your appointment should you arrive 10 or more minutes late.  We strive to give you quality time with our providers, and arriving late affects you and other patients whose appointments are after yours.  Also, if you no show three or more times for appointments you may be dismissed from the clinic at the providers discretion.     ?Again, thank you for choosing [facility].  Our hope is that these requests will decrease the amount of time that you wait before being seen by our physicians.       ?_____________________________________________________________ ? ?Should you have questions after your visit to [facility], please contact our office at [phone] and follow the prompts.  Our office hours are 8:00 a.m. and 4:30 p.m. Monday - Friday.  Please note that voicemails left after 4:00 p.m. may not be returned until the following business day.  We are closed weekends and major holidays.  You do have access to a nurse 24-7, just call the main number to the clinic [phone] and do not press any options, hold on the line and a nurse will answer the phone.   ? ?For prescription refill requests, have your pharmacy contact our office and allow 72 hours.   ? ?Due to Covid, you will need to wear a mask upon entering the hospital. If you do not have a  mask, a mask will be given to you at the Main Entrance upon arrival. For doctor visits, patients may have 1 support person age 3 or older with them. For treatment visits, patients can not have anyone with them due to social distancing guidelines and our immunocompromised population.  ? ?   ?

## 2021-06-01 NOTE — Progress Notes (Signed)
Patient tolerated injection with no complaints voiced.  Site clean and dry with no bruising or swelling noted at site.  See MAR for details.  Band aid applied.  Patient stable during and after injection.  Vss with discharge and left in satisfactory condition with no s/s of distress noted.  

## 2021-06-01 NOTE — Patient Instructions (Signed)
[facility]  Discharge Instructions: Thank you for choosing [facility] to provide your oncology and hematology care.  If you have a lab appointment with the Cancer Center, please come in thru the Main Entrance and check in at the main information desk.  Wear comfortable clothing and clothing appropriate for easy access to any Portacath or PICC line.   We strive to give you quality time with your provider. You may need to reschedule your appointment if you arrive late (15 or more minutes).  Arriving late affects you and other patients whose appointments are after yours.  Also, if you miss three or more appointments without notifying the office, you may be dismissed from the clinic at the provider's discretion.      For prescription refill requests, have your pharmacy contact our office and allow 72 hours for refills to be completed.        To help prevent nausea and vomiting after your treatment, we encourage you to take your nausea medication as directed.  BELOW ARE SYMPTOMS THAT SHOULD BE REPORTED IMMEDIATELY: *FEVER GREATER THAN 100.4 F (38 C) OR HIGHER *CHILLS OR SWEATING *NAUSEA AND VOMITING THAT IS NOT CONTROLLED WITH YOUR NAUSEA MEDICATION *UNUSUAL SHORTNESS OF BREATH *UNUSUAL BRUISING OR BLEEDING *URINARY PROBLEMS (pain or burning when urinating, or frequent urination) *BOWEL PROBLEMS (unusual diarrhea, constipation, pain near the anus) TENDERNESS IN MOUTH AND THROAT WITH OR WITHOUT PRESENCE OF ULCERS (sore throat, sores in mouth, or a toothache) UNUSUAL RASH, SWELLING OR PAIN  UNUSUAL VAGINAL DISCHARGE OR ITCHING   Items with * indicate a potential emergency and should be followed up as soon as possible or go to the Emergency Department if any problems should occur.  Please show the CHEMOTHERAPY ALERT CARD or IMMUNOTHERAPY ALERT CARD at check-in to the Emergency Department and triage nurse.  Should you have questions after your visit or need to cancel  or reschedule your appointment, please contact [facility] [phone]  and follow the prompts.  Office hours are 8:00 a.m. to 4:30 p.m. Monday - Friday. Please note that voicemails left after 4:00 p.m. may not be returned until the following business day.  We are closed weekends and major holidays. You have access to a nurse at all times for urgent questions. Please call the main number to the clinic [phone] and follow the prompts.  For any non-urgent questions, you may also contact your provider using MyChart. We now offer e-Visits for anyone 18 and older to request care online for non-urgent symptoms. For details visit [email or web address].   Also download the MyChart app! Go to the app store, search "MyChart", open the app, select [facility], and log in with your MyChart username and password.  Due to Covid, a mask is required upon entering the hospital/clinic. If you do not have a mask, one will be given to you upon arrival. For doctor visits, patients may have 1 support person aged 18 or older with them. For treatment visits, patients cannot have anyone with them due to current Covid guidelines and our immunocompromised population.  

## 2021-06-02 ENCOUNTER — Other Ambulatory Visit (HOSPITAL_COMMUNITY): Payer: Self-pay

## 2021-06-02 ENCOUNTER — Encounter (HOSPITAL_COMMUNITY): Payer: Self-pay | Admitting: Surgery

## 2021-06-02 ENCOUNTER — Inpatient Hospital Stay (HOSPITAL_COMMUNITY): Payer: Medicaid Other

## 2021-06-02 ENCOUNTER — Encounter (HOSPITAL_COMMUNITY): Payer: Self-pay

## 2021-06-02 VITALS — BP 134/70 | HR 85 | Temp 97.8°F | Resp 18

## 2021-06-02 DIAGNOSIS — E876 Hypokalemia: Secondary | ICD-10-CM

## 2021-06-02 DIAGNOSIS — Z5111 Encounter for antineoplastic chemotherapy: Secondary | ICD-10-CM | POA: Diagnosis not present

## 2021-06-02 DIAGNOSIS — C189 Malignant neoplasm of colon, unspecified: Secondary | ICD-10-CM

## 2021-06-02 DIAGNOSIS — Z95828 Presence of other vascular implants and grafts: Secondary | ICD-10-CM

## 2021-06-02 LAB — CBC WITH DIFFERENTIAL/PLATELET
Abs Immature Granulocytes: 0.03 10*3/uL (ref 0.00–0.07)
Basophils Absolute: 0 10*3/uL (ref 0.0–0.1)
Basophils Relative: 0 %
Eosinophils Absolute: 0.1 10*3/uL (ref 0.0–0.5)
Eosinophils Relative: 1 %
HCT: 32.3 % — ABNORMAL LOW (ref 36.0–46.0)
Hemoglobin: 10.5 g/dL — ABNORMAL LOW (ref 12.0–15.0)
Immature Granulocytes: 0 %
Lymphocytes Relative: 46 %
Lymphs Abs: 3.2 10*3/uL (ref 0.7–4.0)
MCH: 26.4 pg (ref 26.0–34.0)
MCHC: 32.5 g/dL (ref 30.0–36.0)
MCV: 81.2 fL (ref 80.0–100.0)
Monocytes Absolute: 0.7 10*3/uL (ref 0.1–1.0)
Monocytes Relative: 11 %
Neutro Abs: 2.9 10*3/uL (ref 1.7–7.7)
Neutrophils Relative %: 42 %
Platelets: 308 10*3/uL (ref 150–400)
RBC: 3.98 MIL/uL (ref 3.87–5.11)
RDW: 20.2 % — ABNORMAL HIGH (ref 11.5–15.5)
WBC: 7 10*3/uL (ref 4.0–10.5)
nRBC: 0 % (ref 0.0–0.2)

## 2021-06-02 LAB — POTASSIUM: Potassium: 3.3 mmol/L — ABNORMAL LOW (ref 3.5–5.1)

## 2021-06-02 MED ORDER — ATROPINE SULFATE 1 MG/ML IV SOLN
0.5000 mg | Freq: Once | INTRAVENOUS | Status: AC
  Administered 2021-06-02: 0.5 mg via INTRAVENOUS
  Filled 2021-06-02: qty 1

## 2021-06-02 MED ORDER — DEXTROSE 5 % IV SOLN: Freq: Once | INTRAVENOUS | Status: AC

## 2021-06-02 MED ORDER — SODIUM CHLORIDE 0.9 % IV SOLN: Freq: Once | INTRAVENOUS | Status: AC

## 2021-06-02 MED ORDER — SODIUM CHLORIDE 0.9 % IV SOLN
165.0000 mg/m2 | Freq: Once | INTRAVENOUS | Status: AC
  Administered 2021-06-02: 280 mg via INTRAVENOUS
  Filled 2021-06-02: qty 10

## 2021-06-02 MED ORDER — PALONOSETRON HCL INJECTION 0.25 MG/5ML
0.2500 mg | Freq: Once | INTRAVENOUS | Status: AC
  Administered 2021-06-02: 0.25 mg via INTRAVENOUS
  Filled 2021-06-02: qty 5

## 2021-06-02 MED ORDER — LEUCOVORIN CALCIUM INJECTION 350 MG
200.0000 mg/m2 | Freq: Once | INTRAVENOUS | Status: AC
  Administered 2021-06-02: 332 mg via INTRAVENOUS
  Filled 2021-06-02: qty 16.6

## 2021-06-02 MED ORDER — SODIUM CHLORIDE 0.9 % IV SOLN
2400.0000 mg/m2 | INTRAVENOUS | Status: DC
  Administered 2021-06-02: 4000 mg via INTRAVENOUS
  Filled 2021-06-02: qty 80

## 2021-06-02 MED ORDER — OXALIPLATIN CHEMO INJECTION 100 MG/20ML
85.0000 mg/m2 | Freq: Once | INTRAVENOUS | Status: AC
  Administered 2021-06-02: 140 mg via INTRAVENOUS
  Filled 2021-06-02: qty 10

## 2021-06-02 MED ORDER — SODIUM CHLORIDE 0.9 % IV SOLN
400.0000 mg | Freq: Once | INTRAVENOUS | Status: AC
  Administered 2021-06-02: 400 mg via INTRAVENOUS
  Filled 2021-06-02: qty 20

## 2021-06-02 MED ORDER — SODIUM CHLORIDE 0.9 % IV SOLN
150.0000 mg | Freq: Once | INTRAVENOUS | Status: AC
  Administered 2021-06-02: 150 mg via INTRAVENOUS
  Filled 2021-06-02: qty 150

## 2021-06-02 MED ORDER — POTASSIUM CHLORIDE CRYS ER 20 MEQ PO TBCR
40.0000 meq | EXTENDED_RELEASE_TABLET | Freq: Once | ORAL | Status: AC
  Administered 2021-06-02: 40 meq via ORAL
  Filled 2021-06-02: qty 2

## 2021-06-02 MED ORDER — SODIUM CHLORIDE 0.9 % IV SOLN
10.0000 mg | Freq: Once | INTRAVENOUS | Status: AC
  Administered 2021-06-02: 10 mg via INTRAVENOUS
  Filled 2021-06-02: qty 10

## 2021-06-02 MED ORDER — SODIUM CHLORIDE 0.9% FLUSH
10.0000 mL | INTRAVENOUS | Status: DC | PRN
  Administered 2021-06-02 (×2): 10 mL

## 2021-06-02 NOTE — Progress Notes (Signed)
A request for assessment for personal care services attestation of medical need was faxed to [facility] on 06/02/21.  Confirmation #[id number]. ?

## 2021-06-02 NOTE — Progress Notes (Signed)
Patient presents for treatment today, labs within treatment parameters, no complaints from patient at this time. Potassium 3.3, 40 mEq of Potassium ordered per standing orders.  ?Patient tolerated chemotherapy with no complaints voiced. Side effects with management reviewed understanding verbalized. Port site clean and dry with no bruising or swelling noted at site. Good blood return noted before and after administration of chemotherapy. Chemo pump connected, with no alarms noted. Patient left in satisfactory condition with VSS and no s/s of distress noted.  ?

## 2021-06-02 NOTE — Patient Instructions (Signed)
[facility]  Discharge Instructions: ?Thank you for choosing [facility] to provide your oncology and hematology care.  ?If you have a lab appointment with the [facility], please come in thru the Main Entrance and check in at the main information desk. ? ?Wear comfortable clothing and clothing appropriate for easy access to any Portacath or PICC line.  ? ?We strive to give you quality time with your provider. You may need to reschedule your appointment if you arrive late (15 or more minutes).  Arriving late affects you and other patients whose appointments are after yours.  Also, if you miss three or more appointments without notifying the office, you may be dismissed from the clinic at the provider?s discretion.    ?  ?For prescription refill requests, have your pharmacy contact our office and allow 72 hours for refills to be completed.   ? ?Today you received the following chemotherapy and/or immunotherapy agents Vectibix/Folfoxiri, return as scheduled. ?  ?To help prevent nausea and vomiting after your treatment, we encourage you to take your nausea medication as directed. ? ?BELOW ARE SYMPTOMS THAT SHOULD BE REPORTED IMMEDIATELY: ?*FEVER GREATER THAN 100.4 F (38 ?C) OR HIGHER ?*CHILLS OR SWEATING ?*NAUSEA AND VOMITING THAT IS NOT CONTROLLED WITH YOUR NAUSEA MEDICATION ?*UNUSUAL SHORTNESS OF BREATH ?*UNUSUAL BRUISING OR BLEEDING ?*URINARY PROBLEMS (pain or burning when urinating, or frequent urination) ?*BOWEL PROBLEMS (unusual diarrhea, constipation, pain near the anus) ?TENDERNESS IN MOUTH AND THROAT WITH OR WITHOUT PRESENCE OF ULCERS (sore throat, sores in mouth, or a toothache) ?UNUSUAL RASH, SWELLING OR PAIN  ?UNUSUAL VAGINAL DISCHARGE OR ITCHING  ? ?Items with * indicate a potential emergency and should be followed up as soon as possible or go to the Emergency Department if any problems should occur. ? ?Please show the CHEMOTHERAPY ALERT CARD or IMMUNOTHERAPY ALERT CARD at  check-in to the Emergency Department and triage nurse. ? ?Should you have questions after your visit or need to cancel or reschedule your appointment, please contact [facility] [phone]  and follow the prompts.  Office hours are 8:00 a.m. to 4:30 p.m. Monday - Friday. Please note that voicemails left after 4:00 p.m. may not be returned until the following business day.  We are closed weekends and major holidays. You have access to a nurse at all times for urgent questions. Please call the main number to the clinic [phone] and follow the prompts. ? ?For any non-urgent questions, you may also contact your provider using MyChart. We now offer e-Visits for anyone 78 and older to request care online for non-urgent symptoms. For details visit [email or web address]. ?  ?Also download the MyChart app! Go to the app store, search "MyChart", open the app, select [facility], and log in with your MyChart username and password. ? ?Due to Covid, a mask is required upon entering the hospital/clinic. If you do not have a mask, one will be given to you upon arrival. For doctor visits, patients may have 1 support person aged 26 or older with them. For treatment visits, patients cannot have anyone with them due to current Covid guidelines and our immunocompromised population.  ?

## 2021-06-03 ENCOUNTER — Encounter (HOSPITAL_COMMUNITY): Payer: Medicaid Other

## 2021-06-04 ENCOUNTER — Ambulatory Visit (HOSPITAL_COMMUNITY): Payer: Medicaid Other

## 2021-06-04 ENCOUNTER — Inpatient Hospital Stay (HOSPITAL_COMMUNITY): Payer: Medicaid Other

## 2021-06-04 VITALS — BP 150/97 | HR 78 | Temp 98.0°F | Resp 18

## 2021-06-04 DIAGNOSIS — Z95828 Presence of other vascular implants and grafts: Secondary | ICD-10-CM

## 2021-06-04 DIAGNOSIS — Z5111 Encounter for antineoplastic chemotherapy: Secondary | ICD-10-CM | POA: Diagnosis not present

## 2021-06-04 DIAGNOSIS — C189 Malignant neoplasm of colon, unspecified: Secondary | ICD-10-CM

## 2021-06-04 MED ORDER — SODIUM CHLORIDE 0.9% FLUSH
10.0000 mL | INTRAVENOUS | Status: DC | PRN
  Administered 2021-06-04: 10 mL

## 2021-06-04 MED ORDER — HEPARIN SOD (PORK) LOCK FLUSH 100 UNIT/ML IV SOLN
500.0000 [IU] | Freq: Once | INTRAVENOUS | Status: AC | PRN
  Administered 2021-06-04: 500 [IU]

## 2021-06-04 MED ORDER — PEGFILGRASTIM-CBQV 6 MG/0.6ML ~~LOC~~ SOSY
6.0000 mg | PREFILLED_SYRINGE | Freq: Once | SUBCUTANEOUS | Status: AC
  Administered 2021-06-04: 6 mg via SUBCUTANEOUS
  Filled 2021-06-04: qty 0.6

## 2021-06-04 NOTE — Patient Instructions (Signed)
[facility]  Discharge Instructions: ?Thank you for choosing [facility] to provide your oncology and hematology care.  ?If you have a lab appointment with the [facility], please come in thru the Main Entrance and check in at the main information desk. ? ?Wear comfortable clothing and clothing appropriate for easy access to any Portacath or PICC line.  ? ?We strive to give you quality time with your provider. You may need to reschedule your appointment if you arrive late (15 or more minutes).  Arriving late affects you and other patients whose appointments are after yours.  Also, if you miss three or more appointments without notifying the office, you may be dismissed from the clinic at the provider?s discretion.    ?  ?For prescription refill requests, have your pharmacy contact our office and allow 72 hours for refills to be completed.   ? ?Today you received the following chemotherapy and/or immunotherapy agents Pump stop and Udenyca injection    ?  ?To help prevent nausea and vomiting after your treatment, we encourage you to take your nausea medication as directed. ? ?BELOW ARE SYMPTOMS THAT SHOULD BE REPORTED IMMEDIATELY: ?*FEVER GREATER THAN 100.4 F (38 ?C) OR HIGHER ?*CHILLS OR SWEATING ?*NAUSEA AND VOMITING THAT IS NOT CONTROLLED WITH YOUR NAUSEA MEDICATION ?*UNUSUAL SHORTNESS OF BREATH ?*UNUSUAL BRUISING OR BLEEDING ?*URINARY PROBLEMS (pain or burning when urinating, or frequent urination) ?*BOWEL PROBLEMS (unusual diarrhea, constipation, pain near the anus) ?TENDERNESS IN MOUTH AND THROAT WITH OR WITHOUT PRESENCE OF ULCERS (sore throat, sores in mouth, or a toothache) ?UNUSUAL RASH, SWELLING OR PAIN  ?UNUSUAL VAGINAL DISCHARGE OR ITCHING  ? ?Items with * indicate a potential emergency and should be followed up as soon as possible or go to the Emergency Department if any problems should occur. ? ?Please show the CHEMOTHERAPY ALERT CARD or IMMUNOTHERAPY ALERT CARD at check-in  to the Emergency Department and triage nurse. ? ?Should you have questions after your visit or need to cancel or reschedule your appointment, please contact [facility] [phone]  and follow the prompts.  Office hours are 8:00 a.m. to 4:30 p.m. Monday - Friday. Please note that voicemails left after 4:00 p.m. may not be returned until the following business day.  We are closed weekends and major holidays. You have access to a nurse at all times for urgent questions. Please call the main number to the clinic [phone] and follow the prompts. ? ?For any non-urgent questions, you may also contact your provider using MyChart. We now offer e-Visits for anyone 77 and older to request care online for non-urgent symptoms. For details visit [email or web address]. ?  ?Also download the MyChart app! Go to the app store, search "MyChart", open the app, select [facility], and log in with your MyChart username and password. ? ?Due to Covid, a mask is required upon entering the hospital/clinic. If you do not have a mask, one will be given to you upon arrival. For doctor visits, patients may have 1 support person aged 28 or older with them. For treatment visits, patients cannot have anyone with them due to current Covid guidelines and our immunocompromised population.  ?

## 2021-06-04 NOTE — Progress Notes (Signed)
Patient presents today for 5FU pump stop and disconnection after 46 hour continous infusion.   5FU pump deaccessed.  Patients port flushed without difficulty.  Good blood return noted with no bruising or swelling noted at site.  Needle removed intact.  Band aid applied.  Udenyca administration without incident; injection site WNL; see MAR for injection details.  Patient tolerated procedure well and without incident.  No questions or complaints noted at this time. VSS with discharge and left in satisfactory condition ambulatory with no s/s of distress noted.    ?

## 2021-06-07 ENCOUNTER — Other Ambulatory Visit (HOSPITAL_COMMUNITY): Payer: Self-pay | Admitting: *Deleted

## 2021-06-07 MED ORDER — SUCRALFATE 1 GM/10ML PO SUSP
1.0000 g | Freq: Four times a day (QID) | ORAL | 0 refills | Status: DC | PRN
Start: 2021-06-07 — End: 2021-06-09

## 2021-06-07 MED ORDER — LIDOCAINE VISCOUS HCL 2 % MT SOLN
10.0000 mL | Freq: Four times a day (QID) | OROMUCOSAL | Status: DC | PRN
Start: 2021-06-07 — End: 2021-06-08

## 2021-06-07 MED ORDER — SUCRALFATE 1 GM/10ML PO SUSP: 1.0000 g | Freq: Four times a day (QID) | ORAL | 0 refills | Status: DC | PRN

## 2021-06-07 MED ORDER — LIDOCAINE VISCOUS HCL 2 % MT SOLN: 10.0000 mL | Freq: Four times a day (QID) | OROMUCOSAL | Status: DC | PRN

## 2021-06-07 NOTE — Telephone Encounter (Signed)
Patient called and states that she has her mouth has become irritated and has sores .  Advised that we would send in a script for Carafate and 2% lidocaine to mix 1:1 and swish and spit 4 times daily as needed.  She will notify us if this does not take care of the irritation and verbalized understanding. ?

## 2021-06-08 ENCOUNTER — Encounter: Payer: Self-pay | Admitting: Internal Medicine

## 2021-06-08 ENCOUNTER — Other Ambulatory Visit (HOSPITAL_COMMUNITY): Payer: Self-pay

## 2021-06-08 DIAGNOSIS — K1379 Other lesions of oral mucosa: Secondary | ICD-10-CM

## 2021-06-08 DIAGNOSIS — C189 Malignant neoplasm of colon, unspecified: Secondary | ICD-10-CM

## 2021-06-08 MED ORDER — LIDOCAINE VISCOUS HCL 2 % MT SOLN: 10.0000 mL | Freq: Four times a day (QID) | OROMUCOSAL | Status: DC | PRN

## 2021-06-08 NOTE — Telephone Encounter (Signed)
Prescription sent to [facility] for mouth sores.  [facility] unable to fill.   ?

## 2021-06-09 ENCOUNTER — Other Ambulatory Visit (HOSPITAL_COMMUNITY): Payer: Self-pay | Admitting: *Deleted

## 2021-06-09 MED ORDER — SUCRALFATE 1 GM/10ML PO SUSP
1.0000 g | Freq: Four times a day (QID) | ORAL | 0 refills | Status: DC | PRN
Start: 2021-06-09 — End: 2021-06-29

## 2021-06-11 ENCOUNTER — Ambulatory Visit (HOSPITAL_COMMUNITY)
Admission: RE | Admit: 2021-06-11 | Discharge: 2021-06-11 | Disposition: A | Discharge status: Home / Self Care | Payer: Medicaid Other | Source: Ambulatory Visit | Attending: Nurse Practitioner | Admitting: Nurse Practitioner

## 2021-06-11 DIAGNOSIS — Z933 Colostomy status: Secondary | ICD-10-CM | POA: Diagnosis not present

## 2021-06-11 DIAGNOSIS — Z433 Encounter for attention to colostomy: Secondary | ICD-10-CM | POA: Diagnosis not present

## 2021-06-11 NOTE — Discharge Instructions (Signed)
Continue 1 piece convex (samples provided) - cut 1 3/8" opening ?Stoma powder and skin prep to skin around stoma ?Barrier ring around stoma ?Empty when 1/3 full ? ?

## 2021-06-11 NOTE — Progress Notes (Signed)
[facility]  ? ?Reason for visit:  ?LLQ colostomy, peristomal skin improved. Is interested in gaining independence in ostomy care.  ?HPI:  ?Carcinoma colon with resection, colostomy ?ROS  ?Review of Systems  ?Gastrointestinal:   ?     LLQ colostomy  ?Skin: Negative.   ?All other systems reviewed and are negative. ?Vital signs:  ?BP 116/86 (BP Location: Right Arm)   Pulse 94   Temp 98.4 ?F (36.9 ?C) (Oral)   Resp 18   Ht '5\' 5"'$  (1.651 m)   SpO2 100%   BMI 22.00 kg/m?  ?Exam:  ?Physical Exam ?Abdominal:  ?   Palpations: Abdomen is soft.  ?Skin: ?   General: Skin is warm and dry.  ?Neurological:  ?   Mental Status: She is oriented to person, place, and time.  ?Psychiatric:     ?   Mood and Affect: Mood normal.     ?   Behavior: Behavior normal.  ?  ?Stoma type/location:  1 1/2" pink and moist ?Stomal assessment/size:  1 1/2"  ?Peristomal assessment:  intact ?Treatment options for stomal/peristomal skin: 1 piece convex pouch, has transitioned back to hollister.  ?Output: soft brown ?Ostomy pouching: 1pc.convex with stoma powder  and skin prep ?Education provided:  Pouch changed.  Patient can do it, but she is now living alone and wants to get more comfortable. Emotional support provided.  ? ?  ?Impression/dx  ?colostomy ?Discussion  ?See back as needed ?Plan  ?Supply order sent to [employer].  ? ? ? ?Visit time: 45 minutes.  ? ?Domenic Moras FNP-BC ? ?  ?

## 2021-06-14 ENCOUNTER — Ambulatory Visit (HOSPITAL_COMMUNITY): Payer: Medicaid Other | Admitting: Dietician

## 2021-06-14 ENCOUNTER — Telehealth (HOSPITAL_COMMUNITY): Payer: Self-pay | Admitting: Dietician

## 2021-06-14 NOTE — Progress Notes (Signed)
Patient did not show for nutrition appointment today. Attempted to contact patient via telephone to offer another appointment time. Patient did not answer. Unable to leave message. This is the second missed appointment.  ? ? ?

## 2021-06-14 NOTE — Telephone Encounter (Signed)
Received call from patient this afternoon. Patient agreeable to rescheduling missed nutrition appointment. She prefers a telephone visit. Appointment scheduled for 5/11. Patient aware.  ?

## 2021-06-15 ENCOUNTER — Inpatient Hospital Stay (HOSPITAL_COMMUNITY): Payer: Medicaid Other | Attending: Hematology

## 2021-06-15 ENCOUNTER — Inpatient Hospital Stay (HOSPITAL_COMMUNITY): Payer: Medicaid Other

## 2021-06-15 VITALS — BP 145/75 | HR 70 | Temp 98.2°F | Resp 18 | Ht 62.4 in | Wt 133.4 lb

## 2021-06-15 DIAGNOSIS — C189 Malignant neoplasm of colon, unspecified: Secondary | ICD-10-CM

## 2021-06-15 DIAGNOSIS — Z79899 Other long term (current) drug therapy: Secondary | ICD-10-CM | POA: Insufficient documentation

## 2021-06-15 DIAGNOSIS — C187 Malignant neoplasm of sigmoid colon: Secondary | ICD-10-CM | POA: Diagnosis not present

## 2021-06-15 DIAGNOSIS — E876 Hypokalemia: Secondary | ICD-10-CM | POA: Diagnosis not present

## 2021-06-15 DIAGNOSIS — D649 Anemia, unspecified: Secondary | ICD-10-CM | POA: Insufficient documentation

## 2021-06-15 DIAGNOSIS — Z5111 Encounter for antineoplastic chemotherapy: Secondary | ICD-10-CM | POA: Diagnosis not present

## 2021-06-15 DIAGNOSIS — I10 Essential (primary) hypertension: Secondary | ICD-10-CM | POA: Insufficient documentation

## 2021-06-15 DIAGNOSIS — Z95828 Presence of other vascular implants and grafts: Secondary | ICD-10-CM

## 2021-06-15 DIAGNOSIS — M549 Dorsalgia, unspecified: Secondary | ICD-10-CM | POA: Insufficient documentation

## 2021-06-15 DIAGNOSIS — R21 Rash and other nonspecific skin eruption: Secondary | ICD-10-CM | POA: Insufficient documentation

## 2021-06-15 DIAGNOSIS — R634 Abnormal weight loss: Secondary | ICD-10-CM | POA: Diagnosis not present

## 2021-06-15 DIAGNOSIS — Z5189 Encounter for other specified aftercare: Secondary | ICD-10-CM | POA: Diagnosis not present

## 2021-06-15 LAB — CBC WITH DIFFERENTIAL/PLATELET
Basophils Absolute: 0 10*3/uL (ref 0.0–0.1)
Basophils Relative: 0 %
Eosinophils Absolute: 0 10*3/uL (ref 0.0–0.5)
Eosinophils Relative: 0 %
HCT: 34.2 % — ABNORMAL LOW (ref 36.0–46.0)
Hemoglobin: 11.3 g/dL — ABNORMAL LOW (ref 12.0–15.0)
Lymphocytes Relative: 26 %
Lymphs Abs: 2.4 10*3/uL (ref 0.7–4.0)
MCH: 27 pg (ref 26.0–34.0)
MCHC: 33 g/dL (ref 30.0–36.0)
MCV: 81.6 fL (ref 80.0–100.0)
Metamyelocytes Relative: 1 %
Monocytes Absolute: 0.5 10*3/uL (ref 0.1–1.0)
Monocytes Relative: 5 %
Myelocytes: 5 %
Neutro Abs: 5.8 10*3/uL (ref 1.7–7.7)
Neutrophils Relative %: 63 %
Platelets: 210 10*3/uL (ref 150–400)
RBC: 4.19 MIL/uL (ref 3.87–5.11)
RDW: 21.5 % — ABNORMAL HIGH (ref 11.5–15.5)
WBC: 9.2 10*3/uL (ref 4.0–10.5)
nRBC: 0.5 % — ABNORMAL HIGH (ref 0.0–0.2)

## 2021-06-15 LAB — COMPREHENSIVE METABOLIC PANEL
ALT: 27 U/L (ref 0–44)
AST: 17 U/L (ref 15–41)
Albumin: 3.5 g/dL (ref 3.5–5.0)
Alkaline Phosphatase: 78 U/L (ref 38–126)
Anion gap: 7 (ref 5–15)
BUN: 5 mg/dL — ABNORMAL LOW (ref 6–20)
CO2: 23 mmol/L (ref 22–32)
Calcium: 8.8 mg/dL — ABNORMAL LOW (ref 8.9–10.3)
Chloride: 113 mmol/L — ABNORMAL HIGH (ref 98–111)
Creatinine, Ser: 0.61 mg/dL (ref 0.44–1.00)
GFR, Estimated: >60 mL/min (ref >60)
Glucose, Bld: 126 mg/dL — ABNORMAL HIGH (ref 70–99)
Potassium: 2.4 mmol/L — CL (ref 3.5–5.1)
Sodium: 143 mmol/L (ref 135–145)
Total Bilirubin: 0.5 mg/dL (ref 0.3–1.2)
Total Protein: 7 g/dL (ref 6.5–8.1)

## 2021-06-15 LAB — MAGNESIUM: Magnesium: 1.8 mg/dL (ref 1.7–2.4)

## 2021-06-15 MED ORDER — SODIUM CHLORIDE 0.9 % IV SOLN
2400.0000 mg/m2 | INTRAVENOUS | Status: DC
  Administered 2021-06-15: 4000 mg via INTRAVENOUS
  Filled 2021-06-15: qty 80

## 2021-06-15 MED ORDER — ATROPINE SULFATE 1 MG/ML IV SOLN
0.5000 mg | Freq: Once | INTRAVENOUS | Status: AC
  Administered 2021-06-15: 0.5 mg via INTRAVENOUS
  Filled 2021-06-15: qty 1

## 2021-06-15 MED ORDER — SODIUM CHLORIDE 0.9 % IV SOLN
150.0000 mg | Freq: Once | INTRAVENOUS | Status: AC
  Administered 2021-06-15: 150 mg via INTRAVENOUS
  Filled 2021-06-15: qty 150

## 2021-06-15 MED ORDER — POTASSIUM CHLORIDE CRYS ER 20 MEQ PO TBCR
40.0000 meq | EXTENDED_RELEASE_TABLET | Freq: Once | ORAL | Status: AC
  Administered 2021-06-15: 40 meq via ORAL
  Filled 2021-06-15: qty 2

## 2021-06-15 MED ORDER — SODIUM CHLORIDE 0.9 % IV SOLN
10.0000 mg | Freq: Once | INTRAVENOUS | Status: AC
  Administered 2021-06-15: 10 mg via INTRAVENOUS
  Filled 2021-06-15: qty 10

## 2021-06-15 MED ORDER — LEUCOVORIN CALCIUM INJECTION 350 MG
200.0000 mg/m2 | Freq: Once | INTRAVENOUS | Status: AC
  Administered 2021-06-15: 332 mg via INTRAVENOUS
  Filled 2021-06-15: qty 16.6

## 2021-06-15 MED ORDER — SODIUM CHLORIDE 0.9 % IV SOLN
165.0000 mg/m2 | Freq: Once | INTRAVENOUS | Status: AC
  Administered 2021-06-15: 280 mg via INTRAVENOUS
  Filled 2021-06-15: qty 10

## 2021-06-15 MED ORDER — PALONOSETRON HCL INJECTION 0.25 MG/5ML
0.2500 mg | Freq: Once | INTRAVENOUS | Status: AC
  Administered 2021-06-15: 0.25 mg via INTRAVENOUS
  Filled 2021-06-15: qty 5

## 2021-06-15 MED ORDER — POTASSIUM CHLORIDE 10 MEQ/100ML IV SOLN
10.0000 meq | INTRAVENOUS | Status: AC
  Administered 2021-06-15 (×2): 10 meq via INTRAVENOUS
  Filled 2021-06-15 (×2): qty 100

## 2021-06-15 MED ORDER — SODIUM CHLORIDE 0.9 % IV SOLN: Freq: Once | INTRAVENOUS | Status: AC

## 2021-06-15 MED ORDER — SODIUM CHLORIDE 0.9 % IV SOLN
6.2000 mg/kg | Freq: Once | INTRAVENOUS | Status: AC
  Administered 2021-06-15: 400 mg via INTRAVENOUS
  Filled 2021-06-15: qty 20

## 2021-06-15 MED ORDER — DEXTROSE 5 % IV SOLN: Freq: Once | INTRAVENOUS | Status: AC

## 2021-06-15 MED ORDER — OXALIPLATIN CHEMO INJECTION 100 MG/20ML
85.0000 mg/m2 | Freq: Once | INTRAVENOUS | Status: AC
  Administered 2021-06-15: 140 mg via INTRAVENOUS
  Filled 2021-06-15: qty 20

## 2021-06-15 NOTE — Patient Instructions (Addendum)
[facility]  Discharge Instructions: ?Thank you for choosing [facility] to provide your oncology and hematology care.  ?If you have a lab appointment with the [facility], please come in thru the Main Entrance and check in at the main information desk. ? ?Wear comfortable clothing and clothing appropriate for easy access to any Portacath or PICC line.  ? ?We strive to give you quality time with your provider. You may need to reschedule your appointment if you arrive late (15 or more minutes).  Arriving late affects you and other patients whose appointments are after yours.  Also, if you miss three or more appointments without notifying the office, you may be dismissed from the clinic at the provider?s discretion.    ?  ?For prescription refill requests, have your pharmacy contact our office and allow 72 hours for refills to be completed.   ? ?Today you received the following chemotherapy and/or immunotherapy agents Vecitbix and Folfoxiri ?  ?To help prevent nausea and vomiting after your treatment, we encourage you to take your nausea medication as directed. ? The chemotherapy medication bag should finish at 46 hours, 96 hours, or 7 days. For example, if your pump is scheduled for 46 hours and it was put on at 4:00 p.m., it should finish at 2:00 p.m. the day it is scheduled to come off regardless of your appointment time.   ?  ?Estimated time to finish at 1500. ?  ?If the display on your pump reads "Low Volume" and it is beeping, take the batteries out of the pump and come to the cancer center for it to be taken off.  ? ?If the pump alarms go off prior to the pump reading "Low Volume" then call [phone] and someone can assist you. ? ?If the plunger comes out and the chemotherapy medication is leaking out, please use your home chemo spill kit to clean up the spill. Do NOT use paper towels or other household products. ? ?If you have problems or questions regarding your pump, please  call either [phone] (24 hours a day) or the cancer center Monday-Friday 8:00 a.m.- 4:30 p.m. at the clinic number and we will assist you. If you are unable to get assistance, then go to the nearest Emergency Department and ask the staff to contact the IV team for assistance.   ?BELOW ARE SYMPTOMS THAT SHOULD BE REPORTED IMMEDIATELY: ?*FEVER GREATER THAN 100.4 F (38 ?C) OR HIGHER ?*CHILLS OR SWEATING ?*NAUSEA AND VOMITING THAT IS NOT CONTROLLED WITH YOUR NAUSEA MEDICATION ?*UNUSUAL SHORTNESS OF BREATH ?*UNUSUAL BRUISING OR BLEEDING ?*URINARY PROBLEMS (pain or burning when urinating, or frequent urination) ?*BOWEL PROBLEMS (unusual diarrhea, constipation, pain near the anus) ?TENDERNESS IN MOUTH AND THROAT WITH OR WITHOUT PRESENCE OF ULCERS (sore throat, sores in mouth, or a toothache) ?UNUSUAL RASH, SWELLING OR PAIN  ?UNUSUAL VAGINAL DISCHARGE OR ITCHING  ? ?Items with * indicate a potential emergency and should be followed up as soon as possible or go to the Emergency Department if any problems should occur. ? ?Please show the CHEMOTHERAPY ALERT CARD or IMMUNOTHERAPY ALERT CARD at check-in to the Emergency Department and triage nurse. ? ?Should you have questions after your visit or need to cancel or reschedule your appointment, please contact [facility] [phone]  and follow the prompts.  Office hours are 8:00 a.m. to 4:30 p.m. Monday - Friday. Please note that voicemails left after 4:00 p.m. may not be returned until the following business day.  We are closed weekends and major holidays. You have access to a nurse at all times for urgent questions. Please call the main number to the clinic [phone] and follow the prompts. ? ?For any non-urgent questions, you may also contact your provider using MyChart. We now offer e-Visits for anyone 20 and older to request care online for non-urgent symptoms. For details visit [email or web address]. ?  ?Also download the MyChart app! Go to the app  store, search "MyChart", open the app, select [facility], and log in with your MyChart username and password. ? ?Due to Covid, a mask is required upon entering the hospital/clinic. If you do not have a mask, one will be given to you upon arrival. For doctor visits, patients may have 1 support person aged 67 or older with them. For treatment visits, patients cannot have anyone with them due to current Covid guidelines and our immunocompromised population. [facility]  Discharge Instructions: ?Thank you for choosing [facility] to provide your oncology and hematology care.  ?If you have a lab appointment with the [facility], please come in thru the Main Entrance and check in at the main information desk. ? ?Wear comfortable clothing and clothing appropriate for easy access to any Portacath or PICC line.  ? ?We strive to give you quality time with your provider. You may need to reschedule your appointment if you arrive late (15 or more minutes).  Arriving late affects you and other patients whose appointments are after yours.  Also, if you miss three or more appointments without notifying the office, you may be dismissed from the clinic at the provider?s discretion.    ?  ?For prescription refill requests, have your pharmacy contact our office and allow 72 hours for refills to be completed.   ? ? ?For any non-urgent questions, you may also contact your provider using MyChart. We now offer e-Visits for anyone 43 and older to request care online for non-urgent symptoms. For details visit [email or web address]. ?  ?Also download the MyChart app! Go to the app store, search "MyChart", open the app, select [facility], and log in with your MyChart username and password. ? ?Due to Covid, a mask is required upon entering the hospital/clinic. If you do not have a mask, one will be given to you upon arrival. For doctor visits, patients may have 1 support person aged 22 or older with them. For  treatment visits, patients cannot have anyone with them due to current Covid guidelines and our immunocompromised population.  ?

## 2021-06-15 NOTE — Progress Notes (Signed)
Chaplain engaged in an initial visit with Diana Fox.  Chaplain learned of the many things Diana Fox has been facing outside of her health.  Diana Fox was candid and truthful about the anger and rage she has felt.  Chaplain asked Diana Fox about the ways she has been able to combat that anger and she talked about leaning on her faith and her community to help her.  Chaplain also talked with Diana Fox about utilizing counseling/therapy resources as well.  Chaplain provided he with a website/resource that will be helpful.   ? ?Diana Fox has been utilizing her church community for help and creating strict boundaries around her space and home to protect herself.   ? ?Chaplain affirmed the ways Diana Fox has been taking care of herself and choosing to move forward despite the weight of her emotions.  Chaplain offered support, listening, presence, and prayer with Diana Fox.  ? ? ? 06/15/21 1000  ?Clinical Encounter Type  ?Visited With Patient  ?Visit Type Initial;Spiritual support  ?Spiritual Encounters  ?Spiritual Needs Prayer  ? ? ?

## 2021-06-15 NOTE — Progress Notes (Signed)
Patients port flushed without difficulty.  Good blood return noted with no bruising or swelling noted at site.  Stable during access and blood draw.  Patient to remain accessed for treatment. 

## 2021-06-15 NOTE — Progress Notes (Signed)
Pt presents today for Folfoxiri/Vectibix per provider's order. Labs and vitals WNL for treatment today. Okay to proceed with treatment today. Pt's potassium is 2.4 today. Dr.K's standing will be give today. ? ?Folfoxiri/Vectibix , 81mq potassium chloride IV and 465m  potassium chloride p.o x 2 doses given today per MD orders. Tolerated infusion without adverse affects. Vital signs stable. No complaints at this time. Discharged from clinic ambulatory in stable condition. Alert and oriented x 3. [facility] with [facility] scheduled. 5FU ambulatory pump infusing. ?

## 2021-06-15 NOTE — Progress Notes (Signed)
CRITICAL VALUE ALERT ?Critical value received:  Potassium 2.4 ?Date of notification:  06-15-2021 ?Time of notification: 09:05 am.  ?Critical value read back:  Yes.   ?Nurse who received alert:  B. Doha Boling RN.  ?MD notified time and response:  R. Pennington PA, 40 mEq of PO potassium, 20 mEq IV potassium, 40 mEq PO potassium per standing order for Dr. Delton Coombes.   ?

## 2021-06-16 ENCOUNTER — Other Ambulatory Visit (HOSPITAL_COMMUNITY): Payer: Self-pay | Admitting: Physician Assistant

## 2021-06-16 ENCOUNTER — Telehealth (HOSPITAL_COMMUNITY): Payer: Self-pay | Admitting: Hematology

## 2021-06-16 DIAGNOSIS — E876 Hypokalemia: Secondary | ICD-10-CM

## 2021-06-16 LAB — CEA: CEA: 31.8 ng/mL — ABNORMAL HIGH (ref 0.0–4.7)

## 2021-06-17 ENCOUNTER — Inpatient Hospital Stay (HOSPITAL_COMMUNITY): Payer: Medicaid Other

## 2021-06-17 ENCOUNTER — Inpatient Hospital Stay: Payer: Medicaid Other | Attending: Dietician | Admitting: Dietician

## 2021-06-17 ENCOUNTER — Telehealth: Payer: Self-pay | Admitting: Dietician

## 2021-06-17 VITALS — BP 112/78 | HR 81 | Temp 97.8°F | Resp 18

## 2021-06-17 DIAGNOSIS — Z5111 Encounter for antineoplastic chemotherapy: Secondary | ICD-10-CM | POA: Diagnosis not present

## 2021-06-17 DIAGNOSIS — C189 Malignant neoplasm of colon, unspecified: Secondary | ICD-10-CM

## 2021-06-17 DIAGNOSIS — Z95828 Presence of other vascular implants and grafts: Secondary | ICD-10-CM

## 2021-06-17 MED ORDER — PEGFILGRASTIM-CBQV 6 MG/0.6ML ~~LOC~~ SOSY
6.0000 mg | PREFILLED_SYRINGE | Freq: Once | SUBCUTANEOUS | Status: AC
  Administered 2021-06-17: 6 mg via SUBCUTANEOUS
  Filled 2021-06-17: qty 0.6

## 2021-06-17 MED ORDER — HEPARIN SOD (PORK) LOCK FLUSH 100 UNIT/ML IV SOLN
500.0000 [IU] | Freq: Once | INTRAVENOUS | Status: AC | PRN
  Administered 2021-06-17: 500 [IU]

## 2021-06-17 MED ORDER — SODIUM CHLORIDE 0.9% FLUSH
10.0000 mL | INTRAVENOUS | Status: DC | PRN
  Administered 2021-06-17: 10 mL

## 2021-06-17 NOTE — Patient Instructions (Signed)
[facility]  Discharge Instructions: ?Thank you for choosing [facility] to provide your oncology and hematology care.  ?If you have a lab appointment with the [facility], please come in thru the Main Entrance and check in at the main information desk. ? ?Wear comfortable clothing and clothing appropriate for easy access to any Portacath or PICC line.  ? ?We strive to give you quality time with your provider. You may need to reschedule your appointment if you arrive late (15 or more minutes).  Arriving late affects you and other patients whose appointments are after yours.  Also, if you miss three or more appointments without notifying the office, you may be dismissed from the clinic at the provider?s discretion.    ?  ?For prescription refill requests, have your pharmacy contact our office and allow 72 hours for refills to be completed.   ? ?Today you received the following Udeyca injection and chemotherapy pump d/c ? ? ?BELOW ARE SYMPTOMS THAT SHOULD BE REPORTED IMMEDIATELY: ?*FEVER GREATER THAN 100.4 F (38 ?C) OR HIGHER ?*CHILLS OR SWEATING ?*NAUSEA AND VOMITING THAT IS NOT CONTROLLED WITH YOUR NAUSEA MEDICATION ?*UNUSUAL SHORTNESS OF BREATH ?*UNUSUAL BRUISING OR BLEEDING ?*URINARY PROBLEMS (pain or burning when urinating, or frequent urination) ?*BOWEL PROBLEMS (unusual diarrhea, constipation, pain near the anus) ?TENDERNESS IN MOUTH AND THROAT WITH OR WITHOUT PRESENCE OF ULCERS (sore throat, sores in mouth, or a toothache) ?UNUSUAL RASH, SWELLING OR PAIN  ?UNUSUAL VAGINAL DISCHARGE OR ITCHING  ? ?Items with * indicate a potential emergency and should be followed up as soon as possible or go to the Emergency Department if any problems should occur. ? ?Please show the CHEMOTHERAPY ALERT CARD or IMMUNOTHERAPY ALERT CARD at check-in to the Emergency Department and triage nurse. ? ?Should you have questions after your visit or need to cancel or reschedule your appointment, please  contact [facility] [phone]  and follow the prompts.  Office hours are 8:00 a.m. to 4:30 p.m. Monday - Friday. Please note that voicemails left after 4:00 p.m. may not be returned until the following business day.  We are closed weekends and major holidays. You have access to a nurse at all times for urgent questions. Please call the main number to the clinic [phone] and follow the prompts. ? ?For any non-urgent questions, you may also contact your provider using MyChart. We now offer e-Visits for anyone 99 and older to request care online for non-urgent symptoms. For details visit [email or web address]. ?  ?Also download the MyChart app! Go to the app store, search "MyChart", open the app, select [facility], and log in with your MyChart username and password. ? ?Due to Covid, a mask is required upon entering the hospital/clinic. If you do not have a mask, one will be given to you upon arrival. For doctor visits, patients may have 1 support person aged 31 or older with them. For treatment visits, patients cannot have anyone with them due to current Covid guidelines and our immunocompromised population.  ?

## 2021-06-17 NOTE — Progress Notes (Signed)
Pt presents today for 5FU chemotherapy pump disconnection and Udenyca injection per provider's order. Vital signs stable and pt voiced no new complaints at this time. Port flush easily without difficulty with 24m of NS and 590mof heparin. Good blood return noted and no bruising or swelling noted at the site. ? ?Discharged from clinic ambulatory in stable condition. Alert and oriented x 3. F/U with [facility] scheduled.   ?

## 2021-06-17 NOTE — Telephone Encounter (Signed)
Nutrition Assessment ? ? ?Reason for Assessment: provider referred  ? ? 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 S/p sigmoid colon resection, partial small bowel resection and Hartman's procedure on 03/26/2021. She is currently receiving FOLFOXIRI + Panitumumab q14d. Patient is under the care of Dr. Delton Coombes.  ? ?Past medical history includes IDA, HTN, colitis, small bowel fistula, hypokalemia ? ?Received return call from patient. She reports tolerating treatment well. Patient endorses mild cold sensitivity lasting ~3 days, denies nausea, vomiting. Patient reports output is formed, emptying bag 2-3 times day. She will be glad when able to get rid of it. Patient reports feeling slightly shaky today which she relates to pump. This will be disconnected today. She reports appetite is good and tolerating regular diet. Patient recalls eating 2-3 meals, snacks, and drinking 2 Boost Plus. Patient drinks a Boost for breakfast, recalls sandwiches with chips for lunch, snacks on chips/dip, yogurt, cereal, and drinks another Boost in the afternoon. Patient reports variety of foods for dinner (pizza, spaghetti, chicken, beans, corn, pintos, mac/cheese). Patient reports drinking water all day long. She enjoys lemonade as well as peach soda occasionally.  ? ?Nutrition Focused Physical Exam: Unable to complete at this time ? ? ?Medications: ferrous sulfate, norco, klor-con, mag-ox, megace, MVI, zofran, protonix, compazine, carafate ? ? ?Labs: 5/9 - glucose 126, BUN 5, K 2.4 (s/p potassium chloride 20 mEq IV, 40 mEq potassium chloride po x 2 doses)  ? ? ?Anthropometrics:  ? ?Height: 5'2.4" ?Weight: 133 lb 6.4 oz (5/09) ?UBW: 178 lb 4.8 oz (01/07/21) ?BMI: 24.09 ? ? ?NUTRITION DIAGNOSIS: Unintentional weight loss related to stage IV colon cancer as evidenced by 25% (45 lb) decrease from usual weight in 5 months. This is significant for time frame. Given this, highly suspect degree of  malnutrition however unable to diagnose at this time. Will plan to complete NFPE at follow-up as able.  ? ? ?INTERVENTION:  ?Educated on importance of adequate calories and protein to maintain weight/strength ?Discussed foods with protein, recommend including protein source with meals/snacks - will mail handout with snack ideas ?Continue drinking 2 Boost Plus/equivalent ?Will provide one complimentary case of Ensure Plus High Protein (message sent to navigator to provide this at appointment today) ?Contact information given  ? ? ?MONITORING, EVALUATION, GOAL: Patient will tolerate increased calories and protein to promote weight gain ? ? ?Next Visit: To be scheduled  ? ? ? ? ? ? ?

## 2021-06-18 ENCOUNTER — Ambulatory Visit (HOSPITAL_COMMUNITY): Payer: Medicaid Other

## 2021-06-18 ENCOUNTER — Encounter: Payer: Self-pay | Admitting: Internal Medicine

## 2021-06-28 ENCOUNTER — Telehealth: Payer: Self-pay | Admitting: *Deleted

## 2021-06-28 ENCOUNTER — Telehealth (HOSPITAL_COMMUNITY): Payer: Self-pay | Admitting: Hematology

## 2021-06-28 NOTE — Telephone Encounter (Signed)
Pt called back.  Can be reached at [phone].

## 2021-06-28 NOTE — Telephone Encounter (Signed)
Spoke to pt, she stated that she has had brown, slimy, mucous coming from her rectum. She states that she still has bowel ment in her colostomy bag. States this has happened before and that she has pressure, like she needs to have a bowel ment.

## 2021-06-28 NOTE — Telephone Encounter (Signed)
Tried calling patient to discuss further as we have not see patient since her colonoscopy in January 2023 where she was found to have adenocarcinoma of the sigmoid colon. [facility] requesting return call.

## 2021-06-28 NOTE — Telephone Encounter (Signed)
Spoke with patient. She is passing small amounts of brown mucous per rectum. Not passing any stool per rectum. Denies rectal pain or pressure, brbpr, fever, abdominal pain. Colostomy is functioning properly with normal output.   She has history of sigmoid adenocarcinoma and underwent partial colectomy with hartmann procedure in February 2023. I suspect she has likely had a build up of normal mucous production in her rectum that is passing. She has no alarm symptoms. She is already scheduled for a follow-up next week and we can evaluate further at that time if persistent symptoms. Regardless, she will need to be scheduled for a complete colonosocpy since her colonoscopy was incomplete at the time of colon cancer diagnosis.

## 2021-06-28 NOTE — Telephone Encounter (Signed)
Commitment Pledge made to [employer] in the amount of $315.  [facility] REF# [id number]

## 2021-06-29 ENCOUNTER — Inpatient Hospital Stay (HOSPITAL_BASED_OUTPATIENT_CLINIC_OR_DEPARTMENT_OTHER): Payer: Medicaid Other | Admitting: Hematology

## 2021-06-29 ENCOUNTER — Inpatient Hospital Stay (HOSPITAL_COMMUNITY): Payer: Medicaid Other

## 2021-06-29 VITALS — BP 135/84 | HR 96 | Temp 97.9°F | Resp 18

## 2021-06-29 VITALS — BP 115/84 | HR 97 | Temp 97.5°F | Resp 16 | Wt 125.8 lb

## 2021-06-29 DIAGNOSIS — C189 Malignant neoplasm of colon, unspecified: Secondary | ICD-10-CM

## 2021-06-29 DIAGNOSIS — E876 Hypokalemia: Secondary | ICD-10-CM | POA: Diagnosis not present

## 2021-06-29 DIAGNOSIS — Z5111 Encounter for antineoplastic chemotherapy: Secondary | ICD-10-CM | POA: Diagnosis not present

## 2021-06-29 DIAGNOSIS — Z95828 Presence of other vascular implants and grafts: Secondary | ICD-10-CM

## 2021-06-29 LAB — COMPREHENSIVE METABOLIC PANEL
ALT: 70 U/L — ABNORMAL HIGH (ref 0–44)
AST: 30 U/L (ref 15–41)
Albumin: 4.1 g/dL (ref 3.5–5.0)
Alkaline Phosphatase: 81 U/L (ref 38–126)
Anion gap: 7 (ref 5–15)
BUN: 8 mg/dL (ref 6–20)
CO2: 20 mmol/L — ABNORMAL LOW (ref 22–32)
Calcium: 8.8 mg/dL — ABNORMAL LOW (ref 8.9–10.3)
Chloride: 111 mmol/L (ref 98–111)
Creatinine, Ser: 0.75 mg/dL (ref 0.44–1.00)
GFR, Estimated: >60 mL/min (ref >60)
Glucose, Bld: 110 mg/dL — ABNORMAL HIGH (ref 70–99)
Potassium: 2.8 mmol/L — ABNORMAL LOW (ref 3.5–5.1)
Sodium: 138 mmol/L (ref 135–145)
Total Bilirubin: <0.1 mg/dL — ABNORMAL LOW (ref 0.3–1.2)
Total Protein: 7.6 g/dL (ref 6.5–8.1)

## 2021-06-29 LAB — CBC WITH DIFFERENTIAL/PLATELET
Abs Immature Granulocytes: 0.12 10*3/uL — ABNORMAL HIGH (ref 0.00–0.07)
Basophils Absolute: 0 10*3/uL (ref 0.0–0.1)
Basophils Relative: 1 %
Eosinophils Absolute: 0.2 10*3/uL (ref 0.0–0.5)
Eosinophils Relative: 2 %
HCT: 36.3 % (ref 36.0–46.0)
Hemoglobin: 11.8 g/dL — ABNORMAL LOW (ref 12.0–15.0)
Immature Granulocytes: 2 %
Lymphocytes Relative: 46 %
Lymphs Abs: 3.7 10*3/uL (ref 0.7–4.0)
MCH: 26.6 pg (ref 26.0–34.0)
MCHC: 32.5 g/dL (ref 30.0–36.0)
MCV: 81.9 fL (ref 80.0–100.0)
Monocytes Absolute: 0.7 10*3/uL (ref 0.1–1.0)
Monocytes Relative: 9 %
Neutro Abs: 3.1 10*3/uL (ref 1.7–7.7)
Neutrophils Relative %: 40 %
Platelets: 318 10*3/uL (ref 150–400)
RBC: 4.43 MIL/uL (ref 3.87–5.11)
RDW: 21.8 % — ABNORMAL HIGH (ref 11.5–15.5)
WBC: 7.8 10*3/uL (ref 4.0–10.5)
nRBC: 0 % (ref 0.0–0.2)

## 2021-06-29 LAB — MAGNESIUM: Magnesium: 1.9 mg/dL (ref 1.7–2.4)

## 2021-06-29 MED ORDER — SODIUM CHLORIDE 0.9 % IV SOLN
2400.0000 mg/m2 | INTRAVENOUS | Status: DC
  Administered 2021-06-29: 4000 mg via INTRAVENOUS
  Filled 2021-06-29: qty 80

## 2021-06-29 MED ORDER — HEPARIN SOD (PORK) LOCK FLUSH 100 UNIT/ML IV SOLN: 500.0000 [IU] | Freq: Once | INTRAVENOUS | Status: DC | PRN

## 2021-06-29 MED ORDER — SODIUM CHLORIDE 0.9 % IV SOLN
6.0000 mg/kg | Freq: Once | INTRAVENOUS | Status: AC
  Administered 2021-06-29: 360 mg via INTRAVENOUS
  Filled 2021-06-29: qty 18

## 2021-06-29 MED ORDER — ATROPINE SULFATE 1 MG/ML IV SOLN
0.5000 mg | Freq: Once | INTRAVENOUS | Status: AC
  Administered 2021-06-29: 0.5 mg via INTRAVENOUS
  Filled 2021-06-29: qty 1

## 2021-06-29 MED ORDER — SODIUM CHLORIDE 0.9 % IV SOLN
10.0000 mg | Freq: Once | INTRAVENOUS | Status: AC
  Administered 2021-06-29: 10 mg via INTRAVENOUS
  Filled 2021-06-29: qty 10

## 2021-06-29 MED ORDER — LEUCOVORIN CALCIUM INJECTION 350 MG
200.0000 mg/m2 | Freq: Once | INTRAVENOUS | Status: AC
  Administered 2021-06-29: 332 mg via INTRAVENOUS
  Filled 2021-06-29: qty 16.6

## 2021-06-29 MED ORDER — HYDROCORTISONE 1 % EX LOTN: 1.0000 "application " | TOPICAL_LOTION | Freq: Two times a day (BID) | CUTANEOUS | 5 refills | Status: DC

## 2021-06-29 MED ORDER — SODIUM CHLORIDE 0.9 % IV SOLN
165.0000 mg/m2 | Freq: Once | INTRAVENOUS | Status: AC
  Administered 2021-06-29: 280 mg via INTRAVENOUS
  Filled 2021-06-29: qty 10

## 2021-06-29 MED ORDER — SODIUM CHLORIDE 0.9 % IV SOLN: INTRAVENOUS | Status: DC

## 2021-06-29 MED ORDER — SUCRALFATE 1 GM/10ML PO SUSP
1.0000 g | Freq: Four times a day (QID) | ORAL | 6 refills | Status: DC | PRN
Start: 2021-06-29 — End: 2022-04-14

## 2021-06-29 MED ORDER — DOXYCYCLINE HYCLATE 100 MG PO TABS: 100.0000 mg | ORAL_TABLET | Freq: Two times a day (BID) | ORAL | 3 refills | Status: DC

## 2021-06-29 MED ORDER — OXALIPLATIN CHEMO INJECTION 100 MG/20ML
85.0000 mg/m2 | Freq: Once | INTRAVENOUS | Status: AC
  Administered 2021-06-29: 140 mg via INTRAVENOUS
  Filled 2021-06-29: qty 10

## 2021-06-29 MED ORDER — SODIUM CHLORIDE 0.9% FLUSH: 10.0000 mL | INTRAVENOUS | Status: DC | PRN

## 2021-06-29 MED ORDER — DEXTROSE 5 % IV SOLN: Freq: Once | INTRAVENOUS | Status: AC

## 2021-06-29 MED ORDER — POTASSIUM CHLORIDE CRYS ER 20 MEQ PO TBCR
40.0000 meq | EXTENDED_RELEASE_TABLET | Freq: Once | ORAL | Status: AC
  Administered 2021-06-29: 40 meq via ORAL
  Filled 2021-06-29: qty 2

## 2021-06-29 MED ORDER — PALONOSETRON HCL INJECTION 0.25 MG/5ML
0.2500 mg | Freq: Once | INTRAVENOUS | Status: AC
  Administered 2021-06-29: 0.25 mg via INTRAVENOUS
  Filled 2021-06-29: qty 5

## 2021-06-29 MED ORDER — SODIUM CHLORIDE 0.9 % IV SOLN
150.0000 mg | Freq: Once | INTRAVENOUS | Status: AC
  Administered 2021-06-29: 150 mg via INTRAVENOUS
  Filled 2021-06-29: qty 5

## 2021-06-29 NOTE — Progress Notes (Signed)
[place], [place] [address]   CLINIC:  Medical Oncology/Hematology  PCP:  Caryl Bis, MD [address] 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  PRIOR THERAPY: Sigmoid colon resection, partial small bowel resection and Hartman's procedure on 03/26/2021  NGS Results: KRAS/NRAS negative, BRAF negative, HER2 negative, MSI-stable  CURRENT THERAPY: FOLFOXIRI + Panitumumab q14d  BRIEF ONCOLOGIC HISTORY:  Oncology History  Colon carcinoma ([facility])  03/26/2021 Initial Diagnosis   Colon carcinoma ([facility])    04/06/2021 Cancer Staging   Staging form: Colon and Rectum, AJCC 8th Edition - Clinical stage from 04/06/2021: Stage IVC (cT4a, cN2b, pM1c) - Signed by Derek Jack, MD on 05/04/2021 Histopathologic type: Adenocarcinoma, NOS Stage prefix: Initial diagnosis Total positive nodes: 9 Total nodes examined: 9 Tumor deposits (TD): Present Carcinoembryonic antigen (CEA) (ng/mL): 61.5 Perineural invasion (PNI): Present Microsatellite instability (MSI): Stable KRAS mutation: Negative NRAS mutation: Negative BRAF mutation: Negative    05/17/2021 -  Chemotherapy   Patient is on Treatment Plan : COLORECTAL FOLFOXIRI + Panitumumab q14d        CANCER STAGING:  Cancer Staging  Colon carcinoma [facility]) Staging form: Colon and Rectum, AJCC 8th Edition - Clinical stage from 04/06/2021: Stage IVC (cT4a, cN2b, pM1c) - Signed by Derek Jack, MD on 05/04/2021   INTERVAL HISTORY:  [name], a 51 y.o. female, returns for routine follow-up and consideration for next cycle of chemotherapy. Diana Fox was last seen on 06/01/2021.  Due for cycle #4 of FOLFOXIRI + Panitumumab today.   Overall, she tells me she has been feeling pretty well. She reports recurrent mouth sores in the first week after treatment. She also reports recurrent episode of  rashes appearing on her face for which she is taking doxycycline which will help temporarily before she will break out again. She denies abdominal cramping and diarrhea. Her energy is good.   Overall, she feels ready for next cycle of chemo today.    REVIEW OF SYSTEMS:  Review of Systems  Constitutional:  Negative for appetite change and fatigue.  HENT:   Positive for mouth sores.   Gastrointestinal:  Negative for abdominal pain and diarrhea.  All other systems reviewed and are negative.  PAST MEDICAL/SURGICAL HISTORY:  Past Medical History:  Diagnosis Date   Anemia    Back pain    Hypertension    Port-A-Cath in place 05/10/2021   Past Surgical History:  Procedure Laterality Date   BIOPSY  03/02/2021   Procedure: BIOPSY;  Surgeon: Eloise Harman, DO;  [facility];  Service: Endoscopy;;   BOWEL RESECTION N/A 03/26/2021   Procedure: SMALL BOWEL RESECTION;  Surgeon: Aviva Signs, MD;  Location: [facility] ORS;  Service: General;  Laterality: N/A;   CHOLECYSTECTOMY     COLOSTOMY N/A 03/26/2021   Procedure: COLOSTOMY;  Surgeon: Aviva Signs, MD;  Location: [facility] ORS;  Service: General;  Laterality: N/A;   ESOPHAGOGASTRODUODENOSCOPY (EGD) WITH PROPOFOL N/A 03/02/2021   Procedure: ESOPHAGOGASTRODUODENOSCOPY (EGD) WITH PROPOFOL;  Surgeon: Eloise Harman, DO;  Location: [facility];  Service: Endoscopy;  Laterality: N/A;   PARTIAL COLECTOMY N/A 03/26/2021   Procedure: PARTIAL COLECTOMY;  Surgeon: Aviva Signs, MD;  Location: [facility] ORS;  Service: General;  Laterality: N/A;   PORTACATH PLACEMENT Left 04/14/2021   Procedure: INSERTION PORT-A-CATH;  Surgeon: Aviva Signs, MD;  Location: [facility] OR[facility];  Service: General;  Laterality: Left;  SCLEROTHERAPY  03/02/2021   Procedure: SCLEROTHERAPY;  Surgeon: Eloise Harman, DO;  Location: [facility];  Service: Endoscopy;;   SIGMOIDOSCOPY  03/02/2021   Procedure: SIGMOIDOSCOPY;  Surgeon: Eloise Harman, DO;  [facility];  Service:  Endoscopy;;    SOCIAL HISTORY:  Social History   Socioeconomic History   Marital status: Married    Spouse name: Not on file   Number of children: Not on file   Years of education: Not on file   Highest education level: Not on file  Occupational History   Not on file  Tobacco Use   Smoking status: Never   Smokeless tobacco: Never  Vaping Use   Vaping Use: Never used  Substance and Sexual Activity   Alcohol use: No   Drug use: No   Sexual activity: Not on file  Other Topics Concern   Not on file  Social History Narrative   Not on file   Social Determinants of Health   Financial Resource Strain: High Risk   Difficulty of Paying Living Expenses: Hard  Food Insecurity: Not on file  Transportation Needs: Not on file  Physical Activity: Not on file  Stress: Not on file  Social Connections: Not on file  Intimate Partner Violence: Not on file    FAMILY HISTORY:  Family History  Problem Relation Age of Onset   Hypertension Mother    Hypertension Father    Colon cancer Neg Hx    Colon polyps Neg Hx    Inflammatory bowel disease Neg Hx     CURRENT MEDICATIONS:  Current Outpatient Medications  Medication Sig Dispense Refill   ferrous sulfate 325 (65 FE) MG tablet TAKE 325 MG BY MOUTH DAILY. (Patient taking differently: 325 mg daily with breakfast.) 90 tablet 1   fexofenadine (ALLEGRA) 180 MG tablet Take 1 tablet (180 mg total) by mouth daily. 30 tablet 0   fluorouracil CALGB 36629 2,400 mg/m2 in sodium chloride 0.9 % 150 mL Inject 2,400 mg/m2 into the vein over 48 hr. Every 14 days     FLUOROURACIL IV Inject into the vein every 14 (fourteen) days.     fluticasone (FLONASE) 50 MCG/ACT nasal spray Place 2 sprays into both nostrils daily. 16 g 12   HYDROcodone-acetaminophen (NORCO) 7.5-325 MG tablet Take 1 tablet by mouth every 6 (six) hours as needed for moderate pain. 28 tablet 0   KLOR-CON M20 20 MEQ tablet TAKE 3 TABLETS (60 MEQ TOTAL) BY MOUTH DAILY FOR 1 DAY, THEN 1  TABLET (20 MEQ TOTAL) DAILY. 33 tablet 0   LEUCOVORIN CALCIUM IV Inject into the vein every 14 (fourteen) days.     lidocaine (XYLOCAINE) 2 % solution Use as directed 10 mLs in the mouth or throat 4 (four) times daily as needed for mouth pain (Mix 1:1 with carafate and swish and spit). 100 mL ML   lidocaine-prilocaine (EMLA) cream Apply a small amount to port a cath site and cover with plastic wrap 1 hour prior to chemotherapy appointments 30 g 3   loratadine (CLARITIN) 10 MG tablet Take 1 tablet by mouth daily.     magnesium oxide (MAG-OX) 400 (240 Mg) MG tablet Take 1 tablet (400 mg total) by mouth 2 (two) times daily. 60 tablet 6   medroxyPROGESTERone (DEPO-PROVERA) 150 MG/ML injection Inject 150 mg into the muscle every 3 (three) months.     medroxyPROGESTERone (PROVERA) 5 MG tablet Take 5 mg by mouth every other day.     megestrol (MEGACE)  400 MG/10ML suspension Take 10 mLs (400 mg total) by mouth 2 (two) times daily. 240 mL 2   Multiple Vitamin (MULTIVITAMIN) tablet Take 1 tablet by mouth daily.     nebivolol (BYSTOLIC) 10 MG tablet Take 1 tablet (10 mg total) by mouth daily. 30 tablet 1   ondansetron (ZOFRAN) 4 MG tablet Take 1 tablet (4 mg total) by mouth every 8 (eight) hours as needed for nausea or vomiting. 20 tablet 0   OXALIPLATIN IV Inject into the vein every 14 (fourteen) days.     Panitumumab (VECTIBIX IV) Inject into the vein every 14 (fourteen) days.     pantoprazole (PROTONIX) 40 MG tablet Take 40 mg by mouth daily as needed (acid reflux).     prochlorperazine (COMPAZINE) 10 MG tablet Take 1 tablet (10 mg total) by mouth every 6 (six) hours as needed (Nausea or vomiting). 30 tablet 1   sucralfate (CARAFATE) 1 GM/10ML suspension Take 10 mLs (1 g total) by mouth 4 (four) times daily as needed. Mix 1:1 with 2% lidocaine viscous solution and swish and spit 420 mL 0   No current facility-administered medications for this visit.    ALLERGIES:  Allergies  Allergen Reactions    Azithromycin Hives    infusing arm began to swell , her face got red, and she broke out, there are bumps around where the IV site was infusing   Motrin [Ibuprofen] Itching   Lisinopril     cough   Sudafed [Pseudoephedrine Hcl] Other (See Comments)    Hypertension    PHYSICAL EXAM:  Performance status (ECOG): 1 - Symptomatic but completely ambulatory  There were no vitals filed for this visit. Wt Readings from Last 3 Encounters:  06/15/21 133 lb 6.4 oz (60.5 kg)  06/01/21 132 lb 3.2 oz (60 kg)  05/25/21 126 lb 8.7 oz (57.4 kg)   Physical Exam Vitals reviewed.  Constitutional:      Appearance: Normal appearance.  Cardiovascular:     Rate and Rhythm: Normal rate and regular rhythm.     Pulses: Normal pulses.     Heart sounds: Normal heart sounds.  Pulmonary:     Effort: Pulmonary effort is normal.     Breath sounds: Normal breath sounds.  Neurological:     General: No focal deficit present.     Mental Status: She is alert and oriented to person, place, and time.  Psychiatric:        Mood and Affect: Mood normal.        Behavior: Behavior normal.    LABORATORY DATA:  I have reviewed the labs as listed.     Latest Ref Rng & Units 06/15/2021    8:13 AM 06/02/2021    8:12 AM 06/01/2021   10:32 AM  CBC  WBC 4.0 - 10.5 K/uL 9.2   7.0   3.7    Hemoglobin 12.0 - 15.0 g/dL 11.3   10.5   10.3    Hematocrit 36.0 - 46.0 % 34.2   32.3   31.7    Platelets 150 - 400 K/uL 210   308   278        Latest Ref Rng & Units 06/15/2021    8:13 AM 06/02/2021    8:12 AM 06/01/2021   10:32 AM  CMP  Glucose 70 - 99 mg/dL 126    94    BUN 6 - 20 mg/dL 5    7    Creatinine 0.44 - 1.00 mg/dL 0.61  0.52    Sodium 135 - 145 mmol/L 143    141    Potassium 3.5 - 5.1 mmol/L 2.4   3.3   2.7    Chloride 98 - 111 mmol/L 113    113    CO2 22 - 32 mmol/L 23    22    Calcium 8.9 - 10.3 mg/dL 8.8    8.8    Total Protein 6.5 - 8.1 g/dL 7.0    7.1    Total Bilirubin 0.3 - 1.2 mg/dL 0.5    0.4    Alkaline  Phos 38 - 126 U/L 78    52    AST 15 - 41 U/L 17    12    ALT 0 - 44 U/L 27    17      DIAGNOSTIC IMAGING:  I have independently reviewed the scans and discussed with the patient. No results found.   ASSESSMENT:  PT4PN2B N1C stage IV sigmoid colon cancer, NRAS negative: - Colonoscopy on 03/02/2021: Nonbleeding internal hemorrhoids, severe stenosis in the sigmoid colon at approximately 18 cm from anal verge, unable to be traversed with pediatric colonoscopy.  Biopsy consistent with moderately to poorly differentiated adenocarcinoma. - CTAP with contrast on 01/10/2021: Diffusely thickened, 10 cm segment of distal sigmoid colon with adjacent moderate inflammatory reaction, suspected 2 sites of small bowel fistulization to the diseased segment and a rim-enhancing low-density 2.5 x 1.8 cm stricture medial to the mid segment which could represent a necrotic lymph node or contained perforation/abscess.  There are bilateral mildly enlarged common iliac chain nodes.  Mildly dilated mid to lower abdominal small bowel segments.  Mildly prominent slightly steatotic liver. - Sigmoid colon resection, partial small bowel resection and Hartman's procedure on 03/26/2021: Moderately to poorly differentiated adenocarcinoma involving the subserosal adipose tissue and present at the margin.  9/9 lymph nodes positive for metastatic carcinoma.  Small bowel resection is positive for adenocarcinoma nodules in the serosal fat.  PT4PN2B.  MMR is preserved. - Operative report mentions obvious extension of the tumor outside the colon to the pelvic brim, unable to fully excise all of the tumor in the pelvis. - NGS testing: KRAS/NRAS negative, BRAF negative, HER2 negative, MSI-stable - PET scan on 04/29/2021: Multifocal hypermetabolic nodule, peritoneal and anterior abdominal wall adenopathy.  Findings worrisome for peritoneal carcinomatosis.  No evidence of hepatic metastatic disease or distant mets. - FOLFOXIRI and Vectibix started  on 05/17/2021 - We will see how she responds and will refer to Dr. Crisoforo Oxford for resection/HIPEC therapy.    Social/family history: - She lives at home with her family.  She works as a Quarry manager at [employer].  Non-smoker. - Paternal cousin had cancer.  Maternal aunt had cancer.   PLAN:  Stage IV sigmoid colon adenocarcinoma, MSI-stable, RAS/BRAF negative: - She has tolerated 3 cycles very well. - She denies any tingling or numbness or abdominal cramping. - She reported mouth sores during the first week.  Use Carafate with lidocaine as needed. - Reviewed labs today which showed ALT elevated at 70.  CBC was normal.  Proceed with cycle 4 today and cycle 5 in 2 weeks.  RTC 4 weeks for follow-up with repeat labs.  2.  Hypokalemia: - She is taking potassium 20 mEq twice daily.  Potassium today is 2.8.  She will receive supplementation in the office today. - We will increase home potassium to 20 mEq 3 times daily.  3.  Weight loss: - Continue boost to 3  to 4 cans/day.  4.  Microcytic anemia: - Continue iron tablet daily.  Hemoglobin improved to 11.8.  5.  Acneform rash: - She has developed acne formation on the face. - Avoid direct sunlight and use sunscreen.  We will start her on doxycycline twice daily and hydrocortisone cream daily.   Orders placed this encounter:  No orders of the defined types were placed in this encounter.    Derek Jack, MD [place] [phone]   I, Thana Ates, am acting as a scribe for Dr. Derek Jack.  I, Derek Jack MD, have reviewed the above documentation for accuracy and completeness, and I agree with the above.

## 2021-06-29 NOTE — Progress Notes (Signed)
Patient has been examined by Dr. Katragadda, and vital signs and labs have been reviewed. ANC, Creatinine, LFTs, hemoglobin, and platelets are within treatment parameters per M.D. - pt may proceed with treatment.    °

## 2021-06-29 NOTE — Progress Notes (Signed)
Patient presents today for Folfoxiri/Vectibex with 5FU pump start per providers order.  Vital signs within parameters for treatment.  Labs pending.  Patient complains today of mouth sores and a rash to her face.  Folfoxiri/Vectibex with 5FU pump start given today per MD orders.  Stable during infusion without adverse affects.  Vital signs stable.  5FU pump connect , verified RUN on the screen with the patient.  No complaints at this time.  Discharge from clinic ambulatory in stable condition.  Alert and oriented X 3.  Follow up with [facility] as scheduled.

## 2021-06-29 NOTE — Patient Instructions (Signed)
[place]  Discharge Instructions: Thank you for choosing [facility] to provide your oncology and hematology care.  If you have a lab appointment with the [facility], please come in thru the Main Entrance and check in at the main information desk.  Wear comfortable clothing and clothing appropriate for easy access to any Portacath or PICC line.   We strive to give you quality time with your provider. You may need to reschedule your appointment if you arrive late (15 or more minutes).  Arriving late affects you and other patients whose appointments are after yours.  Also, if you miss three or more appointments without notifying the office, you may be dismissed from the clinic at the provider's discretion.      For prescription refill requests, have your pharmacy contact our office and allow 72 hours for refills to be completed.    Today you received the following chemotherapy and/or immunotherapy agents Folfoxiri/Vectibix 5FU pump start      To help prevent nausea and vomiting after your treatment, we encourage you to take your nausea medication as directed.  BELOW ARE SYMPTOMS THAT SHOULD BE REPORTED IMMEDIATELY: *FEVER GREATER THAN 100.4 F (38 C) OR HIGHER *CHILLS OR SWEATING *NAUSEA AND VOMITING THAT IS NOT CONTROLLED WITH YOUR NAUSEA MEDICATION *UNUSUAL SHORTNESS OF BREATH *UNUSUAL BRUISING OR BLEEDING *URINARY PROBLEMS (pain or burning when urinating, or frequent urination) *BOWEL PROBLEMS (unusual diarrhea, constipation, pain near the anus) TENDERNESS IN MOUTH AND THROAT WITH OR WITHOUT PRESENCE OF ULCERS (sore throat, sores in mouth, or a toothache) UNUSUAL RASH, SWELLING OR PAIN  UNUSUAL VAGINAL DISCHARGE OR ITCHING   Items with * indicate a potential emergency and should be followed up as soon as possible or go to the Emergency Department if any problems should occur.  Please show the CHEMOTHERAPY ALERT CARD or IMMUNOTHERAPY ALERT CARD at check-in  to the Emergency Department and triage nurse.  Should you have questions after your visit or need to cancel or reschedule your appointment, please contact [facility] [phone]  and follow the prompts.  Office hours are 8:00 a.m. to 4:30 p.m. Monday - Friday. Please note that voicemails left after 4:00 p.m. may not be returned until the following business day.  We are closed weekends and major holidays. You have access to a nurse at all times for urgent questions. Please call the main number to the clinic [phone] and follow the prompts.  For any non-urgent questions, you may also contact your provider using MyChart. We now offer e-Visits for anyone 91 and older to request care online for non-urgent symptoms. For details visit [email or web address].   Also download the MyChart app! Go to the app store, search "MyChart", open the app, select [facility], and log in with your MyChart username and password.  Due to Covid, a mask is required upon entering the hospital/clinic. If you do not have a mask, one will be given to you upon arrival. For doctor visits, patients may have 1 support person aged 71 or older with them. For treatment visits, patients cannot have anyone with them due to current Covid guidelines and our immunocompromised population.

## 2021-06-29 NOTE — Patient Instructions (Addendum)
[place] at [facility] Discharge Instructions   You were seen and examined today by Dr. Delton Coombes.  He reviewed the results of your lab work. All results are normal/stable except for your potassium. It is still low. We will give you potassium pills in the clinic today. At home, increase potassium to one pill three times a day.  We sent in an antibiotic and a lotion for your face rash - use as directed.   We will proceed with your treatment today.   Return as scheduled.    Thank you for choosing [place] at [facility] to provide your oncology and hematology care.  To afford each patient quality time with our provider, please arrive at least 15 minutes before your scheduled appointment time.   If you have a lab appointment with the [facility] please come in thru the Main Entrance and check in at the main information desk.  You need to re-schedule your appointment should you arrive 10 or more minutes late.  We strive to give you quality time with our providers, and arriving late affects you and other patients whose appointments are after yours.  Also, if you no show three or more times for appointments you may be dismissed from the clinic at the providers discretion.     Again, thank you for choosing [facility].  Our hope is that these requests will decrease the amount of time that you wait before being seen by our physicians.       _____________________________________________________________  Should you have questions after your visit to [facility], please contact our office at [phone] and follow the prompts.  Our office hours are 8:00 a.m. and 4:30 p.m. Monday - Friday.  Please note that voicemails left after 4:00 p.m. may not be returned until the following business day.  We are closed weekends and major holidays.  You do have access to a nurse 24-7, just call the main number to the clinic [phone]  and do not press any options, hold on the line and a nurse will answer the phone.    For prescription refill requests, have your pharmacy contact our office and allow 72 hours.    Due to Covid, you will need to wear a mask upon entering the hospital. If you do not have a mask, a mask will be given to you at the Main Entrance upon arrival. For doctor visits, patients may have 1 support person age 28 or older with them. For treatment visits, patients can not have anyone with them due to social distancing guidelines and our immunocompromised population.

## 2021-06-30 ENCOUNTER — Other Ambulatory Visit (HOSPITAL_COMMUNITY): Payer: Self-pay | Admitting: Physician Assistant

## 2021-06-30 DIAGNOSIS — E876 Hypokalemia: Secondary | ICD-10-CM

## 2021-07-01 ENCOUNTER — Inpatient Hospital Stay (HOSPITAL_COMMUNITY): Payer: Medicaid Other

## 2021-07-01 VITALS — BP 115/75 | HR 72 | Temp 98.2°F | Resp 18

## 2021-07-01 DIAGNOSIS — C189 Malignant neoplasm of colon, unspecified: Secondary | ICD-10-CM

## 2021-07-01 DIAGNOSIS — Z5111 Encounter for antineoplastic chemotherapy: Secondary | ICD-10-CM | POA: Diagnosis not present

## 2021-07-01 DIAGNOSIS — Z95828 Presence of other vascular implants and grafts: Secondary | ICD-10-CM

## 2021-07-01 MED ORDER — HEPARIN SOD (PORK) LOCK FLUSH 100 UNIT/ML IV SOLN
500.0000 [IU] | Freq: Once | INTRAVENOUS | Status: AC | PRN
  Administered 2021-07-01: 500 [IU]

## 2021-07-01 MED ORDER — SODIUM CHLORIDE 0.9% FLUSH
10.0000 mL | INTRAVENOUS | Status: DC | PRN
  Administered 2021-07-01: 10 mL

## 2021-07-01 MED ORDER — PEGFILGRASTIM-CBQV 6 MG/0.6ML ~~LOC~~ SOSY
6.0000 mg | PREFILLED_SYRINGE | Freq: Once | SUBCUTANEOUS | Status: AC
  Administered 2021-07-01: 6 mg via SUBCUTANEOUS
  Filled 2021-07-01: qty 0.6

## 2021-07-01 NOTE — Patient Instructions (Signed)
[facility]  Discharge Instructions: Thank you for choosing [facility] to provide your oncology and hematology care.  If you have a lab appointment with the Cancer Center, please come in thru the Main Entrance and check in at the main information desk.  Wear comfortable clothing and clothing appropriate for easy access to any Portacath or PICC line.   We strive to give you quality time with your provider. You may need to reschedule your appointment if you arrive late (15 or more minutes).  Arriving late affects you and other patients whose appointments are after yours.  Also, if you miss three or more appointments without notifying the office, you may be dismissed from the clinic at the provider's discretion.      For prescription refill requests, have your pharmacy contact our office and allow 72 hours for refills to be completed.    Today you received the following chemotherapy and/or immunotherapy agents Udenyca.  Pegfilgrastim Injection What is this medication? PEGFILGRASTIM (PEG fil gra stim) lowers the risk of infection in people who are receiving chemotherapy. It works by helping your body make more white blood cells, which protects your body from infection. It may also be used to help people who have been exposed to high doses of radiation. This medicine may be used for other purposes; ask your health care provider or pharmacist if you have questions. COMMON BRAND NAME(S): Fulphila, Fylnetra, Neulasta, Nyvepria, Stimufend, UDENYCA, Ziextenzo What should I tell my care team before I take this medication? They need to know if you have any of these conditions: Kidney disease Latex allergy Ongoing radiation therapy Sickle cell disease Skin reactions to acrylic adhesives (On-Body Injector only) An unusual or allergic reaction to pegfilgrastim, filgrastim, other medications, foods, dyes, or preservatives Pregnant or trying to get pregnant Breast-feeding How  should I use this medication? This medication is for injection under the skin. If you get this medication at home, you will be taught how to prepare and give the pre-filled syringe or how to use the On-body Injector. Refer to the patient Instructions for Use for detailed instructions. Use exactly as directed. Tell your care team immediately if you suspect that the On-body Injector may not have performed as intended or if you suspect the use of the On-body Injector resulted in a missed or partial dose. It is important that you put your used needles and syringes in a special sharps container. Do not put them in a trash can. If you do not have a sharps container, call your pharmacist or care team to get one. Talk to your care team about the use of this medication in children. While this medication may be prescribed for selected conditions, precautions do apply. Overdosage: If you think you have taken too much of this medicine contact a poison control center or emergency room at once. NOTE: This medicine is only for you. Do not share this medicine with others. What if I miss a dose? It is important not to miss your dose. Call your care team if you miss your dose. If you miss a dose due to an On-body Injector failure or leakage, a new dose should be administered as soon as possible using a single prefilled syringe for manual use. What may interact with this medication? Interactions have not been studied. This list may not describe all possible interactions. Give your health care provider a list of all the medicines, herbs, non-prescription drugs, or dietary supplements you use. Also tell them   if you smoke, drink alcohol, or use illegal drugs. Some items may interact with your medicine. What should I watch for while using this medication? Your condition will be monitored carefully while you are receiving this medication. You may need blood work done while you are taking this medication. Talk to your care  team about your risk of cancer. You may be more at risk for certain types of cancer if you take this medication. If you are going to need a MRI, CT scan, or other procedure, tell your care team that you are using this medication (On-Body Injector only). What side effects may I notice from receiving this medication? Side effects that you should report to your care team as soon as possible: Allergic reactions--skin rash, itching, hives, swelling of the face, lips, tongue, or throat Capillary leak syndrome--stomach or muscle pain, unusual weakness or fatigue, feeling faint or lightheaded, decrease in the amount of urine, swelling of the ankles, hands, or feet, trouble breathing High white blood cell level--fever, fatigue, trouble breathing, night sweats, change in vision, weight loss Inflammation of the aorta--fever, fatigue, back, chest, or stomach pain, severe headache Kidney injury (glomerulonephritis)--decrease in the amount of urine, red or dark Taylr Meuth urine, foamy or bubbly urine, swelling of the ankles, hands, or feet Shortness of breath or trouble breathing Spleen injury--pain in upper left stomach or shoulder Unusual bruising or bleeding Side effects that usually do not require medical attention (report to your care team if they continue or are bothersome): Bone pain Pain in the hands or feet This list may not describe all possible side effects. Call your doctor for medical advice about side effects. You may report side effects to FDA at 1-800-FDA-1088. Where should I keep my medication? Keep out of the reach of children. If you are using this medication at home, you will be instructed on how to store it. Throw away any unused medication after the expiration date on the label. NOTE: This sheet is a summary. It may not cover all possible information. If you have questions about this medicine, talk to your doctor, pharmacist, or health care provider.  2023 Elsevier/Gold Standard (2020-12-25  00:00:00)       To help prevent nausea and vomiting after your treatment, we encourage you to take your nausea medication as directed.  BELOW ARE SYMPTOMS THAT SHOULD BE REPORTED IMMEDIATELY: *FEVER GREATER THAN 100.4 F (38 C) OR HIGHER *CHILLS OR SWEATING *NAUSEA AND VOMITING THAT IS NOT CONTROLLED WITH YOUR NAUSEA MEDICATION *UNUSUAL SHORTNESS OF BREATH *UNUSUAL BRUISING OR BLEEDING *URINARY PROBLEMS (pain or burning when urinating, or frequent urination) *BOWEL PROBLEMS (unusual diarrhea, constipation, pain near the anus) TENDERNESS IN MOUTH AND THROAT WITH OR WITHOUT PRESENCE OF ULCERS (sore throat, sores in mouth, or a toothache) UNUSUAL RASH, SWELLING OR PAIN  UNUSUAL VAGINAL DISCHARGE OR ITCHING   Items with * indicate a potential emergency and should be followed up as soon as possible or go to the Emergency Department if any problems should occur.  Please show the CHEMOTHERAPY ALERT CARD or IMMUNOTHERAPY ALERT CARD at check-in to the Emergency Department and triage nurse.  Should you have questions after your visit or need to cancel or reschedule your appointment, please contact [facility] [phone]  and follow the prompts.  Office hours are 8:00 a.m. to 4:30 p.m. Monday - Friday. Please note that voicemails left after 4:00 p.m. may not be returned until the following business day.  We are closed weekends and major holidays. You have   access to a nurse at all times for urgent questions. Please call the main number to the clinic [phone] and follow the prompts.  For any non-urgent questions, you may also contact your provider using MyChart. We now offer e-Visits for anyone 52 and older to request care online for non-urgent symptoms. For details visit [email or web address].   Also download the MyChart app! Go to the app store, search "MyChart", open the app, select [facility], and log in with your MyChart username and password.  Due to Covid, a mask is required  upon entering the hospital/clinic. If you do not have a mask, one will be given to you upon arrival. For doctor visits, patients may have 1 support person aged 16 or older with them. For treatment visits, patients cannot have anyone with them due to current Covid guidelines and our immunocompromised population.

## 2021-07-01 NOTE — Progress Notes (Signed)
Patient presents today for home infusion 5FU pump disconnection.  Patient is in satisfactory condition with no complaints voiced.  Vital signs are stable.  Patient tolerated Udenyca injection with no complaints voiced.  Site clean and dry with no bruising or swelling noted.  No complaints of pain.    Discharged with vital signs stable and no signs or symptoms of distress noted.

## 2021-07-07 NOTE — Progress Notes (Unsigned)
GI Office Note    Referring Provider: Caryl Bis, MD Primary Care Physician:  Caryl Bis, MD Primary GI: Dr. Abbey Chatters  Date:  07/08/2021  ID:  Diana Fox, DOB May 11, 1970, MRN [id number]   Chief Complaint   Chief Complaint  Patient presents with   Follow-up     History of Present Illness  Diana Fox is a 51 y.o. female presenting today with a history of anemia, HTN, and recently diagnosed colon adenocarcinoma s/p bowel resection and colostomy 03/26/21*** presenting today with complaint of ***   Patient last seen in the office 02/04/2021.  She presented for hospital follow-up for colitis and IDA.  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 She had elevated inflammatory markers and evidence of IDA. She had a couple days of diarrhea prior to admission and during hospitalization negative for infectious etiology.  She was given empiric antibiotics and discharged on Augmentin with need to pursue outpatient endoscopy.  There was suspicion for underlying Crohn's disease but had no evidence of overt GI bleeding to explain her IDA. She was scheduled for colonoscopy and EGD with propofol, and advised to begin ferrous sulfate 325 mg daily.  Last colonoscopy and EGD 03/02/21. Colonoscopy with nonbleeding internal hemorrhoids, stricture in the sigmoid colon s/p biopsy and tattooing, referred to general surgery for likely resection of sigmoid colon, will need colonoscopy after resection.  EGD with gastritis s/p biopsy, normal duodenum, normal esophagus.  Stomach biopsy consistent with mild chronic inflammation and changes consistent with reactive gastropathy, no H. pylori. Sigmoid biopsy revealing adenocarcinoma, moderate lead to poorly differentiated.   Patient underwent partial colectomy with small bowel resection, and ostomy placement in February. She has  also had a Port-A-Cath placed in March 2023. Receiving chemotherapy (FOLFOXIRI + Panitumumab today).   Last labs 06/29/21 - Hgb 11.8, K 2.8, Cr 0.75, AST 30, ALT 70.    Today:  Ostomy production/appliance - some loose, sometimes formed. Some irritation around the stoma. Seeing wound/ostomy clinic.   Passing rectal stool from rectum. No. Was having mucous rectal but has resolved. No abdominal pain. No N/V. No flatulence. Energy level good.   Following with Dr. Worthy Keeler with oncology, currently receiving chemotherapy. Had 4 doses of chemo already. Seen him one month after surgery.   Dr. Arnoldo Morale said possibly 3 months or finish treatments.    Past Medical History:  Diagnosis Date   Anemia    Back pain    Hypertension    Port-A-Cath in place 05/10/2021    Past Surgical History:  Procedure Laterality Date   BIOPSY  03/02/2021   Procedure: BIOPSY;  Surgeon: Eloise Harman, DO;  [facility];  Service: Endoscopy;;   BOWEL RESECTION N/A 03/26/2021   Procedure: SMALL BOWEL RESECTION;  Surgeon: Aviva Signs, MD;  Location: [facility];  Service: General;  Laterality: N/A;   CHOLECYSTECTOMY     COLOSTOMY N/A 03/26/2021   Procedure: COLOSTOMY;  Surgeon: Aviva Signs, MD;  Location: [facility];  Service: General;  Laterality: N/A;   ESOPHAGOGASTRODUODENOSCOPY (EGD) WITH PROPOFOL N/A 03/02/2021   Procedure: ESOPHAGOGASTRODUODENOSCOPY (EGD) WITH PROPOFOL;  Surgeon: Eloise Harman, DO;  [facility];  Service: Endoscopy;  Laterality: N/A;   PARTIAL COLECTOMY N/A 03/26/2021   Procedure: PARTIAL COLECTOMY;  Surgeon: Aviva Signs, MD;  Location: [facility];  Service: General;  Laterality: N/A;   PORTACATH PLACEMENT Left 04/14/2021   Procedure:  INSERTION PORT-A-CATH;  Surgeon: Aviva Signs, MD;  Location: [facility];  Service: General;  Laterality: Left;   SCLEROTHERAPY  03/02/2021   Procedure: Clide Deutscher;  Surgeon: Eloise Harman, DO;  [facility];  Service: Endoscopy;;    SIGMOIDOSCOPY  03/02/2021   Procedure: SIGMOIDOSCOPY;  Surgeon: Eloise Harman, DO;  [facility];  Service: Endoscopy;;    Current Outpatient Medications  Medication Sig Dispense Refill   doxycycline (VIBRA-TABS) 100 MG tablet Take 1 tablet (100 mg total) by mouth 2 (two) times daily. 90 tablet 3   ferrous sulfate 325 (65 FE) MG tablet TAKE 325 MG BY MOUTH DAILY. (Patient taking differently: 325 mg daily with breakfast.) 90 tablet 1   fexofenadine (ALLEGRA) 180 MG tablet Take 1 tablet (180 mg total) by mouth daily. 30 tablet 0   fluorouracil CALGB 16109 2,400 mg/m2 in sodium chloride 0.9 % 150 mL Inject 2,400 mg/m2 into the vein over 48 hr. Every 14 days     FLUOROURACIL IV Inject into the vein every 14 (fourteen) days.     fluticasone (FLONASE) 50 MCG/ACT nasal spray Place 2 sprays into both nostrils daily. 16 g 12   hydrocortisone 1 % lotion Apply 1 application. topically 2 (two) times daily. 118 mL 5   KLOR-CON M20 20 MEQ tablet TAKE 3 TABLETS (60 MEQ TOTAL) BY MOUTH DAILY FOR 1 DAY, THEN 1 TABLET (20 MEQ TOTAL) DAILY. 33 tablet 0   lidocaine (XYLOCAINE) 2 % solution Use as directed 10 mLs in the mouth or throat 4 (four) times daily as needed for mouth pain (Mix 1:1 with carafate and swish and spit). 100 mL ML   magnesium oxide (MAG-OX) 400 (240 Mg) MG tablet Take 1 tablet (400 mg total) by mouth 2 (two) times daily. 60 tablet 6   medroxyPROGESTERone (DEPO-PROVERA) 150 MG/ML injection Inject 150 mg into the muscle every 3 (three) months.     medroxyPROGESTERone (PROVERA) 5 MG tablet Take 5 mg by mouth every other day.     megestrol (MEGACE) 400 MG/10ML suspension Take 10 mLs (400 mg total) by mouth 2 (two) times daily. 240 mL 2   Multiple Vitamin (MULTIVITAMIN) tablet Take 1 tablet by mouth daily.     nebivolol (BYSTOLIC) 10 MG tablet Take 1 tablet (10 mg total) by mouth daily. 30 tablet 1   OXALIPLATIN IV Inject into the vein every 14 (fourteen) days.     Panitumumab  (VECTIBIX IV) Inject into the vein every 14 (fourteen) days.     pantoprazole (PROTONIX) 40 MG tablet Take 40 mg by mouth daily as needed (acid reflux).     sucralfate (CARAFATE) 1 GM/10ML suspension Take 10 mLs (1 g total) by mouth 4 (four) times daily as needed. Mix 1:1 with 2% lidocaine viscous solution and swish and spit 420 mL 6   HYDROcodone-acetaminophen (NORCO) 7.5-325 MG tablet Take 1 tablet by mouth every 6 (six) hours as needed for moderate pain. (Patient not taking: Reported on 07/08/2021) 28 tablet 0   LEUCOVORIN CALCIUM IV Inject into the vein every 14 (fourteen) days. (Patient not taking: Reported on 07/08/2021)     lidocaine-prilocaine (EMLA) cream Apply a small amount to port a cath site and cover with plastic wrap 1 hour prior to chemotherapy appointments (Patient not taking: Reported on 06/29/2021) 30 g 3   loratadine (CLARITIN) 10 MG tablet Take 1 tablet by mouth daily. (Patient not taking: Reported on 07/08/2021)     ondansetron (ZOFRAN) 4 MG tablet Take 1  tablet (4 mg total) by mouth every 8 (eight) hours as needed for nausea or vomiting. (Patient not taking: Reported on 06/29/2021) 20 tablet 0   prochlorperazine (COMPAZINE) 10 MG tablet Take 1 tablet (10 mg total) by mouth every 6 (six) hours as needed (Nausea or vomiting). (Patient not taking: Reported on 06/29/2021) 30 tablet 1   No current facility-administered medications for this visit.    Allergies as of 07/08/2021 - Review Complete 07/08/2021  Allergen Reaction Noted   Azithromycin Hives 01/12/2021   Motrin [ibuprofen] Itching 10/14/2014   Lisinopril  09/02/2013   Sudafed [pseudoephedrine hcl] Other (See Comments) 02/04/2015    Family History  Problem Relation Age of Onset   Hypertension Mother    Hypertension Father    Colon cancer Neg Hx    Colon polyps Neg Hx    Inflammatory bowel disease Neg Hx     Social History   Socioeconomic History   Marital status: Married    Spouse name: Not on file   Number of  children: Not on file   Years of education: Not on file   Highest education level: Not on file  Occupational History   Not on file  Tobacco Use   Smoking status: Never   Smokeless tobacco: Never  Vaping Use   Vaping Use: Never used  Substance and Sexual Activity   Alcohol use: No   Drug use: No   Sexual activity: Not on file  Other Topics Concern   Not on file  Social History Narrative   Not on file   Social Determinants of Health   Financial Resource Strain: High Risk   Difficulty of Paying Living Expenses: Hard  Food Insecurity: Not on file  Transportation Needs: Not on file  Physical Activity: Not on file  Stress: Not on file  Social Connections: Not on file     Review of Systems   Gen: Denies fever, chills, anorexia. Denies fatigue, weakness, weight loss.  CV: Denies chest pain, palpitations, syncope, peripheral edema, and claudication. Resp: Denies dyspnea at rest, cough, wheezing, coughing up blood, and pleurisy. GI: see HPI Derm: Denies rash, itching, dry skin Psych: Denies depression, anxiety, memory loss, confusion. No homicidal or suicidal ideation.  Heme: Denies bruising, bleeding, and enlarged lymph nodes.   Physical Exam   BP 120/72   Pulse 60   Temp 97.7 F (36.5 C) (Temporal)   Ht '5\' 5"'$  (1.651 m)   Wt 119 lb 4.8 oz (54.1 kg)   BMI 19.85 kg/m   General:   Alert and oriented. No distress noted. Pleasant and cooperative.  Head:  Normocephalic and atraumatic. Eyes:  Conjuctiva clear without scleral icterus. Lungs:  Clear to auscultation bilaterally. No wheezes, rales, or rhonchi. No distress.  Heart:  S1, S2 present without murmurs appreciated.  Abdomen:  +BS, soft, non-tender and non-distended.  Ostomy in place, stoma pink and moist, stool present in bag.  No rebound or guarding. No HSM or masses noted. Rectal: deferred Msk:  Symmetrical without gross deformities. Normal posture. Extremities:  Without edema. Neurologic:  Alert and  oriented  x4 Psych:  Alert and cooperative. Normal mood and affect.   Assessment  Diana Fox is a 51 y.o. female with a history of anemia, HTN, and recently diagnosed colon adenocarcinoma in January 2023 presenting today with weight loss and wanting to reverse ostomy.   Sigmoid adenocarcinoma: Found during colonoscopy March 02, 2021. Severe stenosis about 17/18 cm from anal verge and unable to pass with pediatric scope,  biopsies and tattooing performed. Biopsy consistent with adenocarcinoma, poorly differentiated. She underwent bowel resection with colostomy placement 03/26/21 and has been following with oncology regularly and receiving chemotherapy. She had a port a cath placed 04/14/21.  She has been doing well.  No longer having mucus from her rectal vault, not passing stool.  Denies melena or BRBPR, no blood via her ostomy.  Reports she does have a slight bit of irritation around her stoma.  She follows closely with the wound ostomy clinic.  She has been receiving chemo and following closely with oncology.  She has developed sores in her mouth which is made her unable to eat much solid foods, she has been treated with a mouthwash.  She has been drinking boost if she is able to find the one that she likes, provided her with examples of protein shakes that she can try.  Advised 2-3 a day with goal of at least 80 g of protein.  Patient is ready to have her ostomy reversed if able to, stated originally she was told she may only have it for about 3 months, then the decision was made to do it after she finished treatment.  Should proceed with investigating the rest of her colon prior to ostomy reversal, will set her up for colonoscopy via ostomy and flex sigmoidoscopy once clearance given by Dr. Delton Coombes as she is receiving chemo.  Stoma and ostomy reviewed during visit today, soft/mushy stool present in stoma pink and moist and intact.  Anemia: Hgb recently improved to 11.8, has been as low as 6.6 post op after  bowel resection. She was maintained in the 9-10 range during March/April. She has been on oral iron and currently receiving chemotherapy for colonic adenocarcinoma.  She reports feeling great, no lack of appetite or fatigue.  She does report sores in her mouth, states this is a side effect of her chemo. Continue oral iron.    PLAN   Proceed with colonoscopy via ostomy and flex sigmoidoscopy by Dr. Abbey Chatters  in near future: the risks, benefits, and alternatives have been discussed with the patient in detail. The patient states understanding and desires to proceed. ASA 2 Fleet enema x 2 morning prior to procedure. Continue oral iron. Hold iron for 10 days prior to procedure. Clearance from oncology (Dr. Delton Coombes) prior to scheduling. Increase protein in her diet, 2-3 shakes a day, at least 80 g of protein daily. Continue to follow closely with oncology and general surgery.   Venetia Night, MSN, FNP-BC, AGACNP-BC [facility]

## 2021-07-08 ENCOUNTER — Other Ambulatory Visit (HOSPITAL_COMMUNITY): Payer: Self-pay | Admitting: *Deleted

## 2021-07-08 ENCOUNTER — Ambulatory Visit (INDEPENDENT_AMBULATORY_CARE_PROVIDER_SITE_OTHER): Payer: Medicaid Other | Admitting: Gastroenterology

## 2021-07-08 ENCOUNTER — Telehealth: Payer: Self-pay | Admitting: *Deleted

## 2021-07-08 ENCOUNTER — Encounter: Payer: Self-pay | Admitting: Gastroenterology

## 2021-07-08 VITALS — BP 120/72 | HR 60 | Temp 97.7°F | Ht 65.0 in | Wt 119.3 lb

## 2021-07-08 DIAGNOSIS — C187 Malignant neoplasm of sigmoid colon: Secondary | ICD-10-CM | POA: Diagnosis not present

## 2021-07-08 DIAGNOSIS — D649 Anemia, unspecified: Secondary | ICD-10-CM

## 2021-07-08 NOTE — Telephone Encounter (Signed)
Attention: Preop   We would like to request clearance for patient since she is undergoing chemotherapy   Procedure: Flex Sigmoidoscopy   Date: TBD  Surgeon: Dr. Abbey Chatters   Phone: [phone]  Fax:  [phone]  Type of Anesthesia: Propofol ASA II   If you have any questions or concerns please call the office.Marland Kitchen

## 2021-07-08 NOTE — Telephone Encounter (Signed)
Sent clearance for procedure. Waiting for approval.

## 2021-07-08 NOTE — Patient Instructions (Signed)
We will get you scheduled for colonoscopy and flex sigmoidoscopy with Dr. Abbey Chatters in near future after reaching out to Dr. Delton Coombes for clearance regarding your chemo treatment.  Prior to procedure you need to hold your iron for 10 days.  You will receive instructions in the mail once you are scheduled.  In order to improve your weight I encourage you to get at least 80 g of protein a day, drinking whenever protein shakes you like (2-3 a day).  Boost, fair life core power, Premier protein, and sugar are all good options.  You can find them at [place], [place], [place], [employer] and you can get individuals usually at gas stations such as [employer].    Follow-up as needed in between, do not hesitate to contact the office.  It was a pleasure to see you today. I want to create trusting relationships with patients. If you receive a survey regarding your visit,  I greatly appreciate you taking time to fill this out on paper or through your MyChart. I value your feedback.  Venetia Night, MSN, FNP-BC, AGACNP-BC [facility]

## 2021-07-09 NOTE — Telephone Encounter (Addendum)
Received clearance for procedure. Copy placed on providers desk.

## 2021-07-12 ENCOUNTER — Encounter (HOSPITAL_COMMUNITY): Payer: Self-pay | Admitting: Hematology

## 2021-07-12 MED FILL — Dexamethasone Sodium Phosphate Inj 100 MG/10ML: INTRAMUSCULAR | Qty: 1 | Status: AC

## 2021-07-12 MED FILL — Fosaprepitant Dimeglumine For IV Infusion 150 MG (Base Eq): INTRAVENOUS | Qty: 5 | Status: AC

## 2021-07-12 NOTE — Telephone Encounter (Signed)
Called pt. She will get her July schedule tomorrow for Chemo and will call back so we can schedule for July as she wants morning appt.

## 2021-07-12 NOTE — Telephone Encounter (Signed)
Noted  

## 2021-07-13 ENCOUNTER — Inpatient Hospital Stay (HOSPITAL_COMMUNITY): Payer: Medicaid Other | Attending: Hematology

## 2021-07-13 ENCOUNTER — Encounter (HOSPITAL_COMMUNITY): Payer: Self-pay

## 2021-07-13 ENCOUNTER — Inpatient Hospital Stay (HOSPITAL_COMMUNITY): Payer: Medicaid Other

## 2021-07-13 VITALS — BP 113/82 | HR 72 | Temp 97.2°F | Resp 18

## 2021-07-13 DIAGNOSIS — Z5189 Encounter for other specified aftercare: Secondary | ICD-10-CM | POA: Insufficient documentation

## 2021-07-13 DIAGNOSIS — R21 Rash and other nonspecific skin eruption: Secondary | ICD-10-CM | POA: Insufficient documentation

## 2021-07-13 DIAGNOSIS — I10 Essential (primary) hypertension: Secondary | ICD-10-CM | POA: Diagnosis not present

## 2021-07-13 DIAGNOSIS — Z5111 Encounter for antineoplastic chemotherapy: Secondary | ICD-10-CM | POA: Diagnosis present

## 2021-07-13 DIAGNOSIS — R251 Tremor, unspecified: Secondary | ICD-10-CM | POA: Insufficient documentation

## 2021-07-13 DIAGNOSIS — E876 Hypokalemia: Secondary | ICD-10-CM | POA: Insufficient documentation

## 2021-07-13 DIAGNOSIS — D509 Iron deficiency anemia, unspecified: Secondary | ICD-10-CM | POA: Insufficient documentation

## 2021-07-13 DIAGNOSIS — Z79899 Other long term (current) drug therapy: Secondary | ICD-10-CM | POA: Diagnosis not present

## 2021-07-13 DIAGNOSIS — C187 Malignant neoplasm of sigmoid colon: Secondary | ICD-10-CM | POA: Diagnosis not present

## 2021-07-13 DIAGNOSIS — R7401 Elevation of levels of liver transaminase levels: Secondary | ICD-10-CM | POA: Diagnosis not present

## 2021-07-13 DIAGNOSIS — C189 Malignant neoplasm of colon, unspecified: Secondary | ICD-10-CM

## 2021-07-13 DIAGNOSIS — Z95828 Presence of other vascular implants and grafts: Secondary | ICD-10-CM

## 2021-07-13 LAB — CBC WITH DIFFERENTIAL/PLATELET
Abs Immature Granulocytes: 0.16 10*3/uL — ABNORMAL HIGH (ref 0.00–0.07)
Basophils Absolute: 0.1 10*3/uL (ref 0.0–0.1)
Basophils Relative: 1 %
Eosinophils Absolute: 0.1 10*3/uL (ref 0.0–0.5)
Eosinophils Relative: 1 %
HCT: 41.1 % (ref 36.0–46.0)
Hemoglobin: 13.9 g/dL (ref 12.0–15.0)
Immature Granulocytes: 2 %
Lymphocytes Relative: 53 %
Lymphs Abs: 5 10*3/uL — ABNORMAL HIGH (ref 0.7–4.0)
MCH: 27.7 pg (ref 26.0–34.0)
MCHC: 33.8 g/dL (ref 30.0–36.0)
MCV: 81.9 fL (ref 80.0–100.0)
Monocytes Absolute: 1.1 10*3/uL — ABNORMAL HIGH (ref 0.1–1.0)
Monocytes Relative: 11 %
Neutro Abs: 3 10*3/uL (ref 1.7–7.7)
Neutrophils Relative %: 32 %
Platelets: 237 10*3/uL (ref 150–400)
RBC: 5.02 MIL/uL (ref 3.87–5.11)
RDW: 22.1 % — ABNORMAL HIGH (ref 11.5–15.5)
WBC: 9.4 10*3/uL (ref 4.0–10.5)
nRBC: 0 % (ref 0.0–0.2)

## 2021-07-13 LAB — COMPREHENSIVE METABOLIC PANEL
ALT: 206 U/L — ABNORMAL HIGH (ref 0–44)
AST: 81 U/L — ABNORMAL HIGH (ref 15–41)
Albumin: 4.2 g/dL (ref 3.5–5.0)
Alkaline Phosphatase: 92 U/L (ref 38–126)
Anion gap: 9 (ref 5–15)
BUN: 18 mg/dL (ref 6–20)
CO2: 19 mmol/L — ABNORMAL LOW (ref 22–32)
Calcium: 9.1 mg/dL (ref 8.9–10.3)
Chloride: 109 mmol/L (ref 98–111)
Creatinine, Ser: 1.19 mg/dL — ABNORMAL HIGH (ref 0.44–1.00)
GFR, Estimated: 56 mL/min — ABNORMAL LOW (ref >60)
Glucose, Bld: 124 mg/dL — ABNORMAL HIGH (ref 70–99)
Potassium: 2.5 mmol/L — CL (ref 3.5–5.1)
Sodium: 137 mmol/L (ref 135–145)
Total Bilirubin: 0.7 mg/dL (ref 0.3–1.2)
Total Protein: 7.6 g/dL (ref 6.5–8.1)

## 2021-07-13 LAB — MAGNESIUM: Magnesium: 1.7 mg/dL (ref 1.7–2.4)

## 2021-07-13 MED ORDER — HEPARIN SOD (PORK) LOCK FLUSH 100 UNIT/ML IV SOLN
500.0000 [IU] | Freq: Once | INTRAVENOUS | Status: AC
  Administered 2021-07-13: 500 [IU] via INTRAVENOUS

## 2021-07-13 MED ORDER — POTASSIUM CHLORIDE CRYS ER 20 MEQ PO TBCR
40.0000 meq | EXTENDED_RELEASE_TABLET | Freq: Once | ORAL | Status: AC
  Administered 2021-07-13: 40 meq via ORAL
  Filled 2021-07-13: qty 2

## 2021-07-13 MED ORDER — POTASSIUM CHLORIDE 10 MEQ/100ML IV SOLN
10.0000 meq | INTRAVENOUS | Status: AC
  Administered 2021-07-13 (×2): 10 meq via INTRAVENOUS
  Filled 2021-07-13 (×2): qty 100

## 2021-07-13 MED ORDER — SODIUM CHLORIDE 0.9% FLUSH
10.0000 mL | Freq: Once | INTRAVENOUS | Status: AC
  Administered 2021-07-13: 10 mL via INTRAVENOUS

## 2021-07-13 MED ORDER — SODIUM CHLORIDE 0.9 % IV SOLN: INTRAVENOUS | Status: DC

## 2021-07-13 NOTE — Patient Instructions (Signed)
[facility]  Discharge Instructions: Thank you for choosing [facility] to provide your oncology and hematology care.  If you have a lab appointment with the [facility], please come in thru the Main Entrance and check in at the main information desk.  Wear comfortable clothing and clothing appropriate for easy access to any Portacath or PICC line.   We strive to give you quality time with your provider. You may need to reschedule your appointment if you arrive late (15 or more minutes).  Arriving late affects you and other patients whose appointments are after yours.  Also, if you miss three or more appointments without notifying the office, you may be dismissed from the clinic at the provider's discretion.      For prescription refill requests, have your pharmacy contact our office and allow 72 hours for refills to be completed.    Today you did not receive treatment. You did receive 80 mEq of PO potassium and 20 mEq of IV potassium, return as scheduled.   To help prevent nausea and vomiting after your treatment, we encourage you to take your nausea medication as directed.  BELOW ARE SYMPTOMS THAT SHOULD BE REPORTED IMMEDIATELY: *FEVER GREATER THAN 100.4 F (38 C) OR HIGHER *CHILLS OR SWEATING *NAUSEA AND VOMITING THAT IS NOT CONTROLLED WITH YOUR NAUSEA MEDICATION *UNUSUAL SHORTNESS OF BREATH *UNUSUAL BRUISING OR BLEEDING *URINARY PROBLEMS (pain or burning when urinating, or frequent urination) *BOWEL PROBLEMS (unusual diarrhea, constipation, pain near the anus) TENDERNESS IN MOUTH AND THROAT WITH OR WITHOUT PRESENCE OF ULCERS (sore throat, sores in mouth, or a toothache) UNUSUAL RASH, SWELLING OR PAIN  UNUSUAL VAGINAL DISCHARGE OR ITCHING   Items with * indicate a potential emergency and should be followed up as soon as possible or go to the Emergency Department if any problems should occur.  Please show the CHEMOTHERAPY ALERT CARD or IMMUNOTHERAPY ALERT  CARD at check-in to the Emergency Department and triage nurse.  Should you have questions after your visit or need to cancel or reschedule your appointment, please contact [facility] [phone]  and follow the prompts.  Office hours are 8:00 a.m. to 4:30 p.m. Monday - Friday. Please note that voicemails left after 4:00 p.m. may not be returned until the following business day.  We are closed weekends and major holidays. You have access to a nurse at all times for urgent questions. Please call the main number to the clinic [phone] and follow the prompts.  For any non-urgent questions, you may also contact your provider using MyChart. We now offer e-Visits for anyone 20 and older to request care online for non-urgent symptoms. For details visit [email or web address].   Also download the MyChart app! Go to the app store, search "MyChart", open the app, select [facility], and log in with your MyChart username and password.  Due to Covid, a mask is required upon entering the hospital/clinic. If you do not have a mask, one will be given to you upon arrival. For doctor visits, patients may have 1 support person aged 30 or older with them. For treatment visits, patients cannot have anyone with them due to current Covid guidelines and our immunocompromised population.

## 2021-07-13 NOTE — Progress Notes (Signed)
The following Critical lab noted:  2.5 K + Dr. Delton Coombes made aware.    Elizabeth Sauer, RN 07/13/2021 9:03 AM

## 2021-07-13 NOTE — Progress Notes (Signed)
Patient reports today or Folfoxiri. Patient reports still having mouth sores affecting oral intake. Patient has lost 9lbs since last visit. She denies N&V, constipation or diarrhea. Patient reports she picked up her mouth wash prescription at the end of last week. Potassium today is 2.5 patient reports taking potassium at home. Dr. Delton Coombes made aware of patient's symptoms and labs, per Dr. Delton Coombes hold treatment today and give 52mq PO potassium, 20 mEq of IV potassium & 40 mEqof PO potassium. Pharmacy aware.   Patient tolerated potassium therapy with no complaints voiced. Side effects with management reviewed with understanding verbalized. Port site clean and dry with no bruising or swelling noted at site. Good blood return noted before and after administration of therapy. Band aid applied. Patient left in satisfactory condition with VSS and no s/s of distress noted.

## 2021-07-14 LAB — CEA: CEA: 16.2 ng/mL — ABNORMAL HIGH (ref 0.0–4.7)

## 2021-07-15 ENCOUNTER — Inpatient Hospital Stay (HOSPITAL_COMMUNITY): Payer: Medicaid Other

## 2021-07-19 ENCOUNTER — Telehealth (HOSPITAL_COMMUNITY): Payer: Self-pay | Admitting: Dietician

## 2021-07-19 NOTE — Telephone Encounter (Signed)
Attempted to reach patient via telephone for nutrition follow-up. Patient did not answer. Left voice mail with request for return call. Contact information provided.

## 2021-07-26 NOTE — Telephone Encounter (Signed)
Followed up with pt. She does not have her July chemo schedule yet. She will call once she does

## 2021-07-27 ENCOUNTER — Encounter (HOSPITAL_COMMUNITY): Payer: Self-pay | Admitting: Lab

## 2021-07-27 ENCOUNTER — Inpatient Hospital Stay (HOSPITAL_BASED_OUTPATIENT_CLINIC_OR_DEPARTMENT_OTHER): Payer: Medicaid Other | Admitting: Hematology

## 2021-07-27 ENCOUNTER — Inpatient Hospital Stay (HOSPITAL_COMMUNITY): Payer: Medicaid Other

## 2021-07-27 VITALS — BP 137/78 | HR 79 | Temp 98.3°F | Resp 18

## 2021-07-27 DIAGNOSIS — C189 Malignant neoplasm of colon, unspecified: Secondary | ICD-10-CM

## 2021-07-27 DIAGNOSIS — Z95828 Presence of other vascular implants and grafts: Secondary | ICD-10-CM

## 2021-07-27 DIAGNOSIS — E876 Hypokalemia: Secondary | ICD-10-CM

## 2021-07-27 DIAGNOSIS — Z5111 Encounter for antineoplastic chemotherapy: Secondary | ICD-10-CM | POA: Diagnosis not present

## 2021-07-27 LAB — CBC WITH DIFFERENTIAL/PLATELET
Abs Immature Granulocytes: 0.05 10*3/uL (ref 0.00–0.07)
Basophils Absolute: 0.1 10*3/uL (ref 0.0–0.1)
Basophils Relative: 1 %
Eosinophils Absolute: 0.6 10*3/uL — ABNORMAL HIGH (ref 0.0–0.5)
Eosinophils Relative: 7 %
HCT: 31 % — ABNORMAL LOW (ref 36.0–46.0)
Hemoglobin: 10.3 g/dL — ABNORMAL LOW (ref 12.0–15.0)
Immature Granulocytes: 1 %
Lymphocytes Relative: 37 %
Lymphs Abs: 2.9 10*3/uL (ref 0.7–4.0)
MCH: 28.7 pg (ref 26.0–34.0)
MCHC: 33.2 g/dL (ref 30.0–36.0)
MCV: 86.4 fL (ref 80.0–100.0)
Monocytes Absolute: 0.8 10*3/uL (ref 0.1–1.0)
Monocytes Relative: 10 %
Neutro Abs: 3.5 10*3/uL (ref 1.7–7.7)
Neutrophils Relative %: 44 %
Platelets: 201 10*3/uL (ref 150–400)
RBC: 3.59 MIL/uL — ABNORMAL LOW (ref 3.87–5.11)
RDW: 23.5 % — ABNORMAL HIGH (ref 11.5–15.5)
WBC: 7.9 10*3/uL (ref 4.0–10.5)
nRBC: 0 % (ref 0.0–0.2)

## 2021-07-27 LAB — COMPREHENSIVE METABOLIC PANEL
ALT: 57 U/L — ABNORMAL HIGH (ref 0–44)
AST: 45 U/L — ABNORMAL HIGH (ref 15–41)
Albumin: 3.1 g/dL — ABNORMAL LOW (ref 3.5–5.0)
Alkaline Phosphatase: 41 U/L (ref 38–126)
Anion gap: 3 — ABNORMAL LOW (ref 5–15)
BUN: 6 mg/dL (ref 6–20)
CO2: 23 mmol/L (ref 22–32)
Calcium: 8.3 mg/dL — ABNORMAL LOW (ref 8.9–10.3)
Chloride: 114 mmol/L — ABNORMAL HIGH (ref 98–111)
Creatinine, Ser: 0.65 mg/dL (ref 0.44–1.00)
GFR, Estimated: >60 mL/min (ref >60)
Glucose, Bld: 100 mg/dL — ABNORMAL HIGH (ref 70–99)
Potassium: 3.1 mmol/L — ABNORMAL LOW (ref 3.5–5.1)
Sodium: 140 mmol/L (ref 135–145)
Total Bilirubin: 0.3 mg/dL (ref 0.3–1.2)
Total Protein: 6 g/dL — ABNORMAL LOW (ref 6.5–8.1)

## 2021-07-27 LAB — MAGNESIUM: Magnesium: 1.3 mg/dL — ABNORMAL LOW (ref 1.7–2.4)

## 2021-07-27 MED ORDER — MAGNESIUM SULFATE 2 GM/50ML IV SOLN
2.0000 g | Freq: Once | INTRAVENOUS | Status: AC
  Administered 2021-07-27: 2 g via INTRAVENOUS
  Filled 2021-07-27: qty 50

## 2021-07-27 MED ORDER — SODIUM CHLORIDE 0.9 % IV SOLN
6.0000 mg/kg | Freq: Once | INTRAVENOUS | Status: AC
  Administered 2021-07-27: 360 mg via INTRAVENOUS
  Filled 2021-07-27: qty 18

## 2021-07-27 MED ORDER — POTASSIUM CHLORIDE CRYS ER 20 MEQ PO TBCR
40.0000 meq | EXTENDED_RELEASE_TABLET | Freq: Once | ORAL | Status: AC
  Administered 2021-07-27: 40 meq via ORAL
  Filled 2021-07-27: qty 2

## 2021-07-27 MED ORDER — ATROPINE SULFATE 1 MG/ML IV SOLN
0.5000 mg | Freq: Once | INTRAVENOUS | Status: AC
  Administered 2021-07-27: 0.5 mg via INTRAVENOUS
  Filled 2021-07-27: qty 1

## 2021-07-27 MED ORDER — OXALIPLATIN CHEMO INJECTION 100 MG/20ML
85.0000 mg/m2 | Freq: Once | INTRAVENOUS | Status: AC
  Administered 2021-07-27: 140 mg via INTRAVENOUS
  Filled 2021-07-27: qty 28

## 2021-07-27 MED ORDER — SODIUM CHLORIDE 0.9 % IV SOLN
165.0000 mg/m2 | Freq: Once | INTRAVENOUS | Status: AC
  Administered 2021-07-27: 280 mg via INTRAVENOUS
  Filled 2021-07-27: qty 4

## 2021-07-27 MED ORDER — SODIUM CHLORIDE 0.9 % IV SOLN
150.0000 mg | Freq: Once | INTRAVENOUS | Status: AC
  Administered 2021-07-27: 150 mg via INTRAVENOUS
  Filled 2021-07-27: qty 150

## 2021-07-27 MED ORDER — PALONOSETRON HCL INJECTION 0.25 MG/5ML
0.2500 mg | Freq: Once | INTRAVENOUS | Status: AC
  Administered 2021-07-27: 0.25 mg via INTRAVENOUS
  Filled 2021-07-27: qty 5

## 2021-07-27 MED ORDER — SODIUM CHLORIDE 0.9 % IV SOLN: Freq: Once | INTRAVENOUS | Status: AC

## 2021-07-27 MED ORDER — LEUCOVORIN CALCIUM INJECTION 350 MG
200.0000 mg/m2 | Freq: Once | INTRAVENOUS | Status: AC
  Administered 2021-07-27: 332 mg via INTRAVENOUS
  Filled 2021-07-27: qty 16.6

## 2021-07-27 MED ORDER — SODIUM CHLORIDE 0.9 % IV SOLN
10.0000 mg | Freq: Once | INTRAVENOUS | Status: AC
  Administered 2021-07-27: 10 mg via INTRAVENOUS
  Filled 2021-07-27: qty 10

## 2021-07-27 MED ORDER — DEXTROSE 5 % IV SOLN: Freq: Once | INTRAVENOUS | Status: AC

## 2021-07-27 MED ORDER — SODIUM CHLORIDE 0.9 % IV SOLN
2400.0000 mg/m2 | INTRAVENOUS | Status: DC
  Administered 2021-07-27: 4000 mg via INTRAVENOUS
  Filled 2021-07-27: qty 80

## 2021-07-27 NOTE — Patient Instructions (Addendum)
[facility] Discharge Instructions   You were seen and examined today by Dr. Delton Coombes.  He reviewed your lab work today which is normal/stable.   We will proceed with your treatment today.   We will make a referral to Dr. Crisoforo Oxford for surgical consult.  CT of chest/abdomen/pelvis as scheduled.   Return in 2 weeks as scheduled.    Thank you for choosing [facility] at [facility] to provide your oncology and hematology care.  To afford each patient quality time with our provider, please arrive at least 15 minutes before your scheduled appointment time.   If you have a lab appointment with the [facility] please come in thru the Main Entrance and check in at the main information desk.  You need to re-schedule your appointment should you arrive 10 or more minutes late.  We strive to give you quality time with our providers, and arriving late affects you and other patients whose appointments are after yours.  Also, if you no show three or more times for appointments you may be dismissed from the clinic at the providers discretion.     Again, thank you for choosing [facility].  Our hope is that these requests will decrease the amount of time that you wait before being seen by our physicians.       _____________________________________________________________  Should you have questions after your visit to [facility], please contact our office at [phone] and follow the prompts.  Our office hours are 8:00 a.m. and 4:30 p.m. Monday - Friday.  Please note that voicemails left after 4:00 p.m. may not be returned until the following business day.  We are closed weekends and major holidays.  You do have access to a nurse 24-7, just call the main number to the clinic [phone] and do not press any options, hold on the line and a nurse will answer the phone.    For prescription refill requests, have your  pharmacy contact our office and allow 72 hours.    Due to Covid, you will need to wear a mask upon entering the hospital. If you do not have a mask, a mask will be given to you at the Main Entrance upon arrival. For doctor visits, patients may have 1 support person age 68 or older with them. For treatment visits, patients can not have anyone with them due to social distancing guidelines and our immunocompromised population.

## 2021-07-27 NOTE — Progress Notes (Signed)
Pt presents today for Vectibix/Folfoxiri per provider's order. Vital signs and other labs WNL for treatment today. Pt's magnesium was 1.4 and potassium was 3.1 today. Pt will receive 4g IV magnesium sulfate and potassium chloride 40 mEq p.o x 1 dose per Dr.K  Vectibix/ Folfoxiri/ 4g IV magnesium sulfate, and 40 mEq potassium chloride p.o x 1 dose given today per MD orders. Tolerated infusion without adverse affects. Vital signs stable. No complaints at this time. Discharged from clinic ambulatory in stable condition. Alert and oriented x 3. F/U with [facility] as scheduled.  5FU ambulatory pump infusing.

## 2021-07-27 NOTE — Progress Notes (Signed)
[facility] [address] [place], [place] [address]   CLINIC:  Medical Oncology/Hematology  PCP:  [name], MD [address] 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  PRIOR THERAPY: Sigmoid colon resection, partial small bowel resection and Hartman's procedure on 03/26/2021  NGS Results: KRAS/NRAS negative, BRAF negative, HER2 negative, MSI-stable  CURRENT THERAPY: FOLFOXIRI + Panitumumab q14d  BRIEF ONCOLOGIC HISTORY:  Oncology History  Colon carcinoma (HCC)  03/26/2021 Initial Diagnosis   Colon carcinoma (HCC)   04/06/2021 Cancer Staging   Staging form: Colon and Rectum, AJCC 8th Edition - Clinical stage from 04/06/2021: Stage IVC (cT4a, cN2b, pM1c) - Signed by [name], MD on 05/04/2021 Histopathologic type: Adenocarcinoma, NOS Stage prefix: Initial diagnosis Total positive nodes: 9 Total nodes examined: 9 Tumor deposits (TD): Present Carcinoembryonic antigen (CEA) (ng/mL): 61.5 Perineural invasion (PNI): Present Microsatellite instability (MSI): Stable KRAS mutation: Negative NRAS mutation: Negative BRAF mutation: Negative   05/17/2021 -  Chemotherapy   Patient is on Treatment Plan : COLORECTAL FOLFOXIRI + Panitumumab q14d       CANCER STAGING:  Cancer Staging  Colon carcinoma [facility]) Staging form: Colon and Rectum, AJCC 8th Edition - Clinical stage from 04/06/2021: Stage IVC (cT4a, cN2b, pM1c) - Signed by [name], MD on 05/04/2021   INTERVAL HISTORY:  [name], a 51 y.o. female, returns for routine follow-up and consideration for next cycle of chemotherapy. Diana Fox was last seen on 06/29/21.  Due for cycle #5 of  FOLFOXIRI + Panitumumab  today.   Overall, she tells me she has been feeling pretty well. She reports hair thinning. She denies n/v/d and abdominal cramping. She has gained 16 lbs since 6/6. She reports she  is eating well with 2-3 bottle of Boost daily. She denies skin rashes. She continues to take an iron tablet daily, and she takes potassium TID. She reports her mouth sores have resolved. Her appetite is good.   Overall, she feels ready for next cycle of chemo today.    REVIEW OF SYSTEMS:  Review of Systems  Constitutional:  Negative for appetite change, fatigue and unexpected weight change.  HENT:   Negative for mouth sores.   Gastrointestinal:  Negative for abdominal pain, diarrhea, nausea and vomiting.  Skin:  Negative for rash.  All other systems reviewed and are negative.   PAST MEDICAL/SURGICAL HISTORY:  Past Medical History:  Diagnosis Date   Anemia    Back pain    Hypertension    Port-A-Cath in place 05/10/2021   Past Surgical History:  Procedure Laterality Date   BIOPSY  03/02/2021   Procedure: BIOPSY;  Surgeon: Lanelle Bal, DO;  Location: [facility];  Service: Endoscopy;;   BOWEL RESECTION N/A 03/26/2021   Procedure: SMALL BOWEL RESECTION;  Surgeon: Franky Macho, MD;  Location: [facility] ORS;  Service: General;  Laterality: N/A;   CHOLECYSTECTOMY     COLOSTOMY N/A 03/26/2021   Procedure: COLOSTOMY;  Surgeon: Franky Macho, MD;  Location: [facility] ORS;  Service: General;  Laterality: N/A;   ESOPHAGOGASTRODUODENOSCOPY (EGD) WITH PROPOFOL N/A 03/02/2021   Procedure: ESOPHAGOGASTRODUODENOSCOPY (EGD) WITH PROPOFOL;  Surgeon: Lanelle Bal, DO;  Location: [facility];  Service: Endoscopy;  Laterality: N/A;   PARTIAL COLECTOMY N/A 03/26/2021   Procedure: PARTIAL COLECTOMY;  Surgeon: Franky Macho, MD;  Location: [facility] ORS;  Service: General;  Laterality: N/A;   PORTACATH PLACEMENT Left 04/14/2021  Procedure: INSERTION PORT-A-CATH;  Surgeon: Aviva Signs, MD;  Location: [facility] ORS;  Service: General;  Laterality: Left;   SCLEROTHERAPY  03/02/2021   Procedure: Clide Deutscher;  Surgeon: Eloise Harman, DO;  Location: [facility];  Service: Endoscopy;;   SIGMOIDOSCOPY  03/02/2021    Procedure: Lonell Face;  Surgeon: Eloise Harman, DO;  [facility]: [facility];  Service: Endoscopy;;    SOCIAL HISTORY:  Social History   Socioeconomic History   Marital status: Married    Spouse name: Not on file   Number of children: Not on file   Years of education: Not on file   Highest education level: Not on file  Occupational History   Not on file  Tobacco Use   Smoking status: Never   Smokeless tobacco: Never  Vaping Use   Vaping Use: Never used  Substance and Sexual Activity   Alcohol use: No   Drug use: No   Sexual activity: Not on file  Other Topics Concern   Not on file  Social History Narrative   Not on file   Social Determinants of Health   Financial Resource Strain: High Risk (05/19/2021)   Overall Financial Resource Strain (CARDIA)    Difficulty of Paying Living Expenses: Hard  Food Insecurity: Not on file  Transportation Needs: Not on file  Physical Activity: Not on file  Stress: Not on file  Social Connections: Not on file  Intimate Partner Violence: Not on file    FAMILY HISTORY:  Family History  Problem Relation Age of Onset   Hypertension Mother    Hypertension Father    Colon cancer Neg Hx    Colon polyps Neg Hx    Inflammatory bowel disease Neg Hx     CURRENT MEDICATIONS:  Current Outpatient Medications  Medication Sig Dispense Refill   doxycycline (VIBRA-TABS) 100 MG tablet Take 1 tablet (100 mg total) by mouth 2 (two) times daily. 90 tablet 3   ferrous sulfate 325 (65 FE) MG tablet TAKE 325 MG BY MOUTH DAILY. (Patient taking differently: 325 mg daily with breakfast.) 90 tablet 1   fexofenadine (ALLEGRA) 180 MG tablet Take 1 tablet (180 mg total) by mouth daily. 30 tablet 0   fluorouracil CALGB 32671 2,400 mg/m2 in sodium chloride 0.9 % 150 mL Inject 2,400 mg/m2 into the vein over 48 hr. Every 14 days     FLUOROURACIL IV Inject into the vein every 14 (fourteen) days.     fluticasone (FLONASE) 50 MCG/ACT nasal spray Place 2 sprays  into both nostrils daily. 16 g 12   hydrocortisone 1 % lotion Apply 1 application. topically 2 (two) times daily. 118 mL 5   KLOR-CON M20 20 MEQ tablet TAKE 3 TABLETS (60 MEQ TOTAL) BY MOUTH DAILY FOR 1 DAY, THEN 1 TABLET (20 MEQ TOTAL) DAILY. 33 tablet 0   lidocaine (XYLOCAINE) 2 % solution Use as directed 10 mLs in the mouth or throat 4 (four) times daily as needed for mouth pain (Mix 1:1 with carafate and swish and spit). 100 mL ML   magnesium oxide (MAG-OX) 400 (240 Mg) MG tablet Take 1 tablet (400 mg total) by mouth 2 (two) times daily. 60 tablet 6   medroxyPROGESTERone (DEPO-PROVERA) 150 MG/ML injection Inject 150 mg into the muscle every 3 (three) months.     medroxyPROGESTERone (PROVERA) 5 MG tablet Take 5 mg by mouth every other day.     megestrol (MEGACE) 400 MG/10ML suspension Take 10 mLs (400 mg total) by mouth 2 (two) times  daily. 240 mL 2   Multiple Vitamin (MULTIVITAMIN) tablet Take 1 tablet by mouth daily.     nebivolol (BYSTOLIC) 10 MG tablet Take 1 tablet (10 mg total) by mouth daily. 30 tablet 1   OXALIPLATIN IV Inject into the vein every 14 (fourteen) days.     Panitumumab (VECTIBIX IV) Inject into the vein every 14 (fourteen) days.     pantoprazole (PROTONIX) 40 MG tablet Take 40 mg by mouth daily as needed (acid reflux).     potassium chloride (KLOR-CON) 20 MEQ packet Take 60 mEq by mouth 3 (three) times daily.     sucralfate (CARAFATE) 1 GM/10ML suspension Take 10 mLs (1 g total) by mouth 4 (four) times daily as needed. Mix 1:1 with 2% lidocaine viscous solution and swish and spit 420 mL 6   No current facility-administered medications for this visit.    ALLERGIES:  Allergies  Allergen Reactions   Azithromycin Hives    infusing arm began to swell , her face got red, and she broke out, there are bumps around where the IV site was infusing   Motrin [Ibuprofen] Itching   Lisinopril     cough   Sudafed [Pseudoephedrine Hcl] Other (See Comments)    Hypertension     PHYSICAL EXAM:  Performance status (ECOG): 1 - Symptomatic but completely ambulatory  There were no vitals filed for this visit. Wt Readings from Last 3 Encounters:  07/13/21 116 lb 9.6 oz (52.9 kg)  07/08/21 119 lb 4.8 oz (54.1 kg)  06/29/21 125 lb 12.8 oz (57.1 kg)   Physical Exam Vitals reviewed.  Constitutional:      Appearance: Normal appearance.  Cardiovascular:     Rate and Rhythm: Normal rate and regular rhythm.     Pulses: Normal pulses.     Heart sounds: Normal heart sounds.  Pulmonary:     Effort: Pulmonary effort is normal.     Breath sounds: Normal breath sounds.  Neurological:     General: No focal deficit present.     Mental Status: She is alert and oriented to person, place, and time.  Psychiatric:        Mood and Affect: Mood normal.        Behavior: Behavior normal.     LABORATORY DATA:  I have reviewed the labs as listed.     Latest Ref Rng & Units 07/13/2021    8:13 AM 06/29/2021    8:20 AM 06/15/2021    8:13 AM  CBC  WBC 4.0 - 10.5 K/uL 9.4  7.8  9.2   Hemoglobin 12.0 - 15.0 g/dL 13.9  11.8  11.3   Hematocrit 36.0 - 46.0 % 41.1  36.3  34.2   Platelets 150 - 400 K/uL 237  318  210       Latest Ref Rng & Units 07/13/2021    8:13 AM 06/29/2021    8:20 AM 06/15/2021    8:13 AM  CMP  Glucose 70 - 99 mg/dL 124  110  126   BUN 6 - 20 mg/dL $Remove'18  8  5   'Hbrbvko$ Creatinine 0.44 - 1.00 mg/dL 1.19  0.75  0.61   Sodium 135 - 145 mmol/L 137  138  143   Potassium 3.5 - 5.1 mmol/L 2.5  2.8  2.4   Chloride 98 - 111 mmol/L 109  111  113   CO2 22 - 32 mmol/L $RemoveB'19  20  23   'VsACzgLq$ Calcium 8.9 - 10.3 mg/dL 9.1  8.8  8.8   Total Protein 6.5 - 8.1 g/dL 7.6  7.6  7.0   Total Bilirubin 0.3 - 1.2 mg/dL 0.7  <0.1  0.5   Alkaline Phos 38 - 126 U/L 92  81  78   AST 15 - 41 U/L 81  30  17   ALT 0 - 44 U/L 206  70  27     DIAGNOSTIC IMAGING:  I have independently reviewed the scans and discussed with the patient. No results found.   ASSESSMENT:  PT4PN2B N1C stage IV sigmoid colon  cancer, NRAS negative: - Colonoscopy on 03/02/2021: Nonbleeding internal hemorrhoids, severe stenosis in the sigmoid colon at approximately 18 cm from anal verge, unable to be traversed with pediatric colonoscopy.  Biopsy consistent with moderately to poorly differentiated adenocarcinoma. - CTAP with contrast on 01/10/2021: Diffusely thickened, 10 cm segment of distal sigmoid colon with adjacent moderate inflammatory reaction, suspected 2 sites of small bowel fistulization to the diseased segment and a rim-enhancing low-density 2.5 x 1.8 cm stricture medial to the mid segment which could represent a necrotic lymph node or contained perforation/abscess.  There are bilateral mildly enlarged common iliac chain nodes.  Mildly dilated mid to lower abdominal small bowel segments.  Mildly prominent slightly steatotic liver. - Sigmoid colon resection, partial small bowel resection and Hartman's procedure on 03/26/2021: Moderately to poorly differentiated adenocarcinoma involving the subserosal adipose tissue and present at the margin.  9/9 lymph nodes positive for metastatic carcinoma.  Small bowel resection is positive for adenocarcinoma nodules in the serosal fat.  PT4PN2B.  MMR is preserved. - Operative report mentions obvious extension of the tumor outside the colon to the pelvic brim, unable to fully excise all of the tumor in the pelvis. - NGS testing: KRAS/NRAS negative, BRAF negative, HER2 negative, MSI-stable - PET scan on 04/29/2021: Multifocal hypermetabolic nodule, peritoneal and anterior abdominal wall adenopathy.  Findings worrisome for peritoneal carcinomatosis.  No evidence of hepatic metastatic disease or distant mets. - FOLFOXIRI and Vectibix started on 05/17/2021 - We will see how she responds and will refer to Dr. Crisoforo Oxford for resection/HIPEC therapy.    Social/family history: - She lives at home with her family.  She works as a Quarry manager at [employer].  Non-smoker. - Paternal cousin had cancer.   Maternal aunt had cancer.   PLAN:  Stage IV sigmoid colon adenocarcinoma, MSI-stable, RAS/BRAF negative: - She has tolerated fourth cycle very well. - Denies any tingling or numbness in extremities.  Overall she is feeling better other than hair loss. - Reviewed labs today which showed elevated AST and ALT with normal bilirubin.  CBC was grossly normal.  Last CEA has improved to 16.2 on 07/13/2021. - Recommend proceeding with cycle 5 today.  She has a CT scan scheduled in 10 days.  I will see her back in 2 weeks for follow-up. - I will make a referral to Dr. Crisoforo Oxford at [facility] for his opinion regarding resection of the peritoneal metastatic disease/HIPEC therapy in this young patient.  2.  Hypokalemia: - Continue potassium 20 mEq 3 times daily.  Potassium today is 3.1.  3.  Weight loss: - Continue boost with ice cream shakes 2-3 times daily.  She is continuing to gain weight.  4.  Microcytic anemia: - Continue iron tablet daily.  Hemoglobin today is 10.3.  5.  Acneform rash: - Avoid direct sunlight and use sunscreen. - Continue doxycycline twice daily.  Acneform rash has improved.  Use hydrocortisone cream as needed.  Orders placed this encounter:  No orders of the defined types were placed in this encounter.    Derek Jack, MD [place] [phone]   I, Thana Ates, am acting as a scribe for Dr. Derek Jack.  I, Derek Jack MD, have reviewed the above documentation for accuracy and completeness, and I agree with the above.

## 2021-07-27 NOTE — Progress Notes (Signed)
Chaplain continues to follow-up with Diana Fox, offering reflective listening and support.  She celebrated that she has gained weight since her last visit and was able to get treatment today.  She is thankful to have an appetite.    Sharlize continues to depend on her faith and her children through difficult times.     07/27/21 1300  Clinical Encounter Type  Visited With Patient  Visit Type Follow-up

## 2021-07-27 NOTE — Patient Instructions (Signed)
[facility]  Discharge Instructions: Thank you for choosing [facility] to provide your oncology and hematology care.  If you have a lab appointment with the [facility], please come in thru the Main Entrance and check in at the main information desk.  Wear comfortable clothing and clothing appropriate for easy access to any Portacath or PICC line.   We strive to give you quality time with your provider. You may need to reschedule your appointment if you arrive late (15 or more minutes).  Arriving late affects you and other patients whose appointments are after yours.  Also, if you miss three or more appointments without notifying the office, you may be dismissed from the clinic at the provider's discretion.      For prescription refill requests, have your pharmacy contact our office and allow 72 hours for refills to be completed.    Today you received the following chemotherapy and/or immunotherapy agents Vectibix and Folfoxiri   To help prevent nausea and vomiting after your treatment, we encourage you to take your nausea medication as directed.  BELOW ARE SYMPTOMS THAT SHOULD BE REPORTED IMMEDIATELY: *FEVER GREATER THAN 100.4 F (38 C) OR HIGHER *CHILLS OR SWEATING *NAUSEA AND VOMITING THAT IS NOT CONTROLLED WITH YOUR NAUSEA MEDICATION *UNUSUAL SHORTNESS OF BREATH *UNUSUAL BRUISING OR BLEEDING *URINARY PROBLEMS (pain or burning when urinating, or frequent urination) *BOWEL PROBLEMS (unusual diarrhea, constipation, pain near the anus) TENDERNESS IN MOUTH AND THROAT WITH OR WITHOUT PRESENCE OF ULCERS (sore throat, sores in mouth, or a toothache) UNUSUAL RASH, SWELLING OR PAIN  UNUSUAL VAGINAL DISCHARGE OR ITCHING   Items with * indicate a potential emergency and should be followed up as soon as possible or go to the Emergency Department if any problems should occur.  Please show the CHEMOTHERAPY ALERT CARD or IMMUNOTHERAPY ALERT CARD at check-in to the  Emergency Department and triage nurse.  Should you have questions after your visit or need to cancel or reschedule your appointment, please contact [facility] [phone]  and follow the prompts.  Office hours are 8:00 a.m. to 4:30 p.m. Monday - Friday. Please note that voicemails left after 4:00 p.m. may not be returned until the following business day.  We are closed weekends and major holidays. You have access to a nurse at all times for urgent questions. Please call the main number to the clinic [phone] and follow the prompts.  For any non-urgent questions, you may also contact your provider using MyChart. We now offer e-Visits for anyone 51 and older to request care online for non-urgent symptoms. For details visit [email or web address].   Also download the MyChart app! Go to the app store, search "MyChart", open the app, select [facility], and log in with your MyChart username and password.  Masks are optional in the cancer centers. If you would like for your care team to wear a mask while they are taking care of you, please let them know. For doctor visits, patients may have with them one support person who is at least 51 years old. At this time, visitors are not allowed in the infusion area. Panitumumab Solution for Injection What is this medication? PANITUMUMAB (pan i TOOM ue mab) is a monoclonal antibody. It is used to treat colorectal cancer. This medicine may be used for other purposes; ask your health care provider or pharmacist if you have questions. COMMON BRAND NAME(S): Vectibix What should I tell my care team before I take this medication? They  need to know if you have any of these conditions: eye disease, vision problems low levels of calcium, magnesium, or potassium in the blood lung or breathing disease, like asthma skin conditions or sensitivity an unusual or allergic reaction to panitumumab, other medicines, foods, dyes, or preservatives pregnant or  trying to get pregnant breast-feeding How should I use this medication? This drug is given as an infusion into a vein. It is administered in a hospital or clinic by a specially trained health care professional. Talk to your pediatrician regarding the use of this medicine in children. Special care may be needed. Overdosage: If you think you have taken too much of this medicine contact a poison control center or emergency room at once. NOTE: This medicine is only for you. Do not share this medicine with others. What if I miss a dose? It is important not to miss your dose. Call your doctor or health care professional if you are unable to keep an appointment. What may interact with this medication? Do not take this medicine with any of the following medications: bevacizumab This list may not describe all possible interactions. Give your health care provider a list of all the medicines, herbs, non-prescription drugs, or dietary supplements you use. Also tell them if you smoke, drink alcohol, or use illegal drugs. Some items may interact with your medicine. What should I watch for while using this medication? Visit your doctor for checks on your progress. This drug may make you feel generally unwell. This is not uncommon, as chemotherapy can affect healthy cells as well as cancer cells. Report any side effects. Continue your course of treatment even though you feel ill unless your doctor tells you to stop. This medicine can make you more sensitive to the sun. Keep out of the sun while receiving this medicine and for 2 months after the last dose. If you cannot avoid being in the sun, wear protective clothing and use sunscreen. Do not use sun lamps or tanning beds/booths. In some cases, you may be given additional medicines to help with side effects. Follow all directions for their use. Call your doctor or health care professional for advice if you get a fever, chills or sore throat, or other symptoms of a  cold or flu. Do not treat yourself. This drug decreases your body's ability to fight infections. Try to avoid being around people who are sick. Avoid taking products that contain aspirin, acetaminophen, ibuprofen, naproxen, or ketoprofen unless instructed by your doctor. These medicines may hide a fever. Do not become pregnant while taking this medicine and for 2 months after the last dose. Women should inform their doctor if they wish to become pregnant or think they might be pregnant. There is a potential for serious side effects to an unborn child. Talk to your health care professional or pharmacist for more information. Do not breast-feed an infant while taking this medicine or for 2 months after the last dose. What side effects may I notice from receiving this medication? Side effects that you should report to your doctor or health care professional as soon as possible: allergic reactions like skin rash, itching or hives, swelling of the face, lips, or tongue breathing problems changes in vision eye pain fast, irregular heartbeat fever, chills mouth sores red spots on the skin redness, blistering, peeling or loosening of the skin, including inside the mouth signs and symptoms of kidney injury like trouble passing urine or change in the amount of urine signs and symptoms of  low blood pressure like dizziness; feeling faint or lightheaded, falls; unusually weak or tired signs of low calcium like fast heartbeat, muscle cramps or muscle pain; pain, tingling, numbness in the hands or feet; seizures signs and symptoms of low magnesium like muscle cramps, pain, or weakness; tremors; seizures; or fast, irregular heartbeat signs and symptoms of low potassium like muscle cramps or muscle pain; chest pain; dizziness; feeling faint or lightheaded, falls; palpitations; breathing problems; or fast, irregular heartbeat swelling of the ankles, feet, hands Side effects that usually do not require medical  attention (report to your doctor or health care professional if they continue or are bothersome): changes in skin like acne, cracks, skin dryness diarrhea eyelash growth headache mouth sores nail changes nausea, vomiting This list may not describe all possible side effects. Call your doctor for medical advice about side effects. You may report side effects to FDA at 1-800-FDA-1088. Where should I keep my medication? This drug is given in a hospital or clinic and will not be stored at home. NOTE: This sheet is a summary. It may not cover all possible information. If you have questions about this medicine, talk to your doctor, pharmacist, or health care provider.  2023 Elsevier/Gold Standard (2015-08-21 00:00:00) Oxaliplatin Injection What is this medication? OXALIPLATIN (ox AL i PLA tin) is a chemotherapy drug. It targets fast dividing cells, like cancer cells, and causes these cells to die. This medicine is used to treat cancers of the colon and rectum, and many other cancers. This medicine may be used for other purposes; ask your health care provider or pharmacist if you have questions. COMMON BRAND NAME(S): Eloxatin What should I tell my care team before I take this medication? They need to know if you have any of these conditions: heart disease history of irregular heartbeat liver disease low blood counts, like white cells, platelets, or red blood cells lung or breathing disease, like asthma take medicines that treat or prevent blood clots tingling of the fingers or toes, or other nerve disorder an unusual or allergic reaction to oxaliplatin, other chemotherapy, other medicines, foods, dyes, or preservatives pregnant or trying to get pregnant breast-feeding How should I use this medication? This drug is given as an infusion into a vein. It is administered in a hospital or clinic by a specially trained health care professional. Talk to your pediatrician regarding the use of this  medicine in children. Special care may be needed. Overdosage: If you think you have taken too much of this medicine contact a poison control center or emergency room at once. NOTE: This medicine is only for you. Do not share this medicine with others. What if I miss a dose? It is important not to miss a dose. Call your doctor or health care professional if you are unable to keep an appointment. What may interact with this medication? Do not take this medicine with any of the following medications: cisapride dronedarone pimozide thioridazine This medicine may also interact with the following medications: aspirin and aspirin-like medicines certain medicines that treat or prevent blood clots like warfarin, apixaban, dabigatran, and rivaroxaban cisplatin cyclosporine diuretics medicines for infection like acyclovir, adefovir, amphotericin B, bacitracin, cidofovir, foscarnet, ganciclovir, gentamicin, pentamidine, vancomycin NSAIDs, medicines for pain and inflammation, like ibuprofen or naproxen other medicines that prolong the QT interval (an abnormal heart rhythm) pamidronate zoledronic acid This list may not describe all possible interactions. Give your health care provider a list of all the medicines, herbs, non-prescription drugs, or dietary supplements you use.  Also tell them if you smoke, drink alcohol, or use illegal drugs. Some items may interact with your medicine. What should I watch for while using this medication? Your condition will be monitored carefully while you are receiving this medicine. You may need blood work done while you are taking this medicine. This medicine may make you feel generally unwell. This is not uncommon as chemotherapy can affect healthy cells as well as cancer cells. Report any side effects. Continue your course of treatment even though you feel ill unless your healthcare professional tells you to stop. This medicine can make you more sensitive to cold. Do  not drink cold drinks or use ice. Cover exposed skin before coming in contact with cold temperatures or cold objects. When out in cold weather wear warm clothing and cover your mouth and nose to warm the air that goes into your lungs. Tell your doctor if you get sensitive to the cold. Do not become pregnant while taking this medicine or for 9 months after stopping it. Women should inform their health care professional if they wish to become pregnant or think they might be pregnant. Men should not father a child while taking this medicine and for 6 months after stopping it. There is potential for serious side effects to an unborn child. Talk to your health care professional for more information. Do not breast-feed a child while taking this medicine or for 3 months after stopping it. This medicine has caused ovarian failure in some women. This medicine may make it more difficult to get pregnant. Talk to your health care professional if you are concerned about your fertility. This medicine has caused decreased sperm counts in some men. This may make it more difficult to father a child. Talk to your health care professional if you are concerned about your fertility. This medicine may increase your risk of getting an infection. Call your health care professional for advice if you get a fever, chills, or sore throat, or other symptoms of a cold or flu. Do not treat yourself. Try to avoid being around people who are sick. Avoid taking medicines that contain aspirin, acetaminophen, ibuprofen, naproxen, or ketoprofen unless instructed by your health care professional. These medicines may hide a fever. Be careful brushing or flossing your teeth or using a toothpick because you may get an infection or bleed more easily. If you have any dental work done, tell your dentist you are receiving this medicine. What side effects may I notice from receiving this medication? Side effects that you should report to your doctor or  health care professional as soon as possible: allergic reactions like skin rash, itching or hives, swelling of the face, lips, or tongue breathing problems cough low blood counts - this medicine may decrease the number of white blood cells, red blood cells, and platelets. You may be at increased risk for infections and bleeding nausea, vomiting pain, redness, or irritation at site where injected pain, tingling, numbness in the hands or feet signs and symptoms of bleeding such as bloody or black, tarry stools; red or dark brown urine; spitting up blood or brown material that looks like coffee grounds; red spots on the skin; unusual bruising or bleeding from the eyes, gums, or nose signs and symptoms of a dangerous change in heartbeat or heart rhythm like chest pain; dizziness; fast, irregular heartbeat; palpitations; feeling faint or lightheaded; falls signs and symptoms of infection like fever; chills; cough; sore throat; pain or trouble passing urine signs and symptoms  of liver injury like dark yellow or brown urine; general ill feeling or flu-like symptoms; light-colored stools; loss of appetite; nausea; right upper belly pain; unusually weak or tired; yellowing of the eyes or skin signs and symptoms of low red blood cells or anemia such as unusually weak or tired; feeling faint or lightheaded; falls signs and symptoms of muscle injury like dark urine; trouble passing urine or change in the amount of urine; unusually weak or tired; muscle pain; back pain Side effects that usually do not require medical attention (report to your doctor or health care professional if they continue or are bothersome): changes in taste diarrhea gas hair loss loss of appetite mouth sores This list may not describe all possible side effects. Call your doctor for medical advice about side effects. You may report side effects to FDA at 1-800-FDA-1088. Where should I keep my medication? This drug is given in a  hospital or clinic and will not be stored at home. NOTE: This sheet is a summary. It may not cover all possible information. If you have questions about this medicine, talk to your doctor, pharmacist, or health care provider.  2023 Elsevier/Gold Standard (2020-12-25 00:00:00) Irinotecan injection What is this medication? IRINOTECAN (ir in oh TEE kan ) is a chemotherapy drug. It is used to treat colon and rectal cancer. This medicine may be used for other purposes; ask your health care provider or pharmacist if you have questions. COMMON BRAND NAME(S): Camptosar What should I tell my care team before I take this medication? They need to know if you have any of these conditions: dehydration diarrhea infection (especially a virus infection such as chickenpox, cold sores, or herpes) liver disease low blood counts, like low white cell, platelet, or red cell counts low levels of calcium, magnesium, or potassium in the blood recent or ongoing radiation therapy an unusual or allergic reaction to irinotecan, other medicines, foods, dyes, or preservatives pregnant or trying to get pregnant breast-feeding How should I use this medication? This drug is given as an infusion into a vein. It is administered in a hospital or clinic by a specially trained health care professional. Talk to your pediatrician regarding the use of this medicine in children. Special care may be needed. Overdosage: If you think you have taken too much of this medicine contact a poison control center or emergency room at once. NOTE: This medicine is only for you. Do not share this medicine with others. What if I miss a dose? It is important not to miss your dose. Call your doctor or health care professional if you are unable to keep an appointment. What may interact with this medication? Do not take this medicine with any of the following medications: cobicistat itraconazole This medicine may interact with the following  medications: antiviral medicines for HIV or AIDS certain antibiotics like rifampin or rifabutin certain medicines for fungal infections like ketoconazole, posaconazole, and voriconazole certain medicines for seizures like carbamazepine, phenobarbital, phenotoin clarithromycin gemfibrozil nefazodone St. John's Wort This list may not describe all possible interactions. Give your health care provider a list of all the medicines, herbs, non-prescription drugs, or dietary supplements you use. Also tell them if you smoke, drink alcohol, or use illegal drugs. Some items may interact with your medicine. What should I watch for while using this medication? Your condition will be monitored carefully while you are receiving this medicine. You will need important blood work done while you are taking this medicine. This drug may make you  feel generally unwell. This is not uncommon, as chemotherapy can affect healthy cells as well as cancer cells. Report any side effects. Continue your course of treatment even though you feel ill unless your doctor tells you to stop. In some cases, you may be given additional medicines to help with side effects. Follow all directions for their use. You may get drowsy or dizzy. Do not drive, use machinery, or do anything that needs mental alertness until you know how this medicine affects you. Do not stand or sit up quickly, especially if you are an older patient. This reduces the risk of dizzy or fainting spells. Call your health care professional for advice if you get a fever, chills, or sore throat, or other symptoms of a cold or flu. Do not treat yourself. This medicine decreases your body's ability to fight infections. Try to avoid being around people who are sick. Avoid taking products that contain aspirin, acetaminophen, ibuprofen, naproxen, or ketoprofen unless instructed by your doctor. These medicines may hide a fever. This medicine may increase your risk to bruise or  bleed. Call your doctor or health care professional if you notice any unusual bleeding. Be careful brushing and flossing your teeth or using a toothpick because you may get an infection or bleed more easily. If you have any dental work done, tell your dentist you are receiving this medicine. Do not become pregnant while taking this medicine or for 6 months after stopping it. Women should inform their health care professional if they wish to become pregnant or think they might be pregnant. Men should not father a child while taking this medicine and for 3 months after stopping it. There is potential for serious side effects to an unborn child. Talk to your health care professional for more information. Do not breast-feed an infant while taking this medicine or for 7 days after stopping it. This medicine has caused ovarian failure in some women. This medicine may make it more difficult to get pregnant. Talk to your health care professional if you are concerned about your fertility. This medicine has caused decreased sperm counts in some men. This may make it more difficult to father a child. Talk to your health care professional if you are concerned about your fertility. What side effects may I notice from receiving this medication? Side effects that you should report to your doctor or health care professional as soon as possible: allergic reactions like skin rash, itching or hives, swelling of the face, lips, or tongue chest pain diarrhea flushing, runny nose, sweating during infusion low blood counts - this medicine may decrease the number of white blood cells, red blood cells and platelets. You may be at increased risk for infections and bleeding. nausea, vomiting pain, swelling, warmth in the leg signs of decreased platelets or bleeding - bruising, pinpoint red spots on the skin, black, tarry stools, blood in the urine signs of infection - fever or chills, cough, sore throat, pain or difficulty  passing urine signs of decreased red blood cells - unusually weak or tired, fainting spells, lightheadedness Side effects that usually do not require medical attention (report to your doctor or health care professional if they continue or are bothersome): constipation hair loss headache loss of appetite mouth sores stomach pain This list may not describe all possible side effects. Call your doctor for medical advice about side effects. You may report side effects to FDA at 1-800-FDA-1088. Where should I keep my medication? This drug is given in a  hospital or clinic and will not be stored at home. NOTE: This sheet is a summary. It may not cover all possible information. If you have questions about this medicine, talk to your doctor, pharmacist, or health care provider.  2023 Elsevier/Gold Standard (2020-12-25 00:00:00) Leucovorin injection What is this medication? LEUCOVORIN (loo koe VOR in) is used to prevent or treat the harmful effects of some medicines. This medicine is used to treat anemia caused by a low amount of folic acid in the body. It is also used with 5-fluorouracil (5-FU) to treat colon cancer. This medicine may be used for other purposes; ask your health care provider or pharmacist if you have questions. What should I tell my care team before I take this medication? They need to know if you have any of these conditions: anemia from low levels of vitamin B-12 in the blood an unusual or allergic reaction to leucovorin, folic acid, other medicines, foods, dyes, or preservatives pregnant or trying to get pregnant breast-feeding How should I use this medication? This medicine is for injection into a muscle or into a vein. It is given by a health care professional in a hospital or clinic setting. Talk to your pediatrician regarding the use of this medicine in children. Special care may be needed. Overdosage: If you think you have taken too much of this medicine contact a poison  control center or emergency room at once. NOTE: This medicine is only for you. Do not share this medicine with others. What if I miss a dose? This does not apply. What may interact with this medication? capecitabine fluorouracil phenobarbital phenytoin primidone trimethoprim-sulfamethoxazole This list may not describe all possible interactions. Give your health care provider a list of all the medicines, herbs, non-prescription drugs, or dietary supplements you use. Also tell them if you smoke, drink alcohol, or use illegal drugs. Some items may interact with your medicine. What should I watch for while using this medication? Your condition will be monitored carefully while you are receiving this medicine. This medicine may increase the side effects of 5-fluorouracil, 5-FU. Tell your doctor or health care professional if you have diarrhea or mouth sores that do not get better or that get worse. What side effects may I notice from receiving this medication? Side effects that you should report to your doctor or health care professional as soon as possible: allergic reactions like skin rash, itching or hives, swelling of the face, lips, or tongue breathing problems fever, infection mouth sores unusual bleeding or bruising unusually weak or tired Side effects that usually do not require medical attention (report to your doctor or health care professional if they continue or are bothersome): constipation or diarrhea loss of appetite nausea, vomiting This list may not describe all possible side effects. Call your doctor for medical advice about side effects. You may report side effects to FDA at 1-800-FDA-1088. Where should I keep my medication? This drug is given in a hospital or clinic and will not be stored at home. NOTE: This sheet is a summary. It may not cover all possible information. If you have questions about this medicine, talk to your doctor, pharmacist, or health care provider.   2023 Elsevier/Gold Standard (2007-08-02 00:00:00)  Fluorouracil, 5FU; Diclofenac topical cream What is this medication? FLUOROURACIL; DICLOFENAC (flure oh YOOR a sil; dye KLOE fen ak) is a combination of a topical chemotherapy agent and non-steroidal anti-inflammatory drug (NSAID). It is used on the skin to treat skin cancer and skin conditions that could  become cancer. This medicine may be used for other purposes; ask your health care provider or pharmacist if you have questions. COMMON BRAND NAME(S): FLUORAC What should I tell my care team before I take this medication? They need to know if you have any of these conditions: bleeding problems cigarette smoker DPD enzyme deficiency heart disease high blood pressure if you frequently drink alcohol containing drinks kidney disease liver disease open or infected skin stomach problems swelling or open sores at the treatment site recent or planned coronary artery bypass graft (CABG) surgery an unusual or allergic reaction to fluorouracil, diclofenac, aspirin, other NSAIDs, other medicines, foods, dyes, or preservatives pregnant or trying to get pregnant breast-feeding How should I use this medication? This medicine is only for use on the skin. Follow the directions on the prescription label. Wash hands before and after use. Wash affected area and gently pat dry. To apply this medicine use a cotton-tipped applicator, or use gloves if applying with fingertips. If applied with unprotected fingertips, it is very important to wash your hands well after you apply this medicine. Avoid applying to the eyes, nose, or mouth. Apply enough medicine to cover the affected area. You can cover the area with a light gauze dressing, but do not use tight or air-tight dressings. Finish the full course prescribed by your doctor or health care professional, even if you think your condition is better. Do not stop taking except on the advice of your doctor or health  care professional. Talk to your pediatrician regarding the use of this medicine in children. Special care may be needed. Overdosage: If you think you have taken too much of this medicine contact a poison control center or emergency room at once. NOTE: This medicine is only for you. Do not share this medicine with others. What if I miss a dose? If you miss a dose, apply it as soon as you can. If it is almost time for your next dose, only use that dose. Do not apply extra doses. Contact your doctor or health care professional if you miss more than one dose. What may interact with this medication? Interactions are not expected. Do not use any other skin products without telling your doctor or health care professional. This list may not describe all possible interactions. Give your health care provider a list of all the medicines, herbs, non-prescription drugs, or dietary supplements you use. Also tell them if you smoke, drink alcohol, or use illegal drugs. Some items may interact with your medicine. What should I watch for while using this medication? Visit your doctor or healthcare provider for checks on your progress. You will need to use this medicine for 2 to 6 weeks. This may be longer depending on the condition being treated. You may not see full healing for another 1 to 2 months after you stop using the medicine. This medicine may cause serious skin reactions. They can happen weeks to months after starting the medicine. Contact your healthcare provider right away if you notice fevers or flu-like symptoms with a rash. The rash may be red or purple and then turn into blisters or peeling of the skin. Or, you might notice a red rash with swelling of the face, lips or lymph nodes in your neck or under your arms. Treated areas of skin can look unsightly during and for several weeks after treatment with this medicine. This medicine can make you more sensitive to the sun. Keep out of the sun. If you cannot  avoid being in the sun, wear protective clothing and use sunscreen. Do not use sun lamps or tanning beds/booths. Serious side effects or death can occur if a pet comes into contact with this drug. Contact a vet right away if a pet touches or licks the drug on your skin or comes into contact with the container. Throw away or wash any items used to apply this drug. Wash your hands after applying the drug. Make sure the drug does not get on clothing, carpet, or furniture. If you cannot avoid skin to skin contact with your pet, ask your health care provider if you can cover the area(s) where you apply this drug. Do not become pregnant while taking this medicine. Women should inform their doctor if they wish to become pregnant or think they might be pregnant. There is a potential for serious side effects to an unborn child. Talk to your healthcare provider or pharmacist for more information. What side effects may I notice from receiving this medication? Side effects that you should report to your doctor or health care professional as soon as possible: allergic reactions like skin rash, itching or hives, swelling of the face, lips, or tongue black or bloody stools, blood in the urine or vomit blurred vision chest pain difficulty breathing or wheezing rash, fever, and swollen lymph nodes redness, blistering, peeling or loosening of the skin, including inside the mouth severe redness and swelling of normal skin slurred speech or weakness on one side of the body trouble passing urine or change in the amount of urine unexplained weight gain or swelling unusually weak or tired yellowing of eyes or skin Side effects that usually do not require medical attention (report to your doctor or health care professional if they continue or are bothersome): increased sensitivity of the skin to sun and ultraviolet light pain and burning of the affected area scaling or swelling of the affected area skin rash, itching  of the affected area tenderness This list may not describe all possible side effects. Call your doctor for medical advice about side effects. You may report side effects to FDA at 1-800-FDA-1088. Where should I keep my medication? Keep out of the reach of children and pets. Store at room temperature between 20 and 25 degrees C (68 and 77 degrees F). Throw away any unused medicine after the expiration date. NOTE: This sheet is a summary. It may not cover all possible information. If you have questions about this medicine, talk to your doctor, pharmacist, or health care provider.  2023 Elsevier/Gold Standard (2020-12-25 00:00:00)

## 2021-07-27 NOTE — Progress Notes (Unsigned)
Referral to Dr Crisoforo Oxford at [facility].  Office willl call pt directly for apt

## 2021-07-29 ENCOUNTER — Inpatient Hospital Stay (HOSPITAL_COMMUNITY): Payer: Medicaid Other

## 2021-07-29 VITALS — BP 110/69 | HR 80 | Temp 97.9°F | Resp 18

## 2021-07-29 DIAGNOSIS — C189 Malignant neoplasm of colon, unspecified: Secondary | ICD-10-CM

## 2021-07-29 DIAGNOSIS — Z5111 Encounter for antineoplastic chemotherapy: Secondary | ICD-10-CM | POA: Diagnosis not present

## 2021-07-29 DIAGNOSIS — Z95828 Presence of other vascular implants and grafts: Secondary | ICD-10-CM

## 2021-07-29 MED ORDER — HEPARIN SOD (PORK) LOCK FLUSH 100 UNIT/ML IV SOLN
500.0000 [IU] | Freq: Once | INTRAVENOUS | Status: AC | PRN
  Administered 2021-07-29: 500 [IU]

## 2021-07-29 MED ORDER — PEGFILGRASTIM-CBQV 6 MG/0.6ML ~~LOC~~ SOSY
6.0000 mg | PREFILLED_SYRINGE | Freq: Once | SUBCUTANEOUS | Status: AC
  Administered 2021-07-29: 6 mg via SUBCUTANEOUS
  Filled 2021-07-29: qty 0.6

## 2021-07-29 MED ORDER — SODIUM CHLORIDE 0.9% FLUSH
10.0000 mL | INTRAVENOUS | Status: DC | PRN
  Administered 2021-07-29: 10 mL

## 2021-07-29 NOTE — Progress Notes (Signed)
Patient presents today for home infusion 5FU pump discontinuation.  Patient is in satisfactory condition with no complaints voiced.  Vital signs are stable.  Patient tolerated injection with no complaints voiced.  Site clean and dry with no bruising or swelling noted.  No complaints of pain.  Discharged with vital signs stable and no signs or symptoms of distress noted.

## 2021-07-29 NOTE — Patient Instructions (Signed)
[facility]  Discharge Instructions: Thank you for choosing [facility] to provide your oncology and hematology care.  If you have a lab appointment with the Cancer Center, please come in thru the Main Entrance and check in at the main information desk.  Wear comfortable clothing and clothing appropriate for easy access to any Portacath or PICC line.   We strive to give you quality time with your provider. You may need to reschedule your appointment if you arrive late (15 or more minutes).  Arriving late affects you and other patients whose appointments are after yours.  Also, if you miss three or more appointments without notifying the office, you may be dismissed from the clinic at the provider's discretion.      For prescription refill requests, have your pharmacy contact our office and allow 72 hours for refills to be completed.    Today you received the following chemotherapy and/or immunotherapy agents Udenyca.  Pegfilgrastim Injection What is this medication? PEGFILGRASTIM (PEG fil gra stim) lowers the risk of infection in people who are receiving chemotherapy. It works by helping your body make more white blood cells, which protects your body from infection. It may also be used to help people who have been exposed to high doses of radiation. This medicine may be used for other purposes; ask your health care provider or pharmacist if you have questions. COMMON BRAND NAME(S): Fulphila, Fylnetra, Neulasta, Nyvepria, Stimufend, UDENYCA, Ziextenzo What should I tell my care team before I take this medication? They need to know if you have any of these conditions: Kidney disease Latex allergy Ongoing radiation therapy Sickle cell disease Skin reactions to acrylic adhesives (On-Body Injector only) An unusual or allergic reaction to pegfilgrastim, filgrastim, other medications, foods, dyes, or preservatives Pregnant or trying to get pregnant Breast-feeding How  should I use this medication? This medication is for injection under the skin. If you get this medication at home, you will be taught how to prepare and give the pre-filled syringe or how to use the On-body Injector. Refer to the patient Instructions for Use for detailed instructions. Use exactly as directed. Tell your care team immediately if you suspect that the On-body Injector may not have performed as intended or if you suspect the use of the On-body Injector resulted in a missed or partial dose. It is important that you put your used needles and syringes in a special sharps container. Do not put them in a trash can. If you do not have a sharps container, call your pharmacist or care team to get one. Talk to your care team about the use of this medication in children. While this medication may be prescribed for selected conditions, precautions do apply. Overdosage: If you think you have taken too much of this medicine contact a poison control center or emergency room at once. NOTE: This medicine is only for you. Do not share this medicine with others. What if I miss a dose? It is important not to miss your dose. Call your care team if you miss your dose. If you miss a dose due to an On-body Injector failure or leakage, a new dose should be administered as soon as possible using a single prefilled syringe for manual use. What may interact with this medication? Interactions have not been studied. This list may not describe all possible interactions. Give your health care provider a list of all the medicines, herbs, non-prescription drugs, or dietary supplements you use. Also tell them   if you smoke, drink alcohol, or use illegal drugs. Some items may interact with your medicine. What should I watch for while using this medication? Your condition will be monitored carefully while you are receiving this medication. You may need blood work done while you are taking this medication. Talk to your care  team about your risk of cancer. You may be more at risk for certain types of cancer if you take this medication. If you are going to need a MRI, CT scan, or other procedure, tell your care team that you are using this medication (On-Body Injector only). What side effects may I notice from receiving this medication? Side effects that you should report to your care team as soon as possible: Allergic reactions--skin rash, itching, hives, swelling of the face, lips, tongue, or throat Capillary leak syndrome--stomach or muscle pain, unusual weakness or fatigue, feeling faint or lightheaded, decrease in the amount of urine, swelling of the ankles, hands, or feet, trouble breathing High white blood cell level--fever, fatigue, trouble breathing, night sweats, change in vision, weight loss Inflammation of the aorta--fever, fatigue, back, chest, or stomach pain, severe headache Kidney injury (glomerulonephritis)--decrease in the amount of urine, red or dark Sherylann Vangorden urine, foamy or bubbly urine, swelling of the ankles, hands, or feet Shortness of breath or trouble breathing Spleen injury--pain in upper left stomach or shoulder Unusual bruising or bleeding Side effects that usually do not require medical attention (report to your care team if they continue or are bothersome): Bone pain Pain in the hands or feet This list may not describe all possible side effects. Call your doctor for medical advice about side effects. You may report side effects to FDA at 1-800-FDA-1088. Where should I keep my medication? Keep out of the reach of children. If you are using this medication at home, you will be instructed on how to store it. Throw away any unused medication after the expiration date on the label. NOTE: This sheet is a summary. It may not cover all possible information. If you have questions about this medicine, talk to your doctor, pharmacist, or health care provider.  2023 Elsevier/Gold Standard (2020-12-25  00:00:00)        To help prevent nausea and vomiting after your treatment, we encourage you to take your nausea medication as directed.  BELOW ARE SYMPTOMS THAT SHOULD BE REPORTED IMMEDIATELY: *FEVER GREATER THAN 100.4 F (38 C) OR HIGHER *CHILLS OR SWEATING *NAUSEA AND VOMITING THAT IS NOT CONTROLLED WITH YOUR NAUSEA MEDICATION *UNUSUAL SHORTNESS OF BREATH *UNUSUAL BRUISING OR BLEEDING *URINARY PROBLEMS (pain or burning when urinating, or frequent urination) *BOWEL PROBLEMS (unusual diarrhea, constipation, pain near the anus) TENDERNESS IN MOUTH AND THROAT WITH OR WITHOUT PRESENCE OF ULCERS (sore throat, sores in mouth, or a toothache) UNUSUAL RASH, SWELLING OR PAIN  UNUSUAL VAGINAL DISCHARGE OR ITCHING   Items with * indicate a potential emergency and should be followed up as soon as possible or go to the Emergency Department if any problems should occur.  Please show the CHEMOTHERAPY ALERT CARD or IMMUNOTHERAPY ALERT CARD at check-in to the Emergency Department and triage nurse.  Should you have questions after your visit or need to cancel or reschedule your appointment, please contact [facility] [phone]  and follow the prompts.  Office hours are 8:00 a.m. to 4:30 p.m. Monday - Friday. Please note that voicemails left after 4:00 p.m. may not be returned until the following business day.  We are closed weekends and major holidays. You   have access to a nurse at all times for urgent questions. Please call the main number to the clinic [phone] and follow the prompts.  For any non-urgent questions, you may also contact your provider using MyChart. We now offer e-Visits for anyone 18 and older to request care online for non-urgent symptoms. For details visit [email or web address].   Also download the MyChart app! Go to the app store, search "MyChart", open the app, select [facility], and log in with your MyChart username and password.  Masks are optional in the  cancer centers. If you would like for your care team to wear a mask while they are taking care of you, please let them know. For doctor visits, patients may have with them one support person who is at least 51 years old. At this time, visitors are not allowed in the infusion area.  

## 2021-07-30 ENCOUNTER — Other Ambulatory Visit (HOSPITAL_COMMUNITY): Payer: Self-pay | Admitting: Physician Assistant

## 2021-07-30 DIAGNOSIS — E876 Hypokalemia: Secondary | ICD-10-CM

## 2021-08-02 ENCOUNTER — Encounter (HOSPITAL_COMMUNITY): Payer: Self-pay

## 2021-08-02 ENCOUNTER — Telehealth: Payer: Self-pay | Admitting: *Deleted

## 2021-08-02 DIAGNOSIS — C187 Malignant neoplasm of sigmoid colon: Secondary | ICD-10-CM

## 2021-08-02 NOTE — Telephone Encounter (Signed)
Spoke with pt. She informed me of her July schedule for chemo. She stated she has an appointment for July 5  that is a regular chemo appointment and July 7 she is having the pump removed.

## 2021-08-03 MED ORDER — PEG 3350-KCL-NA BICARB-NACL 420 G PO SOLR: ORAL | 0 refills | Status: DC

## 2021-08-03 NOTE — Telephone Encounter (Signed)
Noted  

## 2021-08-05 ENCOUNTER — Ambulatory Visit (HOSPITAL_COMMUNITY): Payer: Medicaid Other

## 2021-08-11 ENCOUNTER — Inpatient Hospital Stay (HOSPITAL_COMMUNITY): Payer: Medicaid Other

## 2021-08-11 ENCOUNTER — Inpatient Hospital Stay (HOSPITAL_COMMUNITY): Payer: Medicaid Other | Admitting: Hematology

## 2021-08-13 ENCOUNTER — Encounter (HOSPITAL_COMMUNITY): Payer: Medicaid Other

## 2021-08-16 ENCOUNTER — Ambulatory Visit (HOSPITAL_COMMUNITY)
Admission: RE | Admit: 2021-08-16 | Discharge: 2021-08-16 | Disposition: A | Discharge status: Home / Self Care | Payer: Medicaid Other | Source: Ambulatory Visit | Attending: Hematology | Admitting: Hematology

## 2021-08-16 DIAGNOSIS — C189 Malignant neoplasm of colon, unspecified: Secondary | ICD-10-CM | POA: Diagnosis not present

## 2021-08-16 MED ORDER — IOHEXOL 300 MG/ML  SOLN
100.0000 mL | Freq: Once | INTRAMUSCULAR | Status: AC | PRN
  Administered 2021-08-16: 100 mL via INTRAVENOUS

## 2021-08-19 ENCOUNTER — Telehealth: Payer: Self-pay | Admitting: *Deleted

## 2021-08-19 NOTE — Telephone Encounter (Signed)
Spoke with pt. She needed to r/s procedure for tomorrow. She has been rescheduled to 8/4 at 2:45pm. Message sent ot endo. New instructions mailed. New rx sent for prep.

## 2021-08-23 ENCOUNTER — Inpatient Hospital Stay (HOSPITAL_BASED_OUTPATIENT_CLINIC_OR_DEPARTMENT_OTHER): Payer: Medicaid Other | Admitting: Hematology

## 2021-08-23 ENCOUNTER — Inpatient Hospital Stay (HOSPITAL_COMMUNITY): Payer: Medicaid Other

## 2021-08-23 ENCOUNTER — Inpatient Hospital Stay (HOSPITAL_COMMUNITY): Payer: Medicaid Other | Attending: Hematology

## 2021-08-23 VITALS — BP 150/87 | HR 72 | Temp 98.1°F | Resp 18

## 2021-08-23 DIAGNOSIS — C187 Malignant neoplasm of sigmoid colon: Secondary | ICD-10-CM | POA: Diagnosis not present

## 2021-08-23 DIAGNOSIS — R21 Rash and other nonspecific skin eruption: Secondary | ICD-10-CM | POA: Diagnosis not present

## 2021-08-23 DIAGNOSIS — R634 Abnormal weight loss: Secondary | ICD-10-CM | POA: Insufficient documentation

## 2021-08-23 DIAGNOSIS — Z793 Long term (current) use of hormonal contraceptives: Secondary | ICD-10-CM | POA: Diagnosis not present

## 2021-08-23 DIAGNOSIS — Z95828 Presence of other vascular implants and grafts: Secondary | ICD-10-CM

## 2021-08-23 DIAGNOSIS — I10 Essential (primary) hypertension: Secondary | ICD-10-CM | POA: Diagnosis not present

## 2021-08-23 DIAGNOSIS — E876 Hypokalemia: Secondary | ICD-10-CM | POA: Diagnosis not present

## 2021-08-23 DIAGNOSIS — Z79899 Other long term (current) drug therapy: Secondary | ICD-10-CM | POA: Diagnosis not present

## 2021-08-23 DIAGNOSIS — D509 Iron deficiency anemia, unspecified: Secondary | ICD-10-CM | POA: Insufficient documentation

## 2021-08-23 DIAGNOSIS — Z5111 Encounter for antineoplastic chemotherapy: Secondary | ICD-10-CM | POA: Insufficient documentation

## 2021-08-23 DIAGNOSIS — C189 Malignant neoplasm of colon, unspecified: Secondary | ICD-10-CM

## 2021-08-23 LAB — COMPREHENSIVE METABOLIC PANEL
ALT: 31 U/L (ref 0–44)
AST: 24 U/L (ref 15–41)
Albumin: 3.5 g/dL (ref 3.5–5.0)
Alkaline Phosphatase: 51 U/L (ref 38–126)
Anion gap: 6 (ref 5–15)
BUN: 8 mg/dL (ref 6–20)
CO2: 25 mmol/L (ref 22–32)
Calcium: 8.7 mg/dL — ABNORMAL LOW (ref 8.9–10.3)
Chloride: 110 mmol/L (ref 98–111)
Creatinine, Ser: 0.6 mg/dL (ref 0.44–1.00)
GFR, Estimated: >60 mL/min (ref >60)
Glucose, Bld: 102 mg/dL — ABNORMAL HIGH (ref 70–99)
Potassium: 3.1 mmol/L — ABNORMAL LOW (ref 3.5–5.1)
Sodium: 141 mmol/L (ref 135–145)
Total Bilirubin: 0.6 mg/dL (ref 0.3–1.2)
Total Protein: 6.6 g/dL (ref 6.5–8.1)

## 2021-08-23 LAB — CBC WITH DIFFERENTIAL/PLATELET
Abs Immature Granulocytes: 0.02 10*3/uL (ref 0.00–0.07)
Basophils Absolute: 0.1 10*3/uL (ref 0.0–0.1)
Basophils Relative: 1 %
Eosinophils Absolute: 0.1 10*3/uL (ref 0.0–0.5)
Eosinophils Relative: 2 %
HCT: 32.6 % — ABNORMAL LOW (ref 36.0–46.0)
Hemoglobin: 10.9 g/dL — ABNORMAL LOW (ref 12.0–15.0)
Immature Granulocytes: 0 %
Lymphocytes Relative: 53 %
Lymphs Abs: 2.7 10*3/uL (ref 0.7–4.0)
MCH: 29.9 pg (ref 26.0–34.0)
MCHC: 33.4 g/dL (ref 30.0–36.0)
MCV: 89.6 fL (ref 80.0–100.0)
Monocytes Absolute: 0.6 10*3/uL (ref 0.1–1.0)
Monocytes Relative: 12 %
Neutro Abs: 1.7 10*3/uL (ref 1.7–7.7)
Neutrophils Relative %: 32 %
Platelets: 259 10*3/uL (ref 150–400)
RBC: 3.64 MIL/uL — ABNORMAL LOW (ref 3.87–5.11)
RDW: 19.1 % — ABNORMAL HIGH (ref 11.5–15.5)
WBC: 5.2 10*3/uL (ref 4.0–10.5)
nRBC: 0 % (ref 0.0–0.2)

## 2021-08-23 LAB — MAGNESIUM: Magnesium: 1.8 mg/dL (ref 1.7–2.4)

## 2021-08-23 MED ORDER — PALONOSETRON HCL INJECTION 0.25 MG/5ML
0.2500 mg | Freq: Once | INTRAVENOUS | Status: AC
  Administered 2021-08-23: 0.25 mg via INTRAVENOUS
  Filled 2021-08-23: qty 5

## 2021-08-23 MED ORDER — SODIUM CHLORIDE 0.9 % IV SOLN
150.0000 mg | Freq: Once | INTRAVENOUS | Status: AC
  Administered 2021-08-23: 150 mg via INTRAVENOUS
  Filled 2021-08-23: qty 150

## 2021-08-23 MED ORDER — SODIUM CHLORIDE 0.9 % IV SOLN
6.2000 mg/kg | Freq: Once | INTRAVENOUS | Status: AC
  Administered 2021-08-23: 400 mg via INTRAVENOUS
  Filled 2021-08-23: qty 20

## 2021-08-23 MED ORDER — SODIUM CHLORIDE 0.9 % IV SOLN
10.0000 mg | Freq: Once | INTRAVENOUS | Status: AC
  Administered 2021-08-23: 10 mg via INTRAVENOUS
  Filled 2021-08-23: qty 10

## 2021-08-23 MED ORDER — LEUCOVORIN CALCIUM INJECTION 350 MG
200.0000 mg/m2 | Freq: Once | INTRAVENOUS | Status: AC
  Administered 2021-08-23: 332 mg via INTRAVENOUS
  Filled 2021-08-23: qty 16.6

## 2021-08-23 MED ORDER — SODIUM CHLORIDE 0.9 % IV SOLN: Freq: Once | INTRAVENOUS | Status: AC

## 2021-08-23 MED ORDER — ATROPINE SULFATE 1 MG/ML IV SOLN
0.5000 mg | Freq: Once | INTRAVENOUS | Status: AC
  Administered 2021-08-23: 0.5 mg via INTRAVENOUS
  Filled 2021-08-23: qty 1

## 2021-08-23 MED ORDER — SODIUM CHLORIDE 0.9 % IV SOLN
165.0000 mg/m2 | Freq: Once | INTRAVENOUS | Status: AC
  Administered 2021-08-23: 280 mg via INTRAVENOUS
  Filled 2021-08-23: qty 10

## 2021-08-23 MED ORDER — OXALIPLATIN CHEMO INJECTION 100 MG/20ML
85.0000 mg/m2 | Freq: Once | INTRAVENOUS | Status: AC
  Administered 2021-08-23: 140 mg via INTRAVENOUS
  Filled 2021-08-23: qty 20

## 2021-08-23 MED ORDER — DEXTROSE 5 % IV SOLN: Freq: Once | INTRAVENOUS | Status: AC

## 2021-08-23 MED ORDER — SODIUM CHLORIDE 0.9% FLUSH
10.0000 mL | INTRAVENOUS | Status: DC | PRN
  Administered 2021-08-23 (×2): 10 mL

## 2021-08-23 MED ORDER — SODIUM CHLORIDE 0.9 % IV SOLN
2400.0000 mg/m2 | INTRAVENOUS | Status: DC
  Administered 2021-08-23: 4000 mg via INTRAVENOUS
  Filled 2021-08-23: qty 80

## 2021-08-23 MED ORDER — POTASSIUM CHLORIDE CRYS ER 20 MEQ PO TBCR: 20.0000 meq | EXTENDED_RELEASE_TABLET | Freq: Four times a day (QID) | ORAL | 3 refills | Status: DC

## 2021-08-23 NOTE — Progress Notes (Signed)
Dose = 6 mg/kg going to 400 mg to round to vial size for waste prevention and weight improvement.  Henreitta Leber, PharmD

## 2021-08-23 NOTE — Patient Instructions (Signed)
[facility]  Discharge Instructions: Thank you for choosing [facility] to provide your oncology and hematology care.  If you have a lab appointment with the [facility], please come in thru the Main Entrance and check in at the main information desk.  Wear comfortable clothing and clothing appropriate for easy access to any Portacath or PICC line.   We strive to give you quality time with your provider. You may need to reschedule your appointment if you arrive late (15 or more minutes).  Arriving late affects you and other patients whose appointments are after yours.  Also, if you miss three or more appointments without notifying the office, you may be dismissed from the clinic at the provider's discretion.      For prescription refill requests, have your pharmacy contact our office and allow 72 hours for refills to be completed.    Today you received the following chemotherapy and/or immunotherapy agents FLFOXIRI, return as scheduled. Leucovorin injection What is this medication? LEUCOVORIN (loo koe VOR in) is used to prevent or treat the harmful effects of some medicines. This medicine is used to treat anemia caused by a low amount of folic acid in the body. It is also used with 5-fluorouracil (5-FU) to treat colon cancer. This medicine may be used for other purposes; ask your health care provider or pharmacist if you have questions. What should I tell my care team before I take this medication? They need to know if you have any of these conditions: anemia from low levels of vitamin B-12 in the blood an unusual or allergic reaction to leucovorin, folic acid, other medicines, foods, dyes, or preservatives pregnant or trying to get pregnant breast-feeding How should I use this medication? This medicine is for injection into a muscle or into a vein. It is given by a health care professional in a hospital or clinic setting. Talk to your pediatrician regarding the  use of this medicine in children. Special care may be needed. Overdosage: If you think you have taken too much of this medicine contact a poison control center or emergency room at once. NOTE: This medicine is only for you. Do not share this medicine with others. What if I miss a dose? This does not apply. What may interact with this medication? capecitabine fluorouracil phenobarbital phenytoin primidone trimethoprim-sulfamethoxazole This list may not describe all possible interactions. Give your health care provider a list of all the medicines, herbs, non-prescription drugs, or dietary supplements you use. Also tell them if you smoke, drink alcohol, or use illegal drugs. Some items may interact with your medicine. What should I watch for while using this medication? Your condition will be monitored carefully while you are receiving this medicine. This medicine may increase the side effects of 5-fluorouracil, 5-FU. Tell your doctor or health care professional if you have diarrhea or mouth sores that do not get better or that get worse. What side effects may I notice from receiving this medication? Side effects that you should report to your doctor or health care professional as soon as possible: allergic reactions like skin rash, itching or hives, swelling of the face, lips, or tongue breathing problems fever, infection mouth sores unusual bleeding or bruising unusually weak or tired Side effects that usually do not require medical attention (report to your doctor or health care professional if they continue or are bothersome): constipation or diarrhea loss of appetite nausea, vomiting This list may not describe all possible side effects. Call your  doctor for medical advice about side effects. You may report side effects to FDA at 1-800-FDA-1088. Where should I keep my medication? This drug is given in a hospital or clinic and will not be stored at home. NOTE: This sheet is a summary.  It may not cover all possible information. If you have questions about this medicine, talk to your doctor, pharmacist, or health care provider.  2023 Elsevier/Gold Standard (2007-08-02 00:00:00) Fluorouracil, 5-FU injection What is this medication? FLUOROURACIL, 5-FU (flure oh YOOR a sil) is a chemotherapy drug. It slows the growth of cancer cells. This medicine is used to treat many types of cancer like breast cancer, colon or rectal cancer, pancreatic cancer, and stomach cancer. This medicine may be used for other purposes; ask your health care provider or pharmacist if you have questions. COMMON BRAND NAME(S): Adrucil What should I tell my care team before I take this medication? They need to know if you have any of these conditions: blood disorders dihydropyrimidine dehydrogenase (DPD) deficiency infection (especially a virus infection such as chickenpox, cold sores, or herpes) kidney disease liver disease malnourished, poor nutrition recent or ongoing radiation therapy an unusual or allergic reaction to fluorouracil, other chemotherapy, other medicines, foods, dyes, or preservatives pregnant or trying to get pregnant breast-feeding How should I use this medication? This drug is given as an infusion or injection into a vein. It is administered in a hospital or clinic by a specially trained health care professional. Talk to your pediatrician regarding the use of this medicine in children. Special care may be needed. Overdosage: If you think you have taken too much of this medicine contact a poison control center or emergency room at once. NOTE: This medicine is only for you. Do not share this medicine with others. What if I miss a dose? It is important not to miss your dose. Call your doctor or health care professional if you are unable to keep an appointment. What may interact with this medication? Do not take this medicine with any of the following medications: live virus  vaccines This medicine may also interact with the following medications: medicines that treat or prevent blood clots like warfarin, enoxaparin, and dalteparin This list may not describe all possible interactions. Give your health care provider a list of all the medicines, herbs, non-prescription drugs, or dietary supplements you use. Also tell them if you smoke, drink alcohol, or use illegal drugs. Some items may interact with your medicine. What should I watch for while using this medication? Visit your doctor for checks on your progress. This drug may make you feel generally unwell. This is not uncommon, as chemotherapy can affect healthy cells as well as cancer cells. Report any side effects. Continue your course of treatment even though you feel ill unless your doctor tells you to stop. In some cases, you may be given additional medicines to help with side effects. Follow all directions for their use. Call your doctor or health care professional for advice if you get a fever, chills or sore throat, or other symptoms of a cold or flu. Do not treat yourself. This drug decreases your body's ability to fight infections. Try to avoid being around people who are sick. This medicine may increase your risk to bruise or bleed. Call your doctor or health care professional if you notice any unusual bleeding. Be careful brushing and flossing your teeth or using a toothpick because you may get an infection or bleed more easily. If you have any dental  work done, tell your dentist you are receiving this medicine. Avoid taking products that contain aspirin, acetaminophen, ibuprofen, naproxen, or ketoprofen unless instructed by your doctor. These medicines may hide a fever. Do not become pregnant while taking this medicine. Women should inform their doctor if they wish to become pregnant or think they might be pregnant. There is a potential for serious side effects to an unborn child. Talk to your health care  professional or pharmacist for more information. Do not breast-feed an infant while taking this medicine. Men should inform their doctor if they wish to father a child. This medicine may lower sperm counts. Do not treat diarrhea with over the counter products. Contact your doctor if you have diarrhea that lasts more than 2 days or if it is severe and watery. This medicine can make you more sensitive to the sun. Keep out of the sun. If you cannot avoid being in the sun, wear protective clothing and use sunscreen. Do not use sun lamps or tanning beds/booths. What side effects may I notice from receiving this medication? Side effects that you should report to your doctor or health care professional as soon as possible: allergic reactions like skin rash, itching or hives, swelling of the face, lips, or tongue low blood counts - this medicine may decrease the number of white blood cells, red blood cells and platelets. You may be at increased risk for infections and bleeding. signs of infection - fever or chills, cough, sore throat, pain or difficulty passing urine signs of decreased platelets or bleeding - bruising, pinpoint red spots on the skin, black, tarry stools, blood in the urine signs of decreased red blood cells - unusually weak or tired, fainting spells, lightheadedness breathing problems changes in vision chest pain mouth sores nausea and vomiting pain, swelling, redness at site where injected pain, tingling, numbness in the hands or feet redness, swelling, or sores on hands or feet stomach pain unusual bleeding Side effects that usually do not require medical attention (report to your doctor or health care professional if they continue or are bothersome): changes in finger or toe nails diarrhea dry or itchy skin hair loss headache loss of appetite sensitivity of eyes to the light stomach upset unusually teary eyes This list may not describe all possible side effects. Call your  doctor for medical advice about side effects. You may report side effects to FDA at 1-800-FDA-1088. Where should I keep my medication? This drug is given in a hospital or clinic and will not be stored at home. NOTE: This sheet is a summary. It may not cover all possible information. If you have questions about this medicine, talk to your doctor, pharmacist, or health care provider.  2023 Elsevier/Gold Standard (2020-12-25 00:00:00) Oxaliplatin Injection What is this medication? OXALIPLATIN (ox AL i PLA tin) is a chemotherapy drug. It targets fast dividing cells, like cancer cells, and causes these cells to die. This medicine is used to treat cancers of the colon and rectum, and many other cancers. This medicine may be used for other purposes; ask your health care provider or pharmacist if you have questions. COMMON BRAND NAME(S): Eloxatin What should I tell my care team before I take this medication? They need to know if you have any of these conditions: heart disease history of irregular heartbeat liver disease low blood counts, like white cells, platelets, or red blood cells lung or breathing disease, like asthma take medicines that treat or prevent blood clots tingling of the fingers or  toes, or other nerve disorder an unusual or allergic reaction to oxaliplatin, other chemotherapy, other medicines, foods, dyes, or preservatives pregnant or trying to get pregnant breast-feeding How should I use this medication? This drug is given as an infusion into a vein. It is administered in a hospital or clinic by a specially trained health care professional. Talk to your pediatrician regarding the use of this medicine in children. Special care may be needed. Overdosage: If you think you have taken too much of this medicine contact a poison control center or emergency room at once. NOTE: This medicine is only for you. Do not share this medicine with others. What if I miss a dose? It is important  not to miss a dose. Call your doctor or health care professional if you are unable to keep an appointment. What may interact with this medication? Do not take this medicine with any of the following medications: cisapride dronedarone pimozide thioridazine This medicine may also interact with the following medications: aspirin and aspirin-like medicines certain medicines that treat or prevent blood clots like warfarin, apixaban, dabigatran, and rivaroxaban cisplatin cyclosporine diuretics medicines for infection like acyclovir, adefovir, amphotericin B, bacitracin, cidofovir, foscarnet, ganciclovir, gentamicin, pentamidine, vancomycin NSAIDs, medicines for pain and inflammation, like ibuprofen or naproxen other medicines that prolong the QT interval (an abnormal heart rhythm) pamidronate zoledronic acid This list may not describe all possible interactions. Give your health care provider a list of all the medicines, herbs, non-prescription drugs, or dietary supplements you use. Also tell them if you smoke, drink alcohol, or use illegal drugs. Some items may interact with your medicine. What should I watch for while using this medication? Your condition will be monitored carefully while you are receiving this medicine. You may need blood work done while you are taking this medicine. This medicine may make you feel generally unwell. This is not uncommon as chemotherapy can affect healthy cells as well as cancer cells. Report any side effects. Continue your course of treatment even though you feel ill unless your healthcare professional tells you to stop. This medicine can make you more sensitive to cold. Do not drink cold drinks or use ice. Cover exposed skin before coming in contact with cold temperatures or cold objects. When out in cold weather wear warm clothing and cover your mouth and nose to warm the air that goes into your lungs. Tell your doctor if you get sensitive to the cold. Do not  become pregnant while taking this medicine or for 9 months after stopping it. Women should inform their health care professional if they wish to become pregnant or think they might be pregnant. Men should not father a child while taking this medicine and for 6 months after stopping it. There is potential for serious side effects to an unborn child. Talk to your health care professional for more information. Do not breast-feed a child while taking this medicine or for 3 months after stopping it. This medicine has caused ovarian failure in some women. This medicine may make it more difficult to get pregnant. Talk to your health care professional if you are concerned about your fertility. This medicine has caused decreased sperm counts in some men. This may make it more difficult to father a child. Talk to your health care professional if you are concerned about your fertility. This medicine may increase your risk of getting an infection. Call your health care professional for advice if you get a fever, chills, or sore throat, or other symptoms  of a cold or flu. Do not treat yourself. Try to avoid being around people who are sick. Avoid taking medicines that contain aspirin, acetaminophen, ibuprofen, naproxen, or ketoprofen unless instructed by your health care professional. These medicines may hide a fever. Be careful brushing or flossing your teeth or using a toothpick because you may get an infection or bleed more easily. If you have any dental work done, tell your dentist you are receiving this medicine. What side effects may I notice from receiving this medication? Side effects that you should report to your doctor or health care professional as soon as possible: allergic reactions like skin rash, itching or hives, swelling of the face, lips, or tongue breathing problems cough low blood counts - this medicine may decrease the number of white blood cells, red blood cells, and platelets. You may be at  increased risk for infections and bleeding nausea, vomiting pain, redness, or irritation at site where injected pain, tingling, numbness in the hands or feet signs and symptoms of bleeding such as bloody or black, tarry stools; red or dark brown urine; spitting up blood or brown material that looks like coffee grounds; red spots on the skin; unusual bruising or bleeding from the eyes, gums, or nose signs and symptoms of a dangerous change in heartbeat or heart rhythm like chest pain; dizziness; fast, irregular heartbeat; palpitations; feeling faint or lightheaded; falls signs and symptoms of infection like fever; chills; cough; sore throat; pain or trouble passing urine signs and symptoms of liver injury like dark yellow or brown urine; general ill feeling or flu-like symptoms; light-colored stools; loss of appetite; nausea; right upper belly pain; unusually weak or tired; yellowing of the eyes or skin signs and symptoms of low red blood cells or anemia such as unusually weak or tired; feeling faint or lightheaded; falls signs and symptoms of muscle injury like dark urine; trouble passing urine or change in the amount of urine; unusually weak or tired; muscle pain; back pain Side effects that usually do not require medical attention (report to your doctor or health care professional if they continue or are bothersome): changes in taste diarrhea gas hair loss loss of appetite mouth sores This list may not describe all possible side effects. Call your doctor for medical advice about side effects. You may report side effects to FDA at 1-800-FDA-1088. Where should I keep my medication? This drug is given in a hospital or clinic and will not be stored at home. NOTE: This sheet is a summary. It may not cover all possible information. If you have questions about this medicine, talk to your doctor, pharmacist, or health care provider.  2023 Elsevier/Gold Standard (2020-12-25 00:00:00)  Panitumumab  Solution for Injection What is this medication? PANITUMUMAB (pan i TOOM ue mab) is a monoclonal antibody. It is used to treat colorectal cancer. This medicine may be used for other purposes; ask your health care provider or pharmacist if you have questions. COMMON BRAND NAME(S): Vectibix What should I tell my care team before I take this medication? They need to know if you have any of these conditions: eye disease, vision problems low levels of calcium, magnesium, or potassium in the blood lung or breathing disease, like asthma skin conditions or sensitivity an unusual or allergic reaction to panitumumab, other medicines, foods, dyes, or preservatives pregnant or trying to get pregnant breast-feeding How should I use this medication? This drug is given as an infusion into a vein. It is administered in a hospital or  clinic by a specially trained health care professional. Talk to your pediatrician regarding the use of this medicine in children. Special care may be needed. Overdosage: If you think you have taken too much of this medicine contact a poison control center or emergency room at once. NOTE: This medicine is only for you. Do not share this medicine with others. What if I miss a dose? It is important not to miss your dose. Call your doctor or health care professional if you are unable to keep an appointment. What may interact with this medication? Do not take this medicine with any of the following medications: bevacizumab This list may not describe all possible interactions. Give your health care provider a list of all the medicines, herbs, non-prescription drugs, or dietary supplements you use. Also tell them if you smoke, drink alcohol, or use illegal drugs. Some items may interact with your medicine. What should I watch for while using this medication? Visit your doctor for checks on your progress. This drug may make you feel generally unwell. This is not uncommon, as  chemotherapy can affect healthy cells as well as cancer cells. Report any side effects. Continue your course of treatment even though you feel ill unless your doctor tells you to stop. This medicine can make you more sensitive to the sun. Keep out of the sun while receiving this medicine and for 2 months after the last dose. If you cannot avoid being in the sun, wear protective clothing and use sunscreen. Do not use sun lamps or tanning beds/booths. In some cases, you may be given additional medicines to help with side effects. Follow all directions for their use. Call your doctor or health care professional for advice if you get a fever, chills or sore throat, or other symptoms of a cold or flu. Do not treat yourself. This drug decreases your body's ability to fight infections. Try to avoid being around people who are sick. Avoid taking products that contain aspirin, acetaminophen, ibuprofen, naproxen, or ketoprofen unless instructed by your doctor. These medicines may hide a fever. Do not become pregnant while taking this medicine and for 2 months after the last dose. Women should inform their doctor if they wish to become pregnant or think they might be pregnant. There is a potential for serious side effects to an unborn child. Talk to your health care professional or pharmacist for more information. Do not breast-feed an infant while taking this medicine or for 2 months after the last dose. What side effects may I notice from receiving this medication? Side effects that you should report to your doctor or health care professional as soon as possible: allergic reactions like skin rash, itching or hives, swelling of the face, lips, or tongue breathing problems changes in vision eye pain fast, irregular heartbeat fever, chills mouth sores red spots on the skin redness, blistering, peeling or loosening of the skin, including inside the mouth signs and symptoms of kidney injury like trouble passing  urine or change in the amount of urine signs and symptoms of low blood pressure like dizziness; feeling faint or lightheaded, falls; unusually weak or tired signs of low calcium like fast heartbeat, muscle cramps or muscle pain; pain, tingling, numbness in the hands or feet; seizures signs and symptoms of low magnesium like muscle cramps, pain, or weakness; tremors; seizures; or fast, irregular heartbeat signs and symptoms of low potassium like muscle cramps or muscle pain; chest pain; dizziness; feeling faint or lightheaded, falls; palpitations; breathing problems; or  fast, irregular heartbeat swelling of the ankles, feet, hands Side effects that usually do not require medical attention (report to your doctor or health care professional if they continue or are bothersome): changes in skin like acne, cracks, skin dryness diarrhea eyelash growth headache mouth sores nail changes nausea, vomiting This list may not describe all possible side effects. Call your doctor for medical advice about side effects. You may report side effects to FDA at 1-800-FDA-1088. Where should I keep my medication? This drug is given in a hospital or clinic and will not be stored at home. NOTE: This sheet is a summary. It may not cover all possible information. If you have questions about this medicine, talk to your doctor, pharmacist, or health care provider.  2023 Elsevier/Gold Standard (2015-08-21 00:00:00)    To help prevent nausea and vomiting after your treatment, we encourage you to take your nausea medication as directed.  BELOW ARE SYMPTOMS THAT SHOULD BE REPORTED IMMEDIATELY: *FEVER GREATER THAN 100.4 F (38 C) OR HIGHER *CHILLS OR SWEATING *NAUSEA AND VOMITING THAT IS NOT CONTROLLED WITH YOUR NAUSEA MEDICATION *UNUSUAL SHORTNESS OF BREATH *UNUSUAL BRUISING OR BLEEDING *URINARY PROBLEMS (pain or burning when urinating, or frequent urination) *BOWEL PROBLEMS (unusual diarrhea, constipation, pain near  the anus) TENDERNESS IN MOUTH AND THROAT WITH OR WITHOUT PRESENCE OF ULCERS (sore throat, sores in mouth, or a toothache) UNUSUAL RASH, SWELLING OR PAIN  UNUSUAL VAGINAL DISCHARGE OR ITCHING   Items with * indicate a potential emergency and should be followed up as soon as possible or go to the Emergency Department if any problems should occur.  Please show the CHEMOTHERAPY ALERT CARD or IMMUNOTHERAPY ALERT CARD at check-in to the Emergency Department and triage nurse.  Should you have questions after your visit or need to cancel or reschedule your appointment, please contact [facility] [phone]  and follow the prompts.  Office hours are 8:00 a.m. to 4:30 p.m. Monday - Friday. Please note that voicemails left after 4:00 p.m. may not be returned until the following business day.  We are closed weekends and major holidays. You have access to a nurse at all times for urgent questions. Please call the main number to the clinic [phone] and follow the prompts.  For any non-urgent questions, you may also contact your provider using MyChart. We now offer e-Visits for anyone 54 and older to request care online for non-urgent symptoms. For details visit [email or web address].   Also download the MyChart app! Go to the app store, search "MyChart", open the app, select [facility], and log in with your MyChart username and password.  Masks are optional in the cancer centers. If you would like for your care team to wear a mask while they are taking care of you, please let them know. For doctor visits, patients may have with them one support person who is at least 51 years old. At this time, visitors are not allowed in the infusion area.

## 2021-08-23 NOTE — Progress Notes (Signed)
Patient tolerated chemotherapy with no complaints voiced. Side effects with management reviewed understanding verbalized. Port site clean and dry with no bruising or swelling noted at site. Good blood return noted before and after administration of chemotherapy. Chemo pump connected with no alarms noted. Patient left in satisfactory condition with VSS and no s/s of distress noted.  °

## 2021-08-23 NOTE — Patient Instructions (Addendum)
[place] at [facility] Discharge Instructions   You were seen and examined today by Dr. Delton Coombes.  He reviewed the results of your lab work and CT scan. Your lab work is normal and your CT scan is stable  We will proceed with your treatment today.   Return as scheduled.    Thank you for choosing [place] at [facility] to provide your oncology and hematology care.  To afford each patient quality time with our provider, please arrive at least 15 minutes before your scheduled appointment time.   If you have a lab appointment with the [facility] please come in thru the Main Entrance and check in at the main information desk.  You need to re-schedule your appointment should you arrive 10 or more minutes late.  We strive to give you quality time with our providers, and arriving late affects you and other patients whose appointments are after yours.  Also, if you no show three or more times for appointments you may be dismissed from the clinic at the providers discretion.     Again, thank you for choosing [facility].  Our hope is that these requests will decrease the amount of time that you wait before being seen by our physicians.       _____________________________________________________________  Should you have questions after your visit to [facility], please contact our office at [phone] and follow the prompts.  Our office hours are 8:00 a.m. and 4:30 p.m. Monday - Friday.  Please note that voicemails left after 4:00 p.m. may not be returned until the following business day.  We are closed weekends and major holidays.  You do have access to a nurse 24-7, just call the main number to the clinic [phone] and do not press any options, hold on the line and a nurse will answer the phone.    For prescription refill requests, have your pharmacy contact our office and allow 72 hours.    Due to Covid, you  will need to wear a mask upon entering the hospital. If you do not have a mask, a mask will be given to you at the Main Entrance upon arrival. For doctor visits, patients may have 1 support person age 24 or older with them. For treatment visits, patients can not have anyone with them due to social distancing guidelines and our immunocompromised population.

## 2021-08-23 NOTE — Progress Notes (Signed)
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  PRIOR THERAPY: Sigmoid colon resection, partial small bowel resection and Hartman's procedure on 03/26/2021  NGS Results: KRAS/NRAS negative, BRAF negative, HER2 negative, MSI-stable  CURRENT THERAPY: FOLFOXIRI + Panitumumab q14d  BRIEF ONCOLOGIC HISTORY:  Oncology History  Colon carcinoma ([place])  03/26/2021 Initial Diagnosis   Colon carcinoma ([place])   04/06/2021 Cancer Staging   Staging form: Colon and Rectum, AJCC 8th Edition - Clinical stage from 04/06/2021: Stage IVC (cT4a, cN2b, pM1c) - Signed by Derek Jack, MD on 05/04/2021 Histopathologic type: Adenocarcinoma, NOS Stage prefix: Initial diagnosis Total positive nodes: 9 Total nodes examined: 9 Tumor deposits (TD): Present Carcinoembryonic antigen (CEA) (ng/mL): 61.5 Perineural invasion (PNI): Present Microsatellite instability (MSI): Stable KRAS mutation: Negative NRAS mutation: Negative BRAF mutation: Negative   05/17/2021 -  Chemotherapy   Patient is on Treatment Plan : COLORECTAL FOLFOXIRI + Panitumumab q14d       CANCER STAGING:  Cancer Staging  Colon carcinoma [facility]) Staging form: Colon and Rectum, AJCC 8th Edition - Clinical stage from 04/06/2021: Stage IVC (cT4a, cN2b, pM1c) - Signed by Derek Jack, MD on 05/04/2021   INTERVAL HISTORY:  [name], a 51 y.o. female, returns for routine follow-up and consideration for next cycle of chemotherapy. Diana Fox was last seen on 07/27/2021.  Due for cycle #6 of FOLFOXIRI + Panitumumab today.   Overall, she tells me she has been feeling pretty well. She received Depo injections. She has gained 5 lbs since her last visit. She denies tingling/numbness. She is taking  potassium 4 times daily. She is no longer taking doxycycline. She is avoiding sun exposure.   Overall, she feels ready for next cycle of chemo today.    REVIEW OF SYSTEMS:  Review of Systems  Constitutional:  Negative for appetite change and fatigue.  Neurological:  Negative for numbness.  All other systems reviewed and are negative.   PAST MEDICAL/SURGICAL HISTORY:  Past Medical History:  Diagnosis Date   Anemia    Back pain    Hypertension    Port-A-Cath in place 05/10/2021   Past Surgical History:  Procedure Laterality Date   BIOPSY  03/02/2021   Procedure: BIOPSY;  Surgeon: Eloise Harman, DO;  [facility];  Service: Endoscopy;;   BOWEL RESECTION N/A 03/26/2021   Procedure: SMALL BOWEL RESECTION;  Surgeon: Aviva Signs, MD;  Location: [facility] OR[facility];  Service: General;  Laterality: N/A;   CHOLECYSTECTOMY     COLOSTOMY N/A 03/26/2021   Procedure: COLOSTOMY;  Surgeon: Aviva Signs, MD;  Location: [facility];  Service: General;  Laterality: N/A;   ESOPHAGOGASTRODUODENOSCOPY (EGD) WITH PROPOFOL N/A 03/02/2021   Procedure: ESOPHAGOGASTRODUODENOSCOPY (EGD) WITH PROPOFOL;  Surgeon: Eloise Harman, DO;  [facility];  Service: Endoscopy;  Laterality: N/A;   PARTIAL COLECTOMY N/A 03/26/2021   Procedure: PARTIAL COLECTOMY;  Surgeon: Aviva Signs, MD;  Location: [facility];  Service: General;  Laterality: N/A;   PORTACATH PLACEMENT Left 04/14/2021   Procedure: INSERTION PORT-A-CATH;  Surgeon: Aviva Signs, MD;  Location: [facility];  Service: General;  Laterality: Left;   SCLEROTHERAPY  03/02/2021   Procedure: Clide Deutscher;  Surgeon: Eloise Harman, DO;  [facility];  Service: Endoscopy;;   SIGMOIDOSCOPY  03/02/2021  Procedure: SIGMOIDOSCOPY;  Surgeon: Eloise Harman, DO;  [facility];  Service: Endoscopy;;    SOCIAL HISTORY:  Social History   Socioeconomic History   Marital status: Married    Spouse name: Not on file   Number of children: Not  on file   Years of education: Not on file   Highest education level: Not on file  Occupational History   Not on file  Tobacco Use   Smoking status: Never   Smokeless tobacco: Never  Vaping Use   Vaping Use: Never used  Substance and Sexual Activity   Alcohol use: No   Drug use: No   Sexual activity: Not on file  Other Topics Concern   Not on file  Social History Narrative   Not on file   Social Determinants of Health   Financial Resource Strain: High Risk (05/19/2021)   Overall Financial Resource Strain (CARDIA)    Difficulty of Paying Living Expenses: Hard  Food Insecurity: Not on file  Transportation Needs: Not on file  Physical Activity: Not on file  Stress: Not on file  Social Connections: Not on file  Intimate Partner Violence: Not on file    FAMILY HISTORY:  Family History  Problem Relation Age of Onset   Hypertension Mother    Hypertension Father    Colon cancer Neg Hx    Colon polyps Neg Hx    Inflammatory bowel disease Neg Hx     CURRENT MEDICATIONS:  Current Outpatient Medications  Medication Sig Dispense Refill   doxycycline (VIBRA-TABS) 100 MG tablet Take 1 tablet (100 mg total) by mouth 2 (two) times daily. 90 tablet 3   ferrous sulfate 325 (65 FE) MG tablet TAKE 325 MG BY MOUTH DAILY. (Patient taking differently: 325 mg daily with breakfast.) 90 tablet 1   fexofenadine (ALLEGRA) 180 MG tablet Take 1 tablet (180 mg total) by mouth daily. 30 tablet 0   fluorouracil CALGB 76151 2,400 mg/m2 in sodium chloride 0.9 % 150 mL Inject 2,400 mg/m2 into the vein over 48 hr. Every 14 days     FLUOROURACIL IV Inject into the vein every 14 (fourteen) days.     fluticasone (FLONASE) 50 MCG/ACT nasal spray Place 2 sprays into both nostrils daily. 16 g 12   hydrocortisone 1 % lotion Apply 1 application. topically 2 (two) times daily. 118 mL 5   lidocaine (XYLOCAINE) 2 % solution Use as directed 10 mLs in the mouth or throat 4 (four) times daily as needed for mouth pain  (Mix 1:1 with carafate and swish and spit). 100 mL ML   magnesium oxide (MAG-OX) 400 (240 Mg) MG tablet Take 1 tablet (400 mg total) by mouth 2 (two) times daily. 60 tablet 6   medroxyPROGESTERone (DEPO-PROVERA) 150 MG/ML injection Inject 150 mg into the muscle every 3 (three) months.     medroxyPROGESTERone (PROVERA) 5 MG tablet Take 5 mg by mouth every other day.     megestrol (MEGACE) 400 MG/10ML suspension Take 10 mLs (400 mg total) by mouth 2 (two) times daily. 240 mL 2   Multiple Vitamin (MULTIVITAMIN) tablet Take 1 tablet by mouth daily.     nebivolol (BYSTOLIC) 10 MG tablet Take 1 tablet (10 mg total) by mouth daily. 30 tablet 1   OXALIPLATIN IV Inject into the vein every 14 (fourteen) days.     Panitumumab (VECTIBIX IV) Inject into the vein every 14 (fourteen) days.     pantoprazole (PROTONIX) 40 MG tablet Take 40 mg by  mouth daily as needed (acid reflux).     polyethylene glycol-electrolytes (NULYTELY) 420 g solution As directed 4000 mL 0   potassium chloride (KLOR-CON) 20 MEQ packet Take 60 mEq by mouth 3 (three) times daily.     potassium chloride SA (KLOR-CON M20) 20 MEQ tablet Take 1 tablet (20 mEq total) by mouth 2 (two) times daily. 60 tablet 2   sucralfate (CARAFATE) 1 GM/10ML suspension Take 10 mLs (1 g total) by mouth 4 (four) times daily as needed. Mix 1:1 with 2% lidocaine viscous solution and swish and spit 420 mL 6   No current facility-administered medications for this visit.    ALLERGIES:  Allergies  Allergen Reactions   Zithromax [Azithromycin] Hives    infusing arm began to swell , her face got red, and she broke out, there are bumps around where the IV site was infusing   Motrin [Ibuprofen] Itching   Sudafed [Pseudoephedrine Hcl] Other (See Comments)    Hypertension   Zestril [Lisinopril] Cough    PHYSICAL EXAM:  Performance status (ECOG): 1 - Symptomatic but completely ambulatory  There were no vitals filed for this visit. Wt Readings from Last 3  Encounters:  07/27/21 132 lb 6.4 oz (60.1 kg)  07/13/21 116 lb 9.6 oz (52.9 kg)  07/08/21 119 lb 4.8 oz (54.1 kg)   Physical Exam Vitals reviewed.  Constitutional:      Appearance: Normal appearance.  Cardiovascular:     Rate and Rhythm: Normal rate and regular rhythm.     Pulses: Normal pulses.     Heart sounds: Normal heart sounds.  Pulmonary:     Effort: Pulmonary effort is normal.     Breath sounds: Normal breath sounds.  Neurological:     General: No focal deficit present.     Mental Status: She is alert and oriented to person, place, and time.  Psychiatric:        Mood and Affect: Mood normal.        Behavior: Behavior normal.     LABORATORY DATA:  I have reviewed the labs as listed.     Latest Ref Rng & Units 07/27/2021    8:22 AM 07/13/2021    8:13 AM 06/29/2021    8:20 AM  CBC  WBC 4.0 - 10.5 K/uL 7.9  9.4  7.8   Hemoglobin 12.0 - 15.0 g/dL 10.3  13.9  11.8   Hematocrit 36.0 - 46.0 % 31.0  41.1  36.3   Platelets 150 - 400 K/uL 201  237  318       Latest Ref Rng & Units 07/27/2021    8:22 AM 07/13/2021    8:13 AM 06/29/2021    8:20 AM  CMP  Glucose 70 - 99 mg/dL 100  124  110   BUN 6 - 20 mg/dL 6  18  8    Creatinine 0.44 - 1.00 mg/dL 0.65  1.19  0.75   Sodium 135 - 145 mmol/L 140  137  138   Potassium 3.5 - 5.1 mmol/L 3.1  2.5  2.8   Chloride 98 - 111 mmol/L 114  109  111   CO2 22 - 32 mmol/L 23  19  20    Calcium 8.9 - 10.3 mg/dL 8.3  9.1  8.8   Total Protein 6.5 - 8.1 g/dL 6.0  7.6  7.6   Total Bilirubin 0.3 - 1.2 mg/dL 0.3  0.7  <0.1   Alkaline Phos 38 - 126 U/L 41  92  81  AST 15 - 41 U/L 45  81  30   ALT 0 - 44 U/L 57  206  70     DIAGNOSTIC IMAGING:  I have independently reviewed the scans and discussed with the patient. CT CHEST ABDOMEN PELVIS W CONTRAST  Result Date: 08/17/2021 CLINICAL DATA:  51 year old female presents for for follow-up of colorectal neoplasm. * Tracking Code: BO * EXAM: CT CHEST, ABDOMEN, AND PELVIS WITH CONTRAST TECHNIQUE:  Multidetector CT imaging of the chest, abdomen and pelvis was performed following the standard protocol during bolus administration of intravenous contrast. RADIATION DOSE REDUCTION: This exam was performed according to the departmental dose-optimization program which includes automated exposure control, adjustment of the mA and/or kV according to patient size and/or use of iterative reconstruction technique. CONTRAST:  164m OMNIPAQUE IOHEXOL 300 MG/ML  SOLN COMPARISON:  April 29, 2021. FINDINGS: CT CHEST FINDINGS Cardiovascular: Normal caliber of the thoracic aorta. Normal three-vessel branching pattern. Heart size is normal without substantial pericardial effusion or nodularity. Central pulmonary vasculature is of normal caliber. LEFT-sided Port-A-Cath terminates in the mid superior vena cava, unchanged. Mediastinum/Nodes: No thoracic inlet, axillary, mediastinal or hilar adenopathy. Esophagus grossly normal. Lungs/Pleura: No effusion. No consolidation. No suspicious pulmonary nodule. Airways are patent. Musculoskeletal: See below for full musculoskeletal details. No sign of chest wall mass. CT ABDOMEN PELVIS FINDINGS Hepatobiliary: Post cholecystectomy with stable prominence of extrahepatic and intrahepatic biliary ducts. No focal, suspicious hepatic lesion. Portal vein is patent. Pancreas: Normal, without mass, inflammation or ductal dilatation. Spleen: Normal. Adrenals/Urinary Tract: Adrenal glands are normal. Symmetric renal enhancement without hydronephrosis. Urinary bladder without focal thickening. Mild perivesical stranding which is slightly improved compared to previous imaging and no longer associated with focal fluid adjacent to the urinary bladder. Stomach/Bowel: Post LAR with LEFT lower quadrant colostomy and rectal pouch. Small amount of fluid density and or stranding along the RIGHT lateral aspect of the uterus measuring 2.8 x 1.2 cm. Resolution of anterior abdominal fluid collection seen  previously otherwise since the prior study. This areas adjacent to the RIGHT adnexa which shows a stable appearance otherwise. Stomach without acute findings. No small bowel obstruction. Mild thickening of the terminal ileum just upstream from a small bowel anastomosis in the area of the terminal ileum is noted this is perhaps improved since previous imaging.: Into the colostomy without signs of inflammation or obstruction with moderate stool. Resolution of fluid and gas also seen previously at the apex of the rectal pouch. Vascular/Lymphatic: No signs of vascular dilation or acute vascular findings on venous phase. Resolution of most nodal tissue in the abdomen and pelvis seen on prior imaging. Lymph node in the sigmoid mesentery. (Image 73/2) 9 mm previously 15 mm and with indistinct boundaries No adenopathy by size criteria in the abdomen or pelvis. Reproductive: Fibroid uterus. Thickened endometrium with heterogeneity up to 16 mm in this patient who may be postmenopausal. More heterogeneous enhancement towards the fundus within the endometrium at approximately 17 mm greatest thickness. Other: No frank peritoneal nodularity or evidence of ascites. Musculoskeletal: No acute or significant osseous findings. IMPRESSION: 1. Post LAR with LEFT lower quadrant colostomy and rectal pouch. 2. Resolution of loculated fluid in the lower abdomen. Small amount of fluid or soft tissue density adjacent to RIGHT ovary appears non loculated. Suggest attention on follow-up. 3. Decreased size of dominant area of nodal tissue in the sigmoid mesentery is less distinct and dense than on previous imaging. No adenopathy by size criteria in the abdomen or pelvis. 4. No signs  of metastatic disease to solid organs or chest. 5. No definitive signs of peritoneal disease currently. Attention on follow-up. 6. Thickening of the endometrium with heterogeneity in this patient who may be postmenopausal. Consider ultrasound evaluation with gyn  consultation for endometrial sampling as warranted. These results will be called to the ordering clinician or representative by the Radiologist Assistant, and communication documented in the PACS or [employer]. Electronically Signed   By: Zetta Bills M.D.   On: 08/17/2021 17:15     ASSESSMENT:  PT4PN2B N1C stage IV sigmoid colon cancer, NRAS negative: - Colonoscopy on 03/02/2021: Nonbleeding internal hemorrhoids, severe stenosis in the sigmoid colon at approximately 18 cm from anal verge, unable to be traversed with pediatric colonoscopy.  Biopsy consistent with moderately to poorly differentiated adenocarcinoma. - CTAP with contrast on 01/10/2021: Diffusely thickened, 10 cm segment of distal sigmoid colon with adjacent moderate inflammatory reaction, suspected 2 sites of small bowel fistulization to the diseased segment and a rim-enhancing low-density 2.5 x 1.8 cm stricture medial to the mid segment which could represent a necrotic lymph node or contained perforation/abscess.  There are bilateral mildly enlarged common iliac chain nodes.  Mildly dilated mid to lower abdominal small bowel segments.  Mildly prominent slightly steatotic liver. - Sigmoid colon resection, partial small bowel resection and Hartman's procedure on 03/26/2021: Moderately to poorly differentiated adenocarcinoma involving the subserosal adipose tissue and present at the margin.  9/9 lymph nodes positive for metastatic carcinoma.  Small bowel resection is positive for adenocarcinoma nodules in the serosal fat.  PT4PN2B.  MMR is preserved. - Operative report mentions obvious extension of the tumor outside the colon to the pelvic brim, unable to fully excise all of the tumor in the pelvis. - NGS testing: KRAS/NRAS negative, BRAF negative, HER2 negative, MSI-stable - PET scan on 04/29/2021: Multifocal hypermetabolic nodule, peritoneal and anterior abdominal wall adenopathy.  Findings worrisome for peritoneal carcinomatosis.  No  evidence of hepatic metastatic disease or distant mets. - FOLFOXIRI and Vectibix started on 05/17/2021 - We will see how she responds and will refer to Dr. Crisoforo Oxford for resection/HIPEC therapy.    Social/family history: - She lives at home with her family.  She works as a Quarry manager at [employer].  Non-smoker. - Paternal cousin had cancer.  Maternal aunt had cancer.   PLAN:  Stage IV sigmoid colon adenocarcinoma, MSI-stable, RAS/BRAF negative: - She has missed cycle 6 of chemotherapy due to personal reasons. - Reviewed CT CAP (08/16/2021): Resolution of loculated fluid in the lower abdomen.  Decreased size of dominant area of nodal tissue in the sigmoid mesentery.  No signs of metastatic disease to solid organs or chest.  No definite signs of peritoneal disease currently.  Thickening of the endometrium. - She reports that she has been on Depo-Provera and does not have menses. - Tolerating chemotherapy very well.  Denies any tingling or numbness in extremities. - She will proceed with cycle 6 today and next treatment in 2 weeks.  RTC 4 weeks for follow-up with repeat labs. - Recommend consultation with Dr. Crisoforo Oxford with opinion regarding further surgical resection of metastatic disease.  2.  Hypokalemia: - Increase potassium to 20 mEq 4 times daily.  3.  Weight loss: - Continue boost with ice cream shakes 2-3 times daily.  Weight is stable.  4.  Microcytic anemia: - Continue iron tablet daily.  Hemoglobin is 10.9.  5.  Acneform rash: - Rash has improved.  She is not taking doxycycline.  She will start  back doxycycline if she gets rash again.   Orders placed this encounter:  No orders of the defined types were placed in this encounter.    Derek Jack, MD [facility] [phone]   I, Thana Ates, am acting as a scribe for Dr. Derek Jack.  I, Derek Jack MD, have reviewed the above documentation for accuracy and completeness, and I agree with  the above.

## 2021-08-24 LAB — CEA: CEA: 12.3 ng/mL — ABNORMAL HIGH (ref 0.0–4.7)

## 2021-08-25 ENCOUNTER — Encounter (HOSPITAL_COMMUNITY): Payer: Self-pay

## 2021-08-25 ENCOUNTER — Inpatient Hospital Stay (HOSPITAL_COMMUNITY): Payer: Medicaid Other

## 2021-08-25 VITALS — BP 138/98 | HR 78 | Temp 98.6°F | Resp 18

## 2021-08-25 DIAGNOSIS — C189 Malignant neoplasm of colon, unspecified: Secondary | ICD-10-CM

## 2021-08-25 DIAGNOSIS — Z5111 Encounter for antineoplastic chemotherapy: Secondary | ICD-10-CM | POA: Diagnosis not present

## 2021-08-25 DIAGNOSIS — Z95828 Presence of other vascular implants and grafts: Secondary | ICD-10-CM

## 2021-08-25 MED ORDER — HEPARIN SOD (PORK) LOCK FLUSH 100 UNIT/ML IV SOLN
500.0000 [IU] | Freq: Once | INTRAVENOUS | Status: AC | PRN
  Administered 2021-08-25: 500 [IU]

## 2021-08-25 MED ORDER — SODIUM CHLORIDE 0.9% FLUSH
10.0000 mL | INTRAVENOUS | Status: DC | PRN
  Administered 2021-08-25: 10 mL

## 2021-08-25 MED ORDER — PEGFILGRASTIM-CBQV 6 MG/0.6ML ~~LOC~~ SOSY
6.0000 mg | PREFILLED_SYRINGE | Freq: Once | SUBCUTANEOUS | Status: AC
  Administered 2021-08-25: 6 mg via SUBCUTANEOUS
  Filled 2021-08-25: qty 0.6

## 2021-08-25 NOTE — Patient Instructions (Signed)
[facility]  Discharge Instructions: Thank you for choosing [facility] to provide your oncology and hematology care.  If you have a lab appointment with the Cancer Center, please come in thru the Main Entrance and check in at the main information desk.  Wear comfortable clothing and clothing appropriate for easy access to any Portacath or PICC line.   We strive to give you quality time with your provider. You may need to reschedule your appointment if you arrive late (15 or more minutes).  Arriving late affects you and other patients whose appointments are after yours.  Also, if you miss three or more appointments without notifying the office, you may be dismissed from the clinic at the provider's discretion.      For prescription refill requests, have your pharmacy contact our office and allow 72 hours for refills to be completed.         To help prevent nausea and vomiting after your treatment, we encourage you to take your nausea medication as directed.  BELOW ARE SYMPTOMS THAT SHOULD BE REPORTED IMMEDIATELY: *FEVER GREATER THAN 100.4 F (38 C) OR HIGHER *CHILLS OR SWEATING *NAUSEA AND VOMITING THAT IS NOT CONTROLLED WITH YOUR NAUSEA MEDICATION *UNUSUAL SHORTNESS OF BREATH *UNUSUAL BRUISING OR BLEEDING *URINARY PROBLEMS (pain or burning when urinating, or frequent urination) *BOWEL PROBLEMS (unusual diarrhea, constipation, pain near the anus) TENDERNESS IN MOUTH AND THROAT WITH OR WITHOUT PRESENCE OF ULCERS (sore throat, sores in mouth, or a toothache) UNUSUAL RASH, SWELLING OR PAIN  UNUSUAL VAGINAL DISCHARGE OR ITCHING   Items with * indicate a potential emergency and should be followed up as soon as possible or go to the Emergency Department if any problems should occur.  Please show the CHEMOTHERAPY ALERT CARD or IMMUNOTHERAPY ALERT CARD at check-in to the Emergency Department and triage nurse.  Should you have questions after your visit or need to  cancel or reschedule your appointment, please contact [facility] [phone]  and follow the prompts.  Office hours are 8:00 a.m. to 4:30 p.m. Monday - Friday. Please note that voicemails left after 4:00 p.m. may not be returned until the following business day.  We are closed weekends and major holidays. You have access to a nurse at all times for urgent questions. Please call the main number to the clinic [phone] and follow the prompts.  For any non-urgent questions, you may also contact your provider using MyChart. We now offer e-Visits for anyone 18 and older to request care online for non-urgent symptoms. For details visit [email or web address].   Also download the MyChart app! Go to the app store, search "MyChart", open the app, select [facility], and log in with your MyChart username and password.  Masks are optional in the cancer centers. If you would like for your care team to wear a mask while they are taking care of you, please let them know. For doctor visits, patients may have with them one support person who is at least 51 years old. At this time, visitors are not allowed in the infusion area.  

## 2021-08-25 NOTE — Progress Notes (Signed)
Patient for chemotherapy pump disconnect with no complaints voiced.  Patients port flushed without difficulty.  Good blood return noted with no bruising or swelling noted at site.  Band aid applied.  VSS with discharge and left ambulatory with no s/s of distress noted.    Patient tolerated injection with no complaints voiced.  Site clean and dry with no bruising or swelling noted at site.  See MAR for details.  Band aid applied.  Patient stable during and after injection.  Vss with discharge and left in satisfactory condition with no s/s of distress noted.

## 2021-08-30 ENCOUNTER — Other Ambulatory Visit: Payer: Self-pay

## 2021-08-31 ENCOUNTER — Encounter (HOSPITAL_COMMUNITY): Payer: Self-pay | Admitting: Nurse Practitioner

## 2021-09-03 ENCOUNTER — Encounter: Payer: Self-pay | Admitting: Licensed Clinical Social Worker

## 2021-09-03 DIAGNOSIS — C189 Malignant neoplasm of colon, unspecified: Secondary | ICD-10-CM

## 2021-09-03 NOTE — Progress Notes (Signed)
[facility] CSW Progress Note  Clinical Education officer, museum contacted patient by phone to discuss filing for disability.  Pt had applied for disability upon diagnosis and was denied.  At present it is uncertain if pt's colostomy will be reversible.  Pt to have a colonoscopy in August and at that time it may be more clear if the colostomy will be permanent or reversible.  If the colostomy is anticipated to be permanent pt will reach out to CSW to put in a referral to the [facility] to re-apply for disability.    Henriette Combs, LCSW

## 2021-09-06 ENCOUNTER — Inpatient Hospital Stay (HOSPITAL_COMMUNITY): Payer: Medicaid Other

## 2021-09-06 ENCOUNTER — Telehealth: Payer: Self-pay | Admitting: *Deleted

## 2021-09-06 ENCOUNTER — Inpatient Hospital Stay (HOSPITAL_COMMUNITY): Payer: Medicaid Other | Admitting: Dietician

## 2021-09-06 NOTE — Telephone Encounter (Signed)
LMOVM to call back to reschedule procedure on 8/4

## 2021-09-07 ENCOUNTER — Other Ambulatory Visit: Payer: Self-pay

## 2021-09-07 NOTE — Telephone Encounter (Signed)
Spoke with pt. Scheduled for 8/25 at 2:15pm. Aware will send new instructions.

## 2021-09-07 NOTE — Telephone Encounter (Signed)
LMOVM to call back 

## 2021-09-08 ENCOUNTER — Inpatient Hospital Stay (HOSPITAL_COMMUNITY): Payer: Medicaid Other

## 2021-09-14 ENCOUNTER — Telehealth: Payer: Self-pay | Admitting: *Deleted

## 2021-09-14 NOTE — Telephone Encounter (Signed)
Spoke with pt. Dr. Abbey Chatters not available in 8/25. Procedure moved to 8/28 at 12:30pm. Aware will send new instructions.

## 2021-09-15 ENCOUNTER — Other Ambulatory Visit: Payer: Self-pay

## 2021-09-21 ENCOUNTER — Inpatient Hospital Stay (HOSPITAL_COMMUNITY): Payer: Medicaid Other | Admitting: Hematology

## 2021-09-21 ENCOUNTER — Inpatient Hospital Stay (HOSPITAL_COMMUNITY): Payer: Medicaid Other

## 2021-09-22 ENCOUNTER — Inpatient Hospital Stay (HOSPITAL_COMMUNITY): Payer: Medicaid Other | Attending: Hematology

## 2021-09-22 ENCOUNTER — Inpatient Hospital Stay (HOSPITAL_BASED_OUTPATIENT_CLINIC_OR_DEPARTMENT_OTHER): Payer: Medicaid Other | Admitting: Hematology

## 2021-09-22 ENCOUNTER — Inpatient Hospital Stay: Payer: Medicaid Other

## 2021-09-22 VITALS — BP 147/92 | HR 81 | Temp 97.8°F | Resp 18

## 2021-09-22 VITALS — BP 147/95 | HR 89 | Temp 98.2°F | Resp 18 | Ht 63.0 in | Wt 139.6 lb

## 2021-09-22 DIAGNOSIS — Z5189 Encounter for other specified aftercare: Secondary | ICD-10-CM | POA: Diagnosis not present

## 2021-09-22 DIAGNOSIS — C779 Secondary and unspecified malignant neoplasm of lymph node, unspecified: Secondary | ICD-10-CM | POA: Diagnosis not present

## 2021-09-22 DIAGNOSIS — E876 Hypokalemia: Secondary | ICD-10-CM | POA: Diagnosis not present

## 2021-09-22 DIAGNOSIS — D509 Iron deficiency anemia, unspecified: Secondary | ICD-10-CM | POA: Insufficient documentation

## 2021-09-22 DIAGNOSIS — C189 Malignant neoplasm of colon, unspecified: Secondary | ICD-10-CM

## 2021-09-22 DIAGNOSIS — Z95828 Presence of other vascular implants and grafts: Secondary | ICD-10-CM

## 2021-09-22 DIAGNOSIS — Z5111 Encounter for antineoplastic chemotherapy: Secondary | ICD-10-CM | POA: Diagnosis not present

## 2021-09-22 DIAGNOSIS — I10 Essential (primary) hypertension: Secondary | ICD-10-CM | POA: Diagnosis not present

## 2021-09-22 DIAGNOSIS — C7989 Secondary malignant neoplasm of other specified sites: Secondary | ICD-10-CM

## 2021-09-22 DIAGNOSIS — C187 Malignant neoplasm of sigmoid colon: Secondary | ICD-10-CM | POA: Diagnosis not present

## 2021-09-22 LAB — CBC WITH DIFFERENTIAL/PLATELET
Abs Immature Granulocytes: 0.01 10*3/uL (ref 0.00–0.07)
Basophils Absolute: 0.1 10*3/uL (ref 0.0–0.1)
Basophils Relative: 1 %
Eosinophils Absolute: 0.1 10*3/uL (ref 0.0–0.5)
Eosinophils Relative: 2 %
HCT: 32.9 % — ABNORMAL LOW (ref 36.0–46.0)
Hemoglobin: 11 g/dL — ABNORMAL LOW (ref 12.0–15.0)
Immature Granulocytes: 0 %
Lymphocytes Relative: 42 %
Lymphs Abs: 2.7 10*3/uL (ref 0.7–4.0)
MCH: 31.2 pg (ref 26.0–34.0)
MCHC: 33.4 g/dL (ref 30.0–36.0)
MCV: 93.2 fL (ref 80.0–100.0)
Monocytes Absolute: 0.7 10*3/uL (ref 0.1–1.0)
Monocytes Relative: 11 %
Neutro Abs: 2.9 10*3/uL (ref 1.7–7.7)
Neutrophils Relative %: 44 %
Platelets: 285 10*3/uL (ref 150–400)
RBC: 3.53 MIL/uL — ABNORMAL LOW (ref 3.87–5.11)
RDW: 16.4 % — ABNORMAL HIGH (ref 11.5–15.5)
WBC: 6.5 10*3/uL (ref 4.0–10.5)
nRBC: 0 % (ref 0.0–0.2)

## 2021-09-22 LAB — COMPREHENSIVE METABOLIC PANEL
ALT: 19 U/L (ref 0–44)
AST: 19 U/L (ref 15–41)
Albumin: 3.2 g/dL — ABNORMAL LOW (ref 3.5–5.0)
Alkaline Phosphatase: 47 U/L (ref 38–126)
Anion gap: 3 — ABNORMAL LOW (ref 5–15)
BUN: 7 mg/dL (ref 6–20)
CO2: 23 mmol/L (ref 22–32)
Calcium: 8.5 mg/dL — ABNORMAL LOW (ref 8.9–10.3)
Chloride: 114 mmol/L — ABNORMAL HIGH (ref 98–111)
Creatinine, Ser: 0.57 mg/dL (ref 0.44–1.00)
GFR, Estimated: >60 mL/min (ref >60)
Glucose, Bld: 94 mg/dL (ref 70–99)
Potassium: 3.8 mmol/L (ref 3.5–5.1)
Sodium: 140 mmol/L (ref 135–145)
Total Bilirubin: 0.4 mg/dL (ref 0.3–1.2)
Total Protein: 6.3 g/dL — ABNORMAL LOW (ref 6.5–8.1)

## 2021-09-22 LAB — MAGNESIUM: Magnesium: 1.9 mg/dL (ref 1.7–2.4)

## 2021-09-22 MED ORDER — LEUCOVORIN CALCIUM INJECTION 350 MG
200.0000 mg/m2 | Freq: Once | INTRAVENOUS | Status: AC
  Administered 2021-09-22: 332 mg via INTRAVENOUS
  Filled 2021-09-22: qty 16.6

## 2021-09-22 MED ORDER — ATROPINE SULFATE 1 MG/ML IV SOLN
0.5000 mg | Freq: Once | INTRAVENOUS | Status: AC
  Administered 2021-09-22: 0.5 mg via INTRAVENOUS

## 2021-09-22 MED ORDER — SODIUM CHLORIDE 0.9 % IV SOLN
2400.0000 mg/m2 | INTRAVENOUS | Status: DC
  Administered 2021-09-22: 4000 mg via INTRAVENOUS
  Filled 2021-09-22: qty 80

## 2021-09-22 MED ORDER — SODIUM CHLORIDE 0.9 % IV SOLN
6.2000 mg/kg | Freq: Once | INTRAVENOUS | Status: AC
  Administered 2021-09-22: 400 mg via INTRAVENOUS
  Filled 2021-09-22: qty 20

## 2021-09-22 MED ORDER — PALONOSETRON HCL INJECTION 0.25 MG/5ML
0.2500 mg | Freq: Once | INTRAVENOUS | Status: AC
  Administered 2021-09-22: 0.25 mg via INTRAVENOUS

## 2021-09-22 MED ORDER — OXALIPLATIN CHEMO INJECTION 100 MG/20ML
85.0000 mg/m2 | Freq: Once | INTRAVENOUS | Status: AC
  Administered 2021-09-22: 140 mg via INTRAVENOUS
  Filled 2021-09-22: qty 20

## 2021-09-22 MED ORDER — SODIUM CHLORIDE 0.9% FLUSH
10.0000 mL | INTRAVENOUS | Status: DC | PRN
  Administered 2021-09-22: 10 mL

## 2021-09-22 MED ORDER — SODIUM CHLORIDE 0.9 % IV SOLN
165.0000 mg/m2 | Freq: Once | INTRAVENOUS | Status: AC
  Administered 2021-09-22: 280 mg via INTRAVENOUS
  Filled 2021-09-22: qty 10

## 2021-09-22 MED ORDER — SODIUM CHLORIDE 0.9 % IV SOLN
150.0000 mg | Freq: Once | INTRAVENOUS | Status: AC
  Administered 2021-09-22: 150 mg via INTRAVENOUS
  Filled 2021-09-22: qty 150

## 2021-09-22 MED ORDER — DEXTROSE 5 % IV SOLN: Freq: Once | INTRAVENOUS | Status: AC

## 2021-09-22 MED ORDER — SODIUM CHLORIDE 0.9 % IV SOLN: Freq: Once | INTRAVENOUS | Status: AC

## 2021-09-22 MED ORDER — SODIUM CHLORIDE 0.9 % IV SOLN
10.0000 mg | Freq: Once | INTRAVENOUS | Status: AC
  Administered 2021-09-22: 10 mg via INTRAVENOUS
  Filled 2021-09-22: qty 10

## 2021-09-22 NOTE — Progress Notes (Signed)
[facility] [place], [place] [address]   CLINIC:  Medical Oncology/Hematology  PCP:  Caryl Bis, MD [address] 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  PRIOR THERAPY: Sigmoid colon resection, partial small bowel resection and Hartman's procedure on 03/26/2021  NGS Results: KRAS/NRAS negative, BRAF negative, HER2 negative, MSI-stable  CURRENT THERAPY: FOLFOXIRI + Panitumumab q14d  BRIEF ONCOLOGIC HISTORY:  Oncology History  Colon carcinoma ([place])  03/26/2021 Initial Diagnosis   Colon carcinoma ([place])   04/06/2021 Cancer Staging   Staging form: Colon and Rectum, AJCC 8th Edition - Clinical stage from 04/06/2021: Stage IVC (cT4a, cN2b, pM1c) - Signed by Derek Jack, MD on 05/04/2021 Histopathologic type: Adenocarcinoma, NOS Stage prefix: Initial diagnosis Total positive nodes: 9 Total nodes examined: 9 Tumor deposits (TD): Present Carcinoembryonic antigen (CEA) (ng/mL): 61.5 Perineural invasion (PNI): Present Microsatellite instability (MSI): Stable KRAS mutation: Negative NRAS mutation: Negative BRAF mutation: Negative   05/17/2021 -  Chemotherapy   Patient is on Treatment Plan : COLORECTAL FOLFOXIRI + Panitumumab q14d       CANCER STAGING:  Cancer Staging  Colon carcinoma [facility]) Staging form: Colon and Rectum, AJCC 8th Edition - Clinical stage from 04/06/2021: Stage IVC (cT4a, cN2b, pM1c) - Signed by Derek Jack, MD on 05/04/2021   INTERVAL HISTORY:  [name], a 51 y.o. female, is seen for follow-up of metastatic colon cancer and for next cycle of chemotherapy.  She denies any significant cold sensitivity or neuropathy.  Energy levels are great.  Denies any abdominal cramping or diarrhea.  She was evaluated by her GYN yesterday.  Appetite and energy levels are reported as 100%.  No skin rashes reported.   REVIEW OF  SYSTEMS:  Review of Systems  Constitutional:  Negative for appetite change and fatigue.  Neurological:  Negative for numbness.  All other systems reviewed and are negative.   PAST MEDICAL/SURGICAL HISTORY:  Past Medical History:  Diagnosis Date   Anemia    Back pain    Hypertension    Port-A-Cath in place 05/10/2021   Past Surgical History:  Procedure Laterality Date   BIOPSY  03/02/2021   Procedure: BIOPSY;  Surgeon: Eloise Harman, DO;  [facility];  Service: Endoscopy;;   BOWEL RESECTION N/A 03/26/2021   Procedure: SMALL BOWEL RESECTION;  Surgeon: Aviva Signs, MD;  Location: [facility] OR[facility];  Service: General;  Laterality: N/A;   CHOLECYSTECTOMY     COLOSTOMY N/A 03/26/2021   Procedure: COLOSTOMY;  Surgeon: Aviva Signs, MD;  Location: [facility] OR[facility];  Service: General;  Laterality: N/A;   ESOPHAGOGASTRODUODENOSCOPY (EGD) WITH PROPOFOL N/A 03/02/2021   Procedure: ESOPHAGOGASTRODUODENOSCOPY (EGD) WITH PROPOFOL;  Surgeon: Eloise Harman, DO;  [facility];  Service: Endoscopy;  Laterality: N/A;   PARTIAL COLECTOMY N/A 03/26/2021   Procedure: PARTIAL COLECTOMY;  Surgeon: Aviva Signs, MD;  Location: [facility];  Service: General;  Laterality: N/A;   PORTACATH PLACEMENT Left 04/14/2021   Procedure: INSERTION PORT-A-CATH;  Surgeon: Aviva Signs, MD;  Location: [facility];  Service: General;  Laterality: Left;   SCLEROTHERAPY  03/02/2021   Procedure: Clide Deutscher;  Surgeon: Eloise Harman, DO;  [facility];  Service: Endoscopy;;   SIGMOIDOSCOPY  03/02/2021   Procedure: Lonell Face;  Surgeon: Eloise Harman, DO;  [facility];  Service: Endoscopy;;    SOCIAL HISTORY:  Social History   Socioeconomic History  Marital status: Married    Spouse name: Not on file   Number of children: Not on file   Years of education: Not on file   Highest education level: Not on file  Occupational History   Not on file  Tobacco Use   Smoking status: Never    Smokeless tobacco: Never  Vaping Use   Vaping Use: Never used  Substance and Sexual Activity   Alcohol use: No   Drug use: No   Sexual activity: Not on file  Other Topics Concern   Not on file  Social History Narrative   Not on file   Social Determinants of Health   Financial Resource Strain: High Risk (05/19/2021)   Overall Financial Resource Strain (CARDIA)    Difficulty of Paying Living Expenses: Hard  Food Insecurity: Not on file  Transportation Needs: Not on file  Physical Activity: Not on file  Stress: Not on file  Social Connections: Not on file  Intimate Partner Violence: Not on file    FAMILY HISTORY:  Family History  Problem Relation Age of Onset   Hypertension Mother    Hypertension Father    Colon cancer Neg Hx    Colon polyps Neg Hx    Inflammatory bowel disease Neg Hx     CURRENT MEDICATIONS:  Current Outpatient Medications  Medication Sig Dispense Refill   doxycycline (VIBRA-TABS) 100 MG tablet Take 1 tablet (100 mg total) by mouth 2 (two) times daily. 90 tablet 3   ferrous sulfate 325 (65 FE) MG tablet TAKE 325 MG BY MOUTH DAILY. (Patient taking differently: 325 mg daily with breakfast.) 90 tablet 1   fexofenadine (ALLEGRA) 180 MG tablet Take 1 tablet (180 mg total) by mouth daily. 30 tablet 0   fluorouracil CALGB 15945 2,400 mg/m2 in sodium chloride 0.9 % 150 mL Inject 2,400 mg/m2 into the vein over 48 hr. Every 14 days     FLUOROURACIL IV Inject into the vein every 14 (fourteen) days.     fluticasone (FLONASE) 50 MCG/ACT nasal spray Place 2 sprays into both nostrils daily. 16 g 12   hydrocortisone 1 % lotion Apply 1 application. topically 2 (two) times daily. 118 mL 5   lidocaine (XYLOCAINE) 2 % solution Use as directed 10 mLs in the mouth or throat 4 (four) times daily as needed for mouth pain (Mix 1:1 with carafate and swish and spit). 100 mL ML   magnesium oxide (MAG-OX) 400 (240 Mg) MG tablet Take 1 tablet (400 mg total) by mouth 2 (two) times daily.  60 tablet 6   medroxyPROGESTERone (DEPO-PROVERA) 150 MG/ML injection Inject 150 mg into the muscle every 3 (three) months.     medroxyPROGESTERone (PROVERA) 5 MG tablet Take 5 mg by mouth every other day.     megestrol (MEGACE) 400 MG/10ML suspension Take 10 mLs (400 mg total) by mouth 2 (two) times daily. 240 mL 2   Multiple Vitamin (MULTIVITAMIN) tablet Take 1 tablet by mouth daily.     nebivolol (BYSTOLIC) 10 MG tablet Take 1 tablet (10 mg total) by mouth daily. 30 tablet 1   OXALIPLATIN IV Inject into the vein every 14 (fourteen) days.     Panitumumab (VECTIBIX IV) Inject into the vein every 14 (fourteen) days.     pantoprazole (PROTONIX) 40 MG tablet Take 40 mg by mouth daily as needed (acid reflux).     polyethylene glycol-electrolytes (NULYTELY) 420 g solution As directed 4000 mL 0   potassium chloride SA (KLOR-CON M20) 20 MEQ  tablet Take 1 tablet (20 mEq total) by mouth in the morning, at noon, in the evening, and at bedtime. 120 tablet 3   sucralfate (CARAFATE) 1 GM/10ML suspension Take 10 mLs (1 g total) by mouth 4 (four) times daily as needed. Mix 1:1 with 2% lidocaine viscous solution and swish and spit 420 mL 6   No current facility-administered medications for this visit.    ALLERGIES:  Allergies  Allergen Reactions   Zithromax [Azithromycin] Hives    infusing arm began to swell , her face got red, and she broke out, there are bumps around where the IV site was infusing   Motrin [Ibuprofen] Itching   Sudafed [Pseudoephedrine Hcl] Other (See Comments)    Hypertension   Zestril [Lisinopril] Cough    PHYSICAL EXAM:  Performance status (ECOG): 1 - Symptomatic but completely ambulatory  Vitals:   09/22/21 0812  BP: (!) 147/95  Pulse: 89  Resp: 18  SpO2: 100%   Wt Readings from Last 3 Encounters:  09/22/21 139 lb 9.6 oz (63.3 kg)  08/23/21 137 lb 6.4 oz (62.3 kg)  07/27/21 132 lb 6.4 oz (60.1 kg)   Physical Exam Vitals reviewed.  Constitutional:      Appearance:  Normal appearance.  Cardiovascular:     Rate and Rhythm: Normal rate and regular rhythm.     Pulses: Normal pulses.     Heart sounds: Normal heart sounds.  Pulmonary:     Effort: Pulmonary effort is normal.     Breath sounds: Normal breath sounds.  Neurological:     General: No focal deficit present.     Mental Status: She is alert and oriented to person, place, and time.  Psychiatric:        Mood and Affect: Mood normal.        Behavior: Behavior normal.    LABORATORY DATA:  I have reviewed the labs as listed.     Latest Ref Rng & Units 08/23/2021    8:41 AM 07/27/2021    8:22 AM 07/13/2021    8:13 AM  CBC  WBC 4.0 - 10.5 K/uL 5.2  7.9  9.4   Hemoglobin 12.0 - 15.0 g/dL 10.9  10.3  13.9   Hematocrit 36.0 - 46.0 % 32.6  31.0  41.1   Platelets 150 - 400 K/uL 259  201  237       Latest Ref Rng & Units 08/23/2021    8:41 AM 07/27/2021    8:22 AM 07/13/2021    8:13 AM  CMP  Glucose 70 - 99 mg/dL 102  100  124   BUN 6 - 20 mg/dL 8  6  18    Creatinine 0.44 - 1.00 mg/dL 0.60  0.65  1.19   Sodium 135 - 145 mmol/L 141  140  137   Potassium 3.5 - 5.1 mmol/L 3.1  3.1  2.5   Chloride 98 - 111 mmol/L 110  114  109   CO2 22 - 32 mmol/L 25  23  19    Calcium 8.9 - 10.3 mg/dL 8.7  8.3  9.1   Total Protein 6.5 - 8.1 g/dL 6.6  6.0  7.6   Total Bilirubin 0.3 - 1.2 mg/dL 0.6  0.3  0.7   Alkaline Phos 38 - 126 U/L 51  41  92   AST 15 - 41 U/L 24  45  81   ALT 0 - 44 U/L 31  57  206     DIAGNOSTIC IMAGING:  I  have independently reviewed the scans and discussed with the patient. No results found.   ASSESSMENT:  PT4PN2B N1C stage IV sigmoid colon cancer, NRAS negative: - Colonoscopy on 03/02/2021: Nonbleeding internal hemorrhoids, severe stenosis in the sigmoid colon at approximately 18 cm from anal verge, unable to be traversed with pediatric colonoscopy.  Biopsy consistent with moderately to poorly differentiated adenocarcinoma. - CTAP with contrast on 01/10/2021: Diffusely thickened, 10 cm  segment of distal sigmoid colon with adjacent moderate inflammatory reaction, suspected 2 sites of small bowel fistulization to the diseased segment and a rim-enhancing low-density 2.5 x 1.8 cm stricture medial to the mid segment which could represent a necrotic lymph node or contained perforation/abscess.  There are bilateral mildly enlarged common iliac chain nodes.  Mildly dilated mid to lower abdominal small bowel segments.  Mildly prominent slightly steatotic liver. - Sigmoid colon resection, partial small bowel resection and Hartman's procedure on 03/26/2021: Moderately to poorly differentiated adenocarcinoma involving the subserosal adipose tissue and present at the margin.  9/9 lymph nodes positive for metastatic carcinoma.  Small bowel resection is positive for adenocarcinoma nodules in the serosal fat.  PT4PN2B.  MMR is preserved. - Operative report mentions obvious extension of the tumor outside the colon to the pelvic brim, unable to fully excise all of the tumor in the pelvis. - NGS testing: KRAS/NRAS negative, BRAF negative, HER2 negative, MSI-stable - PET scan on 04/29/2021: Multifocal hypermetabolic nodule, peritoneal and anterior abdominal wall adenopathy.  Findings worrisome for peritoneal carcinomatosis.  No evidence of hepatic metastatic disease or distant mets. - FOLFOXIRI and Vectibix started on 05/17/2021 - We will see how she responds and will refer to Dr. Crisoforo Oxford for resection/HIPEC therapy.    Social/family history: - She lives at home with her family.  She works as a Quarry manager at [employer].  Non-smoker. - Paternal cousin had cancer.  Maternal aunt had cancer.   PLAN:  Stage IV sigmoid colon adenocarcinoma, MSI-stable, RAS/BRAF negative: -CT CAP on 08/16/2021 showed resolution of loculated fluid in the lower abdomen with decrease in size of dominant area of nodal tissue in the sigmoid mesentery.  No signs of metastatic disease to solid organs or chest.  No definitive signs of  peritoneal disease currently. - Reviewed labs today which shows normal LFTs with slightly low albumin.  CBC was grossly normal.  Last CEA was 12.3 on 08/23/2021.  She may proceed with cycle 7 without any dose modifications.  She has an appointment to see Dr. Crisoforo Oxford on 09/28/2021.  She will continue chemotherapy every 2 weeks unless there is a plan for surgery.  RTC 6 weeks for follow-up with repeat labs.  2.  Hypokalemia: -Continue potassium 20 mEq 4 times daily.  Potassium today is normal.  3.  Weight loss: -She is continuing to improve weight.  She is eating better.  No longer requiring boost supplements.  4.  Microcytic anemia: -Continue iron tablet daily.  Hemoglobin is 11.  5.  Acneform rash: -No rash at this time.  She is not taking doxycycline.  She will start back if she gets rash again.   Orders placed this encounter:  No orders of the defined types were placed in this encounter.    Derek Jack, MD [place] [phone]

## 2021-09-22 NOTE — Progress Notes (Signed)
Patient presents today for treatment. Patient okay for treatment today per Dr. Delton Coombes. Labs within treatment parameters.  Patient tolerated chemotherapy with no complaints voiced. Side effects with management reviewed understanding verbalized. Port site clean and dry with no bruising or swelling noted at site. Good blood return noted before and after administration of chemotherapy. Chemo pump connected with no alarms noted. Patient left in satisfactory condition with VSS and no s/s of distress noted.

## 2021-09-22 NOTE — Patient Instructions (Signed)
[facility]  Discharge Instructions: Thank you for choosing [facility] to provide your oncology and hematology care.  If you have a lab appointment with the [facility], please come in thru the Main Entrance and check in at the main information desk.  Wear comfortable clothing and clothing appropriate for easy access to any Portacath or PICC line.   We strive to give you quality time with your provider. You may need to reschedule your appointment if you arrive late (15 or more minutes).  Arriving late affects you and other patients whose appointments are after yours.  Also, if you miss three or more appointments without notifying the office, you may be dismissed from the clinic at the provider's discretion.      For prescription refill requests, have your pharmacy contact our office and allow 72 hours for refills to be completed.    Today you received the following chemotherapy and/or immunotherapy agents Folfoxiri, return as scheduled.    To help prevent nausea and vomiting after your treatment, we encourage you to take your nausea medication as directed.  BELOW ARE SYMPTOMS THAT SHOULD BE REPORTED IMMEDIATELY: *FEVER GREATER THAN 100.4 F (38 C) OR HIGHER *CHILLS OR SWEATING *NAUSEA AND VOMITING THAT IS NOT CONTROLLED WITH YOUR NAUSEA MEDICATION *UNUSUAL SHORTNESS OF BREATH *UNUSUAL BRUISING OR BLEEDING *URINARY PROBLEMS (pain or burning when urinating, or frequent urination) *BOWEL PROBLEMS (unusual diarrhea, constipation, pain near the anus) TENDERNESS IN MOUTH AND THROAT WITH OR WITHOUT PRESENCE OF ULCERS (sore throat, sores in mouth, or a toothache) UNUSUAL RASH, SWELLING OR PAIN  UNUSUAL VAGINAL DISCHARGE OR ITCHING   Items with * indicate a potential emergency and should be followed up as soon as possible or go to the Emergency Department if any problems should occur.  Please show the CHEMOTHERAPY ALERT CARD or IMMUNOTHERAPY ALERT CARD at  check-in to the Emergency Department and triage nurse.  Should you have questions after your visit or need to cancel or reschedule your appointment, please contact [facility] [phone]  and follow the prompts.  Office hours are 8:00 a.m. to 4:30 p.m. Monday - Friday. Please note that voicemails left after 4:00 p.m. may not be returned until the following business day.  We are closed weekends and major holidays. You have access to a nurse at all times for urgent questions. Please call the main number to the clinic [phone] and follow the prompts.  For any non-urgent questions, you may also contact your provider using MyChart. We now offer e-Visits for anyone 53 and older to request care online for non-urgent symptoms. For details visit [email or web address].   Also download the MyChart app! Go to the app store, search "MyChart", open the app, select [facility], and log in with your MyChart username and password.  Masks are optional in the cancer centers. If you would like for your care team to wear a mask while they are taking care of you, please let them know. You may have one support person who is at least 51 years old accompany you for your appointments.

## 2021-09-22 NOTE — Patient Instructions (Addendum)
[facility] at [facility] Discharge Instructions   You were seen and examined today by Dr. Delton Coombes.  He reviewed part of your lab work which is normal/stable.   We will proceed with your treatment today.  Return as scheduled.    Thank you for choosing [facility] at [facility] to provide your oncology and hematology care.  To afford each patient quality time with our provider, please arrive at least 15 minutes before your scheduled appointment time.   If you have a lab appointment with the [facility] please come in thru the Main Entrance and check in at the main information desk.  You need to re-schedule your appointment should you arrive 10 or more minutes late.  We strive to give you quality time with our providers, and arriving late affects you and other patients whose appointments are after yours.  Also, if you no show three or more times for appointments you may be dismissed from the clinic at the providers discretion.     Again, thank you for choosing [facility].  Our hope is that these requests will decrease the amount of time that you wait before being seen by our physicians.       _____________________________________________________________  Should you have questions after your visit to [facility], please contact our office at [phone] and follow the prompts.  Our office hours are 8:00 a.m. and 4:30 p.m. Monday - Friday.  Please note that voicemails left after 4:00 p.m. may not be returned until the following business day.  We are closed weekends and major holidays.  You do have access to a nurse 24-7, just call the main number to the clinic [phone] and do not press any options, hold on the line and a nurse will answer the phone.    For prescription refill requests, have your pharmacy contact our office and allow 72 hours.    Due to Covid, you will need to wear a mask upon entering the  hospital. If you do not have a mask, a mask will be given to you at the Main Entrance upon arrival. For doctor visits, patients may have 1 support person age 45 or older with them. For treatment visits, patients can not have anyone with them due to social distancing guidelines and our immunocompromised population.

## 2021-09-23 ENCOUNTER — Inpatient Hospital Stay (HOSPITAL_COMMUNITY): Payer: Medicaid Other

## 2021-09-23 ENCOUNTER — Other Ambulatory Visit: Payer: Self-pay

## 2021-09-24 ENCOUNTER — Inpatient Hospital Stay: Payer: Medicaid Other

## 2021-09-24 VITALS — BP 122/75 | HR 80 | Temp 97.2°F | Resp 18

## 2021-09-24 DIAGNOSIS — Z5111 Encounter for antineoplastic chemotherapy: Secondary | ICD-10-CM | POA: Diagnosis not present

## 2021-09-24 DIAGNOSIS — Z95828 Presence of other vascular implants and grafts: Secondary | ICD-10-CM

## 2021-09-24 DIAGNOSIS — C189 Malignant neoplasm of colon, unspecified: Secondary | ICD-10-CM

## 2021-09-24 LAB — CEA: CEA: 6.9 ng/mL — ABNORMAL HIGH (ref 0.0–4.7)

## 2021-09-24 MED ORDER — PEGFILGRASTIM-CBQV 6 MG/0.6ML ~~LOC~~ SOSY
6.0000 mg | PREFILLED_SYRINGE | Freq: Once | SUBCUTANEOUS | Status: AC
  Administered 2021-09-24: 6 mg via SUBCUTANEOUS
  Filled 2021-09-24: qty 0.6

## 2021-09-24 MED ORDER — HEPARIN SOD (PORK) LOCK FLUSH 100 UNIT/ML IV SOLN
500.0000 [IU] | Freq: Once | INTRAVENOUS | Status: AC | PRN
  Administered 2021-09-24: 500 [IU]

## 2021-09-24 MED ORDER — SODIUM CHLORIDE 0.9% FLUSH
10.0000 mL | INTRAVENOUS | Status: DC | PRN
  Administered 2021-09-24: 10 mL

## 2021-09-24 NOTE — Progress Notes (Signed)
Patient for chemotherapy pump disconnect with no complaints voiced.  Patients port flushed without difficulty.  Good blood return noted with no bruising or swelling noted at site.  Band aid applied.  VSS with discharge and left ambulatory with no s/s of distress noted.

## 2021-09-24 NOTE — Patient Instructions (Signed)
[facility]  Discharge Instructions: Thank you for choosing [facility] to provide your oncology and hematology care.  If you have a lab appointment with the Cancer Center, please come in thru the Main Entrance and check in at the main information desk.  Wear comfortable clothing and clothing appropriate for easy access to any Portacath or PICC line.   We strive to give you quality time with your provider. You may need to reschedule your appointment if you arrive late (15 or more minutes).  Arriving late affects you and other patients whose appointments are after yours.  Also, if you miss three or more appointments without notifying the office, you may be dismissed from the clinic at the provider's discretion.      For prescription refill requests, have your pharmacy contact our office and allow 72 hours for refills to be completed.     To help prevent nausea and vomiting after your treatment, we encourage you to take your nausea medication as directed.  BELOW ARE SYMPTOMS THAT SHOULD BE REPORTED IMMEDIATELY: *FEVER GREATER THAN 100.4 F (38 C) OR HIGHER *CHILLS OR SWEATING *NAUSEA AND VOMITING THAT IS NOT CONTROLLED WITH YOUR NAUSEA MEDICATION *UNUSUAL SHORTNESS OF BREATH *UNUSUAL BRUISING OR BLEEDING *URINARY PROBLEMS (pain or burning when urinating, or frequent urination) *BOWEL PROBLEMS (unusual diarrhea, constipation, pain near the anus) TENDERNESS IN MOUTH AND THROAT WITH OR WITHOUT PRESENCE OF ULCERS (sore throat, sores in mouth, or a toothache) UNUSUAL RASH, SWELLING OR PAIN  UNUSUAL VAGINAL DISCHARGE OR ITCHING   Items with * indicate a potential emergency and should be followed up as soon as possible or go to the Emergency Department if any problems should occur.  Please show the CHEMOTHERAPY ALERT CARD or IMMUNOTHERAPY ALERT CARD at check-in to the Emergency Department and triage nurse.  Should you have questions after your visit or need to  cancel or reschedule your appointment, please contact [facility] [phone]  and follow the prompts.  Office hours are 8:00 a.m. to 4:30 p.m. Monday - Friday. Please note that voicemails left after 4:00 p.m. may not be returned until the following business day.  We are closed weekends and major holidays. You have access to a nurse at all times for urgent questions. Please call the main number to the clinic [phone] and follow the prompts.  For any non-urgent questions, you may also contact your provider using MyChart. We now offer e-Visits for anyone 18 and older to request care online for non-urgent symptoms. For details visit [email or web address].   Also download the MyChart app! Go to the app store, search "MyChart", open the app, select [facility], and log in with your MyChart username and password.  Masks are optional in the cancer centers. If you would like for your care team to wear a mask while they are taking care of you, please let them know. You may have one support person who is at least 51 years old accompany you for your appointments.  

## 2021-09-30 ENCOUNTER — Encounter (HOSPITAL_COMMUNITY)
Admission: RE | Admit: 2021-09-30 | Discharge: 2021-09-30 | Disposition: A | Discharge status: Home / Self Care | Payer: Medicaid Other | Source: Ambulatory Visit | Attending: Internal Medicine | Admitting: Internal Medicine

## 2021-09-30 ENCOUNTER — Encounter: Payer: Self-pay | Admitting: *Deleted

## 2021-09-30 DIAGNOSIS — Z01818 Encounter for other preprocedural examination: Secondary | ICD-10-CM

## 2021-09-30 MED ORDER — PEG 3350-KCL-NA BICARB-NACL 420 G PO SOLR: 4000.0000 mL | Freq: Once | ORAL | 0 refills | Status: AC

## 2021-10-04 ENCOUNTER — Ambulatory Visit (HOSPITAL_COMMUNITY): Payer: Medicaid Other | Admitting: Certified Registered Nurse Anesthetist

## 2021-10-04 ENCOUNTER — Ambulatory Visit (HOSPITAL_COMMUNITY)
Admission: RE | Admit: 2021-10-04 | Discharge: 2021-10-04 | Disposition: A | Discharge status: Home / Self Care | Payer: Medicaid Other | Attending: Internal Medicine | Admitting: Internal Medicine

## 2021-10-04 ENCOUNTER — Encounter (HOSPITAL_COMMUNITY)
Admission: RE | Disposition: A | Discharge status: Home / Self Care | Payer: Self-pay | Source: Home / Self Care | Attending: Internal Medicine

## 2021-10-04 ENCOUNTER — Ambulatory Visit (HOSPITAL_BASED_OUTPATIENT_CLINIC_OR_DEPARTMENT_OTHER): Payer: Medicaid Other | Admitting: Certified Registered Nurse Anesthetist

## 2021-10-04 ENCOUNTER — Encounter (HOSPITAL_COMMUNITY): Payer: Self-pay

## 2021-10-04 DIAGNOSIS — D649 Anemia, unspecified: Secondary | ICD-10-CM | POA: Diagnosis not present

## 2021-10-04 DIAGNOSIS — Z1211 Encounter for screening for malignant neoplasm of colon: Secondary | ICD-10-CM | POA: Diagnosis not present

## 2021-10-04 DIAGNOSIS — Z08 Encounter for follow-up examination after completed treatment for malignant neoplasm: Secondary | ICD-10-CM

## 2021-10-04 DIAGNOSIS — C187 Malignant neoplasm of sigmoid colon: Secondary | ICD-10-CM | POA: Diagnosis not present

## 2021-10-04 DIAGNOSIS — D630 Anemia in neoplastic disease: Secondary | ICD-10-CM

## 2021-10-04 DIAGNOSIS — Z85038 Personal history of other malignant neoplasm of large intestine: Secondary | ICD-10-CM | POA: Insufficient documentation

## 2021-10-04 DIAGNOSIS — D759 Disease of blood and blood-forming organs, unspecified: Secondary | ICD-10-CM | POA: Insufficient documentation

## 2021-10-04 DIAGNOSIS — Z980 Intestinal bypass and anastomosis status: Secondary | ICD-10-CM | POA: Diagnosis not present

## 2021-10-04 DIAGNOSIS — I10 Essential (primary) hypertension: Secondary | ICD-10-CM | POA: Diagnosis not present

## 2021-10-04 DIAGNOSIS — Z01818 Encounter for other preprocedural examination: Secondary | ICD-10-CM

## 2021-10-04 HISTORY — PX: COLONOSCOPY WITH PROPOFOL: SHX5780

## 2021-10-04 LAB — POCT PREGNANCY, URINE: Preg Test, Ur: NEGATIVE

## 2021-10-04 SURGERY — COLONOSCOPY WITH PROPOFOL
Anesthesia: General

## 2021-10-04 MED ORDER — PROPOFOL 10 MG/ML IV BOLUS
INTRAVENOUS | Status: DC | PRN
  Administered 2021-10-04: 30 mg via INTRAVENOUS
  Administered 2021-10-04: 50 mg via INTRAVENOUS
  Administered 2021-10-04: 100 mg via INTRAVENOUS
  Administered 2021-10-04: 50 mg via INTRAVENOUS
  Administered 2021-10-04: 20 mg via INTRAVENOUS
  Administered 2021-10-04: 50 mg via INTRAVENOUS

## 2021-10-04 MED ORDER — LACTATED RINGERS IV SOLN
INTRAVENOUS | Status: DC
Start: 2021-10-04 — End: 2021-10-04
  Administered 2021-10-04: 1000 mL via INTRAVENOUS

## 2021-10-04 NOTE — Op Note (Signed)
[facility] Patient Name: Diana Fox Procedure Date: 10/04/2021 11:15 AM MRN: [id number] Date of Birth: 01-23-1971 Attending MD: Elon Alas. Abbey Chatters DO CSN: [id number] Age: 51 Admit Type: Outpatient Procedure:                Colonoscopy Indications:              High risk colon cancer surveillance: Personal                            history of colon cancer Providers:                Elon Alas. Abbey Chatters, DO, Caprice Kluver, Crystal Page,                            Ladoris Gene Technician, Technician Referring MD:              Medicines:                See the Anesthesia note for documentation of the                            administered medications Complications:            No immediate complications. Estimated Blood Loss:     Estimated blood loss was minimal. Procedure:                Pre-Anesthesia Assessment:                           - The anesthesia plan was to use monitored                            anesthesia care (MAC).                           After obtaining informed consent, the colonoscope                            was passed under direct vision. Throughout the                            procedure, the patient's blood pressure, pulse, and                            oxygen saturations were monitored continuously. The                            PCF-HQ190L ([id number]) scope was introduced through                            the anus and advanced to the the cecum, identified                            by appendiceal orifice and ileocecal valve. The                            colonoscopy was performed without difficulty.  The                            patient tolerated the procedure well. The quality                            of the bowel preparation was evaluated using the                            BBPS [facility] Bowel Preparation Scale) with scores                            of: Right Colon = 3, Transverse Colon = 3 and Left                            Colon = 3 (entire  mucosa seen well with no residual                            staining, small fragments of stool or opaque                            liquid). The total BBPS score equals 9. Scope In: 11:27:24 AM Scope Out: 11:39:56 AM Scope Withdrawal Time: 0 hours 4 minutes 7 seconds  Total Procedure Duration: 0 hours 12 minutes 32 seconds  Findings:      The perianal and digital rectal examinations were normal.      Scope inserted into anal canal. There was evidence of a prior sigmoid       resection with Hartman pouch. Tattoo distal to previous mass removed.       Mild Barotrauma. Scope was then removed and introduced into the ostomy.      The colon (entire remaining examined portion) appeared normal. Air was       suctioned out of entire colon and rectum prior to removing colonoscope. Impression:               - Sigmoid resection with [place] pouch                           - The entire examined colon is normal.                           - No specimens collected. Moderate Sedation:      Per Anesthesia Care Recommendation:           - Patient has a contact number available for                            emergencies. The signs and symptoms of potential                            delayed complications were discussed with the                            patient. Return to normal activities tomorrow.  Written discharge instructions were provided to the                            patient.                           - Resume previous diet.                           - Continue present medications.                           - Repeat colonoscopy in 1 year for surveillance.                           - Return to GI clinic PRN. Procedure Code(s):        --- Professional ---                           W4097, Colorectal cancer screening; colonoscopy on                            individual at high risk Diagnosis Code(s):        --- Professional ---                           [phone],  Personal history of other malignant                            neoplasm of large intestine                           Z98.0, Intestinal bypass and anastomosis status CPT copyright 2019 American Medical Association. All rights reserved. The codes documented in this report are preliminary and upon coder review may  be revised to meet current compliance requirements. Elon Alas. Abbey Chatters, DO [place] Abbey Chatters, DO 10/04/2021 11:47:47 AM This report has been signed electronically. Number of Addenda: 0

## 2021-10-04 NOTE — Transfer of Care (Signed)
Immediate Anesthesia Transfer of Care Note  Patient: Diana Fox  Procedure(s) Performed: COLONOSCOPY WITH PROPOFOL  Patient Location: PACU  Anesthesia Type:MAC  Level of Consciousness: drowsy  Airway & Oxygen Therapy: Patient Spontanous Breathing and Patient connected to nasal cannula oxygen  Post-op Assessment: Report given to RN and Post -op Vital signs reviewed and stable  Post vital signs: Reviewed and stable  Last Vitals:  Vitals Value Taken Time  BP    Temp    Pulse    Resp    SpO2      Last Pain:  Vitals:   10/04/21 1123  PainSc: 0-No pain      Patients Stated Pain Goal: 7 (00/76/22 6333)  Complications: No notable events documented.

## 2021-10-04 NOTE — Discharge Instructions (Signed)
  Colonoscopy Discharge Instructions  Read the instructions outlined below and refer to this sheet in the next few weeks. These discharge instructions provide you with general information on caring for yourself after you leave the hospital. Your doctor may also give you specific instructions. While your treatment has been planned according to the most current medical practices available, unavoidable complications occasionally occur.   ACTIVITY You may resume your regular activity, but move at a slower pace for the next 24 hours.  Take frequent rest periods for the next 24 hours.  Walking will help get rid of the air and reduce the bloated feeling in your belly (abdomen).  No driving for 24 hours (because of the medicine (anesthesia) used during the test).   Do not sign any important legal documents or operate any machinery for 24 hours (because of the anesthesia used during the test).  NUTRITION Drink plenty of fluids.  You may resume your normal diet as instructed by your doctor.  Begin with a light meal and progress to your normal diet. Heavy or fried foods are harder to digest and may make you feel sick to your stomach (nauseated).  Avoid alcoholic beverages for 24 hours or as instructed.  MEDICATIONS You may resume your normal medications unless your doctor tells you otherwise.  WHAT YOU CAN EXPECT TODAY Some feelings of bloating in the abdomen.  Passage of more gas than usual.  Spotting of blood in your stool or on the toilet paper.  IF YOU HAD POLYPS REMOVED DURING THE COLONOSCOPY: No aspirin products for 7 days or as instructed.  No alcohol for 7 days or as instructed.  Eat a soft diet for the next 24 hours.  FINDING OUT THE RESULTS OF YOUR TEST Not all test results are available during your visit. If your test results are not back during the visit, make an appointment with your caregiver to find out the results. Do not assume everything is normal if you have not heard from your  caregiver or the medical facility. It is important for you to follow up on all of your test results.  SEEK IMMEDIATE MEDICAL ATTENTION IF: You have more than a spotting of blood in your stool.  Your belly is swollen (abdominal distention).  You are nauseated or vomiting.  You have a temperature over 101.  You have abdominal pain or discomfort that is severe or gets worse throughout the day.   Overall, everything looked good today.  I did not find any polyps.  No evidence of cancer.  Repeat colonoscopy in 1 year for surveillance purposes.  If that one looks good then we can spread you out to 3 years thereafter.  Follow-up with GI as needed.  I hope you have a great rest of your week!  Elon Alas. Abbey Chatters, D.O. Gastroenterology and Hepatology [facility]

## 2021-10-04 NOTE — H&P (Signed)
Primary Care Physician:  Caryl Bis, MD Primary Gastroenterologist:  Dr. Abbey Chatters  Pre-Procedure History & Physical: HPI:  Diana Fox is a 51 y.o. 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  Past Medical History:  Diagnosis Date   Anemia    Back pain    Hypertension    Port-A-Cath in place 05/10/2021    Past Surgical History:  Procedure Laterality Date   BIOPSY  03/02/2021   Procedure: BIOPSY;  Surgeon: Eloise Harman, DO;  [facility];  Service: Endoscopy;;   BOWEL RESECTION N/A 03/26/2021   Procedure: SMALL BOWEL RESECTION;  Surgeon: Aviva Signs, MD;  Location: [facility] OR[facility];  Service: General;  Laterality: N/A;   CHOLECYSTECTOMY     COLOSTOMY N/A 03/26/2021   Procedure: COLOSTOMY;  Surgeon: Aviva Signs, MD;  Location: [facility];  Service: General;  Laterality: N/A;   ESOPHAGOGASTRODUODENOSCOPY (EGD) WITH PROPOFOL N/A 03/02/2021   Procedure: ESOPHAGOGASTRODUODENOSCOPY (EGD) WITH PROPOFOL;  Surgeon: Eloise Harman, DO;  [facility];  Service: Endoscopy;  Laterality: N/A;   PARTIAL COLECTOMY N/A 03/26/2021   Procedure: PARTIAL COLECTOMY;  Surgeon: Aviva Signs, MD;  Location: [facility];  Service: General;  Laterality: N/A;   PORTACATH PLACEMENT Left 04/14/2021   Procedure: INSERTION PORT-A-CATH;  Surgeon: Aviva Signs, MD;  Location: [facility];  Service: General;  Laterality: Left;   SCLEROTHERAPY  03/02/2021   Procedure: Clide Deutscher;  Surgeon: Eloise Harman, DO;  [facility];  Service: Endoscopy;;   SIGMOIDOSCOPY  03/02/2021   Procedure: Lonell Face;  Surgeon: Eloise Harman, DO;  [facility];  Service: Endoscopy;;    Prior to Admission medications   Medication Sig Start Date End Date Taking? Authorizing Provider  doxycycline (VIBRA-TABS) 100 MG tablet Take 1 tablet (100 mg total) by mouth 2 (two) times daily. 06/29/21   Derek Jack, MD   ferrous sulfate 325 (65 FE) MG tablet TAKE 325 MG BY MOUTH DAILY. Patient taking differently: 325 mg daily with breakfast. 02/28/21   Mahala Menghini, PA-C  fexofenadine (ALLEGRA) 180 MG tablet Take 1 tablet (180 mg total) by mouth daily. 11/16/20   Jaynee Eagles, PA-C  fluorouracil CALGB 02409 2,400 mg/m2 in sodium chloride 0.9 % 150 mL Inject 2,400 mg/m2 into the vein over 48 hr. Every 14 days 05/17/21   [provider]  FLUOROURACIL IV Inject into the vein every 14 (fourteen) days. 05/17/21   [provider]  fluticasone (FLONASE) 50 MCG/ACT nasal spray Place 2 sprays into both nostrils daily. 11/16/20   Jaynee Eagles, PA-C  hydrocortisone 1 % lotion Apply 1 application. topically 2 (two) times daily. 06/29/21   Derek Jack, MD  lidocaine (XYLOCAINE) 2 % solution Use as directed 10 mLs in the mouth or throat 4 (four) times daily as needed for mouth pain (Mix 1:1 with carafate and swish and spit). 06/08/21   Derek Jack, MD  magnesium oxide (MAG-OX) 400 (240 Mg) MG tablet Take 1 tablet (400 mg total) by mouth 2 (two) times daily. 06/01/21   Derek Jack, MD  medroxyPROGESTERone (DEPO-PROVERA) 150 MG/ML injection Inject 150 mg into the muscle every 3 (three) months. 03/25/21   [provider]  megestrol (MEGACE) 400 MG/10ML suspension Take 10 mLs (400 mg total) by mouth 2 (two) times daily. 05/25/21   Harriett Rush, PA-C  Multiple Vitamin (MULTIVITAMIN) tablet Take 1 tablet by mouth daily.    [provider]  nebivolol (BYSTOLIC) 10 MG tablet Take 1 tablet (10 mg total) by mouth daily. 08/10/15   Lily Kocher, PA-C  OXALIPLATIN IV Inject into the vein every 14 (fourteen) days. 05/17/21   [provider]  Panitumumab (VECTIBIX IV) Inject into the vein every 14 (fourteen) days. 05/17/21   [provider]  pantoprazole (PROTONIX) 40 MG tablet Take 40 mg by mouth daily as needed (acid reflux). 02/23/21   [provider]   polyethylene glycol-electrolytes (NULYTELY) 420 g solution As directed 08/03/21   Eloise Harman, DO  potassium chloride SA (KLOR-CON M20) 20 MEQ tablet Take 1 tablet (20 mEq total) by mouth in the morning, at noon, in the evening, and at bedtime. 08/23/21   Derek Jack, MD  sucralfate (CARAFATE) 1 GM/10ML suspension Take 10 mLs (1 g total) by mouth 4 (four) times daily as needed. Mix 1:1 with 2% lidocaine viscous solution and swish and spit 06/29/21   Derek Jack, MD    Allergies as of 08/03/2021 - Review Complete 07/29/2021  Allergen Reaction Noted   Azithromycin Hives 01/12/2021   Motrin [ibuprofen] Itching 10/14/2014   Lisinopril  09/02/2013   Sudafed [pseudoephedrine hcl] Other (See Comments) 02/04/2015    Family History  Problem Relation Age of Onset   Hypertension Mother    Hypertension Father    Colon cancer Neg Hx    Colon polyps Neg Hx    Inflammatory bowel disease Neg Hx     Social History   Socioeconomic History   Marital status: Married    Spouse name: Not on file   Number of children: Not on file   Years of education: Not on file   Highest education level: Not on file  Occupational History   Not on file  Tobacco Use   Smoking status: Never   Smokeless tobacco: Never  Vaping Use   Vaping Use: Never used  Substance and Sexual Activity   Alcohol use: No   Drug use: No   Sexual activity: Not on file  Other Topics Concern   Not on file  Social History Narrative   Not on file   Social Determinants of Health   Financial Resource Strain: High Risk (05/19/2021)   Overall Financial Resource Strain (CARDIA)    Difficulty of Paying Living Expenses: Hard  Food Insecurity: Not on file  Transportation Needs: Not on file  Physical Activity: Not on file  Stress: Not on file  Social Connections: Not on file  Intimate Partner Violence: Not on file    Review of Systems: See HPI, otherwise negative ROS  Physical Exam: Vital signs in last 24  hours:     General:   Alert,  Well-developed, well-nourished, pleasant and cooperative in NAD Head:  Normocephalic and atraumatic. Eyes:  Sclera clear, no icterus.   Conjunctiva pink. Ears:  Normal auditory acuity. Nose:  No deformity, discharge,  or lesions. Mouth:  No deformity or lesions, dentition normal. Neck:  Supple; no masses or thyromegaly. Lungs:  Clear throughout to auscultation.   No wheezes, crackles, or rhonchi. No acute distress. Heart:  Regular rate and rhythm; no murmurs, clicks, rubs,  or gallops. Abdomen:  Soft, nontender and nondistended. No masses, hepatosplenomegaly or hernias noted. Normal bowel sounds, without guarding, and without rebound.   Msk:  Symmetrical without gross deformities. Normal posture. Extremities:  Without clubbing or edema. Neurologic:  Alert and  oriented x4;  grossly normal neurologically. Skin:  Intact without significant lesions or rashes. Cervical Nodes:  No significant cervical  adenopathy. Psych:  Alert and cooperative. Normal mood and affect.  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  The risks of the procedure including infection, bleed, or perforation as well as benefits, limitations, alternatives and imponderables have been reviewed with the patient. Questions have been answered. All parties agreeable.

## 2021-10-04 NOTE — Anesthesia Preprocedure Evaluation (Signed)
Anesthesia Evaluation  Patient identified by MRN, date of birth, ID band Patient awake    Reviewed: Allergy & Precautions, H&P , NPO status , Patient's Chart, lab work & pertinent test results, reviewed documented beta blocker date and time   Airway Mallampati: II  TM Distance: >3 FB Neck ROM: full    Dental no notable dental hx.    Pulmonary neg pulmonary ROS,    Pulmonary exam normal breath sounds clear to auscultation       Cardiovascular Exercise Tolerance: Good hypertension, negative cardio ROS   Rhythm:regular Rate:Normal     Neuro/Psych negative neurological ROS  negative psych ROS   GI/Hepatic negative GI ROS, Neg liver ROS,   Endo/Other  negative endocrine ROS  Renal/GU negative Renal ROS  negative genitourinary   Musculoskeletal   Abdominal   Peds  Hematology  (+) Blood dyscrasia, anemia ,   Anesthesia Other Findings   Reproductive/Obstetrics negative OB ROS                             Anesthesia Physical Anesthesia Plan  ASA: 2  Anesthesia Plan: General   Post-op Pain Management:    Induction:   PONV Risk Score and Plan: Propofol infusion  Airway Management Planned:   Additional Equipment:   Intra-op Plan:   Post-operative Plan:   Informed Consent: I have reviewed the patients History and Physical, chart, labs and discussed the procedure including the risks, benefits and alternatives for the proposed anesthesia with the patient or authorized representative who has indicated his/her understanding and acceptance.     Dental Advisory Given  Plan Discussed with: CRNA  Anesthesia Plan Comments:         Anesthesia Quick Evaluation

## 2021-10-05 ENCOUNTER — Telehealth: Payer: Self-pay

## 2021-10-05 ENCOUNTER — Telehealth: Payer: Self-pay | Admitting: *Deleted

## 2021-10-05 NOTE — Telephone Encounter (Signed)
Pt called wanting to know if you made a decision on removing her ostomy bag. Please advise.

## 2021-10-05 NOTE — Telephone Encounter (Signed)
Pt was made aware and verbalized understanding.

## 2021-10-05 NOTE — Anesthesia Postprocedure Evaluation (Signed)
Anesthesia Post Note  Patient: Diana Fox  Procedure(s) Performed: COLONOSCOPY WITH PROPOFOL  Patient location during evaluation: Phase II Anesthesia Type: General Level of consciousness: awake Pain management: pain level controlled Vital Signs Assessment: post-procedure vital signs reviewed and stable Respiratory status: spontaneous breathing and respiratory function stable Cardiovascular status: blood pressure returned to baseline and stable Postop Assessment: no headache and no apparent nausea or vomiting Anesthetic complications: no Comments: Late entry   No notable events documented.   Last Vitals:  Vitals:   10/04/21 1116 10/04/21 1148  BP:  98/67  Pulse:  (!) 1  Resp: 14 (!) 22  Temp: (!) 36.2 C 36.4 C  SpO2: 100% 98%    Last Pain:  Vitals:   10/04/21 1148  TempSrc: Oral  PainSc: 0-No pain                 Louann Sjogren

## 2021-10-05 NOTE — Telephone Encounter (Signed)
Received call from patient.   Reports that colonoscopy was performed on 10/04/2021.   Inquired if she is now a candidate for colostomy reversal.   Please advise.

## 2021-10-05 NOTE — Telephone Encounter (Signed)
That will be up to her surgeon Dr Arnoldo Morale. She will need to schedule an appt with him to discuss further. Thank you

## 2021-10-06 ENCOUNTER — Inpatient Hospital Stay: Payer: Medicaid Other

## 2021-10-06 ENCOUNTER — Inpatient Hospital Stay: Payer: Medicaid Other | Attending: Hematology

## 2021-10-06 ENCOUNTER — Encounter: Payer: Self-pay | Admitting: *Deleted

## 2021-10-06 VITALS — BP 142/88 | HR 80 | Temp 98.6°F | Resp 18

## 2021-10-06 DIAGNOSIS — C779 Secondary and unspecified malignant neoplasm of lymph node, unspecified: Secondary | ICD-10-CM | POA: Insufficient documentation

## 2021-10-06 DIAGNOSIS — C187 Malignant neoplasm of sigmoid colon: Secondary | ICD-10-CM | POA: Insufficient documentation

## 2021-10-06 DIAGNOSIS — R21 Rash and other nonspecific skin eruption: Secondary | ICD-10-CM | POA: Insufficient documentation

## 2021-10-06 DIAGNOSIS — E876 Hypokalemia: Secondary | ICD-10-CM | POA: Diagnosis not present

## 2021-10-06 DIAGNOSIS — Z5111 Encounter for antineoplastic chemotherapy: Secondary | ICD-10-CM | POA: Insufficient documentation

## 2021-10-06 DIAGNOSIS — Z95828 Presence of other vascular implants and grafts: Secondary | ICD-10-CM

## 2021-10-06 DIAGNOSIS — C189 Malignant neoplasm of colon, unspecified: Secondary | ICD-10-CM

## 2021-10-06 DIAGNOSIS — D509 Iron deficiency anemia, unspecified: Secondary | ICD-10-CM | POA: Diagnosis not present

## 2021-10-06 LAB — CBC WITH DIFFERENTIAL/PLATELET
Abs Immature Granulocytes: 0.13 10*3/uL — ABNORMAL HIGH (ref 0.00–0.07)
Basophils Absolute: 0 10*3/uL (ref 0.0–0.1)
Basophils Relative: 1 %
Eosinophils Absolute: 0.3 10*3/uL (ref 0.0–0.5)
Eosinophils Relative: 4 %
HCT: 33.6 % — ABNORMAL LOW (ref 36.0–46.0)
Hemoglobin: 11.4 g/dL — ABNORMAL LOW (ref 12.0–15.0)
Immature Granulocytes: 2 %
Lymphocytes Relative: 40 %
Lymphs Abs: 2.7 10*3/uL (ref 0.7–4.0)
MCH: 31 pg (ref 26.0–34.0)
MCHC: 33.9 g/dL (ref 30.0–36.0)
MCV: 91.3 fL (ref 80.0–100.0)
Monocytes Absolute: 0.7 10*3/uL (ref 0.1–1.0)
Monocytes Relative: 11 %
Neutro Abs: 2.8 10*3/uL (ref 1.7–7.7)
Neutrophils Relative %: 42 %
Platelets: 174 10*3/uL (ref 150–400)
RBC: 3.68 MIL/uL — ABNORMAL LOW (ref 3.87–5.11)
RDW: 14.8 % (ref 11.5–15.5)
WBC: 6.6 10*3/uL (ref 4.0–10.5)
nRBC: 0 % (ref 0.0–0.2)

## 2021-10-06 LAB — COMPREHENSIVE METABOLIC PANEL
ALT: 20 U/L (ref 0–44)
AST: 19 U/L (ref 15–41)
Albumin: 3.5 g/dL (ref 3.5–5.0)
Alkaline Phosphatase: 77 U/L (ref 38–126)
Anion gap: 8 (ref 5–15)
BUN: 7 mg/dL (ref 6–20)
CO2: 21 mmol/L — ABNORMAL LOW (ref 22–32)
Calcium: 8.5 mg/dL — ABNORMAL LOW (ref 8.9–10.3)
Chloride: 110 mmol/L (ref 98–111)
Creatinine, Ser: 0.59 mg/dL (ref 0.44–1.00)
GFR, Estimated: >60 mL/min (ref >60)
Glucose, Bld: 91 mg/dL (ref 70–99)
Potassium: 2.9 mmol/L — ABNORMAL LOW (ref 3.5–5.1)
Sodium: 139 mmol/L (ref 135–145)
Total Bilirubin: 0.3 mg/dL (ref 0.3–1.2)
Total Protein: 6.9 g/dL (ref 6.5–8.1)

## 2021-10-06 LAB — MAGNESIUM: Magnesium: 1.7 mg/dL (ref 1.7–2.4)

## 2021-10-06 MED ORDER — OXALIPLATIN CHEMO INJECTION 100 MG/20ML
85.0000 mg/m2 | Freq: Once | INTRAVENOUS | Status: AC
  Administered 2021-10-06: 140 mg via INTRAVENOUS
  Filled 2021-10-06: qty 20

## 2021-10-06 MED ORDER — DEXTROSE 5 % IV SOLN: Freq: Once | INTRAVENOUS | Status: AC

## 2021-10-06 MED ORDER — SODIUM CHLORIDE 0.9 % IV SOLN
6.2000 mg/kg | Freq: Once | INTRAVENOUS | Status: AC
  Administered 2021-10-06: 400 mg via INTRAVENOUS
  Filled 2021-10-06: qty 20

## 2021-10-06 MED ORDER — ATROPINE SULFATE 1 MG/ML IV SOLN
0.5000 mg | Freq: Once | INTRAVENOUS | Status: AC
  Administered 2021-10-06: 0.5 mg via INTRAVENOUS
  Filled 2021-10-06: qty 1

## 2021-10-06 MED ORDER — PALONOSETRON HCL INJECTION 0.25 MG/5ML
0.2500 mg | Freq: Once | INTRAVENOUS | Status: AC
  Administered 2021-10-06: 0.25 mg via INTRAVENOUS
  Filled 2021-10-06: qty 5

## 2021-10-06 MED ORDER — SODIUM CHLORIDE 0.9 % IV SOLN: Freq: Once | INTRAVENOUS | Status: AC

## 2021-10-06 MED ORDER — POTASSIUM CHLORIDE CRYS ER 20 MEQ PO TBCR
40.0000 meq | EXTENDED_RELEASE_TABLET | Freq: Once | ORAL | Status: AC
  Administered 2021-10-06: 40 meq via ORAL
  Filled 2021-10-06: qty 2

## 2021-10-06 MED ORDER — LEUCOVORIN CALCIUM INJECTION 350 MG
200.0000 mg/m2 | Freq: Once | INTRAVENOUS | Status: AC
  Administered 2021-10-06: 332 mg via INTRAVENOUS
  Filled 2021-10-06: qty 16.6

## 2021-10-06 MED ORDER — SODIUM CHLORIDE 0.9 % IV SOLN
165.0000 mg/m2 | Freq: Once | INTRAVENOUS | Status: AC
  Administered 2021-10-06: 280 mg via INTRAVENOUS
  Filled 2021-10-06: qty 4

## 2021-10-06 MED ORDER — SODIUM CHLORIDE 0.9 % IV SOLN
150.0000 mg | Freq: Once | INTRAVENOUS | Status: AC
  Administered 2021-10-06: 150 mg via INTRAVENOUS
  Filled 2021-10-06: qty 150

## 2021-10-06 MED ORDER — SODIUM CHLORIDE 0.9 % IV SOLN
10.0000 mg | Freq: Once | INTRAVENOUS | Status: AC
  Administered 2021-10-06: 10 mg via INTRAVENOUS
  Filled 2021-10-06: qty 10

## 2021-10-06 MED ORDER — HEPARIN SOD (PORK) LOCK FLUSH 100 UNIT/ML IV SOLN: 500.0000 [IU] | Freq: Once | INTRAVENOUS | Status: DC | PRN

## 2021-10-06 MED ORDER — SODIUM CHLORIDE 0.9% FLUSH: 10.0000 mL | INTRAVENOUS | Status: DC | PRN

## 2021-10-06 MED ORDER — SODIUM CHLORIDE 0.9 % IV SOLN
2400.0000 mg/m2 | INTRAVENOUS | Status: DC
  Administered 2021-10-06: 4000 mg via INTRAVENOUS
  Filled 2021-10-06: qty 80

## 2021-10-06 NOTE — Progress Notes (Signed)
Patient presents today for Vectibix/FOLFIRINOX infusion per providers order.  Vital signs within parameters for treatment.  Labs pending.  Patient has no new complaints at this time.  Labs within parameters for treatment.  Potassium noted to be 2.9, patient will receive 40 mEq of oral potassium with treatment.  Treatment given today per MD orders.  Stable during infusion without adverse affects.  5FU pump start, verified RUN on the screen with the patient.  Vital signs stable.  No complaints at this time.  Discharge from clinic ambulatory in stable condition.  Alert and oriented X 3.  Follow up with [facility] as scheduled.

## 2021-10-06 NOTE — Progress Notes (Signed)
This encounter was created in error - please disregard.

## 2021-10-06 NOTE — Telephone Encounter (Signed)
Received call from patient. Inquired if Dr. Arnoldo Morale had reviewed colonoscopy. Advised that provider will advise once review has been completed.

## 2021-10-06 NOTE — Progress Notes (Signed)
Patients port flushed without difficulty.  Good blood return noted with no bruising or swelling noted at site.  Stable during access and blood draw.  Patient to remain accessed for treatment. 

## 2021-10-06 NOTE — Patient Instructions (Signed)
[facility]  Discharge Instructions: Thank you for choosing [facility] to provide your oncology and hematology care.  If you have a lab appointment with the [facility], please come in thru the Main Entrance and check in at the main information desk.  Wear comfortable clothing and clothing appropriate for easy access to any Portacath or PICC line.   We strive to give you quality time with your provider. You may need to reschedule your appointment if you arrive late (15 or more minutes).  Arriving late affects you and other patients whose appointments are after yours.  Also, if you miss three or more appointments without notifying the office, you may be dismissed from the clinic at the provider's discretion.      For prescription refill requests, have your pharmacy contact our office and allow 72 hours for refills to be completed.    Today you received the following chemotherapy and/or immunotherapy agents Vectibix/FOLFIRINOX      To help prevent nausea and vomiting after your treatment, we encourage you to take your nausea medication as directed.  BELOW ARE SYMPTOMS THAT SHOULD BE REPORTED IMMEDIATELY: *FEVER GREATER THAN 100.4 F (38 C) OR HIGHER *CHILLS OR SWEATING *NAUSEA AND VOMITING THAT IS NOT CONTROLLED WITH YOUR NAUSEA MEDICATION *UNUSUAL SHORTNESS OF BREATH *UNUSUAL BRUISING OR BLEEDING *URINARY PROBLEMS (pain or burning when urinating, or frequent urination) *BOWEL PROBLEMS (unusual diarrhea, constipation, pain near the anus) TENDERNESS IN MOUTH AND THROAT WITH OR WITHOUT PRESENCE OF ULCERS (sore throat, sores in mouth, or a toothache) UNUSUAL RASH, SWELLING OR PAIN  UNUSUAL VAGINAL DISCHARGE OR ITCHING   Items with * indicate a potential emergency and should be followed up as soon as possible or go to the Emergency Department if any problems should occur.  Please show the CHEMOTHERAPY ALERT CARD or IMMUNOTHERAPY ALERT CARD at check-in to  the Emergency Department and triage nurse.  Should you have questions after your visit or need to cancel or reschedule your appointment, please contact [facility] [phone]  and follow the prompts.  Office hours are 8:00 a.m. to 4:30 p.m. Monday - Friday. Please note that voicemails left after 4:00 p.m. may not be returned until the following business day.  We are closed weekends and major holidays. You have access to a nurse at all times for urgent questions. Please call the main number to the clinic [phone] and follow the prompts.  For any non-urgent questions, you may also contact your provider using MyChart. We now offer e-Visits for anyone 60 and older to request care online for non-urgent symptoms. For details visit [email or web address].   Also download the MyChart app! Go to the app store, search "MyChart", open the app, select [facility], and log in with your MyChart username and password.  Masks are optional in the cancer centers. If you would like for your care team to wear a mask while they are taking care of you, please let them know. You may have one support person who is at least 51 years old accompany you for your appointments.

## 2021-10-07 NOTE — Telephone Encounter (Signed)
Dr. Arnoldo Morale reviewed chart and recommendations are as follows: Case noted to be complex in nature.  Follow up with [facility], Dr. Crisoforo Oxford  Patient states that she has discussed her care with Dr. Crisoforo Oxford and was advised that extensive reversal is planned. States that she is concerned with the risks of complications from the planned procedure with Dr. Crisoforo Oxford.   Advised that Dr. Arnoldo Morale feels that case is much too complex for general surgery and she should continue with care from [facility].   Patient noted upset with providers decision. Patient stated that she was aggravated the Dr. Arnoldo Morale could not reverse colostomy as he was the one who created it.   Again advised that patient case is very complex and as we want her to have the best chance of reversal with no complications, she should remain under the care of specialty surgeon.

## 2021-10-08 ENCOUNTER — Inpatient Hospital Stay: Payer: Medicaid Other | Attending: Hematology

## 2021-10-08 ENCOUNTER — Encounter (HOSPITAL_COMMUNITY): Payer: Self-pay | Admitting: Internal Medicine

## 2021-10-08 VITALS — BP 123/80 | HR 74 | Temp 98.1°F | Resp 18

## 2021-10-08 DIAGNOSIS — Z5189 Encounter for other specified aftercare: Secondary | ICD-10-CM | POA: Diagnosis not present

## 2021-10-08 DIAGNOSIS — C189 Malignant neoplasm of colon, unspecified: Secondary | ICD-10-CM | POA: Insufficient documentation

## 2021-10-08 DIAGNOSIS — Z95828 Presence of other vascular implants and grafts: Secondary | ICD-10-CM

## 2021-10-08 MED ORDER — HEPARIN SOD (PORK) LOCK FLUSH 100 UNIT/ML IV SOLN
500.0000 [IU] | Freq: Once | INTRAVENOUS | Status: AC | PRN
  Administered 2021-10-08: 500 [IU]

## 2021-10-08 MED ORDER — PEGFILGRASTIM-CBQV 6 MG/0.6ML ~~LOC~~ SOSY
6.0000 mg | PREFILLED_SYRINGE | Freq: Once | SUBCUTANEOUS | Status: AC
  Administered 2021-10-08: 6 mg via SUBCUTANEOUS
  Filled 2021-10-08: qty 0.6

## 2021-10-08 MED ORDER — SODIUM CHLORIDE 0.9% FLUSH
10.0000 mL | INTRAVENOUS | Status: DC | PRN
  Administered 2021-10-08: 10 mL

## 2021-10-08 NOTE — Patient Instructions (Signed)
[facility]  Discharge Instructions: Thank you for choosing [facility] to provide your oncology and hematology care.  If you have a lab appointment with the [facility], please come in thru the Main Entrance and check in at the main information desk.  Wear comfortable clothing and clothing appropriate for easy access to any Portacath or PICC line.   We strive to give you quality time with your provider. You may need to reschedule your appointment if you arrive late (15 or more minutes).  Arriving late affects you and other patients whose appointments are after yours.  Also, if you miss three or more appointments without notifying the office, you may be dismissed from the clinic at the provider's discretion.      For prescription refill requests, have your pharmacy contact our office and allow 72 hours for refills to be completed.    Today you received pump disconnection and Udenyca injection.   BELOW ARE SYMPTOMS THAT SHOULD BE REPORTED IMMEDIATELY: *FEVER GREATER THAN 100.4 F (38 C) OR HIGHER *CHILLS OR SWEATING *NAUSEA AND VOMITING THAT IS NOT CONTROLLED WITH YOUR NAUSEA MEDICATION *UNUSUAL SHORTNESS OF BREATH *UNUSUAL BRUISING OR BLEEDING *URINARY PROBLEMS (pain or burning when urinating, or frequent urination) *BOWEL PROBLEMS (unusual diarrhea, constipation, pain near the anus) TENDERNESS IN MOUTH AND THROAT WITH OR WITHOUT PRESENCE OF ULCERS (sore throat, sores in mouth, or a toothache) UNUSUAL RASH, SWELLING OR PAIN  UNUSUAL VAGINAL DISCHARGE OR ITCHING   Items with * indicate a potential emergency and should be followed up as soon as possible or go to the Emergency Department if any problems should occur.  Please show the CHEMOTHERAPY ALERT CARD or IMMUNOTHERAPY ALERT CARD at check-in to the Emergency Department and triage nurse.  Should you have questions after your visit or need to cancel or reschedule your appointment, please contact  [facility] [phone]  and follow the prompts.  Office hours are 8:00 a.m. to 4:30 p.m. Monday - Friday. Please note that voicemails left after 4:00 p.m. may not be returned until the following business day.  We are closed weekends and major holidays. You have access to a nurse at all times for urgent questions. Please call the main number to the clinic [phone] and follow the prompts.  For any non-urgent questions, you may also contact your provider using MyChart. We now offer e-Visits for anyone 59 and older to request care online for non-urgent symptoms. For details visit [email or web address].   Also download the MyChart app! Go to the app store, search "MyChart", open the app, select [facility], and log in with your MyChart username and password.  Masks are optional in the cancer centers. If you would like for your care team to wear a mask while they are taking care of you, please let them know. You may have one support person who is at least 51 years old accompany you for your appointments.

## 2021-10-08 NOTE — Progress Notes (Signed)
Pt presents today for 5FU pump disconnection and Udenyca injection per provider's order. Vital signs stable. Port flushed easily without difficulty with 10 mL of normal saline and 5 mL of heparin. Good blood return noted and needle removed intact. No bruising or swelling noted at the site.  Discharged from clinic ambulatory in stable condition. Alert and oriented x 3. F/U with [facility] as scheduled.

## 2021-10-10 ENCOUNTER — Other Ambulatory Visit: Payer: Self-pay | Admitting: Hematology

## 2021-10-10 DIAGNOSIS — C189 Malignant neoplasm of colon, unspecified: Secondary | ICD-10-CM

## 2021-10-10 DIAGNOSIS — Z95828 Presence of other vascular implants and grafts: Secondary | ICD-10-CM

## 2021-10-20 ENCOUNTER — Inpatient Hospital Stay: Payer: Medicaid Other

## 2021-10-21 ENCOUNTER — Other Ambulatory Visit: Payer: Self-pay

## 2021-10-22 ENCOUNTER — Inpatient Hospital Stay: Payer: Medicaid Other

## 2021-10-26 ENCOUNTER — Telehealth: Payer: Self-pay

## 2021-10-26 ENCOUNTER — Telehealth: Payer: Self-pay | Admitting: *Deleted

## 2021-10-26 NOTE — Telephone Encounter (Signed)
Spoke to pt. Sent to note to provider

## 2021-10-26 NOTE — Telephone Encounter (Signed)
Pt called and states, she spoke with Dr. 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 Pt would like for provider to call her and let her know other options.

## 2021-10-26 NOTE — Telephone Encounter (Signed)
Pt LMOVM to call her. She last seen [facility] in June. Pt did not say what it was regarding.

## 2021-10-28 NOTE — Telephone Encounter (Signed)
LMOM for pt to call office.

## 2021-10-28 NOTE — Telephone Encounter (Signed)
Noted  

## 2021-11-03 ENCOUNTER — Inpatient Hospital Stay (HOSPITAL_BASED_OUTPATIENT_CLINIC_OR_DEPARTMENT_OTHER): Payer: Medicaid Other | Admitting: Hematology

## 2021-11-03 ENCOUNTER — Inpatient Hospital Stay: Payer: Medicaid Other

## 2021-11-03 VITALS — BP 143/80 | HR 68 | Temp 98.6°F | Resp 18

## 2021-11-03 DIAGNOSIS — C189 Malignant neoplasm of colon, unspecified: Secondary | ICD-10-CM

## 2021-11-03 DIAGNOSIS — Z95828 Presence of other vascular implants and grafts: Secondary | ICD-10-CM

## 2021-11-03 LAB — CBC WITH DIFFERENTIAL/PLATELET
Abs Immature Granulocytes: 0.01 10*3/uL (ref 0.00–0.07)
Basophils Absolute: 0.1 10*3/uL (ref 0.0–0.1)
Basophils Relative: 1 %
Eosinophils Absolute: 0.2 10*3/uL (ref 0.0–0.5)
Eosinophils Relative: 3 %
HCT: 34.7 % — ABNORMAL LOW (ref 36.0–46.0)
Hemoglobin: 11.6 g/dL — ABNORMAL LOW (ref 12.0–15.0)
Immature Granulocytes: 0 %
Lymphocytes Relative: 47 %
Lymphs Abs: 2.3 10*3/uL (ref 0.7–4.0)
MCH: 30.9 pg (ref 26.0–34.0)
MCHC: 33.4 g/dL (ref 30.0–36.0)
MCV: 92.5 fL (ref 80.0–100.0)
Monocytes Absolute: 0.6 10*3/uL (ref 0.1–1.0)
Monocytes Relative: 13 %
Neutro Abs: 1.8 10*3/uL (ref 1.7–7.7)
Neutrophils Relative %: 36 %
Platelets: 271 10*3/uL (ref 150–400)
RBC: 3.75 MIL/uL — ABNORMAL LOW (ref 3.87–5.11)
RDW: 16 % — ABNORMAL HIGH (ref 11.5–15.5)
WBC: 5 10*3/uL (ref 4.0–10.5)
nRBC: 0 % (ref 0.0–0.2)

## 2021-11-03 LAB — COMPREHENSIVE METABOLIC PANEL
ALT: 22 U/L (ref 0–44)
AST: 25 U/L (ref 15–41)
Albumin: 3.6 g/dL (ref 3.5–5.0)
Alkaline Phosphatase: 57 U/L (ref 38–126)
Anion gap: 5 (ref 5–15)
BUN: 8 mg/dL (ref 6–20)
CO2: 22 mmol/L (ref 22–32)
Calcium: 8.8 mg/dL — ABNORMAL LOW (ref 8.9–10.3)
Chloride: 111 mmol/L (ref 98–111)
Creatinine, Ser: 0.65 mg/dL (ref 0.44–1.00)
GFR, Estimated: >60 mL/min (ref >60)
Glucose, Bld: 88 mg/dL (ref 70–99)
Potassium: 3.9 mmol/L (ref 3.5–5.1)
Sodium: 138 mmol/L (ref 135–145)
Total Bilirubin: 0.7 mg/dL (ref 0.3–1.2)
Total Protein: 7 g/dL (ref 6.5–8.1)

## 2021-11-03 LAB — MAGNESIUM: Magnesium: 1.9 mg/dL (ref 1.7–2.4)

## 2021-11-03 MED ORDER — SODIUM CHLORIDE 0.9 % IV SOLN
2400.0000 mg/m2 | INTRAVENOUS | Status: DC
  Administered 2021-11-03: 4000 mg via INTRAVENOUS
  Filled 2021-11-03: qty 80

## 2021-11-03 MED ORDER — SODIUM CHLORIDE 0.9 % IV SOLN
150.0000 mg | Freq: Once | INTRAVENOUS | Status: AC
  Administered 2021-11-03: 150 mg via INTRAVENOUS
  Filled 2021-11-03: qty 150

## 2021-11-03 MED ORDER — PALONOSETRON HCL INJECTION 0.25 MG/5ML
0.2500 mg | Freq: Once | INTRAVENOUS | Status: AC
  Administered 2021-11-03: 0.25 mg via INTRAVENOUS
  Filled 2021-11-03: qty 5

## 2021-11-03 MED ORDER — DEXTROSE 5 % IV SOLN: Freq: Once | INTRAVENOUS | Status: AC

## 2021-11-03 MED ORDER — SODIUM CHLORIDE 0.9 % IV SOLN
165.0000 mg/m2 | Freq: Once | INTRAVENOUS | Status: AC
  Administered 2021-11-03: 280 mg via INTRAVENOUS
  Filled 2021-11-03: qty 10

## 2021-11-03 MED ORDER — OXALIPLATIN CHEMO INJECTION 100 MG/20ML
85.0000 mg/m2 | Freq: Once | INTRAVENOUS | Status: AC
  Administered 2021-11-03: 140 mg via INTRAVENOUS
  Filled 2021-11-03: qty 20

## 2021-11-03 MED ORDER — LEUCOVORIN CALCIUM INJECTION 350 MG
200.0000 mg/m2 | Freq: Once | INTRAVENOUS | Status: AC
  Administered 2021-11-03: 332 mg via INTRAVENOUS
  Filled 2021-11-03: qty 16.6

## 2021-11-03 MED ORDER — SODIUM CHLORIDE 0.9 % IV SOLN: Freq: Once | INTRAVENOUS | Status: AC

## 2021-11-03 MED ORDER — SODIUM CHLORIDE 0.9 % IV SOLN
6.0000 mg/kg | Freq: Once | INTRAVENOUS | Status: AC
  Administered 2021-11-03: 400 mg via INTRAVENOUS
  Filled 2021-11-03: qty 20

## 2021-11-03 MED ORDER — ATROPINE SULFATE 1 MG/ML IV SOLN
0.5000 mg | Freq: Once | INTRAVENOUS | Status: AC
  Administered 2021-11-03: 0.5 mg via INTRAVENOUS
  Filled 2021-11-03: qty 1

## 2021-11-03 MED ORDER — SODIUM CHLORIDE 0.9 % IV SOLN
10.0000 mg | Freq: Once | INTRAVENOUS | Status: AC
  Administered 2021-11-03: 10 mg via INTRAVENOUS
  Filled 2021-11-03: qty 10

## 2021-11-03 NOTE — Patient Instructions (Addendum)
[facility]  Discharge Instructions  You were seen and examined today by Dr. Delton Coombes.  Please request a telephone visit with Dr. Crisoforo Oxford prior to making any final decisions regarding surgery.  Proceed with treatment as scheduled.  Follow-up as scheduled with a CT scan prior to your next visit.   Thank you for choosing [facility] to provide your oncology and hematology care.   To afford each patient quality time with our provider, please arrive at least 15 minutes before your scheduled appointment time. You may need to reschedule your appointment if you arrive late (10 or more minutes). Arriving late affects you and other patients whose appointments are after yours.  Also, if you miss three or more appointments without notifying the office, you may be dismissed from the clinic at the provider's discretion.    Again, thank you for choosing [facility].  Our hope is that these requests will decrease the amount of time that you wait before being seen by our physicians.   If you have a lab appointment with the [facility] please come in thru the Main Entrance and check in at the main information desk.           _____________________________________________________________  Should you have questions after your visit to [facility], please contact our office at [phone] and follow the prompts.  Our office hours are 8:00 a.m. to 4:30 p.m. Monday - Thursday and 8:00 a.m. to 2:30 p.m. Friday.  Please note that voicemails left after 4:00 p.m. may not be returned until the following business day.  We are closed weekends and all major holidays.  You do have access to a nurse 24-7, just call the main number to the clinic [phone] and do not press any options, hold on the line and a nurse will answer the phone.    For prescription refill requests, have your pharmacy contact our office and allow 72 hours.     Masks are optional in the cancer centers. If you would like for your care team to wear a mask while they are taking care of you, please let them know. You may have one support person who is at least 51 years old accompany you for your appointments.

## 2021-11-03 NOTE — Progress Notes (Signed)
Patient has been assessed, vital signs and labs have been reviewed by Dr. Katragadda. ANC, Creatinine, LFTs, and Platelets are within treatment parameters per Dr. Katragadda. The patient is good to proceed with treatment at this time. Primary RN and pharmacy aware.  

## 2021-11-03 NOTE — Patient Instructions (Signed)
[place]  Discharge Instructions: Thank you for choosing [facility] to provide your oncology and hematology care.  If you have a lab appointment with the [facility], please come in thru the Main Entrance and check in at the main information desk.  Wear comfortable clothing and clothing appropriate for easy access to any Portacath or PICC line.   We strive to give you quality time with your provider. You may need to reschedule your appointment if you arrive late (15 or more minutes).  Arriving late affects you and other patients whose appointments are after yours.  Also, if you miss three or more appointments without notifying the office, you may be dismissed from the clinic at the provider's discretion.      For prescription refill requests, have your pharmacy contact our office and allow 72 hours for refills to be completed.    Today you received the following chemotherapy and/or immunotherapy agents Vectibix/Irinotecan/Oxaliplatin/Leucovorin/Fluorouracil.   Panitumumab Injection What is this medication? PANITUMUMAB (pan i TOOM ue mab) treats colorectal cancer. It works by blocking a protein that causes cancer cells to grow and multiply. This helps to slow or stop the spread of cancer cells. It is a monoclonal antibody. This medicine may be used for other purposes; ask your health care provider or pharmacist if you have questions. COMMON BRAND NAME(S): Vectibix What should I tell my care team before I take this medication? They need to know if you have any of these conditions: Eye disease Low levels of magnesium in the blood Lung disease An unusual or allergic reaction to panitumumab, other medications, foods, dyes, or preservatives Pregnant or trying to get pregnant Breast-feeding How should I use this medication? This medication is injected into a vein. It is given by your care team in a hospital or clinic setting. Talk to your care team about  the use of this medication in children. Special care may be needed. Overdosage: If you think you have taken too much of this medicine contact a poison control center or emergency room at once. NOTE: This medicine is only for you. Do not share this medicine with others. What if I miss a dose? Keep appointments for follow-up doses. It is important not to miss your dose. Call your care team if you are unable to keep an appointment. What may interact with this medication? Bevacizumab This list may not describe all possible interactions. Give your health care provider a list of all the medicines, herbs, non-prescription drugs, or dietary supplements you use. Also tell them if you smoke, drink alcohol, or use illegal drugs. Some items may interact with your medicine. What should I watch for while using this medication? Your condition will be monitored carefully while you are receiving this medication. This medication may make you feel generally unwell. This is not uncommon as chemotherapy can affect healthy cells as well as cancer cells. Report any side effects. Continue your course of treatment even though you feel ill unless your care team tells you to stop. This medication can make you more sensitive to the sun. Keep out of the sun while receiving this medication and for 2 months after stopping therapy. If you cannot avoid being in the sun, wear protective clothing and sunscreen. Do not use sun lamps, tanning beds, or tanning booths. Check with your care team if you have severe diarrhea, nausea, and vomiting or if you sweat a lot. The loss of too much body fluid may make it dangerous  for you to take this medication. This medication may cause serious skin reactions. They can happen weeks to months after starting the medication. Contact your care team right away if you notice fevers or flu-like symptoms with a rash. The rash may be red or purple and then turn into blisters or peeling of the skin. You may  also notice a red rash with swelling of the face, lips, or lymph nodes in your neck or under your arms. Talk to your care team if you may be pregnant. Serious birth defects can occur if you take this medication during pregnancy and for 2 months after the last dose. Contraception is recommended while taking this medication and for 2 months after the last dose. Your care team can help you find the option that works for you. Do not breastfeed while taking this medication and for 2 months after the last dose. This medication may cause infertility. Talk to your care team if you are concerned about your fertility. What side effects may I notice from receiving this medication? Side effects that you should report to your care team as soon as possible: Allergic reactions--skin rash, itching, hives, swelling of the face, lips, tongue, or throat Dry cough, shortness of breath or trouble breathing Eye pain, redness, irritation, or discharge with blurry or decreased vision Infusion reactions--chest pain, shortness of breath or trouble breathing, feeling faint or lightheaded Low magnesium level--muscle pain or cramps, unusual weakness or fatigue, fast or irregular heartbeat, tremors Low potassium level--muscle pain or cramps, unusual weakness or fatigue, fast or irregular heartbeat, constipation Redness, blistering, peeling, or loosening of the skin, including inside the mouth Skin reactions on sun-exposed areas Side effects that usually do not require medical attention (report to your care team if they continue or are bothersome): Change in nail shape, thickness, or color Diarrhea Dry skin Fatigue Nausea Vomiting This list may not describe all possible side effects. Call your doctor for medical advice about side effects. You may report side effects to FDA at 1-800-FDA-1088. Where should I keep my medication? This medication is given in a hospital or clinic. It will not be stored at home. NOTE: This sheet  is a summary. It may not cover all possible information. If you have questions about this medicine, talk to your doctor, pharmacist, or health care provider.  2023 Elsevier/Gold Standard (2021-05-19 00:00:00)   Irinotecan Injection What is this medication? IRINOTECAN (ir in oh TEE kan) treats some types of cancer. It works by slowing down the growth of cancer cells. This medicine may be used for other purposes; ask your health care provider or pharmacist if you have questions. COMMON BRAND NAME(S): Camptosar What should I tell my care team before I take this medication? They need to know if you have any of these conditions: Dehydration Diarrhea Infection, especially a viral infection, such as chickenpox, cold sores, herpes Liver disease Low blood cell levels (white cells, red cells, and platelets) Low levels of electrolytes, such as calcium, magnesium, or potassium in your blood Recent or ongoing radiation An unusual or allergic reaction to irinotecan, other medications, foods, dyes, or preservatives If you or your partner are pregnant or trying to get pregnant Breast-feeding How should I use this medication? This medication is injected into a vein. It is given by your care team in a hospital or clinic setting. Talk to your care team about the use of this medication in children. Special care may be needed. Overdosage: If you think you have taken too much  of this medicine contact a poison control center or emergency room at once. NOTE: This medicine is only for you. Do not share this medicine with others. What if I miss a dose? Keep appointments for follow-up doses. It is important not to miss your dose. Call your care team if you are unable to keep an appointment. What may interact with this medication? Do not take this medication with any of the following: Cobicistat Itraconazole This medication may also interact with the following: Certain antibiotics, such as clarithromycin,  rifampin, rifabutin Certain antivirals for HIV or AIDS Certain medications for fungal infections, such as ketoconazole, posaconazole, voriconazole Certain medications for seizures, such as carbamazepine, phenobarbital, phenytoin Gemfibrozil Nefazodone St. John's wort This list may not describe all possible interactions. Give your health care provider a list of all the medicines, herbs, non-prescription drugs, or dietary supplements you use. Also tell them if you smoke, drink alcohol, or use illegal drugs. Some items may interact with your medicine. What should I watch for while using this medication? Your condition will be monitored carefully while you are receiving this medication. You may need blood work while taking this medication. This medication may make you feel generally unwell. This is not uncommon as chemotherapy can affect healthy cells as well as cancer cells. Report any side effects. Continue your course of treatment even though you feel ill unless your care team tells you to stop. This medication can cause serious side effects. To reduce the risk, your care team may give you other medications to take before receiving this one. Be sure to follow the directions from your care team. This medication may affect your coordination, reaction time, or judgement. Do not drive or operate machinery until you know how this medication affects you. Sit up or stand slowly to reduce the risk of dizzy or fainting spells. Drinking alcohol with this medication can increase the risk of these side effects. This medication may increase your risk of getting an infection. Call your care team for advice if you get a fever, chills, sore throat, or other symptoms of a cold or flu. Do not treat yourself. Try to avoid being around people who are sick. Avoid taking medications that contain aspirin, acetaminophen, ibuprofen, naproxen, or ketoprofen unless instructed by your care team. These medications may hide a  fever. This medication may increase your risk to bruise or bleed. Call your care team if you notice any unusual bleeding. Be careful brushing or flossing your teeth or using a toothpick because you may get an infection or bleed more easily. If you have any dental work done, tell your dentist you are receiving this medication. Talk to your care team if you or your partner are pregnant or think either of you might be pregnant. This medication can cause serious birth defects if taken during pregnancy and for 6 months after the last dose. You will need a negative pregnancy test before starting this medication. Contraception is recommended while taking this medication and for 6 months after the last dose. Your care team can help you find the option that works for you. Do not father a child while taking this medication and for 3 months after the last dose. Use a condom for contraception during this time period. Do not breastfeed while taking this medication and for 7 days after the last dose. This medication may cause infertility. Talk to your care team if you are concerned about your fertility. What side effects may I notice from receiving this medication?  Side effects that you should report to your care team as soon as possible: Allergic reactions--skin rash, itching, hives, swelling of the face, lips, tongue, or throat Dry cough, shortness of breath or trouble breathing Increased saliva or tears, increased sweating, stomach cramping, diarrhea, small pupils, unusual weakness or fatigue, slow heartbeat Infection--fever, chills, cough, sore throat, wounds that don't heal, pain or trouble when passing urine, general feeling of discomfort or being unwell Kidney injury--decrease in the amount of urine, swelling of the ankles, hands, or feet Low red blood cell level--unusual weakness or fatigue, dizziness, headache, trouble breathing Severe or prolonged diarrhea Unusual bruising or bleeding Side effects that  usually do not require medical attention (report to your care team if they continue or are bothersome): Constipation Diarrhea Hair loss Loss of appetite Nausea Stomach pain This list may not describe all possible side effects. Call your doctor for medical advice about side effects. You may report side effects to FDA at 1-800-FDA-1088. Where should I keep my medication? This medication is given in a hospital or clinic. It will not be stored at home. NOTE: This sheet is a summary. It may not cover all possible information. If you have questions about this medicine, talk to your doctor, pharmacist, or health care provider.  2023 Elsevier/Gold Standard (2021-06-03 00:00:00)   Oxaliplatin Injection What is this medication? OXALIPLATIN (ox AL i PLA tin) treats some types of cancer. It works by slowing down the growth of cancer cells. This medicine may be used for other purposes; ask your health care provider or pharmacist if you have questions. COMMON BRAND NAME(S): Eloxatin What should I tell my care team before I take this medication? They need to know if you have any of these conditions: Heart disease History of irregular heartbeat or rhythm Liver disease Low blood cell levels (white cells, red cells, and platelets) Lung or breathing disease, such as asthma Take medications that treat or prevent blood clots Tingling of the fingers, toes, or other nerve disorder An unusual or allergic reaction to oxaliplatin, other medications, foods, dyes, or preservatives If you or your partner are pregnant or trying to get pregnant Breast-feeding How should I use this medication? This medication is injected into a vein. It is given by your care team in a hospital or clinic setting. Talk to your care team about the use of this medication in children. Special care may be needed. Overdosage: If you think you have taken too much of this medicine contact a poison control center or emergency room at  once. NOTE: This medicine is only for you. Do not share this medicine with others. What if I miss a dose? Keep appointments for follow-up doses. It is important not to miss a dose. Call your care team if you are unable to keep an appointment. What may interact with this medication? Do not take this medication with any of the following: Cisapride Dronedarone Pimozide Thioridazine This medication may also interact with the following: Aspirin and aspirin-like medications Certain medications that treat or prevent blood clots, such as warfarin, apixaban, dabigatran, and rivaroxaban Cisplatin Cyclosporine Diuretics Medications for infection, such as acyclovir, adefovir, amphotericin B, bacitracin, cidofovir, foscarnet, ganciclovir, gentamicin, pentamidine, vancomycin NSAIDs, medications for pain and inflammation, such as ibuprofen or naproxen Other medications that cause heart rhythm changes Pamidronate Zoledronic acid This list may not describe all possible interactions. Give your health care provider a list of all the medicines, herbs, non-prescription drugs, or dietary supplements you use. Also tell them if you smoke,  drink alcohol, or use illegal drugs. Some items may interact with your medicine. What should I watch for while using this medication? Your condition will be monitored carefully while you are receiving this medication. You may need blood work while taking this medication. This medication may make you feel generally unwell. This is not uncommon as chemotherapy can affect healthy cells as well as cancer cells. Report any side effects. Continue your course of treatment even though you feel ill unless your care team tells you to stop. This medication may increase your risk of getting an infection. Call your care team for advice if you get a fever, chills, sore throat, or other symptoms of a cold or flu. Do not treat yourself. Try to avoid being around people who are sick. Avoid  taking medications that contain aspirin, acetaminophen, ibuprofen, naproxen, or ketoprofen unless instructed by your care team. These medications may hide a fever. Be careful brushing or flossing your teeth or using a toothpick because you may get an infection or bleed more easily. If you have any dental work done, tell your dentist you are receiving this medication. This medication can make you more sensitive to cold. Do not drink cold drinks or use ice. Cover exposed skin before coming in contact with cold temperatures or cold objects. When out in cold weather wear warm clothing and cover your mouth and nose to warm the air that goes into your lungs. Tell your care team if you get sensitive to the cold. Talk to your care team if you or your partner are pregnant or think either of you might be pregnant. This medication can cause serious birth defects if taken during pregnancy and for 9 months after the last dose. A negative pregnancy test is required before starting this medication. A reliable form of contraception is recommended while taking this medication and for 9 months after the last dose. Talk to your care team about effective forms of contraception. Do not father a child while taking this medication and for 6 months after the last dose. Use a condom while having sex during this time period. Do not breastfeed while taking this medication and for 3 months after the last dose. This medication may cause infertility. Talk to your care team if you are concerned about your fertility. What side effects may I notice from receiving this medication? Side effects that you should report to your care team as soon as possible: Allergic reactions--skin rash, itching, hives, swelling of the face, lips, tongue, or throat Bleeding--bloody or black, tar-like stools, vomiting blood or Collyn Ribas material that looks like coffee grounds, red or dark Romie Tay urine, small red or purple spots on skin, unusual bruising or  bleeding Dry cough, shortness of breath or trouble breathing Heart rhythm changes--fast or irregular heartbeat, dizziness, feeling faint or lightheaded, chest pain, trouble breathing Infection--fever, chills, cough, sore throat, wounds that don't heal, pain or trouble when passing urine, general feeling of discomfort or being unwell Liver injury--right upper belly pain, loss of appetite, nausea, light-colored stool, dark yellow or Samanthajo Payano urine, yellowing skin or eyes, unusual weakness or fatigue Low red blood cell level--unusual weakness or fatigue, dizziness, headache, trouble breathing Muscle injury--unusual weakness or fatigue, muscle pain, dark yellow or Nykeria Mealing urine, decrease in amount of urine Pain, tingling, or numbness in the hands or feet Sudden and severe headache, confusion, change in vision, seizures, which may be signs of posterior reversible encephalopathy syndrome (PRES) Unusual bruising or bleeding Side effects that usually do not require  medical attention (report to your care team if they continue or are bothersome): Diarrhea Nausea Pain, redness, or swelling with sores inside the mouth or throat Unusual weakness or fatigue Vomiting This list may not describe all possible side effects. Call your doctor for medical advice about side effects. You may report side effects to FDA at 1-800-FDA-1088. Where should I keep my medication? This medication is given in a hospital or clinic. It will not be stored at home. NOTE: This sheet is a summary. It may not cover all possible information. If you have questions about this medicine, talk to your doctor, pharmacist, or health care provider.  2023 Elsevier/Gold Standard (2021-05-21 00:00:00)   Leucovorin Injection What is this medication? LEUCOVORIN (loo koe VOR in) prevents side effects from certain medications, such as methotrexate. It works by increasing folate levels. This helps protect healthy cells in your body. It may also be used  to treat anemia caused by low levels of folate. It can also be used with fluorouracil, a type of chemotherapy, to treat colorectal cancer. It works by increasing the effects of fluorouracil in the body. This medicine may be used for other purposes; ask your health care provider or pharmacist if you have questions. What should I tell my care team before I take this medication? They need to know if you have any of these conditions: Anemia from low levels of vitamin B12 in the blood An unusual or allergic reaction to leucovorin, folic acid, other medications, foods, dyes, or preservatives Pregnant or trying to get pregnant Breastfeeding How should I use this medication? This medication is injected into a vein or a muscle. It is given by your care team in a hospital or clinic setting. Talk to your care team about the use of this medication in children. Special care may be needed. Overdosage: If you think you have taken too much of this medicine contact a poison control center or emergency room at once. NOTE: This medicine is only for you. Do not share this medicine with others. What if I miss a dose? Keep appointments for follow-up doses. It is important not to miss your dose. Call your care team if you are unable to keep an appointment. What may interact with this medication? Capecitabine Fluorouracil Phenobarbital Phenytoin Primidone Trimethoprim;sulfamethoxazole This list may not describe all possible interactions. Give your health care provider a list of all the medicines, herbs, non-prescription drugs, or dietary supplements you use. Also tell them if you smoke, drink alcohol, or use illegal drugs. Some items may interact with your medicine. What should I watch for while using this medication? Your condition will be monitored carefully while you are receiving this medication. This medication may increase the side effects of 5-fluorouracil. Tell your care team if you have diarrhea or mouth  sores that do not get better or that get worse. What side effects may I notice from receiving this medication? Side effects that you should report to your care team as soon as possible: Allergic reactions--skin rash, itching, hives, swelling of the face, lips, tongue, or throat This list may not describe all possible side effects. Call your doctor for medical advice about side effects. You may report side effects to FDA at 1-800-FDA-1088. Where should I keep my medication? This medication is given in a hospital or clinic. It will not be stored at home. NOTE: This sheet is a summary. It may not cover all possible information. If you have questions about this medicine, talk to your doctor, pharmacist,  or health care provider.  2023 Elsevier/Gold Standard (2021-06-04 00:00:00)   Fluorouracil Injection What is this medication? FLUOROURACIL (flure oh YOOR a sil) treats some types of cancer. It works by slowing down the growth of cancer cells. This medicine may be used for other purposes; ask your health care provider or pharmacist if you have questions. COMMON BRAND NAME(S): Adrucil What should I tell my care team before I take this medication? They need to know if you have any of these conditions: Blood disorders Dihydropyrimidine dehydrogenase (DPD) deficiency Infection, such as chickenpox, cold sores, herpes Kidney disease Liver disease Poor nutrition Recent or ongoing radiation therapy An unusual or allergic reaction to fluorouracil, other medications, foods, dyes, or preservatives If you or your partner are pregnant or trying to get pregnant Breast-feeding How should I use this medication? This medication is injected into a vein. It is administered by your care team in a hospital or clinic setting. Talk to your care team about the use of this medication in children. Special care may be needed. Overdosage: If you think you have taken too much of this medicine contact a poison control  center or emergency room at once. NOTE: This medicine is only for you. Do not share this medicine with others. What if I miss a dose? Keep appointments for follow-up doses. It is important not to miss your dose. Call your care team if you are unable to keep an appointment. What may interact with this medication? Do not take this medication with any of the following: Live virus vaccines This medication may also interact with the following: Medications that treat or prevent blood clots, such as warfarin, enoxaparin, dalteparin This list may not describe all possible interactions. Give your health care provider a list of all the medicines, herbs, non-prescription drugs, or dietary supplements you use. Also tell them if you smoke, drink alcohol, or use illegal drugs. Some items may interact with your medicine. What should I watch for while using this medication? Your condition will be monitored carefully while you are receiving this medication. This medication may make you feel generally unwell. This is not uncommon as chemotherapy can affect healthy cells as well as cancer cells. Report any side effects. Continue your course of treatment even though you feel ill unless your care team tells you to stop. In some cases, you may be given additional medications to help with side effects. Follow all directions for their use. This medication may increase your risk of getting an infection. Call your care team for advice if you get a fever, chills, sore throat, or other symptoms of a cold or flu. Do not treat yourself. Try to avoid being around people who are sick. This medication may increase your risk to bruise or bleed. Call your care team if you notice any unusual bleeding. Be careful brushing or flossing your teeth or using a toothpick because you may get an infection or bleed more easily. If you have any dental work done, tell your dentist you are receiving this medication. Avoid taking medications that  contain aspirin, acetaminophen, ibuprofen, naproxen, or ketoprofen unless instructed by your care team. These medications may hide a fever. Do not treat diarrhea with over the counter products. Contact your care team if you have diarrhea that lasts more than 2 days or if it is severe and watery. This medication can make you more sensitive to the sun. Keep out of the sun. If you cannot avoid being in the sun, wear protective clothing and  sunscreen. Do not use sun lamps, tanning beds, or tanning booths. Talk to your care team if you or your partner wish to become pregnant or think you might be pregnant. This medication can cause serious birth defects if taken during pregnancy and for 3 months after the last dose. A reliable form of contraception is recommended while taking this medication and for 3 months after the last dose. Talk to your care team about effective forms of contraception. Do not father a child while taking this medication and for 3 months after the last dose. Use a condom while having sex during this time period. Do not breastfeed while taking this medication. This medication may cause infertility. Talk to your care team if you are concerned about your fertility. What side effects may I notice from receiving this medication? Side effects that you should report to your care team as soon as possible: Allergic reactions--skin rash, itching, hives, swelling of the face, lips, tongue, or throat Heart attack--pain or tightness in the chest, shoulders, arms, or jaw, nausea, shortness of breath, cold or clammy skin, feeling faint or lightheaded Heart failure--shortness of breath, swelling of the ankles, feet, or hands, sudden weight gain, unusual weakness or fatigue Heart rhythm changes--fast or irregular heartbeat, dizziness, feeling faint or lightheaded, chest pain, trouble breathing High ammonia level--unusual weakness or fatigue, confusion, loss of appetite, nausea, vomiting,  seizures Infection--fever, chills, cough, sore throat, wounds that don't heal, pain or trouble when passing urine, general feeling of discomfort or being unwell Low red blood cell level--unusual weakness or fatigue, dizziness, headache, trouble breathing Pain, tingling, or numbness in the hands or feet, muscle weakness, change in vision, confusion or trouble speaking, loss of balance or coordination, trouble walking, seizures Redness, swelling, and blistering of the skin over hands and feet Severe or prolonged diarrhea Unusual bruising or bleeding Side effects that usually do not require medical attention (report to your care team if they continue or are bothersome): Dry skin Headache Increased tears Nausea Pain, redness, or swelling with sores inside the mouth or throat Sensitivity to light Vomiting This list may not describe all possible side effects. Call your doctor for medical advice about side effects. You may report side effects to FDA at 1-800-FDA-1088. Where should I keep my medication? This medication is given in a hospital or clinic. It will not be stored at home. NOTE: This sheet is a summary. It may not cover all possible information. If you have questions about this medicine, talk to your doctor, pharmacist, or health care provider.  2023 Elsevier/Gold Standard (2021-06-01 00:00:00)        To help prevent nausea and vomiting after your treatment, we encourage you to take your nausea medication as directed.  BELOW ARE SYMPTOMS THAT SHOULD BE REPORTED IMMEDIATELY: *FEVER GREATER THAN 100.4 F (38 C) OR HIGHER *CHILLS OR SWEATING *NAUSEA AND VOMITING THAT IS NOT CONTROLLED WITH YOUR NAUSEA MEDICATION *UNUSUAL SHORTNESS OF BREATH *UNUSUAL BRUISING OR BLEEDING *URINARY PROBLEMS (pain or burning when urinating, or frequent urination) *BOWEL PROBLEMS (unusual diarrhea, constipation, pain near the anus) TENDERNESS IN MOUTH AND THROAT WITH OR WITHOUT PRESENCE OF ULCERS (sore  throat, sores in mouth, or a toothache) UNUSUAL RASH, SWELLING OR PAIN  UNUSUAL VAGINAL DISCHARGE OR ITCHING   Items with * indicate a potential emergency and should be followed up as soon as possible or go to the Emergency Department if any problems should occur.  Please show the CHEMOTHERAPY ALERT CARD or IMMUNOTHERAPY ALERT CARD at check-in to  the Emergency Department and triage nurse.  Should you have questions after your visit or need to cancel or reschedule your appointment, please contact [facility] [phone]  and follow the prompts.  Office hours are 8:00 a.m. to 4:30 p.m. Monday - Friday. Please note that voicemails left after 4:00 p.m. may not be returned until the following business day.  We are closed weekends and major holidays. You have access to a nurse at all times for urgent questions. Please call the main number to the clinic [phone] and follow the prompts.  For any non-urgent questions, you may also contact your provider using MyChart. We now offer e-Visits for anyone 24 and older to request care online for non-urgent symptoms. For details visit [email or web address].   Also download the MyChart app! Go to the app store, search "MyChart", open the app, select [facility], and log in with your MyChart username and password.  Masks are optional in the cancer centers. If you would like for your care team to wear a mask while they are taking care of you, please let them know. You may have one support person who is at least 51 years old accompany you for your appointments.

## 2021-11-03 NOTE — Progress Notes (Signed)
Patient presents today for chemotherapy infusion.  Patient is in satisfactory condition with no complaints voiced. Vital signs are stable.  Labs reviewed by Dr. Delton Coombes during her office visit.  All labs are within treatment parameters.  We will proceed with treatment per MD orders.   Patient tolerated treatment well with no complaints voiced.  Home infusion 5FU pump was connected.  Patient left ambulatory in stable condition.  Vital signs stable at discharge.  Follow up as scheduled.

## 2021-11-03 NOTE — Progress Notes (Signed)
Confirmed dose of 5-Fluorouracil CIV to be 2400 mg/m2 for treatment and leucovorin at 200 mg/m2.   Dose in treatment plan updated to reflect change.  T.O. Dr Rhys Martini, PharmD

## 2021-11-03 NOTE — Progress Notes (Signed)
Patients port flushed without difficulty.  Good blood return noted with no bruising or swelling noted at site.  Patient remains accessed for chemotherapy treatment.

## 2021-11-03 NOTE — Progress Notes (Signed)
Diana Fox, [place] [id number]   CLINIC:  Medical Oncology/Hematology  PCP:  Caryl Bis, MD [address] 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  PRIOR THERAPY: Sigmoid colon resection, partial small bowel resection and Hartman's procedure on 03/26/2021  NGS Results: KRAS/NRAS negative, BRAF negative, HER2 negative, MSI-stable  CURRENT THERAPY: FOLFOXIRI + Panitumumab q14d  BRIEF ONCOLOGIC HISTORY:  Oncology History  Colon carcinoma ([facility])  03/26/2021 Initial Diagnosis   Colon carcinoma ([facility])   04/06/2021 Cancer Staging   Staging form: Colon and Rectum, AJCC 8th Edition - Clinical stage from 04/06/2021: Stage IVC (cT4a, cN2b, pM1c) - Signed by Derek Jack, MD on 05/04/2021 Histopathologic type: Adenocarcinoma, NOS Stage prefix: Initial diagnosis Total positive nodes: 9 Total nodes examined: 9 Tumor deposits (TD): Present Carcinoembryonic antigen (CEA) (ng/mL): 61.5 Perineural invasion (PNI): Present Microsatellite instability (MSI): Stable KRAS mutation: Negative NRAS mutation: Negative BRAF mutation: Negative   05/17/2021 - 10/08/2021 Chemotherapy   Patient is on Treatment Plan : COLORECTAL FOLFOXIRI + Panitumumab q14d     05/17/2021 -  Chemotherapy   Patient is on Treatment Plan : COLORECTAL FOLFOXIRI q14d       CANCER STAGING:  Cancer Staging  Colon carcinoma [facility]) Staging form: Colon and Rectum, AJCC 8th Edition - Clinical stage from 04/06/2021: Stage IVC (cT4a, cN2b, pM1c) - Signed by Derek Jack, MD on 05/04/2021   INTERVAL HISTORY:  Diana Fox, a 51 y.o. female, here for follow-up of metastatic colon cancer and toxicity assessment prior to next cycle of chemotherapy.  She reportedly had colonoscopy on 10/04/2021 which was normal.  She denies any tingling or numbness in the extremities.  Cycle 8 of  chemotherapy was on 10/06/2021.  She has met with Dr. Crisoforo [facility] at [facility].  REVIEW OF SYSTEMS:  Review of Systems  Constitutional:  Negative for appetite change and fatigue.  Neurological:  Negative for numbness.  All other systems reviewed and are negative.   PAST MEDICAL/SURGICAL HISTORY:  Past Medical History:  Diagnosis Date   Anemia    Back pain    Hypertension    Port-A-Cath in place 05/10/2021   Past Surgical History:  Procedure Laterality Date   BIOPSY  03/02/2021   Procedure: BIOPSY;  Surgeon: Eloise Harman, DO;  Location[facility];  Service: Endoscopy;;   BOWEL RESECTION N/A 03/26/2021   Procedure: SMALL BOWEL RESECTION;  Surgeon: Aviva Signs, MD;  Location: [facility] ORS;  Service: General;  Laterality: N/A;   CHOLECYSTECTOMY     COLONOSCOPY WITH PROPOFOL N/A 10/04/2021   Procedure: COLONOSCOPY WITH PROPOFOL;  Surgeon: Eloise Harman, DO;  Location: [facility];  Service: Endoscopy;  Laterality: N/A;  12:15PM, VIA OSTOMY   COLOSTOMY N/A 03/26/2021   Procedure: COLOSTOMY;  Surgeon: Aviva Signs, MD;  Location: [facility];  Service: General;  Laterality: N/A;   ESOPHAGOGASTRODUODENOSCOPY (EGD) WITH PROPOFOL N/A 03/02/2021   Procedure: ESOPHAGOGASTRODUODENOSCOPY (EGD) WITH PROPOFOL;  Surgeon: Eloise Harman, DO;  Location[facility];  Service: Endoscopy;  Laterality: N/A;   PARTIAL COLECTOMY N/A 03/26/2021   Procedure: PARTIAL COLECTOMY;  Surgeon: Aviva Signs, MD;  Location: [facility];  Service: General;  Laterality: N/A;   PORTACATH PLACEMENT Left 04/14/2021   Procedure: INSERTION PORT-A-CATH;  Surgeon: Aviva Signs, MD;  Location: [facility] OR[facility];  Service: General;  Laterality: Left;   SCLEROTHERAPY  03/02/2021   Procedure:  SCLEROTHERAPY;  Surgeon: Eloise Harman, DO;  Location[facility];  Service: Endoscopy;;   SIGMOIDOSCOPY  03/02/2021   Procedure: SIGMOIDOSCOPY;  Surgeon: Eloise Harman, DO;  [facility];  Service: Endoscopy;;    SOCIAL  HISTORY:  Social History   Socioeconomic History   Marital status: Married    Spouse name: Not on file   Number of children: Not on file   Years of education: Not on file   Highest education level: Not on file  Occupational History   Not on file  Tobacco Use   Smoking status: Never   Smokeless tobacco: Never  Vaping Use   Vaping Use: Never used  Substance and Sexual Activity   Alcohol use: No   Drug use: No   Sexual activity: Not on file  Other Topics Concern   Not on file  Social History Narrative   Not on file   Social Determinants of Health   Financial Resource Strain: High Risk (05/19/2021)   Overall Financial Resource Strain (CARDIA)    Difficulty of Paying Living Expenses: Hard  Food Insecurity: Not on file  Transportation Needs: Not on file  Physical Activity: Not on file  Stress: Not on file  Social Connections: Not on file  Intimate Partner Violence: Not on file    FAMILY HISTORY:  Family History  Problem Relation Age of Onset   Hypertension Mother    Hypertension Father    Colon cancer Neg Hx    Colon polyps Neg Hx    Inflammatory bowel disease Neg Hx     CURRENT MEDICATIONS:  Current Outpatient Medications  Medication Sig Dispense Refill   doxycycline (VIBRA-TABS) 100 MG tablet Take 1 tablet (100 mg total) by mouth 2 (two) times daily. 90 tablet 3   ferrous sulfate 325 (65 FE) MG tablet TAKE 325 MG BY MOUTH DAILY. (Patient taking differently: 325 mg daily with breakfast.) 90 tablet 1   fexofenadine (ALLEGRA) 180 MG tablet Take 1 tablet (180 mg total) by mouth daily. 30 tablet 0   fluorouracil CALGB 36468 2,400 mg/m2 in sodium chloride 0.9 % 150 mL Inject 2,400 mg/m2 into the vein over 48 hr. Every 14 days     FLUOROURACIL IV Inject into the vein every 14 (fourteen) days.     fluticasone (FLONASE) 50 MCG/ACT nasal spray Place 2 sprays into both nostrils daily. 16 g 12   hydrocortisone 1 % lotion Apply 1 application. topically 2 (two) times daily. 118  mL 5   lidocaine (XYLOCAINE) 2 % solution Use as directed 10 mLs in the mouth or throat 4 (four) times daily as needed for mouth pain (Mix 1:1 with carafate and swish and spit). 100 mL ML   magnesium oxide (MAG-OX) 400 (240 Mg) MG tablet Take 1 tablet (400 mg total) by mouth 2 (two) times daily. 60 tablet 6   medroxyPROGESTERone (DEPO-PROVERA) 150 MG/ML injection Inject 150 mg into the muscle every 3 (three) months.     megestrol (MEGACE) 400 MG/10ML suspension Take 10 mLs (400 mg total) by mouth 2 (two) times daily. 240 mL 2   Multiple Vitamin (MULTIVITAMIN) tablet Take 1 tablet by mouth daily.     nebivolol (BYSTOLIC) 10 MG tablet Take 1 tablet (10 mg total) by mouth daily. 30 tablet 1   OXALIPLATIN IV Inject into the vein every 14 (fourteen) days.     Panitumumab (VECTIBIX IV) Inject into the vein every 14 (fourteen) days.     pantoprazole (PROTONIX) 40 MG  tablet Take 40 mg by mouth daily as needed (acid reflux).     potassium chloride SA (KLOR-CON M20) 20 MEQ tablet Take 1 tablet (20 mEq total) by mouth in the morning, at noon, in the evening, and at bedtime. 120 tablet 3   sucralfate (CARAFATE) 1 GM/10ML suspension Take 10 mLs (1 g total) by mouth 4 (four) times daily as needed. Mix 1:1 with 2% lidocaine viscous solution and swish and spit 420 mL 6   No current facility-administered medications for this visit.    ALLERGIES:  Allergies  Allergen Reactions   Zithromax [Azithromycin] Hives    infusing arm began to swell , her face got red, and she broke out, there are bumps around where the IV site was infusing   Motrin [Ibuprofen] Itching   Sudafed [Pseudoephedrine Hcl] Other (See Comments)    Hypertension   Zestril [Lisinopril] Cough    PHYSICAL EXAM:  Performance status (ECOG): 1 - Symptomatic but completely ambulatory  There were no vitals filed for this visit.  Wt Readings from Last 3 Encounters:  10/06/21 136 lb 1.6 oz (61.7 kg)  10/04/21 137 lb (62.1 kg)  09/22/21 139 lb  9.6 oz (63.3 kg)   Physical Exam Vitals reviewed.  Constitutional:      Appearance: Normal appearance.  Cardiovascular:     Rate and Rhythm: Normal rate and regular rhythm.     Pulses: Normal pulses.     Heart sounds: Normal heart sounds.  Pulmonary:     Effort: Pulmonary effort is normal.     Breath sounds: Normal breath sounds.  Neurological:     General: No focal deficit present.     Mental Status: She is alert and oriented to person, place, and time.  Psychiatric:        Mood and Affect: Mood normal.        Behavior: Behavior normal.     LABORATORY DATA:  I have reviewed the labs as listed.     Latest Ref Rng & Units 10/06/2021    8:25 AM 09/22/2021    8:27 AM 08/23/2021    8:41 AM  CBC  WBC 4.0 - 10.5 K/uL 6.6  6.5  5.2   Hemoglobin 12.0 - 15.0 g/dL 11.4  11.0  10.9   Hematocrit 36.0 - 46.0 % 33.6  32.9  32.6   Platelets 150 - 400 K/uL 174  285  259       Latest Ref Rng & Units 10/06/2021    8:25 AM 09/22/2021    8:27 AM 08/23/2021    8:41 AM  CMP  Glucose 70 - 99 mg/dL 91  94  102   BUN 6 - 20 mg/dL _0 Creatinine 0.44 - 1.00 mg/dL 0.59  0.57  0.60   Sodium 135 - 145 mmol/L 139  140  141   Potassium 3.5 - 5.1 mmol/L 2.9  3.8  3.1   Chloride 98 - 111 mmol/L 110  114  110   CO2 22 - 32 mmol/L _1 Calcium 8.9 - 10.3 mg/dL 8.5  8.5  8.7   Total Protein 6.5 - 8.1 g/dL 6.9  6.3  6.6   Total Bilirubin 0.3 - 1.2 mg/dL 0.3  0.4  0.6   Alkaline Phos 38 - 126 U/L 77  47  51   AST 15 - 41 U/L _2 ALT 0 - 44 U/L 20  19  31     DIAGNOSTIC IMAGING:  I have independently reviewed the scans and discussed with the patient. No results found.   ASSESSMENT:  PT4PN2B N1C stage IV sigmoid colon cancer, NRAS negative: - Colonoscopy on 03/02/2021: Nonbleeding internal hemorrhoids, severe stenosis in the sigmoid colon at approximately 18 cm from anal verge, unable to be traversed with pediatric colonoscopy.  Biopsy consistent with moderately to poorly  differentiated adenocarcinoma. - CTAP with contrast on 01/10/2021: Diffusely thickened, 10 cm segment of distal sigmoid colon with adjacent moderate inflammatory reaction, suspected 2 sites of small bowel fistulization to the diseased segment and a rim-enhancing low-density 2.5 x 1.8 cm stricture medial to the mid segment which could represent a necrotic lymph node or contained perforation/abscess.  There are bilateral mildly enlarged common iliac chain nodes.  Mildly dilated mid to lower abdominal small bowel segments.  Mildly prominent slightly steatotic liver. - Sigmoid colon resection, partial small bowel resection and Hartman's procedure on 03/26/2021: Moderately to poorly differentiated adenocarcinoma involving the subserosal adipose tissue and present at the margin.  9/9 lymph nodes positive for metastatic carcinoma.  Small bowel resection is positive for adenocarcinoma nodules in the serosal fat.  PT4PN2B.  MMR is preserved. - Operative report mentions obvious extension of the tumor outside the colon to the pelvic brim, unable to fully excise all of the tumor in the pelvis. - NGS testing: KRAS/NRAS negative, BRAF negative, HER2 negative, MSI-stable - PET scan on 04/29/2021: Multifocal hypermetabolic nodule, peritoneal and anterior abdominal wall adenopathy.  Findings worrisome for peritoneal carcinomatosis.  No evidence of hepatic metastatic disease or distant mets. - FOLFOXIRI and Vectibix started on 05/17/2021 - We will see how she responds and will refer to Dr. Crisoforo Oxford for resection/HIPEC therapy.    Social/family history: - She lives at home with her family.  She works as a Quarry manager at [employer].  Non-smoker. - Paternal cousin had cancer.  Maternal aunt had cancer.   PLAN:  Stage IV sigmoid colon adenocarcinoma, MSI-stable, RAS/BRAF negative: - She met with Dr. Crisoforo Oxford at [facility] for possible HIPEC therapy.  She is thinking about it and has some questions.  I have asked her to  reach out to Dr. Lyda Jester office and talk to him.  Colonoscopy on 10/04/2021 was negative. - Reviewed her labs today which showed normal LFTs.  CBC was grossly normal.  She will proceed with next cycle of chemotherapy today.  RTC 2 weeks for follow-up.  We will plan to arrange CT CAP with contrast.  We will also check CEA level.  2.  Hypokalemia: - Can continue potassium 20 mEq 4 times daily.  Potassium today is 3.9.  3.  Weight loss: - She is continuing to improve weight.  She is no longer requiring nutritional supplements.  4.  Microcytic anemia: - Continue iron tablet daily.  Hemoglobin today is 11.6.  5.  Acneform rash: - She does not have any acneform rash.  If she does develop it, she will take doxycycline.   Orders placed this encounter:  No orders of the defined types were placed in this encounter.    Derek Jack, MD [place] [phone]

## 2021-11-05 ENCOUNTER — Inpatient Hospital Stay: Payer: Medicaid Other

## 2021-11-05 VITALS — BP 126/85 | HR 76 | Temp 97.9°F | Resp 18

## 2021-11-05 DIAGNOSIS — C189 Malignant neoplasm of colon, unspecified: Secondary | ICD-10-CM | POA: Diagnosis not present

## 2021-11-05 DIAGNOSIS — Z95828 Presence of other vascular implants and grafts: Secondary | ICD-10-CM

## 2021-11-05 LAB — CEA: CEA: 14.1 ng/mL — ABNORMAL HIGH (ref 0.0–4.7)

## 2021-11-05 MED ORDER — PEGFILGRASTIM-CBQV 6 MG/0.6ML ~~LOC~~ SOSY
6.0000 mg | PREFILLED_SYRINGE | Freq: Once | SUBCUTANEOUS | Status: AC
  Administered 2021-11-05: 6 mg via SUBCUTANEOUS
  Filled 2021-11-05: qty 0.6

## 2021-11-05 MED ORDER — SODIUM CHLORIDE 0.9% FLUSH
10.0000 mL | INTRAVENOUS | Status: DC | PRN
  Administered 2021-11-05: 10 mL

## 2021-11-05 MED ORDER — HEPARIN SOD (PORK) LOCK FLUSH 100 UNIT/ML IV SOLN
500.0000 [IU] | Freq: Once | INTRAVENOUS | Status: AC | PRN
  Administered 2021-11-05: 500 [IU]

## 2021-11-05 NOTE — Patient Instructions (Signed)
Diana Fox  Discharge Instructions: Thank you for choosing [facility] to provide your oncology and hematology care.  If you have a lab appointment with the [facility], please come in thru the Main Entrance and check in at the main information desk.  Wear comfortable clothing and clothing appropriate for easy access to any Portacath or PICC line.   We strive to give you quality time with your provider. You may need to reschedule your appointment if you arrive late (15 or more minutes).  Arriving late affects you and other patients whose appointments are after yours.  Also, if you miss three or more appointments without notifying the office, you may be dismissed from the clinic at the provider's discretion.      For prescription refill requests, have your pharmacy contact our office and allow 72 hours for refills to be completed.    Today you received the following chemotherapy and/or immunotherapy agents 5FU. Udenyca injection.      To help prevent nausea and vomiting after your treatment, we encourage you to take your nausea medication as directed.  BELOW ARE SYMPTOMS THAT SHOULD BE REPORTED IMMEDIATELY: *FEVER GREATER THAN 100.4 F (38 C) OR HIGHER *CHILLS OR SWEATING *NAUSEA AND VOMITING THAT IS NOT CONTROLLED WITH YOUR NAUSEA MEDICATION *UNUSUAL SHORTNESS OF BREATH *UNUSUAL BRUISING OR BLEEDING *URINARY PROBLEMS (pain or burning when urinating, or frequent urination) *BOWEL PROBLEMS (unusual diarrhea, constipation, pain near the anus) TENDERNESS IN MOUTH AND THROAT WITH OR WITHOUT PRESENCE OF ULCERS (sore throat, sores in mouth, or a toothache) UNUSUAL RASH, SWELLING OR PAIN  UNUSUAL VAGINAL DISCHARGE OR ITCHING   Items with * indicate a potential emergency and should be followed up as soon as possible or go to the Emergency Department if any problems should occur.  Please show the CHEMOTHERAPY ALERT CARD or IMMUNOTHERAPY ALERT CARD at check-in  to the Emergency Department and triage nurse.  Should you have questions after your visit or need to cancel or reschedule your appointment, please contact [facility] [phone]  and follow the prompts.  Office hours are 8:00 a.m. to 4:30 p.m. Monday - Friday. Please note that voicemails left after 4:00 p.m. may not be returned until the following business day.  We are closed weekends and major holidays. You have access to a nurse at all times for urgent questions. Please call the main number to the clinic [phone] and follow the prompts.  For any non-urgent questions, you may also contact your provider using MyChart. We now offer e-Visits for anyone 37 and older to request care online for non-urgent symptoms. For details visit [email or web address].   Also download the MyChart app! Go to the app store, search "MyChart", open the app, select [facility], and log in with your MyChart username and password.  Masks are optional in the cancer centers. If you would like for your care team to wear a mask while they are taking care of you, please let them know. You may have one support person who is at least 51 years old accompany you for your appointments.

## 2021-11-05 NOTE — Progress Notes (Signed)
Patient presents today for pump d/c. Vital signs are stable. Port a cath site clean, dry, and intact. Port flushed with 10 mls of Normal Saline and 500 Units of Heparin. Needle removed intact. Band aid applied. Patient has no complaints at this time. Discharged from clinic ambulatory and in stable condition. Patient alert and oriented.

## 2021-11-15 ENCOUNTER — Ambulatory Visit (HOSPITAL_COMMUNITY)
Admission: RE | Admit: 2021-11-15 | Discharge: 2021-11-15 | Disposition: A | Discharge status: Home / Self Care | Payer: Medicaid Other | Source: Ambulatory Visit | Attending: Hematology | Admitting: Hematology

## 2021-11-15 DIAGNOSIS — C189 Malignant neoplasm of colon, unspecified: Secondary | ICD-10-CM | POA: Diagnosis present

## 2021-11-15 MED ORDER — IOHEXOL 300 MG/ML  SOLN
100.0000 mL | Freq: Once | INTRAMUSCULAR | Status: AC | PRN
  Administered 2021-11-15: 80 mL via INTRAVENOUS

## 2021-11-17 ENCOUNTER — Inpatient Hospital Stay: Payer: Medicaid Other

## 2021-11-17 ENCOUNTER — Encounter: Payer: Self-pay | Admitting: Hematology

## 2021-11-17 ENCOUNTER — Inpatient Hospital Stay: Payer: Medicaid Other | Attending: Hematology

## 2021-11-17 ENCOUNTER — Inpatient Hospital Stay (HOSPITAL_BASED_OUTPATIENT_CLINIC_OR_DEPARTMENT_OTHER): Payer: Medicaid Other | Admitting: Hematology

## 2021-11-17 VITALS — BP 144/73 | HR 71 | Temp 98.1°F | Resp 18

## 2021-11-17 VITALS — BP 149/93 | HR 77 | Temp 98.1°F | Resp 17 | Ht 65.0 in | Wt 140.2 lb

## 2021-11-17 DIAGNOSIS — C189 Malignant neoplasm of colon, unspecified: Secondary | ICD-10-CM | POA: Diagnosis not present

## 2021-11-17 DIAGNOSIS — C187 Malignant neoplasm of sigmoid colon: Secondary | ICD-10-CM | POA: Insufficient documentation

## 2021-11-17 DIAGNOSIS — D649 Anemia, unspecified: Secondary | ICD-10-CM | POA: Diagnosis not present

## 2021-11-17 DIAGNOSIS — Z793 Long term (current) use of hormonal contraceptives: Secondary | ICD-10-CM | POA: Diagnosis not present

## 2021-11-17 DIAGNOSIS — G629 Polyneuropathy, unspecified: Secondary | ICD-10-CM | POA: Diagnosis not present

## 2021-11-17 DIAGNOSIS — Z95828 Presence of other vascular implants and grafts: Secondary | ICD-10-CM

## 2021-11-17 DIAGNOSIS — C786 Secondary malignant neoplasm of retroperitoneum and peritoneum: Secondary | ICD-10-CM | POA: Diagnosis not present

## 2021-11-17 DIAGNOSIS — Z5189 Encounter for other specified aftercare: Secondary | ICD-10-CM | POA: Insufficient documentation

## 2021-11-17 DIAGNOSIS — I10 Essential (primary) hypertension: Secondary | ICD-10-CM | POA: Insufficient documentation

## 2021-11-17 DIAGNOSIS — R200 Anesthesia of skin: Secondary | ICD-10-CM | POA: Insufficient documentation

## 2021-11-17 DIAGNOSIS — Z800 Family history of malignant neoplasm of digestive organs: Secondary | ICD-10-CM | POA: Insufficient documentation

## 2021-11-17 DIAGNOSIS — R21 Rash and other nonspecific skin eruption: Secondary | ICD-10-CM | POA: Diagnosis not present

## 2021-11-17 DIAGNOSIS — Z79899 Other long term (current) drug therapy: Secondary | ICD-10-CM | POA: Diagnosis not present

## 2021-11-17 DIAGNOSIS — E876 Hypokalemia: Secondary | ICD-10-CM | POA: Diagnosis not present

## 2021-11-17 DIAGNOSIS — R251 Tremor, unspecified: Secondary | ICD-10-CM | POA: Diagnosis not present

## 2021-11-17 LAB — MAGNESIUM: Magnesium: 2 mg/dL (ref 1.7–2.4)

## 2021-11-17 LAB — CBC WITH DIFFERENTIAL/PLATELET
Abs Immature Granulocytes: 0.04 10*3/uL (ref 0.00–0.07)
Basophils Absolute: 0 10*3/uL (ref 0.0–0.1)
Basophils Relative: 1 %
Eosinophils Absolute: 0.2 10*3/uL (ref 0.0–0.5)
Eosinophils Relative: 3 %
HCT: 35.5 % — ABNORMAL LOW (ref 36.0–46.0)
Hemoglobin: 12 g/dL (ref 12.0–15.0)
Immature Granulocytes: 1 %
Lymphocytes Relative: 39 %
Lymphs Abs: 2.1 10*3/uL (ref 0.7–4.0)
MCH: 31.3 pg (ref 26.0–34.0)
MCHC: 33.8 g/dL (ref 30.0–36.0)
MCV: 92.7 fL (ref 80.0–100.0)
Monocytes Absolute: 0.6 10*3/uL (ref 0.1–1.0)
Monocytes Relative: 11 %
Neutro Abs: 2.5 10*3/uL (ref 1.7–7.7)
Neutrophils Relative %: 45 %
Platelets: 159 10*3/uL (ref 150–400)
RBC: 3.83 MIL/uL — ABNORMAL LOW (ref 3.87–5.11)
RDW: 15.9 % — ABNORMAL HIGH (ref 11.5–15.5)
WBC: 5.3 10*3/uL (ref 4.0–10.5)
nRBC: 0 % (ref 0.0–0.2)

## 2021-11-17 LAB — COMPREHENSIVE METABOLIC PANEL
ALT: 19 U/L (ref 0–44)
AST: 24 U/L (ref 15–41)
Albumin: 3.7 g/dL (ref 3.5–5.0)
Alkaline Phosphatase: 82 U/L (ref 38–126)
Anion gap: 8 (ref 5–15)
BUN: 6 mg/dL (ref 6–20)
CO2: 22 mmol/L (ref 22–32)
Calcium: 9 mg/dL (ref 8.9–10.3)
Chloride: 110 mmol/L (ref 98–111)
Creatinine, Ser: 0.56 mg/dL (ref 0.44–1.00)
GFR, Estimated: >60 mL/min (ref >60)
Glucose, Bld: 94 mg/dL (ref 70–99)
Potassium: 3.6 mmol/L (ref 3.5–5.1)
Sodium: 140 mmol/L (ref 135–145)
Total Bilirubin: 0.2 mg/dL — ABNORMAL LOW (ref 0.3–1.2)
Total Protein: 7.3 g/dL (ref 6.5–8.1)

## 2021-11-17 MED ORDER — ATROPINE SULFATE 1 MG/ML IV SOLN
0.5000 mg | Freq: Once | INTRAVENOUS | Status: AC | PRN
  Administered 2021-11-17: 0.5 mg via INTRAVENOUS
  Filled 2021-11-17: qty 1

## 2021-11-17 MED ORDER — SODIUM CHLORIDE 0.9 % IV SOLN
10.0000 mg | Freq: Once | INTRAVENOUS | Status: AC
  Administered 2021-11-17: 10 mg via INTRAVENOUS
  Filled 2021-11-17: qty 10

## 2021-11-17 MED ORDER — LEUCOVORIN CALCIUM INJECTION 350 MG
200.0000 mg/m2 | Freq: Once | INTRAVENOUS | Status: AC
  Administered 2021-11-17: 332 mg via INTRAVENOUS
  Filled 2021-11-17: qty 16.6

## 2021-11-17 MED ORDER — SODIUM CHLORIDE 0.9 % IV SOLN
2400.0000 mg/m2 | INTRAVENOUS | Status: DC
  Administered 2021-11-17: 4000 mg via INTRAVENOUS
  Filled 2021-11-17: qty 80

## 2021-11-17 MED ORDER — SODIUM CHLORIDE 0.9 % IV SOLN
165.0000 mg/m2 | Freq: Once | INTRAVENOUS | Status: AC
  Administered 2021-11-17: 280 mg via INTRAVENOUS
  Filled 2021-11-17: qty 10

## 2021-11-17 MED ORDER — DEXTROSE 5 % IV SOLN: Freq: Once | INTRAVENOUS | Status: AC

## 2021-11-17 MED ORDER — SODIUM CHLORIDE 0.9 % IV SOLN
6.0000 mg/kg | Freq: Once | INTRAVENOUS | Status: AC
  Administered 2021-11-17: 400 mg via INTRAVENOUS
  Filled 2021-11-17: qty 20

## 2021-11-17 MED ORDER — PALONOSETRON HCL INJECTION 0.25 MG/5ML
0.2500 mg | Freq: Once | INTRAVENOUS | Status: AC
  Administered 2021-11-17: 0.25 mg via INTRAVENOUS
  Filled 2021-11-17: qty 5

## 2021-11-17 MED ORDER — SODIUM CHLORIDE 0.9 % IV SOLN
150.0000 mg | Freq: Once | INTRAVENOUS | Status: AC
  Administered 2021-11-17: 150 mg via INTRAVENOUS
  Filled 2021-11-17: qty 150

## 2021-11-17 MED ORDER — OXALIPLATIN CHEMO INJECTION 100 MG/20ML
61.0000 mg/m2 | Freq: Once | INTRAVENOUS | Status: AC
  Administered 2021-11-17: 100 mg via INTRAVENOUS
  Filled 2021-11-17: qty 20

## 2021-11-17 MED ORDER — SODIUM CHLORIDE 0.9% FLUSH
10.0000 mL | INTRAVENOUS | Status: DC | PRN
  Administered 2021-11-17: 10 mL

## 2021-11-17 NOTE — Progress Notes (Signed)
[name], [name] [address]   CLINIC:  Medical Oncology/Hematology  PCP:  Caryl Bis, MD [address] 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  PRIOR THERAPY: Sigmoid colon resection, partial small bowel resection and Hartman's procedure on 03/26/2021  NGS Results: KRAS/NRAS negative, BRAF negative, HER2 negative, MSI-stable  CURRENT THERAPY: FOLFOXIRI + Panitumumab q14d  BRIEF ONCOLOGIC HISTORY:  Oncology History  Colon carcinoma ([place])  03/26/2021 Initial Diagnosis   Colon carcinoma ([place])   04/06/2021 Cancer Staging   Staging form: Colon and Rectum, AJCC 8th Edition - Clinical stage from 04/06/2021: Stage IVC (cT4a, cN2b, pM1c) - Signed by Derek Jack, MD on 05/04/2021 Histopathologic type: Adenocarcinoma, NOS Stage prefix: Initial diagnosis Total positive nodes: 9 Total nodes examined: 9 Tumor deposits (TD): Present Carcinoembryonic antigen (CEA) (ng/mL): 61.5 Perineural invasion (PNI): Present Microsatellite instability (MSI): Stable KRAS mutation: Negative NRAS mutation: Negative BRAF mutation: Negative   05/17/2021 - 10/08/2021 Chemotherapy   Patient is on Treatment Plan : COLORECTAL FOLFOXIRI + Panitumumab q14d     05/17/2021 -  Chemotherapy   Patient is on Treatment Plan : COLORECTAL FOLFOXIRI q14d       CANCER STAGING:  Cancer Staging  Colon carcinoma [facility]) Staging form: Colon and Rectum, AJCC 8th Edition - Clinical stage from 04/06/2021: Stage IVC (cT4a, cN2b, pM1c) - Signed by Derek Jack, MD on 05/04/2021   INTERVAL HISTORY:  [name], a 51 y.o. female, here for follow-up of metastatic colon cancer and toxicity assessment prior to next cycle of chemotherapy.  Cycle 9 of FOLFOXIRI and Vectibix was given on 11/03/2021.  She reported left hand fingertips numbness on and off which goes away after  shaking.  This started after last cycle.  Denied any nausea vomiting or diarrhea.  REVIEW OF SYSTEMS:  Review of Systems  Constitutional:  Negative for appetite change and fatigue.  Neurological:  Positive for numbness (Left hand fingertips on and off numbness).  All other systems reviewed and are negative.   PAST MEDICAL/SURGICAL HISTORY:  Past Medical History:  Diagnosis Date   Anemia    Back pain    Hypertension    Port-A-Cath in place 05/10/2021   Past Surgical History:  Procedure Laterality Date   BIOPSY  03/02/2021   Procedure: BIOPSY;  Surgeon: Eloise Harman, DO;  [facility];  Service: Endoscopy;;   BOWEL RESECTION N/A 03/26/2021   Procedure: SMALL BOWEL RESECTION;  Surgeon: Aviva Signs, MD;  Location: [facility];  Service: General;  Laterality: N/A;   CHOLECYSTECTOMY     COLONOSCOPY WITH PROPOFOL N/A 10/04/2021   Procedure: COLONOSCOPY WITH PROPOFOL;  Surgeon: Eloise Harman, DO;  Location[facility];  Service: Endoscopy;  Laterality: N/A;  12:15PM, VIA OSTOMY   COLOSTOMY N/A 03/26/2021   Procedure: COLOSTOMY;  Surgeon: Aviva Signs, MD;  Location: [facility];  Service: General;  Laterality: N/A;   ESOPHAGOGASTRODUODENOSCOPY (EGD) WITH PROPOFOL N/A 03/02/2021   Procedure: ESOPHAGOGASTRODUODENOSCOPY (EGD) WITH PROPOFOL;  Surgeon: Eloise Harman, DO;  [facility];  Service: Endoscopy;  Laterality: N/A;   PARTIAL COLECTOMY N/A 03/26/2021   Procedure: PARTIAL COLECTOMY;  Surgeon: Aviva Signs, MD;  Location: [facility];  Service: General;  Laterality: N/A;   PORTACATH PLACEMENT Left 04/14/2021   Procedure: INSERTION PORT-A-CATH;  Surgeon: Aviva Signs, MD;  Location: [facility];  Service: General;  Laterality: Left;  SCLEROTHERAPY  03/02/2021   Procedure: SCLEROTHERAPY;  Surgeon: Eloise Harman, DO;  [facility];  Service: Endoscopy;;   SIGMOIDOSCOPY  03/02/2021   Procedure: SIGMOIDOSCOPY;  Surgeon: Eloise Harman, DO;  [facility];  Service: Endoscopy;;    SOCIAL HISTORY:  Social History   Socioeconomic History   Marital status: Married    Spouse name: Not on file   Number of children: Not on file   Years of education: Not on file   Highest education level: Not on file  Occupational History   Not on file  Tobacco Use   Smoking status: Never   Smokeless tobacco: Never  Vaping Use   Vaping Use: Never used  Substance and Sexual Activity   Alcohol use: No   Drug use: No   Sexual activity: Not on file  Other Topics Concern   Not on file  Social History Narrative   Not on file   Social Determinants of Health   Financial Resource Strain: High Risk (05/19/2021)   Overall Financial Resource Strain (CARDIA)    Difficulty of Paying Living Expenses: Hard  Food Insecurity: Not on file  Transportation Needs: Not on file  Physical Activity: Not on file  Stress: Not on file  Social Connections: Not on file  Intimate Partner Violence: Not on file    FAMILY HISTORY:  Family History  Problem Relation Age of Onset   Hypertension Mother    Hypertension Father    Colon cancer Neg Hx    Colon polyps Neg Hx    Inflammatory bowel disease Neg Hx     CURRENT MEDICATIONS:  Current Outpatient Medications  Medication Sig Dispense Refill   doxycycline (VIBRA-TABS) 100 MG tablet Take 1 tablet (100 mg total) by mouth 2 (two) times daily. 90 tablet 3   ferrous sulfate 325 (65 FE) MG tablet TAKE 325 MG BY MOUTH DAILY. (Patient taking differently: 325 mg daily with breakfast.) 90 tablet 1   fexofenadine (ALLEGRA) 180 MG tablet Take 1 tablet (180 mg total) by mouth daily. 30 tablet 0   fluorouracil CALGB 80998 2,400 mg/m2 in sodium chloride 0.9 % 150 mL Inject 2,400 mg/m2 into the vein over 48 hr. Every 14 days     FLUOROURACIL IV Inject into the vein every 14 (fourteen) days.     fluticasone (FLONASE) 50 MCG/ACT nasal spray Place 2 sprays into both nostrils daily. 16 g 12   hydrocortisone 1 % lotion Apply 1  application. topically 2 (two) times daily. 118 mL 5   lidocaine (XYLOCAINE) 2 % solution Use as directed 10 mLs in the mouth or throat 4 (four) times daily as needed for mouth pain (Mix 1:1 with carafate and swish and spit). 100 mL ML   magnesium oxide (MAG-OX) 400 (240 Mg) MG tablet Take 1 tablet (400 mg total) by mouth 2 (two) times daily. 60 tablet 6   medroxyPROGESTERone (DEPO-PROVERA) 150 MG/ML injection Inject 150 mg into the muscle every 3 (three) months.     megestrol (MEGACE) 400 MG/10ML suspension Take 10 mLs (400 mg total) by mouth 2 (two) times daily. 240 mL 2   metroNIDAZOLE (METROGEL) 0.75 % vaginal gel Place vaginally at bedtime.     Multiple Vitamin (MULTIVITAMIN) tablet Take 1 tablet by mouth daily.     nebivolol (BYSTOLIC) 10 MG tablet Take 1 tablet (10 mg total) by mouth daily. 30 tablet 1   OXALIPLATIN IV Inject into the vein every 14 (fourteen) days.  Panitumumab (VECTIBIX IV) Inject into the vein every 14 (fourteen) days.     pantoprazole (PROTONIX) 40 MG tablet Take 40 mg by mouth daily as needed (acid reflux).     potassium chloride SA (KLOR-CON M20) 20 MEQ tablet Take 1 tablet (20 mEq total) by mouth in the morning, at noon, in the evening, and at bedtime. 120 tablet 3   sucralfate (CARAFATE) 1 GM/10ML suspension Take 10 mLs (1 g total) by mouth 4 (four) times daily as needed. Mix 1:1 with 2% lidocaine viscous solution and swish and spit 420 mL 6   No current facility-administered medications for this visit.    ALLERGIES:  Allergies  Allergen Reactions   Zithromax [Azithromycin] Hives    infusing arm began to swell , her face got red, and she broke out, there are bumps around where the IV site was infusing   Motrin [Ibuprofen] Itching   Sudafed [Pseudoephedrine Hcl] Other (See Comments)    Hypertension   Zestril [Lisinopril] Cough    PHYSICAL EXAM:  Performance status (ECOG): 1 - Symptomatic but completely ambulatory  There were no vitals filed for this  visit.  Wt Readings from Last 3 Encounters:  11/03/21 139 lb 12.8 oz (63.4 kg)  10/06/21 136 lb 1.6 oz (61.7 kg)  10/04/21 137 lb (62.1 kg)   Physical Exam Vitals reviewed.  Constitutional:      Appearance: Normal appearance.  Cardiovascular:     Rate and Rhythm: Normal rate and regular rhythm.     Pulses: Normal pulses.     Heart sounds: Normal heart sounds.  Pulmonary:     Effort: Pulmonary effort is normal.     Breath sounds: Normal breath sounds.  Neurological:     General: No focal deficit present.     Mental Status: She is alert and oriented to person, place, and time.  Psychiatric:        Mood and Affect: Mood normal.        Behavior: Behavior normal.     LABORATORY DATA:  I have reviewed the labs as listed.     Latest Ref Rng & Units 11/03/2021    8:43 AM 10/06/2021    8:25 AM 09/22/2021    8:27 AM  CBC  WBC 4.0 - 10.5 K/uL 5.0  6.6  6.5   Hemoglobin 12.0 - 15.0 g/dL 11.6  11.4  11.0   Hematocrit 36.0 - 46.0 % 34.7  33.6  32.9   Platelets 150 - 400 K/uL 271  174  285       Latest Ref Rng & Units 11/03/2021    8:43 AM 10/06/2021    8:25 AM 09/22/2021    8:27 AM  CMP  Glucose 70 - 99 mg/dL 88  91  94   BUN 6 - 20 mg/dL 8  7  7    Creatinine 0.44 - 1.00 mg/dL 0.65  0.59  0.57   Sodium 135 - 145 mmol/L 138  139  140   Potassium 3.5 - 5.1 mmol/L 3.9  2.9  3.8   Chloride 98 - 111 mmol/L 111  110  114   CO2 22 - 32 mmol/L 22  21  23    Calcium 8.9 - 10.3 mg/dL 8.8  8.5  8.5   Total Protein 6.5 - 8.1 g/dL 7.0  6.9  6.3   Total Bilirubin 0.3 - 1.2 mg/dL 0.7  0.3  0.4   Alkaline Phos 38 - 126 U/L 57  77  47   AST  15 - 41 U/L 25  19  19    ALT 0 - 44 U/L 22  20  19      DIAGNOSTIC IMAGING:  I have independently reviewed the scans and discussed with the patient. CT CHEST ABDOMEN PELVIS W CONTRAST  Result Date: 11/16/2021 CLINICAL DATA:  Stage IV sigmoid colon adenocarcinoma. Resection and currently on chemotherapy. * Tracking Code: BO * EXAM: CT CHEST, ABDOMEN, AND  PELVIS WITH CONTRAST TECHNIQUE: Multidetector CT imaging of the chest, abdomen and pelvis was performed following the standard protocol during bolus administration of intravenous contrast. RADIATION DOSE REDUCTION: This exam was performed according to the departmental dose-optimization program which includes automated exposure control, adjustment of the mA and/or kV according to patient size and/or use of iterative reconstruction technique. CONTRAST:  40m OMNIPAQUE IOHEXOL 300 MG/ML  SOLN COMPARISON:  08/16/2021 CTs.  PET 04/29/2021 FINDINGS: CT CHEST FINDINGS Cardiovascular: Left Port-A-Cath tip low SVC. Normal heart size, without pericardial effusion. No central pulmonary embolism, on this non-dedicated study. Ultrasound. Mediastinum/Nodes: No mediastinal or hilar adenopathy. Lungs/Pleura: No pleural fluid. Subpleural 3 mm right middle lobe pulmonary nodule on 81/3 is similar to on the prior and likely a subpleural lymph node. Musculoskeletal: No acute osseous abnormality. CT ABDOMEN PELVIS FINDINGS Hepatobiliary: Normal liver. Cholecystectomy, without biliary ductal dilatation. Pancreas: Normal, without mass or ductal dilatation. Spleen: Normal in size, without focal abnormality. Adrenals/Urinary Tract: Normal adrenal glands. Normal kidneys, without hydronephrosis. Normal urinary bladder. Air within the nondependent bladder including on 102/2. Stomach/Bowel: Normal stomach, without wall thickening. Hartmann's pouch. Prior enterotomy within the ileum. Vascular/Lymphatic: Aortic atherosclerosis. No abdominal adenopathy. 7 mm soft tissue density within the root of the sigmoid mesocolon on 73/2 is decreased from a 9 mm on the prior. No pelvic sidewall adenopathy. Reproductive: Fibroid uterus. Endometrium of 1.8 cm on sagittal image 98, similar. No adnexal mass. Other: No significant free fluid. No evidence of omental or peritoneal disease. Trace fluid adjacent the right ovary medially, felt to be decreased on 89/2.  Musculoskeletal: No acute osseous abnormality. IMPRESSION: 1. Status post low anterior resection and Hartmann's pouch. 2. Further decrease in size of soft tissue density at the root of the sigmoid mesocolon. No evidence of new/progressive disease. 3.  No acute process or evidence of metastatic disease in the chest. 4. Air in the urinary bladder.  Correlate with instrumentation. 5. Fibroid uterus. Similar endometrial thickening of 1.8 cm. Especially if the patient is postmenopausal, consider further evaluation with pelvic ultrasound. Electronically Signed   By: KAbigail MiyamotoM.D.   On: 11/16/2021 15:48     ASSESSMENT:  PT4PN2B N1C stage IV sigmoid colon cancer, NRAS negative: - Colonoscopy on 03/02/2021: Nonbleeding internal hemorrhoids, severe stenosis in the sigmoid colon at approximately 18 cm from anal verge, unable to be traversed with pediatric colonoscopy.  Biopsy consistent with moderately to poorly differentiated adenocarcinoma. - CTAP with contrast on 01/10/2021: Diffusely thickened, 10 cm segment of distal sigmoid colon with adjacent moderate inflammatory reaction, suspected 2 sites of small bowel fistulization to the diseased segment and a rim-enhancing low-density 2.5 x 1.8 cm stricture medial to the mid segment which could represent a necrotic lymph node or contained perforation/abscess.  There are bilateral mildly enlarged common iliac chain nodes.  Mildly dilated mid to lower abdominal small bowel segments.  Mildly prominent slightly steatotic liver. - Sigmoid colon resection, partial small bowel resection and Hartman's procedure on 03/26/2021: Moderately to poorly differentiated adenocarcinoma involving the subserosal adipose tissue and present at the margin.  9/9 lymph  nodes positive for metastatic carcinoma.  Small bowel resection is positive for adenocarcinoma nodules in the serosal fat.  PT4PN2B.  MMR is preserved. - Operative report mentions obvious extension of the tumor outside the colon to  the pelvic brim, unable to fully excise all of the tumor in the pelvis. - NGS testing: KRAS/NRAS negative, BRAF negative, HER2 negative, MSI-stable - PET scan on 04/29/2021: Multifocal hypermetabolic nodule, peritoneal and anterior abdominal wall adenopathy.  Findings worrisome for peritoneal carcinomatosis.  No evidence of hepatic metastatic disease or distant mets. - FOLFOXIRI and Vectibix started on 05/17/2021 - We will see how she responds and will refer to Dr. Crisoforo Oxford for resection/HIPEC therapy.  Colonoscopy through stoma and rectal stump on 10/04/2021 was negative.    Social/family history: - She lives at home with her family.  She works as a Quarry manager at [employer].  Non-smoker. - Paternal cousin had cancer.  Maternal aunt had cancer.   PLAN:  Stage IV sigmoid colon adenocarcinoma, MSI-stable, RAS/BRAF negative: - Cycle 9 of chemotherapy with Vectibix was on 11/03/2021 - CEA level has gone up to 14.1 (11/03/2021) from 6.9 on 09/22/2021.  CT CAP on 11/15/2021 showed further decrease in size of soft tissue density at the root of the sigmoid mesocolon with no evidence of new/progressive disease.  No metastatic disease in the chest.  We will proceed with cycle 10 today.  We will dose reduce oxaliplatin.  She was told to call Dr. Lianne Moris office as she had some questions about surgery.  RTC 2 weeks for follow-up.  2.  Hypokalemia: - Continue potassium 20 mEq 4 times daily.  Potassium today is 3.6.  3.  Peripheral neuropathy: - After cycle 9, she started having numbness on and off in the left hand fingertips which goes away after shaking.  I will cut back oxaliplatin dose to 65 mg/m2.  4.  Microcytic anemia: - Continue iron tablet daily.  Hemoglobin improved to 12.  5.  Acneform rash: - Can start doxycycline if she develops any rash.   Orders placed this encounter:  No orders of the defined types were placed in this encounter.    Derek Jack, MD [place] [phone]

## 2021-11-17 NOTE — Patient Instructions (Signed)
[facility]  Discharge Instructions: Thank you for choosing [facility] to provide your oncology and hematology care.  If you have a lab appointment with the [facility], please come in thru the Main Entrance and check in at the main information desk.  Wear comfortable clothing and clothing appropriate for easy access to any Portacath or PICC line.   We strive to give you quality time with your provider. You may need to reschedule your appointment if you arrive late (15 or more minutes).  Arriving late affects you and other patients whose appointments are after yours.  Also, if you miss three or more appointments without notifying the office, you may be dismissed from the clinic at the provider's discretion.      For prescription refill requests, have your pharmacy contact our office and allow 72 hours for refills to be completed.    Today you received the following chemotherapy and/or immunotherapy agents FOLFOXIRI, chemo pump connected, return as scheduled.   To help prevent nausea and vomiting after your treatment, we encourage you to take your nausea medication as directed.  BELOW ARE SYMPTOMS THAT SHOULD BE REPORTED IMMEDIATELY: *FEVER GREATER THAN 100.4 F (38 C) OR HIGHER *CHILLS OR SWEATING *NAUSEA AND VOMITING THAT IS NOT CONTROLLED WITH YOUR NAUSEA MEDICATION *UNUSUAL SHORTNESS OF BREATH *UNUSUAL BRUISING OR BLEEDING *URINARY PROBLEMS (pain or burning when urinating, or frequent urination) *BOWEL PROBLEMS (unusual diarrhea, constipation, pain near the anus) TENDERNESS IN MOUTH AND THROAT WITH OR WITHOUT PRESENCE OF ULCERS (sore throat, sores in mouth, or a toothache) UNUSUAL RASH, SWELLING OR PAIN  UNUSUAL VAGINAL DISCHARGE OR ITCHING   Items with * indicate a potential emergency and should be followed up as soon as possible or go to the Emergency Department if any problems should occur.  Please show the CHEMOTHERAPY ALERT CARD or  IMMUNOTHERAPY ALERT CARD at check-in to the Emergency Department and triage nurse.  Should you have questions after your visit or need to cancel or reschedule your appointment, please contact [facility] [phone]  and follow the prompts.  Office hours are 8:00 a.m. to 4:30 p.m. Monday - Friday. Please note that voicemails left after 4:00 p.m. may not be returned until the following business day.  We are closed weekends and major holidays. You have access to a nurse at all times for urgent questions. Please call the main number to the clinic [phone] and follow the prompts.  For any non-urgent questions, you may also contact your provider using MyChart. We now offer e-Visits for anyone 71 and older to request care online for non-urgent symptoms. For details visit [email or web address].   Also download the MyChart app! Go to the app store, search "MyChart", open the app, select [facility], and log in with your MyChart username and password.  Masks are optional in the cancer centers. If you would like for your care team to wear a mask while they are taking care of you, please let them know. You may have one support person who is at least 51 years old accompany you for your appointments.

## 2021-11-17 NOTE — Patient Instructions (Signed)
[facility] at [facility] Discharge Instructions   You were seen and examined today by Dr. Delton Coombes.  He reviewed the results of your lab work which are normal/stable.   He reviewed the results of your CT scan which shows the cancer is further shrinking.   We will proceed with your treatment today.   Return as scheduled.    Thank you for choosing [facility] at [facility] to provide your oncology and hematology care.  To afford each patient quality time with our provider, please arrive at least 15 minutes before your scheduled appointment time.   If you have a lab appointment with the [facility] please come in thru the Main Entrance and check in at the main information desk.  You need to re-schedule your appointment should you arrive 10 or more minutes late.  We strive to give you quality time with our providers, and arriving late affects you and other patients whose appointments are after yours.  Also, if you no show three or more times for appointments you may be dismissed from the clinic at the providers discretion.     Again, thank you for choosing [facility].  Our hope is that these requests will decrease the amount of time that you wait before being seen by our physicians.       _____________________________________________________________  Should you have questions after your visit to [facility], please contact our office at [phone] and follow the prompts.  Our office hours are 8:00 a.m. and 4:30 p.m. Monday - Friday.  Please note that voicemails left after 4:00 p.m. may not be returned until the following business day.  We are closed weekends and major holidays.  You do have access to a nurse 24-7, just call the main number to the clinic [phone] and do not press any options, hold on the line and a nurse will answer the phone.    For prescription refill requests, have your pharmacy contact our  office and allow 72 hours.    Due to Covid, you will need to wear a mask upon entering the hospital. If you do not have a mask, a mask will be given to you at the Main Entrance upon arrival. For doctor visits, patients may have 1 support person age 71 or older with them. For treatment visits, patients can not have anyone with them due to social distancing guidelines and our immunocompromised population.

## 2021-11-17 NOTE — Progress Notes (Signed)
Confirmed dose of 5-Fluorouracil CIV to be 2400 mg/m2 for treatment and leucovorin at 200 mg/m2.   Dose in treatment plan updated to reflect change.  Today and future doses updated.   T.O. Dr Rhys Martini, PharmD

## 2021-11-17 NOTE — Progress Notes (Signed)
Patient okay for treatment today per Dr. Delton Coombes with Oxaliplatin reduced to 47m/m2. Patient tolerated chemotherapy with no complaints voiced. Side effects with management reviewed understanding verbalized. Port site clean and dry with no bruising or swelling noted at site. Good blood return noted before and after administration of chemotherapy. Chemo pump connected with no alarms noted. Patient left in satisfactory condition with VSS and no s/s of distress noted.

## 2021-11-18 ENCOUNTER — Other Ambulatory Visit: Payer: Self-pay

## 2021-11-19 ENCOUNTER — Inpatient Hospital Stay: Payer: Medicaid Other

## 2021-11-19 ENCOUNTER — Encounter: Payer: Self-pay | Admitting: Hematology

## 2021-11-19 ENCOUNTER — Encounter (HOSPITAL_COMMUNITY): Payer: Self-pay | Admitting: Hematology

## 2021-11-19 VITALS — BP 116/81 | HR 80 | Temp 98.3°F | Resp 18

## 2021-11-19 DIAGNOSIS — C189 Malignant neoplasm of colon, unspecified: Secondary | ICD-10-CM

## 2021-11-19 DIAGNOSIS — Z95828 Presence of other vascular implants and grafts: Secondary | ICD-10-CM

## 2021-11-19 DIAGNOSIS — C187 Malignant neoplasm of sigmoid colon: Secondary | ICD-10-CM | POA: Diagnosis not present

## 2021-11-19 MED ORDER — PEGFILGRASTIM-CBQV 6 MG/0.6ML ~~LOC~~ SOSY
6.0000 mg | PREFILLED_SYRINGE | Freq: Once | SUBCUTANEOUS | Status: AC
  Administered 2021-11-19: 6 mg via SUBCUTANEOUS

## 2021-11-19 MED ORDER — SODIUM CHLORIDE 0.9% FLUSH
10.0000 mL | INTRAVENOUS | Status: DC | PRN
  Administered 2021-11-19: 10 mL

## 2021-11-19 MED ORDER — HEPARIN SOD (PORK) LOCK FLUSH 100 UNIT/ML IV SOLN
500.0000 [IU] | Freq: Once | INTRAVENOUS | Status: AC | PRN
  Administered 2021-11-19: 500 [IU]

## 2021-11-19 NOTE — Progress Notes (Signed)
Patient presents today for pump d/c. Vital signs are stable. Port a cath site clean, dry, and intact. Port flushed with 10 mls of Normal Saline and 500 Units of Heparin. Needle removed intact. Band aid applied. Patient has no complaints at this time. Discharged from clinic ambulatory and in stable condition. Patient alert and oriented.

## 2021-11-19 NOTE — Patient Instructions (Signed)
[facility]  Discharge Instructions: Thank you for choosing [facility] to provide your oncology and hematology care.  If you have a lab appointment with the [facility], please come in thru the Main Entrance and check in at the main information desk.  Wear comfortable clothing and clothing appropriate for easy access to any Portacath or PICC line.   We strive to give you quality time with your provider. You may need to reschedule your appointment if you arrive late (15 or more minutes).  Arriving late affects you and other patients whose appointments are after yours.  Also, if you miss three or more appointments without notifying the office, you may be dismissed from the clinic at the provider's discretion.      For prescription refill requests, have your pharmacy contact our office and allow 72 hours for refills to be completed.    Today you received the following chemotherapy and/or immunotherapy agents Pump d/c. Adrucil.  Fluorouracil Injection What is this medication? FLUOROURACIL (flure oh YOOR a sil) treats some types of cancer. It works by slowing down the growth of cancer cells. This medicine may be used for other purposes; ask your health care provider or pharmacist if you have questions. COMMON BRAND NAME(S): Adrucil What should I tell my care team before I take this medication? They need to know if you have any of these conditions: Blood disorders Dihydropyrimidine dehydrogenase (DPD) deficiency Infection, such as chickenpox, cold sores, herpes Kidney disease Liver disease Poor nutrition Recent or ongoing radiation therapy An unusual or allergic reaction to fluorouracil, other medications, foods, dyes, or preservatives If you or your partner are pregnant or trying to get pregnant Breast-feeding How should I use this medication? This medication is injected into a vein. It is administered by your care team in a hospital or clinic  setting. Talk to your care team about the use of this medication in children. Special care may be needed. Overdosage: If you think you have taken too much of this medicine contact a poison control center or emergency room at once. NOTE: This medicine is only for you. Do not share this medicine with others. What if I miss a dose? Keep appointments for follow-up doses. It is important not to miss your dose. Call your care team if you are unable to keep an appointment. What may interact with this medication? Do not take this medication with any of the following: Live virus vaccines This medication may also interact with the following: Medications that treat or prevent blood clots, such as warfarin, enoxaparin, dalteparin This list may not describe all possible interactions. Give your health care provider a list of all the medicines, herbs, non-prescription drugs, or dietary supplements you use. Also tell them if you smoke, drink alcohol, or use illegal drugs. Some items may interact with your medicine. What should I watch for while using this medication? Your condition will be monitored carefully while you are receiving this medication. This medication may make you feel generally unwell. This is not uncommon as chemotherapy can affect healthy cells as well as cancer cells. Report any side effects. Continue your course of treatment even though you feel ill unless your care team tells you to stop. In some cases, you may be given additional medications to help with side effects. Follow all directions for their use. This medication may increase your risk of getting an infection. Call your care team for advice if you get a fever, chills, sore throat, or  other symptoms of a cold or flu. Do not treat yourself. Try to avoid being around people who are sick. This medication may increase your risk to bruise or bleed. Call your care team if you notice any unusual bleeding. Be careful brushing or flossing your  teeth or using a toothpick because you may get an infection or bleed more easily. If you have any dental work done, tell your dentist you are receiving this medication. Avoid taking medications that contain aspirin, acetaminophen, ibuprofen, naproxen, or ketoprofen unless instructed by your care team. These medications may hide a fever. Do not treat diarrhea with over the counter products. Contact your care team if you have diarrhea that lasts more than 2 days or if it is severe and watery. This medication can make you more sensitive to the sun. Keep out of the sun. If you cannot avoid being in the sun, wear protective clothing and sunscreen. Do not use sun lamps, tanning beds, or tanning booths. Talk to your care team if you or your partner wish to become pregnant or think you might be pregnant. This medication can cause serious birth defects if taken during pregnancy and for 3 months after the last dose. A reliable form of contraception is recommended while taking this medication and for 3 months after the last dose. Talk to your care team about effective forms of contraception. Do not father a child while taking this medication and for 3 months after the last dose. Use a condom while having sex during this time period. Do not breastfeed while taking this medication. This medication may cause infertility. Talk to your care team if you are concerned about your fertility. What side effects may I notice from receiving this medication? Side effects that you should report to your care team as soon as possible: Allergic reactions--skin rash, itching, hives, swelling of the face, lips, tongue, or throat Heart attack--pain or tightness in the chest, shoulders, arms, or jaw, nausea, shortness of breath, cold or clammy skin, feeling faint or lightheaded Heart failure--shortness of breath, swelling of the ankles, feet, or hands, sudden weight gain, unusual weakness or fatigue Heart rhythm changes--fast or  irregular heartbeat, dizziness, feeling faint or lightheaded, chest pain, trouble breathing High ammonia level--unusual weakness or fatigue, confusion, loss of appetite, nausea, vomiting, seizures Infection--fever, chills, cough, sore throat, wounds that don't heal, pain or trouble when passing urine, general feeling of discomfort or being unwell Low red blood cell level--unusual weakness or fatigue, dizziness, headache, trouble breathing Pain, tingling, or numbness in the hands or feet, muscle weakness, change in vision, confusion or trouble speaking, loss of balance or coordination, trouble walking, seizures Redness, swelling, and blistering of the skin over hands and feet Severe or prolonged diarrhea Unusual bruising or bleeding Side effects that usually do not require medical attention (report to your care team if they continue or are bothersome): Dry skin Headache Increased tears Nausea Pain, redness, or swelling with sores inside the mouth or throat Sensitivity to light Vomiting This list may not describe all possible side effects. Call your doctor for medical advice about side effects. You may report side effects to FDA at 1-800-FDA-1088. Where should I keep my medication? This medication is given in a hospital or clinic. It will not be stored at home. NOTE: This sheet is a summary. It may not cover all possible information. If you have questions about this medicine, talk to your doctor, pharmacist, or health care provider.  2023 [employer]vier/Gold Standard (2021-06-01 00:00:00)  To help prevent nausea and vomiting after your treatment, we encourage you to take your nausea medication as directed.  BELOW ARE SYMPTOMS THAT SHOULD BE REPORTED IMMEDIATELY: *FEVER GREATER THAN 100.4 F (38 C) OR HIGHER *CHILLS OR SWEATING *NAUSEA AND VOMITING THAT IS NOT CONTROLLED WITH YOUR NAUSEA MEDICATION *UNUSUAL SHORTNESS OF BREATH *UNUSUAL BRUISING OR BLEEDING *URINARY PROBLEMS (pain or  burning when urinating, or frequent urination) *BOWEL PROBLEMS (unusual diarrhea, constipation, pain near the anus) TENDERNESS IN MOUTH AND THROAT WITH OR WITHOUT PRESENCE OF ULCERS (sore throat, sores in mouth, or a toothache) UNUSUAL RASH, SWELLING OR PAIN  UNUSUAL VAGINAL DISCHARGE OR ITCHING   Items with * indicate a potential emergency and should be followed up as soon as possible or go to the Emergency Department if any problems should occur.  Please show the CHEMOTHERAPY ALERT CARD or IMMUNOTHERAPY ALERT CARD at check-in to the Emergency Department and triage nurse.  Should you have questions after your visit or need to cancel or reschedule your appointment, please contact [facility] [phone]  and follow the prompts.  Office hours are 8:00 a.m. to 4:30 p.m. Monday - Friday. Please note that voicemails left after 4:00 p.m. may not be returned until the following business day.  We are closed weekends and major holidays. You have access to a nurse at all times for urgent questions. Please call the main number to the clinic [phone] and follow the prompts.  For any non-urgent questions, you may also contact your provider using MyChart. We now offer e-Visits for anyone 32 and older to request care online for non-urgent symptoms. For details visit [email or web address].   Also download the MyChart app! Go to the app store, search "MyChart", open the app, select , and log in with your MyChart username and password.  Masks are optional in the cancer centers. If you would like for your care team to wear a mask while they are taking care of you, please let them know. You may have one support person who is at least 51 years old accompany you for your appointments.

## 2021-11-30 ENCOUNTER — Other Ambulatory Visit: Payer: Medicaid Other

## 2021-12-01 ENCOUNTER — Inpatient Hospital Stay: Payer: Medicaid Other | Admitting: Hematology

## 2021-12-01 ENCOUNTER — Inpatient Hospital Stay: Payer: Medicaid Other

## 2021-12-07 ENCOUNTER — Other Ambulatory Visit: Payer: Self-pay

## 2021-12-08 ENCOUNTER — Other Ambulatory Visit: Payer: Self-pay

## 2021-12-14 ENCOUNTER — Inpatient Hospital Stay: Payer: Medicaid Other | Attending: Hematology

## 2021-12-14 ENCOUNTER — Inpatient Hospital Stay (HOSPITAL_BASED_OUTPATIENT_CLINIC_OR_DEPARTMENT_OTHER): Payer: Medicaid Other | Admitting: Hematology

## 2021-12-14 ENCOUNTER — Inpatient Hospital Stay: Payer: Medicaid Other

## 2021-12-14 VITALS — BP 142/80 | HR 75 | Temp 99.0°F | Resp 18

## 2021-12-14 DIAGNOSIS — C187 Malignant neoplasm of sigmoid colon: Secondary | ICD-10-CM | POA: Diagnosis not present

## 2021-12-14 DIAGNOSIS — Z95828 Presence of other vascular implants and grafts: Secondary | ICD-10-CM | POA: Diagnosis not present

## 2021-12-14 DIAGNOSIS — I700 Atherosclerosis of aorta: Secondary | ICD-10-CM | POA: Insufficient documentation

## 2021-12-14 DIAGNOSIS — D509 Iron deficiency anemia, unspecified: Secondary | ICD-10-CM | POA: Insufficient documentation

## 2021-12-14 DIAGNOSIS — I10 Essential (primary) hypertension: Secondary | ICD-10-CM | POA: Insufficient documentation

## 2021-12-14 DIAGNOSIS — D649 Anemia, unspecified: Secondary | ICD-10-CM | POA: Insufficient documentation

## 2021-12-14 DIAGNOSIS — C189 Malignant neoplasm of colon, unspecified: Secondary | ICD-10-CM | POA: Diagnosis not present

## 2021-12-14 DIAGNOSIS — Z5111 Encounter for antineoplastic chemotherapy: Secondary | ICD-10-CM | POA: Insufficient documentation

## 2021-12-14 DIAGNOSIS — R21 Rash and other nonspecific skin eruption: Secondary | ICD-10-CM | POA: Diagnosis not present

## 2021-12-14 DIAGNOSIS — E876 Hypokalemia: Secondary | ICD-10-CM | POA: Diagnosis not present

## 2021-12-14 DIAGNOSIS — D259 Leiomyoma of uterus, unspecified: Secondary | ICD-10-CM | POA: Diagnosis not present

## 2021-12-14 DIAGNOSIS — Z79899 Other long term (current) drug therapy: Secondary | ICD-10-CM | POA: Diagnosis not present

## 2021-12-14 DIAGNOSIS — G629 Polyneuropathy, unspecified: Secondary | ICD-10-CM | POA: Diagnosis not present

## 2021-12-14 LAB — COMPREHENSIVE METABOLIC PANEL
ALT: 20 U/L (ref 0–44)
AST: 22 U/L (ref 15–41)
Albumin: 3.5 g/dL (ref 3.5–5.0)
Alkaline Phosphatase: 58 U/L (ref 38–126)
Anion gap: 10 (ref 5–15)
BUN: 6 mg/dL (ref 6–20)
CO2: 22 mmol/L (ref 22–32)
Calcium: 8.8 mg/dL — ABNORMAL LOW (ref 8.9–10.3)
Chloride: 110 mmol/L (ref 98–111)
Creatinine, Ser: 0.68 mg/dL (ref 0.44–1.00)
GFR, Estimated: >60 mL/min (ref >60)
Glucose, Bld: 94 mg/dL (ref 70–99)
Potassium: 3.5 mmol/L (ref 3.5–5.1)
Sodium: 142 mmol/L (ref 135–145)
Total Bilirubin: 0.7 mg/dL (ref 0.3–1.2)
Total Protein: 6.9 g/dL (ref 6.5–8.1)

## 2021-12-14 LAB — CBC WITH DIFFERENTIAL/PLATELET
Abs Immature Granulocytes: 0.02 10*3/uL (ref 0.00–0.07)
Basophils Absolute: 0.1 10*3/uL (ref 0.0–0.1)
Basophils Relative: 1 %
Eosinophils Absolute: 0.1 10*3/uL (ref 0.0–0.5)
Eosinophils Relative: 3 %
HCT: 33.3 % — ABNORMAL LOW (ref 36.0–46.0)
Hemoglobin: 11.3 g/dL — ABNORMAL LOW (ref 12.0–15.0)
Immature Granulocytes: 0 %
Lymphocytes Relative: 43 %
Lymphs Abs: 2.1 10*3/uL (ref 0.7–4.0)
MCH: 32.1 pg (ref 26.0–34.0)
MCHC: 33.9 g/dL (ref 30.0–36.0)
MCV: 94.6 fL (ref 80.0–100.0)
Monocytes Absolute: 0.7 10*3/uL (ref 0.1–1.0)
Monocytes Relative: 15 %
Neutro Abs: 1.8 10*3/uL (ref 1.7–7.7)
Neutrophils Relative %: 38 %
Platelets: 249 10*3/uL (ref 150–400)
RBC: 3.52 MIL/uL — ABNORMAL LOW (ref 3.87–5.11)
RDW: 16.2 % — ABNORMAL HIGH (ref 11.5–15.5)
WBC: 4.8 10*3/uL (ref 4.0–10.5)
nRBC: 0 % (ref 0.0–0.2)

## 2021-12-14 LAB — MAGNESIUM: Magnesium: 1.7 mg/dL (ref 1.7–2.4)

## 2021-12-14 MED ORDER — SODIUM CHLORIDE 0.9 % IV SOLN: Freq: Once | INTRAVENOUS | Status: AC

## 2021-12-14 MED ORDER — SODIUM CHLORIDE 0.9 % IV SOLN
200.0000 mg/m2 | Freq: Once | INTRAVENOUS | Status: AC
  Administered 2021-12-14: 332 mg via INTRAVENOUS
  Filled 2021-12-14: qty 16.6

## 2021-12-14 MED ORDER — PALONOSETRON HCL INJECTION 0.25 MG/5ML
0.2500 mg | Freq: Once | INTRAVENOUS | Status: AC
  Administered 2021-12-14: 0.25 mg via INTRAVENOUS
  Filled 2021-12-14: qty 5

## 2021-12-14 MED ORDER — SODIUM CHLORIDE 0.9 % IV SOLN
10.0000 mg | Freq: Once | INTRAVENOUS | Status: AC
  Administered 2021-12-14: 10 mg via INTRAVENOUS
  Filled 2021-12-14: qty 10

## 2021-12-14 MED ORDER — SODIUM CHLORIDE 0.9 % IV SOLN
2400.0000 mg/m2 | INTRAVENOUS | Status: DC
  Administered 2021-12-14: 4000 mg via INTRAVENOUS
  Filled 2021-12-14: qty 80

## 2021-12-14 MED ORDER — SODIUM CHLORIDE 0.9 % IV SOLN
150.0000 mg | Freq: Once | INTRAVENOUS | Status: AC
  Administered 2021-12-14: 150 mg via INTRAVENOUS
  Filled 2021-12-14: qty 5

## 2021-12-14 MED ORDER — SODIUM CHLORIDE 0.9 % IV SOLN
165.0000 mg/m2 | Freq: Once | INTRAVENOUS | Status: AC
  Administered 2021-12-14: 280 mg via INTRAVENOUS
  Filled 2021-12-14: qty 10

## 2021-12-14 MED ORDER — SODIUM CHLORIDE 0.9% FLUSH
10.0000 mL | INTRAVENOUS | Status: DC | PRN
  Administered 2021-12-14: 10 mL

## 2021-12-14 MED ORDER — SODIUM CHLORIDE 0.9 % IV SOLN
6.0000 mg/kg | Freq: Once | INTRAVENOUS | Status: AC
  Administered 2021-12-14: 400 mg via INTRAVENOUS
  Filled 2021-12-14: qty 20

## 2021-12-14 MED ORDER — ATROPINE SULFATE 1 MG/ML IV SOLN
0.5000 mg | Freq: Once | INTRAVENOUS | Status: AC
  Administered 2021-12-14: 0.5 mg via INTRAVENOUS
  Filled 2021-12-14: qty 1

## 2021-12-14 MED ORDER — HEPARIN SOD (PORK) LOCK FLUSH 100 UNIT/ML IV SOLN: 500.0000 [IU] | Freq: Once | INTRAVENOUS | Status: DC | PRN

## 2021-12-14 MED ORDER — SODIUM CHLORIDE 0.9% FLUSH
10.0000 mL | INTRAVENOUS | Status: DC | PRN
  Administered 2021-12-14: 10 mL via INTRAVENOUS

## 2021-12-14 NOTE — Progress Notes (Signed)
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  PRIOR THERAPY: Sigmoid colon resection, partial small bowel resection and Hartman's procedure on 03/26/2021  NGS Results: KRAS/NRAS negative, BRAF negative, HER2 negative, MSI-stable  CURRENT THERAPY: FOLFOXIRI + Panitumumab q14d  BRIEF ONCOLOGIC HISTORY:  Oncology History  Colon carcinoma ([place])  03/26/2021 Initial Diagnosis   Colon carcinoma ([place])   04/06/2021 Cancer Staging   Staging form: Colon and Rectum, AJCC 8th Edition - Clinical stage from 04/06/2021: Stage IVC (cT4a, cN2b, pM1c) - Signed by Derek Jack, MD on 05/04/2021 Histopathologic type: Adenocarcinoma, NOS Stage prefix: Initial diagnosis Total positive nodes: 9 Total nodes examined: 9 Tumor deposits (TD): Present Carcinoembryonic antigen (CEA) (ng/mL): 61.5 Perineural invasion (PNI): Present Microsatellite instability (MSI): Stable KRAS mutation: Negative NRAS mutation: Negative BRAF mutation: Negative   05/17/2021 - 10/08/2021 Chemotherapy   Patient is on Treatment Plan : COLORECTAL FOLFOXIRI + Panitumumab q14d     05/17/2021 -  Chemotherapy   Patient is on Treatment Plan : COLORECTAL FOLFOXIRI q14d       CANCER STAGING:  Cancer Staging  Colon carcinoma [facility]) Staging form: Colon and Rectum, AJCC 8th Edition - Clinical stage from 04/06/2021: Stage IVC (cT4a, cN2b, pM1c) - Signed by Derek Jack, MD on 05/04/2021   INTERVAL HISTORY:  Ms. Keonna Raether, a 51 y.o. female, here for follow-up of metastatic colon cancer and toxicity assessment prior to next cycle of chemotherapy.  She reported improvement in the left hand fingertips numbness.  She reported tingling in the feet on and off since the last treatment.   Denied any GI side effects.  REVIEW OF SYSTEMS:  Review of Systems  Constitutional:  Negative for appetite change and fatigue.  Neurological:  Positive for numbness (In the feet on and off since last treatment).  All other systems reviewed and are negative.   PAST MEDICAL/SURGICAL HISTORY:  Past Medical History:  Diagnosis Date   Anemia    Back pain    Hypertension    Port-A-Cath in place 05/10/2021   Past Surgical History:  Procedure Laterality Date   BIOPSY  03/02/2021   Procedure: BIOPSY;  Surgeon: Eloise Harman, DO;  [facility];  Service: Endoscopy;;   BOWEL RESECTION N/A 03/26/2021   Procedure: SMALL BOWEL RESECTION;  Surgeon: Aviva Signs, MD;  Location: [facility] ORS;  Service: General;  Laterality: N/A;   CHOLECYSTECTOMY     COLONOSCOPY WITH PROPOFOL N/A 10/04/2021   Procedure: COLONOSCOPY WITH PROPOFOL;  Surgeon: Eloise Harman, DO;  [facility];  Service: Endoscopy;  Laterality: N/A;  12:15PM, VIA OSTOMY   COLOSTOMY N/A 03/26/2021   Procedure: COLOSTOMY;  Surgeon: Aviva Signs, MD;  Location: [facility] OR[facility];  Service: General;  Laterality: N/A;   ESOPHAGOGASTRODUODENOSCOPY (EGD) WITH PROPOFOL N/A 03/02/2021   Procedure: ESOPHAGOGASTRODUODENOSCOPY (EGD) WITH PROPOFOL;  Surgeon: Eloise Harman, DO;  [facility];  Service: Endoscopy;  Laterality: N/A;   PARTIAL COLECTOMY N/A 03/26/2021   Procedure: PARTIAL COLECTOMY;  Surgeon: Aviva Signs, MD;  Location: [facility];  Service: General;  Laterality: N/A;   PORTACATH PLACEMENT Left 04/14/2021   Procedure: INSERTION PORT-A-CATH;  Surgeon: Aviva Signs, MD;  Location: [facility];  Service: General;  Laterality: Left;   SCLEROTHERAPY  03/02/2021   Procedure:  SCLEROTHERAPY;  Surgeon: Eloise Harman, DO;  [facility];  Service: Endoscopy;;   SIGMOIDOSCOPY  03/02/2021   Procedure: SIGMOIDOSCOPY;  Surgeon: Eloise Harman, DO;  [facility];  Service: Endoscopy;;    SOCIAL HISTORY:   Social History   Socioeconomic History   Marital status: Married    Spouse name: Not on file   Number of children: Not on file   Years of education: Not on file   Highest education level: Not on file  Occupational History   Not on file  Tobacco Use   Smoking status: Never   Smokeless tobacco: Never  Vaping Use   Vaping Use: Never used  Substance and Sexual Activity   Alcohol use: No   Drug use: No   Sexual activity: Not on file  Other Topics Concern   Not on file  Social History Narrative   Not on file   Social Determinants of Health   Financial Resource Strain: High Risk (05/19/2021)   Overall Financial Resource Strain (CARDIA)    Difficulty of Paying Living Expenses: Hard  Food Insecurity: Not on file  Transportation Needs: Not on file  Physical Activity: Not on file  Stress: Not on file  Social Connections: Not on file  Intimate Partner Violence: Not on file    FAMILY HISTORY:  Family History  Problem Relation Age of Onset   Hypertension Mother    Hypertension Father    Colon cancer Neg Hx    Colon polyps Neg Hx    Inflammatory bowel disease Neg Hx     CURRENT MEDICATIONS:  Current Outpatient Medications  Medication Sig Dispense Refill   doxycycline (VIBRA-TABS) 100 MG tablet Take 1 tablet (100 mg total) by mouth 2 (two) times daily. 90 tablet 3   ferrous sulfate 325 (65 FE) MG tablet TAKE 325 MG BY MOUTH DAILY. (Patient taking differently: 325 mg daily with breakfast.) 90 tablet 1   fexofenadine (ALLEGRA) 180 MG tablet Take 1 tablet (180 mg total) by mouth daily. 30 tablet 0   fluorouracil CALGB 70962 2,400 mg/m2 in sodium chloride 0.9 % 150 mL Inject 2,400 mg/m2 into the vein over 48 hr. Every 14 days     FLUOROURACIL IV Inject into the vein every 14 (fourteen) days.     fluticasone (FLONASE) 50 MCG/ACT nasal spray Place 2 sprays into both nostrils daily. 16 g 12   hydrocortisone 1 % lotion Apply 1 application. topically 2 (two) times daily. 118 mL 5    lidocaine (XYLOCAINE) 2 % solution Use as directed 10 mLs in the mouth or throat 4 (four) times daily as needed for mouth pain (Mix 1:1 with carafate and swish and spit). 100 mL ML   magnesium oxide (MAG-OX) 400 (240 Mg) MG tablet Take 1 tablet (400 mg total) by mouth 2 (two) times daily. 60 tablet 6   medroxyPROGESTERone (DEPO-PROVERA) 150 MG/ML injection Inject 150 mg into the muscle every 3 (three) months.     megestrol (MEGACE) 400 MG/10ML suspension Take 10 mLs (400 mg total) by mouth 2 (two) times daily. 240 mL 2   metroNIDAZOLE (METROGEL) 0.75 % vaginal gel Place vaginally at bedtime.     Multiple Vitamin (MULTIVITAMIN) tablet Take 1 tablet by mouth daily.     nebivolol (BYSTOLIC) 10 MG tablet Take 1 tablet (10 mg total) by mouth daily. 30 tablet 1   OXALIPLATIN IV Inject into the vein every 14 (fourteen) days.     Panitumumab (VECTIBIX IV) Inject into  the vein every 14 (fourteen) days.     pantoprazole (PROTONIX) 40 MG tablet Take 40 mg by mouth daily as needed (acid reflux).     potassium chloride SA (KLOR-CON M20) 20 MEQ tablet Take 1 tablet (20 mEq total) by mouth in the morning, at noon, in the evening, and at bedtime. 120 tablet 3   sucralfate (CARAFATE) 1 GM/10ML suspension Take 10 mLs (1 g total) by mouth 4 (four) times daily as needed. Mix 1:1 with 2% lidocaine viscous solution and swish and spit 420 mL 6   No current facility-administered medications for this visit.   Facility-Administered Medications Ordered in Other Visits  Medication Dose Route Frequency Provider Last Rate Last Admin   fluorouracil (ADRUCIL) 4,000 mg in sodium chloride 0.9 % 70 mL chemo infusion  2,400 mg/m2 (Treatment Plan Recorded) Intravenous 1 day or 1 dose Derek Jack, MD   Infusion Verify at 12/14/21 1436   heparin lock flush 100 unit/mL  500 Units Intracatheter Once PRN Derek Jack, MD       sodium chloride flush (NS) 0.9 % injection 10 mL  10 mL Intracatheter PRN Derek Jack,  MD   10 mL at 12/14/21 1354    ALLERGIES:  Allergies  Allergen Reactions   Zithromax [Azithromycin] Hives    infusing arm began to swell , her face got red, and she broke out, there are bumps around where the IV site was infusing   Motrin [Ibuprofen] Itching   Sudafed [Pseudoephedrine Hcl] Other (See Comments)    Hypertension   Zestril [Lisinopril] Cough    PHYSICAL EXAM:  Performance status (ECOG): 1 - Symptomatic but completely ambulatory  There were no vitals filed for this visit.  Wt Readings from Last 3 Encounters:  12/14/21 144 lb 12.8 oz (65.7 kg)  11/17/21 140 lb 3.2 oz (63.6 kg)  11/03/21 139 lb 12.8 oz (63.4 kg)   Physical Exam Vitals reviewed.  Constitutional:      Appearance: Normal appearance.  Cardiovascular:     Rate and Rhythm: Normal rate and regular rhythm.     Pulses: Normal pulses.     Heart sounds: Normal heart sounds.  Pulmonary:     Effort: Pulmonary effort is normal.     Breath sounds: Normal breath sounds.  Neurological:     General: No focal deficit present.     Mental Status: She is alert and oriented to person, place, and time.  Psychiatric:        Mood and Affect: Mood normal.        Behavior: Behavior normal.     LABORATORY DATA:  I have reviewed the labs as listed.     Latest Ref Rng & Units 12/14/2021    8:36 AM 11/17/2021    8:49 AM 11/03/2021    8:43 AM  CBC  WBC 4.0 - 10.5 K/uL 4.8  5.3  5.0   Hemoglobin 12.0 - 15.0 g/dL 11.3  12.0  11.6   Hematocrit 36.0 - 46.0 % 33.3  35.5  34.7   Platelets 150 - 400 K/uL 249  159  271       Latest Ref Rng & Units 12/14/2021    8:36 AM 11/17/2021    8:49 AM 11/03/2021    8:43 AM  CMP  Glucose 70 - 99 mg/dL 94  94  88   BUN 6 - 20 mg/dL _0 Creatinine 0.44 - 1.00 mg/dL 0.68  0.56  0.65   Sodium 135 -  145 mmol/L 142  140  138   Potassium 3.5 - 5.1 mmol/L 3.5  3.6  3.9   Chloride 98 - 111 mmol/L 110  110  111   CO2 22 - 32 mmol/L _0 Calcium 8.9 - 10.3 mg/dL 8.8  9.0   8.8   Total Protein 6.5 - 8.1 g/dL 6.9  7.3  7.0   Total Bilirubin 0.3 - 1.2 mg/dL 0.7  0.2  0.7   Alkaline Phos 38 - 126 U/L 58  82  57   AST 15 - 41 U/L _1 ALT 0 - 44 U/L _2 DIAGNOSTIC IMAGING:  I have independently reviewed the scans and discussed with the patient. CT CHEST ABDOMEN PELVIS W CONTRAST  Result Date: 11/16/2021 CLINICAL DATA:  Stage IV sigmoid colon adenocarcinoma. Resection and currently on chemotherapy. * Tracking Code: BO * EXAM: CT CHEST, ABDOMEN, AND PELVIS WITH CONTRAST TECHNIQUE: Multidetector CT imaging of the chest, abdomen and pelvis was performed following the standard protocol during bolus administration of intravenous contrast. RADIATION DOSE REDUCTION: This exam was performed according to the departmental dose-optimization program which includes automated exposure control, adjustment of the mA and/or kV according to patient size and/or use of iterative reconstruction technique. CONTRAST:  29m OMNIPAQUE IOHEXOL 300 MG/ML  SOLN COMPARISON:  08/16/2021 CTs.  PET 04/29/2021 FINDINGS: CT CHEST FINDINGS Cardiovascular: Left Port-A-Cath tip low SVC. Normal heart size, without pericardial effusion. No central pulmonary embolism, on this non-dedicated study. Ultrasound. Mediastinum/Nodes: No mediastinal or hilar adenopathy. Lungs/Pleura: No pleural fluid. Subpleural 3 mm right middle lobe pulmonary nodule on 81/3 is similar to on the prior and likely a subpleural lymph node. Musculoskeletal: No acute osseous abnormality. CT ABDOMEN PELVIS FINDINGS Hepatobiliary: Normal liver. Cholecystectomy, without biliary ductal dilatation. Pancreas: Normal, without mass or ductal dilatation. Spleen: Normal in size, without focal abnormality. Adrenals/Urinary Tract: Normal adrenal glands. Normal kidneys, without hydronephrosis. Normal urinary bladder. Air within the nondependent bladder including on 102/2. Stomach/Bowel: Normal stomach, without wall thickening. Hartmann's  pouch. Prior enterotomy within the ileum. Vascular/Lymphatic: Aortic atherosclerosis. No abdominal adenopathy. 7 mm soft tissue density within the root of the sigmoid mesocolon on 73/2 is decreased from a 9 mm on the prior. No pelvic sidewall adenopathy. Reproductive: Fibroid uterus. Endometrium of 1.8 cm on sagittal image 98, similar. No adnexal mass. Other: No significant free fluid. No evidence of omental or peritoneal disease. Trace fluid adjacent the right ovary medially, felt to be decreased on 89/2. Musculoskeletal: No acute osseous abnormality. IMPRESSION: 1. Status post low anterior resection and Hartmann's pouch. 2. Further decrease in size of soft tissue density at the root of the sigmoid mesocolon. No evidence of new/progressive disease. 3.  No acute process or evidence of metastatic disease in the chest. 4. Air in the urinary bladder.  Correlate with instrumentation. 5. Fibroid uterus. Similar endometrial thickening of 1.8 cm. Especially if the patient is postmenopausal, consider further evaluation with pelvic ultrasound. Electronically Signed   By: KAbigail MiyamotoM.D.   On: 11/16/2021 15:48     ASSESSMENT:  PT4PN2B N1C stage IV sigmoid colon cancer, NRAS negative: - Colonoscopy on 03/02/2021: Nonbleeding internal hemorrhoids, severe stenosis in the sigmoid colon at approximately 18 cm from anal verge, unable to be traversed with pediatric colonoscopy.  Biopsy consistent with moderately to poorly differentiated adenocarcinoma. - CTAP with contrast on 01/10/2021: Diffusely thickened, 10 cm segment of distal sigmoid colon  with adjacent moderate inflammatory reaction, suspected 2 sites of small bowel fistulization to the diseased segment and a rim-enhancing low-density 2.5 x 1.8 cm stricture medial to the mid segment which could represent a necrotic lymph node or contained perforation/abscess.  There are bilateral mildly enlarged common iliac chain nodes.  Mildly dilated mid to lower abdominal small bowel  segments.  Mildly prominent slightly steatotic liver. - Sigmoid colon resection, partial small bowel resection and Hartman's procedure on 03/26/2021: Moderately to poorly differentiated adenocarcinoma involving the subserosal adipose tissue and present at the margin.  9/9 lymph nodes positive for metastatic carcinoma.  Small bowel resection is positive for adenocarcinoma nodules in the serosal fat.  PT4PN2B.  MMR is preserved. - Operative report mentions obvious extension of the tumor outside the colon to the pelvic brim, unable to fully excise all of the tumor in the pelvis. - NGS testing: KRAS/NRAS negative, BRAF negative, HER2 negative, MSI-stable - PET scan on 04/29/2021: Multifocal hypermetabolic nodule, peritoneal and anterior abdominal wall adenopathy.  Findings worrisome for peritoneal carcinomatosis.  No evidence of hepatic metastatic disease or distant mets. - FOLFOXIRI and Vectibix started on 05/17/2021 - We will see how she responds and will refer to Dr. Crisoforo Oxford for resection/HIPEC therapy.  Colonoscopy through stoma and rectal stump on 10/04/2021 was negative.    Social/family history: - She lives at home with her family.  She works as a Quarry manager at [employer].  Non-smoker. - Paternal cousin had cancer.  Maternal aunt had cancer.   PLAN:  Stage IV sigmoid colon adenocarcinoma, MSI-stable, RAS/BRAF negative: -CT CAP on 11/15/2021 showed further decrease in size of soft tissue density at the root of the sigmoid mesocolon with no evidence of new/progressive disease.  No metastatic disease in the chest. - Reviewed labs today which showed normal LFTs.  CBC was grossly adequate to proceed with treatment.  Last CEA was 14.1. - I will discontinue oxaliplatin today and continue with FOLFIRI and Vectibix. - She asked me if she can have ostomy reversal.  I have told her to contact Dr. Crisoforo Oxford. - Proceed with treatment today and in 2 weeks.  RTC 4 weeks for follow-up.  We will also consider  discontinuing CPT-11 after next scan.  2.  Hypokalemia: - Continue potassium 20 mEq 4 times daily.  Potassium is 3.5.  3.  Peripheral neuropathy: - After cycle 9 she had on and off numbness in the left hand fingertips which subsided.  I have cut back on oxaliplatin dose by 20% at last visit.  Since last cycle she has tingling in the feet on and off. - I will discontinue oxaliplatin at this time.  4.  Microcytic anemia: - Continue iron tablet daily.  Hemoglobin is 11.3 today.  5.  Acneform rash: - Restart doxycycline if she develops rash.   Orders placed this encounter:  No orders of the defined types were placed in this encounter.    Derek Jack, MD [place] [phone]

## 2021-12-14 NOTE — Patient Instructions (Signed)
[place] at [facility] Discharge Instructions   You were seen and examined today by Dr. Delton Coombes.  He reviewed the results of your lab work which are normal/stable.  We will discontinue oxaliplatin due to neuropathy.   We will proceed with your treatment today.  Return as scheduled in 2 weeks.    Thank you for choosing [place] at [facility] to provide your oncology and hematology care.  To afford each patient quality time with our provider, please arrive at least 15 minutes before your scheduled appointment time.   If you have a lab appointment with the [facility] please come in thru the Main Entrance and check in at the main information desk.  You need to re-schedule your appointment should you arrive 10 or more minutes late.  We strive to give you quality time with our providers, and arriving late affects you and other patients whose appointments are after yours.  Also, if you no show three or more times for appointments you may be dismissed from the clinic at the providers discretion.     Again, thank you for choosing [facility].  Our hope is that these requests will decrease the amount of time that you wait before being seen by our physicians.       _____________________________________________________________  Should you have questions after your visit to [facility], please contact our office at [phone] and follow the prompts.  Our office hours are 8:00 a.m. and 4:30 p.m. Monday - Friday.  Please note that voicemails left after 4:00 p.m. may not be returned until the following business day.  We are closed weekends and major holidays.  You do have access to a nurse 24-7, just call the main number to the clinic [phone] and do not press any options, hold on the line and a nurse will answer the phone.    For prescription refill requests, have your pharmacy contact our office and allow 72 hours.     Due to Covid, you will need to wear a mask upon entering the hospital. If you do not have a mask, a mask will be given to you at the Main Entrance upon arrival. For doctor visits, patients may have 1 support person age 28 or older with them. For treatment visits, patients can not have anyone with them due to social distancing guidelines and our immunocompromised population.

## 2021-12-14 NOTE — Progress Notes (Signed)
Patient has been examined by Dr. Delton Coombes, and vital signs and labs have been reviewed. ANC, Creatinine, LFTs, hemoglobin, and platelets are within treatment parameters per M.D. - pt may proceed with treatment. D/c oxaliplatin per MD d/t neuropathy. Primary RN and pharmacy notified.

## 2021-12-14 NOTE — Progress Notes (Signed)
Patient presents today for Folfoxiri and f/u visit with Dr. Delton Coombes. Labs reviewed and within parameters for treatment. Blood pressure elevated on arrival. 148/90 on recheck. Vital signs within parameters for today's treatment.   Message received from A. Ouida Sills RN / Dr. Delton Coombes to proceed with treatment. Oxaliplatin discontinued due to neuropathy per Dr. Delton Coombes / A. Ouida Sills RN secure chat message.   Treatment given today per MD orders. Tolerated infusion without adverse affects. Vital signs stable. No complaints at this time. 5FU pump infusing and RUN noted on the screen. Discharged from clinic ambulatory in stable condition. Alert and oriented x 3. F/U with [facility] as scheduled.

## 2021-12-14 NOTE — Patient Instructions (Signed)
[facility]  Discharge Instructions: Thank you for choosing [facility] to provide your oncology and hematology care.  If you have a lab appointment with the [facility], please come in thru the Main Entrance and check in at the main information desk.  Wear comfortable clothing and clothing appropriate for easy access to any Portacath or PICC line.   We strive to give you quality time with your provider. You may need to reschedule your appointment if you arrive late (15 or more minutes).  Arriving late affects you and other patients whose appointments are after yours.  Also, if you miss three or more appointments without notifying the office, you may be dismissed from the clinic at the provider's discretion.      For prescription refill requests, have your pharmacy contact our office and allow 72 hours for refills to be completed.    Today you received the following chemotherapy and/or immunotherapy agents Vectibix, irinotecan, oxaliplatin, 5FU pump. Fluorouracil Injection What is this medication? FLUOROURACIL (flure oh YOOR a sil) treats some types of cancer. It works by slowing down the growth of cancer cells. This medicine may be used for other purposes; ask your health care provider or pharmacist if you have questions. COMMON BRAND NAME(S): Adrucil What should I tell my care team before I take this medication? They need to know if you have any of these conditions: Blood disorders Dihydropyrimidine dehydrogenase (DPD) deficiency Infection, such as chickenpox, cold sores, herpes Kidney disease Liver disease Poor nutrition Recent or ongoing radiation therapy An unusual or allergic reaction to fluorouracil, other medications, foods, dyes, or preservatives If you or your partner are pregnant or trying to get pregnant Breast-feeding How should I use this medication? This medication is injected into a vein. It is administered by your care team in a  hospital or clinic setting. Talk to your care team about the use of this medication in children. Special care may be needed. Overdosage: If you think you have taken too much of this medicine contact a poison control center or emergency room at once. NOTE: This medicine is only for you. Do not share this medicine with others. What if I miss a dose? Keep appointments for follow-up doses. It is important not to miss your dose. Call your care team if you are unable to keep an appointment. What may interact with this medication? Do not take this medication with any of the following: Live virus vaccines This medication may also interact with the following: Medications that treat or prevent blood clots, such as warfarin, enoxaparin, dalteparin This list may not describe all possible interactions. Give your health care provider a list of all the medicines, herbs, non-prescription drugs, or dietary supplements you use. Also tell them if you smoke, drink alcohol, or use illegal drugs. Some items may interact with your medicine. What should I watch for while using this medication? Your condition will be monitored carefully while you are receiving this medication. This medication may make you feel generally unwell. This is not uncommon as chemotherapy can affect healthy cells as well as cancer cells. Report any side effects. Continue your course of treatment even though you feel ill unless your care team tells you to stop. In some cases, you may be given additional medications to help with side effects. Follow all directions for their use. This medication may increase your risk of getting an infection. Call your care team for advice if you get a fever, chills, sore throat,  or other symptoms of a cold or flu. Do not treat yourself. Try to avoid being around people who are sick. This medication may increase your risk to bruise or bleed. Call your care team if you notice any unusual bleeding. Be careful brushing  or flossing your teeth or using a toothpick because you may get an infection or bleed more easily. If you have any dental work done, tell your dentist you are receiving this medication. Avoid taking medications that contain aspirin, acetaminophen, ibuprofen, naproxen, or ketoprofen unless instructed by your care team. These medications may hide a fever. Do not treat diarrhea with over the counter products. Contact your care team if you have diarrhea that lasts more than 2 days or if it is severe and watery. This medication can make you more sensitive to the sun. Keep out of the sun. If you cannot avoid being in the sun, wear protective clothing and sunscreen. Do not use sun lamps, tanning beds, or tanning booths. Talk to your care team if you or your partner wish to become pregnant or think you might be pregnant. This medication can cause serious birth defects if taken during pregnancy and for 3 months after the last dose. A reliable form of contraception is recommended while taking this medication and for 3 months after the last dose. Talk to your care team about effective forms of contraception. Do not father a child while taking this medication and for 3 months after the last dose. Use a condom while having sex during this time period. Do not breastfeed while taking this medication. This medication may cause infertility. Talk to your care team if you are concerned about your fertility. What side effects may I notice from receiving this medication? Side effects that you should report to your care team as soon as possible: Allergic reactions--skin rash, itching, hives, swelling of the face, lips, tongue, or throat Heart attack--pain or tightness in the chest, shoulders, arms, or jaw, nausea, shortness of breath, cold or clammy skin, feeling faint or lightheaded Heart failure--shortness of breath, swelling of the ankles, feet, or hands, sudden weight gain, unusual weakness or fatigue Heart rhythm  changes--fast or irregular heartbeat, dizziness, feeling faint or lightheaded, chest pain, trouble breathing High ammonia level--unusual weakness or fatigue, confusion, loss of appetite, nausea, vomiting, seizures Infection--fever, chills, cough, sore throat, wounds that don't heal, pain or trouble when passing urine, general feeling of discomfort or being unwell Low red blood cell level--unusual weakness or fatigue, dizziness, headache, trouble breathing Pain, tingling, or numbness in the hands or feet, muscle weakness, change in vision, confusion or trouble speaking, loss of balance or coordination, trouble walking, seizures Redness, swelling, and blistering of the skin over hands and feet Severe or prolonged diarrhea Unusual bruising or bleeding Side effects that usually do not require medical attention (report to your care team if they continue or are bothersome): Dry skin Headache Increased tears Nausea Pain, redness, or swelling with sores inside the mouth or throat Sensitivity to light Vomiting This list may not describe all possible side effects. Call your doctor for medical advice about side effects. You may report side effects to FDA at 1-800-FDA-1088. Where should I keep my medication? This medication is given in a hospital or clinic. It will not be stored at home. NOTE: This sheet is a summary. It may not cover all possible information. If you have questions about this medicine, talk to your doctor, pharmacist, or health care provider.  2023 Elsevier/Gold Standard (2021-05-25 00:00:00) Leucovorin  Injection What is this medication? LEUCOVORIN (loo koe VOR in) prevents side effects from certain medications, such as methotrexate. It works by increasing folate levels. This helps protect healthy cells in your body. It may also be used to treat anemia caused by low levels of folate. It can also be used with fluorouracil, a type of chemotherapy, to treat colorectal cancer. It works by  increasing the effects of fluorouracil in the body. This medicine may be used for other purposes; ask your health care provider or pharmacist if you have questions. What should I tell my care team before I take this medication? They need to know if you have any of these conditions: Anemia from low levels of vitamin B12 in the blood An unusual or allergic reaction to leucovorin, folic acid, other medications, foods, dyes, or preservatives Pregnant or trying to get pregnant Breastfeeding How should I use this medication? This medication is injected into a vein or a muscle. It is given by your care team in a hospital or clinic setting. Talk to your care team about the use of this medication in children. Special care may be needed. Overdosage: If you think you have taken too much of this medicine contact a poison control center or emergency room at once. NOTE: This medicine is only for you. Do not share this medicine with others. What if I miss a dose? Keep appointments for follow-up doses. It is important not to miss your dose. Call your care team if you are unable to keep an appointment. What may interact with this medication? Capecitabine Fluorouracil Phenobarbital Phenytoin Primidone Trimethoprim;sulfamethoxazole This list may not describe all possible interactions. Give your health care provider a list of all the medicines, herbs, non-prescription drugs, or dietary supplements you use. Also tell them if you smoke, drink alcohol, or use illegal drugs. Some items may interact with your medicine. What should I watch for while using this medication? Your condition will be monitored carefully while you are receiving this medication. This medication may increase the side effects of 5-fluorouracil. Tell your care team if you have diarrhea or mouth sores that do not get better or that get worse. What side effects may I notice from receiving this medication? Side effects that you should report to  your care team as soon as possible: Allergic reactions--skin rash, itching, hives, swelling of the face, lips, tongue, or throat This list may not describe all possible side effects. Call your doctor for medical advice about side effects. You may report side effects to FDA at 1-800-FDA-1088. Where should I keep my medication? This medication is given in a hospital or clinic. It will not be stored at home. NOTE: This sheet is a summary. It may not cover all possible information. If you have questions about this medicine, talk to your doctor, pharmacist, or health care provider.  2023 Elsevier/Gold Standard (2021-06-29 00:00:00) Irinotecan Injection What is this medication? IRINOTECAN (ir in oh TEE kan) treats some types of cancer. It works by slowing down the growth of cancer cells. This medicine may be used for other purposes; ask your health care provider or pharmacist if you have questions. COMMON BRAND NAME(S): Camptosar What should I tell my care team before I take this medication? They need to know if you have any of these conditions: Dehydration Diarrhea Infection, especially a viral infection, such as chickenpox, cold sores, herpes Liver disease Low blood cell levels (white cells, red cells, and platelets) Low levels of electrolytes, such as calcium, magnesium, or potassium  in your blood Recent or ongoing radiation An unusual or allergic reaction to irinotecan, other medications, foods, dyes, or preservatives If you or your partner are pregnant or trying to get pregnant Breast-feeding How should I use this medication? This medication is injected into a vein. It is given by your care team in a hospital or clinic setting. Talk to your care team about the use of this medication in children. Special care may be needed. Overdosage: If you think you have taken too much of this medicine contact a poison control center or emergency room at once. NOTE: This medicine is only for you. Do not  share this medicine with others. What if I miss a dose? Keep appointments for follow-up doses. It is important not to miss your dose. Call your care team if you are unable to keep an appointment. What may interact with this medication? Do not take this medication with any of the following: Cobicistat Itraconazole This medication may also interact with the following: Certain antibiotics, such as clarithromycin, rifampin, rifabutin Certain antivirals for HIV or AIDS Certain medications for fungal infections, such as ketoconazole, posaconazole, voriconazole Certain medications for seizures, such as carbamazepine, phenobarbital, phenytoin Gemfibrozil Nefazodone St. John's wort This list may not describe all possible interactions. Give your health care provider a list of all the medicines, herbs, non-prescription drugs, or dietary supplements you use. Also tell them if you smoke, drink alcohol, or use illegal drugs. Some items may interact with your medicine. What should I watch for while using this medication? Your condition will be monitored carefully while you are receiving this medication. You may need blood work while taking this medication. This medication may make you feel generally unwell. This is not uncommon as chemotherapy can affect healthy cells as well as cancer cells. Report any side effects. Continue your course of treatment even though you feel ill unless your care team tells you to stop. This medication can cause serious side effects. To reduce the risk, your care team may give you other medications to take before receiving this one. Be sure to follow the directions from your care team. This medication may affect your coordination, reaction time, or judgement. Do not drive or operate machinery until you know how this medication affects you. Sit up or stand slowly to reduce the risk of dizzy or fainting spells. Drinking alcohol with this medication can increase the risk of these side  effects. This medication may increase your risk of getting an infection. Call your care team for advice if you get a fever, chills, sore throat, or other symptoms of a cold or flu. Do not treat yourself. Try to avoid being around people who are sick. Avoid taking medications that contain aspirin, acetaminophen, ibuprofen, naproxen, or ketoprofen unless instructed by your care team. These medications may hide a fever. This medication may increase your risk to bruise or bleed. Call your care team if you notice any unusual bleeding. Be careful brushing or flossing your teeth or using a toothpick because you may get an infection or bleed more easily. If you have any dental work done, tell your dentist you are receiving this medication. Talk to your care team if you or your partner are pregnant or think either of you might be pregnant. This medication can cause serious birth defects if taken during pregnancy and for 6 months after the last dose. You will need a negative pregnancy test before starting this medication. Contraception is recommended while taking this medication and for 6  months after the last dose. Your care team can help you find the option that works for you. Do not father a child while taking this medication and for 3 months after the last dose. Use a condom for contraception during this time period. Do not breastfeed while taking this medication and for 7 days after the last dose. This medication may cause infertility. Talk to your care team if you are concerned about your fertility. What side effects may I notice from receiving this medication? Side effects that you should report to your care team as soon as possible: Allergic reactions--skin rash, itching, hives, swelling of the face, lips, tongue, or throat Dry cough, shortness of breath or trouble breathing Increased saliva or tears, increased sweating, stomach cramping, diarrhea, small pupils, unusual weakness or fatigue, slow  heartbeat Infection--fever, chills, cough, sore throat, wounds that don't heal, pain or trouble when passing urine, general feeling of discomfort or being unwell Kidney injury--decrease in the amount of urine, swelling of the ankles, hands, or feet Low red blood cell level--unusual weakness or fatigue, dizziness, headache, trouble breathing Severe or prolonged diarrhea Unusual bruising or bleeding Side effects that usually do not require medical attention (report to your care team if they continue or are bothersome): Constipation Diarrhea Hair loss Loss of appetite Nausea Stomach pain This list may not describe all possible side effects. Call your doctor for medical advice about side effects. You may report side effects to FDA at 1-800-FDA-1088. Where should I keep my medication? This medication is given in a hospital or clinic. It will not be stored at home. NOTE: This sheet is a summary. It may not cover all possible information. If you have questions about this medicine, talk to your doctor, pharmacist, or health care provider.  2023 Elsevier/Gold Standard (2021-06-03 00:00:00)  Panitumumab Injection What is this medication? PANITUMUMAB (pan i TOOM ue mab) treats colorectal cancer. It works by blocking a protein that causes cancer cells to grow and multiply. This helps to slow or stop the spread of cancer cells. It is a monoclonal antibody. This medicine may be used for other purposes; ask your health care provider or pharmacist if you have questions. COMMON BRAND NAME(S): Vectibix What should I tell my care team before I take this medication? They need to know if you have any of these conditions: Eye disease Low levels of magnesium in the blood Lung disease An unusual or allergic reaction to panitumumab, other medications, foods, dyes, or preservatives Pregnant or trying to get pregnant Breast-feeding How should I use this medication? This medication is injected into a vein. It  is given by your care team in a hospital or clinic setting. Talk to your care team about the use of this medication in children. Special care may be needed. Overdosage: If you think you have taken too much of this medicine contact a poison control center or emergency room at once. NOTE: This medicine is only for you. Do not share this medicine with others. What if I miss a dose? Keep appointments for follow-up doses. It is important not to miss your dose. Call your care team if you are unable to keep an appointment. What may interact with this medication? Bevacizumab This list may not describe all possible interactions. Give your health care provider a list of all the medicines, herbs, non-prescription drugs, or dietary supplements you use. Also tell them if you smoke, drink alcohol, or use illegal drugs. Some items may interact with your medicine. What should I  watch for while using this medication? Your condition will be monitored carefully while you are receiving this medication. This medication may make you feel generally unwell. This is not uncommon as chemotherapy can affect healthy cells as well as cancer cells. Report any side effects. Continue your course of treatment even though you feel ill unless your care team tells you to stop. This medication can make you more sensitive to the sun. Keep out of the sun while receiving this medication and for 2 months after stopping therapy. If you cannot avoid being in the sun, wear protective clothing and sunscreen. Do not use sun lamps, tanning beds, or tanning booths. Check with your care team if you have severe diarrhea, nausea, and vomiting or if you sweat a lot. The loss of too much body fluid may make it dangerous for you to take this medication. This medication may cause serious skin reactions. They can happen weeks to months after starting the medication. Contact your care team right away if you notice fevers or flu-like symptoms with a rash. The  rash may be red or purple and then turn into blisters or peeling of the skin. You may also notice a red rash with swelling of the face, lips, or lymph nodes in your neck or under your arms. Talk to your care team if you may be pregnant. Serious birth defects can occur if you take this medication during pregnancy and for 2 months after the last dose. Contraception is recommended while taking this medication and for 2 months after the last dose. Your care team can help you find the option that works for you. Do not breastfeed while taking this medication and for 2 months after the last dose. This medication may cause infertility. Talk to your care team if you are concerned about your fertility. What side effects may I notice from receiving this medication? Side effects that you should report to your care team as soon as possible: Allergic reactions--skin rash, itching, hives, swelling of the face, lips, tongue, or throat Dry cough, shortness of breath or trouble breathing Eye pain, redness, irritation, or discharge with blurry or decreased vision Infusion reactions--chest pain, shortness of breath or trouble breathing, feeling faint or lightheaded Low magnesium level--muscle pain or cramps, unusual weakness or fatigue, fast or irregular heartbeat, tremors Low potassium level--muscle pain or cramps, unusual weakness or fatigue, fast or irregular heartbeat, constipation Redness, blistering, peeling, or loosening of the skin, including inside the mouth Skin reactions on sun-exposed areas Side effects that usually do not require medical attention (report to your care team if they continue or are bothersome): Change in nail shape, thickness, or color Diarrhea Dry skin Fatigue Nausea Vomiting This list may not describe all possible side effects. Call your doctor for medical advice about side effects. You may report side effects to FDA at 1-800-FDA-1088. Where should I keep my medication? This  medication is given in a hospital or clinic. It will not be stored at home. NOTE: This sheet is a summary. It may not cover all possible information. If you have questions about this medicine, talk to your doctor, pharmacist, or health care provider.  2023 Elsevier/Gold Standard (2021-06-09 00:00:00)       To help prevent nausea and vomiting after your treatment, we encourage you to take your nausea medication as directed.  BELOW ARE SYMPTOMS THAT SHOULD BE REPORTED IMMEDIATELY: *FEVER GREATER THAN 100.4 F (38 C) OR HIGHER *CHILLS OR SWEATING *NAUSEA AND VOMITING THAT IS NOT CONTROLLED WITH  YOUR NAUSEA MEDICATION *UNUSUAL SHORTNESS OF BREATH *UNUSUAL BRUISING OR BLEEDING *URINARY PROBLEMS (pain or burning when urinating, or frequent urination) *BOWEL PROBLEMS (unusual diarrhea, constipation, pain near the anus) TENDERNESS IN MOUTH AND THROAT WITH OR WITHOUT PRESENCE OF ULCERS (sore throat, sores in mouth, or a toothache) UNUSUAL RASH, SWELLING OR PAIN  UNUSUAL VAGINAL DISCHARGE OR ITCHING   Items with * indicate a potential emergency and should be followed up as soon as possible or go to the Emergency Department if any problems should occur.  Please show the CHEMOTHERAPY ALERT CARD or IMMUNOTHERAPY ALERT CARD at check-in to the Emergency Department and triage nurse.  Should you have questions after your visit or need to cancel or reschedule your appointment, please contact [facility] [phone]  and follow the prompts.  Office hours are 8:00 a.m. to 4:30 p.m. Monday - Friday. Please note that voicemails left after 4:00 p.m. may not be returned until the following business day.  We are closed weekends and major holidays. You have access to a nurse at all times for urgent questions. Please call the main number to the clinic [phone] and follow the prompts.  For any non-urgent questions, you may also contact your provider using MyChart. We now offer e-Visits for  anyone 64 and older to request care online for non-urgent symptoms. For details visit [email or web address].   Also download the MyChart app! Go to the app store, search "MyChart", open the app, select [facility], and log in with your MyChart username and password.  Masks are optional in the cancer centers. If you would like for your care team to wear a mask while they are taking care of you, please let them know. You may have one support person who is at least 51 years old accompany you for your appointments.

## 2021-12-16 ENCOUNTER — Other Ambulatory Visit: Payer: Self-pay

## 2021-12-16 ENCOUNTER — Inpatient Hospital Stay: Payer: Medicaid Other

## 2021-12-16 VITALS — BP 143/81 | HR 73 | Temp 98.7°F | Resp 18

## 2021-12-16 DIAGNOSIS — C189 Malignant neoplasm of colon, unspecified: Secondary | ICD-10-CM

## 2021-12-16 DIAGNOSIS — Z95828 Presence of other vascular implants and grafts: Secondary | ICD-10-CM

## 2021-12-16 DIAGNOSIS — Z5111 Encounter for antineoplastic chemotherapy: Secondary | ICD-10-CM | POA: Diagnosis not present

## 2021-12-16 MED ORDER — HEPARIN SOD (PORK) LOCK FLUSH 100 UNIT/ML IV SOLN
500.0000 [IU] | Freq: Once | INTRAVENOUS | Status: AC | PRN
  Administered 2021-12-16: 500 [IU]

## 2021-12-16 MED ORDER — SODIUM CHLORIDE 0.9% FLUSH
10.0000 mL | INTRAVENOUS | Status: DC | PRN
  Administered 2021-12-16: 10 mL

## 2021-12-16 MED ORDER — PEGFILGRASTIM-CBQV 6 MG/0.6ML ~~LOC~~ SOSY
6.0000 mg | PREFILLED_SYRINGE | Freq: Once | SUBCUTANEOUS | Status: AC
  Administered 2021-12-16: 6 mg via SUBCUTANEOUS
  Filled 2021-12-16: qty 0.6

## 2021-12-16 NOTE — Patient Instructions (Signed)
[facility]  Discharge Instructions: Thank you for choosing [facility] to provide your oncology and hematology care.  If you have a lab appointment with the [facility], please come in thru the Main Entrance and check in at the main information desk.  Wear comfortable clothing and clothing appropriate for easy access to any Portacath or PICC line.   We strive to give you quality time with your provider. You may need to reschedule your appointment if you arrive late (15 or more minutes).  Arriving late affects you and other patients whose appointments are after yours.  Also, if you miss three or more appointments without notifying the office, you may be dismissed from the clinic at the provider's discretion.      For prescription refill requests, have your pharmacy contact our office and allow 72 hours for refills to be completed.    Today you received the following chemotherapy and/or immunotherapy agents Udenyca.  Pegfilgrastim Injection What is this medication? PEGFILGRASTIM (PEG fil gra stim) lowers the risk of infection in people who are receiving chemotherapy. It works by Building control surveyor make more white blood cells, which protects your body from infection. It may also be used to help people who have been exposed to high doses of radiation. This medicine may be used for other purposes; ask your health care provider or pharmacist if you have questions. COMMON BRAND NAME(S): [employer], Neulasta, Nyvepria, Stimufend, UDENYCA, Ziextenzo What should I tell my care team before I take this medication? They need to know if you have any of these conditions: Kidney disease Latex allergy Ongoing radiation therapy Sickle cell disease Skin reactions to acrylic adhesives (On-Body Injector only) An unusual or allergic reaction to pegfilgrastim, filgrastim, other medications, foods, dyes, or preservatives Pregnant or trying to get  pregnant Breast-feeding How should I use this medication? This medication is for injection under the skin. If you get this medication at home, you will be taught how to prepare and give the pre-filled syringe or how to use the On-body Injector. Refer to the patient Instructions for Use for detailed instructions. Use exactly as directed. Tell your care team immediately if you suspect that the On-body Injector may not have performed as intended or if you suspect the use of the On-body Injector resulted in a missed or partial dose. It is important that you put your used needles and syringes in a special sharps container. Do not put them in a trash can. If you do not have a sharps container, call your pharmacist or care team to get one. Talk to your care team about the use of this medication in children. While this medication may be prescribed for selected conditions, precautions do apply. Overdosage: If you think you have taken too much of this medicine contact a poison control center or emergency room at once. NOTE: This medicine is only for you. Do not share this medicine with others. What if I miss a dose? It is important not to miss your dose. Call your care team if you miss your dose. If you miss a dose due to an On-body Injector failure or leakage, a new dose should be administered as soon as possible using a single prefilled syringe for manual use. What may interact with this medication? Interactions have not been studied. This list may not describe all possible interactions. Give your health care provider a list of all the medicines, herbs, non-prescription drugs, or dietary supplements you use. Also tell  them if you smoke, drink alcohol, or use illegal drugs. Some items may interact with your medicine. What should I watch for while using this medication? Your condition will be monitored carefully while you are receiving this medication. You may need blood work done while you are taking this  medication. Talk to your care team about your risk of cancer. You may be more at risk for certain types of cancer if you take this medication. If you are going to need a MRI, CT scan, or other procedure, tell your care team that you are using this medication (On-Body Injector only). What side effects may I notice from receiving this medication? Side effects that you should report to your care team as soon as possible: Allergic reactions--skin rash, itching, hives, swelling of the face, lips, tongue, or throat Capillary leak syndrome--stomach or muscle pain, unusual weakness or fatigue, feeling faint or lightheaded, decrease in the amount of urine, swelling of the ankles, hands, or feet, trouble breathing High white blood cell level--fever, fatigue, trouble breathing, night sweats, change in vision, weight loss Inflammation of the aorta--fever, fatigue, back, chest, or stomach pain, severe headache Kidney injury (glomerulonephritis)--decrease in the amount of urine, red or dark Bentleigh Stankus urine, foamy or bubbly urine, swelling of the ankles, hands, or feet Shortness of breath or trouble breathing Spleen injury--pain in upper left stomach or shoulder Unusual bruising or bleeding Side effects that usually do not require medical attention (report to your care team if they continue or are bothersome): Bone pain Pain in the hands or feet This list may not describe all possible side effects. Call your doctor for medical advice about side effects. You may report side effects to FDA at 1-800-FDA-1088. Where should I keep my medication? Keep out of the reach of children. If you are using this medication at home, you will be instructed on how to store it. Throw away any unused medication after the expiration date on the label. NOTE: This sheet is a summary. It may not cover all possible information. If you have questions about this medicine, talk to your doctor, pharmacist, or health care provider.  2023  Elsevier/Gold Standard (2020-08-13 00:00:00)        To help prevent nausea and vomiting after your treatment, we encourage you to take your nausea medication as directed.  BELOW ARE SYMPTOMS THAT SHOULD BE REPORTED IMMEDIATELY: *FEVER GREATER THAN 100.4 F (38 C) OR HIGHER *CHILLS OR SWEATING *NAUSEA AND VOMITING THAT IS NOT CONTROLLED WITH YOUR NAUSEA MEDICATION *UNUSUAL SHORTNESS OF BREATH *UNUSUAL BRUISING OR BLEEDING *URINARY PROBLEMS (pain or burning when urinating, or frequent urination) *BOWEL PROBLEMS (unusual diarrhea, constipation, pain near the anus) TENDERNESS IN MOUTH AND THROAT WITH OR WITHOUT PRESENCE OF ULCERS (sore throat, sores in mouth, or a toothache) UNUSUAL RASH, SWELLING OR PAIN  UNUSUAL VAGINAL DISCHARGE OR ITCHING   Items with * indicate a potential emergency and should be followed up as soon as possible or go to the Emergency Department if any problems should occur.  Please show the CHEMOTHERAPY ALERT CARD or IMMUNOTHERAPY ALERT CARD at check-in to the Emergency Department and triage nurse.  Should you have questions after your visit or need to cancel or reschedule your appointment, please contact [facility] [phone]  and follow the prompts.  Office hours are 8:00 a.m. to 4:30 p.m. Monday - Friday. Please note that voicemails left after 4:00 p.m. may not be returned until the following business day.  We are closed weekends and major  holidays. You have access to a nurse at all times for urgent questions. Please call the main number to the clinic [phone] and follow the prompts.  For any non-urgent questions, you may also contact your provider using MyChart. We now offer e-Visits for anyone 76 and older to request care online for non-urgent symptoms. For details visit [email or web address].   Also download the MyChart app! Go to the app store, search "MyChart", open the app, select [facility], and log in with your MyChart username and  password.  Masks are optional in the cancer centers. If you would like for your care team to wear a mask while they are taking care of you, please let them know. You may have one support person who is at least 51 years old accompany you for your appointments.

## 2021-12-16 NOTE — Progress Notes (Signed)
Patients port flushed without difficulty.  Good blood return noted with no bruising or swelling noted at site.  Band aid applied.  Home infusion 5FU pump disconnected.  Patient tolerated Udenyca injection with no complaints voiced.  Site clean and dry with no bruising or swelling noted.  No complaints of pain.  Discharged with vital signs stable and no signs or symptoms of distress noted.

## 2021-12-21 ENCOUNTER — Inpatient Hospital Stay: Payer: Medicaid Other | Admitting: Licensed Clinical Social Worker

## 2021-12-21 DIAGNOSIS — C189 Malignant neoplasm of colon, unspecified: Secondary | ICD-10-CM

## 2021-12-21 NOTE — Progress Notes (Signed)
[facility] CSW Progress Note  Clinical Education officer, museum contacted patient by phone to discuss financial concerns.  Pt had applied for disability at the beginning of treatment and was denied, but has since been approved for SSI.  CSW scanned a referral signature page to patient and will resubmit a referral for disability as pt will have been out of work for a year on 12/01 due to her diagnosis.  CSW informed pt that if she receives a shut off notice from the electric company she should reach out to the [employer] as they will assist w/ the bill if a shut off notice has been received.  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  CSW reached out to [employer] and they have already applied $350 towards pt's electric bill and are adopting pt's children for Christmas gifts.  CSW to continue to provide support as appropriate throughout duration of treatment.      Henriette Combs, LCSW    Patient is participating in a Managed Medicaid Plan:  Yes

## 2021-12-27 ENCOUNTER — Other Ambulatory Visit: Payer: Medicaid Other

## 2021-12-27 ENCOUNTER — Ambulatory Visit: Payer: Medicaid Other | Admitting: Hematology

## 2021-12-27 ENCOUNTER — Ambulatory Visit: Payer: Medicaid Other

## 2021-12-29 ENCOUNTER — Inpatient Hospital Stay: Payer: Medicaid Other | Admitting: Licensed Clinical Social Worker

## 2021-12-29 DIAGNOSIS — C189 Malignant neoplasm of colon, unspecified: Secondary | ICD-10-CM

## 2021-12-29 NOTE — Progress Notes (Signed)
[facility] CSW Progress Note  Holiday representative  received signed referral for the [employer].    CSW submitted referral to the [facility] on behalf of pt.  CSW to remain available as appropriate to provide support throughout duration of treatment.      Henriette Combs, LCSW    Patient is participating in a Managed Medicaid Plan:  Yes

## 2022-01-03 ENCOUNTER — Ambulatory Visit: Payer: Medicaid Other | Admitting: Hematology

## 2022-01-03 ENCOUNTER — Inpatient Hospital Stay: Payer: Medicaid Other

## 2022-01-03 VITALS — BP 156/78 | HR 86 | Temp 98.7°F | Resp 18

## 2022-01-03 DIAGNOSIS — Z5111 Encounter for antineoplastic chemotherapy: Secondary | ICD-10-CM | POA: Diagnosis not present

## 2022-01-03 DIAGNOSIS — Z95828 Presence of other vascular implants and grafts: Secondary | ICD-10-CM

## 2022-01-03 DIAGNOSIS — C189 Malignant neoplasm of colon, unspecified: Secondary | ICD-10-CM

## 2022-01-03 LAB — CBC WITH DIFFERENTIAL/PLATELET
Abs Immature Granulocytes: 0.03 10*3/uL (ref 0.00–0.07)
Basophils Absolute: 0 10*3/uL (ref 0.0–0.1)
Basophils Relative: 1 %
Eosinophils Absolute: 0.2 10*3/uL (ref 0.0–0.5)
Eosinophils Relative: 3 %
HCT: 31.1 % — ABNORMAL LOW (ref 36.0–46.0)
Hemoglobin: 10.5 g/dL — ABNORMAL LOW (ref 12.0–15.0)
Immature Granulocytes: 1 %
Lymphocytes Relative: 37 %
Lymphs Abs: 1.8 10*3/uL (ref 0.7–4.0)
MCH: 32.8 pg (ref 26.0–34.0)
MCHC: 33.8 g/dL (ref 30.0–36.0)
MCV: 97.2 fL (ref 80.0–100.0)
Monocytes Absolute: 0.6 10*3/uL (ref 0.1–1.0)
Monocytes Relative: 13 %
Neutro Abs: 2.2 10*3/uL (ref 1.7–7.7)
Neutrophils Relative %: 45 %
Platelets: 230 10*3/uL (ref 150–400)
RBC: 3.2 MIL/uL — ABNORMAL LOW (ref 3.87–5.11)
RDW: 16.2 % — ABNORMAL HIGH (ref 11.5–15.5)
WBC: 4.9 10*3/uL (ref 4.0–10.5)
nRBC: 0 % (ref 0.0–0.2)

## 2022-01-03 LAB — COMPREHENSIVE METABOLIC PANEL
ALT: 20 U/L (ref 0–44)
AST: 23 U/L (ref 15–41)
Albumin: 3.4 g/dL — ABNORMAL LOW (ref 3.5–5.0)
Alkaline Phosphatase: 62 U/L (ref 38–126)
Anion gap: 6 (ref 5–15)
BUN: 8 mg/dL (ref 6–20)
CO2: 20 mmol/L — ABNORMAL LOW (ref 22–32)
Calcium: 8.5 mg/dL — ABNORMAL LOW (ref 8.9–10.3)
Chloride: 113 mmol/L — ABNORMAL HIGH (ref 98–111)
Creatinine, Ser: 0.65 mg/dL (ref 0.44–1.00)
GFR, Estimated: >60 mL/min (ref >60)
Glucose, Bld: 127 mg/dL — ABNORMAL HIGH (ref 70–99)
Potassium: 3.4 mmol/L — ABNORMAL LOW (ref 3.5–5.1)
Sodium: 139 mmol/L (ref 135–145)
Total Bilirubin: 0.4 mg/dL (ref 0.3–1.2)
Total Protein: 6.4 g/dL — ABNORMAL LOW (ref 6.5–8.1)

## 2022-01-03 LAB — MAGNESIUM: Magnesium: 1.7 mg/dL (ref 1.7–2.4)

## 2022-01-03 MED ORDER — ATROPINE SULFATE 1 MG/ML IV SOLN
0.5000 mg | Freq: Once | INTRAVENOUS | Status: AC
  Administered 2022-01-03: 0.5 mg via INTRAVENOUS
  Filled 2022-01-03: qty 1

## 2022-01-03 MED ORDER — SODIUM CHLORIDE 0.9 % IV SOLN
2400.0000 mg/m2 | INTRAVENOUS | Status: DC
  Administered 2022-01-03: 4000 mg via INTRAVENOUS
  Filled 2022-01-03: qty 80

## 2022-01-03 MED ORDER — SODIUM CHLORIDE 0.9 % IV SOLN
10.0000 mg | Freq: Once | INTRAVENOUS | Status: AC
  Administered 2022-01-03: 10 mg via INTRAVENOUS
  Filled 2022-01-03: qty 10

## 2022-01-03 MED ORDER — SODIUM CHLORIDE 0.9 % IV SOLN: Freq: Once | INTRAVENOUS | Status: AC

## 2022-01-03 MED ORDER — SODIUM CHLORIDE 0.9 % IV SOLN
165.0000 mg/m2 | Freq: Once | INTRAVENOUS | Status: AC
  Administered 2022-01-03: 280 mg via INTRAVENOUS
  Filled 2022-01-03: qty 14

## 2022-01-03 MED ORDER — SODIUM CHLORIDE 0.9 % IV SOLN
200.0000 mg/m2 | Freq: Once | INTRAVENOUS | Status: AC
  Administered 2022-01-03: 332 mg via INTRAVENOUS
  Filled 2022-01-03: qty 16.6

## 2022-01-03 MED ORDER — PALONOSETRON HCL INJECTION 0.25 MG/5ML
0.2500 mg | Freq: Once | INTRAVENOUS | Status: AC
  Administered 2022-01-03: 0.25 mg via INTRAVENOUS
  Filled 2022-01-03: qty 5

## 2022-01-03 MED ORDER — SODIUM CHLORIDE 0.9 % IV SOLN
150.0000 mg | Freq: Once | INTRAVENOUS | Status: AC
  Administered 2022-01-03: 150 mg via INTRAVENOUS
  Filled 2022-01-03: qty 5

## 2022-01-03 MED ORDER — SODIUM CHLORIDE 0.9 % IV SOLN
165.0000 mg/m2 | Freq: Once | INTRAVENOUS | Status: DC
  Filled 2022-01-03: qty 14

## 2022-01-03 MED ORDER — SODIUM CHLORIDE 0.9 % IV SOLN
6.0000 mg/kg | Freq: Once | INTRAVENOUS | Status: AC
  Administered 2022-01-03: 400 mg via INTRAVENOUS
  Filled 2022-01-03: qty 20

## 2022-01-03 NOTE — Patient Instructions (Signed)
[facility]  Discharge Instructions: Thank you for choosing [facility] to provide your oncology and hematology care.  If you have a lab appointment with the [facility], please come in thru the Main Entrance and check in at the main information desk.  Wear comfortable clothing and clothing appropriate for easy access to any Portacath or PICC line.   We strive to give you quality time with your provider. You may need to reschedule your appointment if you arrive late (15 or more minutes).  Arriving late affects you and other patients whose appointments are after yours.  Also, if you miss three or more appointments without notifying the office, you may be dismissed from the clinic at the provider's discretion.      For prescription refill requests, have your pharmacy contact our office and allow 72 hours for refills to be completed.    Today you received the following chemotherapy and/or immunotherapy agents Vectibix/Leucovorin/Irinotecan/Fluorouracil.  Fluorouracil Injection What is this medication? FLUOROURACIL (flure oh YOOR a sil) treats some types of cancer. It works by slowing down the growth of cancer cells. This medicine may be used for other purposes; ask your health care provider or pharmacist if you have questions. COMMON BRAND NAME(S): Adrucil What should I tell my care team before I take this medication? They need to know if you have any of these conditions: Blood disorders Dihydropyrimidine dehydrogenase (DPD) deficiency Infection, such as chickenpox, cold sores, herpes Kidney disease Liver disease Poor nutrition Recent or ongoing radiation therapy An unusual or allergic reaction to fluorouracil, other medications, foods, dyes, or preservatives If you or your partner are pregnant or trying to get pregnant Breast-feeding How should I use this medication? This medication is injected into a vein. It is administered by your care team in a  hospital or clinic setting. Talk to your care team about the use of this medication in children. Special care may be needed. Overdosage: If you think you have taken too much of this medicine contact a poison control center or emergency room at once. NOTE: This medicine is only for you. Do not share this medicine with others. What if I miss a dose? Keep appointments for follow-up doses. It is important not to miss your dose. Call your care team if you are unable to keep an appointment. What may interact with this medication? Do not take this medication with any of the following: Live virus vaccines This medication may also interact with the following: Medications that treat or prevent blood clots, such as warfarin, enoxaparin, dalteparin This list may not describe all possible interactions. Give your health care provider a list of all the medicines, herbs, non-prescription drugs, or dietary supplements you use. Also tell them if you smoke, drink alcohol, or use illegal drugs. Some items may interact with your medicine. What should I watch for while using this medication? Your condition will be monitored carefully while you are receiving this medication. This medication may make you feel generally unwell. This is not uncommon as chemotherapy can affect healthy cells as well as cancer cells. Report any side effects. Continue your course of treatment even though you feel ill unless your care team tells you to stop. In some cases, you may be given additional medications to help with side effects. Follow all directions for their use. This medication may increase your risk of getting an infection. Call your care team for advice if you get a fever, chills, sore throat, or other symptoms  of a cold or flu. Do not treat yourself. Try to avoid being around people who are sick. This medication may increase your risk to bruise or bleed. Call your care team if you notice any unusual bleeding. Be careful brushing  or flossing your teeth or using a toothpick because you may get an infection or bleed more easily. If you have any dental work done, tell your dentist you are receiving this medication. Avoid taking medications that contain aspirin, acetaminophen, ibuprofen, naproxen, or ketoprofen unless instructed by your care team. These medications may hide a fever. Do not treat diarrhea with over the counter products. Contact your care team if you have diarrhea that lasts more than 2 days or if it is severe and watery. This medication can make you more sensitive to the sun. Keep out of the sun. If you cannot avoid being in the sun, wear protective clothing and sunscreen. Do not use sun lamps, tanning beds, or tanning booths. Talk to your care team if you or your partner wish to become pregnant or think you might be pregnant. This medication can cause serious birth defects if taken during pregnancy and for 3 months after the last dose. A reliable form of contraception is recommended while taking this medication and for 3 months after the last dose. Talk to your care team about effective forms of contraception. Do not father a child while taking this medication and for 3 months after the last dose. Use a condom while having sex during this time period. Do not breastfeed while taking this medication. This medication may cause infertility. Talk to your care team if you are concerned about your fertility. What side effects may I notice from receiving this medication? Side effects that you should report to your care team as soon as possible: Allergic reactions--skin rash, itching, hives, swelling of the face, lips, tongue, or throat Heart attack--pain or tightness in the chest, shoulders, arms, or jaw, nausea, shortness of breath, cold or clammy skin, feeling faint or lightheaded Heart failure--shortness of breath, swelling of the ankles, feet, or hands, sudden weight gain, unusual weakness or fatigue Heart rhythm  changes--fast or irregular heartbeat, dizziness, feeling faint or lightheaded, chest pain, trouble breathing High ammonia level--unusual weakness or fatigue, confusion, loss of appetite, nausea, vomiting, seizures Infection--fever, chills, cough, sore throat, wounds that don't heal, pain or trouble when passing urine, general feeling of discomfort or being unwell Low red blood cell level--unusual weakness or fatigue, dizziness, headache, trouble breathing Pain, tingling, or numbness in the hands or feet, muscle weakness, change in vision, confusion or trouble speaking, loss of balance or coordination, trouble walking, seizures Redness, swelling, and blistering of the skin over hands and feet Severe or prolonged diarrhea Unusual bruising or bleeding Side effects that usually do not require medical attention (report to your care team if they continue or are bothersome): Dry skin Headache Increased tears Nausea Pain, redness, or swelling with sores inside the mouth or throat Sensitivity to light Vomiting This list may not describe all possible side effects. Call your doctor for medical advice about side effects. You may report side effects to FDA at 1-800-FDA-1088. Where should I keep my medication? This medication is given in a hospital or clinic. It will not be stored at home. NOTE: This sheet is a summary. It may not cover all possible information. If you have questions about this medicine, talk to your doctor, pharmacist, or health care provider.  2023 Elsevier/Gold Standard (2021-05-25 00:00:00)    Irinotecan  Injection What is this medication? IRINOTECAN (ir in oh TEE kan) treats some types of cancer. It works by slowing down the growth of cancer cells. This medicine may be used for other purposes; ask your health care provider or pharmacist if you have questions. COMMON BRAND NAME(S): Camptosar What should I tell my care team before I take this medication? They need to know if you  have any of these conditions: Dehydration Diarrhea Infection, especially a viral infection, such as chickenpox, cold sores, herpes Liver disease Low blood cell levels (white cells, red cells, and platelets) Low levels of electrolytes, such as calcium, magnesium, or potassium in your blood Recent or ongoing radiation An unusual or allergic reaction to irinotecan, other medications, foods, dyes, or preservatives If you or your partner are pregnant or trying to get pregnant Breast-feeding How should I use this medication? This medication is injected into a vein. It is given by your care team in a hospital or clinic setting. Talk to your care team about the use of this medication in children. Special care may be needed. Overdosage: If you think you have taken too much of this medicine contact a poison control center or emergency room at once. NOTE: This medicine is only for you. Do not share this medicine with others. What if I miss a dose? Keep appointments for follow-up doses. It is important not to miss your dose. Call your care team if you are unable to keep an appointment. What may interact with this medication? Do not take this medication with any of the following: Cobicistat Itraconazole This medication may also interact with the following: Certain antibiotics, such as clarithromycin, rifampin, rifabutin Certain antivirals for HIV or AIDS Certain medications for fungal infections, such as ketoconazole, posaconazole, voriconazole Certain medications for seizures, such as carbamazepine, phenobarbital, phenytoin Gemfibrozil Nefazodone St. John's wort This list may not describe all possible interactions. Give your health care provider a list of all the medicines, herbs, non-prescription drugs, or dietary supplements you use. Also tell them if you smoke, drink alcohol, or use illegal drugs. Some items may interact with your medicine. What should I watch for while using this  medication? Your condition will be monitored carefully while you are receiving this medication. You may need blood work while taking this medication. This medication may make you feel generally unwell. This is not uncommon as chemotherapy can affect healthy cells as well as cancer cells. Report any side effects. Continue your course of treatment even though you feel ill unless your care team tells you to stop. This medication can cause serious side effects. To reduce the risk, your care team may give you other medications to take before receiving this one. Be sure to follow the directions from your care team. This medication may affect your coordination, reaction time, or judgement. Do not drive or operate machinery until you know how this medication affects you. Sit up or stand slowly to reduce the risk of dizzy or fainting spells. Drinking alcohol with this medication can increase the risk of these side effects. This medication may increase your risk of getting an infection. Call your care team for advice if you get a fever, chills, sore throat, or other symptoms of a cold or flu. Do not treat yourself. Try to avoid being around people who are sick. Avoid taking medications that contain aspirin, acetaminophen, ibuprofen, naproxen, or ketoprofen unless instructed by your care team. These medications may hide a fever. This medication may increase your risk to bruise or  bleed. Call your care team if you notice any unusual bleeding. Be careful brushing or flossing your teeth or using a toothpick because you may get an infection or bleed more easily. If you have any dental work done, tell your dentist you are receiving this medication. Talk to your care team if you or your partner are pregnant or think either of you might be pregnant. This medication can cause serious birth defects if taken during pregnancy and for 6 months after the last dose. You will need a negative pregnancy test before starting this  medication. Contraception is recommended while taking this medication and for 6 months after the last dose. Your care team can help you find the option that works for you. Do not father a child while taking this medication and for 3 months after the last dose. Use a condom for contraception during this time period. Do not breastfeed while taking this medication and for 7 days after the last dose. This medication may cause infertility. Talk to your care team if you are concerned about your fertility. What side effects may I notice from receiving this medication? Side effects that you should report to your care team as soon as possible: Allergic reactions--skin rash, itching, hives, swelling of the face, lips, tongue, or throat Dry cough, shortness of breath or trouble breathing Increased saliva or tears, increased sweating, stomach cramping, diarrhea, small pupils, unusual weakness or fatigue, slow heartbeat Infection--fever, chills, cough, sore throat, wounds that don't heal, pain or trouble when passing urine, general feeling of discomfort or being unwell Kidney injury--decrease in the amount of urine, swelling of the ankles, hands, or feet Low red blood cell level--unusual weakness or fatigue, dizziness, headache, trouble breathing Severe or prolonged diarrhea Unusual bruising or bleeding Side effects that usually do not require medical attention (report to your care team if they continue or are bothersome): Constipation Diarrhea Hair loss Loss of appetite Nausea Stomach pain This list may not describe all possible side effects. Call your doctor for medical advice about side effects. You may report side effects to FDA at 1-800-FDA-1088. Where should I keep my medication? This medication is given in a hospital or clinic. It will not be stored at home. NOTE: This sheet is a summary. It may not cover all possible information. If you have questions about this medicine, talk to your doctor,  pharmacist, or health care provider.  2023 Elsevier/Gold Standard (2021-06-03 00:00:00)    Leucovorin Injection What is this medication? LEUCOVORIN (loo koe VOR in) prevents side effects from certain medications, such as methotrexate. It works by increasing folate levels. This helps protect healthy cells in your body. It may also be used to treat anemia caused by low levels of folate. It can also be used with fluorouracil, a type of chemotherapy, to treat colorectal cancer. It works by increasing the effects of fluorouracil in the body. This medicine may be used for other purposes; ask your health care provider or pharmacist if you have questions. What should I tell my care team before I take this medication? They need to know if you have any of these conditions: Anemia from low levels of vitamin B12 in the blood An unusual or allergic reaction to leucovorin, folic acid, other medications, foods, dyes, or preservatives Pregnant or trying to get pregnant Breastfeeding How should I use this medication? This medication is injected into a vein or a muscle. It is given by your care team in a hospital or clinic setting. Talk to your  care team about the use of this medication in children. Special care may be needed. Overdosage: If you think you have taken too much of this medicine contact a poison control center or emergency room at once. NOTE: This medicine is only for you. Do not share this medicine with others. What if I miss a dose? Keep appointments for follow-up doses. It is important not to miss your dose. Call your care team if you are unable to keep an appointment. What may interact with this medication? Capecitabine Fluorouracil Phenobarbital Phenytoin Primidone Trimethoprim;sulfamethoxazole This list may not describe all possible interactions. Give your health care provider a list of all the medicines, herbs, non-prescription drugs, or dietary supplements you use. Also tell them if  you smoke, drink alcohol, or use illegal drugs. Some items may interact with your medicine. What should I watch for while using this medication? Your condition will be monitored carefully while you are receiving this medication. This medication may increase the side effects of 5-fluorouracil. Tell your care team if you have diarrhea or mouth sores that do not get better or that get worse. What side effects may I notice from receiving this medication? Side effects that you should report to your care team as soon as possible: Allergic reactions--skin rash, itching, hives, swelling of the face, lips, tongue, or throat This list may not describe all possible side effects. Call your doctor for medical advice about side effects. You may report side effects to FDA at 1-800-FDA-1088. Where should I keep my medication? This medication is given in a hospital or clinic. It will not be stored at home. NOTE: This sheet is a summary. It may not cover all possible information. If you have questions about this medicine, talk to your doctor, pharmacist, or health care provider.  2023 Elsevier/Gold Standard (2021-06-29 00:00:00)    Panitumumab Injection What is this medication? PANITUMUMAB (pan i TOOM ue mab) treats colorectal cancer. It works by blocking a protein that causes cancer cells to grow and multiply. This helps to slow or stop the spread of cancer cells. It is a monoclonal antibody. This medicine may be used for other purposes; ask your health care provider or pharmacist if you have questions. COMMON BRAND NAME(S): Vectibix What should I tell my care team before I take this medication? They need to know if you have any of these conditions: Eye disease Low levels of magnesium in the blood Lung disease An unusual or allergic reaction to panitumumab, other medications, foods, dyes, or preservatives Pregnant or trying to get pregnant Breast-feeding How should I use this medication? This medication  is injected into a vein. It is given by your care team in a hospital or clinic setting. Talk to your care team about the use of this medication in children. Special care may be needed. Overdosage: If you think you have taken too much of this medicine contact a poison control center or emergency room at once. NOTE: This medicine is only for you. Do not share this medicine with others. What if I miss a dose? Keep appointments for follow-up doses. It is important not to miss your dose. Call your care team if you are unable to keep an appointment. What may interact with this medication? Bevacizumab This list may not describe all possible interactions. Give your health care provider a list of all the medicines, herbs, non-prescription drugs, or dietary supplements you use. Also tell them if you smoke, drink alcohol, or use illegal drugs. Some items may interact with  your medicine. What should I watch for while using this medication? Your condition will be monitored carefully while you are receiving this medication. This medication may make you feel generally unwell. This is not uncommon as chemotherapy can affect healthy cells as well as cancer cells. Report any side effects. Continue your course of treatment even though you feel ill unless your care team tells you to stop. This medication can make you more sensitive to the sun. Keep out of the sun while receiving this medication and for 2 months after stopping therapy. If you cannot avoid being in the sun, wear protective clothing and sunscreen. Do not use sun lamps, tanning beds, or tanning booths. Check with your care team if you have severe diarrhea, nausea, and vomiting or if you sweat a lot. The loss of too much body fluid may make it dangerous for you to take this medication. This medication may cause serious skin reactions. They can happen weeks to months after starting the medication. Contact your care team right away if you notice fevers or  flu-like symptoms with a rash. The rash may be red or purple and then turn into blisters or peeling of the skin. You may also notice a red rash with swelling of the face, lips, or lymph nodes in your neck or under your arms. Talk to your care team if you may be pregnant. Serious birth defects can occur if you take this medication during pregnancy and for 2 months after the last dose. Contraception is recommended while taking this medication and for 2 months after the last dose. Your care team can help you find the option that works for you. Do not breastfeed while taking this medication and for 2 months after the last dose. This medication may cause infertility. Talk to your care team if you are concerned about your fertility. What side effects may I notice from receiving this medication? Side effects that you should report to your care team as soon as possible: Allergic reactions--skin rash, itching, hives, swelling of the face, lips, tongue, or throat Dry cough, shortness of breath or trouble breathing Eye pain, redness, irritation, or discharge with blurry or decreased vision Infusion reactions--chest pain, shortness of breath or trouble breathing, feeling faint or lightheaded Low magnesium level--muscle pain or cramps, unusual weakness or fatigue, fast or irregular heartbeat, tremors Low potassium level--muscle pain or cramps, unusual weakness or fatigue, fast or irregular heartbeat, constipation Redness, blistering, peeling, or loosening of the skin, including inside the mouth Skin reactions on sun-exposed areas Side effects that usually do not require medical attention (report to your care team if they continue or are bothersome): Change in nail shape, thickness, or color Diarrhea Dry skin Fatigue Nausea Vomiting This list may not describe all possible side effects. Call your doctor for medical advice about side effects. You may report side effects to FDA at 1-800-FDA-1088. Where should I  keep my medication? This medication is given in a hospital or clinic. It will not be stored at home. NOTE: This sheet is a summary. It may not cover all possible information. If you have questions about this medicine, talk to your doctor, pharmacist, or health care provider.  2023 Elsevier/Gold Standard (2021-06-09 00:00:00)        To help prevent nausea and vomiting after your treatment, we encourage you to take your nausea medication as directed.  BELOW ARE SYMPTOMS THAT SHOULD BE REPORTED IMMEDIATELY: *FEVER GREATER THAN 100.4 F (38 C) OR HIGHER *CHILLS OR SWEATING *NAUSEA AND  VOMITING THAT IS NOT CONTROLLED WITH YOUR NAUSEA MEDICATION *UNUSUAL SHORTNESS OF BREATH *UNUSUAL BRUISING OR BLEEDING *URINARY PROBLEMS (pain or burning when urinating, or frequent urination) *BOWEL PROBLEMS (unusual diarrhea, constipation, pain near the anus) TENDERNESS IN MOUTH AND THROAT WITH OR WITHOUT PRESENCE OF ULCERS (sore throat, sores in mouth, or a toothache) UNUSUAL RASH, SWELLING OR PAIN  UNUSUAL VAGINAL DISCHARGE OR ITCHING   Items with * indicate a potential emergency and should be followed up as soon as possible or go to the Emergency Department if any problems should occur.  Please show the CHEMOTHERAPY ALERT CARD or IMMUNOTHERAPY ALERT CARD at check-in to the Emergency Department and triage nurse.  Should you have questions after your visit or need to cancel or reschedule your appointment, please contact [facility] [phone]  and follow the prompts.  Office hours are 8:00 a.m. to 4:30 p.m. Monday - Friday. Please note that voicemails left after 4:00 p.m. may not be returned until the following business day.  We are closed weekends and major holidays. You have access to a nurse at all times for urgent questions. Please call the main number to the clinic [phone] and follow the prompts.  For any non-urgent questions, you may also contact your provider using MyChart.  We now offer e-Visits for anyone 3 and older to request care online for non-urgent symptoms. For details visit [email or web address].   Also download the MyChart app! Go to the app store, search "MyChart", open the app, select [facility], and log in with your MyChart username and password.  Masks are optional in the cancer centers. If you would like for your care team to wear a mask while they are taking care of you, please let them know. You may have one support person who is at least 51 years old accompany you for your appointments.

## 2022-01-03 NOTE — Progress Notes (Signed)
Patient presents today for chemotherapy infusion.  Patient is in satisfactory condition with no complaints voiced.  Vital signs are stable.  Labs reviewed and all labs are within treatment parameters.  We will proceed with treatment per MD orders.   Patient tolerated treatment well with no complaints voiced.  Home infusion 5FU pump connected with no difficulty.  Patient left ambulatory in stable condition.  Vital signs stable at discharge.  Follow up as scheduled.

## 2022-01-04 ENCOUNTER — Other Ambulatory Visit: Payer: Self-pay

## 2022-01-04 LAB — CEA: CEA: 7.6 ng/mL — ABNORMAL HIGH (ref 0.0–4.7)

## 2022-01-05 ENCOUNTER — Inpatient Hospital Stay: Payer: Medicaid Other

## 2022-01-05 VITALS — BP 134/82 | HR 94 | Temp 97.6°F | Resp 18

## 2022-01-05 DIAGNOSIS — Z95828 Presence of other vascular implants and grafts: Secondary | ICD-10-CM

## 2022-01-05 DIAGNOSIS — C189 Malignant neoplasm of colon, unspecified: Secondary | ICD-10-CM

## 2022-01-05 DIAGNOSIS — Z5111 Encounter for antineoplastic chemotherapy: Secondary | ICD-10-CM | POA: Diagnosis not present

## 2022-01-05 MED ORDER — HEPARIN SOD (PORK) LOCK FLUSH 100 UNIT/ML IV SOLN
500.0000 [IU] | Freq: Once | INTRAVENOUS | Status: AC | PRN
  Administered 2022-01-05: 500 [IU]

## 2022-01-05 MED ORDER — SODIUM CHLORIDE 0.9% FLUSH
10.0000 mL | INTRAVENOUS | Status: DC | PRN
  Administered 2022-01-05: 10 mL

## 2022-01-05 MED ORDER — PEGFILGRASTIM-CBQV 6 MG/0.6ML ~~LOC~~ SOSY
6.0000 mg | PREFILLED_SYRINGE | Freq: Once | SUBCUTANEOUS | Status: AC
  Administered 2022-01-05: 6 mg via SUBCUTANEOUS
  Filled 2022-01-05: qty 0.6

## 2022-01-05 NOTE — Patient Instructions (Signed)
[facility]  Discharge Instructions: Thank you for choosing [facility] to provide your oncology and hematology care.  If you have a lab appointment with the [facility], please come in thru the Main Entrance and check in at the main information desk.  Wear comfortable clothing and clothing appropriate for easy access to any Portacath or PICC line.   We strive to give you quality time with your provider. You may need to reschedule your appointment if you arrive late (15 or more minutes).  Arriving late affects you and other patients whose appointments are after yours.  Also, if you miss three or more appointments without notifying the office, you may be dismissed from the clinic at the provider's discretion.      For prescription refill requests, have your pharmacy contact our office and allow 72 hours for refills to be completed.    Today you received the following chemotherapy and/or immunotherapy agents 5FU. Patient presents today for pump d/c. Vital signs are stable. Port a cath site clean, dry, and intact. Port flushed with 10 mls of Normal Saline and 500 Units of Heparin. Needle removed intact. Band aid applied. Patient has no complaints at this time. Discharged from clinic ambulatory and in stable condition. Patient alert and oriented.       To help prevent nausea and vomiting after your treatment, we encourage you to take your nausea medication as directed.  BELOW ARE SYMPTOMS THAT SHOULD BE REPORTED IMMEDIATELY: *FEVER GREATER THAN 100.4 F (38 C) OR HIGHER *CHILLS OR SWEATING *NAUSEA AND VOMITING THAT IS NOT CONTROLLED WITH YOUR NAUSEA MEDICATION *UNUSUAL SHORTNESS OF BREATH *UNUSUAL BRUISING OR BLEEDING *URINARY PROBLEMS (pain or burning when urinating, or frequent urination) *BOWEL PROBLEMS (unusual diarrhea, constipation, pain near the anus) TENDERNESS IN MOUTH AND THROAT WITH OR WITHOUT PRESENCE OF ULCERS (sore throat, sores in mouth, or a  toothache) UNUSUAL RASH, SWELLING OR PAIN  UNUSUAL VAGINAL DISCHARGE OR ITCHING   Items with * indicate a potential emergency and should be followed up as soon as possible or go to the Emergency Department if any problems should occur.  Please show the CHEMOTHERAPY ALERT CARD or IMMUNOTHERAPY ALERT CARD at check-in to the Emergency Department and triage nurse.  Should you have questions after your visit or need to cancel or reschedule your appointment, please contact [facility] [phone]  and follow the prompts.  Office hours are 8:00 a.m. to 4:30 p.m. Monday - Friday. Please note that voicemails left after 4:00 p.m. may not be returned until the following business day.  We are closed weekends and major holidays. You have access to a nurse at all times for urgent questions. Please call the main number to the clinic [phone] and follow the prompts.  For any non-urgent questions, you may also contact your provider using MyChart. We now offer e-Visits for anyone 60 and older to request care online for non-urgent symptoms. For details visit [email or web address].   Also download the MyChart app! Go to the app store, search "MyChart", open the app, select [facility], and log in with your MyChart username and password.  Masks are optional in the cancer centers. If you would like for your care team to wear a mask while they are taking care of you, please let them know. You may have one support person who is at least 51 years old accompany you for your appointments.

## 2022-01-05 NOTE — Progress Notes (Signed)
Patient presents today for pump d/c. Vital signs are stable. Port a cath site clean, dry, and intact. Port flushed with 10 mls of Normal Saline and 500 Units of Heparin. Needle removed intact. Band aid applied. Patient has no complaints at this time. Discharged from clinic ambulatory and in stable condition. Patient alert and oriented.

## 2022-01-07 ENCOUNTER — Inpatient Hospital Stay: Payer: Medicaid Other | Attending: Hematology | Admitting: Licensed Clinical Social Worker

## 2022-01-07 DIAGNOSIS — R21 Rash and other nonspecific skin eruption: Secondary | ICD-10-CM | POA: Insufficient documentation

## 2022-01-07 DIAGNOSIS — D509 Iron deficiency anemia, unspecified: Secondary | ICD-10-CM | POA: Insufficient documentation

## 2022-01-07 DIAGNOSIS — C187 Malignant neoplasm of sigmoid colon: Secondary | ICD-10-CM | POA: Insufficient documentation

## 2022-01-07 DIAGNOSIS — Z793 Long term (current) use of hormonal contraceptives: Secondary | ICD-10-CM | POA: Insufficient documentation

## 2022-01-07 DIAGNOSIS — G629 Polyneuropathy, unspecified: Secondary | ICD-10-CM | POA: Insufficient documentation

## 2022-01-07 DIAGNOSIS — I10 Essential (primary) hypertension: Secondary | ICD-10-CM | POA: Insufficient documentation

## 2022-01-07 DIAGNOSIS — Z5111 Encounter for antineoplastic chemotherapy: Secondary | ICD-10-CM | POA: Insufficient documentation

## 2022-01-07 DIAGNOSIS — E876 Hypokalemia: Secondary | ICD-10-CM | POA: Insufficient documentation

## 2022-01-07 DIAGNOSIS — C189 Malignant neoplasm of colon, unspecified: Secondary | ICD-10-CM

## 2022-01-07 DIAGNOSIS — Z79899 Other long term (current) drug therapy: Secondary | ICD-10-CM | POA: Insufficient documentation

## 2022-01-07 NOTE — Progress Notes (Signed)
[facility] CSW Progress Note  Holiday representative  received a message from pt informing that her electricity has been turned off.  Pt has exhausted Advertising account executive and [employer], as well as Public librarian from [employer].  Pt's application to re-apply for disability submitted this week, but it may take a few months to see financial relief if pt is approved.  Per pt her daughter moved in with her 2 children (ages 50 and 79) after her husband moved out to assist pt financially.  Pt rents and the rent is $700 per month.  Pt's daughter is not able to cover all expenses with her current income.  CSW reached out to the [employer], who informed CSW they are not able to assist pt once the power has been turned off.  CSW spoke with pt regarding a possible grant which could be applied for, but will take a few days to be reviewed.  CSW requested a picture of the front and back of the electric bill be sent in order for CSW to contact the electric company to verify how much would need to be paid to turn the electricity back on.  Pt verbalized agreement to do so.  CSW to apply for grant once a copy of the bill is received.      Henriette Combs, LCSW    Patient is participating in a Managed Medicaid Plan:  Yes

## 2022-01-10 ENCOUNTER — Inpatient Hospital Stay: Payer: Medicaid Other | Admitting: Licensed Clinical Social Worker

## 2022-01-10 NOTE — Progress Notes (Signed)
[facility] CSW Progress Note  Holiday representative  received a copy of the final notice from [employer] for pt.    CSW contacted [employer] to inquire about the total amount needed to reconnect pt's electricity.  Per [employer] now that the electricity has been turned off it would cost $1,730.64 to pay off the previous balance and $9.90 to pay for reconnection.  The [place] will cover only $1,000 which will not be enough to reconnect electricity.  CSW attempted to contact patient to inform of the above.  Pt did not answer and a voicemail was left.  CSW awaiting return call from pt determine next steps to move forward.    Henriette Combs, LCSW

## 2022-01-18 ENCOUNTER — Inpatient Hospital Stay: Payer: Medicaid Other

## 2022-01-18 ENCOUNTER — Inpatient Hospital Stay: Payer: Medicaid Other | Admitting: Hematology

## 2022-01-18 DIAGNOSIS — C7989 Secondary malignant neoplasm of other specified sites: Secondary | ICD-10-CM

## 2022-01-19 ENCOUNTER — Other Ambulatory Visit: Payer: Self-pay

## 2022-01-26 ENCOUNTER — Inpatient Hospital Stay (HOSPITAL_BASED_OUTPATIENT_CLINIC_OR_DEPARTMENT_OTHER): Payer: Medicaid Other | Admitting: Hematology

## 2022-01-26 ENCOUNTER — Inpatient Hospital Stay: Payer: Medicaid Other

## 2022-01-26 VITALS — BP 138/76 | HR 82 | Temp 99.1°F | Resp 18

## 2022-01-26 DIAGNOSIS — Z95828 Presence of other vascular implants and grafts: Secondary | ICD-10-CM

## 2022-01-26 DIAGNOSIS — Z793 Long term (current) use of hormonal contraceptives: Secondary | ICD-10-CM | POA: Diagnosis not present

## 2022-01-26 DIAGNOSIS — G629 Polyneuropathy, unspecified: Secondary | ICD-10-CM | POA: Diagnosis not present

## 2022-01-26 DIAGNOSIS — C189 Malignant neoplasm of colon, unspecified: Secondary | ICD-10-CM

## 2022-01-26 DIAGNOSIS — I10 Essential (primary) hypertension: Secondary | ICD-10-CM | POA: Diagnosis not present

## 2022-01-26 DIAGNOSIS — E876 Hypokalemia: Secondary | ICD-10-CM | POA: Diagnosis not present

## 2022-01-26 DIAGNOSIS — Z79899 Other long term (current) drug therapy: Secondary | ICD-10-CM | POA: Diagnosis not present

## 2022-01-26 DIAGNOSIS — D509 Iron deficiency anemia, unspecified: Secondary | ICD-10-CM | POA: Diagnosis not present

## 2022-01-26 DIAGNOSIS — R21 Rash and other nonspecific skin eruption: Secondary | ICD-10-CM | POA: Diagnosis not present

## 2022-01-26 DIAGNOSIS — Z5111 Encounter for antineoplastic chemotherapy: Secondary | ICD-10-CM | POA: Diagnosis present

## 2022-01-26 DIAGNOSIS — C187 Malignant neoplasm of sigmoid colon: Secondary | ICD-10-CM | POA: Diagnosis not present

## 2022-01-26 LAB — CBC WITH DIFFERENTIAL/PLATELET
Abs Immature Granulocytes: 0.01 10*3/uL (ref 0.00–0.07)
Basophils Absolute: 0 10*3/uL (ref 0.0–0.1)
Basophils Relative: 1 %
Eosinophils Absolute: 0.2 10*3/uL (ref 0.0–0.5)
Eosinophils Relative: 4 %
HCT: 33.1 % — ABNORMAL LOW (ref 36.0–46.0)
Hemoglobin: 11.1 g/dL — ABNORMAL LOW (ref 12.0–15.0)
Immature Granulocytes: 0 %
Lymphocytes Relative: 39 %
Lymphs Abs: 1.9 10*3/uL (ref 0.7–4.0)
MCH: 32.2 pg (ref 26.0–34.0)
MCHC: 33.5 g/dL (ref 30.0–36.0)
MCV: 95.9 fL (ref 80.0–100.0)
Monocytes Absolute: 0.6 10*3/uL (ref 0.1–1.0)
Monocytes Relative: 13 %
Neutro Abs: 2.2 10*3/uL (ref 1.7–7.7)
Neutrophils Relative %: 43 %
Platelets: 284 10*3/uL (ref 150–400)
RBC: 3.45 MIL/uL — ABNORMAL LOW (ref 3.87–5.11)
RDW: 14.5 % (ref 11.5–15.5)
WBC: 5 10*3/uL (ref 4.0–10.5)
nRBC: 0 % (ref 0.0–0.2)

## 2022-01-26 LAB — COMPREHENSIVE METABOLIC PANEL
ALT: 12 U/L (ref 0–44)
AST: 16 U/L (ref 15–41)
Albumin: 3.6 g/dL (ref 3.5–5.0)
Alkaline Phosphatase: 60 U/L (ref 38–126)
Anion gap: 6 (ref 5–15)
BUN: 8 mg/dL (ref 6–20)
CO2: 21 mmol/L — ABNORMAL LOW (ref 22–32)
Calcium: 8.9 mg/dL (ref 8.9–10.3)
Chloride: 114 mmol/L — ABNORMAL HIGH (ref 98–111)
Creatinine, Ser: 0.68 mg/dL (ref 0.44–1.00)
GFR, Estimated: >60 mL/min (ref >60)
Glucose, Bld: 102 mg/dL — ABNORMAL HIGH (ref 70–99)
Potassium: 3.8 mmol/L (ref 3.5–5.1)
Sodium: 141 mmol/L (ref 135–145)
Total Bilirubin: 0.5 mg/dL (ref 0.3–1.2)
Total Protein: 7 g/dL (ref 6.5–8.1)

## 2022-01-26 LAB — MAGNESIUM: Magnesium: 1.9 mg/dL (ref 1.7–2.4)

## 2022-01-26 MED ORDER — SODIUM CHLORIDE 0.9 % IV SOLN
165.0000 mg/m2 | Freq: Once | INTRAVENOUS | Status: AC
  Administered 2022-01-26: 280 mg via INTRAVENOUS
  Filled 2022-01-26: qty 14

## 2022-01-26 MED ORDER — SODIUM CHLORIDE 0.9 % IV SOLN
2400.0000 mg/m2 | INTRAVENOUS | Status: DC
  Administered 2022-01-26: 4000 mg via INTRAVENOUS
  Filled 2022-01-26: qty 80

## 2022-01-26 MED ORDER — SODIUM CHLORIDE 0.9 % IV SOLN
200.0000 mg/m2 | Freq: Once | INTRAVENOUS | Status: AC
  Administered 2022-01-26: 332 mg via INTRAVENOUS
  Filled 2022-01-26: qty 16.6

## 2022-01-26 MED ORDER — SODIUM CHLORIDE 0.9 % IV SOLN
165.0000 mg/m2 | Freq: Once | INTRAVENOUS | Status: DC
  Filled 2022-01-26: qty 14

## 2022-01-26 MED ORDER — SODIUM CHLORIDE 0.9 % IV SOLN
10.0000 mg | Freq: Once | INTRAVENOUS | Status: AC
  Administered 2022-01-26: 10 mg via INTRAVENOUS
  Filled 2022-01-26: qty 10

## 2022-01-26 MED ORDER — PALONOSETRON HCL INJECTION 0.25 MG/5ML
0.2500 mg | Freq: Once | INTRAVENOUS | Status: AC
  Administered 2022-01-26: 0.25 mg via INTRAVENOUS
  Filled 2022-01-26: qty 5

## 2022-01-26 MED ORDER — ATROPINE SULFATE 1 MG/ML IV SOLN
0.5000 mg | Freq: Once | INTRAVENOUS | Status: AC
  Administered 2022-01-26: 0.5 mg via INTRAVENOUS
  Filled 2022-01-26: qty 1

## 2022-01-26 MED ORDER — SODIUM CHLORIDE 0.9% FLUSH
10.0000 mL | Freq: Once | INTRAVENOUS | Status: AC
  Administered 2022-01-26: 10 mL via INTRAVENOUS

## 2022-01-26 MED ORDER — SODIUM CHLORIDE 0.9 % IV SOLN: Freq: Once | INTRAVENOUS | Status: AC

## 2022-01-26 MED ORDER — SODIUM CHLORIDE 0.9 % IV SOLN
150.0000 mg | Freq: Once | INTRAVENOUS | Status: AC
  Administered 2022-01-26: 150 mg via INTRAVENOUS
  Filled 2022-01-26: qty 150

## 2022-01-26 MED ORDER — SODIUM CHLORIDE 0.9 % IV SOLN
6.0000 mg/kg | Freq: Once | INTRAVENOUS | Status: AC
  Administered 2022-01-26: 400 mg via INTRAVENOUS
  Filled 2022-01-26: qty 20

## 2022-01-26 NOTE — Patient Instructions (Addendum)
[facility] at [facility] Discharge Instructions   You were seen and examined today by Dr. Delton Coombes.  He reviewed the results of your lab work which are normal/stable.   We will proceed with your treatment today.  Return as scheduled.    Thank you for choosing [facility] at [facility] to provide your oncology and hematology care.  To afford each patient quality time with our provider, please arrive at least 15 minutes before your scheduled appointment time.   If you have a lab appointment with the [facility] please come in thru the Main Entrance and check in at the main information desk.  You need to re-schedule your appointment should you arrive 10 or more minutes late.  We strive to give you quality time with our providers, and arriving late affects you and other patients whose appointments are after yours.  Also, if you no show three or more times for appointments you may be dismissed from the clinic at the providers discretion.     Again, thank you for choosing [facility].  Our hope is that these requests will decrease the amount of time that you wait before being seen by our physicians.       _____________________________________________________________  Should you have questions after your visit to [facility], please contact our office at [phone] and follow the prompts.  Our office hours are 8:00 a.m. and 4:30 p.m. Monday - Friday.  Please note that voicemails left after 4:00 p.m. may not be returned until the following business day.  We are closed weekends and major holidays.  You do have access to a nurse 24-7, just call the main number to the clinic [phone] and do not press any options, hold on the line and a nurse will answer the phone.    For prescription refill requests, have your pharmacy contact our office and allow 72 hours.    Due to Covid, you will need to wear a mask upon entering  the hospital. If you do not have a mask, a mask will be given to you at the Main Entrance upon arrival. For doctor visits, patients may have 1 support person age 20 or older with them. For treatment visits, patients can not have anyone with them due to social distancing guidelines and our immunocompromised population.

## 2022-01-26 NOTE — Progress Notes (Signed)
Patient has been examined by Dr. Katragadda, and vital signs and labs have been reviewed. ANC, Creatinine, LFTs, hemoglobin, and platelets are within treatment parameters per M.D. - pt may proceed with treatment.  Primary RN and pharmacy notified.  

## 2022-01-26 NOTE — Progress Notes (Signed)
Diana Fox, [place] [id number]   CLINIC:  Medical Oncology/Hematology  PCP:  Caryl Bis, MD [address] 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  PRIOR THERAPY: Sigmoid colon resection, partial small bowel resection and Hartman's procedure on 03/26/2021  NGS Results: KRAS/NRAS negative, BRAF negative, HER2 negative, MSI-stable  CURRENT THERAPY: FOLFOXIRI + Panitumumab q14d  BRIEF ONCOLOGIC HISTORY:  Oncology History  Colon carcinoma ([facility])  03/26/2021 Initial Diagnosis   Colon carcinoma ([facility])   04/06/2021 Cancer Staging   Staging form: Colon and Rectum, AJCC 8th Edition - Clinical stage from 04/06/2021: Stage IVC (cT4a, cN2b, pM1c) - Signed by Derek Jack, MD on 05/04/2021 Histopathologic type: Adenocarcinoma, NOS Stage prefix: Initial diagnosis Total positive nodes: 9 Total nodes examined: 9 Tumor deposits (TD): Present Carcinoembryonic antigen (CEA) (ng/mL): 61.5 Perineural invasion (PNI): Present Microsatellite instability (MSI): Stable KRAS mutation: Negative NRAS mutation: Negative BRAF mutation: Negative   05/17/2021 - 10/08/2021 Chemotherapy   Patient is on Treatment Plan : COLORECTAL FOLFOXIRI + Panitumumab q14d     05/17/2021 -  Chemotherapy   Patient is on Treatment Plan : COLORECTAL FOLFOXIRI q14d       CANCER STAGING:  Cancer Staging  Colon carcinoma [facility]) Staging form: Colon and Rectum, AJCC 8th Edition - Clinical stage from 04/06/2021: Stage IVC (cT4a, cN2b, pM1c) - Signed by Derek Jack, MD on 05/04/2021   INTERVAL HISTORY:  [name], a 51 y.o. female, seen for follow-up of metastatic colon cancer and toxicity assessment prior to next cycle of chemotherapy.  We have held her oxaliplatin for the last 2 cycles.  Reported feet numbness on and off.  Appetite is good.  She is continuing to gain  weight.  Energy levels are 100%.  REVIEW OF SYSTEMS:  Review of Systems  Constitutional:  Negative for appetite change and fatigue.  Neurological:  Positive for numbness (In the feet on and off since last treatment).  All other systems reviewed and are negative.   PAST MEDICAL/SURGICAL HISTORY:  Past Medical History:  Diagnosis Date   Anemia    Back pain    Hypertension    Port-A-Cath in place 05/10/2021   Past Surgical History:  Procedure Laterality Date   BIOPSY  03/02/2021   Procedure: BIOPSY;  Surgeon: Eloise Harman, DO;  Location[facility];  Service: Endoscopy;;   BOWEL RESECTION N/A 03/26/2021   Procedure: SMALL BOWEL RESECTION;  Surgeon: Aviva Signs, MD;  Location: [facility] ORS;  Service: General;  Laterality: N/A;   CHOLECYSTECTOMY     COLONOSCOPY WITH PROPOFOL N/A 10/04/2021   Procedure: COLONOSCOPY WITH PROPOFOL;  Surgeon: Eloise Harman, DO;  Location: [facility];  Service: Endoscopy;  Laterality: N/A;  12:15PM, VIA OSTOMY   COLOSTOMY N/A 03/26/2021   Procedure: COLOSTOMY;  Surgeon: Aviva Signs, MD;  Location: [facility] ORS;  Service: General;  Laterality: N/A;   ESOPHAGOGASTRODUODENOSCOPY (EGD) WITH PROPOFOL N/A 03/02/2021   Procedure: ESOPHAGOGASTRODUODENOSCOPY (EGD) WITH PROPOFOL;  Surgeon: Eloise Harman, DO;  Location: [facility];  Service: Endoscopy;  Laterality: N/A;   PARTIAL COLECTOMY N/A 03/26/2021   Procedure: PARTIAL COLECTOMY;  Surgeon: Aviva Signs, MD;  Location: [facility] OR[facility];  Service: General;  Laterality: N/A;   PORTACATH PLACEMENT Left 04/14/2021   Procedure: INSERTION PORT-A-CATH;  Surgeon: Aviva Signs, MD;  Location: [facility] ORS;  Service: General;  Laterality: Left;   SCLEROTHERAPY  03/02/2021   Procedure: Clide Deutscher;  Surgeon: Eloise Harman, DO;  Location: [facility];  Service: Endoscopy;;   SIGMOIDOSCOPY  03/02/2021   Procedure: SIGMOIDOSCOPY;  Surgeon: Eloise Harman, DO;  Location[facility];  Service: Endoscopy;;    SOCIAL  HISTORY:  Social History   Socioeconomic History   Marital status: Married    Spouse name: Not on file   Number of children: Not on file   Years of education: Not on file   Highest education level: Not on file  Occupational History   Not on file  Tobacco Use   Smoking status: Never   Smokeless tobacco: Never  Vaping Use   Vaping Use: Never used  Substance and Sexual Activity   Alcohol use: No   Drug use: No   Sexual activity: Not on file  Other Topics Concern   Not on file  Social History Narrative   Not on file   Social Determinants of Health   Financial Resource Strain: High Risk (05/19/2021)   Overall Financial Resource Strain (CARDIA)    Difficulty of Paying Living Expenses: Hard  Food Insecurity: Not on file  Transportation Needs: Not on file  Physical Activity: Not on file  Stress: Not on file  Social Connections: Not on file  Intimate Partner Violence: Not on file    FAMILY HISTORY:  Family History  Problem Relation Age of Onset   Hypertension Mother    Hypertension Father    Colon cancer Neg Hx    Colon polyps Neg Hx    Inflammatory bowel disease Neg Hx     CURRENT MEDICATIONS:  Current Outpatient Medications  Medication Sig Dispense Refill   doxycycline (VIBRA-TABS) 100 MG tablet Take 1 tablet (100 mg total) by mouth 2 (two) times daily. 90 tablet 3   ferrous sulfate 325 (65 FE) MG tablet TAKE 325 MG BY MOUTH DAILY. (Patient taking differently: 325 mg daily with breakfast.) 90 tablet 1   fexofenadine (ALLEGRA) 180 MG tablet Take 1 tablet (180 mg total) by mouth daily. 30 tablet 0   fluorouracil CALGB 54562 2,400 mg/m2 in sodium chloride 0.9 % 150 mL Inject 2,400 mg/m2 into the vein over 48 hr. Every 14 days     FLUOROURACIL IV Inject into the vein every 14 (fourteen) days.     fluticasone (FLONASE) 50 MCG/ACT nasal spray Place 2 sprays into both nostrils daily. 16 g 12   hydrocortisone 1 % lotion Apply 1 application. topically 2 (two) times daily. 118  mL 5   lidocaine (XYLOCAINE) 2 % solution Use as directed 10 mLs in the mouth or throat 4 (four) times daily as needed for mouth pain (Mix 1:1 with carafate and swish and spit). 100 mL ML   magnesium oxide (MAG-OX) 400 (240 Mg) MG tablet Take 1 tablet (400 mg total) by mouth 2 (two) times daily. 60 tablet 6   medroxyPROGESTERone (DEPO-PROVERA) 150 MG/ML injection Inject 150 mg into the muscle every 3 (three) months.     megestrol (MEGACE) 400 MG/10ML suspension Take 10 mLs (400 mg total) by mouth 2 (two) times daily. 240 mL 2   metroNIDAZOLE (METROGEL) 0.75 % vaginal gel Place vaginally at bedtime.     Multiple Vitamin (MULTIVITAMIN) tablet Take 1 tablet by mouth daily.     nebivolol (BYSTOLIC) 10 MG tablet Take 1 tablet (10 mg total) by mouth daily. 30 tablet 1   OXALIPLATIN IV Inject into the vein every 14 (fourteen) days.     Panitumumab (  VECTIBIX IV) Inject into the vein every 14 (fourteen) days.     pantoprazole (PROTONIX) 40 MG tablet Take 40 mg by mouth daily as needed (acid reflux).     potassium chloride SA (KLOR-CON M20) 20 MEQ tablet Take 1 tablet (20 mEq total) by mouth in the morning, at noon, in the evening, and at bedtime. 120 tablet 3   sucralfate (CARAFATE) 1 GM/10ML suspension Take 10 mLs (1 g total) by mouth 4 (four) times daily as needed. Mix 1:1 with 2% lidocaine viscous solution and swish and spit 420 mL 6   No current facility-administered medications for this visit.    ALLERGIES:  Allergies  Allergen Reactions   Zithromax [Azithromycin] Hives    infusing arm began to swell , her face got red, and she broke out, there are bumps around where the IV site was infusing   Motrin [Ibuprofen] Itching   Sudafed [Pseudoephedrine Hcl] Other (See Comments)    Hypertension   Zestril [Lisinopril] Cough    PHYSICAL EXAM:  Performance status (ECOG): 1 - Symptomatic but completely ambulatory  There were no vitals filed for this visit.  Wt Readings from Last 3 Encounters:   01/03/22 152 lb 6.4 oz (69.1 kg)  12/14/21 144 lb 12.8 oz (65.7 kg)  11/17/21 140 lb 3.2 oz (63.6 kg)   Physical Exam Vitals reviewed.  Constitutional:      Appearance: Normal appearance.  Cardiovascular:     Rate and Rhythm: Normal rate and regular rhythm.     Pulses: Normal pulses.     Heart sounds: Normal heart sounds.  Pulmonary:     Effort: Pulmonary effort is normal.     Breath sounds: Normal breath sounds.  Neurological:     General: No focal deficit present.     Mental Status: She is alert and oriented to person, place, and time.  Psychiatric:        Mood and Affect: Mood normal.        Behavior: Behavior normal.    LABORATORY DATA:  I have reviewed the labs as listed.     Latest Ref Rng & Units 01/03/2022    8:35 AM 12/14/2021    8:36 AM 11/17/2021    8:49 AM  CBC  WBC 4.0 - 10.5 K/uL 4.9  4.8  5.3   Hemoglobin 12.0 - 15.0 g/dL 10.5  11.3  12.0   Hematocrit 36.0 - 46.0 % 31.1  33.3  35.5   Platelets 150 - 400 K/uL 230  249  159       Latest Ref Rng & Units 01/03/2022    8:32 AM 12/14/2021    8:36 AM 11/17/2021    8:49 AM  CMP  Glucose 70 - 99 mg/dL 127  94  94   BUN 6 - 20 mg/dL _0 Creatinine 0.44 - 1.00 mg/dL 0.65  0.68  0.56   Sodium 135 - 145 mmol/L 139  142  140   Potassium 3.5 - 5.1 mmol/L 3.4  3.5  3.6   Chloride 98 - 111 mmol/L 113  110  110   CO2 22 - 32 mmol/L _1 Calcium 8.9 - 10.3 mg/dL 8.5  8.8  9.0   Total Protein 6.5 - 8.1 g/dL 6.4  6.9  7.3   Total Bilirubin 0.3 - 1.2 mg/dL 0.4  0.7  0.2   Alkaline Phos 38 - 126 U/L 62  58  82   AST  15 - 41 U/L _0 ALT 0 - 44 U/L _1 DIAGNOSTIC IMAGING:  I have independently reviewed the scans and discussed with the patient. No results found.   ASSESSMENT:  PT4PN2B N1C stage IV sigmoid colon cancer, NRAS negative: - Colonoscopy on 03/02/2021: Nonbleeding internal hemorrhoids, severe stenosis in the sigmoid colon at approximately 18 cm from anal verge, unable to  be traversed with pediatric colonoscopy.  Biopsy consistent with moderately to poorly differentiated adenocarcinoma. - CTAP with contrast on 01/10/2021: Diffusely thickened, 10 cm segment of distal sigmoid colon with adjacent moderate inflammatory reaction, suspected 2 sites of small bowel fistulization to the diseased segment and a rim-enhancing low-density 2.5 x 1.8 cm stricture medial to the mid segment which could represent a necrotic lymph node or contained perforation/abscess.  There are bilateral mildly enlarged common iliac chain nodes.  Mildly dilated mid to lower abdominal small bowel segments.  Mildly prominent slightly steatotic liver. - Sigmoid colon resection, partial small bowel resection and Hartman's procedure on 03/26/2021: Moderately to poorly differentiated adenocarcinoma involving the subserosal adipose tissue and present at the margin.  9/9 lymph nodes positive for metastatic carcinoma.  Small bowel resection is positive for adenocarcinoma nodules in the serosal fat.  PT4PN2B.  MMR is preserved. - Operative report mentions obvious extension of the tumor outside the colon to the pelvic brim, unable to fully excise all of the tumor in the pelvis. - NGS testing: KRAS/NRAS negative, BRAF negative, HER2 negative, MSI-stable - PET scan on 04/29/2021: Multifocal hypermetabolic nodule, peritoneal and anterior abdominal wall adenopathy.  Findings worrisome for peritoneal carcinomatosis.  No evidence of hepatic metastatic disease or distant mets. - FOLFOXIRI and Vectibix started on 05/17/2021 - We will see how she responds and will refer to Dr. Crisoforo Oxford for resection/HIPEC therapy.  Colonoscopy through stoma and rectal stump on 10/04/2021 was negative.    Social/family history: - She lives at home with her family.  She works as a Quarry manager at [employer].  Non-smoker. - Paternal cousin had cancer.  Maternal aunt had cancer.   PLAN:  Stage IV sigmoid colon adenocarcinoma, MSI-stable, RAS/BRAF  negative: - CT CAP on 11/15/2021 showed further decrease in size of soft tissue density at the root of the sigmoid mesocolon with no evidence of new/progressive disease.  No metastatic disease in the chest. - CEA on 01/03/2022 improved to 7.6 from a peak of 61. - Reviewed labs today which showed normal LFTs and electrolytes.  CBC was grossly normal.  CEA is pending. - Recommend proceeding with FOLFIRI and Vectibix today and in 2 weeks.  RTC 4 weeks for follow-up.  Plan to repeat CT CAP prior to next visit.  If there is a very good response, will consider discontinuing CPT-11.  2.  Hypokalemia: - Continue potassium 20 mEq 4 times daily.  Potassium is 3.8.  3.  Peripheral neuropathy: - We have discontinued oxaliplatin on cycle 11, 12/14/2021. - She has on and off numbness in the feet which is stable.  4.  Microcytic anemia: - Continue iron tablet daily.  Hemoglobin is 11.1.  5.  Acneform rash: - Restart doxycycline if she develops rash.   Orders placed this encounter:  No orders of the defined types were placed in this encounter.    Derek Jack, MD [place] [phone]

## 2022-01-26 NOTE — Patient Instructions (Signed)
[facility]  Discharge Instructions: Thank you for choosing [facility] to provide your oncology and hematology care.  If you have a lab appointment with the [facility], please come in thru the Main Entrance and check in at the main information desk.  Wear comfortable clothing and clothing appropriate for easy access to any Portacath or PICC line.   We strive to give you quality time with your provider. You may need to reschedule your appointment if you arrive late (15 or more minutes).  Arriving late affects you and other patients whose appointments are after yours.  Also, if you miss three or more appointments without notifying the office, you may be dismissed from the clinic at the provider's discretion.      For prescription refill requests, have your pharmacy contact our office and allow 72 hours for refills to be completed.    Today you received the following chemotherapy and/or immunotherapy agents Vectibix and Folfiri   To help prevent nausea and vomiting after your treatment, we encourage you to take your nausea medication as directed.  BELOW ARE SYMPTOMS THAT SHOULD BE REPORTED IMMEDIATELY: *FEVER GREATER THAN 100.4 F (38 C) OR HIGHER *CHILLS OR SWEATING *NAUSEA AND VOMITING THAT IS NOT CONTROLLED WITH YOUR NAUSEA MEDICATION *UNUSUAL SHORTNESS OF BREATH *UNUSUAL BRUISING OR BLEEDING *URINARY PROBLEMS (pain or burning when urinating, or frequent urination) *BOWEL PROBLEMS (unusual diarrhea, constipation, pain near the anus) TENDERNESS IN MOUTH AND THROAT WITH OR WITHOUT PRESENCE OF ULCERS (sore throat, sores in mouth, or a toothache) UNUSUAL RASH, SWELLING OR PAIN  UNUSUAL VAGINAL DISCHARGE OR ITCHING   Items with * indicate a potential emergency and should be followed up as soon as possible or go to the Emergency Department if any problems should occur.  Please show the CHEMOTHERAPY ALERT CARD or IMMUNOTHERAPY ALERT CARD at check-in to the  Emergency Department and triage nurse.  Should you have questions after your visit or need to cancel or reschedule your appointment, please contact [facility] [phone]  and follow the prompts.  Office hours are 8:00 a.m. to 4:30 p.m. Monday - Friday. Please note that voicemails left after 4:00 p.m. may not be returned until the following business day.  We are closed weekends and major holidays. You have access to a nurse at all times for urgent questions. Please call the main number to the clinic [phone] and follow the prompts.  For any non-urgent questions, you may also contact your provider using MyChart. We now offer e-Visits for anyone 64 and older to request care online for non-urgent symptoms. For details visit [email or web address].   Also download the MyChart app! Go to the app store, search "MyChart", open the app, select [facility], and log in with your MyChart username and password.  Masks are optional in the cancer centers. If you would like for your care team to wear a mask while they are taking care of you, please let them know. You may have one support person who is at least 51 years old accompany you for your appointments.

## 2022-01-26 NOTE — Progress Notes (Signed)
Pt presents today for Vectibix and Folfiri per provider's order. Vital signs and labs WNL for treatment. OK[facility] to proceed with treatment today per Dr.K.  Vectibix and Folfiri given today per MD orders. Tolerated infusion without adverse affects. Vital signs stable. No complaints at this time. Discharged from clinic ambulatory in stable condition. Alert and oriented x 3. F/U with [facility] as scheduled.  5FU ambulatory pump infusing.

## 2022-01-26 NOTE — Progress Notes (Signed)
Patients port flushed without difficulty.  Good blood return noted with no bruising or swelling noted at site.  Stable during access and blood draw.  Pateint to remain accessed for treatment.

## 2022-01-27 ENCOUNTER — Other Ambulatory Visit: Payer: Self-pay

## 2022-01-28 ENCOUNTER — Inpatient Hospital Stay: Payer: Medicaid Other

## 2022-01-28 VITALS — BP 132/81 | HR 77 | Temp 98.6°F | Resp 18

## 2022-01-28 DIAGNOSIS — Z5111 Encounter for antineoplastic chemotherapy: Secondary | ICD-10-CM | POA: Diagnosis not present

## 2022-01-28 DIAGNOSIS — Z95828 Presence of other vascular implants and grafts: Secondary | ICD-10-CM

## 2022-01-28 DIAGNOSIS — C189 Malignant neoplasm of colon, unspecified: Secondary | ICD-10-CM

## 2022-01-28 LAB — CEA: CEA: 7.6 ng/mL — ABNORMAL HIGH (ref 0.0–4.7)

## 2022-01-28 MED ORDER — HEPARIN SOD (PORK) LOCK FLUSH 100 UNIT/ML IV SOLN
500.0000 [IU] | Freq: Once | INTRAVENOUS | Status: AC | PRN
  Administered 2022-01-28: 500 [IU]

## 2022-01-28 MED ORDER — SODIUM CHLORIDE 0.9% FLUSH
10.0000 mL | INTRAVENOUS | Status: DC | PRN
  Administered 2022-01-28: 10 mL

## 2022-01-28 MED ORDER — PEGFILGRASTIM-CBQV 6 MG/0.6ML ~~LOC~~ SOSY
6.0000 mg | PREFILLED_SYRINGE | Freq: Once | SUBCUTANEOUS | Status: AC
  Administered 2022-01-28: 6 mg via SUBCUTANEOUS
  Filled 2022-01-28: qty 0.6

## 2022-01-28 NOTE — Progress Notes (Signed)
Pt presents today for 5FU chemotherapy pump disconnection and Udenyca injection per provider's order. Vital signs stable and pt voiced no new complaints at this time. Port flushed easily with 10 mL of normal saline and 63m of heparin. Good blood return and needle removed intact. No bruising or swelling noted at the site.

## 2022-01-28 NOTE — Patient Instructions (Signed)
[facility]  Discharge Instructions: Thank you for choosing [facility] to provide your oncology and hematology care.  If you have a lab appointment with the [facility], please come in thru the Main Entrance and check in at the main information desk.  Wear comfortable clothing and clothing appropriate for easy access to any Portacath or PICC line.   We strive to give you quality time with your provider. You may need to reschedule your appointment if you arrive late (15 or more minutes).  Arriving late affects you and other patients whose appointments are after yours.  Also, if you miss three or more appointments without notifying the office, you may be dismissed from the clinic at the provider's discretion.      For prescription refill requests, have your pharmacy contact our office and allow 72 hours for refills to be completed.    Today you received 5FU chemotherapy pump d/c and Udenyca injection.   BELOW ARE SYMPTOMS THAT SHOULD BE REPORTED IMMEDIATELY: *FEVER GREATER THAN 100.4 F (38 C) OR HIGHER *CHILLS OR SWEATING *NAUSEA AND VOMITING THAT IS NOT CONTROLLED WITH YOUR NAUSEA MEDICATION *UNUSUAL SHORTNESS OF BREATH *UNUSUAL BRUISING OR BLEEDING *URINARY PROBLEMS (pain or burning when urinating, or frequent urination) *BOWEL PROBLEMS (unusual diarrhea, constipation, pain near the anus) TENDERNESS IN MOUTH AND THROAT WITH OR WITHOUT PRESENCE OF ULCERS (sore throat, sores in mouth, or a toothache) UNUSUAL RASH, SWELLING OR PAIN  UNUSUAL VAGINAL DISCHARGE OR ITCHING   Items with * indicate a potential emergency and should be followed up as soon as possible or go to the Emergency Department if any problems should occur.  Please show the CHEMOTHERAPY ALERT CARD or IMMUNOTHERAPY ALERT CARD at check-in to the Emergency Department and triage nurse.  Should you have questions after your visit or need to cancel or reschedule your appointment, please  contact [facility] [phone]  and follow the prompts.  Office hours are 8:00 a.m. to 4:30 p.m. Monday - Friday. Please note that voicemails left after 4:00 p.m. may not be returned until the following business day.  We are closed weekends and major holidays. You have access to a nurse at all times for urgent questions. Please call the main number to the clinic [phone] and follow the prompts.  For any non-urgent questions, you may also contact your provider using MyChart. We now offer e-Visits for anyone 66 and older to request care online for non-urgent symptoms. For details visit [email or web address].   Also download the MyChart app! Go to the app store, search "MyChart", open the app, select [facility], and log in with your MyChart username and password.  Masks are optional in the cancer centers. If you would like for your care team to wear a mask while they are taking care of you, please let them know. You may have one support person who is at least 51 years old accompany you for your appointments.

## 2022-02-01 ENCOUNTER — Ambulatory Visit: Payer: Medicaid Other

## 2022-02-01 ENCOUNTER — Ambulatory Visit: Payer: Medicaid Other | Admitting: Hematology

## 2022-02-01 ENCOUNTER — Other Ambulatory Visit: Payer: Medicaid Other

## 2022-02-15 ENCOUNTER — Ambulatory Visit: Payer: Medicaid Other | Admitting: Hematology

## 2022-02-15 ENCOUNTER — Inpatient Hospital Stay: Payer: Medicaid Other | Attending: Hematology

## 2022-02-15 ENCOUNTER — Inpatient Hospital Stay: Payer: Medicaid Other

## 2022-02-15 DIAGNOSIS — C187 Malignant neoplasm of sigmoid colon: Secondary | ICD-10-CM | POA: Insufficient documentation

## 2022-02-15 DIAGNOSIS — Z5189 Encounter for other specified aftercare: Secondary | ICD-10-CM | POA: Insufficient documentation

## 2022-02-15 DIAGNOSIS — Z5112 Encounter for antineoplastic immunotherapy: Secondary | ICD-10-CM | POA: Insufficient documentation

## 2022-02-15 DIAGNOSIS — Z5111 Encounter for antineoplastic chemotherapy: Secondary | ICD-10-CM | POA: Insufficient documentation

## 2022-02-17 ENCOUNTER — Inpatient Hospital Stay: Payer: Medicaid Other | Admitting: Licensed Clinical Social Worker

## 2022-02-17 ENCOUNTER — Other Ambulatory Visit: Payer: Self-pay

## 2022-02-17 DIAGNOSIS — C189 Malignant neoplasm of colon, unspecified: Secondary | ICD-10-CM

## 2022-02-17 NOTE — Progress Notes (Signed)
[facility] CSW Progress Note  Holiday representative  received an [employer] manager from pt requesting assistance w/ water bill.  CSW responded, requesting a copy of the water to submit to which pt states she will provide.  CSW awaiting receipt of bill and will apply for [place] if appropriate.  Pt confirms her electric bill has been taken care of.  CSW to remain available as appropriate throughout duration of treatment.        Henriette Combs, LCSW    Patient is participating in a Managed Medicaid Plan:  Yes

## 2022-02-18 ENCOUNTER — Inpatient Hospital Stay: Payer: Medicaid Other | Admitting: Licensed Clinical Social Worker

## 2022-02-18 DIAGNOSIS — C189 Malignant neoplasm of colon, unspecified: Secondary | ICD-10-CM

## 2022-02-18 NOTE — Progress Notes (Signed)
[facility] CSW Progress Note  Clinical Human resources officer from pt to submit.  Request for assistance w/ water bill submitted to the [facility] on behalf of pt.  CSW to continue to provide support as appropriate throughout duration of treatment.       Henriette Combs, LCSW    Patient is participating in a Managed Medicaid Plan:  Yes

## 2022-02-22 ENCOUNTER — Inpatient Hospital Stay: Payer: Medicaid Other

## 2022-02-22 ENCOUNTER — Inpatient Hospital Stay: Payer: Medicaid Other | Admitting: Licensed Clinical Social Worker

## 2022-02-22 VITALS — BP 150/79 | HR 81 | Temp 98.5°F | Resp 18

## 2022-02-22 DIAGNOSIS — C189 Malignant neoplasm of colon, unspecified: Secondary | ICD-10-CM

## 2022-02-22 DIAGNOSIS — Z95828 Presence of other vascular implants and grafts: Secondary | ICD-10-CM

## 2022-02-22 DIAGNOSIS — C187 Malignant neoplasm of sigmoid colon: Secondary | ICD-10-CM | POA: Diagnosis not present

## 2022-02-22 DIAGNOSIS — Z5189 Encounter for other specified aftercare: Secondary | ICD-10-CM | POA: Diagnosis not present

## 2022-02-22 DIAGNOSIS — Z5111 Encounter for antineoplastic chemotherapy: Secondary | ICD-10-CM | POA: Diagnosis not present

## 2022-02-22 DIAGNOSIS — Z5112 Encounter for antineoplastic immunotherapy: Secondary | ICD-10-CM | POA: Diagnosis present

## 2022-02-22 LAB — COMPREHENSIVE METABOLIC PANEL
ALT: 15 U/L (ref 0–44)
AST: 18 U/L (ref 15–41)
Albumin: 3.6 g/dL (ref 3.5–5.0)
Alkaline Phosphatase: 60 U/L (ref 38–126)
Anion gap: 9 (ref 5–15)
BUN: 12 mg/dL (ref 6–20)
CO2: 23 mmol/L (ref 22–32)
Calcium: 8.5 mg/dL — ABNORMAL LOW (ref 8.9–10.3)
Chloride: 105 mmol/L (ref 98–111)
Creatinine, Ser: 0.74 mg/dL (ref 0.44–1.00)
GFR, Estimated: >60 mL/min (ref >60)
Glucose, Bld: 95 mg/dL (ref 70–99)
Potassium: 3.9 mmol/L (ref 3.5–5.1)
Sodium: 137 mmol/L (ref 135–145)
Total Bilirubin: 0.5 mg/dL (ref 0.3–1.2)
Total Protein: 6.9 g/dL (ref 6.5–8.1)

## 2022-02-22 LAB — CBC WITH DIFFERENTIAL/PLATELET
Abs Immature Granulocytes: 0.01 10*3/uL (ref 0.00–0.07)
Basophils Absolute: 0.1 10*3/uL (ref 0.0–0.1)
Basophils Relative: 1 %
Eosinophils Absolute: 0.1 10*3/uL (ref 0.0–0.5)
Eosinophils Relative: 2 %
HCT: 34.8 % — ABNORMAL LOW (ref 36.0–46.0)
Hemoglobin: 11.4 g/dL — ABNORMAL LOW (ref 12.0–15.0)
Immature Granulocytes: 0 %
Lymphocytes Relative: 27 %
Lymphs Abs: 1.5 10*3/uL (ref 0.7–4.0)
MCH: 31.5 pg (ref 26.0–34.0)
MCHC: 32.8 g/dL (ref 30.0–36.0)
MCV: 96.1 fL (ref 80.0–100.0)
Monocytes Absolute: 0.9 10*3/uL (ref 0.1–1.0)
Monocytes Relative: 17 %
Neutro Abs: 2.9 10*3/uL (ref 1.7–7.7)
Neutrophils Relative %: 53 %
Platelets: 267 10*3/uL (ref 150–400)
RBC: 3.62 MIL/uL — ABNORMAL LOW (ref 3.87–5.11)
RDW: 13.6 % (ref 11.5–15.5)
WBC: 5.4 10*3/uL (ref 4.0–10.5)
nRBC: 0 % (ref 0.0–0.2)

## 2022-02-22 LAB — MAGNESIUM: Magnesium: 1.9 mg/dL (ref 1.7–2.4)

## 2022-02-22 MED ORDER — HEPARIN SOD (PORK) LOCK FLUSH 100 UNIT/ML IV SOLN: 500.0000 [IU] | Freq: Once | INTRAVENOUS | Status: DC | PRN

## 2022-02-22 MED ORDER — SODIUM CHLORIDE 0.9 % IV SOLN
6.0000 mg/kg | Freq: Once | INTRAVENOUS | Status: AC
  Administered 2022-02-22: 400 mg via INTRAVENOUS
  Filled 2022-02-22: qty 20

## 2022-02-22 MED ORDER — LEUCOVORIN CALCIUM INJECTION 350 MG
200.0000 mg/m2 | Freq: Once | INTRAVENOUS | Status: DC
  Filled 2022-02-22: qty 16.6

## 2022-02-22 MED ORDER — ATROPINE SULFATE 1 MG/ML IV SOLN
0.5000 mg | Freq: Once | INTRAVENOUS | Status: AC
  Administered 2022-02-22: 0.5 mg via INTRAVENOUS
  Filled 2022-02-22: qty 1

## 2022-02-22 MED ORDER — SODIUM CHLORIDE 0.9 % IV SOLN
2400.0000 mg/m2 | INTRAVENOUS | Status: DC
  Administered 2022-02-22: 4000 mg via INTRAVENOUS
  Filled 2022-02-22: qty 80

## 2022-02-22 MED ORDER — SODIUM CHLORIDE 0.9 % IV SOLN: Freq: Once | INTRAVENOUS | Status: AC

## 2022-02-22 MED ORDER — SODIUM CHLORIDE 0.9 % IV SOLN
10.0000 mg | Freq: Once | INTRAVENOUS | Status: AC
  Administered 2022-02-22: 10 mg via INTRAVENOUS
  Filled 2022-02-22: qty 10

## 2022-02-22 MED ORDER — LEUCOVORIN CALCIUM INJECTION 350 MG
200.0000 mg/m2 | Freq: Once | INTRAVENOUS | Status: AC
  Administered 2022-02-22: 332 mg via INTRAVENOUS
  Filled 2022-02-22: qty 16.6

## 2022-02-22 MED ORDER — SODIUM CHLORIDE 0.9 % IV SOLN
165.0000 mg/m2 | Freq: Once | INTRAVENOUS | Status: DC
  Filled 2022-02-22: qty 14

## 2022-02-22 MED ORDER — SODIUM CHLORIDE 0.9% FLUSH: 10.0000 mL | INTRAVENOUS | Status: DC | PRN

## 2022-02-22 MED ORDER — PALONOSETRON HCL INJECTION 0.25 MG/5ML
0.2500 mg | Freq: Once | INTRAVENOUS | Status: AC
  Administered 2022-02-22: 0.25 mg via INTRAVENOUS
  Filled 2022-02-22: qty 5

## 2022-02-22 MED ORDER — SODIUM CHLORIDE 0.9% FLUSH
10.0000 mL | Freq: Once | INTRAVENOUS | Status: AC
  Administered 2022-02-22: 10 mL via INTRAVENOUS

## 2022-02-22 MED ORDER — SODIUM CHLORIDE 0.9 % IV SOLN
165.0000 mg/m2 | Freq: Once | INTRAVENOUS | Status: AC
  Administered 2022-02-22: 280 mg via INTRAVENOUS
  Filled 2022-02-22: qty 14

## 2022-02-22 MED ORDER — SODIUM CHLORIDE 0.9 % IV SOLN
150.0000 mg | Freq: Once | INTRAVENOUS | Status: AC
  Administered 2022-02-22: 150 mg via INTRAVENOUS
  Filled 2022-02-22: qty 150

## 2022-02-22 NOTE — Progress Notes (Signed)
Patient presents today for treatment. Blood pressure elevated on arrival. Patient denies any headache, blurred vision or dizziness. MAR updated. Labs pending. Patient has no complaints of any significant changes since her last visit.   Labs within parameters for treatment.   Treatment given today per MD orders. Tolerated infusion without adverse affects. Vital signs stable. 5FU pump infusing and RUN noted on screen and verified with patient. No complaints at this time. Discharged from clinic ambulatory in stable condition. Alert and oriented x 3. F/U with [facility] as scheduled.

## 2022-02-22 NOTE — Progress Notes (Signed)
Patients port flushed without difficulty.  Good blood return noted with no bruising or swelling noted at site.  Patient to remain accessed for treatment.  

## 2022-02-22 NOTE — Progress Notes (Signed)
[place] [facility] CSW Progress Note  Holiday representative  received confirmation that pt has been approved for financial assistance from the [employer].  CSW telephoned pt to inform of the above.  Patient assistance consent scanned and emailed to pt for signature.  Pt informed the consent will need to be signed and returned before financial assistance can be released.  Pt verbalized agreement to return signed form as soon as possible.  CSW to follow up once signed consent is received.          Henriette Combs, LCSW    Patient is participating in a Managed Medicaid Plan:  Yes

## 2022-02-22 NOTE — Progress Notes (Signed)
Keep dose vectibex same as previous dose

## 2022-02-22 NOTE — Patient Instructions (Signed)
[facility]  Discharge Instructions: Thank you for choosing [facility] to provide your oncology and hematology care.  If you have a lab appointment with the [facility], please come in thru the Main Entrance and check in at the main information desk.  Wear comfortable clothing and clothing appropriate for easy access to any Portacath or PICC line.   We strive to give you quality time with your provider. You may need to reschedule your appointment if you arrive late (15 or more minutes).  Arriving late affects you and other patients whose appointments are after yours.  Also, if you miss three or more appointments without notifying the office, you may be dismissed from the clinic at the provider's discretion.      For prescription refill requests, have your pharmacy contact our office and allow 72 hours for refills to be completed.    Today you received the following chemotherapy and/or immunotherapy agents Vectibix, leucovorin, irinotecan, Adrucil 5FU pump. Fluorouracil Injection What is this medication? FLUOROURACIL (flure oh YOOR a sil) treats some types of cancer. It works by slowing down the growth of cancer cells. This medicine may be used for other purposes; ask your health care provider or pharmacist if you have questions. COMMON BRAND NAME(S): Adrucil What should I tell my care team before I take this medication? They need to know if you have any of these conditions: Blood disorders Dihydropyrimidine dehydrogenase (DPD) deficiency Infection, such as chickenpox, cold sores, herpes Kidney disease Liver disease Poor nutrition Recent or ongoing radiation therapy An unusual or allergic reaction to fluorouracil, other medications, foods, dyes, or preservatives If you or your partner are pregnant or trying to get pregnant Breast-feeding How should I use this medication? This medication is injected into a vein. It is administered by your care team in  a hospital or clinic setting. Talk to your care team about the use of this medication in children. Special care may be needed. Overdosage: If you think you have taken too much of this medicine contact a poison control center or emergency room at once. NOTE: This medicine is only for you. Do not share this medicine with others. What if I miss a dose? Keep appointments for follow-up doses. It is important not to miss your dose. Call your care team if you are unable to keep an appointment. What may interact with this medication? Do not take this medication with any of the following: Live virus vaccines This medication may also interact with the following: Medications that treat or prevent blood clots, such as warfarin, enoxaparin, dalteparin This list may not describe all possible interactions. Give your health care provider a list of all the medicines, herbs, non-prescription drugs, or dietary supplements you use. Also tell them if you smoke, drink alcohol, or use illegal drugs. Some items may interact with your medicine. What should I watch for while using this medication? Your condition will be monitored carefully while you are receiving this medication. This medication may make you feel generally unwell. This is not uncommon as chemotherapy can affect healthy cells as well as cancer cells. Report any side effects. Continue your course of treatment even though you feel ill unless your care team tells you to stop. In some cases, you may be given additional medications to help with side effects. Follow all directions for their use. This medication may increase your risk of getting an infection. Call your care team for advice if you get a fever, chills, sore  throat, or other symptoms of a cold or flu. Do not treat yourself. Try to avoid being around people who are sick. This medication may increase your risk to bruise or bleed. Call your care team if you notice any unusual bleeding. Be careful  brushing or flossing your teeth or using a toothpick because you may get an infection or bleed more easily. If you have any dental work done, tell your dentist you are receiving this medication. Avoid taking medications that contain aspirin, acetaminophen, ibuprofen, naproxen, or ketoprofen unless instructed by your care team. These medications may hide a fever. Do not treat diarrhea with over the counter products. Contact your care team if you have diarrhea that lasts more than 2 days or if it is severe and watery. This medication can make you more sensitive to the sun. Keep out of the sun. If you cannot avoid being in the sun, wear protective clothing and sunscreen. Do not use sun lamps, tanning beds, or tanning booths. Talk to your care team if you or your partner wish to become pregnant or think you might be pregnant. This medication can cause serious birth defects if taken during pregnancy and for 3 months after the last dose. A reliable form of contraception is recommended while taking this medication and for 3 months after the last dose. Talk to your care team about effective forms of contraception. Do not father a child while taking this medication and for 3 months after the last dose. Use a condom while having sex during this time period. Do not breastfeed while taking this medication. This medication may cause infertility. Talk to your care team if you are concerned about your fertility. What side effects may I notice from receiving this medication? Side effects that you should report to your care team as soon as possible: Allergic reactions--skin rash, itching, hives, swelling of the face, lips, tongue, or throat Heart attack--pain or tightness in the chest, shoulders, arms, or jaw, nausea, shortness of breath, cold or clammy skin, feeling faint or lightheaded Heart failure--shortness of breath, swelling of the ankles, feet, or hands, sudden weight gain, unusual weakness or fatigue Heart  rhythm changes--fast or irregular heartbeat, dizziness, feeling faint or lightheaded, chest pain, trouble breathing High ammonia level--unusual weakness or fatigue, confusion, loss of appetite, nausea, vomiting, seizures Infection--fever, chills, cough, sore throat, wounds that don't heal, pain or trouble when passing urine, general feeling of discomfort or being unwell Low red blood cell level--unusual weakness or fatigue, dizziness, headache, trouble breathing Pain, tingling, or numbness in the hands or feet, muscle weakness, change in vision, confusion or trouble speaking, loss of balance or coordination, trouble walking, seizures Redness, swelling, and blistering of the skin over hands and feet Severe or prolonged diarrhea Unusual bruising or bleeding Side effects that usually do not require medical attention (report to your care team if they continue or are bothersome): Dry skin Headache Increased tears Nausea Pain, redness, or swelling with sores inside the mouth or throat Sensitivity to light Vomiting This list may not describe all possible side effects. Call your doctor for medical advice about side effects. You may report side effects to FDA at 1-800-FDA-1088. Where should I keep my medication? This medication is given in a hospital or clinic. It will not be stored at home. NOTE: This sheet is a summary. It may not cover all possible information. If you have questions about this medicine, talk to your doctor, pharmacist, or health care provider.  2023 Elsevier/Gold Standard (2021-05-25 00:00:00)  Irinotecan Injection What is this medication? IRINOTECAN (ir in oh TEE kan) treats some types of cancer. It works by slowing down the growth of cancer cells. This medicine may be used for other purposes; ask your health care provider or pharmacist if you have questions. COMMON BRAND NAME(S): Camptosar What should I tell my care team before I take this medication? They need to know if you  have any of these conditions: Dehydration Diarrhea Infection, especially a viral infection, such as chickenpox, cold sores, herpes Liver disease Low blood cell levels (white cells, red cells, and platelets) Low levels of electrolytes, such as calcium, magnesium, or potassium in your blood Recent or ongoing radiation An unusual or allergic reaction to irinotecan, other medications, foods, dyes, or preservatives If you or your partner are pregnant or trying to get pregnant Breast-feeding How should I use this medication? This medication is injected into a vein. It is given by your care team in a hospital or clinic setting. Talk to your care team about the use of this medication in children. Special care may be needed. Overdosage: If you think you have taken too much of this medicine contact a poison control center or emergency room at once. NOTE: This medicine is only for you. Do not share this medicine with others. What if I miss a dose? Keep appointments for follow-up doses. It is important not to miss your dose. Call your care team if you are unable to keep an appointment. What may interact with this medication? Do not take this medication with any of the following: Cobicistat Itraconazole This medication may also interact with the following: Certain antibiotics, such as clarithromycin, rifampin, rifabutin Certain antivirals for HIV or AIDS Certain medications for fungal infections, such as ketoconazole, posaconazole, voriconazole Certain medications for seizures, such as carbamazepine, phenobarbital, phenytoin Gemfibrozil Nefazodone St. John's wort This list may not describe all possible interactions. Give your health care provider a list of all the medicines, herbs, non-prescription drugs, or dietary supplements you use. Also tell them if you smoke, drink alcohol, or use illegal drugs. Some items may interact with your medicine. What should I watch for while using this  medication? Your condition will be monitored carefully while you are receiving this medication. You may need blood work while taking this medication. This medication may make you feel generally unwell. This is not uncommon as chemotherapy can affect healthy cells as well as cancer cells. Report any side effects. Continue your course of treatment even though you feel ill unless your care team tells you to stop. This medication can cause serious side effects. To reduce the risk, your care team may give you other medications to take before receiving this one. Be sure to follow the directions from your care team. This medication may affect your coordination, reaction time, or judgement. Do not drive or operate machinery until you know how this medication affects you. Sit up or stand slowly to reduce the risk of dizzy or fainting spells. Drinking alcohol with this medication can increase the risk of these side effects. This medication may increase your risk of getting an infection. Call your care team for advice if you get a fever, chills, sore throat, or other symptoms of a cold or flu. Do not treat yourself. Try to avoid being around people who are sick. Avoid taking medications that contain aspirin, acetaminophen, ibuprofen, naproxen, or ketoprofen unless instructed by your care team. These medications may hide a fever. This medication may increase your risk to bruise  or bleed. Call your care team if you notice any unusual bleeding. Be careful brushing or flossing your teeth or using a toothpick because you may get an infection or bleed more easily. If you have any dental work done, tell your dentist you are receiving this medication. Talk to your care team if you or your partner are pregnant or think either of you might be pregnant. This medication can cause serious birth defects if taken during pregnancy and for 6 months after the last dose. You will need a negative pregnancy test before starting this  medication. Contraception is recommended while taking this medication and for 6 months after the last dose. Your care team can help you find the option that works for you. Do not father a child while taking this medication and for 3 months after the last dose. Use a condom for contraception during this time period. Do not breastfeed while taking this medication and for 7 days after the last dose. This medication may cause infertility. Talk to your care team if you are concerned about your fertility. What side effects may I notice from receiving this medication? Side effects that you should report to your care team as soon as possible: Allergic reactions--skin rash, itching, hives, swelling of the face, lips, tongue, or throat Dry cough, shortness of breath or trouble breathing Increased saliva or tears, increased sweating, stomach cramping, diarrhea, small pupils, unusual weakness or fatigue, slow heartbeat Infection--fever, chills, cough, sore throat, wounds that don't heal, pain or trouble when passing urine, general feeling of discomfort or being unwell Kidney injury--decrease in the amount of urine, swelling of the ankles, hands, or feet Low red blood cell level--unusual weakness or fatigue, dizziness, headache, trouble breathing Severe or prolonged diarrhea Unusual bruising or bleeding Side effects that usually do not require medical attention (report to your care team if they continue or are bothersome): Constipation Diarrhea Hair loss Loss of appetite Nausea Stomach pain This list may not describe all possible side effects. Call your doctor for medical advice about side effects. You may report side effects to FDA at 1-800-FDA-1088. Where should I keep my medication? This medication is given in a hospital or clinic. It will not be stored at home. NOTE: This sheet is a summary. It may not cover all possible information. If you have questions about this medicine, talk to your doctor,  pharmacist, or health care provider.  2023 Elsevier/Gold Standard (2021-06-03 00:00:00) Leucovorin Injection What is this medication? LEUCOVORIN (loo koe VOR in) prevents side effects from certain medications, such as methotrexate. It works by increasing folate levels. This helps protect healthy cells in your body. It may also be used to treat anemia caused by low levels of folate. It can also be used with fluorouracil, a type of chemotherapy, to treat colorectal cancer. It works by increasing the effects of fluorouracil in the body. This medicine may be used for other purposes; ask your health care provider or pharmacist if you have questions. What should I tell my care team before I take this medication? They need to know if you have any of these conditions: Anemia from low levels of vitamin B12 in the blood An unusual or allergic reaction to leucovorin, folic acid, other medications, foods, dyes, or preservatives Pregnant or trying to get pregnant Breastfeeding How should I use this medication? This medication is injected into a vein or a muscle. It is given by your care team in a hospital or clinic setting. Talk to your care team  about the use of this medication in children. Special care may be needed. Overdosage: If you think you have taken too much of this medicine contact a poison control center or emergency room at once. NOTE: This medicine is only for you. Do not share this medicine with others. What if I miss a dose? Keep appointments for follow-up doses. It is important not to miss your dose. Call your care team if you are unable to keep an appointment. What may interact with this medication? Capecitabine Fluorouracil Phenobarbital Phenytoin Primidone Trimethoprim;sulfamethoxazole This list may not describe all possible interactions. Give your health care provider a list of all the medicines, herbs, non-prescription drugs, or dietary supplements you use. Also tell them if you  smoke, drink alcohol, or use illegal drugs. Some items may interact with your medicine. What should I watch for while using this medication? Your condition will be monitored carefully while you are receiving this medication. This medication may increase the side effects of 5-fluorouracil. Tell your care team if you have diarrhea or mouth sores that do not get better or that get worse. What side effects may I notice from receiving this medication? Side effects that you should report to your care team as soon as possible: Allergic reactions--skin rash, itching, hives, swelling of the face, lips, tongue, or throat This list may not describe all possible side effects. Call your doctor for medical advice about side effects. You may report side effects to FDA at 1-800-FDA-1088. Where should I keep my medication? This medication is given in a hospital or clinic. It will not be stored at home. NOTE: This sheet is a summary. It may not cover all possible information. If you have questions about this medicine, talk to your doctor, pharmacist, or health care provider.  2023 Elsevier/Gold Standard (2021-06-29 00:00:00) Panitumumab Injection What is this medication? PANITUMUMAB (pan i TOOM ue mab) treats colorectal cancer. It works by blocking a protein that causes cancer cells to grow and multiply. This helps to slow or stop the spread of cancer cells. It is a monoclonal antibody. This medicine may be used for other purposes; ask your health care provider or pharmacist if you have questions. COMMON BRAND NAME(S): Vectibix What should I tell my care team before I take this medication? They need to know if you have any of these conditions: Eye disease Low levels of magnesium in the blood Lung disease An unusual or allergic reaction to panitumumab, other medications, foods, dyes, or preservatives Pregnant or trying to get pregnant Breast-feeding How should I use this medication? This medication is  injected into a vein. It is given by your care team in a hospital or clinic setting. Talk to your care team about the use of this medication in children. Special care may be needed. Overdosage: If you think you have taken too much of this medicine contact a poison control center or emergency room at once. NOTE: This medicine is only for you. Do not share this medicine with others. What if I miss a dose? Keep appointments for follow-up doses. It is important not to miss your dose. Call your care team if you are unable to keep an appointment. What may interact with this medication? Bevacizumab This list may not describe all possible interactions. Give your health care provider a list of all the medicines, herbs, non-prescription drugs, or dietary supplements you use. Also tell them if you smoke, drink alcohol, or use illegal drugs. Some items may interact with your medicine. What should I  watch for while using this medication? Your condition will be monitored carefully while you are receiving this medication. This medication may make you feel generally unwell. This is not uncommon as chemotherapy can affect healthy cells as well as cancer cells. Report any side effects. Continue your course of treatment even though you feel ill unless your care team tells you to stop. This medication can make you more sensitive to the sun. Keep out of the sun while receiving this medication and for 2 months after stopping therapy. If you cannot avoid being in the sun, wear protective clothing and sunscreen. Do not use sun lamps, tanning beds, or tanning booths. Check with your care team if you have severe diarrhea, nausea, and vomiting or if you sweat a lot. The loss of too much body fluid may make it dangerous for you to take this medication. This medication may cause serious skin reactions. They can happen weeks to months after starting the medication. Contact your care team right away if you notice fevers or flu-like  symptoms with a rash. The rash may be red or purple and then turn into blisters or peeling of the skin. You may also notice a red rash with swelling of the face, lips, or lymph nodes in your neck or under your arms. Talk to your care team if you may be pregnant. Serious birth defects can occur if you take this medication during pregnancy and for 2 months after the last dose. Contraception is recommended while taking this medication and for 2 months after the last dose. Your care team can help you find the option that works for you. Do not breastfeed while taking this medication and for 2 months after the last dose. This medication may cause infertility. Talk to your care team if you are concerned about your fertility. What side effects may I notice from receiving this medication? Side effects that you should report to your care team as soon as possible: Allergic reactions--skin rash, itching, hives, swelling of the face, lips, tongue, or throat Dry cough, shortness of breath or trouble breathing Eye pain, redness, irritation, or discharge with blurry or decreased vision Infusion reactions--chest pain, shortness of breath or trouble breathing, feeling faint or lightheaded Low magnesium level--muscle pain or cramps, unusual weakness or fatigue, fast or irregular heartbeat, tremors Low potassium level--muscle pain or cramps, unusual weakness or fatigue, fast or irregular heartbeat, constipation Redness, blistering, peeling, or loosening of the skin, including inside the mouth Skin reactions on sun-exposed areas Side effects that usually do not require medical attention (report to your care team if they continue or are bothersome): Change in nail shape, thickness, or color Diarrhea Dry skin Fatigue Nausea Vomiting This list may not describe all possible side effects. Call your doctor for medical advice about side effects. You may report side effects to FDA at 1-800-FDA-1088. Where should I keep my  medication? This medication is given in a hospital or clinic. It will not be stored at home. NOTE: This sheet is a summary. It may not cover all possible information. If you have questions about this medicine, talk to your doctor, pharmacist, or health care provider.  2023 Elsevier/Gold Standard (2021-06-09 00:00:00)       To help prevent nausea and vomiting after your treatment, we encourage you to take your nausea medication as directed.  BELOW ARE SYMPTOMS THAT SHOULD BE REPORTED IMMEDIATELY: *FEVER GREATER THAN 100.4 F (38 C) OR HIGHER *CHILLS OR SWEATING *NAUSEA AND VOMITING THAT IS NOT CONTROLLED WITH  YOUR NAUSEA MEDICATION *UNUSUAL SHORTNESS OF BREATH *UNUSUAL BRUISING OR BLEEDING *URINARY PROBLEMS (pain or burning when urinating, or frequent urination) *BOWEL PROBLEMS (unusual diarrhea, constipation, pain near the anus) TENDERNESS IN MOUTH AND THROAT WITH OR WITHOUT PRESENCE OF ULCERS (sore throat, sores in mouth, or a toothache) UNUSUAL RASH, SWELLING OR PAIN  UNUSUAL VAGINAL DISCHARGE OR ITCHING   Items with * indicate a potential emergency and should be followed up as soon as possible or go to the Emergency Department if any problems should occur.  Please show the CHEMOTHERAPY ALERT CARD or IMMUNOTHERAPY ALERT CARD at check-in to the Emergency Department and triage nurse.  Should you have questions after your visit or need to cancel or reschedule your appointment, please contact [facility] [phone]  and follow the prompts.  Office hours are 8:00 a.m. to 4:30 p.m. Monday - Friday. Please note that voicemails left after 4:00 p.m. may not be returned until the following business day.  We are closed weekends and major holidays. You have access to a nurse at all times for urgent questions. Please call the main number to the clinic [phone] and follow the prompts.  For any non-urgent questions, you may also contact your provider using MyChart. We now  offer e-Visits for anyone 97 and older to request care online for non-urgent symptoms. For details visit [email or web address].   Also download the MyChart app! Go to the app store, search "MyChart", open the app, select [facility], and log in with your MyChart username and password.

## 2022-02-23 ENCOUNTER — Inpatient Hospital Stay: Payer: Medicaid Other | Admitting: Licensed Clinical Social Worker

## 2022-02-23 DIAGNOSIS — C189 Malignant neoplasm of colon, unspecified: Secondary | ICD-10-CM

## 2022-02-23 LAB — CEA: CEA: 6.7 ng/mL — ABNORMAL HIGH (ref 0.0–4.7)

## 2022-02-23 NOTE — Progress Notes (Signed)
[facility] CSW Progress Note  Clinical Education officer, museum  received signed patient assistance consent.    Signed consent forwarded to the [facility].  CSW contacted the [facility] to pay the water bill on pt's behalf.  Bill paid and confirmation received that water is being turned back on.  Message sent to pt notifying of the above.    Henriette Combs, LCSW    Patient is participating in a Managed Medicaid Plan:  Yes

## 2022-02-24 ENCOUNTER — Inpatient Hospital Stay: Payer: Medicaid Other

## 2022-02-24 VITALS — BP 126/83 | HR 92 | Temp 98.5°F | Resp 18

## 2022-02-24 DIAGNOSIS — C189 Malignant neoplasm of colon, unspecified: Secondary | ICD-10-CM

## 2022-02-24 DIAGNOSIS — Z95828 Presence of other vascular implants and grafts: Secondary | ICD-10-CM

## 2022-02-24 DIAGNOSIS — Z5112 Encounter for antineoplastic immunotherapy: Secondary | ICD-10-CM | POA: Diagnosis not present

## 2022-02-24 MED ORDER — HEPARIN SOD (PORK) LOCK FLUSH 100 UNIT/ML IV SOLN
500.0000 [IU] | Freq: Once | INTRAVENOUS | Status: AC | PRN
  Administered 2022-02-24: 500 [IU]

## 2022-02-24 MED ORDER — SODIUM CHLORIDE 0.9% FLUSH
10.0000 mL | INTRAVENOUS | Status: DC | PRN
  Administered 2022-02-24: 10 mL

## 2022-02-24 MED ORDER — PEGFILGRASTIM-CBQV 6 MG/0.6ML ~~LOC~~ SOSY
6.0000 mg | PREFILLED_SYRINGE | Freq: Once | SUBCUTANEOUS | Status: AC
  Administered 2022-02-24: 6 mg via SUBCUTANEOUS
  Filled 2022-02-24: qty 0.6

## 2022-02-24 NOTE — Progress Notes (Signed)
Pt presents today for 5FU chemotherapy and Udenyca injection per provider's order. Vital signs stable, port flushed easily with 81m of normal saline and 543mof heparin. Good blood return noted and needle removed intact. No bruising or swelling noted at the site.  Discharged from clinic ambulatory in stable condition. Alert and oriented x 3. [facility].

## 2022-02-24 NOTE — Patient Instructions (Signed)
[facility]  Discharge Instructions: Thank you for choosing [facility] to provide your oncology and hematology care.  If you have a lab appointment with the [facility], please come in thru the Main Entrance and check in at the main information desk.  Wear comfortable clothing and clothing appropriate for easy access to any Portacath or PICC line.   We strive to give you quality time with your provider. You may need to reschedule your appointment if you arrive late (15 or more minutes).  Arriving late affects you and other patients whose appointments are after yours.  Also, if you miss three or more appointments without notifying the office, you may be dismissed from the clinic at the provider's discretion.      For prescription refill requests, have your pharmacy contact our office and allow 72 hours for refills to be completed.    Today you received Udenyca injection and 5FU chemotherapy pump disconnection    To help prevent nausea and vomiting after your treatment, we encourage you to take your nausea medication as directed.  BELOW ARE SYMPTOMS THAT SHOULD BE REPORTED IMMEDIATELY: *FEVER GREATER THAN 100.4 F (38 C) OR HIGHER *CHILLS OR SWEATING *NAUSEA AND VOMITING THAT IS NOT CONTROLLED WITH YOUR NAUSEA MEDICATION *UNUSUAL SHORTNESS OF BREATH *UNUSUAL BRUISING OR BLEEDING *URINARY PROBLEMS (pain or burning when urinating, or frequent urination) *BOWEL PROBLEMS (unusual diarrhea, constipation, pain near the anus) TENDERNESS IN MOUTH AND THROAT WITH OR WITHOUT PRESENCE OF ULCERS (sore throat, sores in mouth, or a toothache) UNUSUAL RASH, SWELLING OR PAIN  UNUSUAL VAGINAL DISCHARGE OR ITCHING   Items with * indicate a potential emergency and should be followed up as soon as possible or go to the Emergency Department if any problems should occur.  Please show the CHEMOTHERAPY ALERT CARD or IMMUNOTHERAPY ALERT CARD at check-in to the Emergency  Department and triage nurse.  Should you have questions after your visit or need to cancel or reschedule your appointment, please contact [facility] [phone]  and follow the prompts.  Office hours are 8:00 a.m. to 4:30 p.m. Monday - Friday. Please note that voicemails left after 4:00 p.m. may not be returned until the following business day.  We are closed weekends and major holidays. You have access to a nurse at all times for urgent questions. Please call the main number to the clinic [phone] and follow the prompts.  For any non-urgent questions, you may also contact your provider using MyChart. We now offer e-Visits for anyone 52 and older to request care online for non-urgent symptoms. For details visit [email or web address].   Also download the MyChart app! Go to the app store, search "MyChart", open the app, select [facility], and log in with your MyChart username and password.

## 2022-02-25 ENCOUNTER — Ambulatory Visit (HOSPITAL_COMMUNITY)
Admission: RE | Admit: 2022-02-25 | Discharge: 2022-02-25 | Disposition: A | Discharge status: Home / Self Care | Payer: Medicaid Other | Source: Ambulatory Visit | Attending: Hematology | Admitting: Hematology

## 2022-02-25 ENCOUNTER — Encounter (HOSPITAL_COMMUNITY): Payer: Self-pay

## 2022-02-25 DIAGNOSIS — C7989 Secondary malignant neoplasm of other specified sites: Secondary | ICD-10-CM | POA: Insufficient documentation

## 2022-02-25 DIAGNOSIS — C189 Malignant neoplasm of colon, unspecified: Secondary | ICD-10-CM

## 2022-02-25 MED ORDER — IOHEXOL 300 MG/ML  SOLN
100.0000 mL | Freq: Once | INTRAMUSCULAR | Status: AC | PRN
  Administered 2022-02-25: 100 mL via INTRAVENOUS

## 2022-02-25 MED ORDER — IOHEXOL 9 MG/ML PO SOLN
ORAL | Status: AC
  Filled 2022-02-25: qty 1000

## 2022-03-01 ENCOUNTER — Ambulatory Visit: Payer: Medicaid Other

## 2022-03-01 ENCOUNTER — Other Ambulatory Visit: Payer: Medicaid Other

## 2022-03-01 ENCOUNTER — Ambulatory Visit: Payer: Medicaid Other | Admitting: Hematology

## 2022-03-08 ENCOUNTER — Inpatient Hospital Stay: Payer: Medicaid Other

## 2022-03-08 ENCOUNTER — Encounter: Payer: Self-pay | Admitting: Hematology

## 2022-03-08 ENCOUNTER — Inpatient Hospital Stay (HOSPITAL_BASED_OUTPATIENT_CLINIC_OR_DEPARTMENT_OTHER): Payer: Medicaid Other | Admitting: Hematology

## 2022-03-08 VITALS — BP 137/86 | HR 73 | Temp 98.7°F | Resp 18

## 2022-03-08 DIAGNOSIS — Z95828 Presence of other vascular implants and grafts: Secondary | ICD-10-CM

## 2022-03-08 DIAGNOSIS — C189 Malignant neoplasm of colon, unspecified: Secondary | ICD-10-CM

## 2022-03-08 DIAGNOSIS — Z5112 Encounter for antineoplastic immunotherapy: Secondary | ICD-10-CM | POA: Diagnosis not present

## 2022-03-08 DIAGNOSIS — E876 Hypokalemia: Secondary | ICD-10-CM

## 2022-03-08 LAB — MAGNESIUM: Magnesium: 2 mg/dL (ref 1.7–2.4)

## 2022-03-08 LAB — CBC WITH DIFFERENTIAL/PLATELET
Abs Immature Granulocytes: 0.14 10*3/uL — ABNORMAL HIGH (ref 0.00–0.07)
Basophils Absolute: 0.1 10*3/uL (ref 0.0–0.1)
Basophils Relative: 1 %
Eosinophils Absolute: 0.1 10*3/uL (ref 0.0–0.5)
Eosinophils Relative: 2 %
HCT: 33.7 % — ABNORMAL LOW (ref 36.0–46.0)
Hemoglobin: 11.3 g/dL — ABNORMAL LOW (ref 12.0–15.0)
Immature Granulocytes: 2 %
Lymphocytes Relative: 41 %
Lymphs Abs: 2.5 10*3/uL (ref 0.7–4.0)
MCH: 31.4 pg (ref 26.0–34.0)
MCHC: 33.5 g/dL (ref 30.0–36.0)
MCV: 93.6 fL (ref 80.0–100.0)
Monocytes Absolute: 0.7 10*3/uL (ref 0.1–1.0)
Monocytes Relative: 12 %
Neutro Abs: 2.6 10*3/uL (ref 1.7–7.7)
Neutrophils Relative %: 42 %
Platelets: 205 10*3/uL (ref 150–400)
RBC: 3.6 MIL/uL — ABNORMAL LOW (ref 3.87–5.11)
RDW: 14.1 % (ref 11.5–15.5)
WBC: 6.2 10*3/uL (ref 4.0–10.5)
nRBC: 0 % (ref 0.0–0.2)

## 2022-03-08 LAB — COMPREHENSIVE METABOLIC PANEL
ALT: 22 U/L (ref 0–44)
AST: 19 U/L (ref 15–41)
Albumin: 3.7 g/dL (ref 3.5–5.0)
Alkaline Phosphatase: 83 U/L (ref 38–126)
BUN: 7 mg/dL (ref 6–20)
CO2: >45 mmol/L — ABNORMAL HIGH (ref 22–32)
Calcium: 8.7 mg/dL — ABNORMAL LOW (ref 8.9–10.3)
Chloride: 107 mmol/L (ref 98–111)
Creatinine, Ser: 0.69 mg/dL (ref 0.44–1.00)
GFR, Estimated: >60 mL/min (ref >60)
Glucose, Bld: 102 mg/dL — ABNORMAL HIGH (ref 70–99)
Potassium: 3.4 mmol/L — ABNORMAL LOW (ref 3.5–5.1)
Sodium: 137 mmol/L (ref 135–145)
Total Bilirubin: 0.4 mg/dL (ref 0.3–1.2)
Total Protein: 6.8 g/dL (ref 6.5–8.1)

## 2022-03-08 MED ORDER — SODIUM CHLORIDE 0.9 % IV SOLN: Freq: Once | INTRAVENOUS | Status: AC

## 2022-03-08 MED ORDER — SODIUM CHLORIDE 0.9 % IV SOLN
4450.0000 mg | INTRAVENOUS | Status: DC
  Administered 2022-03-08: 4450 mg via INTRAVENOUS
  Filled 2022-03-08: qty 89

## 2022-03-08 MED ORDER — LEUCOVORIN CALCIUM INJECTION 350 MG
370.0000 mg | Freq: Once | INTRAVENOUS | Status: AC
  Administered 2022-03-08: 370 mg via INTRAVENOUS
  Filled 2022-03-08: qty 18.5

## 2022-03-08 MED ORDER — POTASSIUM CHLORIDE CRYS ER 20 MEQ PO TBCR
40.0000 meq | EXTENDED_RELEASE_TABLET | Freq: Once | ORAL | Status: AC
  Administered 2022-03-08: 40 meq via ORAL
  Filled 2022-03-08: qty 2

## 2022-03-08 MED ORDER — SODIUM CHLORIDE 0.9 % IV SOLN
6.0000 mg/kg | Freq: Once | INTRAVENOUS | Status: AC
  Administered 2022-03-08: 400 mg via INTRAVENOUS
  Filled 2022-03-08: qty 20

## 2022-03-08 MED ORDER — ATROPINE SULFATE 1 MG/ML IV SOLN
0.5000 mg | Freq: Once | INTRAVENOUS | Status: AC
  Administered 2022-03-08: 0.5 mg via INTRAVENOUS
  Filled 2022-03-08: qty 1

## 2022-03-08 MED ORDER — FLUOROURACIL CHEMO INJECTION 2.5 GM/50ML
750.0000 mg | Freq: Once | INTRAVENOUS | Status: AC
  Administered 2022-03-08: 750 mg via INTRAVENOUS
  Filled 2022-03-08: qty 15

## 2022-03-08 MED ORDER — SODIUM CHLORIDE 0.9 % IV SOLN
150.0000 mg | Freq: Once | INTRAVENOUS | Status: AC
  Administered 2022-03-08: 150 mg via INTRAVENOUS
  Filled 2022-03-08: qty 150

## 2022-03-08 MED ORDER — SODIUM CHLORIDE 0.9% FLUSH
10.0000 mL | INTRAVENOUS | Status: AC
  Administered 2022-03-08: 10 mL

## 2022-03-08 MED ORDER — PALONOSETRON HCL INJECTION 0.25 MG/5ML
0.2500 mg | Freq: Once | INTRAVENOUS | Status: AC
  Administered 2022-03-08: 0.25 mg via INTRAVENOUS
  Filled 2022-03-08: qty 5

## 2022-03-08 MED ORDER — SODIUM CHLORIDE 0.9 % IV SOLN
10.0000 mg | Freq: Once | INTRAVENOUS | Status: AC
  Administered 2022-03-08: 10 mg via INTRAVENOUS
  Filled 2022-03-08: qty 10

## 2022-03-08 NOTE — Progress Notes (Signed)
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  PRIOR THERAPY: Sigmoid colon resection, partial small bowel resection and Hartman's procedure on 03/26/2021  NGS Results: KRAS/NRAS negative, BRAF negative, HER2 negative, MSI-stable  CURRENT THERAPY: FOLFOXIRI + Panitumumab q14d  BRIEF ONCOLOGIC HISTORY:  Oncology History  Colon carcinoma ([facility])  03/26/2021 Initial Diagnosis   Colon carcinoma ([facility])   04/06/2021 Cancer Staging   Staging form: Colon and Rectum, AJCC 8th Edition - Clinical stage from 04/06/2021: Stage IVC (cT4a, cN2b, pM1c) - Signed by Derek Jack, MD on 05/04/2021 Histopathologic type: Adenocarcinoma, NOS Stage prefix: Initial diagnosis Total positive nodes: 9 Total nodes examined: 9 Tumor deposits (TD): Present Carcinoembryonic antigen (CEA) (ng/mL): 61.5 Perineural invasion (PNI): Present Microsatellite instability (MSI): Stable KRAS mutation: Negative NRAS mutation: Negative BRAF mutation: Negative   05/17/2021 - 10/08/2021 Chemotherapy   Patient is on Treatment Plan : COLORECTAL FOLFOXIRI + Panitumumab q14d     05/17/2021 -  Chemotherapy   Patient is on Treatment Plan : COLORECTAL FOLFOXIRI q14d       CANCER STAGING:  Cancer Staging  Colon carcinoma [facility]) Staging form: Colon and Rectum, AJCC 8th Edition - Clinical stage from 04/06/2021: Stage IVC (cT4a, cN2b, pM1c) - Signed by Derek Jack, MD on 05/04/2021   INTERVAL HISTORY:  Ms. Malasha Everman, a 52 y.o. female, seen for follow-up of metastatic colon cancer and toxicity assessment prior to next cycle of chemotherapy. She was last seen by me on 01/26/22.  She states that she continues to have numbness/tingling intermittently in her bilateral feet but the  numbness in her hands has resolved. Her appetite and energy levels are 100%. She is sleeping well. She denies any pain or N/V/D. She denies any rashes or mouth sores. She did not need to take any Doxycycline.   REVIEW OF SYSTEMS:  Review of Systems  Constitutional:  Negative for appetite change, chills, fatigue and fever.  HENT:   Negative for mouth sores, nosebleeds, sore throat and trouble swallowing.   Eyes:  Negative for eye problems.  Respiratory:  Negative for cough and shortness of breath.   Cardiovascular:  Negative for chest pain, leg swelling and palpitations.  Gastrointestinal:  Negative for abdominal pain, constipation, diarrhea, nausea and vomiting.  Genitourinary:  Negative for bladder incontinence, difficulty urinating, dysuria, frequency, hematuria, nocturia and pelvic pain.   Musculoskeletal:  Negative for arthralgias, back pain and myalgias.  Skin:  Negative for itching and rash.  Neurological:  Positive for numbness (bilateral feet).  Psychiatric/Behavioral:  Negative for depression, sleep disturbance and suicidal ideas. The patient is not nervous/anxious.   All other systems reviewed and are negative.   PAST MEDICAL/SURGICAL HISTORY:  Past Medical History:  Diagnosis Date   Anemia    Back pain    Hypertension    Port-A-Cath in place 05/10/2021   Past Surgical History:  Procedure Laterality Date   BIOPSY  03/02/2021   Procedure: BIOPSY;  Surgeon: Eloise Harman, DO;  [facility];  Service: Endoscopy;;   BOWEL RESECTION N/A 03/26/2021   Procedure: SMALL BOWEL RESECTION;  Surgeon: Aviva Signs, MD;  Location: [facility] OR[facility];  Service: General;  Laterality: N/A;   CHOLECYSTECTOMY     COLONOSCOPY WITH PROPOFOL N/A 10/04/2021   Procedure: COLONOSCOPY WITH PROPOFOL;  Surgeon: Eloise Harman, DO;  Location: [facility];  Service: Endoscopy;  Laterality: N/A;  12:15PM, VIA OSTOMY   COLOSTOMY N/A 03/26/2021   Procedure: COLOSTOMY;  Surgeon: Aviva Signs, MD;   Location: [facility];  Service: General;  Laterality: N/A;   ESOPHAGOGASTRODUODENOSCOPY (EGD) WITH PROPOFOL N/A 03/02/2021   Procedure: ESOPHAGOGASTRODUODENOSCOPY (EGD) WITH PROPOFOL;  Surgeon: Eloise Harman, DO;  [facility];  Service: Endoscopy;  Laterality: N/A;   PARTIAL COLECTOMY N/A 03/26/2021   Procedure: PARTIAL COLECTOMY;  Surgeon: Aviva Signs, MD;  Location: [facility];  Service: General;  Laterality: N/A;   PORTACATH PLACEMENT Left 04/14/2021   Procedure: INSERTION PORT-A-CATH;  Surgeon: Aviva Signs, MD;  Location: [facility];  Service: General;  Laterality: Left;   SCLEROTHERAPY  03/02/2021   Procedure: Clide Deutscher;  Surgeon: Eloise Harman, DO;  Location[facility];  Service: Endoscopy;;   SIGMOIDOSCOPY  03/02/2021   Procedure: Lonell Face;  Surgeon: Eloise Harman, DO;  [facility];  Service: Endoscopy;;    SOCIAL HISTORY:  Social History   Socioeconomic History   Marital status: Married    Spouse name: Not on file   Number of children: Not on file   Years of education: Not on file   Highest education level: Not on file  Occupational History   Not on file  Tobacco Use   Smoking status: Never   Smokeless tobacco: Never  Vaping Use   Vaping Use: Never used  Substance and Sexual Activity   Alcohol use: No   Drug use: No   Sexual activity: Not on file  Other Topics Concern   Not on file  Social History Narrative   Not on file   Social Determinants of Health   Financial Resource Strain: High Risk (05/19/2021)   Overall Financial Resource Strain (CARDIA)    Difficulty of Paying Living Expenses: Hard  Food Insecurity: Not on file  Transportation Needs: Not on file  Physical Activity: Not on file  Stress: Not on file  Social Connections: Not on file  Intimate Partner Violence: Not on file    FAMILY HISTORY:  Family History  Problem Relation Age of Onset   Hypertension Mother    Hypertension Father    Colon cancer Neg Hx     Colon polyps Neg Hx    Inflammatory bowel disease Neg Hx     CURRENT MEDICATIONS:  Current Outpatient Medications  Medication Sig Dispense Refill   amoxicillin-clavulanate (AUGMENTIN) 875-125 MG tablet Take 1 tablet by mouth 2 (two) times daily.     ferrous sulfate 325 (65 FE) MG tablet TAKE 325 MG BY MOUTH DAILY. (Patient taking differently: 325 mg daily with breakfast.) 90 tablet 1   fexofenadine (ALLEGRA) 180 MG tablet Take 1 tablet (180 mg total) by mouth daily. 30 tablet 0   fluorouracil CALGB 29562 2,400 mg/m2 in sodium chloride 0.9 % 150 mL Inject 2,400 mg/m2 into the vein over 48 hr. Every 14 days     FLUOROURACIL IV Inject into the vein every 14 (fourteen) days.     fluticasone (FLONASE) 50 MCG/ACT nasal spray Place 2 sprays into both nostrils daily. 16 g 12   hydrocortisone 1 % lotion Apply 1 application. topically 2 (two) times daily. 118 mL 5   lidocaine (XYLOCAINE) 2 % solution Use as directed 10 mLs in the mouth or throat 4 (four) times daily as needed for mouth pain (Mix 1:1 with carafate and swish and spit). 100 mL ML  magnesium oxide (MAG-OX) 400 (240 Mg) MG tablet Take 1 tablet (400 mg total) by mouth 2 (two) times daily. 60 tablet 6   medroxyPROGESTERone (DEPO-PROVERA) 150 MG/ML injection Inject 150 mg into the muscle every 3 (three) months.     metroNIDAZOLE (METROGEL) 0.75 % vaginal gel Place vaginally at bedtime.     Multiple Vitamin (MULTIVITAMIN) tablet Take 1 tablet by mouth daily.     nebivolol (BYSTOLIC) 10 MG tablet Take 1 tablet (10 mg total) by mouth daily. 30 tablet 1   OXALIPLATIN IV Inject into the vein every 14 (fourteen) days.     Panitumumab (VECTIBIX IV) Inject into the vein every 14 (fourteen) days.     pantoprazole (PROTONIX) 40 MG tablet Take 40 mg by mouth daily as needed (acid reflux).     potassium chloride SA (KLOR-CON M20) 20 MEQ tablet Take 1 tablet (20 mEq total) by mouth in the morning, at noon, in the evening, and at bedtime. 120 tablet 3    sucralfate (CARAFATE) 1 GM/10ML suspension Take 10 mLs (1 g total) by mouth 4 (four) times daily as needed. Mix 1:1 with 2% lidocaine viscous solution and swish and spit 420 mL 6   No current facility-administered medications for this visit.    ALLERGIES:  Allergies  Allergen Reactions   Zithromax [Azithromycin] Hives    infusing arm began to swell , her face got red, and she broke out, there are bumps around where the IV site was infusing   Motrin [Ibuprofen] Itching   Sudafed [Pseudoephedrine Hcl] Other (See Comments)    Hypertension   Zestril [Lisinopril] Cough    PHYSICAL EXAM:  Performance status (ECOG): 1 - Symptomatic but completely ambulatory  There were no vitals filed for this visit.  Wt Readings from Last 3 Encounters:  03/08/22 74.9 kg (165 lb 3.2 oz)  02/22/22 74.4 kg (164 lb)  01/26/22 70.8 kg (156 lb)   Physical Exam Vitals reviewed.  Constitutional:      Appearance: Normal appearance.  Cardiovascular:     Rate and Rhythm: Normal rate and regular rhythm.     Pulses: Normal pulses.     Heart sounds: Normal heart sounds.  Pulmonary:     Effort: Pulmonary effort is normal.     Breath sounds: Normal breath sounds.  Skin:    Findings: No rash.  Neurological:     General: No focal deficit present.     Mental Status: She is alert and oriented to person, place, and time.  Psychiatric:        Mood and Affect: Mood normal.        Behavior: Behavior normal.     LABORATORY DATA:  I have reviewed the labs as listed.     Latest Ref Rng & Units 03/08/2022    8:17 AM 02/22/2022    8:10 AM 01/26/2022    8:12 AM  CBC  WBC 4.0 - 10.5 K/uL 6.2  5.4  5.0   Hemoglobin 12.0 - 15.0 g/dL 11.3  11.4  11.1   Hematocrit 36.0 - 46.0 % 33.7  34.8  33.1   Platelets 150 - 400 K/uL 205  267  284       Latest Ref Rng & Units 02/22/2022    8:10 AM 01/26/2022    8:12 AM 01/03/2022    8:32 AM  CMP  Glucose 70 - 99 mg/dL 95  102  127   BUN 6 - 20 mg/dL '12  8  8   '$ Creatinine  0.44 - 1.00 mg/dL 0.74  0.68  0.65   Sodium 135 - 145 mmol/L 137  141  139   Potassium 3.5 - 5.1 mmol/L 3.9  3.8  3.4   Chloride 98 - 111 mmol/L 105  114  113   CO2 22 - 32 mmol/L '23  21  20   '$ Calcium 8.9 - 10.3 mg/dL 8.5  8.9  8.5   Total Protein 6.5 - 8.1 g/dL 6.9  7.0  6.4   Total Bilirubin 0.3 - 1.2 mg/dL 0.5  0.5  0.4   Alkaline Phos 38 - 126 U/L 60  60  62   AST 15 - 41 U/L '18  16  23   '$ ALT 0 - 44 U/L '15  12  20     '$ DIAGNOSTIC IMAGING:  I have independently reviewed the scans and discussed with the patient. CT CHEST ABDOMEN PELVIS W CONTRAST  Result Date: 02/26/2022 CLINICAL DATA:  52 year old female with colorectal carcinoma. Stage IV colorectal carcinoma status post partial colectomy and chemotherapy. * Tracking Code: BO * EXAM: CT CHEST, ABDOMEN, AND PELVIS WITH CONTRAST TECHNIQUE: Multidetector CT imaging of the chest, abdomen and pelvis was performed following the standard protocol during bolus administration of intravenous contrast. RADIATION DOSE REDUCTION: This exam was performed according to the departmental dose-optimization program which includes automated exposure control, adjustment of the mA and/or kV according to patient size and/or use of iterative reconstruction technique. CONTRAST:  180m OMNIPAQUE IOHEXOL 300 MG/ML  SOLN COMPARISON:  CT 11/15/2021 FINDINGS: CT CHEST FINDINGS Cardiovascular: Port in the anterior chest wall with tip in distal SVC. No significant vascular findings. Normal heart size. No pericardial effusion. Mediastinum/Nodes: No axillary or supraclavicular adenopathy. No mediastinal or hilar adenopathy. No pericardial fluid. Esophagus normal. Lungs/Pleura: No suspicious pulmonary nodules. Normal pleural. Airways normal. Musculoskeletal: No aggressive osseous lesion. CT ABDOMEN AND PELVIS FINDINGS Hepatobiliary: No focal hepatic lesion. Postcholecystectomy. No biliary dilatation. Pancreas: Pancreas is normal. No ductal dilatation. No pancreatic inflammation.  Spleen: Normal spleen Adrenals/urinary tract: Adrenal glands normal. Kidneys, ureters, and bladder normal. Stomach/Bowel: Stomach, duodenum small-bowel normal. Terminal ileum normal. Ascending and transverse colon normal. LEFT abdominal wall colostomy. No evidence obstruction. Post sigmoid colectomy.  Hartmann's pouch unchanged. Previous described small nodule in the root of the sigmoid mesocolon is unchanged measuring 7 mm (image 71/2). No new mesenteric lymphadenopathy. Vascular/Lymphatic: Abdominal aorta is normal caliber. There is no retroperitoneal or periportal lymphadenopathy. No pelvic lymphadenopathy. Reproductive: Large soft tissue and calcified uterine leiomyomata again demonstrated. Uterus arises out of the pelvis. No change in uterus compared to prior. No adnexal abnormality. Other: No peritoneal metastasis. Musculoskeletal: No aggressive osseous lesion. IMPRESSION: CHEST IMPRESSION: No evidence of thoracic metastasis. PELVIS IMPRESSION: 1. No evidence of colorectal carcinoma recurrence or metastasis in the abdomen pelvis. 2. Stable small nodule in the root of the sigmoid mesocolon. 3. Post sigmoid colectomy with LEFT abdominal wall colostomy. 4. Leiomyomatous uterus. Electronically Signed   By: SSuzy BouchardM.D.   On: 02/26/2022 09:51     ASSESSMENT:  PT4PN2B N1C stage IV sigmoid colon cancer, NRAS negative: - Colonoscopy on 03/02/2021: Nonbleeding internal hemorrhoids, severe stenosis in the sigmoid colon at approximately 18 cm from anal verge, unable to be traversed with pediatric colonoscopy.  Biopsy consistent with moderately to poorly differentiated adenocarcinoma. - CTAP with contrast on 01/10/2021: Diffusely thickened, 10 cm segment of distal sigmoid colon with adjacent moderate inflammatory reaction, suspected 2 sites of small bowel fistulization to the diseased segment and a rim-enhancing low-density  2.5 x 1.8 cm stricture medial to the mid segment which could represent a necrotic lymph  node or contained perforation/abscess.  There are bilateral mildly enlarged common iliac chain nodes.  Mildly dilated mid to lower abdominal small bowel segments.  Mildly prominent slightly steatotic liver. - Sigmoid colon resection, partial small bowel resection and Hartman's procedure on 03/26/2021: Moderately to poorly differentiated adenocarcinoma involving the subserosal adipose tissue and present at the margin.  9/9 lymph nodes positive for metastatic carcinoma.  Small bowel resection is positive for adenocarcinoma nodules in the serosal fat.  PT4PN2B.  MMR is preserved. - Operative report mentions obvious extension of the tumor outside the colon to the pelvic brim, unable to fully excise all of the tumor in the pelvis. - NGS testing: KRAS/NRAS negative, BRAF negative, HER2 negative, MSI-stable - PET scan on 04/29/2021: Multifocal hypermetabolic nodule, peritoneal and anterior abdominal wall adenopathy.  Findings worrisome for peritoneal carcinomatosis.  No evidence of hepatic metastatic disease or distant mets. - FOLFOXIRI and Vectibix started on 05/17/2021 - We will see how she responds and will refer to Dr. Crisoforo Oxford for resection/HIPEC therapy.  Colonoscopy through stoma and rectal stump on 10/04/2021 was negative.    Social/family history: - She lives at home with her family.  She works as a Quarry manager at [employer].  Non-smoker. - Paternal cousin had cancer.  Maternal aunt had cancer.   PLAN:  Stage IV sigmoid colon adenocarcinoma, MSI-stable, RAS/BRAF negative: - She is tolerating FOLFIRI and Vectibix very well. - We reviewed CT CAP from 02/25/2022.  No evidence of recurrence or metastatic disease.  Stable small nodule in the root of the sigmoid mesocolon. - Last CEA 6.7 on 02/22/2022. - We reviewed labs today which showed normal LFTs.  CBC was grossly normal. - I have recommended that we discontinue Irinotecan and start on maintenance 5-FU and Vectibix every 2 weeks.  RTC 6 weeks for  follow-up.  2.  Hypokalemia: - Continue potassium 20 mEq 4 times daily.  Potassium today is 3.4.  3.  Peripheral neuropathy: - Oxaliplatin was discontinued on 12/14/2021. - She has on and off numbness in the feet which is stable.  4.  Microcytic anemia: - Continue iron tablet daily.  Hemoglobin is 11.3.  5.  Acneform rash: - She does not have any acneform rash.  Restart doxycycline if she develops rash.   Orders placed this encounter:  Orders Placed This Encounter  Procedures   CBC with Differential   Comprehensive metabolic panel   CBC with Differential   Comprehensive metabolic panel   CBC with Differential   Comprehensive metabolic panel   CBC with Differential   Comprehensive metabolic panel   CBC with Differential   Comprehensive metabolic panel   CBC with Differential   Comprehensive metabolic panel     I,Alexis Herring,acting as a scribe for Derek Jack, MD.,have documented all relevant documentation on the behalf of Derek Jack, MD,as directed by  Derek Jack, MD while in the presence of Derek Jack, MD.  I, Derek Jack MD, have reviewed the above documentation for accuracy and completeness, and I agree with the above.   Derek Jack, MD [place] [phone]

## 2022-03-08 NOTE — Patient Instructions (Addendum)
[place] at [facility] Discharge Instructions   You were seen and examined today by Dr. Delton Coombes.  He reviewed the results of your lab work which are normal/stable.   He reviewed the results of your CT scan which is normal/stable.   We will proceed with your treatment today. We will discontinue the irinotecan. You will continue to get 5 FU and Vectibix.   Return as scheduled.    Thank you for choosing [place] at [facility] to provide your oncology and hematology care.  To afford each patient quality time with our provider, please arrive at least 15 minutes before your scheduled appointment time.   If you have a lab appointment with the [facility] please come in thru the Main Entrance and check in at the main information desk.  You need to re-schedule your appointment should you arrive 10 or more minutes late.  We strive to give you quality time with our providers, and arriving late affects you and other patients whose appointments are after yours.  Also, if you no show three or more times for appointments you may be dismissed from the clinic at the providers discretion.     Again, thank you for choosing [facility].  Our hope is that these requests will decrease the amount of time that you wait before being seen by our physicians.       _____________________________________________________________  Should you have questions after your visit to [facility], please contact our office at [phone] and follow the prompts.  Our office hours are 8:00 a.m. and 4:30 p.m. Monday - Friday.  Please note that voicemails left after 4:00 p.m. may not be returned until the following business day.  We are closed weekends and major holidays.  You do have access to a nurse 24-7, just call the main number to the clinic [phone] and do not press any options, hold on the line and a nurse will answer the phone.    For  prescription refill requests, have your pharmacy contact our office and allow 72 hours.    Due to Covid, you will need to wear a mask upon entering the hospital. If you do not have a mask, a mask will be given to you at the Main Entrance upon arrival. For doctor visits, patients may have 1 support person age 67 or older with them. For treatment visits, patients can not have anyone with them due to social distancing guidelines and our immunocompromised population.

## 2022-03-08 NOTE — Patient Instructions (Signed)
[facility]  Discharge Instructions: Thank you for choosing [facility] to provide your oncology and hematology care.  If you have a lab appointment with the [facility], please come in thru the Main Entrance and check in at the main information desk.  Wear comfortable clothing and clothing appropriate for easy access to any Portacath or PICC line.   We strive to give you quality time with your provider. You may need to reschedule your appointment if you arrive late (15 or more minutes).  Arriving late affects you and other patients whose appointments are after yours.  Also, if you miss three or more appointments without notifying the office, you may be dismissed from the clinic at the provider's discretion.      For prescription refill requests, have your pharmacy contact our office and allow 72 hours for refills to be completed.    Today you received the following chemotherapy and/or immunotherapy agents Vectibix/Leucovorin/5FU.  Panitumumab Injection What is this medication? PANITUMUMAB (pan i TOOM ue mab) treats colorectal cancer. It works by blocking a protein that causes cancer cells to grow and multiply. This helps to slow or stop the spread of cancer cells. It is a monoclonal antibody. This medicine may be used for other purposes; ask your health care provider or pharmacist if you have questions. COMMON BRAND NAME(S): Vectibix What should I tell my care team before I take this medication? They need to know if you have any of these conditions: Eye disease Low levels of magnesium in the blood Lung disease An unusual or allergic reaction to panitumumab, other medications, foods, dyes, or preservatives Pregnant or trying to get pregnant Breast-feeding How should I use this medication? This medication is injected into a vein. It is given by your care team in a hospital or clinic setting. Talk to your care team about the use of this medication in  children. Special care may be needed. Overdosage: If you think you have taken too much of this medicine contact a poison control center or emergency room at once. NOTE: This medicine is only for you. Do not share this medicine with others. What if I miss a dose? Keep appointments for follow-up doses. It is important not to miss your dose. Call your care team if you are unable to keep an appointment. What may interact with this medication? Bevacizumab This list may not describe all possible interactions. Give your health care provider a list of all the medicines, herbs, non-prescription drugs, or dietary supplements you use. Also tell them if you smoke, drink alcohol, or use illegal drugs. Some items may interact with your medicine. What should I watch for while using this medication? Your condition will be monitored carefully while you are receiving this medication. This medication may make you feel generally unwell. This is not uncommon as chemotherapy can affect healthy cells as well as cancer cells. Report any side effects. Continue your course of treatment even though you feel ill unless your care team tells you to stop. This medication can make you more sensitive to the sun. Keep out of the sun while receiving this medication and for 2 months after stopping therapy. If you cannot avoid being in the sun, wear protective clothing and sunscreen. Do not use sun lamps, tanning beds, or tanning booths. Check with your care team if you have severe diarrhea, nausea, and vomiting or if you sweat a lot. The loss of too much body fluid may make it dangerous for  you to take this medication. This medication may cause serious skin reactions. They can happen weeks to months after starting the medication. Contact your care team right away if you notice fevers or flu-like symptoms with a rash. The rash may be red or purple and then turn into blisters or peeling of the skin. You may also notice a red rash with  swelling of the face, lips, or lymph nodes in your neck or under your arms. Talk to your care team if you may be pregnant. Serious birth defects can occur if you take this medication during pregnancy and for 2 months after the last dose. Contraception is recommended while taking this medication and for 2 months after the last dose. Your care team can help you find the option that works for you. Do not breastfeed while taking this medication and for 2 months after the last dose. This medication may cause infertility. Talk to your care team if you are concerned about your fertility. What side effects may I notice from receiving this medication? Side effects that you should report to your care team as soon as possible: Allergic reactions--skin rash, itching, hives, swelling of the face, lips, tongue, or throat Dry cough, shortness of breath or trouble breathing Eye pain, redness, irritation, or discharge with blurry or decreased vision Infusion reactions--chest pain, shortness of breath or trouble breathing, feeling faint or lightheaded Low magnesium level--muscle pain or cramps, unusual weakness or fatigue, fast or irregular heartbeat, tremors Low potassium level--muscle pain or cramps, unusual weakness or fatigue, fast or irregular heartbeat, constipation Redness, blistering, peeling, or loosening of the skin, including inside the mouth Skin reactions on sun-exposed areas Side effects that usually do not require medical attention (report to your care team if they continue or are bothersome): Change in nail shape, thickness, or color Diarrhea Dry skin Fatigue Nausea Vomiting This list may not describe all possible side effects. Call your doctor for medical advice about side effects. You may report side effects to FDA at 1-800-FDA-1088. Where should I keep my medication? This medication is given in a hospital or clinic. It will not be stored at home. NOTE: This sheet is a summary. It may not  cover all possible information. If you have questions about this medicine, talk to your doctor, pharmacist, or health care provider.  2023 Elsevier/Gold Standard (2021-06-09 00:00:00)    Leucovorin Injection What is this medication? LEUCOVORIN (loo koe VOR in) prevents side effects from certain medications, such as methotrexate. It works by increasing folate levels. This helps protect healthy cells in your body. It may also be used to treat anemia caused by low levels of folate. It can also be used with fluorouracil, a type of chemotherapy, to treat colorectal cancer. It works by increasing the effects of fluorouracil in the body. This medicine may be used for other purposes; ask your health care provider or pharmacist if you have questions. What should I tell my care team before I take this medication? They need to know if you have any of these conditions: Anemia from low levels of vitamin B12 in the blood An unusual or allergic reaction to leucovorin, folic acid, other medications, foods, dyes, or preservatives Pregnant or trying to get pregnant Breastfeeding How should I use this medication? This medication is injected into a vein or a muscle. It is given by your care team in a hospital or clinic setting. Talk to your care team about the use of this medication in children. Special care may be  needed. Overdosage: If you think you have taken too much of this medicine contact a poison control center or emergency room at once. NOTE: This medicine is only for you. Do not share this medicine with others. What if I miss a dose? Keep appointments for follow-up doses. It is important not to miss your dose. Call your care team if you are unable to keep an appointment. What may interact with this medication? Capecitabine Fluorouracil Phenobarbital Phenytoin Primidone Trimethoprim;sulfamethoxazole This list may not describe all possible interactions. Give your health care provider a list of all  the medicines, herbs, non-prescription drugs, or dietary supplements you use. Also tell them if you smoke, drink alcohol, or use illegal drugs. Some items may interact with your medicine. What should I watch for while using this medication? Your condition will be monitored carefully while you are receiving this medication. This medication may increase the side effects of 5-fluorouracil. Tell your care team if you have diarrhea or mouth sores that do not get better or that get worse. What side effects may I notice from receiving this medication? Side effects that you should report to your care team as soon as possible: Allergic reactions--skin rash, itching, hives, swelling of the face, lips, tongue, or throat This list may not describe all possible side effects. Call your doctor for medical advice about side effects. You may report side effects to FDA at 1-800-FDA-1088. Where should I keep my medication? This medication is given in a hospital or clinic. It will not be stored at home. NOTE: This sheet is a summary. It may not cover all possible information. If you have questions about this medicine, talk to your doctor, pharmacist, or health care provider.  2023 Elsevier/Gold Standard (2021-06-29 00:00:00)    Fluorouracil Injection What is this medication? FLUOROURACIL (flure oh YOOR a sil) treats some types of cancer. It works by slowing down the growth of cancer cells. This medicine may be used for other purposes; ask your health care provider or pharmacist if you have questions. COMMON BRAND NAME(S): Adrucil What should I tell my care team before I take this medication? They need to know if you have any of these conditions: Blood disorders Dihydropyrimidine dehydrogenase (DPD) deficiency Infection, such as chickenpox, cold sores, herpes Kidney disease Liver disease Poor nutrition Recent or ongoing radiation therapy An unusual or allergic reaction to fluorouracil, other medications,  foods, dyes, or preservatives If you or your partner are pregnant or trying to get pregnant Breast-feeding How should I use this medication? This medication is injected into a vein. It is administered by your care team in a hospital or clinic setting. Talk to your care team about the use of this medication in children. Special care may be needed. Overdosage: If you think you have taken too much of this medicine contact a poison control center or emergency room at once. NOTE: This medicine is only for you. Do not share this medicine with others. What if I miss a dose? Keep appointments for follow-up doses. It is important not to miss your dose. Call your care team if you are unable to keep an appointment. What may interact with this medication? Do not take this medication with any of the following: Live virus vaccines This medication may also interact with the following: Medications that treat or prevent blood clots, such as warfarin, enoxaparin, dalteparin This list may not describe all possible interactions. Give your health care provider a list of all the medicines, herbs, non-prescription drugs, or dietary supplements  you use. Also tell them if you smoke, drink alcohol, or use illegal drugs. Some items may interact with your medicine. What should I watch for while using this medication? Your condition will be monitored carefully while you are receiving this medication. This medication may make you feel generally unwell. This is not uncommon as chemotherapy can affect healthy cells as well as cancer cells. Report any side effects. Continue your course of treatment even though you feel ill unless your care team tells you to stop. In some cases, you may be given additional medications to help with side effects. Follow all directions for their use. This medication may increase your risk of getting an infection. Call your care team for advice if you get a fever, chills, sore throat, or other  symptoms of a cold or flu. Do not treat yourself. Try to avoid being around people who are sick. This medication may increase your risk to bruise or bleed. Call your care team if you notice any unusual bleeding. Be careful brushing or flossing your teeth or using a toothpick because you may get an infection or bleed more easily. If you have any dental work done, tell your dentist you are receiving this medication. Avoid taking medications that contain aspirin, acetaminophen, ibuprofen, naproxen, or ketoprofen unless instructed by your care team. These medications may hide a fever. Do not treat diarrhea with over the counter products. Contact your care team if you have diarrhea that lasts more than 2 days or if it is severe and watery. This medication can make you more sensitive to the sun. Keep out of the sun. If you cannot avoid being in the sun, wear protective clothing and sunscreen. Do not use sun lamps, tanning beds, or tanning booths. Talk to your care team if you or your partner wish to become pregnant or think you might be pregnant. This medication can cause serious birth defects if taken during pregnancy and for 3 months after the last dose. A reliable form of contraception is recommended while taking this medication and for 3 months after the last dose. Talk to your care team about effective forms of contraception. Do not father a child while taking this medication and for 3 months after the last dose. Use a condom while having sex during this time period. Do not breastfeed while taking this medication. This medication may cause infertility. Talk to your care team if you are concerned about your fertility. What side effects may I notice from receiving this medication? Side effects that you should report to your care team as soon as possible: Allergic reactions--skin rash, itching, hives, swelling of the face, lips, tongue, or throat Heart attack--pain or tightness in the chest, shoulders, arms,  or jaw, nausea, shortness of breath, cold or clammy skin, feeling faint or lightheaded Heart failure--shortness of breath, swelling of the ankles, feet, or hands, sudden weight gain, unusual weakness or fatigue Heart rhythm changes--fast or irregular heartbeat, dizziness, feeling faint or lightheaded, chest pain, trouble breathing High ammonia level--unusual weakness or fatigue, confusion, loss of appetite, nausea, vomiting, seizures Infection--fever, chills, cough, sore throat, wounds that don't heal, pain or trouble when passing urine, general feeling of discomfort or being unwell Low red blood cell level--unusual weakness or fatigue, dizziness, headache, trouble breathing Pain, tingling, or numbness in the hands or feet, muscle weakness, change in vision, confusion or trouble speaking, loss of balance or coordination, trouble walking, seizures Redness, swelling, and blistering of the skin over hands and feet Severe or prolonged  diarrhea Unusual bruising or bleeding Side effects that usually do not require medical attention (report to your care team if they continue or are bothersome): Dry skin Headache Increased tears Nausea Pain, redness, or swelling with sores inside the mouth or throat Sensitivity to light Vomiting This list may not describe all possible side effects. Call your doctor for medical advice about side effects. You may report side effects to FDA at 1-800-FDA-1088. Where should I keep my medication? This medication is given in a hospital or clinic. It will not be stored at home. NOTE: This sheet is a summary. It may not cover all possible information. If you have questions about this medicine, talk to your doctor, pharmacist, or health care provider.  2023 Elsevier/Gold Standard (2021-05-25 00:00:00)        To help prevent nausea and vomiting after your treatment, we encourage you to take your nausea medication as directed.  BELOW ARE SYMPTOMS THAT SHOULD BE REPORTED  IMMEDIATELY: *FEVER GREATER THAN 100.4 F (38 C) OR HIGHER *CHILLS OR SWEATING *NAUSEA AND VOMITING THAT IS NOT CONTROLLED WITH YOUR NAUSEA MEDICATION *UNUSUAL SHORTNESS OF BREATH *UNUSUAL BRUISING OR BLEEDING *URINARY PROBLEMS (pain or burning when urinating, or frequent urination) *BOWEL PROBLEMS (unusual diarrhea, constipation, pain near the anus) TENDERNESS IN MOUTH AND THROAT WITH OR WITHOUT PRESENCE OF ULCERS (sore throat, sores in mouth, or a toothache) UNUSUAL RASH, SWELLING OR PAIN  UNUSUAL VAGINAL DISCHARGE OR ITCHING   Items with * indicate a potential emergency and should be followed up as soon as possible or go to the Emergency Department if any problems should occur.  Please show the CHEMOTHERAPY ALERT CARD or IMMUNOTHERAPY ALERT CARD at check-in to the Emergency Department and triage nurse.  Should you have questions after your visit or need to cancel or reschedule your appointment, please contact [facility] [phone]  and follow the prompts.  Office hours are 8:00 a.m. to 4:30 p.m. Monday - Friday. Please note that voicemails left after 4:00 p.m. may not be returned until the following business day.  We are closed weekends and major holidays. You have access to a nurse at all times for urgent questions. Please call the main number to the clinic [phone] and follow the prompts.  For any non-urgent questions, you may also contact your provider using MyChart. We now offer e-Visits for anyone 39 and older to request care online for non-urgent symptoms. For details visit [email or web address].   Also download the MyChart app! Go to the app store, search "MyChart", open the app, select , and log in with your MyChart username and password.

## 2022-03-08 NOTE — Progress Notes (Signed)
Patient presents today for chemotherapy infusion.  Patient is in satisfactory condition with no complaints voiced.  Vital signs are stable.  Labs reviewed by Dr. Delton Coombes during her office visit.  All labs are within treatment parameters.  Potassium today is 3.4.  We will give Klor Con 40 mEq PO x one dose today per standing orders by Dr. Delton Coombes.  We will proceed with treatment per MD orders.   Patient tolerated treatment well with no complaints voiced.  Home infusion 5FU pump connected with no issues.  Patient left ambulatory in stable condition.  Vital signs stable at discharge.  Follow up as scheduled.

## 2022-03-09 ENCOUNTER — Other Ambulatory Visit: Payer: Self-pay

## 2022-03-10 ENCOUNTER — Inpatient Hospital Stay: Payer: Medicaid Other | Attending: Hematology

## 2022-03-10 VITALS — BP 130/81 | HR 77 | Temp 98.7°F | Resp 18

## 2022-03-10 DIAGNOSIS — Z5112 Encounter for antineoplastic immunotherapy: Secondary | ICD-10-CM | POA: Diagnosis present

## 2022-03-10 DIAGNOSIS — C189 Malignant neoplasm of colon, unspecified: Secondary | ICD-10-CM | POA: Insufficient documentation

## 2022-03-10 DIAGNOSIS — Z5111 Encounter for antineoplastic chemotherapy: Secondary | ICD-10-CM | POA: Diagnosis not present

## 2022-03-10 DIAGNOSIS — Z95828 Presence of other vascular implants and grafts: Secondary | ICD-10-CM

## 2022-03-10 LAB — CEA: CEA: 6 ng/mL — ABNORMAL HIGH (ref 0.0–4.7)

## 2022-03-10 MED ORDER — SODIUM CHLORIDE 0.9% FLUSH
10.0000 mL | INTRAVENOUS | Status: DC | PRN
  Administered 2022-03-10: 10 mL

## 2022-03-10 MED ORDER — PEGFILGRASTIM-CBQV 6 MG/0.6ML ~~LOC~~ SOSY: 6.0000 mg | PREFILLED_SYRINGE | Freq: Once | SUBCUTANEOUS | Status: DC

## 2022-03-10 MED ORDER — HEPARIN SOD (PORK) LOCK FLUSH 100 UNIT/ML IV SOLN
500.0000 [IU] | Freq: Once | INTRAVENOUS | Status: AC | PRN
  Administered 2022-03-10: 500 [IU]

## 2022-03-10 NOTE — Patient Instructions (Signed)
[facility]  Discharge Instructions: Thank you for choosing [facility] to provide your oncology and hematology care.  If you have a lab appointment with the Cancer Center, please come in thru the Main Entrance and check in at the main information desk.  Wear comfortable clothing and clothing appropriate for easy access to any Portacath or PICC line.   We strive to give you quality time with your provider. You may need to reschedule your appointment if you arrive late (15 or more minutes).  Arriving late affects you and other patients whose appointments are after yours.  Also, if you miss three or more appointments without notifying the office, you may be dismissed from the clinic at the provider's discretion.      For prescription refill requests, have your pharmacy contact our office and allow 72 hours for refills to be completed.    Today you received the following chemotherapy and/or immunotherapy agents Pump stop      To help prevent nausea and vomiting after your treatment, we encourage you to take your nausea medication as directed.  BELOW ARE SYMPTOMS THAT SHOULD BE REPORTED IMMEDIATELY: *FEVER GREATER THAN 100.4 F (38 C) OR HIGHER *CHILLS OR SWEATING *NAUSEA AND VOMITING THAT IS NOT CONTROLLED WITH YOUR NAUSEA MEDICATION *UNUSUAL SHORTNESS OF BREATH *UNUSUAL BRUISING OR BLEEDING *URINARY PROBLEMS (pain or burning when urinating, or frequent urination) *BOWEL PROBLEMS (unusual diarrhea, constipation, pain near the anus) TENDERNESS IN MOUTH AND THROAT WITH OR WITHOUT PRESENCE OF ULCERS (sore throat, sores in mouth, or a toothache) UNUSUAL RASH, SWELLING OR PAIN  UNUSUAL VAGINAL DISCHARGE OR ITCHING   Items with * indicate a potential emergency and should be followed up as soon as possible or go to the Emergency Department if any problems should occur.  Please show the CHEMOTHERAPY ALERT CARD or IMMUNOTHERAPY ALERT CARD at check-in to the  Emergency Department and triage nurse.  Should you have questions after your visit or need to cancel or reschedule your appointment, please contact [facility] [phone]  and follow the prompts.  Office hours are 8:00 a.m. to 4:30 p.m. Monday - Friday. Please note that voicemails left after 4:00 p.m. may not be returned until the following business day.  We are closed weekends and major holidays. You have access to a nurse at all times for urgent questions. Please call the main number to the clinic [phone] and follow the prompts.  For any non-urgent questions, you may also contact your provider using MyChart. We now offer e-Visits for anyone 18 and older to request care online for non-urgent symptoms. For details visit [email or web address].   Also download the MyChart app! Go to the app store, search "MyChart", open the app, select [facility], and log in with your MyChart username and password.   

## 2022-03-10 NOTE — Progress Notes (Signed)
Patient presents today for 5FU pump stop and disconnection after 46 hour continous infusion.   5FU pump deaccessed.  Patients port flushed without difficulty.  Good blood return noted with no bruising or swelling noted at site.  Needle removed intact.  Band aid applied.  VSS with discharge and left in satisfactory condition via wheelchair with no s/s of distress noted.

## 2022-03-10 NOTE — Progress Notes (Signed)
DC [facility] since no longer receiving irinotecan.  T.O. Dr Rhys Martini, PharmD

## 2022-03-19 ENCOUNTER — Other Ambulatory Visit: Payer: Self-pay

## 2022-03-22 ENCOUNTER — Other Ambulatory Visit: Payer: Self-pay | Admitting: *Deleted

## 2022-03-22 ENCOUNTER — Inpatient Hospital Stay: Payer: Medicaid Other

## 2022-03-22 VITALS — BP 133/83 | HR 88 | Temp 97.2°F | Resp 18

## 2022-03-22 DIAGNOSIS — Z95828 Presence of other vascular implants and grafts: Secondary | ICD-10-CM

## 2022-03-22 DIAGNOSIS — C189 Malignant neoplasm of colon, unspecified: Secondary | ICD-10-CM

## 2022-03-22 DIAGNOSIS — Z5112 Encounter for antineoplastic immunotherapy: Secondary | ICD-10-CM | POA: Diagnosis not present

## 2022-03-22 LAB — CBC WITH DIFFERENTIAL/PLATELET
Abs Immature Granulocytes: 0.01 10*3/uL (ref 0.00–0.07)
Basophils Absolute: 0.1 10*3/uL (ref 0.0–0.1)
Basophils Relative: 1 %
Eosinophils Absolute: 0.1 10*3/uL (ref 0.0–0.5)
Eosinophils Relative: 2 %
HCT: 34.2 % — ABNORMAL LOW (ref 36.0–46.0)
Hemoglobin: 11.4 g/dL — ABNORMAL LOW (ref 12.0–15.0)
Immature Granulocytes: 0 %
Lymphocytes Relative: 40 %
Lymphs Abs: 2 10*3/uL (ref 0.7–4.0)
MCH: 30.8 pg (ref 26.0–34.0)
MCHC: 33.3 g/dL (ref 30.0–36.0)
MCV: 92.4 fL (ref 80.0–100.0)
Monocytes Absolute: 0.5 10*3/uL (ref 0.1–1.0)
Monocytes Relative: 11 %
Neutro Abs: 2.3 10*3/uL (ref 1.7–7.7)
Neutrophils Relative %: 46 %
Platelets: 288 10*3/uL (ref 150–400)
RBC: 3.7 MIL/uL — ABNORMAL LOW (ref 3.87–5.11)
RDW: 13.9 % (ref 11.5–15.5)
WBC: 5 10*3/uL (ref 4.0–10.5)
nRBC: 0 % (ref 0.0–0.2)

## 2022-03-22 LAB — COMPREHENSIVE METABOLIC PANEL
ALT: 13 U/L (ref 0–44)
AST: 17 U/L (ref 15–41)
Albumin: 3.6 g/dL (ref 3.5–5.0)
Alkaline Phosphatase: 65 U/L (ref 38–126)
Anion gap: 9 (ref 5–15)
BUN: 6 mg/dL (ref 6–20)
CO2: 21 mmol/L — ABNORMAL LOW (ref 22–32)
Calcium: 8.5 mg/dL — ABNORMAL LOW (ref 8.9–10.3)
Chloride: 108 mmol/L (ref 98–111)
Creatinine, Ser: 0.66 mg/dL (ref 0.44–1.00)
GFR, Estimated: >60 mL/min (ref >60)
Glucose, Bld: 103 mg/dL — ABNORMAL HIGH (ref 70–99)
Potassium: 3.4 mmol/L — ABNORMAL LOW (ref 3.5–5.1)
Sodium: 138 mmol/L (ref 135–145)
Total Bilirubin: 0.7 mg/dL (ref 0.3–1.2)
Total Protein: 6.9 g/dL (ref 6.5–8.1)

## 2022-03-22 LAB — MAGNESIUM: Magnesium: 1.9 mg/dL (ref 1.7–2.4)

## 2022-03-22 MED ORDER — HYDROCORTISONE 1 % EX LOTN: 1.0000 | TOPICAL_LOTION | Freq: Two times a day (BID) | CUTANEOUS | 5 refills | Status: DC

## 2022-03-22 MED ORDER — SODIUM CHLORIDE 0.9 % IV SOLN
2400.0000 mg/m2 | INTRAVENOUS | Status: DC
  Administered 2022-03-22: 4450 mg via INTRAVENOUS
  Filled 2022-03-22: qty 89

## 2022-03-22 MED ORDER — HEPARIN SOD (PORK) LOCK FLUSH 100 UNIT/ML IV SOLN: 500.0000 [IU] | Freq: Once | INTRAVENOUS | Status: DC | PRN

## 2022-03-22 MED ORDER — SODIUM CHLORIDE 0.9% FLUSH: 10.0000 mL | INTRAVENOUS | Status: DC | PRN

## 2022-03-22 MED ORDER — SODIUM CHLORIDE 0.9 % IV SOLN
400.0000 mg | Freq: Once | INTRAVENOUS | Status: AC
  Administered 2022-03-22: 400 mg via INTRAVENOUS
  Filled 2022-03-22: qty 20

## 2022-03-22 MED ORDER — FLUOROURACIL CHEMO INJECTION 2.5 GM/50ML
400.0000 mg/m2 | Freq: Once | INTRAVENOUS | Status: AC
  Administered 2022-03-22: 750 mg via INTRAVENOUS
  Filled 2022-03-22: qty 15

## 2022-03-22 MED ORDER — PALONOSETRON HCL INJECTION 0.25 MG/5ML
0.2500 mg | Freq: Once | INTRAVENOUS | Status: AC
  Administered 2022-03-22: 0.25 mg via INTRAVENOUS
  Filled 2022-03-22: qty 5

## 2022-03-22 MED ORDER — SODIUM CHLORIDE 0.9 % IV SOLN
200.0000 mg/m2 | Freq: Once | INTRAVENOUS | Status: AC
  Administered 2022-03-22: 370 mg via INTRAVENOUS
  Filled 2022-03-22: qty 18.5

## 2022-03-22 MED ORDER — SODIUM CHLORIDE 0.9% FLUSH
10.0000 mL | Freq: Once | INTRAVENOUS | Status: AC
  Administered 2022-03-22: 10 mL via INTRAVENOUS

## 2022-03-22 MED ORDER — SODIUM CHLORIDE 0.9 % IV SOLN
10.0000 mg | Freq: Once | INTRAVENOUS | Status: AC
  Administered 2022-03-22: 10 mg via INTRAVENOUS
  Filled 2022-03-22: qty 10

## 2022-03-22 MED ORDER — SODIUM CHLORIDE 0.9 % IV SOLN: Freq: Once | INTRAVENOUS | Status: AC

## 2022-03-22 MED ORDER — SODIUM CHLORIDE 0.9 % IV SOLN
150.0000 mg | Freq: Once | INTRAVENOUS | Status: AC
  Administered 2022-03-22: 150 mg via INTRAVENOUS
  Filled 2022-03-22: qty 150

## 2022-03-22 NOTE — Progress Notes (Signed)
Maintain dose of Vectibix at 400 mg today despite today's current weight change.  Give premedication as per plan, will re-access with next treatment cycle.  T.O. Dr Rhys Martini, PharmD

## 2022-03-22 NOTE — Patient Instructions (Signed)
[place]  Discharge Instructions: Thank you for choosing [facility] to provide your oncology and hematology care.  If you have a lab appointment with the [facility], please come in thru the Main Entrance and check in at the main information desk.  Wear comfortable clothing and clothing appropriate for easy access to any Portacath or PICC line.   We strive to give you quality time with your provider. You may need to reschedule your appointment if you arrive late (15 or more minutes).  Arriving late affects you and other patients whose appointments are after yours.  Also, if you miss three or more appointments without notifying the office, you may be dismissed from the clinic at the provider's discretion.      For prescription refill requests, have your pharmacy contact our office and allow 72 hours for refills to be completed.    Today you received the following chemotherapy and/or immunotherapy agents Adrucil/5FU. Fluorouracil Injection What is this medication? FLUOROURACIL (flure oh YOOR a sil) treats some types of cancer. It works by slowing down the growth of cancer cells. This medicine may be used for other purposes; ask your health care provider or pharmacist if you have questions. COMMON BRAND NAME(S): Adrucil What should I tell my care team before I take this medication? They need to know if you have any of these conditions: Blood disorders Dihydropyrimidine dehydrogenase (DPD) deficiency Infection, such as chickenpox, cold sores, herpes Kidney disease Liver disease Poor nutrition Recent or ongoing radiation therapy An unusual or allergic reaction to fluorouracil, other medications, foods, dyes, or preservatives If you or your partner are pregnant or trying to get pregnant Breast-feeding How should I use this medication? This medication is injected into a vein. It is administered by your care team in a hospital or clinic setting. Talk to  your care team about the use of this medication in children. Special care may be needed. Overdosage: If you think you have taken too much of this medicine contact a poison control center or emergency room at once. NOTE: This medicine is only for you. Do not share this medicine with others. What if I miss a dose? Keep appointments for follow-up doses. It is important not to miss your dose. Call your care team if you are unable to keep an appointment. What may interact with this medication? Do not take this medication with any of the following: Live virus vaccines This medication may also interact with the following: Medications that treat or prevent blood clots, such as warfarin, enoxaparin, dalteparin This list may not describe all possible interactions. Give your health care provider a list of all the medicines, herbs, non-prescription drugs, or dietary supplements you use. Also tell them if you smoke, drink alcohol, or use illegal drugs. Some items may interact with your medicine. What should I watch for while using this medication? Your condition will be monitored carefully while you are receiving this medication. This medication may make you feel generally unwell. This is not uncommon as chemotherapy can affect healthy cells as well as cancer cells. Report any side effects. Continue your course of treatment even though you feel ill unless your care team tells you to stop. In some cases, you may be given additional medications to help with side effects. Follow all directions for their use. This medication may increase your risk of getting an infection. Call your care team for advice if you get a fever, chills, sore throat, or other symptoms of  a cold or flu. Do not treat yourself. Try to avoid being around people who are sick. This medication may increase your risk to bruise or bleed. Call your care team if you notice any unusual bleeding. Be careful brushing or flossing your teeth or using a  toothpick because you may get an infection or bleed more easily. If you have any dental work done, tell your dentist you are receiving this medication. Avoid taking medications that contain aspirin, acetaminophen, ibuprofen, naproxen, or ketoprofen unless instructed by your care team. These medications may hide a fever. Do not treat diarrhea with over the counter products. Contact your care team if you have diarrhea that lasts more than 2 days or if it is severe and watery. This medication can make you more sensitive to the sun. Keep out of the sun. If you cannot avoid being in the sun, wear protective clothing and sunscreen. Do not use sun lamps, tanning beds, or tanning booths. Talk to your care team if you or your partner wish to become pregnant or think you might be pregnant. This medication can cause serious birth defects if taken during pregnancy and for 3 months after the last dose. A reliable form of contraception is recommended while taking this medication and for 3 months after the last dose. Talk to your care team about effective forms of contraception. Do not father a child while taking this medication and for 3 months after the last dose. Use a condom while having sex during this time period. Do not breastfeed while taking this medication. This medication may cause infertility. Talk to your care team if you are concerned about your fertility. What side effects may I notice from receiving this medication? Side effects that you should report to your care team as soon as possible: Allergic reactions--skin rash, itching, hives, swelling of the face, lips, tongue, or throat Heart attack--pain or tightness in the chest, shoulders, arms, or jaw, nausea, shortness of breath, cold or clammy skin, feeling faint or lightheaded Heart failure--shortness of breath, swelling of the ankles, feet, or hands, sudden weight gain, unusual weakness or fatigue Heart rhythm changes--fast or irregular heartbeat,  dizziness, feeling faint or lightheaded, chest pain, trouble breathing High ammonia level--unusual weakness or fatigue, confusion, loss of appetite, nausea, vomiting, seizures Infection--fever, chills, cough, sore throat, wounds that don't heal, pain or trouble when passing urine, general feeling of discomfort or being unwell Low red blood cell level--unusual weakness or fatigue, dizziness, headache, trouble breathing Pain, tingling, or numbness in the hands or feet, muscle weakness, change in vision, confusion or trouble speaking, loss of balance or coordination, trouble walking, seizures Redness, swelling, and blistering of the skin over hands and feet Severe or prolonged diarrhea Unusual bruising or bleeding Side effects that usually do not require medical attention (report to your care team if they continue or are bothersome): Dry skin Headache Increased tears Nausea Pain, redness, or swelling with sores inside the mouth or throat Sensitivity to light Vomiting This list may not describe all possible side effects. Call your doctor for medical advice about side effects. You may report side effects to FDA at 1-800-FDA-1088. Where should I keep my medication? This medication is given in a hospital or clinic. It will not be stored at home. NOTE: This sheet is a summary. It may not cover all possible information. If you have questions about this medicine, talk to your doctor, pharmacist, or health care provider.  2023 Elsevier/Gold Standard (2021-05-25 00:00:00)  To help prevent nausea and vomiting after your treatment, we encourage you to take your nausea medication as directed.  BELOW ARE SYMPTOMS THAT SHOULD BE REPORTED IMMEDIATELY: *FEVER GREATER THAN 100.4 F (38 C) OR HIGHER *CHILLS OR SWEATING *NAUSEA AND VOMITING THAT IS NOT CONTROLLED WITH YOUR NAUSEA MEDICATION *UNUSUAL SHORTNESS OF BREATH *UNUSUAL BRUISING OR BLEEDING *URINARY PROBLEMS (pain or burning when urinating, or  frequent urination) *BOWEL PROBLEMS (unusual diarrhea, constipation, pain near the anus) TENDERNESS IN MOUTH AND THROAT WITH OR WITHOUT PRESENCE OF ULCERS (sore throat, sores in mouth, or a toothache) UNUSUAL RASH, SWELLING OR PAIN  UNUSUAL VAGINAL DISCHARGE OR ITCHING   Items with * indicate a potential emergency and should be followed up as soon as possible or go to the Emergency Department if any problems should occur.  Please show the CHEMOTHERAPY ALERT CARD or IMMUNOTHERAPY ALERT CARD at check-in to the Emergency Department and triage nurse.  Should you have questions after your visit or need to cancel or reschedule your appointment, please contact [place] [phone]  and follow the prompts.  Office hours are 8:00 a.m. to 4:30 p.m. Monday - Friday. Please note that voicemails left after 4:00 p.m. may not be returned until the following business day.  We are closed weekends and major holidays. You have access to a nurse at all times for urgent questions. Please call the main number to the clinic [phone] and follow the prompts.  For any non-urgent questions, you may also contact your provider using MyChart. We now offer e-Visits for anyone 71 and older to request care online for non-urgent symptoms. For details visit [email or web address].   Also download the MyChart app! Go to the app store, search "MyChart", open the app, select [facility], and log in with your MyChart username and password.

## 2022-03-22 NOTE — Progress Notes (Signed)
Patients port flushed without difficulty.  Good blood return noted with no bruising or swelling noted at site.  Stable during access and blood draw.  Patient to remain accessed for treatment. 

## 2022-03-22 NOTE — Progress Notes (Signed)
Patient presents today for treatment . Vectibix, leucovorin, and 5FU pump. Vital signs stable and within parameters for treatment. Labs within parameters for treatment. Patient has no complaints of any side effects related to treatment. Patient states left foot has numbness. Hands and feet numbness has resolved.   Labs within parameters for treatment.   Treatment given today per MD orders. Tolerated infusion without adverse affects. Vital signs stable. No complaints at this time. Discharged from clinic ambulatory in stable condition. Alert and oriented x 3. F/U with [facility] as scheduled.

## 2022-03-24 ENCOUNTER — Inpatient Hospital Stay: Payer: Medicaid Other

## 2022-03-24 ENCOUNTER — Other Ambulatory Visit: Payer: Self-pay | Admitting: *Deleted

## 2022-03-24 VITALS — BP 119/74 | HR 78 | Temp 98.5°F | Resp 18

## 2022-03-24 DIAGNOSIS — Z95828 Presence of other vascular implants and grafts: Secondary | ICD-10-CM

## 2022-03-24 DIAGNOSIS — Z5112 Encounter for antineoplastic immunotherapy: Secondary | ICD-10-CM | POA: Diagnosis not present

## 2022-03-24 DIAGNOSIS — C189 Malignant neoplasm of colon, unspecified: Secondary | ICD-10-CM

## 2022-03-24 MED ORDER — HYDROCORTISONE 1 % EX LOTN: 1.0000 | TOPICAL_LOTION | Freq: Two times a day (BID) | CUTANEOUS | 5 refills | Status: DC

## 2022-03-24 MED ORDER — HEPARIN SOD (PORK) LOCK FLUSH 100 UNIT/ML IV SOLN
500.0000 [IU] | Freq: Once | INTRAVENOUS | Status: AC | PRN
  Administered 2022-03-24: 500 [IU]

## 2022-03-24 MED ORDER — SODIUM CHLORIDE 0.9% FLUSH
10.0000 mL | INTRAVENOUS | Status: DC | PRN
  Administered 2022-03-24: 10 mL

## 2022-03-24 MED ORDER — DOXYCYCLINE HYCLATE 100 MG PO TABS: 100.0000 mg | ORAL_TABLET | Freq: Two times a day (BID) | ORAL | 3 refills | Status: DC

## 2022-03-24 NOTE — Progress Notes (Signed)
Pt presents today for 5FU chemotherapy pump disconnection per provider's order. Vital signs stable, port flushed easily without difficulty with 10 mL of normal saline and 5 mL of heparin. Good blood return noted and needle removed intact. No bruising or swelling noted at the site.  Discharged from clinic ambulatory in stable condition. Alert and oriented x 3. F/U with [facility] as scheduled.

## 2022-03-24 NOTE — Patient Instructions (Signed)
[facility]  Discharge Instructions: Thank you for choosing [facility] to provide your oncology and hematology care.  If you have a lab appointment with the [facility], please come in thru the Main Entrance and check in at the main information desk.  Wear comfortable clothing and clothing appropriate for easy access to any Portacath or PICC line.   We strive to give you quality time with your provider. You may need to reschedule your appointment if you arrive late (15 or more minutes).  Arriving late affects you and other patients whose appointments are after yours.  Also, if you miss three or more appointments without notifying the office, you may be dismissed from the clinic at the provider's discretion.      For prescription refill requests, have your pharmacy contact our office and allow 72 hours for refills to be completed.    Today you received the following chemotherapy and/or immunotherapy agents 5FU chemotherapy pump d/c   BELOW ARE SYMPTOMS THAT SHOULD BE REPORTED IMMEDIATELY: *FEVER GREATER THAN 100.4 F (38 C) OR HIGHER *CHILLS OR SWEATING *NAUSEA AND VOMITING THAT IS NOT CONTROLLED WITH YOUR NAUSEA MEDICATION *UNUSUAL SHORTNESS OF BREATH *UNUSUAL BRUISING OR BLEEDING *URINARY PROBLEMS (pain or burning when urinating, or frequent urination) *BOWEL PROBLEMS (unusual diarrhea, constipation, pain near the anus) TENDERNESS IN MOUTH AND THROAT WITH OR WITHOUT PRESENCE OF ULCERS (sore throat, sores in mouth, or a toothache) UNUSUAL RASH, SWELLING OR PAIN  UNUSUAL VAGINAL DISCHARGE OR ITCHING   Items with * indicate a potential emergency and should be followed up as soon as possible or go to the Emergency Department if any problems should occur.  Please show the CHEMOTHERAPY ALERT CARD or IMMUNOTHERAPY ALERT CARD at check-in to the Emergency Department and triage nurse.  Should you have questions after your visit or need to cancel or  reschedule your appointment, please contact [facility] [phone]  and follow the prompts.  Office hours are 8:00 a.m. to 4:30 p.m. Monday - Friday. Please note that voicemails left after 4:00 p.m. may not be returned until the following business day.  We are closed weekends and major holidays. You have access to a nurse at all times for urgent questions. Please call the main number to the clinic [phone] and follow the prompts.  For any non-urgent questions, you may also contact your provider using MyChart. We now offer e-Visits for anyone 76 and older to request care online for non-urgent symptoms. For details visit [email or web address].   Also download the MyChart app! Go to the app store, search "MyChart", open the app, select [facility], and log in with your MyChart username and password.

## 2022-04-04 ENCOUNTER — Encounter: Payer: Self-pay | Admitting: *Deleted

## 2022-04-05 ENCOUNTER — Inpatient Hospital Stay: Payer: Medicaid Other

## 2022-04-07 ENCOUNTER — Inpatient Hospital Stay: Payer: Medicaid Other

## 2022-04-13 ENCOUNTER — Other Ambulatory Visit: Payer: Self-pay

## 2022-04-14 ENCOUNTER — Other Ambulatory Visit: Payer: Self-pay

## 2022-04-14 DIAGNOSIS — C189 Malignant neoplasm of colon, unspecified: Secondary | ICD-10-CM

## 2022-04-14 DIAGNOSIS — K1379 Other lesions of oral mucosa: Secondary | ICD-10-CM

## 2022-04-14 MED ORDER — LIDOCAINE VISCOUS HCL 2 % MT SOLN: 10.0000 mL | Freq: Four times a day (QID) | OROMUCOSAL | 3 refills | Status: DC | PRN

## 2022-04-14 MED ORDER — SUCRALFATE 1 GM/10ML PO SUSP: 1.0000 g | Freq: Four times a day (QID) | ORAL | 6 refills | Status: DC | PRN

## 2022-04-18 NOTE — Progress Notes (Signed)
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  Biopsy consistent with moderately to poorly differentiated adenocarcinoma. - CTAP with contrast on 01/10/2021: Diffusely thickened, 10 cm segment of distal sigmoid colon with adjacent moderate inflammatory reaction, suspected 2 sites of small bowel fistulization to the diseased segment and a rim-enhancing low-density 2.5 x 1.8 cm stricture medial to the mid segment which could represent a necrotic lymph node or contained perforation/abscess.  There are bilateral mildly enlarged common iliac chain nodes.  Mildly dilated mid to lower abdominal small bowel segments.  Mildly prominent slightly steatotic liver. - Sigmoid colon resection, partial small bowel resection and Hartman's procedure on 03/26/2021: Moderately to poorly differentiated adenocarcinoma involving the subserosal adipose tissue and present at the margin.  9/9 lymph nodes positive for metastatic carcinoma.  Small bowel resection is positive for adenocarcinoma nodules in the serosal fat.  PT4PN2B.  MMR is preserved. - Operative report mentions obvious extension of the tumor outside the colon to the pelvic brim, unable to fully excise all of the tumor in the pelvis. - NGS testing: KRAS/NRAS negative, BRAF negative, HER2 negative, MSI-stable - PET scan on 04/29/2021: Multifocal hypermetabolic nodule, peritoneal and  anterior abdominal wall adenopathy.  Findings worrisome for peritoneal carcinomatosis.  No evidence of hepatic metastatic disease or distant mets. - FOLFOXIRI and Vectibix started on 05/17/2021 - We will see how she responds and will refer to Dr. Crisoforo Oxford for resection/HIPEC therapy.  Colonoscopy through stoma and rectal stump on 10/04/2021 was negative.    Social/family history: - She lives at home with her family.  She works as a Quarry manager at [employer].  Non-smoker. - Paternal cousin had cancer.  Maternal aunt had cancer.  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  Stable small nodule in the root of the sigmoid mesocolon. - Last treatment cycle 16 of FOLFIRI and Vectibix was on 03/22/2022. - Last CEA was 6.0 on 03/08/2022. - Labs today shows normal LFTs.  CBC was grossly normal. - I have reached out to Dr. Crisoforo Oxford at [facility].  She is being scheduled for surgery on 05/19/2022 with ostomy reversal and possible HIPEC therapy. - We will hold her chemotherapy.  She will come back to is 1 month after surgery.  2.  Hypokalemia: - Continue potassium 20 mEq tablet 4 times daily.  3.  Peripheral neuropathy: - Oxaliplatin discontinued on 12/14/2021.  On and off numbness in the feet has been stable.  4.  Microcytic anemia: - Continue iron tablet daily.  Hemoglobin is 11.7.     Orders Placed This Encounter  Procedures   CBC with Differential    Standing Status:   Future    Standing Expiration Date:   04/19/2023   Comprehensive metabolic panel    Standing Status:   Future    Standing Expiration Date:   04/19/2023   Magnesium  Standing Status:   Future    Standing Expiration Date:   04/19/2023   CEA    Standing Status:   Future    Standing Expiration Date:   04/19/2023      I,Alexis Herring,acting as a scribe for Derek Jack, MD.,have documented all relevant  documentation on the behalf of Derek Jack, MD,as directed by  Derek Jack, MD while in the presence of Derek Jack, MD.   I, Derek Jack MD, have reviewed the above documentation for accuracy and completeness, and I agree with the above.   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    Cancer Staging  Colon carcinoma [facility]) Staging form: Colon and Rectum, AJCC 8th Edition - Clinical stage from 04/06/2021: Stage IVC (cT4a, cN2b, pM1c) - Signed by Derek Jack, MD on 05/04/2021    Prior Therapy: Sigmoid colon resection, partial small bowel resection and Hartman's procedure on 03/26/2021   Current Therapy:  FOLFOXIRI + Panitumumab q14d    HISTORY OF PRESENT ILLNESS:   Oncology History  Colon carcinoma ([place])  03/26/2021 Initial Diagnosis   Colon carcinoma ([place])   04/06/2021 Cancer Staging   Staging form: Colon and Rectum, AJCC 8th Edition - Clinical stage from 04/06/2021: Stage IVC (cT4a, cN2b, pM1c) - Signed by Derek Jack, MD on 05/04/2021 Histopathologic type: Adenocarcinoma, NOS Stage prefix: Initial diagnosis Total positive nodes: 9 Total nodes examined: 9 Tumor deposits (TD): Present Carcinoembryonic antigen (CEA) (ng/mL): 61.5 Perineural invasion (PNI): Present Microsatellite instability (MSI): Stable KRAS mutation: Negative NRAS mutation: Negative BRAF mutation: Negative   05/17/2021 - 10/08/2021 Chemotherapy   Patient is on Treatment Plan : COLORECTAL FOLFOXIRI + Panitumumab q14d     05/17/2021 -  Chemotherapy   Patient is on Treatment Plan : COLORECTAL FOLFOXIRI q14d        INTERVAL HISTORY:   Diana Fox is a 52 y.o. 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 She was last seen by me on 03/08/22.  Today, she states that she is doing well overall.  Her appetite level is at 100%. Her energy level is at 85%. She denies any abdominal pains.  Numbness in the feet on and off is stable.   PAST MEDICAL HISTORY:   Past Medical History: Past Medical History:  Diagnosis Date   Anemia    Back pain    Hypertension    Port-A-Cath in place 05/10/2021    Surgical History: Past Surgical History:  Procedure Laterality Date   BIOPSY  03/02/2021   Procedure: BIOPSY;  Surgeon: Eloise Harman, DO;  [facility];  Service: Endoscopy;;   BOWEL RESECTION N/A 03/26/2021   Procedure: SMALL BOWEL RESECTION;  Surgeon: Aviva Signs, MD;  Location: [facility];  Service: General;  Laterality: N/A;   CHOLECYSTECTOMY     COLONOSCOPY WITH PROPOFOL N/A 10/04/2021   Procedure: COLONOSCOPY WITH PROPOFOL;  Surgeon: Eloise Harman, DO;  [facility];  Service: Endoscopy;  Laterality: N/A;  12:15PM, VIA OSTOMY   COLOSTOMY N/A 03/26/2021   Procedure: COLOSTOMY;  Surgeon: Aviva Signs, MD;  Location: [facility];  Service: General;  Laterality: N/A;   ESOPHAGOGASTRODUODENOSCOPY (EGD) WITH PROPOFOL N/A 03/02/2021   Procedure: ESOPHAGOGASTRODUODENOSCOPY (EGD) WITH PROPOFOL;  Surgeon: Eloise Harman, DO;  [facility];  Service: Endoscopy;  Laterality: N/A;   PARTIAL COLECTOMY N/A 03/26/2021   Procedure: PARTIAL COLECTOMY;  Surgeon:  Aviva Signs, MD;  Location: [facility];  Service: General;  Laterality: N/A;   PORTACATH PLACEMENT Left 04/14/2021   Procedure: INSERTION PORT-A-CATH;  Surgeon: Aviva Signs, MD;  Location: [facility];  Service: General;  Laterality: Left;   SCLEROTHERAPY  03/02/2021   Procedure: Clide Deutscher;  Surgeon: Eloise Harman, DO;  [facility];  Service: Endoscopy;;   SIGMOIDOSCOPY  03/02/2021   Procedure: Lonell Face;  Surgeon: Eloise Harman, DO;  [facility];  Service: Endoscopy;;    Social History: Social History   Socioeconomic History   Marital status: Married    Spouse name: Not on  file   Number of children: Not on file   Years of education: Not on file   Highest education level: Not on file  Occupational History   Not on file  Tobacco Use   Smoking status: Never   Smokeless tobacco: Never  Vaping Use   Vaping Use: Never used  Substance and Sexual Activity   Alcohol use: No   Drug use: No   Sexual activity: Not on file  Other Topics Concern   Not on file  Social History Narrative   Not on file   Social Determinants of Health   Financial Resource Strain: High Risk (05/19/2021)   Overall Financial Resource Strain (CARDIA)    Difficulty of Paying Living Expenses: Hard  Food Insecurity: Not on file  Transportation Needs: Not on file  Physical Activity: Not on file  Stress: Not on file  Social Connections: Not on file  Intimate Partner Violence: Not on file    Family History: Family History  Problem Relation Age of Onset   Hypertension Mother    Hypertension Father    Colon cancer Neg Hx    Colon polyps Neg Hx    Inflammatory bowel disease Neg Hx     Current Medications:  Current Outpatient Medications:    amoxicillin-clavulanate (AUGMENTIN) 875-125 MG tablet, Take 1 tablet by mouth 2 (two) times daily., Disp: , Rfl:    doxycycline (VIBRA-TABS) 100 MG tablet, Take 1 tablet (100 mg total) by mouth 2 (two) times daily., Disp: 90 tablet, Rfl: 3   ferrous sulfate 325 (65 FE) MG tablet, TAKE 325 MG BY MOUTH DAILY. (Patient taking differently: 325 mg daily with breakfast.), Disp: 90 tablet, Rfl: 1   fexofenadine (ALLEGRA) 180 MG tablet, Take 1 tablet (180 mg total) by mouth daily., Disp: 30 tablet, Rfl: 0   fluorouracil CALGB 83151 2,400 mg/m2 in sodium chloride 0.9 % 150 mL, Inject 2,400 mg/m2 into the vein over 48 hr. Every 14 days, Disp: , Rfl:    FLUOROURACIL IV, Inject into the vein every 14 (fourteen) days., Disp: , Rfl:    fluticasone (FLONASE) 50 MCG/ACT nasal spray, Place 2 sprays into both nostrils daily., Disp: 16 g, Rfl: 12   hydrocortisone  1 % lotion, Apply 1 Application topically 2 (two) times daily., Disp: 118 mL, Rfl: 5   lidocaine (XYLOCAINE) 2 % solution, Use as directed 10 mLs in the mouth or throat 4 (four) times daily as needed for mouth pain (Mix 1:1 with carafate and swish and spit)., Disp: 100 mL, Rfl: 3   magnesium oxide (MAG-OX) 400 (240 Mg) MG tablet, Take 1 tablet (400 mg total) by mouth 2 (two) times daily., Disp: 60 tablet, Rfl: 6   medroxyPROGESTERone (DEPO-PROVERA) 150 MG/ML injection, Inject 150 mg into the muscle every 3 (three) months., Disp: , Rfl:    metroNIDAZOLE (METROGEL) 0.75 % vaginal gel, Place  vaginally at bedtime., Disp: , Rfl:    Multiple Vitamin (MULTIVITAMIN) tablet, Take 1 tablet by mouth daily., Disp: , Rfl:    nebivolol (BYSTOLIC) 10 MG tablet, Take 1 tablet (10 mg total) by mouth daily., Disp: 30 tablet, Rfl: 1   OXALIPLATIN IV, Inject into the vein every 14 (fourteen) days., Disp: , Rfl:    Panitumumab (VECTIBIX IV), Inject into the vein every 14 (fourteen) days., Disp: , Rfl:    pantoprazole (PROTONIX) 40 MG tablet, Take 40 mg by mouth daily as needed (acid reflux)., Disp: , Rfl:    potassium chloride SA (KLOR-CON M20) 20 MEQ tablet, Take 1 tablet (20 mEq total) by mouth in the morning, at noon, in the evening, and at bedtime., Disp: 120 tablet, Rfl: 3   sucralfate (CARAFATE) 1 GM/10ML suspension, Take 10 mLs (1 g total) by mouth 4 (four) times daily as needed. Mix 1:1 with 2% lidocaine viscous solution and swish and spit, Disp: 420 mL, Rfl: 6   Allergies: Allergies  Allergen Reactions   Zithromax [Azithromycin] Hives    infusing arm began to swell , her face got red, and she broke out, there are bumps around where the IV site was infusing   Motrin [Ibuprofen] Itching   Sudafed [Pseudoephedrine Hcl] Other (See Comments)    Hypertension   Zestril [Lisinopril] Cough    REVIEW OF SYSTEMS:   Review of Systems  Neurological:  Positive for numbness.  All other systems reviewed and are  negative.    VITALS:   Blood pressure (!) 155/93.  Wt Readings from Last 3 Encounters:  04/19/22 174 lb 9.6 oz (79.2 kg)  03/22/22 166 lb 6.4 oz (75.5 kg)  03/08/22 165 lb 3.2 oz (74.9 kg)    There is no height or weight on file to calculate BMI.  Performance status (ECOG): 1 - Symptomatic but completely ambulatory  PHYSICAL EXAM:   Physical Exam Vitals reviewed.  Constitutional:      Appearance: Normal appearance.  Cardiovascular:     Rate and Rhythm: Normal rate and regular rhythm.     Heart sounds: Normal heart sounds.  Pulmonary:     Effort: Pulmonary effort is normal.     Breath sounds: Normal breath sounds.  Neurological:     Mental Status: She is alert.  Psychiatric:        Mood and Affect: Mood normal.        Behavior: Behavior normal.     LABS:      Latest Ref Rng & Units 04/19/2022    9:07 AM 03/22/2022    7:57 AM 03/08/2022    8:17 AM  CBC  WBC 4.0 - 10.5 K/uL 6.6  5.0  6.2   Hemoglobin 12.0 - 15.0 g/dL 11.7  11.4  11.3   Hematocrit 36.0 - 46.0 % 34.4  34.2  33.7   Platelets 150 - 400 K/uL 253  288  205       Latest Ref Rng & Units 04/19/2022    9:07 AM 03/22/2022    7:57 AM 03/08/2022    8:17 AM  CMP  Glucose 70 - 99 mg/dL 98  103  102  C  BUN 6 - 20 mg/dL '7  6  7   '$ Creatinine 0.44 - 1.00 mg/dL 0.79  0.66  0.69  C  Sodium 135 - 145 mmol/L 137  138  137   Potassium 3.5 - 5.1 mmol/L 3.3  3.4  3.4   Chloride 98 - 111 mmol/L 105  108  107   CO2 22 - 32 mmol/L 23  21  >45   Calcium 8.9 - 10.3 mg/dL 8.5  8.5  8.7   Total Protein 6.5 - 8.1 g/dL 6.9  6.9  6.8   Total Bilirubin 0.3 - 1.2 mg/dL 0.7  0.7  0.4   Alkaline Phos 38 - 126 U/L 58  65  83   AST 15 - 41 U/L '17  17  19   '$ ALT 0 - 44 U/L '15  13  22     '$ C Corrected result     Lab Results  Component Value Date   CEA1 6.0 (H) 03/08/2022   /  CEA  Date Value Ref Range Status  03/08/2022 6.0 (H) 0.0 - 4.7 ng/mL Final    Comment:    (NOTE)                             Nonsmokers           <3.9                             Smokers             <5.6 [employer] Electrochemiluminescence Immunoassay (ECLIA) Values obtained with different assay methods or kits cannot be used interchangeably.  Results cannot be interpreted as absolute evidence of the presence or absence of malignant disease. Performed At: [facility] [place], [place] [id number] Rush Farmer MD [id number]    No results found for: "PSA1" No results found for: "CAN199" No results found for: "CAN125"  No results found for: "TOTALPROTELP", "ALBUMINELP", "A1GS", "A2GS", "BETS", "BETA2SER", "GAMS", "MSPIKE", "SPEI" Lab Results  Component Value Date   TIBC 256 01/11/2021   FERRITIN 108 01/11/2021   IRONPCTSAT 4 (L) 01/11/2021   No results found for: "LDH"   STUDIES:   No results found.

## 2022-04-19 ENCOUNTER — Inpatient Hospital Stay: Payer: Medicaid Other | Attending: Hematology | Admitting: Hematology

## 2022-04-19 ENCOUNTER — Inpatient Hospital Stay: Payer: Medicaid Other

## 2022-04-19 DIAGNOSIS — C189 Malignant neoplasm of colon, unspecified: Secondary | ICD-10-CM

## 2022-04-19 DIAGNOSIS — Z95828 Presence of other vascular implants and grafts: Secondary | ICD-10-CM

## 2022-04-19 DIAGNOSIS — D509 Iron deficiency anemia, unspecified: Secondary | ICD-10-CM | POA: Insufficient documentation

## 2022-04-19 DIAGNOSIS — I10 Essential (primary) hypertension: Secondary | ICD-10-CM | POA: Diagnosis not present

## 2022-04-19 DIAGNOSIS — G629 Polyneuropathy, unspecified: Secondary | ICD-10-CM | POA: Insufficient documentation

## 2022-04-19 DIAGNOSIS — E876 Hypokalemia: Secondary | ICD-10-CM | POA: Insufficient documentation

## 2022-04-19 DIAGNOSIS — C187 Malignant neoplasm of sigmoid colon: Secondary | ICD-10-CM | POA: Diagnosis present

## 2022-04-19 LAB — CBC WITH DIFFERENTIAL/PLATELET
Abs Immature Granulocytes: 0.03 10*3/uL (ref 0.00–0.07)
Basophils Absolute: 0 10*3/uL (ref 0.0–0.1)
Basophils Relative: 1 %
Eosinophils Absolute: 0.1 10*3/uL (ref 0.0–0.5)
Eosinophils Relative: 1 %
HCT: 34.4 % — ABNORMAL LOW (ref 36.0–46.0)
Hemoglobin: 11.7 g/dL — ABNORMAL LOW (ref 12.0–15.0)
Immature Granulocytes: 1 %
Lymphocytes Relative: 34 %
Lymphs Abs: 2.2 10*3/uL (ref 0.7–4.0)
MCH: 30.7 pg (ref 26.0–34.0)
MCHC: 34 g/dL (ref 30.0–36.0)
MCV: 90.3 fL (ref 80.0–100.0)
Monocytes Absolute: 0.7 10*3/uL (ref 0.1–1.0)
Monocytes Relative: 10 %
Neutro Abs: 3.5 10*3/uL (ref 1.7–7.7)
Neutrophils Relative %: 53 %
Platelets: 253 10*3/uL (ref 150–400)
RBC: 3.81 MIL/uL — ABNORMAL LOW (ref 3.87–5.11)
RDW: 14.6 % (ref 11.5–15.5)
WBC: 6.6 10*3/uL (ref 4.0–10.5)
nRBC: 0 % (ref 0.0–0.2)

## 2022-04-19 LAB — COMPREHENSIVE METABOLIC PANEL
ALT: 15 U/L (ref 0–44)
AST: 17 U/L (ref 15–41)
Albumin: 3.6 g/dL (ref 3.5–5.0)
Alkaline Phosphatase: 58 U/L (ref 38–126)
Anion gap: 9 (ref 5–15)
BUN: 7 mg/dL (ref 6–20)
CO2: 23 mmol/L (ref 22–32)
Calcium: 8.5 mg/dL — ABNORMAL LOW (ref 8.9–10.3)
Chloride: 105 mmol/L (ref 98–111)
Creatinine, Ser: 0.79 mg/dL (ref 0.44–1.00)
GFR, Estimated: >60 mL/min (ref >60)
Glucose, Bld: 98 mg/dL (ref 70–99)
Potassium: 3.3 mmol/L — ABNORMAL LOW (ref 3.5–5.1)
Sodium: 137 mmol/L (ref 135–145)
Total Bilirubin: 0.7 mg/dL (ref 0.3–1.2)
Total Protein: 6.9 g/dL (ref 6.5–8.1)

## 2022-04-19 LAB — MAGNESIUM: Magnesium: 1.7 mg/dL (ref 1.7–2.4)

## 2022-04-19 MED ORDER — SODIUM CHLORIDE 0.9% FLUSH
10.0000 mL | Freq: Once | INTRAVENOUS | Status: AC
  Administered 2022-04-19: 10 mL

## 2022-04-19 MED ORDER — SODIUM CHLORIDE 0.9% FLUSH
10.0000 mL | INTRAVENOUS | Status: DC | PRN
  Administered 2022-04-19: 10 mL via INTRAVENOUS

## 2022-04-19 MED ORDER — HEPARIN SOD (PORK) LOCK FLUSH 100 UNIT/ML IV SOLN
500.0000 [IU] | Freq: Once | INTRAVENOUS | Status: AC
  Administered 2022-04-19: 500 [IU] via INTRAVENOUS

## 2022-04-19 NOTE — Patient Instructions (Addendum)
[facility] Discharge Instructions   You were seen and examined today by Dr. Delton Coombes.  He reviewed the results of your lab work which are normal/stable.   We will hold your treatment today. We will see you back one month after your surgery scheduled on 05/19/22.   Return as scheduled.    Thank you for choosing [facility] at [facility] to provide your oncology and hematology care.  To afford each patient quality time with our provider, please arrive at least 15 minutes before your scheduled appointment time.   If you have a lab appointment with the [facility] please come in thru the Main Entrance and check in at the main information desk.  You need to re-schedule your appointment should you arrive 10 or more minutes late.  We strive to give you quality time with our providers, and arriving late affects you and other patients whose appointments are after yours.  Also, if you no show three or more times for appointments you may be dismissed from the clinic at the providers discretion.     Again, thank you for choosing [facility].  Our hope is that these requests will decrease the amount of time that you wait before being seen by our physicians.       _____________________________________________________________  Should you have questions after your visit to [facility], please contact our office at [phone] and follow the prompts.  Our office hours are 8:00 a.m. and 4:30 p.m. Monday - Friday.  Please note that voicemails left after 4:00 p.m. may not be returned until the following business day.  We are closed weekends and major holidays.  You do have access to a nurse 24-7, just call the main number to the clinic [phone] and do not press any options, hold on the line and a nurse will answer the phone.    For prescription refill requests, have your pharmacy contact our office and allow 72 hours.     Due to Covid, you will need to wear a mask upon entering the hospital. If you do not have a mask, a mask will be given to you at the Main Entrance upon arrival. For doctor visits, patients may have 1 support person age 48 or older with them. For treatment visits, patients can not have anyone with them due to social distancing guidelines and our immunocompromised population.

## 2022-04-19 NOTE — Progress Notes (Signed)
Per Dr. Delton Coombes, Holding treatment today due to upcoming surgery on 05/19/22.  Will be scheduled one month after surgery.

## 2022-04-21 ENCOUNTER — Inpatient Hospital Stay: Payer: Medicaid Other

## 2022-04-21 LAB — CEA: CEA: 7 ng/mL — ABNORMAL HIGH (ref 0.0–4.7)

## 2022-05-03 ENCOUNTER — Other Ambulatory Visit: Payer: Medicaid Other

## 2022-05-03 ENCOUNTER — Ambulatory Visit: Payer: Medicaid Other

## 2022-05-10 ENCOUNTER — Other Ambulatory Visit: Payer: Self-pay

## 2022-05-12 ENCOUNTER — Other Ambulatory Visit: Payer: Self-pay

## 2022-05-16 ENCOUNTER — Inpatient Hospital Stay: Payer: Medicaid Other | Attending: Hematology

## 2022-05-16 ENCOUNTER — Inpatient Hospital Stay: Payer: Medicaid Other | Admitting: Hematology

## 2022-05-16 DIAGNOSIS — Z95828 Presence of other vascular implants and grafts: Secondary | ICD-10-CM

## 2022-05-16 DIAGNOSIS — D509 Iron deficiency anemia, unspecified: Secondary | ICD-10-CM | POA: Diagnosis not present

## 2022-05-16 DIAGNOSIS — E876 Hypokalemia: Secondary | ICD-10-CM | POA: Diagnosis not present

## 2022-05-16 DIAGNOSIS — G629 Polyneuropathy, unspecified: Secondary | ICD-10-CM | POA: Diagnosis not present

## 2022-05-16 DIAGNOSIS — Z79899 Other long term (current) drug therapy: Secondary | ICD-10-CM | POA: Insufficient documentation

## 2022-05-16 DIAGNOSIS — C187 Malignant neoplasm of sigmoid colon: Secondary | ICD-10-CM | POA: Insufficient documentation

## 2022-05-16 DIAGNOSIS — Z793 Long term (current) use of hormonal contraceptives: Secondary | ICD-10-CM | POA: Insufficient documentation

## 2022-05-16 DIAGNOSIS — C189 Malignant neoplasm of colon, unspecified: Secondary | ICD-10-CM

## 2022-05-16 LAB — CBC WITH DIFFERENTIAL/PLATELET
Abs Immature Granulocytes: 0.02 10*3/uL (ref 0.00–0.07)
Basophils Absolute: 0 10*3/uL (ref 0.0–0.1)
Basophils Relative: 1 %
Eosinophils Absolute: 0.1 10*3/uL (ref 0.0–0.5)
Eosinophils Relative: 1 %
HCT: 33.6 % — ABNORMAL LOW (ref 36.0–46.0)
Hemoglobin: 11.3 g/dL — ABNORMAL LOW (ref 12.0–15.0)
Immature Granulocytes: 0 %
Lymphocytes Relative: 34 %
Lymphs Abs: 2.2 10*3/uL (ref 0.7–4.0)
MCH: 30.1 pg (ref 26.0–34.0)
MCHC: 33.6 g/dL (ref 30.0–36.0)
MCV: 89.6 fL (ref 80.0–100.0)
Monocytes Absolute: 0.6 10*3/uL (ref 0.1–1.0)
Monocytes Relative: 10 %
Neutro Abs: 3.5 10*3/uL (ref 1.7–7.7)
Neutrophils Relative %: 54 %
Platelets: 287 10*3/uL (ref 150–400)
RBC: 3.75 MIL/uL — ABNORMAL LOW (ref 3.87–5.11)
RDW: 14.4 % (ref 11.5–15.5)
WBC: 6.5 10*3/uL (ref 4.0–10.5)
nRBC: 0 % (ref 0.0–0.2)

## 2022-05-16 LAB — COMPREHENSIVE METABOLIC PANEL
ALT: 14 U/L (ref 0–44)
AST: 17 U/L (ref 15–41)
Albumin: 3.5 g/dL (ref 3.5–5.0)
Alkaline Phosphatase: 55 U/L (ref 38–126)
Anion gap: 5 (ref 5–15)
BUN: 9 mg/dL (ref 6–20)
CO2: 22 mmol/L (ref 22–32)
Calcium: 8.7 mg/dL — ABNORMAL LOW (ref 8.9–10.3)
Chloride: 109 mmol/L (ref 98–111)
Creatinine, Ser: 0.67 mg/dL (ref 0.44–1.00)
GFR, Estimated: >60 mL/min (ref >60)
Glucose, Bld: 93 mg/dL (ref 70–99)
Potassium: 3.5 mmol/L (ref 3.5–5.1)
Sodium: 136 mmol/L (ref 135–145)
Total Bilirubin: 0.4 mg/dL (ref 0.3–1.2)
Total Protein: 7 g/dL (ref 6.5–8.1)

## 2022-05-16 LAB — MAGNESIUM: Magnesium: 1.8 mg/dL (ref 1.7–2.4)

## 2022-05-16 MED ORDER — HEPARIN SOD (PORK) LOCK FLUSH 100 UNIT/ML IV SOLN
500.0000 [IU] | Freq: Once | INTRAVENOUS | Status: AC
  Administered 2022-05-16: 500 [IU] via INTRAVENOUS

## 2022-05-16 MED ORDER — SODIUM CHLORIDE 0.9% FLUSH
10.0000 mL | Freq: Once | INTRAVENOUS | Status: AC
  Administered 2022-05-16: 10 mL via INTRAVENOUS

## 2022-05-16 NOTE — Progress Notes (Signed)
Patients port flushed without difficulty.  Good blood return noted with no bruising or swelling noted at site.  Band aid applied.  VSS with discharge and left in satisfactory condition with no s/s of distress noted.   

## 2022-05-17 ENCOUNTER — Other Ambulatory Visit: Payer: Medicaid Other

## 2022-05-17 ENCOUNTER — Ambulatory Visit: Payer: Medicaid Other

## 2022-05-17 ENCOUNTER — Ambulatory Visit: Payer: Medicaid Other | Admitting: Hematology

## 2022-05-18 ENCOUNTER — Inpatient Hospital Stay: Payer: Medicaid Other | Admitting: Hematology

## 2022-05-31 ENCOUNTER — Other Ambulatory Visit: Payer: Self-pay | Admitting: Hematology

## 2022-05-31 ENCOUNTER — Other Ambulatory Visit: Payer: Medicaid Other

## 2022-05-31 ENCOUNTER — Ambulatory Visit: Payer: Medicaid Other

## 2022-05-31 ENCOUNTER — Ambulatory Visit: Payer: Medicaid Other | Admitting: Hematology

## 2022-05-31 DIAGNOSIS — E876 Hypokalemia: Secondary | ICD-10-CM

## 2022-06-06 NOTE — Progress Notes (Signed)
[facility] [address] [place] [address]    Clinic Day:  06/07/2022  Referring physician: [name], MD  Patient Care Team: [name], MD as PCP - General (Family Medicine) [name], MD as Medical Oncologist (Medical Oncology) [name], RN as Oncology Nurse Navigator (Medical Oncology)   ASSESSMENT & PLAN:   Assessment: 1. 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  Biopsy consistent with moderately to poorly differentiated adenocarcinoma. - CTAP with contrast on 01/10/2021: Diffusely thickened, 10 cm segment of distal sigmoid colon with adjacent moderate inflammatory reaction, suspected 2 sites of small bowel fistulization to the diseased segment and a rim-enhancing low-density 2.5 x 1.8 cm stricture medial to the mid segment which could represent a necrotic lymph node or contained perforation/abscess.  There are bilateral mildly enlarged common iliac chain nodes.  Mildly dilated mid to lower abdominal small bowel segments.  Mildly prominent slightly steatotic liver. - Sigmoid colon resection, partial small bowel resection and Hartman's procedure on 03/26/2021: Moderately to poorly differentiated adenocarcinoma involving the subserosal adipose tissue and present at the margin.  9/9 lymph nodes positive for metastatic carcinoma.  Small bowel resection is positive for adenocarcinoma nodules in the serosal fat.  PT4PN2B.  MMR is preserved. - Operative report mentions obvious extension of the tumor outside the colon to the pelvic brim, unable to fully excise all of the tumor in the pelvis. - NGS testing: KRAS/NRAS negative, BRAF negative, HER2 negative, MSI-stable - PET scan on 04/29/2021: Multifocal hypermetabolic nodule, peritoneal and  anterior abdominal wall adenopathy.  Findings worrisome for peritoneal carcinomatosis.  No evidence of hepatic metastatic disease or distant mets. - FOLFOXIRI and Vectibix started on 05/17/2021 - We will see how she responds and will refer to Dr. Flonnie Hailstone for resection/HIPEC therapy.  Colonoscopy through stoma and rectal stump on 10/04/2021 was negative.   2. Social/family history: - She lives at home with her family.  She works as a Lawyer at [employer].  Non-smoker. - Paternal cousin had cancer.  Maternal aunt had cancer.    Plan: 1. 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 Flonnie Hailstone and Dr. Kaylyn Lim. - She decided not to proceed with surgery. - We reviewed MRI of the pelvis which showed metastatic disease on the ovaries.  CT scan also showed worsening lymphadenopathy from 05/10/2022. - CEA on 04/19/2022 increased to 7 from 6. - We I have discussed restarting chemotherapy.  We will start her on FOLFIRI with panitumumab. - I will see her prior to next cycle.  2.  Hypokalemia: - Continue potassium 20 mEq twice daily.  3.  Peripheral neuropathy: - She has numbness in the feet which is stable.  We will avoid oxaliplatin.  4.  Microcytic anemia: - She stopped taking iron tablet.  We will recheck her ferritin and iron panel to see if she needs to go back on it.   No orders of the defined types were placed in this encounter.     I,Katie Daubenspeck,acting as a Neurosurgeon for [name], MD.,have documented all relevant documentation on the behalf of [name], MD,as directed by  [name], MD while in the presence of [name], MD.   I, [name] MD, have reviewed the  above documentation for accuracy and completeness, and I agree with the above.   [name], MD   4/30/20246:03 PM  CHIEF COMPLAINT:   Diagnosis: stage IV sigmoid colon  adenocarcinoma, MSI-stable, RAS/BRAF negative    Cancer Staging  Colon carcinoma [facility]) Staging form: Colon and Rectum, AJCC 8th Edition - Clinical stage from 04/06/2021: Stage IVC (cT4a, cN2b, pM1c) - Signed by [name], MD on 05/04/2021    Prior Therapy: Sigmoid colon resection, partial small bowel resection and Hartman's procedure on 03/26/2021   Current Therapy:  FOLFOXIRI + Panitumumab q14d    HISTORY OF PRESENT ILLNESS:   Oncology History  Colon carcinoma (HCC)  03/26/2021 Initial Diagnosis   Colon carcinoma (HCC)   04/06/2021 Cancer Staging   Staging form: Colon and Rectum, AJCC 8th Edition - Clinical stage from 04/06/2021: Stage IVC (cT4a, cN2b, pM1c) - Signed by [name], MD on 05/04/2021 Histopathologic type: Adenocarcinoma, NOS Stage prefix: Initial diagnosis Total positive nodes: 9 Total nodes examined: 9 Tumor deposits (TD): Present Carcinoembryonic antigen (CEA) (ng/mL): 61.5 Perineural invasion (PNI): Present Microsatellite instability (MSI): Stable KRAS mutation: Negative NRAS mutation: Negative BRAF mutation: Negative   05/17/2021 - 10/08/2021 Chemotherapy   Patient is on Treatment Plan : COLORECTAL FOLFOXIRI + Panitumumab q14d     05/17/2021 -  Chemotherapy   Patient is on Treatment Plan : COLORECTAL FOLFOXIRI q14d        INTERVAL HISTORY:   Latronda is a 52 y.o. female presenting to clinic today for follow up of stage IV sigmoid colon adenocarcinoma. She was last seen by me on 04/19/22.  Since her last visit, she underwent restaging CT C/A/P on 05/10/22 at [facility] showing: enlargement of bilateral ovaries with lobulated appearance; increase in size of a nodal mass adjacent to inferior mesenteric artery.  She also underwent pelvis MRI the same day showing: heterogeneously enhancing bilateral adnexal lesions; redemonstration of nonspecific enhancing nodal deposit adjacent to IMA; enlarged fibroid uterus.  Today, she states that she is  doing well overall. Her appetite level is at 100%. Her energy level is at 100%.  PAST MEDICAL HISTORY:   Past Medical History: Past Medical History:  Diagnosis Date   Anemia    Back pain    Hypertension    Port-A-Cath in place 05/10/2021    Surgical History: Past Surgical History:  Procedure Laterality Date   BIOPSY  03/02/2021   Procedure: BIOPSY;  Surgeon: Lanelle Bal, DO;  [facility]: [facility];  Service: Endoscopy;;   BOWEL RESECTION N/A 03/26/2021   Procedure: SMALL BOWEL RESECTION;  Surgeon: Franky Macho, MD;  Location: [facility];  Service: General;  Laterality: N/A;   CHOLECYSTECTOMY     COLONOSCOPY WITH PROPOFOL N/A 10/04/2021   Procedure: COLONOSCOPY WITH PROPOFOL;  Surgeon: Lanelle Bal, DO;  Location: [facility];  Service: Endoscopy;  Laterality: N/A;  12:15PM, VIA OSTOMY   COLOSTOMY N/A 03/26/2021   Procedure: COLOSTOMY;  Surgeon: Franky Macho, MD;  Location: [facility];  Service: General;  Laterality: N/A;   ESOPHAGOGASTRODUODENOSCOPY (EGD) WITH PROPOFOL N/A 03/02/2021   Procedure: ESOPHAGOGASTRODUODENOSCOPY (EGD) WITH PROPOFOL;  Surgeon: Lanelle Bal, DO;  [facility];  Service: Endoscopy;  Laterality: N/A;   PARTIAL COLECTOMY N/A 03/26/2021   Procedure: PARTIAL COLECTOMY;  Surgeon: Franky Macho, MD;  Location: [facility];  Service: General;  Laterality: N/A;   PORTACATH PLACEMENT Left 04/14/2021   Procedure: INSERTION PORT-A-CATH;  Surgeon: Franky Macho, MD;  Location: [facility];  Service: General;  Laterality: Left;  SCLEROTHERAPY  03/02/2021   Procedure: SCLEROTHERAPY;  Surgeon: Lanelle Bal, DO;  [facility]: [facility];  Service: Endoscopy;;   SIGMOIDOSCOPY  03/02/2021   Procedure: SIGMOIDOSCOPY;  Surgeon: Lanelle Bal, DO;  [facility]: [facility];  Service: Endoscopy;;    Social History: Social History   Socioeconomic History   Marital status: Married    Spouse name: Not on file   Number of children: Not on file   Years of  education: Not on file   Highest education level: Not on file  Occupational History   Not on file  Tobacco Use   Smoking status: Never   Smokeless tobacco: Never  Vaping Use   Vaping Use: Never used  Substance and Sexual Activity   Alcohol use: No   Drug use: No   Sexual activity: Not on file  Other Topics Concern   Not on file  Social History Narrative   Not on file   Social Determinants of Health   Financial Resource Strain: High Risk (05/19/2021)   Overall Financial Resource Strain (CARDIA)    Difficulty of Paying Living Expenses: Hard  Food Insecurity: Not on file  Transportation Needs: Not on file  Physical Activity: Not on file  Stress: Not on file  Social Connections: Not on file  Intimate Partner Violence: Not on file    Family History: Family History  Problem Relation Age of Onset   Hypertension Mother    Hypertension Father    Colon cancer Neg Hx    Colon polyps Neg Hx    Inflammatory bowel disease Neg Hx     Current Medications:  Current Outpatient Medications:    amoxicillin-clavulanate (AUGMENTIN) 875-125 MG tablet, Take 1 tablet by mouth 2 (two) times daily., Disp: , Rfl:    doxycycline (VIBRA-TABS) 100 MG tablet, Take 1 tablet (100 mg total) by mouth 2 (two) times daily., Disp: 90 tablet, Rfl: 3   fexofenadine (ALLEGRA) 180 MG tablet, Take 1 tablet (180 mg total) by mouth daily., Disp: 30 tablet, Rfl: 0   fluorouracil CALGB 16109 2,400 mg/m2 in sodium chloride 0.9 % 150 mL, Inject 2,400 mg/m2 into the vein over 48 hr. Every 14 days, Disp: , Rfl:    FLUOROURACIL IV, Inject into the vein every 14 (fourteen) days., Disp: , Rfl:    fluticasone (FLONASE) 50 MCG/ACT nasal spray, Place 2 sprays into both nostrils daily., Disp: 16 g, Rfl: 12   hydrocortisone 1 % lotion, Apply 1 Application topically 2 (two) times daily., Disp: 118 mL, Rfl: 5   KLOR-CON M20 20 MEQ tablet, TAKE 1 TABLET (20 MEQ TOTAL) BY MOUTH IN THE MORNING, AT NOON, IN THE EVENING, AND AT  BEDTIME., Disp: 360 tablet, Rfl: 1   lidocaine (XYLOCAINE) 2 % solution, Use as directed 10 mLs in the mouth or throat 4 (four) times daily as needed for mouth pain (Mix 1:1 with carafate and swish and spit)., Disp: 100 mL, Rfl: 3   magnesium oxide (MAG-OX) 400 (240 Mg) MG tablet, Take 1 tablet (400 mg total) by mouth 2 (two) times daily., Disp: 60 tablet, Rfl: 6   medroxyPROGESTERone (DEPO-PROVERA) 150 MG/ML injection, Inject 150 mg into the muscle every 3 (three) months., Disp: , Rfl:    metroNIDAZOLE (METROGEL) 0.75 % vaginal gel, Place vaginally at bedtime., Disp: , Rfl:    Multiple Vitamin (MULTIVITAMIN) tablet, Take 1 tablet by mouth daily., Disp: , Rfl:    nebivolol (BYSTOLIC) 10 MG tablet, Take 1 tablet (10 mg total) by mouth  daily., Disp: 30 tablet, Rfl: 1   OXALIPLATIN IV, Inject into the vein every 14 (fourteen) days., Disp: , Rfl:    oxyCODONE (OXY IR/ROXICODONE) 5 MG immediate release tablet, Take by mouth., Disp: , Rfl:    Panitumumab (VECTIBIX IV), Inject into the vein every 14 (fourteen) days., Disp: , Rfl:    pantoprazole (PROTONIX) 40 MG tablet, Take 40 mg by mouth daily as needed (acid reflux)., Disp: , Rfl:    sucralfate (CARAFATE) 1 GM/10ML suspension, Take 10 mLs (1 g total) by mouth 4 (four) times daily as needed. Mix 1:1 with 2% lidocaine viscous solution and swish and spit, Disp: 420 mL, Rfl: 6   ferrous sulfate 325 (65 FE) MG tablet, Take 1 tablet (325 mg total) by mouth daily with breakfast. TAKE 1 tablet BY MOUTH DAILY., Disp: 90 tablet, Rfl: 1   Allergies: Allergies  Allergen Reactions   Zithromax [Azithromycin] Hives    infusing arm began to swell , her face got red, and she broke out, there are bumps around where the IV site was infusing   Motrin [Ibuprofen] Itching   Sudafed [Pseudoephedrine Hcl] Other (See Comments)    Hypertension   Zestril [Lisinopril] Cough    REVIEW OF SYSTEMS:   Review of Systems  Constitutional:  Negative for chills, fatigue and  fever.  HENT:   Negative for lump/mass, mouth sores, nosebleeds, sore throat and trouble swallowing.   Eyes:  Negative for eye problems.  Respiratory:  Negative for cough and shortness of breath.   Cardiovascular:  Negative for chest pain, leg swelling and palpitations.  Gastrointestinal:  Negative for abdominal pain, constipation, diarrhea, nausea and vomiting.  Genitourinary:  Negative for bladder incontinence, difficulty urinating, dysuria, frequency, hematuria and nocturia.   Musculoskeletal:  Negative for arthralgias, back pain, flank pain, myalgias and neck pain.  Skin:  Negative for itching and rash.  Neurological:  Positive for numbness. Negative for dizziness and headaches.  Hematological:  Does not bruise/bleed easily.  Psychiatric/Behavioral:  Negative for depression, sleep disturbance and suicidal ideas. The patient is not nervous/anxious.   All other systems reviewed and are negative.    VITALS:   Blood pressure (!) 160/92, pulse 94, temperature 98.4 F (36.9 C), temperature source Tympanic, resp. rate 18, height 5\' 5"  (1.651 m), weight 183 lb (83 kg), SpO2 100 %.  Wt Readings from Last 3 Encounters:  06/07/22 183 lb (83 kg)  04/19/22 174 lb 9.6 oz (79.2 kg)  03/22/22 166 lb 6.4 oz (75.5 kg)    Body mass index is 30.45 kg/m.  Performance status (ECOG): 1 - Symptomatic but completely ambulatory  PHYSICAL EXAM:   Physical Exam Vitals and nursing note reviewed. Exam conducted with a chaperone present.  Constitutional:      Appearance: Normal appearance.  Cardiovascular:     Rate and Rhythm: Normal rate and regular rhythm.     Pulses: Normal pulses.     Heart sounds: Normal heart sounds.  Pulmonary:     Effort: Pulmonary effort is normal.     Breath sounds: Normal breath sounds.  Abdominal:     Palpations: Abdomen is soft. There is no hepatomegaly, splenomegaly or mass.     Tenderness: There is no abdominal tenderness.  Musculoskeletal:     Right lower leg: No  edema.     Left lower leg: No edema.  Lymphadenopathy:     Cervical: No cervical adenopathy.     Right cervical: No superficial, deep or posterior cervical adenopathy.  Left cervical: No superficial, deep or posterior cervical adenopathy.     Upper Body:     Right upper body: No supraclavicular or axillary adenopathy.     Left upper body: No supraclavicular or axillary adenopathy.  Neurological:     General: No focal deficit present.     Mental Status: She is alert and oriented to person, place, and time.  Psychiatric:        Mood and Affect: Mood normal.        Behavior: Behavior normal.     LABS:      Latest Ref Rng & Units 05/16/2022    1:57 PM 04/19/2022    9:07 AM 03/22/2022    7:57 AM  CBC  WBC 4.0 - 10.5 K/uL 6.5  6.6  5.0   Hemoglobin 12.0 - 15.0 g/dL 16.1  09.6  04.5   Hematocrit 36.0 - 46.0 % 33.6  34.4  34.2   Platelets 150 - 400 K/uL 287  253  288       Latest Ref Rng & Units 05/16/2022    1:57 PM 04/19/2022    9:07 AM 03/22/2022    7:57 AM  CMP  Glucose 70 - 99 mg/dL 93  98  409   BUN 6 - 20 mg/dL 9  7  6    Creatinine 0.44 - 1.00 mg/dL 8.11  9.14  7.82   Sodium 135 - 145 mmol/L 136  137  138   Potassium 3.5 - 5.1 mmol/L 3.5  3.3  3.4   Chloride 98 - 111 mmol/L 109  105  108   CO2 22 - 32 mmol/L 22  23  21    Calcium 8.9 - 10.3 mg/dL 8.7  8.5  8.5   Total Protein 6.5 - 8.1 g/dL 7.0  6.9  6.9   Total Bilirubin 0.3 - 1.2 mg/dL 0.4  0.7  0.7   Alkaline Phos 38 - 126 U/L 55  58  65   AST 15 - 41 U/L 17  17  17    ALT 0 - 44 U/L 14  15  13       Lab Results  Component Value Date   CEA1 7.0 (H) 04/19/2022   /  CEA  Date Value Ref Range Status  04/19/2022 7.0 (H) 0.0 - 4.7 ng/mL Final    Comment:    (NOTE)                             Nonsmokers          <3.9                             Smokers             <5.6 [employer] Electrochemiluminescence Immunoassay (ECLIA) Values obtained with different assay methods or kits cannot be used  interchangeably.  Results cannot be interpreted as absolute evidence of the presence or absence of malignant disease. Performed At: [facility] [address] [place], [place] [id number] Jolene Schimke MD VH:[id number]    No results found for: "PSA1" No results found for: "CAN199" No results found for: "CAN125"  No results found for: "TOTALPROTELP", "ALBUMINELP", "A1GS", "A2GS", "BETS", "BETA2SER", "GAMS", "MSPIKE", "SPEI" Lab Results  Component Value Date   TIBC 256 01/11/2021   FERRITIN 108 01/11/2021   IRONPCTSAT 4 (L) 01/11/2021   No results found for: "LDH"   STUDIES:  No results found.

## 2022-06-07 ENCOUNTER — Inpatient Hospital Stay (HOSPITAL_BASED_OUTPATIENT_CLINIC_OR_DEPARTMENT_OTHER): Payer: Medicaid Other | Admitting: Hematology

## 2022-06-07 DIAGNOSIS — D509 Iron deficiency anemia, unspecified: Secondary | ICD-10-CM | POA: Diagnosis not present

## 2022-06-07 DIAGNOSIS — C187 Malignant neoplasm of sigmoid colon: Secondary | ICD-10-CM | POA: Diagnosis not present

## 2022-06-07 MED ORDER — FERROUS SULFATE 325 (65 FE) MG PO TABS
325.0000 mg | ORAL_TABLET | Freq: Every day | ORAL | 1 refills | Status: DC
Start: 2022-06-07 — End: 2023-02-16

## 2022-06-07 NOTE — Patient Instructions (Addendum)
[facility] at [facility] Discharge Instructions   You were seen and examined today by Dr. Ellin Saba.  He reviewed the results of your lab work which are normal/stable.   You have decided to forego surgery and resume treatment with chemotherapy. We will schedule you for this soon.   Return as scheduled.    Thank you for choosing [facility] at [facility] to provide your oncology and hematology care.  To afford each patient quality time with our provider, please arrive at least 15 minutes before your scheduled appointment time.   If you have a lab appointment with the Cancer Center please come in thru the Main Entrance and check in at the main information desk.  You need to re-schedule your appointment should you arrive 10 or more minutes late.  We strive to give you quality time with our providers, and arriving late affects you and other patients whose appointments are after yours.  Also, if you no show three or more times for appointments you may be dismissed from the clinic at the providers discretion.     Again, thank you for choosing [facility].  Our hope is that these requests will decrease the amount of time that you wait before being seen by our physicians.       _____________________________________________________________  Should you have questions after your visit to [facility], please contact our office at [phone] and follow the prompts.  Our office hours are 8:00 a.m. and 4:30 p.m. Monday - Friday.  Please note that voicemails left after 4:00 p.m. may not be returned until the following business day.  We are closed weekends and major holidays.  You do have access to a nurse 24-7, just call the main number to the clinic [phone] and do not press any options, hold on the line and a nurse will answer the phone.    For prescription refill requests, have your pharmacy contact our office and allow 72  hours.    Due to Covid, you will need to wear a mask upon entering the hospital. If you do not have a mask, a mask will be given to you at the Main Entrance upon arrival. For doctor visits, patients may have 1 support person age 3 or older with them. For treatment visits, patients can not have anyone with them due to social distancing guidelines and our immunocompromised population.

## 2022-06-07 NOTE — Progress Notes (Signed)
[employer] Disability claim papers competed and faxed with confirmation received.

## 2022-06-08 ENCOUNTER — Other Ambulatory Visit: Payer: Self-pay

## 2022-06-13 ENCOUNTER — Inpatient Hospital Stay: Payer: Medicaid Other | Attending: Hematology

## 2022-06-13 ENCOUNTER — Inpatient Hospital Stay: Payer: Medicaid Other

## 2022-06-13 VITALS — BP 139/87 | HR 91 | Temp 98.6°F | Resp 18 | Wt 184.6 lb

## 2022-06-13 VITALS — BP 137/79 | HR 80 | Temp 98.1°F | Resp 18

## 2022-06-13 DIAGNOSIS — C189 Malignant neoplasm of colon, unspecified: Secondary | ICD-10-CM

## 2022-06-13 DIAGNOSIS — E876 Hypokalemia: Secondary | ICD-10-CM

## 2022-06-13 DIAGNOSIS — Z5111 Encounter for antineoplastic chemotherapy: Secondary | ICD-10-CM | POA: Insufficient documentation

## 2022-06-13 DIAGNOSIS — C187 Malignant neoplasm of sigmoid colon: Secondary | ICD-10-CM | POA: Diagnosis not present

## 2022-06-13 DIAGNOSIS — Z95828 Presence of other vascular implants and grafts: Secondary | ICD-10-CM

## 2022-06-13 DIAGNOSIS — R109 Unspecified abdominal pain: Secondary | ICD-10-CM

## 2022-06-13 LAB — CBC WITH DIFFERENTIAL/PLATELET
Abs Immature Granulocytes: 0.02 10*3/uL (ref 0.00–0.07)
Basophils Absolute: 0.1 10*3/uL (ref 0.0–0.1)
Basophils Relative: 1 %
Eosinophils Absolute: 0.1 10*3/uL (ref 0.0–0.5)
Eosinophils Relative: 2 %
HCT: 35.3 % — ABNORMAL LOW (ref 36.0–46.0)
Hemoglobin: 11.7 g/dL — ABNORMAL LOW (ref 12.0–15.0)
Immature Granulocytes: 0 %
Lymphocytes Relative: 31 %
Lymphs Abs: 2.3 10*3/uL (ref 0.7–4.0)
MCH: 29.3 pg (ref 26.0–34.0)
MCHC: 33.1 g/dL (ref 30.0–36.0)
MCV: 88.5 fL (ref 80.0–100.0)
Monocytes Absolute: 0.6 10*3/uL (ref 0.1–1.0)
Monocytes Relative: 7 %
Neutro Abs: 4.4 10*3/uL (ref 1.7–7.7)
Neutrophils Relative %: 59 %
Platelets: 292 10*3/uL (ref 150–400)
RBC: 3.99 MIL/uL (ref 3.87–5.11)
RDW: 13.8 % (ref 11.5–15.5)
WBC: 7.4 10*3/uL (ref 4.0–10.5)
nRBC: 0 % (ref 0.0–0.2)

## 2022-06-13 LAB — COMPREHENSIVE METABOLIC PANEL
ALT: 12 U/L (ref 0–44)
AST: 23 U/L (ref 15–41)
Albumin: 3.5 g/dL (ref 3.5–5.0)
Alkaline Phosphatase: 50 U/L (ref 38–126)
Anion gap: 7 (ref 5–15)
BUN: 7 mg/dL (ref 6–20)
CO2: 21 mmol/L — ABNORMAL LOW (ref 22–32)
Calcium: 8.5 mg/dL — ABNORMAL LOW (ref 8.9–10.3)
Chloride: 106 mmol/L (ref 98–111)
Creatinine, Ser: 0.78 mg/dL (ref 0.44–1.00)
GFR, Estimated: >60 mL/min (ref >60)
Glucose, Bld: 123 mg/dL — ABNORMAL HIGH (ref 70–99)
Potassium: 3.4 mmol/L — ABNORMAL LOW (ref 3.5–5.1)
Sodium: 134 mmol/L — ABNORMAL LOW (ref 135–145)
Total Bilirubin: 0.5 mg/dL (ref 0.3–1.2)
Total Protein: 7 g/dL (ref 6.5–8.1)

## 2022-06-13 LAB — MAGNESIUM: Magnesium: 1.8 mg/dL (ref 1.7–2.4)

## 2022-06-13 MED ORDER — SODIUM CHLORIDE 0.9% FLUSH
10.0000 mL | Freq: Once | INTRAVENOUS | Status: AC
  Administered 2022-06-13: 10 mL via INTRAVENOUS

## 2022-06-13 MED ORDER — SODIUM CHLORIDE 0.9 % IV SOLN
200.0000 mg/m2 | Freq: Once | INTRAVENOUS | Status: AC
  Administered 2022-06-13: 370 mg via INTRAVENOUS
  Filled 2022-06-13: qty 18.5

## 2022-06-13 MED ORDER — SODIUM CHLORIDE 0.9 % IV SOLN
150.0000 mg | Freq: Once | INTRAVENOUS | Status: AC
  Administered 2022-06-13: 150 mg via INTRAVENOUS
  Filled 2022-06-13: qty 150

## 2022-06-13 MED ORDER — SODIUM CHLORIDE 0.9 % IV SOLN
10.0000 mg | Freq: Once | INTRAVENOUS | Status: AC
  Administered 2022-06-13: 10 mg via INTRAVENOUS
  Filled 2022-06-13: qty 10

## 2022-06-13 MED ORDER — SODIUM CHLORIDE 0.9 % IV SOLN: Freq: Once | INTRAVENOUS | Status: AC

## 2022-06-13 MED ORDER — SODIUM CHLORIDE 0.9 % IV SOLN
2400.0000 mg/m2 | INTRAVENOUS | Status: DC
  Administered 2022-06-13: 4450 mg via INTRAVENOUS
  Filled 2022-06-13: qty 89

## 2022-06-13 MED ORDER — PALONOSETRON HCL INJECTION 0.25 MG/5ML
0.2500 mg | Freq: Once | INTRAVENOUS | Status: AC
  Administered 2022-06-13: 0.25 mg via INTRAVENOUS
  Filled 2022-06-13: qty 5

## 2022-06-13 MED ORDER — POTASSIUM CHLORIDE CRYS ER 20 MEQ PO TBCR
40.0000 meq | EXTENDED_RELEASE_TABLET | Freq: Once | ORAL | Status: AC
  Administered 2022-06-13: 40 meq via ORAL
  Filled 2022-06-13: qty 2

## 2022-06-13 MED ORDER — SODIUM CHLORIDE 0.9 % IV SOLN
165.0000 mg/m2 | Freq: Once | INTRAVENOUS | Status: AC
  Administered 2022-06-13: 300 mg via INTRAVENOUS
  Filled 2022-06-13: qty 15

## 2022-06-13 MED ORDER — ATROPINE SULFATE 1 MG/ML IV SOLN
0.5000 mg | Freq: Once | INTRAVENOUS | Status: AC
  Administered 2022-06-13: 0.5 mg via INTRAVENOUS
  Filled 2022-06-13: qty 1

## 2022-06-13 MED ORDER — FLUOROURACIL CHEMO INJECTION 2.5 GM/50ML
400.0000 mg/m2 | Freq: Once | INTRAVENOUS | Status: AC
  Administered 2022-06-13: 750 mg via INTRAVENOUS
  Filled 2022-06-13: qty 15

## 2022-06-13 MED ORDER — SODIUM CHLORIDE 0.9 % IV SOLN
500.0000 mg | Freq: Once | INTRAVENOUS | Status: AC
  Administered 2022-06-13: 500 mg via INTRAVENOUS
  Filled 2022-06-13: qty 20

## 2022-06-13 NOTE — Progress Notes (Signed)
Patient new weight 83.7 kg - OK to increase Panitumumab dose to 500 mg (6 mg/kg).  T.O. Dr Carilyn Goodpasture, PharmD

## 2022-06-13 NOTE — Progress Notes (Signed)
Patient presents today for chemotherapy infusion.  Patient is in satisfactory condition with no new complaints voiced.  Vital signs are stable.  Labs reviewed and all labs are within treatment parameters. Potassium today is 3.4.  We will give Klor Con 40 mEq PO x one dose today per standing orders by Dr. Ellin Saba.   We will proceed with treatment per MD orders.    Patient c/o abdominal cramping. Atropine 0.5 mg IV x one dose was given per Dr. Ellin Saba.    Patient tolerated treatment well with no complaints voiced.  Home infusion 5FU pump connected with no issues.  Patient left ambulatory in stable condition.  Vital signs stable at discharge.  Follow up as scheduled.

## 2022-06-13 NOTE — Patient Instructions (Signed)
[facility]  Discharge Instructions: Thank you for choosing [facility] to provide your oncology and hematology care.  If you have a lab appointment with the Cancer Center - please note that after April 8th, 2024, all labs will be drawn in the cancer center.  You do not have to check in or register with the main entrance as you have in the past but will complete your check-in in the cancer center.  Wear comfortable clothing and clothing appropriate for easy access to any Portacath or PICC line.   We strive to give you quality time with your provider. You may need to reschedule your appointment if you arrive late (15 or more minutes).  Arriving late affects you and other patients whose appointments are after yours.  Also, if you miss three or more appointments without notifying the office, you may be dismissed from the clinic at the provider's discretion.      For prescription refill requests, have your pharmacy contact our office and allow 72 hours for refills to be completed.    Today you received the following chemotherapy and/or immunotherapy agents Vectibix/Leucovorin/Irinotecan/5FU.  Panitumumab Injection What is this medication? PANITUMUMAB (pan i TOOM ue mab) treats colorectal cancer. It works by blocking a protein that causes cancer cells to grow and multiply. This helps to slow or stop the spread of cancer cells. It is a monoclonal antibody. This medicine may be used for other purposes; ask your health care provider or pharmacist if you have questions. COMMON BRAND NAME(S): Vectibix What should I tell my care team before I take this medication? They need to know if you have any of these conditions: Eye disease Low levels of magnesium in the blood Lung disease An unusual or allergic reaction to panitumumab, other medications, foods, dyes, or preservatives Pregnant or trying to get pregnant Breast-feeding How should I use this medication? This  medication is injected into a vein. It is given by your care team in a hospital or clinic setting. Talk to your care team about the use of this medication in children. Special care may be needed. Overdosage: If you think you have taken too much of this medicine contact a poison control center or emergency room at once. NOTE: This medicine is only for you. Do not share this medicine with others. What if I miss a dose? Keep appointments for follow-up doses. It is important not to miss your dose. Call your care team if you are unable to keep an appointment. What may interact with this medication? Bevacizumab This list may not describe all possible interactions. Give your health care provider a list of all the medicines, herbs, non-prescription drugs, or dietary supplements you use. Also tell them if you smoke, drink alcohol, or use illegal drugs. Some items may interact with your medicine. What should I watch for while using this medication? Your condition will be monitored carefully while you are receiving this medication. This medication may make you feel generally unwell. This is not uncommon as chemotherapy can affect healthy cells as well as cancer cells. Report any side effects. Continue your course of treatment even though you feel ill unless your care team tells you to stop. This medication can make you more sensitive to the sun. Keep out of the sun while receiving this medication and for 2 months after stopping therapy. If you cannot avoid being in the sun, wear protective clothing and sunscreen. Do not use sun lamps, tanning beds, or tanning booths.  Check with your care team if you have severe diarrhea, nausea, and vomiting or if you sweat a lot. The loss of too much body fluid may make it dangerous for you to take this medication. This medication may cause serious skin reactions. They can happen weeks to months after starting the medication. Contact your care team right away if you notice  fevers or flu-like symptoms with a rash. The rash may be red or purple and then turn into blisters or peeling of the skin. You may also notice a red rash with swelling of the face, lips, or lymph nodes in your neck or under your arms. Talk to your care team if you may be pregnant. Serious birth defects can occur if you take this medication during pregnancy and for 2 months after the last dose. Contraception is recommended while taking this medication and for 2 months after the last dose. Your care team can help you find the option that works for you. Do not breastfeed while taking this medication and for 2 months after the last dose. This medication may cause infertility. Talk to your care team if you are concerned about your fertility. What side effects may I notice from receiving this medication? Side effects that you should report to your care team as soon as possible: Allergic reactions--skin rash, itching, hives, swelling of the face, lips, tongue, or throat Dry cough, shortness of breath or trouble breathing Eye pain, redness, irritation, or discharge with blurry or decreased vision Infusion reactions--chest pain, shortness of breath or trouble breathing, feeling faint or lightheaded Low magnesium level--muscle pain or cramps, unusual weakness or fatigue, fast or irregular heartbeat, tremors Low potassium level--muscle pain or cramps, unusual weakness or fatigue, fast or irregular heartbeat, constipation Redness, blistering, peeling, or loosening of the skin, including inside the mouth Skin reactions on sun-exposed areas Side effects that usually do not require medical attention (report to your care team if they continue or are bothersome): Change in nail shape, thickness, or color Diarrhea Dry skin Fatigue Nausea Vomiting This list may not describe all possible side effects. Call your doctor for medical advice about side effects. You may report side effects to FDA at  1-800-FDA-1088. Where should I keep my medication? This medication is given in a hospital or clinic. It will not be stored at home. NOTE: This sheet is a summary. It may not cover all possible information. If you have questions about this medicine, talk to your doctor, pharmacist, or health care provider.  2023 Elsevier/Gold Standard (2021-06-07 00:00:00)    Leucovorin Injection What is this medication? LEUCOVORIN (loo koe VOR in) prevents side effects from certain medications, such as methotrexate. It works by increasing folate levels. This helps protect healthy cells in your body. It may also be used to treat anemia caused by low levels of folate. It can also be used with fluorouracil, a type of chemotherapy, to treat colorectal cancer. It works by increasing the effects of fluorouracil in the body. This medicine may be used for other purposes; ask your health care provider or pharmacist if you have questions. What should I tell my care team before I take this medication? They need to know if you have any of these conditions: Anemia from low levels of vitamin B12 in the blood An unusual or allergic reaction to leucovorin, folic acid, other medications, foods, dyes, or preservatives Pregnant or trying to get pregnant Breastfeeding How should I use this medication? This medication is injected into a vein or a  muscle. It is given by your care team in a hospital or clinic setting. Talk to your care team about the use of this medication in children. Special care may be needed. Overdosage: If you think you have taken too much of this medicine contact a poison control center or emergency room at once. NOTE: This medicine is only for you. Do not share this medicine with others. What if I miss a dose? Keep appointments for follow-up doses. It is important not to miss your dose. Call your care team if you are unable to keep an appointment. What may interact with this  medication? Capecitabine Fluorouracil Phenobarbital Phenytoin Primidone Trimethoprim;sulfamethoxazole This list may not describe all possible interactions. Give your health care provider a list of all the medicines, herbs, non-prescription drugs, or dietary supplements you use. Also tell them if you smoke, drink alcohol, or use illegal drugs. Some items may interact with your medicine. What should I watch for while using this medication? Your condition will be monitored carefully while you are receiving this medication. This medication may increase the side effects of 5-fluorouracil. Tell your care team if you have diarrhea or mouth sores that do not get better or that get worse. What side effects may I notice from receiving this medication? Side effects that you should report to your care team as soon as possible: Allergic reactions--skin rash, itching, hives, swelling of the face, lips, tongue, or throat This list may not describe all possible side effects. Call your doctor for medical advice about side effects. You may report side effects to FDA at 1-800-FDA-1088. Where should I keep my medication? This medication is given in a hospital or clinic. It will not be stored at home. NOTE: This sheet is a summary. It may not cover all possible information. If you have questions about this medicine, talk to your doctor, pharmacist, or health care provider.  2023 Elsevier/Gold Standard (2021-06-04 00:00:00)    Irinotecan Injection What is this medication? IRINOTECAN (ir in oh TEE kan) treats some types of cancer. It works by slowing down the growth of cancer cells. This medicine may be used for other purposes; ask your health care provider or pharmacist if you have questions. COMMON BRAND NAME(S): Camptosar What should I tell my care team before I take this medication? They need to know if you have any of these conditions: Dehydration Diarrhea Infection, especially a viral infection, such  as chickenpox, cold sores, herpes Liver disease Low blood cell levels (white cells, red cells, and platelets) Low levels of electrolytes, such as calcium, magnesium, or potassium in your blood Recent or ongoing radiation An unusual or allergic reaction to irinotecan, other medications, foods, dyes, or preservatives If you or your partner are pregnant or trying to get pregnant Breast-feeding How should I use this medication? This medication is injected into a vein. It is given by your care team in a hospital or clinic setting. Talk to your care team about the use of this medication in children. Special care may be needed. Overdosage: If you think you have taken too much of this medicine contact a poison control center or emergency room at once. NOTE: This medicine is only for you. Do not share this medicine with others. What if I miss a dose? Keep appointments for follow-up doses. It is important not to miss your dose. Call your care team if you are unable to keep an appointment. What may interact with this medication? Do not take this medication with any of the  following: Cobicistat Itraconazole This medication may also interact with the following: Certain antibiotics, such as clarithromycin, rifampin, rifabutin Certain antivirals for HIV or AIDS Certain medications for fungal infections, such as ketoconazole, posaconazole, voriconazole Certain medications for seizures, such as carbamazepine, phenobarbital, phenytoin Gemfibrozil Nefazodone St. John's wort This list may not describe all possible interactions. Give your health care provider a list of all the medicines, herbs, non-prescription drugs, or dietary supplements you use. Also tell them if you smoke, drink alcohol, or use illegal drugs. Some items may interact with your medicine. What should I watch for while using this medication? Your condition will be monitored carefully while you are receiving this medication. You may need  blood work while taking this medication. This medication may make you feel generally unwell. This is not uncommon as chemotherapy can affect healthy cells as well as cancer cells. Report any side effects. Continue your course of treatment even though you feel ill unless your care team tells you to stop. This medication can cause serious side effects. To reduce the risk, your care team may give you other medications to take before receiving this one. Be sure to follow the directions from your care team. This medication may affect your coordination, reaction time, or judgement. Do not drive or operate machinery until you know how this medication affects you. Sit up or stand slowly to reduce the risk of dizzy or fainting spells. Drinking alcohol with this medication can increase the risk of these side effects. This medication may increase your risk of getting an infection. Call your care team for advice if you get a fever, chills, sore throat, or other symptoms of a cold or flu. Do not treat yourself. Try to avoid being around people who are sick. Avoid taking medications that contain aspirin, acetaminophen, ibuprofen, naproxen, or ketoprofen unless instructed by your care team. These medications may hide a fever. This medication may increase your risk to bruise or bleed. Call your care team if you notice any unusual bleeding. Be careful brushing or flossing your teeth or using a toothpick because you may get an infection or bleed more easily. If you have any dental work done, tell your dentist you are receiving this medication. Talk to your care team if you or your partner are pregnant or think either of you might be pregnant. This medication can cause serious birth defects if taken during pregnancy and for 6 months after the last dose. You will need a negative pregnancy test before starting this medication. Contraception is recommended while taking this medication and for 6 months after the last dose. Your  care team can help you find the option that works for you. Do not father a child while taking this medication and for 3 months after the last dose. Use a condom for contraception during this time period. Do not breastfeed while taking this medication and for 7 days after the last dose. This medication may cause infertility. Talk to your care team if you are concerned about your fertility. What side effects may I notice from receiving this medication? Side effects that you should report to your care team as soon as possible: Allergic reactions--skin rash, itching, hives, swelling of the face, lips, tongue, or throat Dry cough, shortness of breath or trouble breathing Increased saliva or tears, increased sweating, stomach cramping, diarrhea, small pupils, unusual weakness or fatigue, slow heartbeat Infection--fever, chills, cough, sore throat, wounds that don't heal, pain or trouble when passing urine, general feeling of discomfort or being  unwell Kidney injury--decrease in the amount of urine, swelling of the ankles, hands, or feet Low red blood cell level--unusual weakness or fatigue, dizziness, headache, trouble breathing Severe or prolonged diarrhea Unusual bruising or bleeding Side effects that usually do not require medical attention (report to your care team if they continue or are bothersome): Constipation Diarrhea Hair loss Loss of appetite Nausea Stomach pain This list may not describe all possible side effects. Call your doctor for medical advice about side effects. You may report side effects to FDA at 1-800-FDA-1088. Where should I keep my medication? This medication is given in a hospital or clinic. It will not be stored at home. NOTE: This sheet is a summary. It may not cover all possible information. If you have questions about this medicine, talk to your doctor, pharmacist, or health care provider.  2023 Elsevier/Gold Standard (2021-06-03 00:00:00)    Fluorouracil  Injection What is this medication? FLUOROURACIL (flure oh YOOR a sil) treats some types of cancer. It works by slowing down the growth of cancer cells. This medicine may be used for other purposes; ask your health care provider or pharmacist if you have questions. COMMON BRAND NAME(S): Adrucil What should I tell my care team before I take this medication? They need to know if you have any of these conditions: Blood disorders Dihydropyrimidine dehydrogenase (DPD) deficiency Infection, such as chickenpox, cold sores, herpes Kidney disease Liver disease Poor nutrition Recent or ongoing radiation therapy An unusual or allergic reaction to fluorouracil, other medications, foods, dyes, or preservatives If you or your partner are pregnant or trying to get pregnant Breast-feeding How should I use this medication? This medication is injected into a vein. It is administered by your care team in a hospital or clinic setting. Talk to your care team about the use of this medication in children. Special care may be needed. Overdosage: If you think you have taken too much of this medicine contact a poison control center or emergency room at once. NOTE: This medicine is only for you. Do not share this medicine with others. What if I miss a dose? Keep appointments for follow-up doses. It is important not to miss your dose. Call your care team if you are unable to keep an appointment. What may interact with this medication? Do not take this medication with any of the following: Live virus vaccines This medication may also interact with the following: Medications that treat or prevent blood clots, such as warfarin, enoxaparin, dalteparin This list may not describe all possible interactions. Give your health care provider a list of all the medicines, herbs, non-prescription drugs, or dietary supplements you use. Also tell them if you smoke, drink alcohol, or use illegal drugs. Some items may interact with  your medicine. What should I watch for while using this medication? Your condition will be monitored carefully while you are receiving this medication. This medication may make you feel generally unwell. This is not uncommon as chemotherapy can affect healthy cells as well as cancer cells. Report any side effects. Continue your course of treatment even though you feel ill unless your care team tells you to stop. In some cases, you may be given additional medications to help with side effects. Follow all directions for their use. This medication may increase your risk of getting an infection. Call your care team for advice if you get a fever, chills, sore throat, or other symptoms of a cold or flu. Do not treat yourself. Try to avoid being around  people who are sick. This medication may increase your risk to bruise or bleed. Call your care team if you notice any unusual bleeding. Be careful brushing or flossing your teeth or using a toothpick because you may get an infection or bleed more easily. If you have any dental work done, tell your dentist you are receiving this medication. Avoid taking medications that contain aspirin, acetaminophen, ibuprofen, naproxen, or ketoprofen unless instructed by your care team. These medications may hide a fever. Do not treat diarrhea with over the counter products. Contact your care team if you have diarrhea that lasts more than 2 days or if it is severe and watery. This medication can make you more sensitive to the sun. Keep out of the sun. If you cannot avoid being in the sun, wear protective clothing and sunscreen. Do not use sun lamps, tanning beds, or tanning booths. Talk to your care team if you or your partner wish to become pregnant or think you might be pregnant. This medication can cause serious birth defects if taken during pregnancy and for 3 months after the last dose. A reliable form of contraception is recommended while taking this medication and for 3  months after the last dose. Talk to your care team about effective forms of contraception. Do not father a child while taking this medication and for 3 months after the last dose. Use a condom while having sex during this time period. Do not breastfeed while taking this medication. This medication may cause infertility. Talk to your care team if you are concerned about your fertility. What side effects may I notice from receiving this medication? Side effects that you should report to your care team as soon as possible: Allergic reactions--skin rash, itching, hives, swelling of the face, lips, tongue, or throat Heart attack--pain or tightness in the chest, shoulders, arms, or jaw, nausea, shortness of breath, cold or clammy skin, feeling faint or lightheaded Heart failure--shortness of breath, swelling of the ankles, feet, or hands, sudden weight gain, unusual weakness or fatigue Heart rhythm changes--fast or irregular heartbeat, dizziness, feeling faint or lightheaded, chest pain, trouble breathing High ammonia level--unusual weakness or fatigue, confusion, loss of appetite, nausea, vomiting, seizures Infection--fever, chills, cough, sore throat, wounds that don't heal, pain or trouble when passing urine, general feeling of discomfort or being unwell Low red blood cell level--unusual weakness or fatigue, dizziness, headache, trouble breathing Pain, tingling, or numbness in the hands or feet, muscle weakness, change in vision, confusion or trouble speaking, loss of balance or coordination, trouble walking, seizures Redness, swelling, and blistering of the skin over hands and feet Severe or prolonged diarrhea Unusual bruising or bleeding Side effects that usually do not require medical attention (report to your care team if they continue or are bothersome): Dry skin Headache Increased tears Nausea Pain, redness, or swelling with sores inside the mouth or throat Sensitivity to  light Vomiting This list may not describe all possible side effects. Call your doctor for medical advice about side effects. You may report side effects to FDA at 1-800-FDA-1088. Where should I keep my medication? This medication is given in a hospital or clinic. It will not be stored at home. NOTE: This sheet is a summary. It may not cover all possible information. If you have questions about this medicine, talk to your doctor, pharmacist, or health care provider.  2023 Elsevier/Gold Standard (2021-05-25 00:00:00)        To help prevent nausea and vomiting after your treatment, we encourage  you to take your nausea medication as directed.  BELOW ARE SYMPTOMS THAT SHOULD BE REPORTED IMMEDIATELY: *FEVER GREATER THAN 100.4 F (38 C) OR HIGHER *CHILLS OR SWEATING *NAUSEA AND VOMITING THAT IS NOT CONTROLLED WITH YOUR NAUSEA MEDICATION *UNUSUAL SHORTNESS OF BREATH *UNUSUAL BRUISING OR BLEEDING *URINARY PROBLEMS (pain or burning when urinating, or frequent urination) *BOWEL PROBLEMS (unusual diarrhea, constipation, pain near the anus) TENDERNESS IN MOUTH AND THROAT WITH OR WITHOUT PRESENCE OF ULCERS (sore throat, sores in mouth, or a toothache) UNUSUAL RASH, SWELLING OR PAIN  UNUSUAL VAGINAL DISCHARGE OR ITCHING   Items with * indicate a potential emergency and should be followed up as soon as possible or go to the Emergency Department if any problems should occur.  Please show the CHEMOTHERAPY ALERT CARD or IMMUNOTHERAPY ALERT CARD at check-in to the Emergency Department and triage nurse.  Should you have questions after your visit or need to cancel or reschedule your appointment, please contact [facility] [phone]  and follow the prompts.  Office hours are 8:00 a.m. to 4:30 p.m. Monday - Friday. Please note that voicemails left after 4:00 p.m. may not be returned until the following business day.  We are closed weekends and major holidays. You have access to a nurse  at all times for urgent questions. Please call the main number to the clinic [phone] and follow the prompts.  For any non-urgent questions, you may also contact your provider using MyChart. We now offer e-Visits for anyone 23 and older to request care online for non-urgent symptoms. For details visit [email or web address].   Also download the MyChart app! Go to the app store, search "MyChart", open the app, select , and log in with your MyChart username and password.

## 2022-06-15 ENCOUNTER — Inpatient Hospital Stay: Payer: Medicaid Other

## 2022-06-15 VITALS — BP 140/87 | HR 88 | Temp 98.5°F | Resp 18

## 2022-06-15 DIAGNOSIS — C189 Malignant neoplasm of colon, unspecified: Secondary | ICD-10-CM

## 2022-06-15 DIAGNOSIS — Z95828 Presence of other vascular implants and grafts: Secondary | ICD-10-CM

## 2022-06-15 DIAGNOSIS — Z5111 Encounter for antineoplastic chemotherapy: Secondary | ICD-10-CM | POA: Diagnosis not present

## 2022-06-15 LAB — CEA: CEA: 68.9 ng/mL — ABNORMAL HIGH (ref 0.0–4.7)

## 2022-06-15 MED ORDER — SODIUM CHLORIDE 0.9% FLUSH
10.0000 mL | INTRAVENOUS | Status: DC | PRN
  Administered 2022-06-15: 10 mL

## 2022-06-15 MED ORDER — HEPARIN SOD (PORK) LOCK FLUSH 100 UNIT/ML IV SOLN
500.0000 [IU] | Freq: Once | INTRAVENOUS | Status: AC | PRN
  Administered 2022-06-15: 500 [IU]

## 2022-06-15 NOTE — Progress Notes (Signed)
Patients port flushed without difficulty.  Good blood return noted with no bruising or swelling noted at site.  Home infusion 5FU pump disconnected with no issues.  Band aid applied.  VSS with discharge and left in satisfactory condition with no s/s of distress noted.   

## 2022-06-15 NOTE — Patient Instructions (Signed)
[facility]  Discharge Instructions: Thank you for choosing [facility] to provide your oncology and hematology care.  If you have a lab appointment with the Cancer Center - please note that after April 8th, 2024, all labs will be drawn in the cancer center.  You do not have to check in or register with the main entrance as you have in the past but will complete your check-in in the cancer center.  Wear comfortable clothing and clothing appropriate for easy access to any Portacath or PICC line.   We strive to give you quality time with your provider. You may need to reschedule your appointment if you arrive late (15 or more minutes).  Arriving late affects you and other patients whose appointments are after yours.  Also, if you miss three or more appointments without notifying the office, you may be dismissed from the clinic at the provider's discretion.      For prescription refill requests, have your pharmacy contact our office and allow 72 hours for refills to be completed.  To help prevent nausea and vomiting after your treatment, we encourage you to take your nausea medication as directed.  BELOW ARE SYMPTOMS THAT SHOULD BE REPORTED IMMEDIATELY: *FEVER GREATER THAN 100.4 F (38 C) OR HIGHER *CHILLS OR SWEATING *NAUSEA AND VOMITING THAT IS NOT CONTROLLED WITH YOUR NAUSEA MEDICATION *UNUSUAL SHORTNESS OF BREATH *UNUSUAL BRUISING OR BLEEDING *URINARY PROBLEMS (pain or burning when urinating, or frequent urination) *BOWEL PROBLEMS (unusual diarrhea, constipation, pain near the anus) TENDERNESS IN MOUTH AND THROAT WITH OR WITHOUT PRESENCE OF ULCERS (sore throat, sores in mouth, or a toothache) UNUSUAL RASH, SWELLING OR PAIN  UNUSUAL VAGINAL DISCHARGE OR ITCHING   Items with * indicate a potential emergency and should be followed up as soon as possible or go to the Emergency Department if any problems should occur.  Please show the CHEMOTHERAPY ALERT CARD or  IMMUNOTHERAPY ALERT CARD at check-in to the Emergency Department and triage nurse.  Should you have questions after your visit or need to cancel or reschedule your appointment, please contact [facility] [phone]  and follow the prompts.  Office hours are 8:00 a.m. to 4:30 p.m. Monday - Friday. Please note that voicemails left after 4:00 p.m. may not be returned until the following business day.  We are closed weekends and major holidays. You have access to a nurse at all times for urgent questions. Please call the main number to the clinic [phone] and follow the prompts.  For any non-urgent questions, you may also contact your provider using MyChart. We now offer e-Visits for anyone 18 and older to request care online for non-urgent symptoms. For details visit [email or web address].   Also download the MyChart app! Go to the app store, search "MyChart", open the app, select [facility], and log in with your MyChart username and password.   

## 2022-06-20 ENCOUNTER — Inpatient Hospital Stay: Payer: Medicaid Other | Admitting: Hematology

## 2022-06-20 ENCOUNTER — Inpatient Hospital Stay: Payer: Medicaid Other

## 2022-06-22 ENCOUNTER — Telehealth: Payer: Self-pay

## 2022-06-22 ENCOUNTER — Other Ambulatory Visit: Payer: Self-pay

## 2022-06-22 ENCOUNTER — Inpatient Hospital Stay: Payer: Medicaid Other

## 2022-06-22 ENCOUNTER — Telehealth: Payer: Self-pay | Admitting: *Deleted

## 2022-06-22 ENCOUNTER — Ambulatory Visit: Payer: Self-pay

## 2022-06-22 DIAGNOSIS — R21 Rash and other nonspecific skin eruption: Secondary | ICD-10-CM

## 2022-06-22 DIAGNOSIS — C189 Malignant neoplasm of colon, unspecified: Secondary | ICD-10-CM

## 2022-06-22 MED ORDER — DOXYCYCLINE HYCLATE 100 MG PO TABS
100.0000 mg | ORAL_TABLET | Freq: Two times a day (BID) | ORAL | 6 refills | Status: DC
Start: 2022-06-22 — End: 2023-02-15

## 2022-06-22 NOTE — Telephone Encounter (Signed)
Patient called requesting a refill on Doxycycline for skin rash.  Described as fine, white bumps.  Reviewed with Dr. Ellin Saba with verbal order ok to refill with 6 refills and to take continuously.  Patient verbalized understanding.

## 2022-06-22 NOTE — Telephone Encounter (Signed)
Patient called to advise that she has a rash to the face and chest.  Questioned if it was chemo, however not likely, as she has not had treatment since 5/6. Will consult with Dr Ellin Saba regarding this.

## 2022-06-24 ENCOUNTER — Encounter: Payer: Self-pay | Admitting: Hematology

## 2022-06-24 ENCOUNTER — Encounter (HOSPITAL_COMMUNITY): Payer: Self-pay | Admitting: Hematology

## 2022-07-05 ENCOUNTER — Other Ambulatory Visit: Payer: Self-pay

## 2022-07-07 ENCOUNTER — Other Ambulatory Visit: Payer: Self-pay

## 2022-07-10 NOTE — Progress Notes (Signed)
[facility] [address], [place] [address]    Clinic Day:  07/11/2022  Referring physician: [name], MD  Patient Care Team: [name], MD as PCP - General (Family Medicine) [name], MD as Medical Oncologist (Medical Oncology) [name], RN as Oncology Nurse Navigator (Medical Oncology)   ASSESSMENT & PLAN:   Assessment: 1. 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  Biopsy consistent with moderately to poorly differentiated adenocarcinoma. - CTAP with contrast on 01/10/2021: Diffusely thickened, 10 cm segment of distal sigmoid colon with adjacent moderate inflammatory reaction, suspected 2 sites of small bowel fistulization to the diseased segment and a rim-enhancing low-density 2.5 x 1.8 cm stricture medial to the mid segment which could represent a necrotic lymph node or contained perforation/abscess.  There are bilateral mildly enlarged common iliac chain nodes.  Mildly dilated mid to lower abdominal small bowel segments.  Mildly prominent slightly steatotic liver. - Sigmoid colon resection, partial small bowel resection and Hartman's procedure on 03/26/2021: Moderately to poorly differentiated adenocarcinoma involving the subserosal adipose tissue and present at the margin.  9/9 lymph nodes positive for metastatic carcinoma.  Small bowel resection is positive for adenocarcinoma nodules in the serosal fat.  PT4PN2B.  MMR is preserved. - Operative report mentions obvious extension of the tumor outside the colon to the pelvic brim, unable to fully excise all of the tumor in the pelvis. - NGS testing: KRAS/NRAS negative, BRAF negative, HER2 negative, MSI-stable - PET scan on 04/29/2021: Multifocal hypermetabolic nodule, peritoneal and  anterior abdominal wall adenopathy.  Findings worrisome for peritoneal carcinomatosis.  No evidence of hepatic metastatic disease or distant mets. - FOLFOXIRI and Vectibix started on 05/17/2021 - We will see how she responds and will refer to Dr. Flonnie Hailstone for resection/HIPEC therapy.  Colonoscopy through stoma and rectal stump on 10/04/2021 was negative.   2. Social/family history: - She lives at home with her family.  She works as a Lawyer at [employer].  Non-smoker. - Paternal cousin had cancer.  Maternal aunt had cancer.    Plan: 1. 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 - She developed Vectibix skin rash and hair thinning.  No major GI side effects. - Reviewed labs today: Normal LFTs and creatinine.  CBC grossly normal.  CEA was 68.9. - Proceed with cycle 2 today and in 2 weeks.  RTC 4 weeks for follow-up.  2.  Hypokalemia: - Continue potassium 20 mEq twice daily.  3.  Peripheral neuropathy: - Numbness in the feet is stable.  Numbness in the fingertips is gone.  We will continue to avoid oxaliplatin at this time.  4.  Microcytic anemia: - Continue iron tablet daily.  5.  EGFR antibody induced rash: - Continue doxycycline 100 mg twice daily, started on 06/22/2022.  Continue to cortisone 1% lotion twice daily.  If any worsening, will consider clindamycin gel.    Orders Placed This Encounter  Procedures   Magnesium    Standing Status:   Future    Standing Expiration Date:   08/08/2023   CBC with Differential    Standing Status:   Future    Standing Expiration Date:   08/09/2023   Comprehensive metabolic panel  Standing Status:   Future    Standing Expiration Date:   08/09/2023   Magnesium    Standing Status:   Future    Standing Expiration Date:   08/22/2023   CBC with Differential    Standing Status:   Future    Standing Expiration Date:   08/23/2023    Comprehensive metabolic panel    Standing Status:   Future    Standing Expiration Date:   08/23/2023   Magnesium    Standing Status:   Future    Standing Expiration Date:   09/05/2023   CBC with Differential    Standing Status:   Future    Standing Expiration Date:   09/06/2023   Comprehensive metabolic panel    Standing Status:   Future    Standing Expiration Date:   09/06/2023   Magnesium    Standing Status:   Future    Standing Expiration Date:   09/19/2023   CBC with Differential    Standing Status:   Future    Standing Expiration Date:   09/20/2023   Comprehensive metabolic panel    Standing Status:   Future    Standing Expiration Date:   09/20/2023   CEA    Standing Status:   Standing    Number of Occurrences:   10    Standing Expiration Date:   07/11/2023      I,Katie Daubenspeck,acting as a scribe for [name], MD.,have documented all relevant documentation on the behalf of [name], MD,as directed by  [name], MD while in the presence of [name], MD.   I, [name] MD, have reviewed the above documentation for accuracy and completeness, and I agree with the above.   [name], MD   6/3/20245:49 PM  CHIEF COMPLAINT:   Diagnosis: stage IV sigmoid colon adenocarcinoma    Cancer Staging  Colon carcinoma [facility]) Staging form: Colon and Rectum, AJCC 8th Edition - Clinical stage from 04/06/2021: Stage IVC (cT4a, cN2b, pM1c) - Signed by [name], MD on 05/04/2021    Prior Therapy: Sigmoid colon resection, partial small bowel resection and Hartman's procedure on 03/26/2021   Current Therapy:  FOLFOXIRI + Panitumumab q14d    HISTORY OF PRESENT ILLNESS:   Oncology History  Colon carcinoma (HCC)  03/26/2021 Initial Diagnosis   Colon carcinoma (HCC)   04/06/2021 Cancer Staging   Staging form: Colon and Rectum, AJCC 8th Edition - Clinical stage from 04/06/2021: Stage IVC (cT4a, cN2b, pM1c) -  Signed by [name], MD on 05/04/2021 Histopathologic type: Adenocarcinoma, NOS Stage prefix: Initial diagnosis Total positive nodes: 9 Total nodes examined: 9 Tumor deposits (TD): Present Carcinoembryonic antigen (CEA) (ng/mL): 61.5 Perineural invasion (PNI): Present Microsatellite instability (MSI): Stable KRAS mutation: Negative NRAS mutation: Negative BRAF mutation: Negative   05/17/2021 - 10/08/2021 Chemotherapy   Patient is on Treatment Plan : COLORECTAL FOLFOXIRI + Panitumumab q14d     05/17/2021 -  Chemotherapy   Patient is on Treatment Plan : COLORECTAL FOLFIRI + VECTIBIX q14d        INTERVAL HISTORY:   Diana Fox is a 52 y.o. female presenting to clinic today for follow up of stage IV sigmoid colon adenocarcinoma. She was last seen by me on 06/07/22.  Today, she states that she is doing well overall. Her appetite level is at 100%. Her energy level is at 100%.  PAST MEDICAL HISTORY:   Past Medical History: Past Medical History:  Diagnosis Date   Anemia    Back pain  Hypertension    Port-A-Cath in place 05/10/2021    Surgical History: Past Surgical History:  Procedure Laterality Date   BIOPSY  03/02/2021   Procedure: BIOPSY;  Surgeon: Lanelle Bal, DO;  [facility];  Service: Endoscopy;;   BOWEL RESECTION N/A 03/26/2021   Procedure: SMALL BOWEL RESECTION;  Surgeon: Franky Macho, MD;  Location: [facility];  Service: General;  Laterality: N/A;   CHOLECYSTECTOMY     COLONOSCOPY WITH PROPOFOL N/A 10/04/2021   Procedure: COLONOSCOPY WITH PROPOFOL;  Surgeon: Lanelle Bal, DO;  Location: [facility];  Service: Endoscopy;  Laterality: N/A;  12:15PM, VIA OSTOMY   COLOSTOMY N/A 03/26/2021   Procedure: COLOSTOMY;  Surgeon: Franky Macho, MD;  Location: [facility];  Service: General;  Laterality: N/A;   ESOPHAGOGASTRODUODENOSCOPY (EGD) WITH PROPOFOL N/A 03/02/2021   Procedure: ESOPHAGOGASTRODUODENOSCOPY (EGD) WITH PROPOFOL;  Surgeon: Lanelle Bal, DO;   [facility];  Service: Endoscopy;  Laterality: N/A;   PARTIAL COLECTOMY N/A 03/26/2021   Procedure: PARTIAL COLECTOMY;  Surgeon: Franky Macho, MD;  Location: [facility];  Service: General;  Laterality: N/A;   PORTACATH PLACEMENT Left 04/14/2021   Procedure: INSERTION PORT-A-CATH;  Surgeon: Franky Macho, MD;  Location: [facility];  Service: General;  Laterality: Left;   SCLEROTHERAPY  03/02/2021   Procedure: Susa Day;  Surgeon: Lanelle Bal, DO;  [facility];  Service: Endoscopy;;   SIGMOIDOSCOPY  03/02/2021   Procedure: Daiva Huge;  Surgeon: Lanelle Bal, DO;  [facility]: [facility];  Service: Endoscopy;;    Social History: Social History   Socioeconomic History   Marital status: Married    Spouse name: Not on file   Number of children: Not on file   Years of education: Not on file   Highest education level: Not on file  Occupational History   Not on file  Tobacco Use   Smoking status: Never   Smokeless tobacco: Never  Vaping Use   Vaping Use: Never used  Substance and Sexual Activity   Alcohol use: No   Drug use: No   Sexual activity: Not on file  Other Topics Concern   Not on file  Social History Narrative   Not on file   Social Determinants of Health   Financial Resource Strain: High Risk (05/19/2021)   Overall Financial Resource Strain (CARDIA)    Difficulty of Paying Living Expenses: Hard  Food Insecurity: Not on file  Transportation Needs: Not on file  Physical Activity: Not on file  Stress: Not on file  Social Connections: Not on file  Intimate Partner Violence: Not on file    Family History: Family History  Problem Relation Age of Onset   Hypertension Mother    Hypertension Father    Colon cancer Neg Hx    Colon polyps Neg Hx    Inflammatory bowel disease Neg Hx     Current Medications:  Current Outpatient Medications:    amoxicillin-clavulanate (AUGMENTIN) 875-125 MG tablet, Take 1 tablet by mouth 2 (two) times  daily., Disp: , Rfl:    doxycycline (VIBRA-TABS) 100 MG tablet, Take 1 tablet (100 mg total) by mouth 2 (two) times daily., Disp: 90 tablet, Rfl: 6   ferrous sulfate 325 (65 FE) MG tablet, Take 1 tablet (325 mg total) by mouth daily with breakfast. TAKE 1 tablet BY MOUTH DAILY., Disp: 90 tablet, Rfl: 1   fexofenadine (ALLEGRA) 180 MG tablet, Take 1 tablet (180 mg total) by mouth daily., Disp: 30 tablet, Rfl: 0  fluorouracil CALGB 13086 2,400 mg/m2 in sodium chloride 0.9 % 150 mL, Inject 2,400 mg/m2 into the vein over 48 hr. Every 14 days, Disp: , Rfl:    FLUOROURACIL IV, Inject into the vein every 14 (fourteen) days., Disp: , Rfl:    fluticasone (FLONASE) 50 MCG/ACT nasal spray, Place 2 sprays into both nostrils daily., Disp: 16 g, Rfl: 12   hydrocortisone 1 % lotion, Apply 1 Application topically 2 (two) times daily., Disp: 118 mL, Rfl: 5   KLOR-CON M20 20 MEQ tablet, TAKE 1 TABLET (20 MEQ TOTAL) BY MOUTH IN THE MORNING, AT NOON, IN THE EVENING, AND AT BEDTIME., Disp: 360 tablet, Rfl: 1   lidocaine (XYLOCAINE) 2 % solution, Use as directed 10 mLs in the mouth or throat 4 (four) times daily as needed for mouth pain (Mix 1:1 with carafate and swish and spit)., Disp: 100 mL, Rfl: 3   magnesium oxide (MAG-OX) 400 (240 Mg) MG tablet, Take 1 tablet (400 mg total) by mouth 2 (two) times daily., Disp: 60 tablet, Rfl: 6   medroxyPROGESTERone (DEPO-PROVERA) 150 MG/ML injection, Inject 150 mg into the muscle every 3 (three) months., Disp: , Rfl:    metaxalone (SKELAXIN) 800 MG tablet, Take 800 mg by mouth 3 (three) times daily as needed., Disp: , Rfl:    metroNIDAZOLE (METROGEL) 0.75 % vaginal gel, Place vaginally at bedtime., Disp: , Rfl:    Multiple Vitamin (MULTIVITAMIN) tablet, Take 1 tablet by mouth daily., Disp: , Rfl:    naproxen (NAPROSYN) 500 MG tablet, Take 500 mg by mouth 2 (two) times daily., Disp: , Rfl:    nebivolol (BYSTOLIC) 10 MG tablet, Take 1 tablet (10 mg total) by mouth daily., Disp: 30  tablet, Rfl: 1   OXALIPLATIN IV, Inject into the vein every 14 (fourteen) days., Disp: , Rfl:    oxyCODONE (OXY IR/ROXICODONE) 5 MG immediate release tablet, Take by mouth., Disp: , Rfl:    Panitumumab (VECTIBIX IV), Inject into the vein every 14 (fourteen) days., Disp: , Rfl:    pantoprazole (PROTONIX) 40 MG tablet, Take 40 mg by mouth daily as needed (acid reflux)., Disp: , Rfl:    sucralfate (CARAFATE) 1 GM/10ML suspension, Take 10 mLs (1 g total) by mouth 4 (four) times daily as needed. Mix 1:1 with 2% lidocaine viscous solution and swish and spit, Disp: 420 mL, Rfl: 6 No current facility-administered medications for this visit.  Facility-Administered Medications Ordered in Other Visits:    fluorouracil (ADRUCIL) 4,450 mg in sodium chloride 0.9 % 61 mL chemo infusion, 2,400 mg/m2 (Order-Specific), Intravenous, 1 day or 1 dose, [name], MD, Infusion Verify at 07/11/22 1355   Allergies: Allergies  Allergen Reactions   Zithromax [Azithromycin] Hives    infusing arm began to swell , her face got red, and she broke out, there are bumps around where the IV site was infusing   Motrin [Ibuprofen] Itching   Sudafed [Pseudoephedrine Hcl] Other (See Comments)    Hypertension   Zestril [Lisinopril] Cough    REVIEW OF SYSTEMS:   Review of Systems  Constitutional:  Negative for chills, fatigue and fever.  HENT:   Negative for lump/mass, mouth sores, nosebleeds, sore throat and trouble swallowing.   Eyes:  Negative for eye problems.  Respiratory:  Negative for cough and shortness of breath.   Cardiovascular:  Negative for chest pain, leg swelling and palpitations.  Gastrointestinal:  Negative for abdominal pain, constipation, diarrhea, nausea and vomiting.  Genitourinary:  Negative for bladder incontinence, difficulty urinating, dysuria,  frequency, hematuria and nocturia.   Musculoskeletal:  Negative for arthralgias, back pain, flank pain, myalgias and neck pain.  Skin:  Positive  for rash. Negative for itching.  Neurological:  Positive for numbness. Negative for dizziness and headaches.  Hematological:  Does not bruise/bleed easily.  Psychiatric/Behavioral:  Negative for depression, sleep disturbance and suicidal ideas. The patient is not nervous/anxious.   All other systems reviewed and are negative.    VITALS:   There were no vitals taken for this visit.  Wt Readings from Last 3 Encounters:  07/11/22 182 lb 3.2 oz (82.6 kg)  06/13/22 184 lb 9.6 oz (83.7 kg)  06/07/22 183 lb (83 kg)    There is no height or weight on file to calculate BMI.  Performance status (ECOG): 1 - Symptomatic but completely ambulatory  PHYSICAL EXAM:   Physical Exam Vitals and nursing note reviewed. Exam conducted with a chaperone present.  Constitutional:      Appearance: Normal appearance.  Cardiovascular:     Rate and Rhythm: Normal rate and regular rhythm.     Pulses: Normal pulses.     Heart sounds: Normal heart sounds.  Pulmonary:     Effort: Pulmonary effort is normal.     Breath sounds: Normal breath sounds.  Abdominal:     Palpations: Abdomen is soft. There is no hepatomegaly, splenomegaly or mass.     Tenderness: There is no abdominal tenderness.  Musculoskeletal:     Right lower leg: No edema.     Left lower leg: No edema.  Lymphadenopathy:     Cervical: No cervical adenopathy.     Right cervical: No superficial, deep or posterior cervical adenopathy.    Left cervical: No superficial, deep or posterior cervical adenopathy.     Upper Body:     Right upper body: No supraclavicular or axillary adenopathy.     Left upper body: No supraclavicular or axillary adenopathy.  Neurological:     General: No focal deficit present.     Mental Status: She is alert and oriented to person, place, and time.  Psychiatric:        Mood and Affect: Mood normal.        Behavior: Behavior normal.     LABS:      Latest Ref Rng & Units 07/11/2022    8:13 AM 06/13/2022    7:58  AM 05/16/2022    1:57 PM  CBC  WBC 4.0 - 10.5 K/uL 7.5  7.4  6.5   Hemoglobin 12.0 - 15.0 g/dL 16.1  09.6  04.5   Hematocrit 36.0 - 46.0 % 35.6  35.3  33.6   Platelets 150 - 400 K/uL 323  292  287       Latest Ref Rng & Units 07/11/2022    8:13 AM 06/13/2022    7:58 AM 05/16/2022    1:57 PM  CMP  Glucose 70 - 99 mg/dL 409  811  93   BUN 6 - 20 mg/dL 7  7  9    Creatinine 0.44 - 1.00 mg/dL 9.14  7.82  9.56   Sodium 135 - 145 mmol/L 135  134  136   Potassium 3.5 - 5.1 mmol/L 3.4  3.4  3.5   Chloride 98 - 111 mmol/L 106  106  109   CO2 22 - 32 mmol/L 21  21  22    Calcium 8.9 - 10.3 mg/dL 8.5  8.5  8.7   Total Protein 6.5 - 8.1 g/dL 7.0  7.0  7.0   Total Bilirubin 0.3 - 1.2 mg/dL 0.5  0.5  0.4   Alkaline Phos 38 - 126 U/L 58  50  55   AST 15 - 41 U/L 17  23  17    ALT 0 - 44 U/L 12  12  14       Lab Results  Component Value Date   CEA1 68.9 (H) 06/13/2022   /  CEA  Date Value Ref Range Status  06/13/2022 68.9 (H) 0.0 - 4.7 ng/mL Final    Comment:    (NOTE)                             Nonsmokers          <3.9                             Smokers             <5.6 [employer] Electrochemiluminescence Immunoassay (ECLIA) Values obtained with different assay methods or kits cannot be used interchangeably.  Results cannot be interpreted as absolute evidence of the presence or absence of malignant disease. Performed At: [facility] [address] [place], [place] [id number] Jolene Schimke MD NW:[id number]    No results found for: "PSA1" No results found for: "CAN199" No results found for: "CAN125"  No results found for: "TOTALPROTELP", "ALBUMINELP", "A1GS", "A2GS", "BETS", "BETA2SER", "GAMS", "MSPIKE", "SPEI" Lab Results  Component Value Date   TIBC 256 01/11/2021   FERRITIN 108 01/11/2021   IRONPCTSAT 4 (L) 01/11/2021   No results found for: "LDH"   STUDIES:   No results found.

## 2022-07-11 ENCOUNTER — Inpatient Hospital Stay: Payer: Medicaid Other

## 2022-07-11 ENCOUNTER — Inpatient Hospital Stay: Payer: Medicaid Other | Attending: Hematology

## 2022-07-11 ENCOUNTER — Inpatient Hospital Stay (HOSPITAL_BASED_OUTPATIENT_CLINIC_OR_DEPARTMENT_OTHER): Payer: Medicaid Other | Admitting: Hematology

## 2022-07-11 VITALS — BP 148/83 | HR 73 | Temp 98.5°F | Resp 18

## 2022-07-11 DIAGNOSIS — I10 Essential (primary) hypertension: Secondary | ICD-10-CM | POA: Insufficient documentation

## 2022-07-11 DIAGNOSIS — Z79899 Other long term (current) drug therapy: Secondary | ICD-10-CM | POA: Diagnosis not present

## 2022-07-11 DIAGNOSIS — G629 Polyneuropathy, unspecified: Secondary | ICD-10-CM | POA: Diagnosis not present

## 2022-07-11 DIAGNOSIS — Z809 Family history of malignant neoplasm, unspecified: Secondary | ICD-10-CM | POA: Insufficient documentation

## 2022-07-11 DIAGNOSIS — C189 Malignant neoplasm of colon, unspecified: Secondary | ICD-10-CM | POA: Diagnosis not present

## 2022-07-11 DIAGNOSIS — Z793 Long term (current) use of hormonal contraceptives: Secondary | ICD-10-CM | POA: Diagnosis not present

## 2022-07-11 DIAGNOSIS — Z95828 Presence of other vascular implants and grafts: Secondary | ICD-10-CM | POA: Diagnosis not present

## 2022-07-11 DIAGNOSIS — C187 Malignant neoplasm of sigmoid colon: Secondary | ICD-10-CM | POA: Diagnosis not present

## 2022-07-11 DIAGNOSIS — R21 Rash and other nonspecific skin eruption: Secondary | ICD-10-CM | POA: Diagnosis not present

## 2022-07-11 DIAGNOSIS — Z792 Long term (current) use of antibiotics: Secondary | ICD-10-CM | POA: Insufficient documentation

## 2022-07-11 DIAGNOSIS — D509 Iron deficiency anemia, unspecified: Secondary | ICD-10-CM | POA: Insufficient documentation

## 2022-07-11 DIAGNOSIS — E876 Hypokalemia: Secondary | ICD-10-CM | POA: Diagnosis not present

## 2022-07-11 DIAGNOSIS — Z791 Long term (current) use of non-steroidal anti-inflammatories (NSAID): Secondary | ICD-10-CM | POA: Insufficient documentation

## 2022-07-11 DIAGNOSIS — Z5111 Encounter for antineoplastic chemotherapy: Secondary | ICD-10-CM | POA: Insufficient documentation

## 2022-07-11 LAB — COMPREHENSIVE METABOLIC PANEL
ALT: 12 U/L (ref 0–44)
AST: 17 U/L (ref 15–41)
Albumin: 3.6 g/dL (ref 3.5–5.0)
Alkaline Phosphatase: 58 U/L (ref 38–126)
Anion gap: 8 (ref 5–15)
BUN: 7 mg/dL (ref 6–20)
CO2: 21 mmol/L — ABNORMAL LOW (ref 22–32)
Calcium: 8.5 mg/dL — ABNORMAL LOW (ref 8.9–10.3)
Chloride: 106 mmol/L (ref 98–111)
Creatinine, Ser: 0.85 mg/dL (ref 0.44–1.00)
GFR, Estimated: >60 mL/min (ref >60)
Glucose, Bld: 109 mg/dL — ABNORMAL HIGH (ref 70–99)
Potassium: 3.4 mmol/L — ABNORMAL LOW (ref 3.5–5.1)
Sodium: 135 mmol/L (ref 135–145)
Total Bilirubin: 0.5 mg/dL (ref 0.3–1.2)
Total Protein: 7 g/dL (ref 6.5–8.1)

## 2022-07-11 LAB — CBC WITH DIFFERENTIAL/PLATELET
Abs Immature Granulocytes: 0.04 10*3/uL (ref 0.00–0.07)
Basophils Absolute: 0.1 10*3/uL (ref 0.0–0.1)
Basophils Relative: 1 %
Eosinophils Absolute: 0.1 10*3/uL (ref 0.0–0.5)
Eosinophils Relative: 1 %
HCT: 35.6 % — ABNORMAL LOW (ref 36.0–46.0)
Hemoglobin: 11.8 g/dL — ABNORMAL LOW (ref 12.0–15.0)
Immature Granulocytes: 1 %
Lymphocytes Relative: 35 %
Lymphs Abs: 2.6 10*3/uL (ref 0.7–4.0)
MCH: 28.9 pg (ref 26.0–34.0)
MCHC: 33.1 g/dL (ref 30.0–36.0)
MCV: 87 fL (ref 80.0–100.0)
Monocytes Absolute: 0.7 10*3/uL (ref 0.1–1.0)
Monocytes Relative: 10 %
Neutro Abs: 4 10*3/uL (ref 1.7–7.7)
Neutrophils Relative %: 52 %
Platelets: 323 10*3/uL (ref 150–400)
RBC: 4.09 MIL/uL (ref 3.87–5.11)
RDW: 14.8 % (ref 11.5–15.5)
WBC: 7.5 10*3/uL (ref 4.0–10.5)
nRBC: 0 % (ref 0.0–0.2)

## 2022-07-11 LAB — MAGNESIUM: Magnesium: 1.7 mg/dL (ref 1.7–2.4)

## 2022-07-11 MED ORDER — SODIUM CHLORIDE 0.9 % IV SOLN
2400.0000 mg/m2 | INTRAVENOUS | Status: DC
  Administered 2022-07-11: 4450 mg via INTRAVENOUS
  Filled 2022-07-11: qty 89

## 2022-07-11 MED ORDER — SODIUM CHLORIDE 0.9% FLUSH
10.0000 mL | Freq: Once | INTRAVENOUS | Status: AC
  Administered 2022-07-11: 10 mL via INTRAVENOUS

## 2022-07-11 MED ORDER — SODIUM CHLORIDE 0.9 % IV SOLN
10.0000 mg | Freq: Once | INTRAVENOUS | Status: AC
  Administered 2022-07-11: 10 mg via INTRAVENOUS
  Filled 2022-07-11: qty 1

## 2022-07-11 MED ORDER — SODIUM CHLORIDE 0.9 % IV SOLN
500.0000 mg | Freq: Once | INTRAVENOUS | Status: AC
  Administered 2022-07-11: 500 mg via INTRAVENOUS
  Filled 2022-07-11: qty 20

## 2022-07-11 MED ORDER — SODIUM CHLORIDE 0.9 % IV SOLN
150.0000 mg | Freq: Once | INTRAVENOUS | Status: AC
  Administered 2022-07-11: 150 mg via INTRAVENOUS
  Filled 2022-07-11: qty 150

## 2022-07-11 MED ORDER — ATROPINE SULFATE 1 MG/ML IV SOLN
0.5000 mg | Freq: Once | INTRAVENOUS | Status: AC
  Administered 2022-07-11: 0.5 mg via INTRAVENOUS
  Filled 2022-07-11: qty 1

## 2022-07-11 MED ORDER — FLUOROURACIL CHEMO INJECTION 2.5 GM/50ML
400.0000 mg/m2 | Freq: Once | INTRAVENOUS | Status: AC
  Administered 2022-07-11: 750 mg via INTRAVENOUS
  Filled 2022-07-11: qty 15

## 2022-07-11 MED ORDER — POTASSIUM CHLORIDE CRYS ER 20 MEQ PO TBCR
40.0000 meq | EXTENDED_RELEASE_TABLET | Freq: Once | ORAL | Status: AC
  Administered 2022-07-11: 40 meq via ORAL
  Filled 2022-07-11: qty 2

## 2022-07-11 MED ORDER — SODIUM CHLORIDE 0.9 % IV SOLN
165.0000 mg/m2 | Freq: Once | INTRAVENOUS | Status: AC
  Administered 2022-07-11: 300 mg via INTRAVENOUS
  Filled 2022-07-11: qty 15

## 2022-07-11 MED ORDER — LEUCOVORIN CALCIUM INJECTION 350 MG
200.0000 mg/m2 | Freq: Once | INTRAVENOUS | Status: AC
  Administered 2022-07-11: 370 mg via INTRAVENOUS
  Filled 2022-07-11: qty 18.5

## 2022-07-11 MED ORDER — PALONOSETRON HCL INJECTION 0.25 MG/5ML
0.2500 mg | Freq: Once | INTRAVENOUS | Status: AC
  Administered 2022-07-11: 0.25 mg via INTRAVENOUS
  Filled 2022-07-11: qty 5

## 2022-07-11 MED ORDER — SODIUM CHLORIDE 0.9 % IV SOLN: Freq: Once | INTRAVENOUS | Status: AC

## 2022-07-11 NOTE — Patient Instructions (Signed)
[facility] Discharge Instructions   You were seen and examined today by Dr. Katragadda.  He reviewed the results of your lab work which are normal/stable.   We will proceed with your treatment today.  Return as scheduled.    Thank you for choosing  [facility] Center at [facility] to provide your oncology and hematology care.  To afford each patient quality time with our provider, please arrive at least 15 minutes before your scheduled appointment time.   If you have a lab appointment with the Cancer Center please come in thru the Main Entrance and check in at the main information desk.  You need to re-schedule your appointment should you arrive 10 or more minutes late.  We strive to give you quality time with our providers, and arriving late affects you and other patients whose appointments are after yours.  Also, if you no show three or more times for appointments you may be dismissed from the clinic at the providers discretion.     Again, thank you for choosing [facility].  Our hope is that these requests will decrease the amount of time that you wait before being seen by our physicians.       _____________________________________________________________  Should you have questions after your visit to [facility], please contact our office at [phone] and follow the prompts.  Our office hours are 8:00 a.m. and 4:30 p.m. Monday - Friday.  Please note that voicemails left after 4:00 p.m. may not be returned until the following business day.  We are closed weekends and major holidays.  You do have access to a nurse 24-7, just call the main number to the clinic [phone] and do not press any options, hold on the line and a nurse will answer the phone.    For prescription refill requests, have your pharmacy contact our office and allow 72 hours.    Due to Covid, you will need to wear a mask upon entering  the hospital. If you do not have a mask, a mask will be given to you at the Main Entrance upon arrival. For doctor visits, patients may have 1 support person age 18 or older with them. For treatment visits, patients can not have anyone with them due to social distancing guidelines and our immunocompromised population.      

## 2022-07-11 NOTE — Progress Notes (Addendum)
Atropine 0.5mg  IV and Potassium Chloride 40 MEQ po orders added per MD/RN request. Fluorouracil CIV diluted in NS. NS Lot:  [id number], Exp:  10/08/2023  Richardean Sale, RPH, BCPS, BCOP 07/11/2022 3:10 PM

## 2022-07-11 NOTE — Progress Notes (Signed)
Patient has been examined by Dr. Katragadda. Vital signs and labs have been reviewed by MD - ANC, Creatinine, LFTs, hemoglobin, and platelets are within treatment parameters per M.D. - pt may proceed with treatment.  Primary RN and pharmacy notified.  

## 2022-07-11 NOTE — Progress Notes (Signed)
Patient presents today for Vectibix and Folfiri infusion. Patient is in satisfactory condition with no new complaints voiced.  Vital signs are stable.  Labs reviewed by Dr. Ellin Saba during the office visit and all labs are within treatment parameters. Pt's potassium is 3.4 today, pt will receive 40 mEq potassium chloride p.o x 1 dose per Dr.K's standing orders. We will proceed with treatment per MD orders.   Treatment given today per MD orders. Tolerated infusion without adverse affects. Vital signs stable. No complaints at this time. Discharged from clinic ambulatory in stable condition. Alert and oriented x 3. F/U with [facility] as scheduled. 5FU ambulatory pump infusing.

## 2022-07-11 NOTE — Patient Instructions (Signed)
[facility]  Discharge Instructions: Thank you for choosing [facility] to provide your oncology and hematology care.  If you have a lab appointment with the Cancer Center - please note that after April 8th, 2024, all labs will be drawn in the cancer center.  You do not have to check in or register with the main entrance as you have in the past but will complete your check-in in the cancer center.  Wear comfortable clothing and clothing appropriate for easy access to any Portacath or PICC line.   We strive to give you quality time with your provider. You may need to reschedule your appointment if you arrive late (15 or more minutes).  Arriving late affects you and other patients whose appointments are after yours.  Also, if you miss three or more appointments without notifying the office, you may be dismissed from the clinic at the provider's discretion.      For prescription refill requests, have your pharmacy contact our office and allow 72 hours for refills to be completed.    Today you received the following chemotherapy and/or immunotherapy agents Vectibix and Folfiri   To help prevent nausea and vomiting after your treatment, we encourage you to take your nausea medication as directed.  Panitumumab Injection What is this medication? PANITUMUMAB (pan i TOOM ue mab) treats colorectal cancer. It works by blocking a protein that causes cancer cells to grow and multiply. This helps to slow or stop the spread of cancer cells. It is a monoclonal antibody. This medicine may be used for other purposes; ask your health care provider or pharmacist if you have questions. COMMON BRAND NAME(S): Vectibix What should I tell my care team before I take this medication? They need to know if you have any of these conditions: Eye disease Low levels of magnesium in the blood Lung disease An unusual or allergic reaction to panitumumab, other medications, foods, dyes, or  preservatives Pregnant or trying to get pregnant Breast-feeding How should I use this medication? This medication is injected into a vein. It is given by your care team in a hospital or clinic setting. Talk to your care team about the use of this medication in children. Special care may be needed. Overdosage: If you think you have taken too much of this medicine contact a poison control center or emergency room at once. NOTE: This medicine is only for you. Do not share this medicine with others. What if I miss a dose? Keep appointments for follow-up doses. It is important not to miss your dose. Call your care team if you are unable to keep an appointment. What may interact with this medication? Bevacizumab This list may not describe all possible interactions. Give your health care provider a list of all the medicines, herbs, non-prescription drugs, or dietary supplements you use. Also tell them if you smoke, drink alcohol, or use illegal drugs. Some items may interact with your medicine. What should I watch for while using this medication? Your condition will be monitored carefully while you are receiving this medication. This medication may make you feel generally unwell. This is not uncommon as chemotherapy can affect healthy cells as well as cancer cells. Report any side effects. Continue your course of treatment even though you feel ill unless your care team tells you to stop. This medication can make you more sensitive to the sun. Keep out of the sun while receiving this medication and for 2 months after stopping therapy.  If you cannot avoid being in the sun, wear protective clothing and sunscreen. Do not use sun lamps, tanning beds, or tanning booths. Check with your care team if you have severe diarrhea, nausea, and vomiting or if you sweat a lot. The loss of too much body fluid may make it dangerous for you to take this medication. This medication may cause serious skin reactions. They can  happen weeks to months after starting the medication. Contact your care team right away if you notice fevers or flu-like symptoms with a rash. The rash may be red or purple and then turn into blisters or peeling of the skin. You may also notice a red rash with swelling of the face, lips, or lymph nodes in your neck or under your arms. Talk to your care team if you may be pregnant. Serious birth defects can occur if you take this medication during pregnancy and for 2 months after the last dose. Contraception is recommended while taking this medication and for 2 months after the last dose. Your care team can help you find the option that works for you. Do not breastfeed while taking this medication and for 2 months after the last dose. This medication may cause infertility. Talk to your care team if you are concerned about your fertility. What side effects may I notice from receiving this medication? Side effects that you should report to your care team as soon as possible: Allergic reactions--skin rash, itching, hives, swelling of the face, lips, tongue, or throat Dry cough, shortness of breath or trouble breathing Eye pain, redness, irritation, or discharge with blurry or decreased vision Infusion reactions--chest pain, shortness of breath or trouble breathing, feeling faint or lightheaded Low magnesium level--muscle pain or cramps, unusual weakness or fatigue, fast or irregular heartbeat, tremors Low potassium level--muscle pain or cramps, unusual weakness or fatigue, fast or irregular heartbeat, constipation Redness, blistering, peeling, or loosening of the skin, including inside the mouth Skin reactions on sun-exposed areas Side effects that usually do not require medical attention (report to your care team if they continue or are bothersome): Change in nail shape, thickness, or color Diarrhea Dry skin Fatigue Nausea Vomiting This list may not describe all possible side effects. Call your  doctor for medical advice about side effects. You may report side effects to FDA at 1-800-FDA-1088. Where should I keep my medication? This medication is given in a hospital or clinic. It will not be stored at home. NOTE: This sheet is a summary. It may not cover all possible information. If you have questions about this medicine, talk to your doctor, pharmacist, or health care provider.  2024 Elsevier/Gold Standard (2021-06-09 00:00:00)  Irinotecan Injection What is this medication? IRINOTECAN (ir in oh TEE kan) treats some types of cancer. It works by slowing down the growth of cancer cells. This medicine may be used for other purposes; ask your health care provider or pharmacist if you have questions. COMMON BRAND NAME(S): Camptosar What should I tell my care team before I take this medication? They need to know if you have any of these conditions: Dehydration Diarrhea Infection, especially a viral infection, such as chickenpox, cold sores, herpes Liver disease Low blood cell levels (white cells, red cells, and platelets) Low levels of electrolytes, such as calcium, magnesium, or potassium in your blood Recent or ongoing radiation An unusual or allergic reaction to irinotecan, other medications, foods, dyes, or preservatives If you or your partner are pregnant or trying to get pregnant Breast-feeding How  should I use this medication? This medication is injected into a vein. It is given by your care team in a hospital or clinic setting. Talk to your care team about the use of this medication in children. Special care may be needed. Overdosage: If you think you have taken too much of this medicine contact a poison control center or emergency room at once. NOTE: This medicine is only for you. Do not share this medicine with others. What if I miss a dose? Keep appointments for follow-up doses. It is important not to miss your dose. Call your care team if you are unable to keep an  appointment. What may interact with this medication? Do not take this medication with any of the following: Cobicistat Itraconazole This medication may also interact with the following: Certain antibiotics, such as clarithromycin, rifampin, rifabutin Certain antivirals for HIV or AIDS Certain medications for fungal infections, such as ketoconazole, posaconazole, voriconazole Certain medications for seizures, such as carbamazepine, phenobarbital, phenytoin Gemfibrozil Nefazodone St. John's wort This list may not describe all possible interactions. Give your health care provider a list of all the medicines, herbs, non-prescription drugs, or dietary supplements you use. Also tell them if you smoke, drink alcohol, or use illegal drugs. Some items may interact with your medicine. What should I watch for while using this medication? Your condition will be monitored carefully while you are receiving this medication. You may need blood work while taking this medication. This medication may make you feel generally unwell. This is not uncommon as chemotherapy can affect healthy cells as well as cancer cells. Report any side effects. Continue your course of treatment even though you feel ill unless your care team tells you to stop. This medication can cause serious side effects. To reduce the risk, your care team may give you other medications to take before receiving this one. Be sure to follow the directions from your care team. This medication may affect your coordination, reaction time, or judgement. Do not drive or operate machinery until you know how this medication affects you. Sit up or stand slowly to reduce the risk of dizzy or fainting spells. Drinking alcohol with this medication can increase the risk of these side effects. This medication may increase your risk of getting an infection. Call your care team for advice if you get a fever, chills, sore throat, or other symptoms of a cold or flu. Do  not treat yourself. Try to avoid being around people who are sick. Avoid taking medications that contain aspirin, acetaminophen, ibuprofen, naproxen, or ketoprofen unless instructed by your care team. These medications may hide a fever. This medication may increase your risk to bruise or bleed. Call your care team if you notice any unusual bleeding. Be careful brushing or flossing your teeth or using a toothpick because you may get an infection or bleed more easily. If you have any dental work done, tell your dentist you are receiving this medication. Talk to your care team if you or your partner are pregnant or think either of you might be pregnant. This medication can cause serious birth defects if taken during pregnancy and for 6 months after the last dose. You will need a negative pregnancy test before starting this medication. Contraception is recommended while taking this medication and for 6 months after the last dose. Your care team can help you find the option that works for you. Do not father a child while taking this medication and for 3 months after the last  dose. Use a condom for contraception during this time period. Do not breastfeed while taking this medication and for 7 days after the last dose. This medication may cause infertility. Talk to your care team if you are concerned about your fertility. What side effects may I notice from receiving this medication? Side effects that you should report to your care team as soon as possible: Allergic reactions--skin rash, itching, hives, swelling of the face, lips, tongue, or throat Dry cough, shortness of breath or trouble breathing Increased saliva or tears, increased sweating, stomach cramping, diarrhea, small pupils, unusual weakness or fatigue, slow heartbeat Infection--fever, chills, cough, sore throat, wounds that don't heal, pain or trouble when passing urine, general feeling of discomfort or being unwell Kidney injury--decrease in the  amount of urine, swelling of the ankles, hands, or feet Low red blood cell level--unusual weakness or fatigue, dizziness, headache, trouble breathing Severe or prolonged diarrhea Unusual bruising or bleeding Side effects that usually do not require medical attention (report to your care team if they continue or are bothersome): Constipation Diarrhea Hair loss Loss of appetite Nausea Stomach pain This list may not describe all possible side effects. Call your doctor for medical advice about side effects. You may report side effects to FDA at 1-800-FDA-1088. Where should I keep my medication? This medication is given in a hospital or clinic. It will not be stored at home. NOTE: This sheet is a summary. It may not cover all possible information. If you have questions about this medicine, talk to your doctor, pharmacist, or health care provider.  2024 Elsevier/Gold Standard (2021-06-07 00:00:00)  Leucovorin Injection What is this medication? LEUCOVORIN (loo koe VOR in) prevents side effects from certain medications, such as methotrexate. It works by increasing folate levels. This helps protect healthy cells in your body. It may also be used to treat anemia caused by low levels of folate. It can also be used with fluorouracil, a type of chemotherapy, to treat colorectal cancer. It works by increasing the effects of fluorouracil in the body. This medicine may be used for other purposes; ask your health care provider or pharmacist if you have questions. What should I tell my care team before I take this medication? They need to know if you have any of these conditions: Anemia from low levels of vitamin B12 in the blood An unusual or allergic reaction to leucovorin, folic acid, other medications, foods, dyes, or preservatives Pregnant or trying to get pregnant Breastfeeding How should I use this medication? This medication is injected into a vein or a muscle. It is given by your care team in a  hospital or clinic setting. Talk to your care team about the use of this medication in children. Special care may be needed. Overdosage: If you think you have taken too much of this medicine contact a poison control center or emergency room at once. NOTE: This medicine is only for you. Do not share this medicine with others. What if I miss a dose? Keep appointments for follow-up doses. It is important not to miss your dose. Call your care team if you are unable to keep an appointment. What may interact with this medication? Capecitabine Fluorouracil Phenobarbital Phenytoin Primidone Trimethoprim;sulfamethoxazole This list may not describe all possible interactions. Give your health care provider a list of all the medicines, herbs, non-prescription drugs, or dietary supplements you use. Also tell them if you smoke, drink alcohol, or use illegal drugs. Some items may interact with your medicine. What should I  watch for while using this medication? Your condition will be monitored carefully while you are receiving this medication. This medication may increase the side effects of 5-fluorouracil. Tell your care team if you have diarrhea or mouth sores that do not get better or that get worse. What side effects may I notice from receiving this medication? Side effects that you should report to your care team as soon as possible: Allergic reactions--skin rash, itching, hives, swelling of the face, lips, tongue, or throat This list may not describe all possible side effects. Call your doctor for medical advice about side effects. You may report side effects to FDA at 1-800-FDA-1088. Where should I keep my medication? This medication is given in a hospital or clinic. It will not be stored at home. NOTE: This sheet is a summary. It may not cover all possible information. If you have questions about this medicine, talk to your doctor, pharmacist, or health care provider.  2024 Elsevier/Gold Standard  (2021-06-29 00:00:00)  Fluorouracil Injection What is this medication? FLUOROURACIL (flure oh YOOR a sil) treats some types of cancer. It works by slowing down the growth of cancer cells. This medicine may be used for other purposes; ask your health care provider or pharmacist if you have questions. COMMON BRAND NAME(S): Adrucil What should I tell my care team before I take this medication? They need to know if you have any of these conditions: Blood disorders Dihydropyrimidine dehydrogenase (DPD) deficiency Infection, such as chickenpox, cold sores, herpes Kidney disease Liver disease Poor nutrition Recent or ongoing radiation therapy An unusual or allergic reaction to fluorouracil, other medications, foods, dyes, or preservatives If you or your partner are pregnant or trying to get pregnant Breast-feeding How should I use this medication? This medication is injected into a vein. It is administered by your care team in a hospital or clinic setting. Talk to your care team about the use of this medication in children. Special care may be needed. Overdosage: If you think you have taken too much of this medicine contact a poison control center or emergency room at once. NOTE: This medicine is only for you. Do not share this medicine with others. What if I miss a dose? Keep appointments for follow-up doses. It is important not to miss your dose. Call your care team if you are unable to keep an appointment. What may interact with this medication? Do not take this medication with any of the following: Live virus vaccines This medication may also interact with the following: Medications that treat or prevent blood clots, such as warfarin, enoxaparin, dalteparin This list may not describe all possible interactions. Give your health care provider a list of all the medicines, herbs, non-prescription drugs, or dietary supplements you use. Also tell them if you smoke, drink alcohol, or use illegal  drugs. Some items may interact with your medicine. What should I watch for while using this medication? Your condition will be monitored carefully while you are receiving this medication. This medication may make you feel generally unwell. This is not uncommon as chemotherapy can affect healthy cells as well as cancer cells. Report any side effects. Continue your course of treatment even though you feel ill unless your care team tells you to stop. In some cases, you may be given additional medications to help with side effects. Follow all directions for their use. This medication may increase your risk of getting an infection. Call your care team for advice if you get a fever, chills, sore throat,  or other symptoms of a cold or flu. Do not treat yourself. Try to avoid being around people who are sick. This medication may increase your risk to bruise or bleed. Call your care team if you notice any unusual bleeding. Be careful brushing or flossing your teeth or using a toothpick because you may get an infection or bleed more easily. If you have any dental work done, tell your dentist you are receiving this medication. Avoid taking medications that contain aspirin, acetaminophen, ibuprofen, naproxen, or ketoprofen unless instructed by your care team. These medications may hide a fever. Do not treat diarrhea with over the counter products. Contact your care team if you have diarrhea that lasts more than 2 days or if it is severe and watery. This medication can make you more sensitive to the sun. Keep out of the sun. If you cannot avoid being in the sun, wear protective clothing and sunscreen. Do not use sun lamps, tanning beds, or tanning booths. Talk to your care team if you or your partner wish to become pregnant or think you might be pregnant. This medication can cause serious birth defects if taken during pregnancy and for 3 months after the last dose. A reliable form of contraception is recommended while  taking this medication and for 3 months after the last dose. Talk to your care team about effective forms of contraception. Do not father a child while taking this medication and for 3 months after the last dose. Use a condom while having sex during this time period. Do not breastfeed while taking this medication. This medication may cause infertility. Talk to your care team if you are concerned about your fertility. What side effects may I notice from receiving this medication? Side effects that you should report to your care team as soon as possible: Allergic reactions--skin rash, itching, hives, swelling of the face, lips, tongue, or throat Heart attack--pain or tightness in the chest, shoulders, arms, or jaw, nausea, shortness of breath, cold or clammy skin, feeling faint or lightheaded Heart failure--shortness of breath, swelling of the ankles, feet, or hands, sudden weight gain, unusual weakness or fatigue Heart rhythm changes--fast or irregular heartbeat, dizziness, feeling faint or lightheaded, chest pain, trouble breathing High ammonia level--unusual weakness or fatigue, confusion, loss of appetite, nausea, vomiting, seizures Infection--fever, chills, cough, sore throat, wounds that don't heal, pain or trouble when passing urine, general feeling of discomfort or being unwell Low red blood cell level--unusual weakness or fatigue, dizziness, headache, trouble breathing Pain, tingling, or numbness in the hands or feet, muscle weakness, change in vision, confusion or trouble speaking, loss of balance or coordination, trouble walking, seizures Redness, swelling, and blistering of the skin over hands and feet Severe or prolonged diarrhea Unusual bruising or bleeding Side effects that usually do not require medical attention (report to your care team if they continue or are bothersome): Dry skin Headache Increased tears Nausea Pain, redness, or swelling with sores inside the mouth or  throat Sensitivity to light Vomiting This list may not describe all possible side effects. Call your doctor for medical advice about side effects. You may report side effects to FDA at 1-800-FDA-1088. Where should I keep my medication? This medication is given in a hospital or clinic. It will not be stored at home. NOTE: This sheet is a summary. It may not cover all possible information. If you have questions about this medicine, talk to your doctor, pharmacist, or health care provider.  2024 Elsevier/Gold Standard (2021-06-01 00:00:00)  BELOW ARE SYMPTOMS THAT SHOULD BE REPORTED IMMEDIATELY: *FEVER GREATER THAN 100.4 F (38 C) OR HIGHER *CHILLS OR SWEATING *NAUSEA AND VOMITING THAT IS NOT CONTROLLED WITH YOUR NAUSEA MEDICATION *UNUSUAL SHORTNESS OF BREATH *UNUSUAL BRUISING OR BLEEDING *URINARY PROBLEMS (pain or burning when urinating, or frequent urination) *BOWEL PROBLEMS (unusual diarrhea, constipation, pain near the anus) TENDERNESS IN MOUTH AND THROAT WITH OR WITHOUT PRESENCE OF ULCERS (sore throat, sores in mouth, or a toothache) UNUSUAL RASH, SWELLING OR PAIN  UNUSUAL VAGINAL DISCHARGE OR ITCHING   Items with * indicate a potential emergency and should be followed up as soon as possible or go to the Emergency Department if any problems should occur.  Please show the CHEMOTHERAPY ALERT CARD or IMMUNOTHERAPY ALERT CARD at check-in to the Emergency Department and triage nurse.  Should you have questions after your visit or need to cancel or reschedule your appointment, please contact [facility] [phone]  and follow the prompts.  Office hours are 8:00 a.m. to 4:30 p.m. Monday - Friday. Please note that voicemails left after 4:00 p.m. may not be returned until the following business day.  We are closed weekends and major holidays. You have access to a nurse at all times for urgent questions. Please call the main number to the clinic [phone] and  follow the prompts.  For any non-urgent questions, you may also contact your provider using MyChart. We now offer e-Visits for anyone 79 and older to request care online for non-urgent symptoms. For details visit [email or web address].   Also download the MyChart app! Go to the app store, search "MyChart", open the app, select [facility], and log in with your MyChart username and password.

## 2022-07-12 ENCOUNTER — Other Ambulatory Visit: Payer: Self-pay

## 2022-07-13 ENCOUNTER — Ambulatory Visit: Payer: Medicaid Other | Admitting: Hematology

## 2022-07-13 ENCOUNTER — Inpatient Hospital Stay: Payer: Medicaid Other

## 2022-07-13 VITALS — BP 133/79 | HR 71 | Temp 98.4°F | Resp 18

## 2022-07-13 DIAGNOSIS — C189 Malignant neoplasm of colon, unspecified: Secondary | ICD-10-CM

## 2022-07-13 DIAGNOSIS — Z5111 Encounter for antineoplastic chemotherapy: Secondary | ICD-10-CM | POA: Diagnosis not present

## 2022-07-13 DIAGNOSIS — Z95828 Presence of other vascular implants and grafts: Secondary | ICD-10-CM

## 2022-07-13 MED ORDER — SODIUM CHLORIDE 0.9% FLUSH
10.0000 mL | INTRAVENOUS | Status: DC | PRN
  Administered 2022-07-13: 10 mL

## 2022-07-13 MED ORDER — HEPARIN SOD (PORK) LOCK FLUSH 100 UNIT/ML IV SOLN
500.0000 [IU] | Freq: Once | INTRAVENOUS | Status: AC | PRN
  Administered 2022-07-13: 500 [IU]

## 2022-07-13 NOTE — Patient Instructions (Signed)
[facility]  Discharge Instructions: Thank you for choosing [facility] to provide your oncology and hematology care.  If you have a lab appointment with the Cancer Center - please note that after April 8th, 2024, all labs will be drawn in the cancer center.  You do not have to check in or register with the main entrance as you have in the past but will complete your check-in in the cancer center.  Wear comfortable clothing and clothing appropriate for easy access to any Portacath or PICC line.   We strive to give you quality time with your provider. You may need to reschedule your appointment if you arrive late (15 or more minutes).  Arriving late affects you and other patients whose appointments are after yours.  Also, if you miss three or more appointments without notifying the office, you may be dismissed from the clinic at the provider's discretion.      For prescription refill requests, have your pharmacy contact our office and allow 72 hours for refills to be completed.    Today you received the following pump d/c return as scheduled.   To help prevent nausea and vomiting after your treatment, we encourage you to take your nausea medication as directed.  BELOW ARE SYMPTOMS THAT SHOULD BE REPORTED IMMEDIATELY: *FEVER GREATER THAN 100.4 F (38 C) OR HIGHER *CHILLS OR SWEATING *NAUSEA AND VOMITING THAT IS NOT CONTROLLED WITH YOUR NAUSEA MEDICATION *UNUSUAL SHORTNESS OF BREATH *UNUSUAL BRUISING OR BLEEDING *URINARY PROBLEMS (pain or burning when urinating, or frequent urination) *BOWEL PROBLEMS (unusual diarrhea, constipation, pain near the anus) TENDERNESS IN MOUTH AND THROAT WITH OR WITHOUT PRESENCE OF ULCERS (sore throat, sores in mouth, or a toothache) UNUSUAL RASH, SWELLING OR PAIN  UNUSUAL VAGINAL DISCHARGE OR ITCHING   Items with * indicate a potential emergency and should be followed up as soon as possible or go to the Emergency Department if  any problems should occur.  Please show the CHEMOTHERAPY ALERT CARD or IMMUNOTHERAPY ALERT CARD at check-in to the Emergency Department and triage nurse.  Should you have questions after your visit or need to cancel or reschedule your appointment, please contact [facility] [phone]  and follow the prompts.  Office hours are 8:00 a.m. to 4:30 p.m. Monday - Friday. Please note that voicemails left after 4:00 p.m. may not be returned until the following business day.  We are closed weekends and major holidays. You have access to a nurse at all times for urgent questions. Please call the main number to the clinic [phone] and follow the prompts.  For any non-urgent questions, you may also contact your provider using MyChart. We now offer e-Visits for anyone 18 and older to request care online for non-urgent symptoms. For details visit [email or web address].   Also download the MyChart app! Go to the app store, search "MyChart", open the app, select [place], and log in with your MyChart username and password.   

## 2022-07-13 NOTE — Progress Notes (Signed)
Patient presents today for pump d/c.  Port flushed with good blood return noted. No bruising or swelling at site. Bandaid applied and patient discharged in satisfactory condition. VVS stable with no signs or symptoms of distressed noted.  

## 2022-07-17 ENCOUNTER — Encounter (HOSPITAL_COMMUNITY): Payer: Self-pay | Admitting: *Deleted

## 2022-07-17 ENCOUNTER — Other Ambulatory Visit: Payer: Self-pay

## 2022-07-17 ENCOUNTER — Emergency Department (HOSPITAL_COMMUNITY)
Admission: EM | Admit: 2022-07-17 | Discharge: 2022-07-17 | Disposition: A | Discharge status: Home / Self Care | Payer: Medicaid Other | Attending: Emergency Medicine | Admitting: Emergency Medicine

## 2022-07-17 DIAGNOSIS — J45909 Unspecified asthma, uncomplicated: Secondary | ICD-10-CM | POA: Diagnosis not present

## 2022-07-17 DIAGNOSIS — K9401 Colostomy hemorrhage: Secondary | ICD-10-CM | POA: Insufficient documentation

## 2022-07-17 LAB — CBC WITH DIFFERENTIAL/PLATELET
Abs Immature Granulocytes: 0.02 10*3/uL (ref 0.00–0.07)
Basophils Absolute: 0 10*3/uL (ref 0.0–0.1)
Basophils Relative: 1 %
Eosinophils Absolute: 0 10*3/uL (ref 0.0–0.5)
Eosinophils Relative: 0 %
HCT: 34.9 % — ABNORMAL LOW (ref 36.0–46.0)
Hemoglobin: 11.7 g/dL — ABNORMAL LOW (ref 12.0–15.0)
Immature Granulocytes: 1 %
Lymphocytes Relative: 26 %
Lymphs Abs: 1.1 10*3/uL (ref 0.7–4.0)
MCH: 28.6 pg (ref 26.0–34.0)
MCHC: 33.5 g/dL (ref 30.0–36.0)
MCV: 85.3 fL (ref 80.0–100.0)
Monocytes Absolute: 0.1 10*3/uL (ref 0.1–1.0)
Monocytes Relative: 3 %
Neutro Abs: 2.9 10*3/uL (ref 1.7–7.7)
Neutrophils Relative %: 69 %
Platelets: 234 10*3/uL (ref 150–400)
RBC: 4.09 MIL/uL (ref 3.87–5.11)
RDW: 14.4 % (ref 11.5–15.5)
WBC: 4.1 10*3/uL (ref 4.0–10.5)
nRBC: 0 % (ref 0.0–0.2)

## 2022-07-17 LAB — PROTIME-INR
INR: 1 (ref 0.8–1.2)
Prothrombin Time: 13.6 seconds (ref 11.4–15.2)

## 2022-07-17 LAB — COMPREHENSIVE METABOLIC PANEL
ALT: 21 U/L (ref 0–44)
AST: 33 U/L (ref 15–41)
Albumin: 3.5 g/dL (ref 3.5–5.0)
Alkaline Phosphatase: 60 U/L (ref 38–126)
Anion gap: 7 (ref 5–15)
BUN: 9 mg/dL (ref 6–20)
CO2: 23 mmol/L (ref 22–32)
Calcium: 8.5 mg/dL — ABNORMAL LOW (ref 8.9–10.3)
Chloride: 105 mmol/L (ref 98–111)
Creatinine, Ser: 0.74 mg/dL (ref 0.44–1.00)
GFR, Estimated: >60 mL/min (ref >60)
Glucose, Bld: 107 mg/dL — ABNORMAL HIGH (ref 70–99)
Potassium: 3.3 mmol/L — ABNORMAL LOW (ref 3.5–5.1)
Sodium: 135 mmol/L (ref 135–145)
Total Bilirubin: 0.7 mg/dL (ref 0.3–1.2)
Total Protein: 6.8 g/dL (ref 6.5–8.1)

## 2022-07-17 NOTE — Discharge Instructions (Signed)
Pleasure taking care of you today.  You were evaluated for some bleeding from your colostomy.  Your blood work was reassuring.  Your blood count is stable, potassium was slightly low at 3.3.  We are giving you medicine to help that,.  I discussed your case with the surgeon on-call Dr. Lovell Sheehan.  He suggested calling Dr. Queen Blossom office on tomorrow morning since he did not scope for close follow-up and he could also follow-up within a week or 2.  He states that this resolve on its own but would likely take 2 to 3 days.  If you have heavy bleeding, dizziness, abdominal pain, vomiting or any other new or worsening symptoms come back to the ER right away, otherwise follow-up as directed.  I would suggest following soft diet for the next couple days as well.

## 2022-07-17 NOTE — ED Provider Notes (Signed)
[facility] EMERGENCY DEPARTMENT AT [facility] Provider Note   CSN: [id number] Arrival date & time: 07/17/22  1042     History  No chief complaint on file.   Diana Fox is a 52 y.o. female.  History of stage IV colon adenocarcinoma, Chemotherapy with Folfiri and Vectibix.  Presents the ER today for bleeding from her stoma.  She states she was cleaning today to replace a bag for going to church and noticed some bleeding from around the ostomy, she placed a bag on has had some blood collecting in the flank.  She is not on blood thinners.  No abdominal pain, nausea or vomiting.  HPI     Home Medications Prior to Admission medications   Medication Sig Start Date End Date Taking? Authorizing Provider  amoxicillin-clavulanate (AUGMENTIN) 875-125 MG tablet Take 1 tablet by mouth 2 (two) times daily. 02/23/22   [provider]  doxycycline (VIBRA-TABS) 100 MG tablet Take 1 tablet (100 mg total) by mouth 2 (two) times daily. 06/22/22   [name], MD  ferrous sulfate 325 (65 FE) MG tablet Take 1 tablet (325 mg total) by mouth daily with breakfast. TAKE 1 tablet BY MOUTH DAILY. 06/07/22   [name], MD  fexofenadine (ALLEGRA) 180 MG tablet Take 1 tablet (180 mg total) by mouth daily. 11/16/20   Wallis Bamberg, PA-C  fluorouracil CALGB 21308 2,400 mg/m2 in sodium chloride 0.9 % 150 mL Inject 2,400 mg/m2 into the vein over 48 hr. Every 14 days 05/17/21   [provider]  FLUOROURACIL IV Inject into the vein every 14 (fourteen) days. 05/17/21   [provider]  fluticasone (FLONASE) 50 MCG/ACT nasal spray Place 2 sprays into both nostrils daily. 11/16/20   Wallis Bamberg, PA-C  hydrocortisone 1 % lotion Apply 1 Application topically 2 (two) times daily. 03/24/22   [name], MD  KLOR-CON M20 20 MEQ tablet TAKE 1 TABLET (20 MEQ TOTAL) BY MOUTH IN THE MORNING, AT NOON, IN THE EVENING, AND AT BEDTIME. 05/31/22   [name], MD   lidocaine (XYLOCAINE) 2 % solution Use as directed 10 mLs in the mouth or throat 4 (four) times daily as needed for mouth pain (Mix 1:1 with carafate and swish and spit). 04/14/22   [name], MD  magnesium oxide (MAG-OX) 400 (240 Mg) MG tablet Take 1 tablet (400 mg total) by mouth 2 (two) times daily. 06/01/21   [name], MD  medroxyPROGESTERone (DEPO-PROVERA) 150 MG/ML injection Inject 150 mg into the muscle every 3 (three) months. 03/25/21   [provider]  metaxalone (SKELAXIN) 800 MG tablet Take 800 mg by mouth 3 (three) times daily as needed. 07/05/22   [provider]  metroNIDAZOLE (METROGEL) 0.75 % vaginal gel Place vaginally at bedtime. 09/30/21   [provider]  Multiple Vitamin (MULTIVITAMIN) tablet Take 1 tablet by mouth daily.    [provider]  naproxen (NAPROSYN) 500 MG tablet Take 500 mg by mouth 2 (two) times daily. 07/05/22   [provider]  nebivolol (BYSTOLIC) 10 MG tablet Take 1 tablet (10 mg total) by mouth daily. 08/10/15   Ivery Quale, PA-C  OXALIPLATIN IV Inject into the vein every 14 (fourteen) days. 05/17/21   [provider]  oxyCODONE (OXY IR/ROXICODONE) 5 MG immediate release tablet Take by mouth. 06/01/22   [provider]  Panitumumab (VECTIBIX IV) Inject into the vein every 14 (fourteen) days. 05/17/21   [provider]  pantoprazole (PROTONIX) 40 MG tablet Take 40  mg by mouth daily as needed (acid reflux). 02/23/21   [provider]  sucralfate (CARAFATE) 1 GM/10ML suspension Take 10 mLs (1 g total) by mouth 4 (four) times daily as needed. Mix 1:1 with 2% lidocaine viscous solution and swish and spit 04/14/22   [name], MD      Allergies    Zithromax [azithromycin], Motrin [ibuprofen], Sudafed [pseudoephedrine hcl], and Zestril [lisinopril]    Review of Systems   Review of Systems  Physical Exam Updated Vital Signs BP (!) 140/80 (BP Location: Right  Arm)   Pulse 100   Temp 98.4 F (36.9 C) (Oral)   Resp 18   SpO2 99%  Physical Exam Vitals and nursing note reviewed.  Constitutional:      General: She is not in acute distress.    Appearance: She is well-developed.  HENT:     Head: Normocephalic and atraumatic.     Mouth/Throat:     Mouth: Mucous membranes are moist.  Eyes:     Conjunctiva/sclera: Conjunctivae normal.  Cardiovascular:     Rate and Rhythm: Normal rate and regular rhythm.     Heart sounds: No murmur heard. Pulmonary:     Effort: Pulmonary effort is normal. No respiratory distress.     Breath sounds: Normal breath sounds.  Abdominal:     Palpations: Abdomen is soft.     Tenderness: There is no abdominal tenderness.     Comments: Stable admitted and left abdomen, the mucosa of the small bowel slightly protruding from the stoma, is very reduced but there is some bleeding from the mucosa, also small amount of oozing coming from within the stoma.  Musculoskeletal:        General: No swelling.     Cervical back: Neck supple.  Skin:    General: Skin is warm and dry.     Capillary Refill: Capillary refill takes less than 2 seconds.  Neurological:     General: No focal deficit present.     Mental Status: She is alert and oriented to person, place, and time.  Psychiatric:        Mood and Affect: Mood normal.     ED Results / Procedures / Treatments   Labs (all labs ordered are listed, but only abnormal results are displayed) Labs Reviewed  CBC WITH DIFFERENTIAL/PLATELET - Abnormal; Notable for the following components:      Result Value   Hemoglobin 11.7 (*)    HCT 34.9 (*)    All other components within normal limits  COMPREHENSIVE METABOLIC PANEL - Abnormal; Notable for the following components:   Potassium 3.3 (*)    Glucose, Bld 107 (*)    Calcium 8.5 (*)    All other components within normal limits  PROTIME-INR    EKG None  Radiology No results found.  Procedures Procedures     Medications Ordered in ED Medications - No data to display  ED Course/ Medical Decision Making/ A&P                             Medical Decision Making This patient presents to the ED for concern of cough, wheezing, arthralgias, this involves an extensive number of treatment options, and is a complaint that carries with it a high risk of complications and morbidity.  The differential diagnosis includes pneumonia, asthma exacerbation, bronchitis, URI, sinusitis, RA exacerbation, osteoarthritis, other   Co morbidities that complicate the patient evaluation :  RA, Asthma   Additional history obtained:  Additional history obtained from EMR External records from outside source obtained and reviewed including notes   Lab Tests:  I Ordered, and personally interpreted labs.  The pertinent results include: CBC, CMP and INR are all reassuring, hemoglobin is stable 11.7, similar to 3.3.  Patient states she is going to take her home potassium which she did not take today did not want replacement here     Consultations Obtained:  I requested consultation with the neurosurgeon, Dr. Lovell Sheehan,  and discussed lab and imaging findings as well as pertinent plan - they recommend: Since patient is hemodynamically stable, has stable hemoglobin and bleeding is minimal, can see if this resolves on own and follow-up with him and GI.  Patient was scoped previously by Dr. Marletta Lor   Problem List / ED Course / Critical interventions / Medication management  ED course: Patient comes from bleeding from stoma, unsure if this is just some mucosal bleeding at the surface or possibly some distal bleeding, bleeding is minimal, stable labs and vitals.  She has no pain, had a scope of her stoma and August that was normal.  She is on active chemotherapy.  She is not on blood thinners.  Discussed with general surgery as above and plan to discharge patient home have her watch her closely, should resolve on its own,  instructed to come back for any worsening symptoms otherwise follow-up with surgery and GI.  I have reviewed the patients home medicines and have made adjustments as needed     Amount and/or Complexity of Data Reviewed External Data Reviewed: labs, radiology and notes.    Details: Oncology notes reveal patient had scope of her rectal stump and stoma August 2023 that were normal Labs: ordered.           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

## 2022-07-17 NOTE — ED Triage Notes (Addendum)
Pt states she cleaning the colostomy this morning and noted some bleeding. Denies any pain.

## 2022-07-19 ENCOUNTER — Encounter: Payer: Self-pay | Admitting: General Surgery

## 2022-07-19 ENCOUNTER — Ambulatory Visit: Payer: Medicaid Other | Admitting: Gastroenterology

## 2022-07-19 ENCOUNTER — Ambulatory Visit (INDEPENDENT_AMBULATORY_CARE_PROVIDER_SITE_OTHER): Payer: Medicaid Other | Admitting: General Surgery

## 2022-07-19 VITALS — BP 127/81 | HR 78 | Temp 98.5°F | Resp 14 | Ht 65.0 in | Wt 176.0 lb

## 2022-07-19 DIAGNOSIS — Z933 Colostomy status: Secondary | ICD-10-CM | POA: Diagnosis not present

## 2022-07-19 NOTE — Progress Notes (Deleted)
GI Office Note    Referring Provider: [name], MD Primary Care Physician:  [name], MD Primary Gastroenterologist: ***  Date:  07/19/2022  ID:  Diana Fox, DOB August 01, 1970, MRN [id number]   Chief Complaint   No chief complaint on file.    History of Present Illness  Diana Fox is a 52 y.o. female with a history of *** presenting today for ED follow up given bleeding from ostomy.   Office 02/04/2021.  She presented for hospital follow-up for colitis and IDA.  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 She had elevated inflammatory markers and evidence of IDA. She had a couple days of diarrhea prior to admission and during hospitalization negative for infectious etiology.  She was given empiric antibiotics and discharged on Augmentin with need to pursue outpatient endoscopy.  There was suspicion for underlying Crohn's disease but had no evidence of overt GI bleeding to explain her IDA. She was scheduled for colonoscopy and EGD with propofol, and advised to begin ferrous sulfate 325 mg daily.   Last colonoscopy and EGD 03/02/21. Colonoscopy with nonbleeding internal hemorrhoids, stricture in the sigmoid colon s/p biopsy and tattooing, referred to general surgery for likely resection of sigmoid colon, will need colonoscopy after resection.  EGD with gastritis s/p biopsy, normal duodenum, normal esophagus.  Stomach biopsy consistent with mild chronic inflammation and changes consistent with reactive gastropathy, no H. pylori. Sigmoid biopsy revealing adenocarcinoma, moderate lead to poorly differentiated.    Patient underwent partial colectomy with small bowel resection, and ostomy placement in February. She has also had a Port-A-Cath placed in March 2023. Receiving chemotherapy (FOLFOXIRI + Panitumumab today).    Last labs 06/29/21 - Hgb  11.8, K 2.8, Cr 0.75, AST 30, ALT 70.   Colonoscopy at [facility] by Dr. Flonnie Hailstone 03/24/22: -***  ED visit at [facility] 07/17/22 for bleeding from ostomy. *** Labs 07/17/22: ***  Today:   Current Outpatient Medications  Medication Sig Dispense Refill   amoxicillin-clavulanate (AUGMENTIN) 875-125 MG tablet Take 1 tablet by mouth 2 (two) times daily.     doxycycline (VIBRA-TABS) 100 MG tablet Take 1 tablet (100 mg total) by mouth 2 (two) times daily. 90 tablet 6   ferrous sulfate 325 (65 FE) MG tablet Take 1 tablet (325 mg total) by mouth daily with breakfast. TAKE 1 tablet BY MOUTH DAILY. 90 tablet 1   fexofenadine (ALLEGRA) 180 MG tablet Take 1 tablet (180 mg total) by mouth daily. 30 tablet 0   fluorouracil CALGB 40981 2,400 mg/m2 in sodium chloride 0.9 % 150 mL Inject 2,400 mg/m2 into the vein over 48 hr. Every 14 days     FLUOROURACIL IV Inject into the vein every 14 (fourteen) days.     fluticasone (FLONASE) 50 MCG/ACT nasal spray Place 2 sprays into both nostrils daily. 16 g 12   hydrocortisone 1 % lotion Apply 1 Application topically 2 (two) times daily. 118 mL 5   KLOR-CON M20 20 MEQ tablet TAKE 1 TABLET (20 MEQ TOTAL) BY MOUTH IN THE MORNING, AT NOON, IN THE EVENING, AND AT BEDTIME. 360 tablet 1   lidocaine (XYLOCAINE) 2 % solution Use as directed 10 mLs in the mouth or throat 4 (four) times daily as needed for mouth pain (Mix 1:1 with carafate and swish and spit). 100 mL 3   magnesium oxide (MAG-OX) 400 (240 Mg) MG tablet Take 1 tablet (400  mg total) by mouth 2 (two) times daily. 60 tablet 6   medroxyPROGESTERone (DEPO-PROVERA) 150 MG/ML injection Inject 150 mg into the muscle every 3 (three) months.     metaxalone (SKELAXIN) 800 MG tablet Take 800 mg by mouth 3 (three) times daily as needed.     metroNIDAZOLE (METROGEL) 0.75 % vaginal gel Place vaginally at bedtime.     Multiple Vitamin (MULTIVITAMIN) tablet Take 1 tablet by mouth daily.     naproxen (NAPROSYN) 500 MG tablet Take 500 mg by mouth 2  (two) times daily.     nebivolol (BYSTOLIC) 10 MG tablet Take 1 tablet (10 mg total) by mouth daily. 30 tablet 1   OXALIPLATIN IV Inject into the vein every 14 (fourteen) days.     oxyCODONE (OXY IR/ROXICODONE) 5 MG immediate release tablet Take by mouth.     Panitumumab (VECTIBIX IV) Inject into the vein every 14 (fourteen) days.     pantoprazole (PROTONIX) 40 MG tablet Take 40 mg by mouth daily as needed (acid reflux).     sucralfate (CARAFATE) 1 GM/10ML suspension Take 10 mLs (1 g total) by mouth 4 (four) times daily as needed. Mix 1:1 with 2% lidocaine viscous solution and swish and spit 420 mL 6   No current facility-administered medications for this visit.    Past Medical History:  Diagnosis Date   Anemia    Back pain    Hypertension    Port-A-Cath in place 05/10/2021    Past Surgical History:  Procedure Laterality Date   BIOPSY  03/02/2021   Procedure: BIOPSY;  Surgeon: Lanelle Bal, DO;  [facility];  Service: Endoscopy;;   BOWEL RESECTION N/A 03/26/2021   Procedure: SMALL BOWEL RESECTION;  Surgeon: Franky Macho, MD;  Location: [facility];  Service: General;  Laterality: N/A;   CHOLECYSTECTOMY     COLONOSCOPY WITH PROPOFOL N/A 10/04/2021   Procedure: COLONOSCOPY WITH PROPOFOL;  Surgeon: Lanelle Bal, DO;  Location: [facility];  Service: Endoscopy;  Laterality: N/A;  12:15PM, VIA OSTOMY   COLOSTOMY N/A 03/26/2021   Procedure: COLOSTOMY;  Surgeon: Franky Macho, MD;  Location: [facility];  Service: General;  Laterality: N/A;   ESOPHAGOGASTRODUODENOSCOPY (EGD) WITH PROPOFOL N/A 03/02/2021   Procedure: ESOPHAGOGASTRODUODENOSCOPY (EGD) WITH PROPOFOL;  Surgeon: Lanelle Bal, DO;  [facility]: [facility];  Service: Endoscopy;  Laterality: N/A;   PARTIAL COLECTOMY N/A 03/26/2021   Procedure: PARTIAL COLECTOMY;  Surgeon: Franky Macho, MD;  Location: [facility];  Service: General;  Laterality: N/A;   PORTACATH PLACEMENT Left 04/14/2021   Procedure: INSERTION PORT-A-CATH;   Surgeon: Franky Macho, MD;  Location: [facility];  Service: General;  Laterality: Left;   SCLEROTHERAPY  03/02/2021   Procedure: Susa Day;  Surgeon: Lanelle Bal, DO;  [facility];  Service: Endoscopy;;   SIGMOIDOSCOPY  03/02/2021   Procedure: Daiva Huge;  Surgeon: Lanelle Bal, DO;  [facility];  Service: Endoscopy;;    Family History  Problem Relation Age of Onset   Hypertension Mother    Hypertension Father    Colon cancer Neg Hx    Colon polyps Neg Hx    Inflammatory bowel disease Neg Hx     Allergies as of 07/19/2022 - Review Complete 07/17/2022  Allergen Reaction Noted   Zithromax [azithromycin] Hives 01/12/2021   Motrin [ibuprofen] Itching 10/14/2014   Sudafed [pseudoephedrine hcl] Other (See Comments) 02/04/2015   Zestril [lisinopril] Cough 09/02/2013    Social History   Socioeconomic History  Marital status: Married    Spouse name: Not on file   Number of children: Not on file   Years of education: Not on file   Highest education level: Not on file  Occupational History   Not on file  Tobacco Use   Smoking status: Never   Smokeless tobacco: Never  Vaping Use   Vaping Use: Never used  Substance and Sexual Activity   Alcohol use: No   Drug use: No   Sexual activity: Not on file  Other Topics Concern   Not on file  Social History Narrative   Not on file   Social Determinants of Health   Financial Resource Strain: High Risk (05/19/2021)   Overall Financial Resource Strain (CARDIA)    Difficulty of Paying Living Expenses: Hard  Food Insecurity: Not on file  Transportation Needs: Not on file  Physical Activity: Not on file  Stress: Not on file  Social Connections: Not on file     Review of Systems   Gen: Denies fever, chills, anorexia. Denies fatigue, weakness, weight loss.  CV: Denies chest pain, palpitations, syncope, peripheral edema, and claudication. Resp: Denies dyspnea at rest, cough, wheezing, coughing up  blood, and pleurisy. GI: See HPI Derm: Denies rash, itching, dry skin Psych: Denies depression, anxiety, memory loss, confusion. No homicidal or suicidal ideation.  Heme: Denies bruising, bleeding, and enlarged lymph nodes.   Physical Exam   There were no vitals taken for this visit.  General:   Alert and oriented. No distress noted. Pleasant and cooperative.  Head:  Normocephalic and atraumatic. Eyes:  Conjuctiva clear without scleral icterus. Mouth:  Oral mucosa pink and moist. Good dentition. No lesions. Lungs:  Clear to auscultation bilaterally. No wheezes, rales, or rhonchi. No distress.  Heart:  S1, S2 present without murmurs appreciated.  Abdomen:  +BS, soft, non-tender and non-distended. No rebound or guarding. No HSM or masses noted. Rectal: *** Msk:  Symmetrical without gross deformities. Normal posture. Extremities:  Without edema. Neurologic:  Alert and  oriented x4 Psych:  Alert and cooperative. Normal mood and affect.   Assessment  Diana Fox is a 52 y.o. female with a history of *** presenting today with    PLAN   ***     Brooke Bonito, MSN, FNP-BC, AGACNP-BC [facility]

## 2022-07-19 NOTE — Progress Notes (Signed)
Subjective:     Diana Fox  Patient here for follow-up from an ER visit where she was having blood in her colostomy bag.  This occurred a few days ago.  It has since stopped.  She is actively undergoing chemotherapy.  She has been evaluated at [facility] for colostomy reversal but has been hesitant to proceed with the surgery due to issues with her fibroid tumors. Objective:    BP 127/81   Pulse 78   Temp 98.5 F (36.9 C) (Oral)   Resp 14   Ht 5\' 5"  (1.651 m)   Wt 176 lb (79.8 kg)   SpO2 96%   BMI 29.29 kg/m   General:  alert, cooperative, and no distress  Head is normocephalic, atraumatic Lungs are clear to auscultation with equal breath sounds bilaterally Heart examination reveals regular rate rhythm without S3, S4, murmurs Abdomen is soft with a well-healed surgical scar present.  I looked at her ostomy site and there was no blood present.  No granulomas were noted.  I did review her recent medical records.  She had a negative colonoscopy through the ostomy site earlier this year.     Assessment:    Status post Hartman's procedure, recent blood in her colostomy bag which has since resolved    Plan:   I told her to return to my care should she have any issues with bleeding.  This may have been related to her recent return to chemotherapy infusions.  I did tell her to strongly reconsider having her colostomy reversed at [facility] as it is too low for me to reverse.  She understands and agrees.  Follow-up as needed.

## 2022-07-20 ENCOUNTER — Other Ambulatory Visit: Payer: Self-pay

## 2022-07-20 MED ORDER — CLINDAMYCIN PHOSPHATE 1 % EX GEL: Freq: Two times a day (BID) | CUTANEOUS | 0 refills | Status: DC

## 2022-07-21 ENCOUNTER — Other Ambulatory Visit: Payer: Self-pay | Admitting: *Deleted

## 2022-07-21 MED ORDER — CLINDAMYCIN PHOS-BENZOYL PEROX 1.2-5 % EX GEL: 1.0000 | Freq: Two times a day (BID) | CUTANEOUS | 2 refills | Status: DC

## 2022-07-21 MED ORDER — CLINDAMYCIN PHOS-BENZOYL PEROX 1-5 % EX GEL: Freq: Two times a day (BID) | CUTANEOUS | 2 refills | Status: DC

## 2022-07-25 ENCOUNTER — Inpatient Hospital Stay: Payer: Medicaid Other

## 2022-07-25 ENCOUNTER — Ambulatory Visit: Payer: Medicaid Other | Admitting: Hematology

## 2022-07-25 VITALS — BP 151/88 | HR 81 | Temp 98.0°F | Resp 18

## 2022-07-25 DIAGNOSIS — C189 Malignant neoplasm of colon, unspecified: Secondary | ICD-10-CM

## 2022-07-25 DIAGNOSIS — Z95828 Presence of other vascular implants and grafts: Secondary | ICD-10-CM

## 2022-07-25 DIAGNOSIS — E876 Hypokalemia: Secondary | ICD-10-CM

## 2022-07-25 DIAGNOSIS — Z5111 Encounter for antineoplastic chemotherapy: Secondary | ICD-10-CM | POA: Diagnosis not present

## 2022-07-25 LAB — COMPREHENSIVE METABOLIC PANEL
ALT: 18 U/L (ref 0–44)
AST: 15 U/L (ref 15–41)
Albumin: 3.4 g/dL — ABNORMAL LOW (ref 3.5–5.0)
Alkaline Phosphatase: 64 U/L (ref 38–126)
Anion gap: 8 (ref 5–15)
BUN: 6 mg/dL (ref 6–20)
CO2: 23 mmol/L (ref 22–32)
Calcium: 8.2 mg/dL — ABNORMAL LOW (ref 8.9–10.3)
Chloride: 107 mmol/L (ref 98–111)
Creatinine, Ser: 0.74 mg/dL (ref 0.44–1.00)
GFR, Estimated: >60 mL/min (ref >60)
Glucose, Bld: 107 mg/dL — ABNORMAL HIGH (ref 70–99)
Potassium: 3 mmol/L — ABNORMAL LOW (ref 3.5–5.1)
Sodium: 138 mmol/L (ref 135–145)
Total Bilirubin: 0.5 mg/dL (ref 0.3–1.2)
Total Protein: 6.4 g/dL — ABNORMAL LOW (ref 6.5–8.1)

## 2022-07-25 LAB — CBC WITH DIFFERENTIAL/PLATELET
Abs Immature Granulocytes: 0.01 10*3/uL (ref 0.00–0.07)
Basophils Absolute: 0 10*3/uL (ref 0.0–0.1)
Basophils Relative: 1 %
Eosinophils Absolute: 0.2 10*3/uL (ref 0.0–0.5)
Eosinophils Relative: 5 %
HCT: 33.3 % — ABNORMAL LOW (ref 36.0–46.0)
Hemoglobin: 11.2 g/dL — ABNORMAL LOW (ref 12.0–15.0)
Immature Granulocytes: 0 %
Lymphocytes Relative: 53 %
Lymphs Abs: 2 10*3/uL (ref 0.7–4.0)
MCH: 28.7 pg (ref 26.0–34.0)
MCHC: 33.6 g/dL (ref 30.0–36.0)
MCV: 85.4 fL (ref 80.0–100.0)
Monocytes Absolute: 0.4 10*3/uL (ref 0.1–1.0)
Monocytes Relative: 10 %
Neutro Abs: 1.2 10*3/uL — ABNORMAL LOW (ref 1.7–7.7)
Neutrophils Relative %: 31 %
Platelets: 262 10*3/uL (ref 150–400)
RBC: 3.9 MIL/uL (ref 3.87–5.11)
RDW: 15.5 % (ref 11.5–15.5)
WBC: 3.7 10*3/uL — ABNORMAL LOW (ref 4.0–10.5)
nRBC: 0 % (ref 0.0–0.2)

## 2022-07-25 LAB — MAGNESIUM: Magnesium: 1.7 mg/dL (ref 1.7–2.4)

## 2022-07-25 MED ORDER — HEPARIN SOD (PORK) LOCK FLUSH 100 UNIT/ML IV SOLN: 500.0000 [IU] | Freq: Once | INTRAVENOUS | Status: DC | PRN

## 2022-07-25 MED ORDER — SODIUM CHLORIDE 0.9 % IV SOLN
400.0000 mg | Freq: Once | INTRAVENOUS | Status: AC
  Administered 2022-07-25: 400 mg via INTRAVENOUS
  Filled 2022-07-25: qty 20

## 2022-07-25 MED ORDER — PALONOSETRON HCL INJECTION 0.25 MG/5ML
0.2500 mg | Freq: Once | INTRAVENOUS | Status: AC
  Administered 2022-07-25: 0.25 mg via INTRAVENOUS
  Filled 2022-07-25: qty 5

## 2022-07-25 MED ORDER — SODIUM CHLORIDE 0.9 % IV SOLN
165.0000 mg/m2 | Freq: Once | INTRAVENOUS | Status: AC
  Administered 2022-07-25: 300 mg via INTRAVENOUS
  Filled 2022-07-25: qty 15

## 2022-07-25 MED ORDER — SODIUM CHLORIDE 0.9 % IV SOLN
200.0000 mg/m2 | Freq: Once | INTRAVENOUS | Status: AC
  Administered 2022-07-25: 370 mg via INTRAVENOUS
  Filled 2022-07-25: qty 18.5

## 2022-07-25 MED ORDER — SODIUM CHLORIDE 0.9 % IV SOLN
2400.0000 mg/m2 | INTRAVENOUS | Status: DC
  Administered 2022-07-25: 4450 mg via INTRAVENOUS
  Filled 2022-07-25: qty 89

## 2022-07-25 MED ORDER — SODIUM CHLORIDE 0.9 % IV SOLN
10.0000 mg | Freq: Once | INTRAVENOUS | Status: AC
  Administered 2022-07-25: 10 mg via INTRAVENOUS
  Filled 2022-07-25: qty 10

## 2022-07-25 MED ORDER — SODIUM CHLORIDE 0.9% FLUSH: 10.0000 mL | INTRAVENOUS | Status: DC | PRN

## 2022-07-25 MED ORDER — FLUOROURACIL CHEMO INJECTION 2.5 GM/50ML
400.0000 mg/m2 | Freq: Once | INTRAVENOUS | Status: AC
  Administered 2022-07-25: 750 mg via INTRAVENOUS
  Filled 2022-07-25: qty 15

## 2022-07-25 MED ORDER — SODIUM CHLORIDE 0.9 % IV SOLN
150.0000 mg | Freq: Once | INTRAVENOUS | Status: AC
  Administered 2022-07-25: 150 mg via INTRAVENOUS
  Filled 2022-07-25: qty 150

## 2022-07-25 MED ORDER — SODIUM CHLORIDE 0.9 % IV SOLN: Freq: Once | INTRAVENOUS | Status: AC

## 2022-07-25 MED ORDER — ATROPINE SULFATE 1 MG/ML IV SOLN
0.5000 mg | Freq: Once | INTRAVENOUS | Status: AC
  Administered 2022-07-25: 0.5 mg via INTRAVENOUS
  Filled 2022-07-25: qty 1

## 2022-07-25 MED ORDER — SODIUM CHLORIDE 0.9% FLUSH
10.0000 mL | INTRAVENOUS | Status: DC | PRN
  Administered 2022-07-25: 10 mL via INTRAVENOUS

## 2022-07-25 MED ORDER — POTASSIUM CHLORIDE CRYS ER 20 MEQ PO TBCR
40.0000 meq | EXTENDED_RELEASE_TABLET | Freq: Once | ORAL | Status: AC
  Administered 2022-07-25: 40 meq via ORAL
  Filled 2022-07-25: qty 2

## 2022-07-25 MED ORDER — SODIUM CHLORIDE 0.9 % IV SOLN
200.0000 mg/m2 | Freq: Once | INTRAVENOUS | Status: DC
  Filled 2022-07-25: qty 18.5

## 2022-07-25 NOTE — Progress Notes (Signed)
Patients port flushed without difficulty.  Good blood return noted with no bruising or swelling noted at site.  VSS. Patient remains accessed for treatment.  

## 2022-07-25 NOTE — Progress Notes (Signed)
Give 400 mg Vectibix today due to rash.  Will assess with next dose if need to go back up.  T.O. Dr Carilyn Goodpasture, PharmD

## 2022-07-25 NOTE — Patient Instructions (Signed)
[facility]  Discharge Instructions: Thank you for choosing [facility] to provide your oncology and hematology care.  If you have a lab appointment with the Cancer Center - please note that after April 8th, 2024, all labs will be drawn in the cancer center.  You do not have to check in or register with the main entrance as you have in the past but will complete your check-in in the cancer center.  Wear comfortable clothing and clothing appropriate for easy access to any Portacath or PICC line.   We strive to give you quality time with your provider. You may need to reschedule your appointment if you arrive late (15 or more minutes).  Arriving late affects you and other patients whose appointments are after yours.  Also, if you miss three or more appointments without notifying the office, you may be dismissed from the clinic at the provider's discretion.      For prescription refill requests, have your pharmacy contact our office and allow 72 hours for refills to be completed.    Today you received the following chemotherapy and/or immunotherapy agents FOLFIRI/Vectibix      To help prevent nausea and vomiting after your treatment, we encourage you to take your nausea medication as directed.  BELOW ARE SYMPTOMS THAT SHOULD BE REPORTED IMMEDIATELY: *FEVER GREATER THAN 100.4 F (38 C) OR HIGHER *CHILLS OR SWEATING *NAUSEA AND VOMITING THAT IS NOT CONTROLLED WITH YOUR NAUSEA MEDICATION *UNUSUAL SHORTNESS OF BREATH *UNUSUAL BRUISING OR BLEEDING *URINARY PROBLEMS (pain or burning when urinating, or frequent urination) *BOWEL PROBLEMS (unusual diarrhea, constipation, pain near the anus) TENDERNESS IN MOUTH AND THROAT WITH OR WITHOUT PRESENCE OF ULCERS (sore throat, sores in mouth, or a toothache) UNUSUAL RASH, SWELLING OR PAIN  UNUSUAL VAGINAL DISCHARGE OR ITCHING   Items with * indicate a potential emergency and should be followed up as soon as possible or go  to the Emergency Department if any problems should occur.  Please show the CHEMOTHERAPY ALERT CARD or IMMUNOTHERAPY ALERT CARD at check-in to the Emergency Department and triage nurse.  Should you have questions after your visit or need to cancel or reschedule your appointment, please contact [facility] AT [facility] [phone]  and follow the prompts.  Office hours are 8:00 a.m. to 4:30 p.m. Monday - Friday. Please note that voicemails left after 4:00 p.m. may not be returned until the following business day.  We are closed weekends and major holidays. You have access to a nurse at all times for urgent questions. Please call the main number to the clinic [phone] and follow the prompts.  For any non-urgent questions, you may also contact your provider using MyChart. We now offer e-Visits for anyone 85 and older to request care online for non-urgent symptoms. For details visit [email or web address].   Also download the MyChart app! Go to the app store, search "MyChart", open the app, select , and log in with your MyChart username and password.

## 2022-07-25 NOTE — Progress Notes (Signed)
Patient presents today for Vectibix/FOLFIRI with 5FU pump start.  Vital signs within parameters for treatment.  Labs pending.  Patient has a red rash to her chest/neck/shoulders/back that started after last treatment.  Per patient a cream was called in to her pharmacy but it has no helped and it seems to be worse.  ANC noted to be 1.2, MD notifed.  Message received from Kennith Gain RN/Dr. Ellin Saba okay to treat.  Treatment given today per MD orders.  Stable during infusion without adverse affects.  Vital signs stable.  5FU pump start, verified RUN on the screen with the patient.  No complaints at this time.  Discharge from clinic ambulatory in stable condition.  Alert and oriented X 3.  Follow up with [facility] as scheduled.

## 2022-07-27 ENCOUNTER — Inpatient Hospital Stay: Payer: Medicaid Other

## 2022-07-27 VITALS — BP 145/93 | HR 79 | Temp 97.0°F | Resp 18

## 2022-07-27 DIAGNOSIS — Z95828 Presence of other vascular implants and grafts: Secondary | ICD-10-CM

## 2022-07-27 DIAGNOSIS — Z5111 Encounter for antineoplastic chemotherapy: Secondary | ICD-10-CM | POA: Diagnosis not present

## 2022-07-27 DIAGNOSIS — C189 Malignant neoplasm of colon, unspecified: Secondary | ICD-10-CM

## 2022-07-27 LAB — CEA: CEA: 34.3 ng/mL — ABNORMAL HIGH (ref 0.0–4.7)

## 2022-07-27 MED ORDER — HEPARIN SOD (PORK) LOCK FLUSH 100 UNIT/ML IV SOLN
500.0000 [IU] | Freq: Once | INTRAVENOUS | Status: AC | PRN
  Administered 2022-07-27: 500 [IU]

## 2022-07-27 MED ORDER — SODIUM CHLORIDE 0.9% FLUSH
10.0000 mL | INTRAVENOUS | Status: DC | PRN
  Administered 2022-07-27: 10 mL

## 2022-07-27 NOTE — Progress Notes (Signed)
Hortense Winegar presents to have home infusion pump d/c'd and for port-a-cath deaccess with flush.  Portacath located left chest wall accessed with  H 20 needle.  Good blood return present. Portacath flushed with NS and 500U/75ml Heparin, and needle removed intact.  Procedure tolerated well and without incident.

## 2022-07-27 NOTE — Patient Instructions (Signed)
[facility]  Discharge Instructions: Thank you for choosing [facility] to provide your oncology and hematology care.  If you have a lab appointment with the Cancer Center - please note that after April 8th, 2024, all labs will be drawn in the cancer center.  You do not have to check in or register with the main entrance as you have in the past but will complete your check-in in the cancer center.  Wear comfortable clothing and clothing appropriate for easy access to any Portacath or PICC line.   We strive to give you quality time with your provider. You may need to reschedule your appointment if you arrive late (15 or more minutes).  Arriving late affects you and other patients whose appointments are after yours.  Also, if you miss three or more appointments without notifying the office, you may be dismissed from the clinic at the provider's discretion.      For prescription refill requests, have your pharmacy contact our office and allow 72 hours for refills to be completed.    Today you had you ambulatory pump disconnected per protocol   To help prevent nausea and vomiting after your treatment, we encourage you to take your nausea medication as directed.  BELOW ARE SYMPTOMS THAT SHOULD BE REPORTED IMMEDIATELY: *FEVER GREATER THAN 100.4 F (38 C) OR HIGHER *CHILLS OR SWEATING *NAUSEA AND VOMITING THAT IS NOT CONTROLLED WITH YOUR NAUSEA MEDICATION *UNUSUAL SHORTNESS OF BREATH *UNUSUAL BRUISING OR BLEEDING *URINARY PROBLEMS (pain or burning when urinating, or frequent urination) *BOWEL PROBLEMS (unusual diarrhea, constipation, pain near the anus) TENDERNESS IN MOUTH AND THROAT WITH OR WITHOUT PRESENCE OF ULCERS (sore throat, sores in mouth, or a toothache) UNUSUAL RASH, SWELLING OR PAIN  UNUSUAL VAGINAL DISCHARGE OR ITCHING   Items with * indicate a potential emergency and should be followed up as soon as possible or go to the Emergency Department if any  problems should occur.  Please show the CHEMOTHERAPY ALERT CARD or IMMUNOTHERAPY ALERT CARD at check-in to the Emergency Department and triage nurse.  Should you have questions after your visit or need to cancel or reschedule your appointment, please contact [facility] AT [facility] [phone]  and follow the prompts.  Office hours are 8:00 a.m. to 4:30 p.m. Monday - Friday. Please note that voicemails left after 4:00 p.m. may not be returned until the following business day.  We are closed weekends and major holidays. You have access to a nurse at all times for urgent questions. Please call the main number to the clinic [phone] and follow the prompts.  For any non-urgent questions, you may also contact your provider using MyChart. We now offer e-Visits for anyone 51 and older to request care online for non-urgent symptoms. For details visit [email or web address].   Also download the MyChart app! Go to the app store, search "MyChart", open the app, select [place], and log in with your MyChart username and password.

## 2022-08-06 ENCOUNTER — Other Ambulatory Visit: Payer: Self-pay

## 2022-08-15 ENCOUNTER — Inpatient Hospital Stay: Payer: Medicaid Other | Attending: Hematology

## 2022-08-15 ENCOUNTER — Inpatient Hospital Stay (HOSPITAL_BASED_OUTPATIENT_CLINIC_OR_DEPARTMENT_OTHER): Payer: Medicaid Other | Admitting: Hematology

## 2022-08-15 ENCOUNTER — Inpatient Hospital Stay: Payer: Medicaid Other

## 2022-08-15 DIAGNOSIS — D649 Anemia, unspecified: Secondary | ICD-10-CM | POA: Diagnosis not present

## 2022-08-15 DIAGNOSIS — K648 Other hemorrhoids: Secondary | ICD-10-CM | POA: Diagnosis not present

## 2022-08-15 DIAGNOSIS — D509 Iron deficiency anemia, unspecified: Secondary | ICD-10-CM | POA: Diagnosis not present

## 2022-08-15 DIAGNOSIS — R599 Enlarged lymph nodes, unspecified: Secondary | ICD-10-CM | POA: Diagnosis not present

## 2022-08-15 DIAGNOSIS — K56699 Other intestinal obstruction unspecified as to partial versus complete obstruction: Secondary | ICD-10-CM | POA: Insufficient documentation

## 2022-08-15 DIAGNOSIS — Z79899 Other long term (current) drug therapy: Secondary | ICD-10-CM | POA: Insufficient documentation

## 2022-08-15 DIAGNOSIS — Z9221 Personal history of antineoplastic chemotherapy: Secondary | ICD-10-CM | POA: Insufficient documentation

## 2022-08-15 DIAGNOSIS — Z5111 Encounter for antineoplastic chemotherapy: Secondary | ICD-10-CM | POA: Diagnosis present

## 2022-08-15 DIAGNOSIS — Z793 Long term (current) use of hormonal contraceptives: Secondary | ICD-10-CM | POA: Insufficient documentation

## 2022-08-15 DIAGNOSIS — E876 Hypokalemia: Secondary | ICD-10-CM | POA: Insufficient documentation

## 2022-08-15 DIAGNOSIS — I10 Essential (primary) hypertension: Secondary | ICD-10-CM | POA: Insufficient documentation

## 2022-08-15 DIAGNOSIS — C189 Malignant neoplasm of colon, unspecified: Secondary | ICD-10-CM | POA: Diagnosis not present

## 2022-08-15 DIAGNOSIS — C187 Malignant neoplasm of sigmoid colon: Secondary | ICD-10-CM | POA: Diagnosis not present

## 2022-08-15 DIAGNOSIS — Z95828 Presence of other vascular implants and grafts: Secondary | ICD-10-CM

## 2022-08-15 DIAGNOSIS — M549 Dorsalgia, unspecified: Secondary | ICD-10-CM | POA: Insufficient documentation

## 2022-08-15 DIAGNOSIS — R21 Rash and other nonspecific skin eruption: Secondary | ICD-10-CM | POA: Diagnosis not present

## 2022-08-15 DIAGNOSIS — G629 Polyneuropathy, unspecified: Secondary | ICD-10-CM | POA: Insufficient documentation

## 2022-08-15 DIAGNOSIS — Z791 Long term (current) use of non-steroidal anti-inflammatories (NSAID): Secondary | ICD-10-CM | POA: Insufficient documentation

## 2022-08-15 LAB — CBC WITH DIFFERENTIAL/PLATELET
Abs Immature Granulocytes: 0.12 10*3/uL — ABNORMAL HIGH (ref 0.00–0.07)
Basophils Absolute: 0.1 10*3/uL (ref 0.0–0.1)
Basophils Relative: 1 %
Eosinophils Absolute: 0.2 10*3/uL (ref 0.0–0.5)
Eosinophils Relative: 2 %
HCT: 34.9 % — ABNORMAL LOW (ref 36.0–46.0)
Hemoglobin: 11.5 g/dL — ABNORMAL LOW (ref 12.0–15.0)
Immature Granulocytes: 1 %
Lymphocytes Relative: 35 %
Lymphs Abs: 2.9 10*3/uL (ref 0.7–4.0)
MCH: 28.8 pg (ref 26.0–34.0)
MCHC: 33 g/dL (ref 30.0–36.0)
MCV: 87.3 fL (ref 80.0–100.0)
Monocytes Absolute: 0.8 10*3/uL (ref 0.1–1.0)
Monocytes Relative: 10 %
Neutro Abs: 4.3 10*3/uL (ref 1.7–7.7)
Neutrophils Relative %: 51 %
Platelets: 323 10*3/uL (ref 150–400)
RBC: 4 MIL/uL (ref 3.87–5.11)
RDW: 18.4 % — ABNORMAL HIGH (ref 11.5–15.5)
WBC: 8.4 10*3/uL (ref 4.0–10.5)
nRBC: 0 % (ref 0.0–0.2)

## 2022-08-15 LAB — COMPREHENSIVE METABOLIC PANEL
ALT: 16 U/L (ref 0–44)
AST: 16 U/L (ref 15–41)
Albumin: 3.4 g/dL — ABNORMAL LOW (ref 3.5–5.0)
Alkaline Phosphatase: 54 U/L (ref 38–126)
Anion gap: 7 (ref 5–15)
BUN: 8 mg/dL (ref 6–20)
CO2: 21 mmol/L — ABNORMAL LOW (ref 22–32)
Calcium: 8.4 mg/dL — ABNORMAL LOW (ref 8.9–10.3)
Chloride: 109 mmol/L (ref 98–111)
Creatinine, Ser: 0.74 mg/dL (ref 0.44–1.00)
GFR, Estimated: >60 mL/min (ref >60)
Glucose, Bld: 112 mg/dL — ABNORMAL HIGH (ref 70–99)
Potassium: 3.5 mmol/L (ref 3.5–5.1)
Sodium: 137 mmol/L (ref 135–145)
Total Bilirubin: 0.6 mg/dL (ref 0.3–1.2)
Total Protein: 6.8 g/dL (ref 6.5–8.1)

## 2022-08-15 LAB — MAGNESIUM: Magnesium: 1.8 mg/dL (ref 1.7–2.4)

## 2022-08-15 MED ORDER — SODIUM CHLORIDE 0.9 % IV SOLN
10.0000 mg | Freq: Once | INTRAVENOUS | Status: AC
  Administered 2022-08-15: 10 mg via INTRAVENOUS
  Filled 2022-08-15: qty 10

## 2022-08-15 MED ORDER — SODIUM CHLORIDE 0.9 % IV SOLN
500.0000 mg | Freq: Once | INTRAVENOUS | Status: AC
  Administered 2022-08-15: 500 mg via INTRAVENOUS
  Filled 2022-08-15: qty 20

## 2022-08-15 MED ORDER — SODIUM CHLORIDE 0.9 % IV SOLN
150.0000 mg | Freq: Once | INTRAVENOUS | Status: AC
  Administered 2022-08-15: 150 mg via INTRAVENOUS
  Filled 2022-08-15: qty 150

## 2022-08-15 MED ORDER — PALONOSETRON HCL INJECTION 0.25 MG/5ML
0.2500 mg | Freq: Once | INTRAVENOUS | Status: AC
  Administered 2022-08-15: 0.25 mg via INTRAVENOUS
  Filled 2022-08-15: qty 5

## 2022-08-15 MED ORDER — SODIUM CHLORIDE 0.9 % IV SOLN
200.0000 mg/m2 | Freq: Once | INTRAVENOUS | Status: AC
  Administered 2022-08-15: 370 mg via INTRAVENOUS
  Filled 2022-08-15: qty 18.5

## 2022-08-15 MED ORDER — SODIUM CHLORIDE 0.9 % IV SOLN
165.0000 mg/m2 | Freq: Once | INTRAVENOUS | Status: AC
  Administered 2022-08-15: 300 mg via INTRAVENOUS
  Filled 2022-08-15: qty 15

## 2022-08-15 MED ORDER — SODIUM CHLORIDE 0.9 % IV SOLN: Freq: Once | INTRAVENOUS | Status: AC

## 2022-08-15 MED ORDER — SODIUM CHLORIDE 0.9 % IV SOLN
2400.0000 mg/m2 | INTRAVENOUS | Status: DC
  Administered 2022-08-15: 4450 mg via INTRAVENOUS
  Filled 2022-08-15: qty 89

## 2022-08-15 MED ORDER — ATROPINE SULFATE 1 MG/ML IV SOLN
0.5000 mg | Freq: Once | INTRAVENOUS | Status: AC
  Administered 2022-08-15: 0.5 mg via INTRAVENOUS
  Filled 2022-08-15: qty 1

## 2022-08-15 MED ORDER — FLUOROURACIL CHEMO INJECTION 2.5 GM/50ML
400.0000 mg/m2 | Freq: Once | INTRAVENOUS | Status: AC
  Administered 2022-08-15: 750 mg via INTRAVENOUS
  Filled 2022-08-15: qty 15

## 2022-08-15 NOTE — Progress Notes (Signed)
Give Vectibix full dose 6 mg/kg = 500 mg for today.  V.O. Dr Carilyn Goodpasture, PharmD

## 2022-08-15 NOTE — Patient Instructions (Signed)
[facility] at [facility] Discharge Instructions   You were seen and examined today by Dr. Katragadda.  He reviewed the results of your lab work which are normal/stable.   We will proceed with your treatment today.  Return as scheduled.    Thank you for choosing [facility] at [facility] to provide your oncology and hematology care.  To afford each patient quality time with our provider, please arrive at least 15 minutes before your scheduled appointment time.   If you have a lab appointment with the Cancer Center please come in thru the Main Entrance and check in at the main information desk.  You need to re-schedule your appointment should you arrive 10 or more minutes late.  We strive to give you quality time with our providers, and arriving late affects you and other patients whose appointments are after yours.  Also, if you no show three or more times for appointments you may be dismissed from the clinic at the providers discretion.     Again, thank you for choosing [facility].  Our hope is that these requests will decrease the amount of time that you wait before being seen by our physicians.       _____________________________________________________________  Should you have questions after your visit to [facility], please contact our office at [phone] and follow the prompts.  Our office hours are 8:00 a.m. and 4:30 p.m. Monday - Friday.  Please note that voicemails left after 4:00 p.m. may not be returned until the following business day.  We are closed weekends and major holidays.  You do have access to a nurse 24-7, just call the main number to the clinic [phone] and do not press any options, hold on the line and a nurse will answer the phone.    For prescription refill requests, have your pharmacy contact our office and allow 72 hours.    Due to Covid, you will need to wear a mask upon entering  the hospital. If you do not have a mask, a mask will be given to you at the Main Entrance upon arrival. For doctor visits, patients may have 1 support person age 18 or older with them. For treatment visits, patients can not have anyone with them due to social distancing guidelines and our immunocompromised population.      

## 2022-08-15 NOTE — Progress Notes (Signed)
[facility] [address] [place] [address]    Clinic Day:  08/15/2022  Referring physician: [name], MD  Patient Care Team: [name], MD as PCP - General (Family Medicine) [name], MD as Medical Oncologist (Medical Oncology) [name], RN as Oncology Nurse Navigator (Medical Oncology)   ASSESSMENT & PLAN:   Assessment: 1. 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  Biopsy consistent with moderately to poorly differentiated adenocarcinoma. - CTAP with contrast on 01/10/2021: Diffusely thickened, 10 cm segment of distal sigmoid colon with adjacent moderate inflammatory reaction, suspected 2 sites of small bowel fistulization to the diseased segment and a rim-enhancing low-density 2.5 x 1.8 cm stricture medial to the mid segment which could represent a necrotic lymph node or contained perforation/abscess.  There are bilateral mildly enlarged common iliac chain nodes.  Mildly dilated mid to lower abdominal small bowel segments.  Mildly prominent slightly steatotic liver. - Sigmoid colon resection, partial small bowel resection and Hartman's procedure on 03/26/2021: Moderately to poorly differentiated adenocarcinoma involving the subserosal adipose tissue and present at the margin.  9/9 lymph nodes positive for metastatic carcinoma.  Small bowel resection is positive for adenocarcinoma nodules in the serosal fat.  PT4PN2B.  MMR is preserved. - Operative report mentions obvious extension of the tumor outside the colon to the pelvic brim, unable to fully excise all of the tumor in the pelvis. - NGS testing: KRAS/NRAS negative, BRAF negative, HER2 negative, MSI-stable - PET scan on 04/29/2021: Multifocal hypermetabolic nodule, peritoneal and  anterior abdominal wall adenopathy.  Findings worrisome for peritoneal carcinomatosis.  No evidence of hepatic metastatic disease or distant mets. - FOLFOXIRI and Vectibix started on 05/17/2021 - We will see how she responds and will refer to Dr. Flonnie Hailstone for resection/HIPEC therapy.  Colonoscopy through stoma and rectal stump on 10/04/2021 was negative.   2. Social/family history: - She lives at home with her family.  She works as a Lawyer at [employer].  Non-smoker. - Paternal cousin had cancer.  Maternal aunt had cancer.    Plan: 1. Stage IV sigmoid colon adenocarcinoma, MSI-stable, RAS/BRAF negative: - CT CAP (05/10/2022): Done at [facility] showed interval enlargement of bilateral ovaries with lobulated appearance concerning for metastatic disease. - She has received FOLFIRI and Vectibix on 06/13/2022, 07/11/2022 and 07/25/2022. - Labs today: Normal LFTs and creatinine.  CBC grossly normal.  CEA has improved to 34.3 from 68.9. - Continue treatment today and every 2 weeks.  RTC 6 weeks with CT CAP and CEA level.  2.  Hypokalemia: - Continue potassium 20 mEq twice daily.  Potassium 3.5.  3.  Peripheral neuropathy: - Numbness in the feet is stable with complete improvement in fingertips.  4.  Microcytic anemia: - Continue iron tablet daily.  5.  EGFR antibody induced rash: - Continue doxycycline 100 mg twice daily as started on 06/22/2022.  Continue hydrocortisone ointment 1% twice daily.  Rash is well-controlled.  Will consider prescribing clindamycin gel if the rash comes back.    Orders Placed This Encounter  Procedures   CT CHEST ABDOMEN PELVIS W CONTRAST    Standing Status:   Future    Standing Expiration Date:   08/15/2023    Order Specific Question:   If indicated for the ordered procedure, I authorize  the administration of contrast media per Radiology protocol    Answer:   Yes    Order Specific Question:   Does the patient have a contrast media/X-ray dye allergy?     Answer:   No    Order Specific Question:   Is patient pregnant?    Answer:   No    Order Specific Question:   Preferred imaging location?    Answer:   [facility]    Order Specific Question:   If indicated for the ordered procedure, I authorize the administration of oral contrast media per Radiology protocol    Answer:   Yes   Magnesium    Standing Status:   Future    Standing Expiration Date:   10/09/2023   CBC with Differential    Standing Status:   Future    Standing Expiration Date:   10/10/2023   Comprehensive metabolic panel    Standing Status:   Future    Standing Expiration Date:   10/10/2023   Magnesium    Standing Status:   Future    Standing Expiration Date:   10/23/2023   CBC with Differential    Standing Status:   Future    Standing Expiration Date:   10/24/2023   Comprehensive metabolic panel    Standing Status:   Future    Standing Expiration Date:   10/24/2023      [name],acting as a scribe for [name], MD.,have documented all relevant documentation on the behalf of [name], MD,as directed by  [name], MD while in the presence of [name], MD.  I, [name] MD, have reviewed the above documentation for accuracy and completeness, and I agree with the above.    [name], MD   7/8/20245:43 PM  CHIEF COMPLAINT:   Diagnosis: stage IV sigmoid colon adenocarcinoma    Cancer Staging  Colon carcinoma [facility]) Staging form: Colon and Rectum, AJCC 8th Edition - Clinical stage from 04/06/2021: Stage IVC (cT4a, cN2b, pM1c) - Signed by [name], MD on 05/04/2021    Prior Therapy: Sigmoid colon resection, partial small bowel resection and Hartman's procedure on 03/26/2021   Current Therapy:  FOLFOXIRI + Panitumumab q14d    HISTORY OF PRESENT ILLNESS:   Oncology History  Colon carcinoma (HCC)  03/26/2021 Initial Diagnosis   Colon carcinoma (HCC)   04/06/2021 Cancer Staging    Staging form: Colon and Rectum, AJCC 8th Edition - Clinical stage from 04/06/2021: Stage IVC (cT4a, cN2b, pM1c) - Signed by [name], MD on 05/04/2021 Histopathologic type: Adenocarcinoma, NOS Stage prefix: Initial diagnosis Total positive nodes: 9 Total nodes examined: 9 Tumor deposits (TD): Present Carcinoembryonic antigen (CEA) (ng/mL): 61.5 Perineural invasion (PNI): Present Microsatellite instability (MSI): Stable KRAS mutation: Negative NRAS mutation: Negative BRAF mutation: Negative   05/17/2021 - 10/08/2021 Chemotherapy   Patient is on Treatment Plan : COLORECTAL FOLFOXIRI + Panitumumab q14d     05/17/2021 -  Chemotherapy   Patient is on Treatment Plan : COLORECTAL FOLFIRI + VECTIBIX q14d        INTERVAL HISTORY:   Diana Fox is a 52 y.o. female presenting to clinic today for follow up of stage IV sigmoid colon adenocarcinoma. She was last seen by me on 07/11/22.  Today, she states that she is doing well overall. Her appetite level is at 100%. Her energy level is at 100%.  She denies having any toxicities from treatment, including pain, abdominal cramping, and diarrhea. She also reports a normal appetite  She c/o of a worsened skin rash on her face and upper chest from last Vectibix treatment, which has improved. She notes she uses hydrocortisone for her rash.   She c/o of one episode of sharp pain in her abdomen at night while on treatment, which resolved on its own.  She reports regularly taking potassium supplements.   PAST MEDICAL HISTORY:   Past Medical History: Past Medical History:  Diagnosis Date   Anemia    Back pain    Hypertension    Port-A-Cath in place 05/10/2021    Surgical History: Past Surgical History:  Procedure Laterality Date   BIOPSY  03/02/2021   Procedure: BIOPSY;  Surgeon: Lanelle Bal, DO;  [facility];  Service: Endoscopy;;   BOWEL RESECTION N/A 03/26/2021   Procedure: SMALL BOWEL RESECTION;  Surgeon: Franky Macho,  MD;  Location: [facility];  Service: General;  Laterality: N/A;   CHOLECYSTECTOMY     COLONOSCOPY WITH PROPOFOL N/A 10/04/2021   Procedure: COLONOSCOPY WITH PROPOFOL;  Surgeon: Lanelle Bal, DO;  [facility];  Service: Endoscopy;  Laterality: N/A;  12:15PM, VIA OSTOMY   COLOSTOMY N/A 03/26/2021   Procedure: COLOSTOMY;  Surgeon: Franky Macho, MD;  Location: [facility];  Service: General;  Laterality: N/A;   ESOPHAGOGASTRODUODENOSCOPY (EGD) WITH PROPOFOL N/A 03/02/2021   Procedure: ESOPHAGOGASTRODUODENOSCOPY (EGD) WITH PROPOFOL;  Surgeon: Lanelle Bal, DO;  [facility];  Service: Endoscopy;  Laterality: N/A;   PARTIAL COLECTOMY N/A 03/26/2021   Procedure: PARTIAL COLECTOMY;  Surgeon: Franky Macho, MD;  Location: [facility];  Service: General;  Laterality: N/A;   PORTACATH PLACEMENT Left 04/14/2021   Procedure: INSERTION PORT-A-CATH;  Surgeon: Franky Macho, MD;  Location: [facility];  Service: General;  Laterality: Left;   SCLEROTHERAPY  03/02/2021   Procedure: Susa Day;  Surgeon: Lanelle Bal, DO;  [facility];  Service: Endoscopy;;   SIGMOIDOSCOPY  03/02/2021   Procedure: Daiva Huge;  Surgeon: Lanelle Bal, DO;  [facility];  Service: Endoscopy;;    Social History: Social History   Socioeconomic History   Marital status: Married    Spouse name: Not on file   Number of children: Not on file   Years of education: Not on file   Highest education level: Not on file  Occupational History   Not on file  Tobacco Use   Smoking status: Never   Smokeless tobacco: Never  Vaping Use   Vaping Use: Never used  Substance and Sexual Activity   Alcohol use: No   Drug use: No   Sexual activity: Not on file  Other Topics Concern   Not on file  Social History Narrative   Not on file   Social Determinants of Health   Financial Resource Strain: High Risk (05/19/2021)   Overall Financial Resource Strain (CARDIA)    Difficulty of Paying  Living Expenses: Hard  Food Insecurity: Not on file  Transportation Needs: Not on file  Physical Activity: Not on file  Stress: Not on file  Social Connections: Not on file  Intimate Partner Violence: Not on file    Family History: Family History  Problem Relation Age of Onset   Hypertension Mother    Hypertension Father    Colon cancer Neg Hx    Colon polyps Neg Hx    Inflammatory bowel disease Neg Hx     Current Medications:  Current Outpatient Medications:    Clindamycin-Benzoyl Per, Refr, gel, Apply 1 Application topically 2 (  two) times daily., Disp: 45 g, Rfl: 2   doxycycline (VIBRA-TABS) 100 MG tablet, Take 1 tablet (100 mg total) by mouth 2 (two) times daily., Disp: 90 tablet, Rfl: 6   ferrous sulfate 325 (65 FE) MG tablet, Take 1 tablet (325 mg total) by mouth daily with breakfast. TAKE 1 tablet BY MOUTH DAILY., Disp: 90 tablet, Rfl: 1   fexofenadine (ALLEGRA) 180 MG tablet, Take 1 tablet (180 mg total) by mouth daily., Disp: 30 tablet, Rfl: 0   fluorouracil CALGB 54098 2,400 mg/m2 in sodium chloride 0.9 % 150 mL, Inject 2,400 mg/m2 into the vein over 48 hr. Every 14 days, Disp: , Rfl:    FLUOROURACIL IV, Inject into the vein every 14 (fourteen) days., Disp: , Rfl:    fluticasone (FLONASE) 50 MCG/ACT nasal spray, Place 2 sprays into both nostrils daily., Disp: 16 g, Rfl: 12   hydrocortisone 1 % lotion, Apply 1 Application topically 2 (two) times daily., Disp: 118 mL, Rfl: 5   KLOR-CON M20 20 MEQ tablet, TAKE 1 TABLET (20 MEQ TOTAL) BY MOUTH IN THE MORNING, AT NOON, IN THE EVENING, AND AT BEDTIME., Disp: 360 tablet, Rfl: 1   lidocaine (XYLOCAINE) 2 % solution, Use as directed 10 mLs in the mouth or throat 4 (four) times daily as needed for mouth pain (Mix 1:1 with carafate and swish and spit)., Disp: 100 mL, Rfl: 3   magnesium oxide (MAG-OX) 400 (240 Mg) MG tablet, Take 1 tablet (400 mg total) by mouth 2 (two) times daily., Disp: 60 tablet, Rfl: 6   medroxyPROGESTERone  (DEPO-PROVERA) 150 MG/ML injection, Inject 150 mg into the muscle every 3 (three) months., Disp: , Rfl:    metaxalone (SKELAXIN) 800 MG tablet, Take 800 mg by mouth 3 (three) times daily as needed., Disp: , Rfl:    metroNIDAZOLE (METROGEL) 0.75 % vaginal gel, Place vaginally at bedtime., Disp: , Rfl:    Multiple Vitamin (MULTIVITAMIN) tablet, Take 1 tablet by mouth daily., Disp: , Rfl:    naproxen (NAPROSYN) 500 MG tablet, Take 500 mg by mouth 2 (two) times daily., Disp: , Rfl:    nebivolol (BYSTOLIC) 10 MG tablet, Take 1 tablet (10 mg total) by mouth daily., Disp: 30 tablet, Rfl: 1   OXALIPLATIN IV, Inject into the vein every 14 (fourteen) days., Disp: , Rfl:    oxyCODONE (OXY IR/ROXICODONE) 5 MG immediate release tablet, Take by mouth., Disp: , Rfl:    Panitumumab (VECTIBIX IV), Inject into the vein every 14 (fourteen) days., Disp: , Rfl:    pantoprazole (PROTONIX) 40 MG tablet, Take 40 mg by mouth daily as needed (acid reflux)., Disp: , Rfl:    sucralfate (CARAFATE) 1 GM/10ML suspension, Take 10 mLs (1 g total) by mouth 4 (four) times daily as needed. Mix 1:1 with 2% lidocaine viscous solution and swish and spit, Disp: 420 mL, Rfl: 6 No current facility-administered medications for this visit.  Facility-Administered Medications Ordered in Other Visits:    fluorouracil (ADRUCIL) 4,450 mg in sodium chloride 0.9 % 61 mL chemo infusion, 2,400 mg/m2 (Order-Specific), Intravenous, 1 day or 1 dose, [name], MD, Infusion Verify at 08/15/22 1406   Allergies: Allergies  Allergen Reactions   Zithromax [Azithromycin] Hives    infusing arm began to swell , her face got red, and she broke out, there are bumps around where the IV site was infusing   Motrin [Ibuprofen] Itching   Sudafed [Pseudoephedrine Hcl] Other (See Comments)    Hypertension   Zestril [Lisinopril] Cough  REVIEW OF SYSTEMS:   Review of Systems  Constitutional:  Negative for chills, fatigue and fever.  HENT:    Negative for lump/mass, mouth sores, nosebleeds, sore throat and trouble swallowing.   Eyes:  Negative for eye problems.  Respiratory:  Negative for cough and shortness of breath.   Cardiovascular:  Negative for chest pain, leg swelling and palpitations.  Gastrointestinal:  Negative for abdominal pain, constipation, diarrhea, nausea and vomiting.  Genitourinary:  Negative for bladder incontinence, difficulty urinating, dysuria, frequency, hematuria and nocturia.   Musculoskeletal:  Negative for arthralgias, back pain, flank pain, myalgias and neck pain.  Skin:  Negative for itching and rash.  Neurological:  Negative for dizziness, headaches and numbness.  Hematological:  Does not bruise/bleed easily.  Psychiatric/Behavioral:  Negative for depression, sleep disturbance and suicidal ideas. The patient is not nervous/anxious.   All other systems reviewed and are negative.    VITALS:   There were no vitals taken for this visit.  Wt Readings from Last 3 Encounters:  08/15/22 185 lb (83.9 kg)  07/25/22 182 lb (82.6 kg)  07/19/22 176 lb (79.8 kg)    There is no height or weight on file to calculate BMI.  Performance status (ECOG): 1 - Symptomatic but completely ambulatory  PHYSICAL EXAM:   Physical Exam Vitals and nursing note reviewed. Exam conducted with a chaperone present.  Constitutional:      Appearance: Normal appearance.  Cardiovascular:     Rate and Rhythm: Normal rate and regular rhythm.     Pulses: Normal pulses.     Heart sounds: Normal heart sounds.  Pulmonary:     Effort: Pulmonary effort is normal.     Breath sounds: Normal breath sounds.  Abdominal:     Palpations: Abdomen is soft. There is no hepatomegaly, splenomegaly or mass.     Tenderness: There is no abdominal tenderness.  Musculoskeletal:     Right lower leg: No edema.     Left lower leg: No edema.  Lymphadenopathy:     Cervical: No cervical adenopathy.     Right cervical: No superficial, deep or  posterior cervical adenopathy.    Left cervical: No superficial, deep or posterior cervical adenopathy.     Upper Body:     Right upper body: No supraclavicular or axillary adenopathy.     Left upper body: No supraclavicular or axillary adenopathy.  Neurological:     General: No focal deficit present.     Mental Status: She is alert and oriented to person, place, and time.  Psychiatric:        Mood and Affect: Mood normal.        Behavior: Behavior normal.     LABS:      Latest Ref Rng & Units 08/15/2022    8:29 AM 07/25/2022    8:04 AM 07/17/2022   12:05 PM  CBC  WBC 4.0 - 10.5 K/uL 8.4  3.7  4.1   Hemoglobin 12.0 - 15.0 g/dL 16.1  09.6  04.5   Hematocrit 36.0 - 46.0 % 34.9  33.3  34.9   Platelets 150 - 400 K/uL 323  262  234       Latest Ref Rng & Units 08/15/2022    8:29 AM 07/25/2022    8:04 AM 07/17/2022   12:05 PM  CMP  Glucose 70 - 99 mg/dL 409  811  914   BUN 6 - 20 mg/dL 8  6  9    Creatinine 0.44 - 1.00 mg/dL 7.82  0.74  0.74   Sodium 135 - 145 mmol/L 137  138  135   Potassium 3.5 - 5.1 mmol/L 3.5  3.0  3.3   Chloride 98 - 111 mmol/L 109  107  105   CO2 22 - 32 mmol/L 21  23  23    Calcium 8.9 - 10.3 mg/dL 8.4  8.2  8.5   Total Protein 6.5 - 8.1 g/dL 6.8  6.4  6.8   Total Bilirubin 0.3 - 1.2 mg/dL 0.6  0.5  0.7   Alkaline Phos 38 - 126 U/L 54  64  60   AST 15 - 41 U/L 16  15  33   ALT 0 - 44 U/L 16  18  21       Lab Results  Component Value Date   CEA1 34.3 (H) 07/25/2022   /  CEA  Date Value Ref Range Status  07/25/2022 34.3 (H) 0.0 - 4.7 ng/mL Final    Comment:    (NOTE)                             Nonsmokers          <3.9                             Smokers             <5.6 Roche [employer] Electrochemiluminescence Immunoassay (ECLIA) Values obtained with different assay methods or kits cannot be used interchangeably.  Results cannot be interpreted as absolute evidence of the presence or absence of malignant disease. Performed At: [facility], [place] [address] [place], [place] [id number] Jolene Schimke MD YN:[id number]    No results found for: "PSA1" No results found for: "CAN199" No results found for: "CAN125"  No results found for: "TOTALPROTELP", "ALBUMINELP", "A1GS", "A2GS", "BETS", "BETA2SER", "GAMS", "MSPIKE", "SPEI" Lab Results  Component Value Date   TIBC 256 01/11/2021   FERRITIN 108 01/11/2021   IRONPCTSAT 4 (L) 01/11/2021   No results found for: "LDH"   STUDIES:   No results found.

## 2022-08-16 ENCOUNTER — Other Ambulatory Visit: Payer: Self-pay

## 2022-08-17 ENCOUNTER — Inpatient Hospital Stay: Payer: Medicaid Other

## 2022-08-17 DIAGNOSIS — Z5111 Encounter for antineoplastic chemotherapy: Secondary | ICD-10-CM | POA: Diagnosis not present

## 2022-08-17 DIAGNOSIS — Z95828 Presence of other vascular implants and grafts: Secondary | ICD-10-CM

## 2022-08-17 DIAGNOSIS — C189 Malignant neoplasm of colon, unspecified: Secondary | ICD-10-CM

## 2022-08-17 MED ORDER — SODIUM CHLORIDE 0.9% FLUSH
10.0000 mL | INTRAVENOUS | Status: DC | PRN
  Administered 2022-08-17: 10 mL

## 2022-08-17 MED ORDER — HEPARIN SOD (PORK) LOCK FLUSH 100 UNIT/ML IV SOLN
500.0000 [IU] | Freq: Once | INTRAVENOUS | Status: AC | PRN
  Administered 2022-08-17: 500 [IU]

## 2022-08-22 ENCOUNTER — Encounter: Payer: Self-pay | Admitting: *Deleted

## 2022-08-22 NOTE — Progress Notes (Signed)
Medical Records request faxed to [facility], along with signed authorization from patient from [facility] Disability regarding ongoing claim on patient's behalf.  Records were requested from April 26, 2020 to present.

## 2022-08-29 ENCOUNTER — Inpatient Hospital Stay: Payer: Medicaid Other

## 2022-08-29 DIAGNOSIS — Z5111 Encounter for antineoplastic chemotherapy: Secondary | ICD-10-CM | POA: Diagnosis not present

## 2022-08-29 DIAGNOSIS — Z95828 Presence of other vascular implants and grafts: Secondary | ICD-10-CM

## 2022-08-29 DIAGNOSIS — C189 Malignant neoplasm of colon, unspecified: Secondary | ICD-10-CM

## 2022-08-29 DIAGNOSIS — E876 Hypokalemia: Secondary | ICD-10-CM

## 2022-08-29 LAB — CBC WITH DIFFERENTIAL/PLATELET
Abs Immature Granulocytes: 0.01 10*3/uL (ref 0.00–0.07)
Basophils Absolute: 0 10*3/uL (ref 0.0–0.1)
Basophils Relative: 1 %
Eosinophils Absolute: 0.2 10*3/uL (ref 0.0–0.5)
Eosinophils Relative: 4 %
HCT: 33.5 % — ABNORMAL LOW (ref 36.0–46.0)
Hemoglobin: 11.2 g/dL — ABNORMAL LOW (ref 12.0–15.0)
Immature Granulocytes: 0 %
Lymphocytes Relative: 44 %
Lymphs Abs: 2.1 10*3/uL (ref 0.7–4.0)
MCH: 29.2 pg (ref 26.0–34.0)
MCHC: 33.4 g/dL (ref 30.0–36.0)
MCV: 87.5 fL (ref 80.0–100.0)
Monocytes Absolute: 0.3 10*3/uL (ref 0.1–1.0)
Monocytes Relative: 7 %
Neutro Abs: 2.1 10*3/uL (ref 1.7–7.7)
Neutrophils Relative %: 44 %
Platelets: 280 10*3/uL (ref 150–400)
RBC: 3.83 MIL/uL — ABNORMAL LOW (ref 3.87–5.11)
RDW: 18.8 % — ABNORMAL HIGH (ref 11.5–15.5)
WBC: 4.7 10*3/uL (ref 4.0–10.5)
nRBC: 0 % (ref 0.0–0.2)

## 2022-08-29 LAB — COMPREHENSIVE METABOLIC PANEL
ALT: 14 U/L (ref 0–44)
AST: 16 U/L (ref 15–41)
Albumin: 3.6 g/dL (ref 3.5–5.0)
Alkaline Phosphatase: 58 U/L (ref 38–126)
Anion gap: 7 (ref 5–15)
BUN: 6 mg/dL (ref 6–20)
CO2: 21 mmol/L — ABNORMAL LOW (ref 22–32)
Calcium: 8.2 mg/dL — ABNORMAL LOW (ref 8.9–10.3)
Chloride: 108 mmol/L (ref 98–111)
Creatinine, Ser: 0.7 mg/dL (ref 0.44–1.00)
GFR, Estimated: >60 mL/min (ref >60)
Glucose, Bld: 139 mg/dL — ABNORMAL HIGH (ref 70–99)
Potassium: 2.9 mmol/L — ABNORMAL LOW (ref 3.5–5.1)
Sodium: 136 mmol/L (ref 135–145)
Total Bilirubin: 0.8 mg/dL (ref 0.3–1.2)
Total Protein: 6.9 g/dL (ref 6.5–8.1)

## 2022-08-29 LAB — MAGNESIUM: Magnesium: 1.8 mg/dL (ref 1.7–2.4)

## 2022-08-29 MED ORDER — SODIUM CHLORIDE 0.9% FLUSH: 10.0000 mL | INTRAVENOUS | Status: DC | PRN

## 2022-08-29 MED ORDER — POTASSIUM CHLORIDE CRYS ER 20 MEQ PO TBCR
40.0000 meq | EXTENDED_RELEASE_TABLET | Freq: Once | ORAL | Status: AC
  Administered 2022-08-29: 40 meq via ORAL
  Filled 2022-08-29: qty 2

## 2022-08-29 MED ORDER — POTASSIUM CHLORIDE 10 MEQ/100ML IV SOLN
10.0000 meq | INTRAVENOUS | Status: AC
  Administered 2022-08-29 (×2): 10 meq via INTRAVENOUS
  Filled 2022-08-29 (×2): qty 100

## 2022-08-29 MED ORDER — PALONOSETRON HCL INJECTION 0.25 MG/5ML
0.2500 mg | Freq: Once | INTRAVENOUS | Status: AC
  Administered 2022-08-29: 0.25 mg via INTRAVENOUS
  Filled 2022-08-29: qty 5

## 2022-08-29 MED ORDER — SODIUM CHLORIDE 0.9 % IV SOLN
10.0000 mg | Freq: Once | INTRAVENOUS | Status: AC
  Administered 2022-08-29: 10 mg via INTRAVENOUS
  Filled 2022-08-29: qty 10

## 2022-08-29 MED ORDER — SODIUM CHLORIDE 0.9 % IV SOLN
150.0000 mg | Freq: Once | INTRAVENOUS | Status: AC
  Administered 2022-08-29: 150 mg via INTRAVENOUS
  Filled 2022-08-29: qty 150

## 2022-08-29 MED ORDER — ATROPINE SULFATE 1 MG/ML IV SOLN
0.5000 mg | Freq: Once | INTRAVENOUS | Status: AC
  Administered 2022-08-29: 0.5 mg via INTRAVENOUS
  Filled 2022-08-29: qty 1

## 2022-08-29 MED ORDER — SODIUM CHLORIDE 0.9 % IV SOLN
500.0000 mg | Freq: Once | INTRAVENOUS | Status: AC
  Administered 2022-08-29: 500 mg via INTRAVENOUS
  Filled 2022-08-29: qty 20

## 2022-08-29 MED ORDER — SODIUM CHLORIDE 0.9 % IV SOLN: Freq: Once | INTRAVENOUS | Status: AC

## 2022-08-29 MED ORDER — SODIUM CHLORIDE 0.9 % IV SOLN
2400.0000 mg/m2 | INTRAVENOUS | Status: DC
  Administered 2022-08-29: 4450 mg via INTRAVENOUS
  Filled 2022-08-29: qty 89

## 2022-08-29 MED ORDER — FLUOROURACIL CHEMO INJECTION 2.5 GM/50ML
400.0000 mg/m2 | Freq: Once | INTRAVENOUS | Status: AC
  Administered 2022-08-29: 750 mg via INTRAVENOUS
  Filled 2022-08-29: qty 15

## 2022-08-29 MED ORDER — HEPARIN SOD (PORK) LOCK FLUSH 100 UNIT/ML IV SOLN: 500.0000 [IU] | Freq: Once | INTRAVENOUS | Status: DC | PRN

## 2022-08-29 MED ORDER — LEUCOVORIN CALCIUM INJECTION 350 MG
200.0000 mg/m2 | Freq: Once | INTRAVENOUS | Status: AC
  Administered 2022-08-29: 370 mg via INTRAVENOUS
  Filled 2022-08-29: qty 18.5

## 2022-08-29 MED ORDER — SODIUM CHLORIDE 0.9 % IV SOLN
165.0000 mg/m2 | Freq: Once | INTRAVENOUS | Status: AC
  Administered 2022-08-29: 300 mg via INTRAVENOUS
  Filled 2022-08-29: qty 15

## 2022-08-31 ENCOUNTER — Other Ambulatory Visit: Payer: Self-pay

## 2022-08-31 ENCOUNTER — Inpatient Hospital Stay: Payer: Medicaid Other

## 2022-08-31 VITALS — BP 127/78 | HR 72 | Temp 97.9°F | Resp 20

## 2022-08-31 DIAGNOSIS — C189 Malignant neoplasm of colon, unspecified: Secondary | ICD-10-CM

## 2022-08-31 DIAGNOSIS — Z5111 Encounter for antineoplastic chemotherapy: Secondary | ICD-10-CM | POA: Diagnosis not present

## 2022-08-31 DIAGNOSIS — Z95828 Presence of other vascular implants and grafts: Secondary | ICD-10-CM

## 2022-08-31 MED ORDER — SODIUM CHLORIDE 0.9% FLUSH
10.0000 mL | INTRAVENOUS | Status: DC | PRN
  Administered 2022-08-31: 10 mL

## 2022-08-31 MED ORDER — HEPARIN SOD (PORK) LOCK FLUSH 100 UNIT/ML IV SOLN
500.0000 [IU] | Freq: Once | INTRAVENOUS | Status: AC | PRN
  Administered 2022-08-31: 500 [IU]

## 2022-08-31 NOTE — Progress Notes (Signed)
Patient presents today for 5FU pump stop and disconnection after 46 hour continous infusion.   5FU pump deaccessed.  Patients port flushed without difficulty.  Good blood return noted with no bruising or swelling noted at site.  Needle removed intact.  Band aid applied.  VSS with discharge and left in satisfactory condition via wheelchair with no s/s of distress noted.    

## 2022-08-31 NOTE — Patient Instructions (Signed)
[facility]  Discharge Instructions: Thank you for choosing [facility] to provide your oncology and hematology care.  If you have a lab appointment with the Cancer Center - please note that after April 8th, 2024, all labs will be drawn in the cancer center.  You do not have to check in or register with the main entrance as you have in the past but will complete your check-in in the cancer center.  Wear comfortable clothing and clothing appropriate for easy access to any Portacath or PICC line.   We strive to give you quality time with your provider. You may need to reschedule your appointment if you arrive late (15 or more minutes).  Arriving late affects you and other patients whose appointments are after yours.  Also, if you miss three or more appointments without notifying the office, you may be dismissed from the clinic at the provider's discretion.      For prescription refill requests, have your pharmacy contact our office and allow 72 hours for refills to be completed.    Today you received the following chemotherapy and/or immunotherapy agents Pump stop      To help prevent nausea and vomiting after your treatment, we encourage you to take your nausea medication as directed.  BELOW ARE SYMPTOMS THAT SHOULD BE REPORTED IMMEDIATELY: *FEVER GREATER THAN 100.4 F (38 C) OR HIGHER *CHILLS OR SWEATING *NAUSEA AND VOMITING THAT IS NOT CONTROLLED WITH YOUR NAUSEA MEDICATION *UNUSUAL SHORTNESS OF BREATH *UNUSUAL BRUISING OR BLEEDING *URINARY PROBLEMS (pain or burning when urinating, or frequent urination) *BOWEL PROBLEMS (unusual diarrhea, constipation, pain near the anus) TENDERNESS IN MOUTH AND THROAT WITH OR WITHOUT PRESENCE OF ULCERS (sore throat, sores in mouth, or a toothache) UNUSUAL RASH, SWELLING OR PAIN  UNUSUAL VAGINAL DISCHARGE OR ITCHING   Items with * indicate a potential emergency and should be followed up as soon as possible or go to the  Emergency Department if any problems should occur.  Please show the CHEMOTHERAPY ALERT CARD or IMMUNOTHERAPY ALERT CARD at check-in to the Emergency Department and triage nurse.  Should you have questions after your visit or need to cancel or reschedule your appointment, please contact [facility] [phone]  and follow the prompts.  Office hours are 8:00 a.m. to 4:30 p.m. Monday - Friday. Please note that voicemails left after 4:00 p.m. may not be returned until the following business day.  We are closed weekends and major holidays. You have access to a nurse at all times for urgent questions. Please call the main number to the clinic [phone] and follow the prompts.  For any non-urgent questions, you may also contact your provider using MyChart. We now offer e-Visits for anyone 3 and older to request care online for non-urgent symptoms. For details visit [email or web address].   Also download the MyChart app! Go to the app store, search "MyChart", open the app, select [facility], and log in with your MyChart username and password.

## 2022-09-07 ENCOUNTER — Encounter: Payer: Self-pay | Admitting: *Deleted

## 2022-09-07 NOTE — Progress Notes (Signed)
Patient presented to the office with questions regarding her [facility] that was filled out initially on 06/01/22.  States that additional information is needed from [place] and I was given a telephone number for her Agent Destiny at [facility] to call and follow up.  Upon calling, I was advised that there was no claim under her name or the claim number that I provided.  Spoke with her Agent Aggie Hacker @ [phone] who informed me that the claim number that was on the paper work that had been sent to [place] was incorrect.  Actual claim number is [id number] and that Babette Relic is the Adjuster on the claim and can be reached @ [phone].  Attempted to contact she and Taejah and both were unavailable.  Will reach out to [place] [place] once I have been able to discuss case with her Adjuster.

## 2022-09-10 ENCOUNTER — Other Ambulatory Visit: Payer: Self-pay

## 2022-09-12 ENCOUNTER — Inpatient Hospital Stay: Payer: Medicaid Other | Attending: Hematology

## 2022-09-12 ENCOUNTER — Inpatient Hospital Stay: Payer: Medicaid Other

## 2022-09-12 ENCOUNTER — Inpatient Hospital Stay: Payer: Medicaid Other | Admitting: Hematology

## 2022-09-12 VITALS — BP 133/81 | HR 76 | Temp 98.0°F | Resp 18 | Wt 184.9 lb

## 2022-09-12 DIAGNOSIS — Z5111 Encounter for antineoplastic chemotherapy: Secondary | ICD-10-CM | POA: Diagnosis present

## 2022-09-12 DIAGNOSIS — E876 Hypokalemia: Secondary | ICD-10-CM

## 2022-09-12 DIAGNOSIS — C187 Malignant neoplasm of sigmoid colon: Secondary | ICD-10-CM | POA: Insufficient documentation

## 2022-09-12 DIAGNOSIS — C189 Malignant neoplasm of colon, unspecified: Secondary | ICD-10-CM

## 2022-09-12 DIAGNOSIS — Z95828 Presence of other vascular implants and grafts: Secondary | ICD-10-CM

## 2022-09-12 LAB — COMPREHENSIVE METABOLIC PANEL
ALT: 18 U/L (ref 0–44)
AST: 18 U/L (ref 15–41)
Albumin: 3.5 g/dL (ref 3.5–5.0)
Alkaline Phosphatase: 60 U/L (ref 38–126)
Anion gap: 6 (ref 5–15)
BUN: 6 mg/dL (ref 6–20)
CO2: 21 mmol/L — ABNORMAL LOW (ref 22–32)
Calcium: 8.1 mg/dL — ABNORMAL LOW (ref 8.9–10.3)
Chloride: 109 mmol/L (ref 98–111)
Creatinine, Ser: 0.78 mg/dL (ref 0.44–1.00)
GFR, Estimated: >60 mL/min (ref >60)
Glucose, Bld: 126 mg/dL — ABNORMAL HIGH (ref 70–99)
Potassium: 3 mmol/L — ABNORMAL LOW (ref 3.5–5.1)
Sodium: 136 mmol/L (ref 135–145)
Total Bilirubin: 0.4 mg/dL (ref 0.3–1.2)
Total Protein: 6.7 g/dL (ref 6.5–8.1)

## 2022-09-12 LAB — CBC WITH DIFFERENTIAL/PLATELET
Abs Immature Granulocytes: 0 10*3/uL (ref 0.00–0.07)
Basophils Absolute: 0.1 10*3/uL (ref 0.0–0.1)
Basophils Relative: 1 %
Eosinophils Absolute: 0 10*3/uL (ref 0.0–0.5)
Eosinophils Relative: 0 %
HCT: 34.3 % — ABNORMAL LOW (ref 36.0–46.0)
Hemoglobin: 11.5 g/dL — ABNORMAL LOW (ref 12.0–15.0)
Lymphocytes Relative: 43 %
Lymphs Abs: 2.5 10*3/uL (ref 0.7–4.0)
MCH: 29.6 pg (ref 26.0–34.0)
MCHC: 33.5 g/dL (ref 30.0–36.0)
MCV: 88.2 fL (ref 80.0–100.0)
Monocytes Absolute: 0.2 10*3/uL (ref 0.1–1.0)
Monocytes Relative: 4 %
Neutro Abs: 3 10*3/uL (ref 1.7–7.7)
Neutrophils Relative %: 52 %
Platelets: 266 10*3/uL (ref 150–400)
RBC: 3.89 MIL/uL (ref 3.87–5.11)
RDW: 19.6 % — ABNORMAL HIGH (ref 11.5–15.5)
WBC: 5.8 10*3/uL (ref 4.0–10.5)
nRBC: 0 % (ref 0.0–0.2)

## 2022-09-12 LAB — MAGNESIUM: Magnesium: 1.8 mg/dL (ref 1.7–2.4)

## 2022-09-12 MED ORDER — SODIUM CHLORIDE 0.9 % IV SOLN
150.0000 mg | Freq: Once | INTRAVENOUS | Status: AC
  Administered 2022-09-12: 150 mg via INTRAVENOUS
  Filled 2022-09-12: qty 150

## 2022-09-12 MED ORDER — FLUOROURACIL CHEMO INJECTION 2.5 GM/50ML
400.0000 mg/m2 | Freq: Once | INTRAVENOUS | Status: AC
  Administered 2022-09-12: 750 mg via INTRAVENOUS
  Filled 2022-09-12: qty 15

## 2022-09-12 MED ORDER — SODIUM CHLORIDE 0.9 % IV SOLN
500.0000 mg | Freq: Once | INTRAVENOUS | Status: AC
  Administered 2022-09-12: 500 mg via INTRAVENOUS
  Filled 2022-09-12: qty 20

## 2022-09-12 MED ORDER — SODIUM CHLORIDE 0.9 % IV SOLN
10.0000 mg | Freq: Once | INTRAVENOUS | Status: AC
  Administered 2022-09-12: 10 mg via INTRAVENOUS
  Filled 2022-09-12: qty 10

## 2022-09-12 MED ORDER — SODIUM CHLORIDE 0.9 % IV SOLN: Freq: Once | INTRAVENOUS | Status: AC

## 2022-09-12 MED ORDER — POTASSIUM CHLORIDE CRYS ER 20 MEQ PO TBCR
40.0000 meq | EXTENDED_RELEASE_TABLET | Freq: Once | ORAL | Status: AC
  Administered 2022-09-12: 40 meq via ORAL
  Filled 2022-09-12: qty 2

## 2022-09-12 MED ORDER — SODIUM CHLORIDE 0.9 % IV SOLN
2400.0000 mg/m2 | INTRAVENOUS | Status: DC
  Administered 2022-09-12: 4450 mg via INTRAVENOUS
  Filled 2022-09-12: qty 89

## 2022-09-12 MED ORDER — SODIUM CHLORIDE 0.9 % IV SOLN
165.0000 mg/m2 | Freq: Once | INTRAVENOUS | Status: AC
  Administered 2022-09-12: 300 mg via INTRAVENOUS
  Filled 2022-09-12: qty 15

## 2022-09-12 MED ORDER — SODIUM CHLORIDE 0.9% FLUSH: 10.0000 mL | INTRAVENOUS | Status: DC | PRN

## 2022-09-12 MED ORDER — HEPARIN SOD (PORK) LOCK FLUSH 100 UNIT/ML IV SOLN: 500.0000 [IU] | Freq: Once | INTRAVENOUS | Status: DC | PRN

## 2022-09-12 MED ORDER — LEUCOVORIN CALCIUM INJECTION 350 MG
200.0000 mg/m2 | Freq: Once | INTRAVENOUS | Status: AC
  Administered 2022-09-12: 370 mg via INTRAVENOUS
  Filled 2022-09-12: qty 18.5

## 2022-09-12 MED ORDER — ATROPINE SULFATE 1 MG/ML IV SOLN
0.5000 mg | Freq: Once | INTRAVENOUS | Status: AC
  Administered 2022-09-12: 0.5 mg via INTRAVENOUS
  Filled 2022-09-12: qty 1

## 2022-09-12 MED ORDER — PALONOSETRON HCL INJECTION 0.25 MG/5ML
0.2500 mg | Freq: Once | INTRAVENOUS | Status: AC
  Administered 2022-09-12: 0.25 mg via INTRAVENOUS
  Filled 2022-09-12: qty 5

## 2022-09-12 NOTE — Progress Notes (Signed)
Patient remained stable during treatment.  She was discharged ambulatory and in stable condition from clinic with infusion pump running.  She is aware of her appt time for Wednesday.

## 2022-09-12 NOTE — Progress Notes (Signed)
Patient presents today for chemotherapy/immunotherapy infusion of Vectibix, Camptosar, Leucovorin and Adrucil with pump.  Patient is in satisfactory condition with no new complaints voiced.  Vital signs are stable.  Labs reviewed and all labs are within treatment parameters.  Potassium 3.0, of oral potassium given. We will proceed with treatment per MD orders.

## 2022-09-12 NOTE — Patient Instructions (Signed)
[facility]  Discharge Instructions: Thank you for choosing [facility] to provide your oncology and hematology care.  If you have a lab appointment with the Cancer Center - please note that after April 8th, 2024, all labs will be drawn in the cancer center.  You do not have to check in or register with the main entrance as you have in the past but will complete your check-in in the cancer center.  Wear comfortable clothing and clothing appropriate for easy access to any Portacath or PICC line.   We strive to give you quality time with your provider. You may need to reschedule your appointment if you arrive late (15 or more minutes).  Arriving late affects you and other patients whose appointments are after yours.  Also, if you miss three or more appointments without notifying the office, you may be dismissed from the clinic at the provider's discretion.      For prescription refill requests, have your pharmacy contact our office and allow 72 hours for refills to be completed.    Today you received the following chemotherapy and/or immunotherapy agents Folfirinox, potassium      To help prevent nausea and vomiting after your treatment, we encourage you to take your nausea medication as directed.  BELOW ARE SYMPTOMS THAT SHOULD BE REPORTED IMMEDIATELY: *FEVER GREATER THAN 100.4 F (38 C) OR HIGHER *CHILLS OR SWEATING *NAUSEA AND VOMITING THAT IS NOT CONTROLLED WITH YOUR NAUSEA MEDICATION *UNUSUAL SHORTNESS OF BREATH *UNUSUAL BRUISING OR BLEEDING *URINARY PROBLEMS (pain or burning when urinating, or frequent urination) *BOWEL PROBLEMS (unusual diarrhea, constipation, pain near the anus) TENDERNESS IN MOUTH AND THROAT WITH OR WITHOUT PRESENCE OF ULCERS (sore throat, sores in mouth, or a toothache) UNUSUAL RASH, SWELLING OR PAIN  UNUSUAL VAGINAL DISCHARGE OR ITCHING   Items with * indicate a potential emergency and should be followed up as soon as possible or  go to the Emergency Department if any problems should occur.  Please show the CHEMOTHERAPY ALERT CARD or IMMUNOTHERAPY ALERT CARD at check-in to the Emergency Department and triage nurse.  Should you have questions after your visit or need to cancel or reschedule your appointment, please contact [facility] AT [facility] [phone]  and follow the prompts.  Office hours are 8:00 a.m. to 4:30 p.m. Monday - Friday. Please note that voicemails left after 4:00 p.m. may not be returned until the following business day.  We are closed weekends and major holidays. You have access to a nurse at all times for urgent questions. Please call the main number to the clinic [phone] and follow the prompts.  For any non-urgent questions, you may also contact your provider using MyChart. We now offer e-Visits for anyone 36 and older to request care online for non-urgent symptoms. For details visit [email or web address].   Also download the MyChart app! Go to the app store, search "MyChart", open the app, select [facility], and log in with your MyChart username and password.

## 2022-09-14 ENCOUNTER — Inpatient Hospital Stay: Payer: Medicaid Other

## 2022-09-14 VITALS — BP 128/76 | HR 70 | Temp 98.3°F | Resp 18

## 2022-09-14 DIAGNOSIS — Z5111 Encounter for antineoplastic chemotherapy: Secondary | ICD-10-CM | POA: Diagnosis not present

## 2022-09-14 DIAGNOSIS — Z95828 Presence of other vascular implants and grafts: Secondary | ICD-10-CM

## 2022-09-14 DIAGNOSIS — C189 Malignant neoplasm of colon, unspecified: Secondary | ICD-10-CM

## 2022-09-14 MED ORDER — SODIUM CHLORIDE 0.9% FLUSH
10.0000 mL | INTRAVENOUS | Status: DC | PRN
  Administered 2022-09-14: 10 mL

## 2022-09-14 MED ORDER — HEPARIN SOD (PORK) LOCK FLUSH 100 UNIT/ML IV SOLN
500.0000 [IU] | Freq: Once | INTRAVENOUS | Status: AC | PRN
  Administered 2022-09-14: 500 [IU]

## 2022-09-14 NOTE — Progress Notes (Signed)
Patient presents today for pump d/c. Vital signs are stable. Port a cath site clean, dry, and intact. Port flushed with 10 mls of Normal Saline and 500 Units of Heparin. Needle removed intact. Band aid applied. Patient has no complaints at this time. Discharged from clinic ambulatory in stable condition. Alert and oriented x 3. F/U with [facility] as scheduled.

## 2022-09-14 NOTE — Patient Instructions (Signed)
[facility]  Discharge Instructions: Thank you for choosing [facility] to provide your oncology and hematology care.  If you have a lab appointment with the Cancer Center - please note that after April 8th, 2024, all labs will be drawn in the cancer center.  You do not have to check in or register with the main entrance as you have in the past but will complete your check-in in the cancer center.  Wear comfortable clothing and clothing appropriate for easy access to any Portacath or PICC line.   We strive to give you quality time with your provider. You may need to reschedule your appointment if you arrive late (15 or more minutes).  Arriving late affects you and other patients whose appointments are after yours.  Also, if you miss three or more appointments without notifying the office, you may be dismissed from the clinic at the provider's discretion.      For prescription refill requests, have your pharmacy contact our office and allow 72 hours for refills to be completed.    Today you received the following chemotherapy and/or immunotherapy agents 5FU pump d/c.       To help prevent nausea and vomiting after your treatment, we encourage you to take your nausea medication as directed.  BELOW ARE SYMPTOMS THAT SHOULD BE REPORTED IMMEDIATELY: *FEVER GREATER THAN 100.4 F (38 C) OR HIGHER *CHILLS OR SWEATING *NAUSEA AND VOMITING THAT IS NOT CONTROLLED WITH YOUR NAUSEA MEDICATION *UNUSUAL SHORTNESS OF BREATH *UNUSUAL BRUISING OR BLEEDING *URINARY PROBLEMS (pain or burning when urinating, or frequent urination) *BOWEL PROBLEMS (unusual diarrhea, constipation, pain near the anus) TENDERNESS IN MOUTH AND THROAT WITH OR WITHOUT PRESENCE OF ULCERS (sore throat, sores in mouth, or a toothache) UNUSUAL RASH, SWELLING OR PAIN  UNUSUAL VAGINAL DISCHARGE OR ITCHING   Items with * indicate a potential emergency and should be followed up as soon as possible or go to  the Emergency Department if any problems should occur.  Please show the CHEMOTHERAPY ALERT CARD or IMMUNOTHERAPY ALERT CARD at check-in to the Emergency Department and triage nurse.  Should you have questions after your visit or need to cancel or reschedule your appointment, please contact [facility] AT [facility] [phone]  and follow the prompts.  Office hours are 8:00 a.m. to 4:30 p.m. Monday - Friday. Please note that voicemails left after 4:00 p.m. may not be returned until the following business day.  We are closed weekends and major holidays. You have access to a nurse at all times for urgent questions. Please call the main number to the clinic [phone] and follow the prompts.  For any non-urgent questions, you may also contact your provider using MyChart. We now offer e-Visits for anyone 97 and older to request care online for non-urgent symptoms. For details visit [email or web address].   Also download the MyChart app! Go to the app store, search "MyChart", open the app, select [place], and log in with your MyChart username and password.

## 2022-09-20 ENCOUNTER — Encounter: Payer: Self-pay | Admitting: *Deleted

## 2022-09-21 ENCOUNTER — Ambulatory Visit (HOSPITAL_COMMUNITY): Payer: Medicaid Other

## 2022-09-26 ENCOUNTER — Inpatient Hospital Stay: Payer: Medicaid Other

## 2022-09-26 ENCOUNTER — Inpatient Hospital Stay: Payer: Medicaid Other | Admitting: Hematology

## 2022-09-26 VITALS — BP 135/78 | HR 73 | Temp 97.7°F | Resp 18

## 2022-09-26 DIAGNOSIS — Z95828 Presence of other vascular implants and grafts: Secondary | ICD-10-CM

## 2022-09-26 DIAGNOSIS — E876 Hypokalemia: Secondary | ICD-10-CM

## 2022-09-26 DIAGNOSIS — C189 Malignant neoplasm of colon, unspecified: Secondary | ICD-10-CM

## 2022-09-26 DIAGNOSIS — Z5111 Encounter for antineoplastic chemotherapy: Secondary | ICD-10-CM | POA: Diagnosis not present

## 2022-09-26 LAB — COMPREHENSIVE METABOLIC PANEL
ALT: 20 U/L (ref 0–44)
AST: 15 U/L (ref 15–41)
Albumin: 3.4 g/dL — ABNORMAL LOW (ref 3.5–5.0)
Alkaline Phosphatase: 57 U/L (ref 38–126)
Anion gap: 9 (ref 5–15)
BUN: 9 mg/dL (ref 6–20)
CO2: 20 mmol/L — ABNORMAL LOW (ref 22–32)
Calcium: 8.3 mg/dL — ABNORMAL LOW (ref 8.9–10.3)
Chloride: 106 mmol/L (ref 98–111)
Creatinine, Ser: 0.68 mg/dL (ref 0.44–1.00)
GFR, Estimated: >60 mL/min (ref >60)
Glucose, Bld: 104 mg/dL — ABNORMAL HIGH (ref 70–99)
Potassium: 3.3 mmol/L — ABNORMAL LOW (ref 3.5–5.1)
Sodium: 135 mmol/L (ref 135–145)
Total Bilirubin: 0.5 mg/dL (ref 0.3–1.2)
Total Protein: 6.4 g/dL — ABNORMAL LOW (ref 6.5–8.1)

## 2022-09-26 LAB — CBC WITH DIFFERENTIAL/PLATELET
Abs Immature Granulocytes: 0.02 10*3/uL (ref 0.00–0.07)
Basophils Absolute: 0 10*3/uL (ref 0.0–0.1)
Basophils Relative: 1 %
Eosinophils Absolute: 0.1 10*3/uL (ref 0.0–0.5)
Eosinophils Relative: 2 %
HCT: 34.5 % — ABNORMAL LOW (ref 36.0–46.0)
Hemoglobin: 11.5 g/dL — ABNORMAL LOW (ref 12.0–15.0)
Immature Granulocytes: 0 %
Lymphocytes Relative: 47 %
Lymphs Abs: 2.5 10*3/uL (ref 0.7–4.0)
MCH: 29.8 pg (ref 26.0–34.0)
MCHC: 33.3 g/dL (ref 30.0–36.0)
MCV: 89.4 fL (ref 80.0–100.0)
Monocytes Absolute: 0.6 10*3/uL (ref 0.1–1.0)
Monocytes Relative: 11 %
Neutro Abs: 2 10*3/uL (ref 1.7–7.7)
Neutrophils Relative %: 39 %
Platelets: 259 10*3/uL (ref 150–400)
RBC: 3.86 MIL/uL — ABNORMAL LOW (ref 3.87–5.11)
RDW: 19.5 % — ABNORMAL HIGH (ref 11.5–15.5)
WBC: 5.2 10*3/uL (ref 4.0–10.5)
nRBC: 0 % (ref 0.0–0.2)

## 2022-09-26 LAB — MAGNESIUM: Magnesium: 1.7 mg/dL (ref 1.7–2.4)

## 2022-09-26 MED ORDER — FLUOROURACIL CHEMO INJECTION 2.5 GM/50ML
400.0000 mg/m2 | Freq: Once | INTRAVENOUS | Status: AC
  Administered 2022-09-26: 750 mg via INTRAVENOUS
  Filled 2022-09-26: qty 15

## 2022-09-26 MED ORDER — SODIUM CHLORIDE 0.9 % IV SOLN
6.2000 mg/kg | Freq: Once | INTRAVENOUS | Status: AC
  Administered 2022-09-26: 500 mg via INTRAVENOUS
  Filled 2022-09-26: qty 20

## 2022-09-26 MED ORDER — SODIUM CHLORIDE 0.9 % IV SOLN
150.0000 mg | Freq: Once | INTRAVENOUS | Status: AC
  Administered 2022-09-26: 150 mg via INTRAVENOUS
  Filled 2022-09-26: qty 150

## 2022-09-26 MED ORDER — POTASSIUM CHLORIDE CRYS ER 20 MEQ PO TBCR
40.0000 meq | EXTENDED_RELEASE_TABLET | Freq: Once | ORAL | Status: AC
  Administered 2022-09-26: 40 meq via ORAL
  Filled 2022-09-26: qty 2

## 2022-09-26 MED ORDER — SODIUM CHLORIDE 0.9% FLUSH
10.0000 mL | Freq: Once | INTRAVENOUS | Status: AC
  Administered 2022-09-26: 10 mL via INTRAVENOUS

## 2022-09-26 MED ORDER — PALONOSETRON HCL INJECTION 0.25 MG/5ML
0.2500 mg | Freq: Once | INTRAVENOUS | Status: AC
  Administered 2022-09-26: 0.25 mg via INTRAVENOUS
  Filled 2022-09-26: qty 5

## 2022-09-26 MED ORDER — SODIUM CHLORIDE 0.9 % IV SOLN
10.0000 mg | Freq: Once | INTRAVENOUS | Status: AC
  Administered 2022-09-26: 10 mg via INTRAVENOUS
  Filled 2022-09-26: qty 10

## 2022-09-26 MED ORDER — SODIUM CHLORIDE 0.9 % IV SOLN
200.0000 mg/m2 | Freq: Once | INTRAVENOUS | Status: AC
  Administered 2022-09-26: 370 mg via INTRAVENOUS
  Filled 2022-09-26: qty 18.5

## 2022-09-26 MED ORDER — SODIUM CHLORIDE 0.9 % IV SOLN
2400.0000 mg/m2 | INTRAVENOUS | Status: DC
  Administered 2022-09-26: 4450 mg via INTRAVENOUS
  Filled 2022-09-26: qty 89

## 2022-09-26 MED ORDER — SODIUM CHLORIDE 0.9 % IV SOLN
165.0000 mg/m2 | Freq: Once | INTRAVENOUS | Status: AC
  Administered 2022-09-26: 300 mg via INTRAVENOUS
  Filled 2022-09-26: qty 15

## 2022-09-26 MED ORDER — ATROPINE SULFATE 1 MG/ML IV SOLN
0.5000 mg | Freq: Once | INTRAVENOUS | Status: AC
  Administered 2022-09-26: 0.5 mg via INTRAVENOUS
  Filled 2022-09-26: qty 1

## 2022-09-26 MED ORDER — SODIUM CHLORIDE 0.9 % IV SOLN: Freq: Once | INTRAVENOUS | Status: AC

## 2022-09-26 NOTE — Patient Instructions (Signed)
[facility]  Discharge Instructions: Thank you for choosing [facility] to provide your oncology and hematology care.  If you have a lab appointment with the Cancer Center - please note that after April 8th, 2024, all labs will be drawn in the cancer center.  You do not have to check in or register with the main entrance as you have in the past but will complete your check-in in the cancer center.  Wear comfortable clothing and clothing appropriate for easy access to any Portacath or PICC line.   We strive to give you quality time with your provider. You may need to reschedule your appointment if you arrive late (15 or more minutes).  Arriving late affects you and other patients whose appointments are after yours.  Also, if you miss three or more appointments without notifying the office, you may be dismissed from the clinic at the provider's discretion.      For prescription refill requests, have your pharmacy contact our office and allow 72 hours for refills to be completed.    Today you received the following chemotherapy and/or immunotherapy agents Vectibix/Irinotecan/Leucovorin/5FU.  Panitumumab Injection What is this medication? PANITUMUMAB (pan i TOOM ue mab) treats colorectal cancer. It works by blocking a protein that causes cancer cells to grow and multiply. This helps to slow or stop the spread of cancer cells. It is a monoclonal antibody. This medicine may be used for other purposes; ask your health care provider or pharmacist if you have questions. COMMON BRAND NAME(S): Vectibix What should I tell my care team before I take this medication? They need to know if you have any of these conditions: Eye disease Low levels of magnesium in the blood Lung disease An unusual or allergic reaction to panitumumab, other medications, foods, dyes, or preservatives Pregnant or trying to get pregnant Breast-feeding How should I use this medication? This  medication is injected into a vein. It is given by your care team in a hospital or clinic setting. Talk to your care team about the use of this medication in children. Special care may be needed. Overdosage: If you think you have taken too much of this medicine contact a poison control center or emergency room at once. NOTE: This medicine is only for you. Do not share this medicine with others. What if I miss a dose? Keep appointments for follow-up doses. It is important not to miss your dose. Call your care team if you are unable to keep an appointment. What may interact with this medication? Bevacizumab This list may not describe all possible interactions. Give your health care provider a list of all the medicines, herbs, non-prescription drugs, or dietary supplements you use. Also tell them if you smoke, drink alcohol, or use illegal drugs. Some items may interact with your medicine. What should I watch for while using this medication? Your condition will be monitored carefully while you are receiving this medication. This medication may make you feel generally unwell. This is not uncommon as chemotherapy can affect healthy cells as well as cancer cells. Report any side effects. Continue your course of treatment even though you feel ill unless your care team tells you to stop. This medication can make you more sensitive to the sun. Keep out of the sun while receiving this medication and for 2 months after stopping therapy. If you cannot avoid being in the sun, wear protective clothing and sunscreen. Do not use sun lamps, tanning beds, or tanning booths.  Check with your care team if you have severe diarrhea, nausea, and vomiting or if you sweat a lot. The loss of too much body fluid may make it dangerous for you to take this medication. This medication may cause serious skin reactions. They can happen weeks to months after starting the medication. Contact your care team right away if you notice  fevers or flu-like symptoms with a rash. The rash may be red or purple and then turn into blisters or peeling of the skin. You may also notice a red rash with swelling of the face, lips, or lymph nodes in your neck or under your arms. Talk to your care team if you may be pregnant. Serious birth defects can occur if you take this medication during pregnancy and for 2 months after the last dose. Contraception is recommended while taking this medication and for 2 months after the last dose. Your care team can help you find the option that works for you. Do not breastfeed while taking this medication and for 2 months after the last dose. This medication may cause infertility. Talk to your care team if you are concerned about your fertility. What side effects may I notice from receiving this medication? Side effects that you should report to your care team as soon as possible: Allergic reactions--skin rash, itching, hives, swelling of the face, lips, tongue, or throat Dry cough, shortness of breath or trouble breathing Eye pain, redness, irritation, or discharge with blurry or decreased vision Infusion reactions--chest pain, shortness of breath or trouble breathing, feeling faint or lightheaded Low magnesium level--muscle pain or cramps, unusual weakness or fatigue, fast or irregular heartbeat, tremors Low potassium level--muscle pain or cramps, unusual weakness or fatigue, fast or irregular heartbeat, constipation Redness, blistering, peeling, or loosening of the skin, including inside the mouth Skin reactions on sun-exposed areas Side effects that usually do not require medical attention (report to your care team if they continue or are bothersome): Change in nail shape, thickness, or color Diarrhea Dry skin Fatigue Nausea Vomiting This list may not describe all possible side effects. Call your doctor for medical advice about side effects. You may report side effects to FDA at  1-800-FDA-1088. Where should I keep my medication? This medication is given in a hospital or clinic. It will not be stored at home. NOTE: This sheet is a summary. It may not cover all possible information. If you have questions about this medicine, talk to your doctor, pharmacist, or health care provider.  2024 Elsevier/Gold Standard (2021-06-09 00:00:00)    Irinotecan Injection What is this medication? IRINOTECAN (ir in oh TEE kan) treats some types of cancer. It works by slowing down the growth of cancer cells. This medicine may be used for other purposes; ask your health care provider or pharmacist if you have questions. COMMON BRAND NAME(S): Camptosar What should I tell my care team before I take this medication? They need to know if you have any of these conditions: Dehydration Diarrhea Infection, especially a viral infection, such as chickenpox, cold sores, herpes Liver disease Low blood cell levels (white cells, red cells, and platelets) Low levels of electrolytes, such as calcium, magnesium, or potassium in your blood Recent or ongoing radiation An unusual or allergic reaction to irinotecan, other medications, foods, dyes, or preservatives If you or your partner are pregnant or trying to get pregnant Breast-feeding How should I use this medication? This medication is injected into a vein. It is given by your care team in a  hospital or clinic setting. Talk to your care team about the use of this medication in children. Special care may be needed. Overdosage: If you think you have taken too much of this medicine contact a poison control center or emergency room at once. NOTE: This medicine is only for you. Do not share this medicine with others. What if I miss a dose? Keep appointments for follow-up doses. It is important not to miss your dose. Call your care team if you are unable to keep an appointment. What may interact with this medication? Do not take this medication with  any of the following: Cobicistat Itraconazole This medication may also interact with the following: Certain antibiotics, such as clarithromycin, rifampin, rifabutin Certain antivirals for HIV or AIDS Certain medications for fungal infections, such as ketoconazole, posaconazole, voriconazole Certain medications for seizures, such as carbamazepine, phenobarbital, phenytoin Gemfibrozil Nefazodone St. John's wort This list may not describe all possible interactions. Give your health care provider a list of all the medicines, herbs, non-prescription drugs, or dietary supplements you use. Also tell them if you smoke, drink alcohol, or use illegal drugs. Some items may interact with your medicine. What should I watch for while using this medication? Your condition will be monitored carefully while you are receiving this medication. You may need blood work while taking this medication. This medication may make you feel generally unwell. This is not uncommon as chemotherapy can affect healthy cells as well as cancer cells. Report any side effects. Continue your course of treatment even though you feel ill unless your care team tells you to stop. This medication can cause serious side effects. To reduce the risk, your care team may give you other medications to take before receiving this one. Be sure to follow the directions from your care team. This medication may affect your coordination, reaction time, or judgement. Do not drive or operate machinery until you know how this medication affects you. Sit up or stand slowly to reduce the risk of dizzy or fainting spells. Drinking alcohol with this medication can increase the risk of these side effects. This medication may increase your risk of getting an infection. Call your care team for advice if you get a fever, chills, sore throat, or other symptoms of a cold or flu. Do not treat yourself. Try to avoid being around people who are sick. Avoid taking  medications that contain aspirin, acetaminophen, ibuprofen, naproxen, or ketoprofen unless instructed by your care team. These medications may hide a fever. This medication may increase your risk to bruise or bleed. Call your care team if you notice any unusual bleeding. Be careful brushing or flossing your teeth or using a toothpick because you may get an infection or bleed more easily. If you have any dental work done, tell your dentist you are receiving this medication. Talk to your care team if you or your partner are pregnant or think either of you might be pregnant. This medication can cause serious birth defects if taken during pregnancy and for 6 months after the last dose. You will need a negative pregnancy test before starting this medication. Contraception is recommended while taking this medication and for 6 months after the last dose. Your care team can help you find the option that works for you. Do not father a child while taking this medication and for 3 months after the last dose. Use a condom for contraception during this time period. Do not breastfeed while taking this medication and for 7 days  after the last dose. This medication may cause infertility. Talk to your care team if you are concerned about your fertility. What side effects may I notice from receiving this medication? Side effects that you should report to your care team as soon as possible: Allergic reactions--skin rash, itching, hives, swelling of the face, lips, tongue, or throat Dry cough, shortness of breath or trouble breathing Increased saliva or tears, increased sweating, stomach cramping, diarrhea, small pupils, unusual weakness or fatigue, slow heartbeat Infection--fever, chills, cough, sore throat, wounds that don't heal, pain or trouble when passing urine, general feeling of discomfort or being unwell Kidney injury--decrease in the amount of urine, swelling of the ankles, hands, or feet Low red blood cell  level--unusual weakness or fatigue, dizziness, headache, trouble breathing Severe or prolonged diarrhea Unusual bruising or bleeding Side effects that usually do not require medical attention (report to your care team if they continue or are bothersome): Constipation Diarrhea Hair loss Loss of appetite Nausea Stomach pain This list may not describe all possible side effects. Call your doctor for medical advice about side effects. You may report side effects to FDA at 1-800-FDA-1088. Where should I keep my medication? This medication is given in a hospital or clinic. It will not be stored at home. NOTE: This sheet is a summary. It may not cover all possible information. If you have questions about this medicine, talk to your doctor, pharmacist, or health care provider.  2024 Elsevier/Gold Standard (2021-06-07 00:00:00)    Leucovorin Injection What is this medication? LEUCOVORIN (loo koe VOR in) prevents side effects from certain medications, such as methotrexate. It works by increasing folate levels. This helps protect healthy cells in your body. It may also be used to treat anemia caused by low levels of folate. It can also be used with fluorouracil, a type of chemotherapy, to treat colorectal cancer. It works by increasing the effects of fluorouracil in the body. This medicine may be used for other purposes; ask your health care provider or pharmacist if you have questions. What should I tell my care team before I take this medication? They need to know if you have any of these conditions: Anemia from low levels of vitamin B12 in the blood An unusual or allergic reaction to leucovorin, folic acid, other medications, foods, dyes, or preservatives Pregnant or trying to get pregnant Breastfeeding How should I use this medication? This medication is injected into a vein or a muscle. It is given by your care team in a hospital or clinic setting. Talk to your care team about the use of this  medication in children. Special care may be needed. Overdosage: If you think you have taken too much of this medicine contact a poison control center or emergency room at once. NOTE: This medicine is only for you. Do not share this medicine with others. What if I miss a dose? Keep appointments for follow-up doses. It is important not to miss your dose. Call your care team if you are unable to keep an appointment. What may interact with this medication? Capecitabine Fluorouracil Phenobarbital Phenytoin Primidone Trimethoprim;sulfamethoxazole This list may not describe all possible interactions. Give your health care provider a list of all the medicines, herbs, non-prescription drugs, or dietary supplements you use. Also tell them if you smoke, drink alcohol, or use illegal drugs. Some items may interact with your medicine. What should I watch for while using this medication? Your condition will be monitored carefully while you are receiving this medication. This  medication may increase the side effects of 5-fluorouracil. Tell your care team if you have diarrhea or mouth sores that do not get better or that get worse. What side effects may I notice from receiving this medication? Side effects that you should report to your care team as soon as possible: Allergic reactions--skin rash, itching, hives, swelling of the face, lips, tongue, or throat This list may not describe all possible side effects. Call your doctor for medical advice about side effects. You may report side effects to FDA at 1-800-FDA-1088. Where should I keep my medication? This medication is given in a hospital or clinic. It will not be stored at home. NOTE: This sheet is a summary. It may not cover all possible information. If you have questions about this medicine, talk to your doctor, pharmacist, or health care provider.  2024 Elsevier/Gold Standard (2021-06-29 00:00:00)    Fluorouracil Injection What is this  medication? FLUOROURACIL (flure oh YOOR a sil) treats some types of cancer. It works by slowing down the growth of cancer cells. This medicine may be used for other purposes; ask your health care provider or pharmacist if you have questions. COMMON BRAND NAME(S): Adrucil What should I tell my care team before I take this medication? They need to know if you have any of these conditions: Blood disorders Dihydropyrimidine dehydrogenase (DPD) deficiency Infection, such as chickenpox, cold sores, herpes Kidney disease Liver disease Poor nutrition Recent or ongoing radiation therapy An unusual or allergic reaction to fluorouracil, other medications, foods, dyes, or preservatives If you or your partner are pregnant or trying to get pregnant Breast-feeding How should I use this medication? This medication is injected into a vein. It is administered by your care team in a hospital or clinic setting. Talk to your care team about the use of this medication in children. Special care may be needed. Overdosage: If you think you have taken too much of this medicine contact a poison control center or emergency room at once. NOTE: This medicine is only for you. Do not share this medicine with others. What if I miss a dose? Keep appointments for follow-up doses. It is important not to miss your dose. Call your care team if you are unable to keep an appointment. What may interact with this medication? Do not take this medication with any of the following: Live virus vaccines This medication may also interact with the following: Medications that treat or prevent blood clots, such as warfarin, enoxaparin, dalteparin This list may not describe all possible interactions. Give your health care provider a list of all the medicines, herbs, non-prescription drugs, or dietary supplements you use. Also tell them if you smoke, drink alcohol, or use illegal drugs. Some items may interact with your medicine. What  should I watch for while using this medication? Your condition will be monitored carefully while you are receiving this medication. This medication may make you feel generally unwell. This is not uncommon as chemotherapy can affect healthy cells as well as cancer cells. Report any side effects. Continue your course of treatment even though you feel ill unless your care team tells you to stop. In some cases, you may be given additional medications to help with side effects. Follow all directions for their use. This medication may increase your risk of getting an infection. Call your care team for advice if you get a fever, chills, sore throat, or other symptoms of a cold or flu. Do not treat yourself. Try to avoid being around  people who are sick. This medication may increase your risk to bruise or bleed. Call your care team if you notice any unusual bleeding. Be careful brushing or flossing your teeth or using a toothpick because you may get an infection or bleed more easily. If you have any dental work done, tell your dentist you are receiving this medication. Avoid taking medications that contain aspirin, acetaminophen, ibuprofen, naproxen, or ketoprofen unless instructed by your care team. These medications may hide a fever. Do not treat diarrhea with over the counter products. Contact your care team if you have diarrhea that lasts more than 2 days or if it is severe and watery. This medication can make you more sensitive to the sun. Keep out of the sun. If you cannot avoid being in the sun, wear protective clothing and sunscreen. Do not use sun lamps, tanning beds, or tanning booths. Talk to your care team if you or your partner wish to become pregnant or think you might be pregnant. This medication can cause serious birth defects if taken during pregnancy and for 3 months after the last dose. A reliable form of contraception is recommended while taking this medication and for 3 months after the last  dose. Talk to your care team about effective forms of contraception. Do not father a child while taking this medication and for 3 months after the last dose. Use a condom while having sex during this time period. Do not breastfeed while taking this medication. This medication may cause infertility. Talk to your care team if you are concerned about your fertility. What side effects may I notice from receiving this medication? Side effects that you should report to your care team as soon as possible: Allergic reactions--skin rash, itching, hives, swelling of the face, lips, tongue, or throat Heart attack--pain or tightness in the chest, shoulders, arms, or jaw, nausea, shortness of breath, cold or clammy skin, feeling faint or lightheaded Heart failure--shortness of breath, swelling of the ankles, feet, or hands, sudden weight gain, unusual weakness or fatigue Heart rhythm changes--fast or irregular heartbeat, dizziness, feeling faint or lightheaded, chest pain, trouble breathing High ammonia level--unusual weakness or fatigue, confusion, loss of appetite, nausea, vomiting, seizures Infection--fever, chills, cough, sore throat, wounds that don't heal, pain or trouble when passing urine, general feeling of discomfort or being unwell Low red blood cell level--unusual weakness or fatigue, dizziness, headache, trouble breathing Pain, tingling, or numbness in the hands or feet, muscle weakness, change in vision, confusion or trouble speaking, loss of balance or coordination, trouble walking, seizures Redness, swelling, and blistering of the skin over hands and feet Severe or prolonged diarrhea Unusual bruising or bleeding Side effects that usually do not require medical attention (report to your care team if they continue or are bothersome): Dry skin Headache Increased tears Nausea Pain, redness, or swelling with sores inside the mouth or throat Sensitivity to light Vomiting This list may not  describe all possible side effects. Call your doctor for medical advice about side effects. You may report side effects to FDA at 1-800-FDA-1088. Where should I keep my medication? This medication is given in a hospital or clinic. It will not be stored at home. NOTE: This sheet is a summary. It may not cover all possible information. If you have questions about this medicine, talk to your doctor, pharmacist, or health care provider.  2024 Elsevier/Gold Standard (2021-06-01 00:00:00)        To help prevent nausea and vomiting after your treatment, we encourage  you to take your nausea medication as directed.  BELOW ARE SYMPTOMS THAT SHOULD BE REPORTED IMMEDIATELY: *FEVER GREATER THAN 100.4 F (38 C) OR HIGHER *CHILLS OR SWEATING *NAUSEA AND VOMITING THAT IS NOT CONTROLLED WITH YOUR NAUSEA MEDICATION *UNUSUAL SHORTNESS OF BREATH *UNUSUAL BRUISING OR BLEEDING *URINARY PROBLEMS (pain or burning when urinating, or frequent urination) *BOWEL PROBLEMS (unusual diarrhea, constipation, pain near the anus) TENDERNESS IN MOUTH AND THROAT WITH OR WITHOUT PRESENCE OF ULCERS (sore throat, sores in mouth, or a toothache) UNUSUAL RASH, SWELLING OR PAIN  UNUSUAL VAGINAL DISCHARGE OR ITCHING   Items with * indicate a potential emergency and should be followed up as soon as possible or go to the Emergency Department if any problems should occur.  Please show the CHEMOTHERAPY ALERT CARD or IMMUNOTHERAPY ALERT CARD at check-in to the Emergency Department and triage nurse.  Should you have questions after your visit or need to cancel or reschedule your appointment, please contact [facility] [phone]  and follow the prompts.  Office hours are 8:00 a.m. to 4:30 p.m. Monday - Friday. Please note that voicemails left after 4:00 p.m. may not be returned until the following business day.  We are closed weekends and major holidays. You have access to a nurse at all times for urgent questions.  Please call the main number to the clinic [phone] and follow the prompts.  For any non-urgent questions, you may also contact your provider using MyChart. We now offer e-Visits for anyone 30 and older to request care online for non-urgent symptoms. For details visit [email or web address].   Also download the MyChart app! Go to the app store, search "MyChart", open the app, select [facility], and log in with your MyChart username and password.

## 2022-09-26 NOTE — Progress Notes (Signed)
Patient presents today for chemotherapy infusion.  Patient is in satisfactory condition with no new complaints voiced.  Vital signs are stable.  Labs reviewed and all labs are within treatment parameters.  Potassium today is 3.3.  We will give Klor Con 40 mEq PO x one dose today per standing orders by Dr. Ellin Saba.  We will proceed with treatment per MD orders.    Patient tolerated treatment well with no complaints voiced.  Home infusion 5FU pump connected with no issues.  Patient left ambulatory in stable condition.  Vital signs stable at discharge.  Follow up as scheduled.

## 2022-09-26 NOTE — Progress Notes (Signed)
Patients port flushed without difficulty.  Good blood return noted with no bruising or swelling noted at site.  Stable during access and blood draw.  Patient to remain accessed for treatment. 

## 2022-09-28 ENCOUNTER — Inpatient Hospital Stay: Payer: Medicaid Other

## 2022-09-28 VITALS — BP 125/75 | HR 64 | Temp 98.1°F | Resp 20

## 2022-09-28 DIAGNOSIS — Z95828 Presence of other vascular implants and grafts: Secondary | ICD-10-CM

## 2022-09-28 DIAGNOSIS — C189 Malignant neoplasm of colon, unspecified: Secondary | ICD-10-CM

## 2022-09-28 DIAGNOSIS — Z5111 Encounter for antineoplastic chemotherapy: Secondary | ICD-10-CM | POA: Diagnosis not present

## 2022-09-28 MED ORDER — HEPARIN SOD (PORK) LOCK FLUSH 100 UNIT/ML IV SOLN
500.0000 [IU] | Freq: Once | INTRAVENOUS | Status: AC | PRN
  Administered 2022-09-28: 500 [IU]

## 2022-09-28 MED ORDER — SODIUM CHLORIDE 0.9% FLUSH
10.0000 mL | INTRAVENOUS | Status: DC | PRN
  Administered 2022-09-28: 10 mL

## 2022-09-28 NOTE — Patient Instructions (Signed)

## 2022-09-28 NOTE — Progress Notes (Signed)
Patients port flushed without difficulty.  HGood blood return noted with no bruising or swelling noted at site.  Band aid applied.  Home infusion 5FU pump disconnected with no issues.  VSS with discharge and left in satisfactory condition with no s/s of distress noted.

## 2022-10-03 ENCOUNTER — Ambulatory Visit (HOSPITAL_BASED_OUTPATIENT_CLINIC_OR_DEPARTMENT_OTHER)
Admission: RE | Admit: 2022-10-03 | Discharge: 2022-10-03 | Disposition: A | Discharge status: Home / Self Care | Payer: Medicaid Other | Source: Ambulatory Visit | Attending: Hematology | Admitting: Hematology

## 2022-10-03 DIAGNOSIS — C189 Malignant neoplasm of colon, unspecified: Secondary | ICD-10-CM | POA: Diagnosis present

## 2022-10-03 MED ORDER — IOHEXOL 300 MG/ML  SOLN
100.0000 mL | Freq: Once | INTRAMUSCULAR | Status: AC | PRN
  Administered 2022-10-03: 85 mL via INTRAVENOUS

## 2022-10-04 NOTE — Progress Notes (Unsigned)
[facility]iology called with report on patients CT CAP done on 10/03/2022.  Impression:  1. Surgical change of prior colectomy with left anterior abdominal wall ostomy and Hartmann's pouch formation. 2. New mixed density 8.8 cm complex mass arising from the right adnexa with increased size of the nodule along the root of the sigmoid mesentery now measuring 17 x 15 mm with new small peritoneal/mesenteric nodules and trace abdominopelvic free fluid. 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 3. Nodularity in the left adnexa measures 3.0 x 2.1 cm previously 2.5 x 2.3 cm but otherwise similar over multiple remote prior imaging studies nonspecific possibly reflecting a broad ligament fibroid or ovary suggest continued attention on follow-up imaging 4. No evidence of metastatic disease in the chest.  [name], MD made aware. Patient is scheduled for appointment for follow up on 10/11/2022 at 0900. No further response at this time.

## 2022-10-05 ENCOUNTER — Encounter: Payer: Self-pay | Admitting: Hematology

## 2022-10-05 ENCOUNTER — Encounter (HOSPITAL_COMMUNITY): Payer: Self-pay | Admitting: Hematology
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
## 2022-10-11 ENCOUNTER — Inpatient Hospital Stay: Payer: Medicaid Other | Attending: Hematology

## 2022-10-11 ENCOUNTER — Inpatient Hospital Stay: Payer: Medicaid Other

## 2022-10-11 ENCOUNTER — Inpatient Hospital Stay (HOSPITAL_BASED_OUTPATIENT_CLINIC_OR_DEPARTMENT_OTHER): Payer: Medicaid Other | Admitting: Hematology

## 2022-10-11 DIAGNOSIS — D259 Leiomyoma of uterus, unspecified: Secondary | ICD-10-CM | POA: Insufficient documentation

## 2022-10-11 DIAGNOSIS — E876 Hypokalemia: Secondary | ICD-10-CM | POA: Insufficient documentation

## 2022-10-11 DIAGNOSIS — C786 Secondary malignant neoplasm of retroperitoneum and peritoneum: Secondary | ICD-10-CM | POA: Diagnosis not present

## 2022-10-11 DIAGNOSIS — C189 Malignant neoplasm of colon, unspecified: Secondary | ICD-10-CM | POA: Diagnosis not present

## 2022-10-11 DIAGNOSIS — D509 Iron deficiency anemia, unspecified: Secondary | ICD-10-CM | POA: Diagnosis not present

## 2022-10-11 DIAGNOSIS — R21 Rash and other nonspecific skin eruption: Secondary | ICD-10-CM | POA: Insufficient documentation

## 2022-10-11 DIAGNOSIS — Z933 Colostomy status: Secondary | ICD-10-CM | POA: Insufficient documentation

## 2022-10-11 DIAGNOSIS — C187 Malignant neoplasm of sigmoid colon: Secondary | ICD-10-CM | POA: Diagnosis not present

## 2022-10-11 DIAGNOSIS — Z5111 Encounter for antineoplastic chemotherapy: Secondary | ICD-10-CM | POA: Diagnosis present

## 2022-10-11 DIAGNOSIS — G629 Polyneuropathy, unspecified: Secondary | ICD-10-CM | POA: Insufficient documentation

## 2022-10-11 DIAGNOSIS — I10 Essential (primary) hypertension: Secondary | ICD-10-CM | POA: Insufficient documentation

## 2022-10-11 DIAGNOSIS — Z79899 Other long term (current) drug therapy: Secondary | ICD-10-CM | POA: Diagnosis not present

## 2022-10-11 DIAGNOSIS — Z9049 Acquired absence of other specified parts of digestive tract: Secondary | ICD-10-CM | POA: Insufficient documentation

## 2022-10-11 DIAGNOSIS — Z791 Long term (current) use of non-steroidal anti-inflammatories (NSAID): Secondary | ICD-10-CM | POA: Insufficient documentation

## 2022-10-11 DIAGNOSIS — R911 Solitary pulmonary nodule: Secondary | ICD-10-CM | POA: Diagnosis not present

## 2022-10-11 DIAGNOSIS — Z95828 Presence of other vascular implants and grafts: Secondary | ICD-10-CM | POA: Diagnosis not present

## 2022-10-11 DIAGNOSIS — Z793 Long term (current) use of hormonal contraceptives: Secondary | ICD-10-CM | POA: Diagnosis not present

## 2022-10-11 LAB — COMPREHENSIVE METABOLIC PANEL
ALT: 21 U/L (ref 0–44)
AST: 18 U/L (ref 15–41)
Albumin: 3.3 g/dL — ABNORMAL LOW (ref 3.5–5.0)
Alkaline Phosphatase: 57 U/L (ref 38–126)
Anion gap: 8 (ref 5–15)
BUN: 6 mg/dL (ref 6–20)
CO2: 21 mmol/L — ABNORMAL LOW (ref 22–32)
Calcium: 8.2 mg/dL — ABNORMAL LOW (ref 8.9–10.3)
Chloride: 107 mmol/L (ref 98–111)
Creatinine, Ser: 0.77 mg/dL (ref 0.44–1.00)
GFR, Estimated: >60 mL/min (ref >60)
Glucose, Bld: 110 mg/dL — ABNORMAL HIGH (ref 70–99)
Potassium: 3 mmol/L — ABNORMAL LOW (ref 3.5–5.1)
Sodium: 136 mmol/L (ref 135–145)
Total Bilirubin: 0.4 mg/dL (ref 0.3–1.2)
Total Protein: 6.2 g/dL — ABNORMAL LOW (ref 6.5–8.1)

## 2022-10-11 LAB — CBC WITH DIFFERENTIAL/PLATELET
Abs Immature Granulocytes: 0.02 10*3/uL (ref 0.00–0.07)
Basophils Absolute: 0 10*3/uL (ref 0.0–0.1)
Basophils Relative: 1 %
Eosinophils Absolute: 0.1 10*3/uL (ref 0.0–0.5)
Eosinophils Relative: 2 %
HCT: 32.9 % — ABNORMAL LOW (ref 36.0–46.0)
Hemoglobin: 10.8 g/dL — ABNORMAL LOW (ref 12.0–15.0)
Immature Granulocytes: 0 %
Lymphocytes Relative: 46 %
Lymphs Abs: 2.3 10*3/uL (ref 0.7–4.0)
MCH: 29.9 pg (ref 26.0–34.0)
MCHC: 32.8 g/dL (ref 30.0–36.0)
MCV: 91.1 fL (ref 80.0–100.0)
Monocytes Absolute: 0.5 10*3/uL (ref 0.1–1.0)
Monocytes Relative: 11 %
Neutro Abs: 1.9 10*3/uL (ref 1.7–7.7)
Neutrophils Relative %: 40 %
Platelets: 236 10*3/uL (ref 150–400)
RBC: 3.61 MIL/uL — ABNORMAL LOW (ref 3.87–5.11)
RDW: 19.1 % — ABNORMAL HIGH (ref 11.5–15.5)
WBC: 4.9 10*3/uL (ref 4.0–10.5)
nRBC: 0 % (ref 0.0–0.2)

## 2022-10-11 LAB — MAGNESIUM: Magnesium: 1.7 mg/dL (ref 1.7–2.4)

## 2022-10-11 MED ORDER — SODIUM CHLORIDE 0.9 % IV SOLN
6.0000 mg/kg | Freq: Once | INTRAVENOUS | Status: AC
  Administered 2022-10-11: 500 mg via INTRAVENOUS
  Filled 2022-10-11: qty 20

## 2022-10-11 MED ORDER — HEPARIN SOD (PORK) LOCK FLUSH 100 UNIT/ML IV SOLN: 500.0000 [IU] | Freq: Once | INTRAVENOUS | Status: DC | PRN

## 2022-10-11 MED ORDER — PALONOSETRON HCL INJECTION 0.25 MG/5ML
0.2500 mg | Freq: Once | INTRAVENOUS | Status: AC
  Administered 2022-10-11: 0.25 mg via INTRAVENOUS
  Filled 2022-10-11: qty 5

## 2022-10-11 MED ORDER — ATROPINE SULFATE 1 MG/ML IV SOLN
0.5000 mg | Freq: Once | INTRAVENOUS | Status: AC
  Administered 2022-10-11: 0.5 mg via INTRAVENOUS
  Filled 2022-10-11: qty 1

## 2022-10-11 MED ORDER — SODIUM CHLORIDE 0.9 % IV SOLN
2400.0000 mg/m2 | INTRAVENOUS | Status: DC
  Administered 2022-10-11: 4450 mg via INTRAVENOUS
  Filled 2022-10-11: qty 89

## 2022-10-11 MED ORDER — SODIUM CHLORIDE 0.9 % IV SOLN
200.0000 mg/m2 | Freq: Once | INTRAVENOUS | Status: AC
  Administered 2022-10-11: 370 mg via INTRAVENOUS
  Filled 2022-10-11 (×2): qty 18.5

## 2022-10-11 MED ORDER — FLUOROURACIL CHEMO INJECTION 2.5 GM/50ML
400.0000 mg/m2 | Freq: Once | INTRAVENOUS | Status: AC
  Administered 2022-10-11: 750 mg via INTRAVENOUS
  Filled 2022-10-11: qty 15

## 2022-10-11 MED ORDER — SODIUM CHLORIDE 0.9 % IV SOLN
165.0000 mg/m2 | Freq: Once | INTRAVENOUS | Status: AC
  Administered 2022-10-11: 300 mg via INTRAVENOUS
  Filled 2022-10-11: qty 15

## 2022-10-11 MED ORDER — SODIUM CHLORIDE 0.9 % IV SOLN: Freq: Once | INTRAVENOUS | Status: AC

## 2022-10-11 MED ORDER — SODIUM CHLORIDE 0.9% FLUSH
10.0000 mL | INTRAVENOUS | Status: AC
  Administered 2022-10-11: 10 mL

## 2022-10-11 MED ORDER — SODIUM CHLORIDE 0.9 % IV SOLN
150.0000 mg | Freq: Once | INTRAVENOUS | Status: AC
  Administered 2022-10-11: 150 mg via INTRAVENOUS
  Filled 2022-10-11: qty 150

## 2022-10-11 MED ORDER — SODIUM CHLORIDE 0.9% FLUSH: 10.0000 mL | INTRAVENOUS | Status: DC | PRN

## 2022-10-11 MED ORDER — POTASSIUM CHLORIDE CRYS ER 20 MEQ PO TBCR: 40.0000 meq | EXTENDED_RELEASE_TABLET | Freq: Once | ORAL | Status: DC

## 2022-10-11 MED ORDER — SODIUM CHLORIDE 0.9 % IV SOLN
10.0000 mg | Freq: Once | INTRAVENOUS | Status: AC
  Administered 2022-10-11: 10 mg via INTRAVENOUS
  Filled 2022-10-11: qty 10

## 2022-10-11 NOTE — Progress Notes (Signed)
Patient presents today for Vectibix/Folfiri with 5FU pump start per providers order.  Vital signs and labs reviewed by MD.  Message received from Chapman Moss RN/Dr. Ellin Saba patient okay for treatment. Patient receiving PO Potassium per providers standing order.  Treatment given today per MD orders.  Stable during infusion without adverse affects.  Vital signs stable.  5FU pump connected and verified RUN on the screen with the patient.  No complaints at this time.  Discharge from clinic ambulatory in stable condition.  Alert and oriented X 3.  Follow up with [facility] as scheduled.

## 2022-10-11 NOTE — Progress Notes (Signed)
Patient has been examined by Dr. Katragadda. Vital signs and labs have been reviewed by MD - ANC, Creatinine, LFTs, hemoglobin, and platelets are within treatment parameters per M.D. - pt may proceed with treatment.  Primary RN and pharmacy notified.  

## 2022-10-11 NOTE — Patient Instructions (Signed)
[facility]  Discharge Instructions: Thank you for choosing [facility] to provide your oncology and hematology care.  If you have a lab appointment with the Cancer Center - please note that after April 8th, 2024, all labs will be drawn in the cancer center.  You do not have to check in or register with the main entrance as you have in the past but will complete your check-in in the cancer center.  Wear comfortable clothing and clothing appropriate for easy access to any Portacath or PICC line.   We strive to give you quality time with your provider. You may need to reschedule your appointment if you arrive late (15 or more minutes).  Arriving late affects you and other patients whose appointments are after yours.  Also, if you miss three or more appointments without notifying the office, you may be dismissed from the clinic at the provider's discretion.      For prescription refill requests, have your pharmacy contact our office and allow 72 hours for refills to be completed.    Today you received the following chemotherapy and/or immunotherapy agents Folfiri/vectibix 5FU      To help prevent nausea and vomiting after your treatment, we encourage you to take your nausea medication as directed.  BELOW ARE SYMPTOMS THAT SHOULD BE REPORTED IMMEDIATELY: *FEVER GREATER THAN 100.4 F (38 C) OR HIGHER *CHILLS OR SWEATING *NAUSEA AND VOMITING THAT IS NOT CONTROLLED WITH YOUR NAUSEA MEDICATION *UNUSUAL SHORTNESS OF BREATH *UNUSUAL BRUISING OR BLEEDING *URINARY PROBLEMS (pain or burning when urinating, or frequent urination) *BOWEL PROBLEMS (unusual diarrhea, constipation, pain near the anus) TENDERNESS IN MOUTH AND THROAT WITH OR WITHOUT PRESENCE OF ULCERS (sore throat, sores in mouth, or a toothache) UNUSUAL RASH, SWELLING OR PAIN  UNUSUAL VAGINAL DISCHARGE OR ITCHING   Items with * indicate a potential emergency and should be followed up as soon as possible or  go to the Emergency Department if any problems should occur.  Please show the CHEMOTHERAPY ALERT CARD or IMMUNOTHERAPY ALERT CARD at check-in to the Emergency Department and triage nurse.  Should you have questions after your visit or need to cancel or reschedule your appointment, please contact [facility] CENTER AT [facility] [phone]  and follow the prompts.  Office hours are 8:00 a.m. to 4:30 p.m. Monday - Friday. Please note that voicemails left after 4:00 p.m. may not be returned until the following business day.  We are closed weekends and major holidays. You have access to a nurse at all times for urgent questions. Please call the main number to the clinic [phone] and follow the prompts.  For any non-urgent questions, you may also contact your provider using MyChart. We now offer e-Visits for anyone 64 and older to request care online for non-urgent symptoms. For details visit [email or web address].   Also download the MyChart app! Go to the app store, search "MyChart", open the app, select , and log in with your MyChart username and password.

## 2022-10-11 NOTE — Patient Instructions (Signed)
[facility] at [facility] Discharge Instructions   You were seen and examined today by Dr. Ellin Saba.  He reviewed the results of your lab work which are normal/stable.   He reviewed the results of your CT scan which shows the cancer is stable. It has not grown or spread.   We will proceed with your treatment today.   Return as scheduled.    Thank you for choosing [facility] at [facility] to provide your oncology and hematology care.  To afford each patient quality time with our provider, please arrive at least 15 minutes before your scheduled appointment time.   If you have a lab appointment with the Cancer Center please come in thru the Main Entrance and check in at the main information desk.  You need to re-schedule your appointment should you arrive 10 or more minutes late.  We strive to give you quality time with our providers, and arriving late affects you and other patients whose appointments are after yours.  Also, if you no show three or more times for appointments you may be dismissed from the clinic at the providers discretion.     Again, thank you for choosing [facility].  Our hope is that these requests will decrease the amount of time that you wait before being seen by our physicians.       _____________________________________________________________  Should you have questions after your visit to [facility], please contact our office at [phone] and follow the prompts.  Our office hours are 8:00 a.m. and 4:30 p.m. Monday - Friday.  Please note that voicemails left after 4:00 p.m. may not be returned until the following business day.  We are closed weekends and major holidays.  You do have access to a nurse 24-7, just call the main number to the clinic [phone] and do not press any options, hold on the line and a nurse will answer the phone.    For prescription refill requests, have your  pharmacy contact our office and allow 72 hours.    Due to Covid, you will need to wear a mask upon entering the hospital. If you do not have a mask, a mask will be given to you at the Main Entrance upon arrival. For doctor visits, patients may have 1 support person age 23 or older with them. For treatment visits, patients can not have anyone with them due to social distancing guidelines and our immunocompromised population.

## 2022-10-12 ENCOUNTER — Other Ambulatory Visit: Payer: Self-pay

## 2022-10-13 ENCOUNTER — Inpatient Hospital Stay: Payer: Medicaid Other

## 2022-10-13 VITALS — BP 136/87 | HR 70 | Temp 98.1°F | Resp 20

## 2022-10-13 DIAGNOSIS — C189 Malignant neoplasm of colon, unspecified: Secondary | ICD-10-CM

## 2022-10-13 DIAGNOSIS — Z5111 Encounter for antineoplastic chemotherapy: Secondary | ICD-10-CM | POA: Diagnosis not present

## 2022-10-13 DIAGNOSIS — Z95828 Presence of other vascular implants and grafts: Secondary | ICD-10-CM

## 2022-10-13 MED ORDER — HEPARIN SOD (PORK) LOCK FLUSH 100 UNIT/ML IV SOLN
500.0000 [IU] | Freq: Once | INTRAVENOUS | Status: AC | PRN
  Administered 2022-10-13: 500 [IU]

## 2022-10-13 MED ORDER — SODIUM CHLORIDE 0.9% FLUSH
10.0000 mL | INTRAVENOUS | Status: DC | PRN
  Administered 2022-10-13: 10 mL

## 2022-10-13 NOTE — Progress Notes (Signed)
Patients port flushed without difficulty.  Good blood return noted with no bruising or swelling noted at site.  Band aid applied.  VSS with discharge and left in satisfactory condition with no s/s of distress noted.   

## 2022-10-13 NOTE — Patient Instructions (Signed)

## 2022-10-22 ENCOUNTER — Other Ambulatory Visit: Payer: Self-pay

## 2022-10-25 ENCOUNTER — Inpatient Hospital Stay: Payer: Medicaid Other | Admitting: Hematology

## 2022-10-25 ENCOUNTER — Inpatient Hospital Stay: Payer: Medicaid Other

## 2022-10-25 VITALS — BP 139/75 | HR 78 | Temp 99.8°F | Resp 20

## 2022-10-25 DIAGNOSIS — C189 Malignant neoplasm of colon, unspecified: Secondary | ICD-10-CM

## 2022-10-25 DIAGNOSIS — Z95828 Presence of other vascular implants and grafts: Secondary | ICD-10-CM

## 2022-10-25 DIAGNOSIS — Z5111 Encounter for antineoplastic chemotherapy: Secondary | ICD-10-CM | POA: Diagnosis not present

## 2022-10-25 LAB — CBC WITH DIFFERENTIAL/PLATELET
Abs Immature Granulocytes: 0.02 10*3/uL (ref 0.00–0.07)
Basophils Absolute: 0 10*3/uL (ref 0.0–0.1)
Basophils Relative: 1 %
Eosinophils Absolute: 0.1 10*3/uL (ref 0.0–0.5)
Eosinophils Relative: 2 %
HCT: 33.1 % — ABNORMAL LOW (ref 36.0–46.0)
Hemoglobin: 11.2 g/dL — ABNORMAL LOW (ref 12.0–15.0)
Immature Granulocytes: 0 %
Lymphocytes Relative: 45 %
Lymphs Abs: 2.1 10*3/uL (ref 0.7–4.0)
MCH: 30.8 pg (ref 26.0–34.0)
MCHC: 33.8 g/dL (ref 30.0–36.0)
MCV: 90.9 fL (ref 80.0–100.0)
Monocytes Absolute: 0.5 10*3/uL (ref 0.1–1.0)
Monocytes Relative: 12 %
Neutro Abs: 1.8 10*3/uL (ref 1.7–7.7)
Neutrophils Relative %: 40 %
Platelets: 264 10*3/uL (ref 150–400)
RBC: 3.64 MIL/uL — ABNORMAL LOW (ref 3.87–5.11)
RDW: 18.7 % — ABNORMAL HIGH (ref 11.5–15.5)
WBC: 4.5 10*3/uL (ref 4.0–10.5)
nRBC: 0 % (ref 0.0–0.2)

## 2022-10-25 LAB — COMPREHENSIVE METABOLIC PANEL
ALT: 16 U/L (ref 0–44)
AST: 14 U/L — ABNORMAL LOW (ref 15–41)
Albumin: 3.4 g/dL — ABNORMAL LOW (ref 3.5–5.0)
Alkaline Phosphatase: 62 U/L (ref 38–126)
Anion gap: 9 (ref 5–15)
BUN: 8 mg/dL (ref 6–20)
CO2: 21 mmol/L — ABNORMAL LOW (ref 22–32)
Calcium: 8.2 mg/dL — ABNORMAL LOW (ref 8.9–10.3)
Chloride: 106 mmol/L (ref 98–111)
Creatinine, Ser: 0.72 mg/dL (ref 0.44–1.00)
GFR, Estimated: >60 mL/min (ref >60)
Glucose, Bld: 103 mg/dL — ABNORMAL HIGH (ref 70–99)
Potassium: 3 mmol/L — ABNORMAL LOW (ref 3.5–5.1)
Sodium: 136 mmol/L (ref 135–145)
Total Bilirubin: 0.5 mg/dL (ref 0.3–1.2)
Total Protein: 6.5 g/dL (ref 6.5–8.1)

## 2022-10-25 LAB — MAGNESIUM: Magnesium: 1.8 mg/dL (ref 1.7–2.4)

## 2022-10-25 MED ORDER — ATROPINE SULFATE 1 MG/ML IV SOLN
0.5000 mg | Freq: Once | INTRAVENOUS | Status: AC
  Administered 2022-10-25: 0.5 mg via INTRAVENOUS
  Filled 2022-10-25: qty 1

## 2022-10-25 MED ORDER — SODIUM CHLORIDE 0.9 % IV SOLN
165.0000 mg/m2 | Freq: Once | INTRAVENOUS | Status: AC
  Administered 2022-10-25: 300 mg via INTRAVENOUS
  Filled 2022-10-25: qty 15

## 2022-10-25 MED ORDER — PALONOSETRON HCL INJECTION 0.25 MG/5ML
0.2500 mg | Freq: Once | INTRAVENOUS | Status: AC
  Administered 2022-10-25: 0.25 mg via INTRAVENOUS
  Filled 2022-10-25: qty 5

## 2022-10-25 MED ORDER — SODIUM CHLORIDE 0.9 % IV SOLN
2400.0000 mg/m2 | INTRAVENOUS | Status: DC
  Administered 2022-10-25: 4450 mg via INTRAVENOUS
  Filled 2022-10-25: qty 89

## 2022-10-25 MED ORDER — SODIUM CHLORIDE 0.9 % IV SOLN
6.0000 mg/kg | Freq: Once | INTRAVENOUS | Status: AC
  Administered 2022-10-25: 500 mg via INTRAVENOUS
  Filled 2022-10-25: qty 20

## 2022-10-25 MED ORDER — FLUOROURACIL CHEMO INJECTION 2.5 GM/50ML
400.0000 mg/m2 | Freq: Once | INTRAVENOUS | Status: AC
  Administered 2022-10-25: 750 mg via INTRAVENOUS
  Filled 2022-10-25: qty 15

## 2022-10-25 MED ORDER — LEUCOVORIN CALCIUM INJECTION 350 MG
200.0000 mg/m2 | Freq: Once | INTRAVENOUS | Status: AC
  Administered 2022-10-25: 370 mg via INTRAVENOUS
  Filled 2022-10-25: qty 18.5

## 2022-10-25 MED ORDER — SODIUM CHLORIDE 0.9 % IV SOLN
150.0000 mg | Freq: Once | INTRAVENOUS | Status: AC
  Administered 2022-10-25: 150 mg via INTRAVENOUS
  Filled 2022-10-25: qty 150

## 2022-10-25 MED ORDER — SODIUM CHLORIDE 0.9 % IV SOLN
10.0000 mg | Freq: Once | INTRAVENOUS | Status: AC
  Administered 2022-10-25: 10 mg via INTRAVENOUS
  Filled 2022-10-25: qty 10

## 2022-10-25 MED ORDER — POTASSIUM CHLORIDE CRYS ER 20 MEQ PO TBCR: 40.0000 meq | EXTENDED_RELEASE_TABLET | Freq: Once | ORAL | Status: DC

## 2022-10-25 MED ORDER — SODIUM CHLORIDE 0.9 % IV SOLN: Freq: Once | INTRAVENOUS | Status: AC

## 2022-10-25 MED ORDER — SODIUM CHLORIDE 0.9% FLUSH
10.0000 mL | INTRAVENOUS | Status: DC | PRN
  Administered 2022-10-25: 10 mL via INTRAVENOUS

## 2022-10-25 NOTE — Patient Instructions (Signed)
[facility]  Discharge Instructions: Thank you for choosing [facility] to provide your oncology and hematology care.  If you have a lab appointment with the Cancer Center - please note that after April 8th, 2024, all labs will be drawn in the cancer center.  You do not have to check in or register with the main entrance as you have in the past but will complete your check-in in the cancer center.  Wear comfortable clothing and clothing appropriate for easy access to any Portacath or PICC line.   We strive to give you quality time with your provider. You may need to reschedule your appointment if you arrive late (15 or more minutes).  Arriving late affects you and other patients whose appointments are after yours.  Also, if you miss three or more appointments without notifying the office, you may be dismissed from the clinic at the provider's discretion.      For prescription refill requests, have your pharmacy contact our office and allow 72 hours for refills to be completed.    Today you received the following chemotherapy and/or immunotherapy agents Vectibix and Folfiri   To help prevent nausea and vomiting after your treatment, we encourage you to take your nausea medication as directed.  BELOW ARE SYMPTOMS THAT SHOULD BE REPORTED IMMEDIATELY: *FEVER GREATER THAN 100.4 F (38 C) OR HIGHER *CHILLS OR SWEATING *NAUSEA AND VOMITING THAT IS NOT CONTROLLED WITH YOUR NAUSEA MEDICATION *UNUSUAL SHORTNESS OF BREATH *UNUSUAL BRUISING OR BLEEDING *URINARY PROBLEMS (pain or burning when urinating, or frequent urination) *BOWEL PROBLEMS (unusual diarrhea, constipation, pain near the anus) TENDERNESS IN MOUTH AND THROAT WITH OR WITHOUT PRESENCE OF ULCERS (sore throat, sores in mouth, or a toothache) UNUSUAL RASH, SWELLING OR PAIN  UNUSUAL VAGINAL DISCHARGE OR ITCHING   Items with * indicate a potential emergency and should be followed up as soon as possible or go  to the Emergency Department if any problems should occur.  Please show the CHEMOTHERAPY ALERT CARD or IMMUNOTHERAPY ALERT CARD at check-in to the Emergency Department and triage nurse.  Should you have questions after your visit or need to cancel or reschedule your appointment, please contact [facility] CENTER AT [facility] [phone]  and follow the prompts.  Office hours are 8:00 a.m. to 4:30 p.m. Monday - Friday. Please note that voicemails left after 4:00 p.m. may not be returned until the following business day.  We are closed weekends and major holidays. You have access to a nurse at all times for urgent questions. Please call the main number to the clinic [phone] and follow the prompts.  For any non-urgent questions, you may also contact your provider using MyChart. We now offer e-Visits for anyone 33 and older to request care online for non-urgent symptoms. For details visit [email or web address].   Also download the MyChart app! Go to the app store, search "MyChart", open the app, select [place], and log in with your MyChart username and password.

## 2022-10-25 NOTE — Progress Notes (Signed)
Patients port flushed without difficulty.  Good blood return noted with no bruising or swelling noted at site.  Patient remains accessed for treatment.  

## 2022-10-25 NOTE — Progress Notes (Signed)
Patient presents today for Vecitibix/Folfiri infusion.  Patient is in satisfactory condition with no new complaints voiced.  Vital signs are stable.  Labs reviewed and all labs are within treatment parameters. Pt's potassium noted to be 3.0 today, pt took her own 40 mEq potassium chloride while in the clinic. We will proceed with treatment per MD orders.    Treatment given today per MD orders. Tolerated infusion without adverse affects. Vital signs stable. No complaints at this time. Discharged from clinic ambulatory in stable condition. Alert and oriented x 3. F/U with [facility] as scheduled. 5FU ambulatory pump infusing.

## 2022-10-27 ENCOUNTER — Inpatient Hospital Stay: Payer: Medicaid Other

## 2022-10-27 VITALS — BP 136/83 | HR 77 | Temp 99.1°F | Resp 18

## 2022-10-27 DIAGNOSIS — C189 Malignant neoplasm of colon, unspecified: Secondary | ICD-10-CM

## 2022-10-27 DIAGNOSIS — Z95828 Presence of other vascular implants and grafts: Secondary | ICD-10-CM

## 2022-10-27 MED ORDER — HEPARIN SOD (PORK) LOCK FLUSH 100 UNIT/ML IV SOLN: 500.0000 [IU] | Freq: Once | INTRAVENOUS | Status: DC | PRN

## 2022-10-27 MED ORDER — HEPARIN SOD (PORK) LOCK FLUSH 100 UNIT/ML IV SOLN: 500.0000 [IU] | Freq: Once | INTRAVENOUS | Status: DC

## 2022-10-27 MED ORDER — SODIUM CHLORIDE 0.9% FLUSH: 10.0000 mL | INTRAVENOUS | Status: DC | PRN

## 2022-10-27 MED ORDER — SODIUM CHLORIDE 0.9% FLUSH: 10.0000 mL | Freq: Once | INTRAVENOUS | Status: DC

## 2022-10-27 NOTE — Patient Instructions (Signed)
[facility]  Discharge Instructions: Thank you for choosing [facility] to provide your oncology and hematology care.  If you have a lab appointment with the Cancer Center - please note that after April 8th, 2024, all labs will be drawn in the cancer center.  You do not have to check in or register with the main entrance as you have in the past but will complete your check-in in the cancer center.  Wear comfortable clothing and clothing appropriate for easy access to any Portacath or PICC line.   We strive to give you quality time with your provider. You may need to reschedule your appointment if you arrive late (15 or more minutes).  Arriving late affects you and other patients whose appointments are after yours.  Also, if you miss three or more appointments without notifying the office, you may be dismissed from the clinic at the provider's discretion.      For prescription refill requests, have your pharmacy contact our office and allow 72 hours for refills to be completed.    Today you received the following chemotherapy and/or immunotherapy agents 5FU pump stop      To help prevent nausea and vomiting after your treatment, we encourage you to take your nausea medication as directed.  BELOW ARE SYMPTOMS THAT SHOULD BE REPORTED IMMEDIATELY: *FEVER GREATER THAN 100.4 F (38 C) OR HIGHER *CHILLS OR SWEATING *NAUSEA AND VOMITING THAT IS NOT CONTROLLED WITH YOUR NAUSEA MEDICATION *UNUSUAL SHORTNESS OF BREATH *UNUSUAL BRUISING OR BLEEDING *URINARY PROBLEMS (pain or burning when urinating, or frequent urination) *BOWEL PROBLEMS (unusual diarrhea, constipation, pain near the anus) TENDERNESS IN MOUTH AND THROAT WITH OR WITHOUT PRESENCE OF ULCERS (sore throat, sores in mouth, or a toothache) UNUSUAL RASH, SWELLING OR PAIN  UNUSUAL VAGINAL DISCHARGE OR ITCHING   Items with * indicate a potential emergency and should be followed up as soon as possible or go to  the Emergency Department if any problems should occur.  Please show the CHEMOTHERAPY ALERT CARD or IMMUNOTHERAPY ALERT CARD at check-in to the Emergency Department and triage nurse.  Should you have questions after your visit or need to cancel or reschedule your appointment, please contact [facility] CENTER AT [facility] [phone]  and follow the prompts.  Office hours are 8:00 a.m. to 4:30 p.m. Monday - Friday. Please note that voicemails left after 4:00 p.m. may not be returned until the following business day.  We are closed weekends and major holidays. You have access to a nurse at all times for urgent questions. Please call the main number to the clinic [phone] and follow the prompts.  For any non-urgent questions, you may also contact your provider using MyChart. We now offer e-Visits for anyone 45 and older to request care online for non-urgent symptoms. For details visit [email or web address].   Also download the MyChart app! Go to the app store, search "MyChart", open the app, select [place], and log in with your MyChart username and password.

## 2022-10-27 NOTE — Progress Notes (Signed)
Patient presents today for 5FU pump stop and disconnection after 46 hour continous infusion.   5FU pump deaccessed.  Patients port flushed without difficulty.  Good blood return noted with no bruising or swelling noted at site.  needle removed intact.  Band aid applied.  VSS with discharge and left in satisfactory condition via wheelchair with no s/s of distress noted.

## 2022-11-05 ENCOUNTER — Other Ambulatory Visit: Payer: Self-pay

## 2022-11-08 ENCOUNTER — Inpatient Hospital Stay: Payer: Medicaid Other

## 2022-11-08 ENCOUNTER — Inpatient Hospital Stay: Payer: Medicaid Other | Attending: Hematology

## 2022-11-08 DIAGNOSIS — Z79899 Other long term (current) drug therapy: Secondary | ICD-10-CM | POA: Diagnosis not present

## 2022-11-08 DIAGNOSIS — I10 Essential (primary) hypertension: Secondary | ICD-10-CM | POA: Insufficient documentation

## 2022-11-08 DIAGNOSIS — G629 Polyneuropathy, unspecified: Secondary | ICD-10-CM | POA: Insufficient documentation

## 2022-11-08 DIAGNOSIS — Z95828 Presence of other vascular implants and grafts: Secondary | ICD-10-CM

## 2022-11-08 DIAGNOSIS — Z793 Long term (current) use of hormonal contraceptives: Secondary | ICD-10-CM | POA: Insufficient documentation

## 2022-11-08 DIAGNOSIS — E876 Hypokalemia: Secondary | ICD-10-CM | POA: Insufficient documentation

## 2022-11-08 DIAGNOSIS — C187 Malignant neoplasm of sigmoid colon: Secondary | ICD-10-CM | POA: Insufficient documentation

## 2022-11-08 DIAGNOSIS — Z5111 Encounter for antineoplastic chemotherapy: Secondary | ICD-10-CM | POA: Insufficient documentation

## 2022-11-08 DIAGNOSIS — R21 Rash and other nonspecific skin eruption: Secondary | ICD-10-CM | POA: Diagnosis not present

## 2022-11-08 DIAGNOSIS — C189 Malignant neoplasm of colon, unspecified: Secondary | ICD-10-CM

## 2022-11-08 DIAGNOSIS — Z791 Long term (current) use of non-steroidal anti-inflammatories (NSAID): Secondary | ICD-10-CM | POA: Insufficient documentation

## 2022-11-08 DIAGNOSIS — D509 Iron deficiency anemia, unspecified: Secondary | ICD-10-CM | POA: Insufficient documentation

## 2022-11-08 LAB — CBC WITH DIFFERENTIAL/PLATELET
Abs Immature Granulocytes: 0.03 10*3/uL (ref 0.00–0.07)
Basophils Absolute: 0 10*3/uL (ref 0.0–0.1)
Basophils Relative: 1 %
Eosinophils Absolute: 0.1 10*3/uL (ref 0.0–0.5)
Eosinophils Relative: 2 %
HCT: 33.9 % — ABNORMAL LOW (ref 36.0–46.0)
Hemoglobin: 11.2 g/dL — ABNORMAL LOW (ref 12.0–15.0)
Immature Granulocytes: 1 %
Lymphocytes Relative: 40 %
Lymphs Abs: 2 10*3/uL (ref 0.7–4.0)
MCH: 30.6 pg (ref 26.0–34.0)
MCHC: 33 g/dL (ref 30.0–36.0)
MCV: 92.6 fL (ref 80.0–100.0)
Monocytes Absolute: 0.7 10*3/uL (ref 0.1–1.0)
Monocytes Relative: 14 %
Neutro Abs: 2.2 10*3/uL (ref 1.7–7.7)
Neutrophils Relative %: 42 %
Platelets: 227 10*3/uL (ref 150–400)
RBC: 3.66 MIL/uL — ABNORMAL LOW (ref 3.87–5.11)
RDW: 18.1 % — ABNORMAL HIGH (ref 11.5–15.5)
WBC: 5.1 10*3/uL (ref 4.0–10.5)
nRBC: 0 % (ref 0.0–0.2)

## 2022-11-08 LAB — COMPREHENSIVE METABOLIC PANEL
ALT: 18 U/L (ref 0–44)
AST: 17 U/L (ref 15–41)
Albumin: 3.4 g/dL — ABNORMAL LOW (ref 3.5–5.0)
Alkaline Phosphatase: 60 U/L (ref 38–126)
Anion gap: 9 (ref 5–15)
BUN: 7 mg/dL (ref 6–20)
CO2: 21 mmol/L — ABNORMAL LOW (ref 22–32)
Calcium: 8.3 mg/dL — ABNORMAL LOW (ref 8.9–10.3)
Chloride: 107 mmol/L (ref 98–111)
Creatinine, Ser: 0.68 mg/dL (ref 0.44–1.00)
GFR, Estimated: >60 mL/min (ref >60)
Glucose, Bld: 104 mg/dL — ABNORMAL HIGH (ref 70–99)
Potassium: 3.3 mmol/L — ABNORMAL LOW (ref 3.5–5.1)
Sodium: 137 mmol/L (ref 135–145)
Total Bilirubin: 0.3 mg/dL (ref 0.3–1.2)
Total Protein: 6.3 g/dL — ABNORMAL LOW (ref 6.5–8.1)

## 2022-11-08 LAB — MAGNESIUM: Magnesium: 1.8 mg/dL (ref 1.7–2.4)

## 2022-11-08 MED ORDER — SODIUM CHLORIDE 0.9 % IV SOLN
165.0000 mg/m2 | Freq: Once | INTRAVENOUS | Status: AC
  Administered 2022-11-08: 300 mg via INTRAVENOUS
  Filled 2022-11-08: qty 15

## 2022-11-08 MED ORDER — PALONOSETRON HCL INJECTION 0.25 MG/5ML
0.2500 mg | Freq: Once | INTRAVENOUS | Status: AC
  Administered 2022-11-08: 0.25 mg via INTRAVENOUS
  Filled 2022-11-08: qty 5

## 2022-11-08 MED ORDER — FLUOROURACIL CHEMO INJECTION 2.5 GM/50ML
400.0000 mg/m2 | Freq: Once | INTRAVENOUS | Status: AC
  Administered 2022-11-08: 750 mg via INTRAVENOUS
  Filled 2022-11-08: qty 15

## 2022-11-08 MED ORDER — ATROPINE SULFATE 1 MG/ML IV SOLN
0.5000 mg | Freq: Once | INTRAVENOUS | Status: AC
  Administered 2022-11-08: 0.5 mg via INTRAVENOUS
  Filled 2022-11-08: qty 1

## 2022-11-08 MED ORDER — SODIUM CHLORIDE 0.9 % IV SOLN
150.0000 mg | Freq: Once | INTRAVENOUS | Status: AC
  Administered 2022-11-08: 150 mg via INTRAVENOUS
  Filled 2022-11-08: qty 150

## 2022-11-08 MED ORDER — SODIUM CHLORIDE 0.9 % IV SOLN
200.0000 mg/m2 | Freq: Once | INTRAVENOUS | Status: AC
  Administered 2022-11-08: 370 mg via INTRAVENOUS
  Filled 2022-11-08: qty 18.5

## 2022-11-08 MED ORDER — SODIUM CHLORIDE 0.9% FLUSH: 10.0000 mL | INTRAVENOUS | Status: DC | PRN

## 2022-11-08 MED ORDER — SODIUM CHLORIDE 0.9 % IV SOLN: Freq: Once | INTRAVENOUS | Status: AC

## 2022-11-08 MED ORDER — SODIUM CHLORIDE 0.9% FLUSH
10.0000 mL | INTRAVENOUS | Status: DC | PRN
  Administered 2022-11-08: 10 mL via INTRAVENOUS

## 2022-11-08 MED ORDER — SODIUM CHLORIDE 0.9 % IV SOLN
6.0000 mg/kg | Freq: Once | INTRAVENOUS | Status: AC
  Administered 2022-11-08: 500 mg via INTRAVENOUS
  Filled 2022-11-08: qty 20

## 2022-11-08 MED ORDER — HEPARIN SOD (PORK) LOCK FLUSH 100 UNIT/ML IV SOLN: 500.0000 [IU] | Freq: Once | INTRAVENOUS | Status: DC | PRN

## 2022-11-08 MED ORDER — SODIUM CHLORIDE 0.9 % IV SOLN
2400.0000 mg/m2 | INTRAVENOUS | Status: DC
  Administered 2022-11-08: 4450 mg via INTRAVENOUS
  Filled 2022-11-08: qty 89

## 2022-11-08 MED ORDER — SODIUM CHLORIDE 0.9 % IV SOLN
10.0000 mg | Freq: Once | INTRAVENOUS | Status: AC
  Administered 2022-11-08: 10 mg via INTRAVENOUS
  Filled 2022-11-08: qty 10

## 2022-11-08 NOTE — Progress Notes (Signed)
Patient presents today for Vectibix/Folfiri with 5FU pump start per providers order.  Vital signs and labs within parameters for treatment.  Patient has no new complaints at this time.    Treatment given today per MD orders.  Tolerated infusion without adverse affects.  Vital signs stable.  5FU pump start and verified RUN on the screen with the patient.  No complaints at this time.  Discharge from clinic ambulatory in stable condition.  Alert and oriented X 3.  Follow up with [facility] as scheduled.

## 2022-11-08 NOTE — Patient Instructions (Signed)
[facility]  Discharge Instructions: Thank you for choosing [facility] to provide your oncology and hematology care.  If you have a lab appointment with the Cancer Center - please note that after April 8th, 2024, all labs will be drawn in the cancer center.  You do not have to check in or register with the main entrance as you have in the past but will complete your check-in in the cancer center.  Wear comfortable clothing and clothing appropriate for easy access to any Portacath or PICC line.   We strive to give you quality time with your provider. You may need to reschedule your appointment if you arrive late (15 or more minutes).  Arriving late affects you and other patients whose appointments are after yours.  Also, if you miss three or more appointments without notifying the office, you may be dismissed from the clinic at the provider's discretion.      For prescription refill requests, have your pharmacy contact our office and allow 72 hours for refills to be completed.    Today you received the following chemotherapy and/or immunotherapy agents Folfiri Vectibix      To help prevent nausea and vomiting after your treatment, we encourage you to take your nausea medication as directed.  BELOW ARE SYMPTOMS THAT SHOULD BE REPORTED IMMEDIATELY: *FEVER GREATER THAN 100.4 F (38 C) OR HIGHER *CHILLS OR SWEATING *NAUSEA AND VOMITING THAT IS NOT CONTROLLED WITH YOUR NAUSEA MEDICATION *UNUSUAL SHORTNESS OF BREATH *UNUSUAL BRUISING OR BLEEDING *URINARY PROBLEMS (pain or burning when urinating, or frequent urination) *BOWEL PROBLEMS (unusual diarrhea, constipation, pain near the anus) TENDERNESS IN MOUTH AND THROAT WITH OR WITHOUT PRESENCE OF ULCERS (sore throat, sores in mouth, or a toothache) UNUSUAL RASH, SWELLING OR PAIN  UNUSUAL VAGINAL DISCHARGE OR ITCHING   Items with * indicate a potential emergency and should be followed up as soon as possible or go  to the Emergency Department if any problems should occur.  Please show the CHEMOTHERAPY ALERT CARD or IMMUNOTHERAPY ALERT CARD at check-in to the Emergency Department and triage nurse.  Should you have questions after your visit or need to cancel or reschedule your appointment, please contact [facility] CENTER AT [facility] [phone]  and follow the prompts.  Office hours are 8:00 a.m. to 4:30 p.m. Monday - Friday. Please note that voicemails left after 4:00 p.m. may not be returned until the following business day.  We are closed weekends and major holidays. You have access to a nurse at all times for urgent questions. Please call the main number to the clinic [phone] and follow the prompts.  For any non-urgent questions, you may also contact your provider using MyChart. We now offer e-Visits for anyone 44 and older to request care online for non-urgent symptoms. For details visit [email or web address].   Also download the MyChart app! Go to the app store, search "MyChart", open the app, select [place], and log in with your MyChart username and password.

## 2022-11-10 ENCOUNTER — Other Ambulatory Visit (HOSPITAL_COMMUNITY): Payer: Self-pay | Admitting: Hematology

## 2022-11-10 ENCOUNTER — Inpatient Hospital Stay: Payer: Medicaid Other

## 2022-11-10 VITALS — BP 128/82 | HR 63 | Temp 98.1°F | Resp 16

## 2022-11-10 DIAGNOSIS — Z5111 Encounter for antineoplastic chemotherapy: Secondary | ICD-10-CM | POA: Diagnosis not present

## 2022-11-10 DIAGNOSIS — Z95828 Presence of other vascular implants and grafts: Secondary | ICD-10-CM

## 2022-11-10 DIAGNOSIS — C189 Malignant neoplasm of colon, unspecified: Secondary | ICD-10-CM

## 2022-11-10 MED ORDER — HEPARIN SOD (PORK) LOCK FLUSH 100 UNIT/ML IV SOLN
500.0000 [IU] | Freq: Once | INTRAVENOUS | Status: AC | PRN
  Administered 2022-11-10: 500 [IU]

## 2022-11-10 MED ORDER — SODIUM CHLORIDE 0.9% FLUSH
10.0000 mL | INTRAVENOUS | Status: DC | PRN
  Administered 2022-11-10: 10 mL

## 2022-11-10 NOTE — Progress Notes (Signed)
Patient presents today for pump d/c per MD orders. She denies any side effects other than some abdominal cramping on Tuesday after she got home. She took Tylenol to help with the pain.  She said that it was on her right side.  She denies any changes in her stool output.  She denies any fevers.  She reports having no issues with the pump over the last 46 hours.  Her pump states that entire volume has been infused and has zero remaining.  It has been turned off.  Pump d/c and port deaccessed without difficulty.  Patient discharged ambulatory and in stable condition from clinic.  She is aware of her upcoming appts.    I have sent a message to Dr. Ellin Saba about her cramping. Dr. Ellin Saba wants patient to increase her pre-med Atropine to 1mg .  IT trainer for infusion aware.   I have let the patient know that we will try to prevent these cramping side effects with these pre-meds during her next treatment.   No further orders.

## 2022-11-10 NOTE — Patient Instructions (Signed)
[facility]  Discharge Instructions: Thank you for choosing [facility] to provide your oncology and hematology care.  If you have a lab appointment with the Cancer Center - please note that after April 8th, 2024, all labs will be drawn in the cancer center.  You do not have to check in or register with the main entrance as you have in the past but will complete your check-in in the cancer center.  Wear comfortable clothing and clothing appropriate for easy access to any Portacath or PICC line.   We strive to give you quality time with your provider. You may need to reschedule your appointment if you arrive late (15 or more minutes).  Arriving late affects you and other patients whose appointments are after yours.  Also, if you miss three or more appointments without notifying the office, you may be dismissed from the clinic at the provider's discretion.      For prescription refill requests, have your pharmacy contact our office and allow 72 hours for refills to be completed.    Today you had your pump disconnected and port deaccessed.    To help prevent nausea and vomiting after your treatment, we encourage you to take your nausea medication as directed.  BELOW ARE SYMPTOMS THAT SHOULD BE REPORTED IMMEDIATELY: *FEVER GREATER THAN 100.4 F (38 C) OR HIGHER *CHILLS OR SWEATING *NAUSEA AND VOMITING THAT IS NOT CONTROLLED WITH YOUR NAUSEA MEDICATION *UNUSUAL SHORTNESS OF BREATH *UNUSUAL BRUISING OR BLEEDING *URINARY PROBLEMS (pain or burning when urinating, or frequent urination) *BOWEL PROBLEMS (unusual diarrhea, constipation, pain near the anus) TENDERNESS IN MOUTH AND THROAT WITH OR WITHOUT PRESENCE OF ULCERS (sore throat, sores in mouth, or a toothache) UNUSUAL RASH, SWELLING OR PAIN  UNUSUAL VAGINAL DISCHARGE OR ITCHING   Items with * indicate a potential emergency and should be followed up as soon as possible or go to the Emergency Department if any  problems should occur.  Please show the CHEMOTHERAPY ALERT CARD or IMMUNOTHERAPY ALERT CARD at check-in to the Emergency Department and triage nurse.  Should you have questions after your visit or need to cancel or reschedule your appointment, please contact [facility] CENTER AT [facility] [phone]  and follow the prompts.  Office hours are 8:00 a.m. to 4:30 p.m. Monday - Friday. Please note that voicemails left after 4:00 p.m. may not be returned until the following business day.  We are closed weekends and major holidays. You have access to a nurse at all times for urgent questions. Please call the main number to the clinic [phone] and follow the prompts.  For any non-urgent questions, you may also contact your provider using MyChart. We now offer e-Visits for anyone 48 and older to request care online for non-urgent symptoms. For details visit [email or web address].   Also download the MyChart app! Go to the app store, search "MyChart", open the app, select [place], and log in with your MyChart username and password.

## 2022-11-10 NOTE — Progress Notes (Signed)
Received orders to increase atropine to 1 mg IV with each irinotecan infusion.  Orders updated.  T.O. Dr Carilyn Goodpasture, PharmD 11/10/22 @ 1415

## 2022-11-21 ENCOUNTER — Encounter: Payer: Self-pay | Admitting: Hematology

## 2022-11-21 ENCOUNTER — Other Ambulatory Visit: Payer: Self-pay | Admitting: *Deleted

## 2022-11-21 DIAGNOSIS — C189 Malignant neoplasm of colon, unspecified: Secondary | ICD-10-CM

## 2022-11-21 DIAGNOSIS — K1379 Other lesions of oral mucosa: Secondary | ICD-10-CM

## 2022-11-21 MED ORDER — SUCRALFATE 1 GM/10ML PO SUSP: 1.0000 g | Freq: Four times a day (QID) | ORAL | 6 refills | Status: DC | PRN
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
## 2022-11-22 ENCOUNTER — Other Ambulatory Visit: Payer: Self-pay

## 2022-11-22 ENCOUNTER — Inpatient Hospital Stay (HOSPITAL_BASED_OUTPATIENT_CLINIC_OR_DEPARTMENT_OTHER): Payer: Medicaid Other | Admitting: Hematology

## 2022-11-22 ENCOUNTER — Inpatient Hospital Stay: Payer: Medicaid Other

## 2022-11-22 VITALS — BP 134/62 | HR 79 | Temp 97.2°F | Resp 20

## 2022-11-22 DIAGNOSIS — Z5111 Encounter for antineoplastic chemotherapy: Secondary | ICD-10-CM | POA: Diagnosis not present

## 2022-11-22 DIAGNOSIS — Z95828 Presence of other vascular implants and grafts: Secondary | ICD-10-CM | POA: Diagnosis not present

## 2022-11-22 DIAGNOSIS — C189 Malignant neoplasm of colon, unspecified: Secondary | ICD-10-CM

## 2022-11-22 LAB — COMPREHENSIVE METABOLIC PANEL
ALT: 15 U/L (ref 0–44)
AST: 15 U/L (ref 15–41)
Albumin: 3.3 g/dL — ABNORMAL LOW (ref 3.5–5.0)
Alkaline Phosphatase: 59 U/L (ref 38–126)
Anion gap: 7 (ref 5–15)
BUN: 7 mg/dL (ref 6–20)
CO2: 22 mmol/L (ref 22–32)
Calcium: 7.9 mg/dL — ABNORMAL LOW (ref 8.9–10.3)
Chloride: 105 mmol/L (ref 98–111)
Creatinine, Ser: 0.74 mg/dL (ref 0.44–1.00)
GFR, Estimated: >60 mL/min (ref >60)
Glucose, Bld: 98 mg/dL (ref 70–99)
Potassium: 2.9 mmol/L — ABNORMAL LOW (ref 3.5–5.1)
Sodium: 134 mmol/L — ABNORMAL LOW (ref 135–145)
Total Bilirubin: 0.3 mg/dL (ref 0.3–1.2)
Total Protein: 6.2 g/dL — ABNORMAL LOW (ref 6.5–8.1)

## 2022-11-22 LAB — CBC WITH DIFFERENTIAL/PLATELET
Abs Immature Granulocytes: 0.02 10*3/uL (ref 0.00–0.07)
Basophils Absolute: 0 10*3/uL (ref 0.0–0.1)
Basophils Relative: 1 %
Eosinophils Absolute: 0.1 10*3/uL (ref 0.0–0.5)
Eosinophils Relative: 3 %
HCT: 33.1 % — ABNORMAL LOW (ref 36.0–46.0)
Hemoglobin: 11.2 g/dL — ABNORMAL LOW (ref 12.0–15.0)
Immature Granulocytes: 0 %
Lymphocytes Relative: 36 %
Lymphs Abs: 1.9 10*3/uL (ref 0.7–4.0)
MCH: 31 pg (ref 26.0–34.0)
MCHC: 33.8 g/dL (ref 30.0–36.0)
MCV: 91.7 fL (ref 80.0–100.0)
Monocytes Absolute: 0.7 10*3/uL (ref 0.1–1.0)
Monocytes Relative: 13 %
Neutro Abs: 2.5 10*3/uL (ref 1.7–7.7)
Neutrophils Relative %: 47 %
Platelets: 246 10*3/uL (ref 150–400)
RBC: 3.61 MIL/uL — ABNORMAL LOW (ref 3.87–5.11)
RDW: 17.2 % — ABNORMAL HIGH (ref 11.5–15.5)
WBC: 5.3 10*3/uL (ref 4.0–10.5)
nRBC: 0 % (ref 0.0–0.2)

## 2022-11-22 LAB — MAGNESIUM: Magnesium: 1.7 mg/dL (ref 1.7–2.4)

## 2022-11-22 MED ORDER — SODIUM CHLORIDE 0.9 % IV SOLN
6.0000 mg/kg | Freq: Once | INTRAVENOUS | Status: AC
  Administered 2022-11-22: 500 mg via INTRAVENOUS
  Filled 2022-11-22: qty 20

## 2022-11-22 MED ORDER — SODIUM CHLORIDE 0.9 % IV SOLN
150.0000 mg | Freq: Once | INTRAVENOUS | Status: AC
  Administered 2022-11-22: 150 mg via INTRAVENOUS
  Filled 2022-11-22: qty 150

## 2022-11-22 MED ORDER — SODIUM CHLORIDE 0.9 % IV SOLN
2400.0000 mg/m2 | INTRAVENOUS | Status: DC
  Administered 2022-11-22: 4450 mg via INTRAVENOUS
  Filled 2022-11-22: qty 89

## 2022-11-22 MED ORDER — HEPARIN SOD (PORK) LOCK FLUSH 100 UNIT/ML IV SOLN: 500.0000 [IU] | Freq: Once | INTRAVENOUS | Status: DC | PRN

## 2022-11-22 MED ORDER — SODIUM CHLORIDE 0.9 % IV SOLN
10.0000 mg | Freq: Once | INTRAVENOUS | Status: AC
  Administered 2022-11-22: 10 mg via INTRAVENOUS
  Filled 2022-11-22: qty 10

## 2022-11-22 MED ORDER — SODIUM CHLORIDE 0.9% FLUSH
10.0000 mL | INTRAVENOUS | Status: DC | PRN
  Administered 2022-11-22: 10 mL via INTRAVENOUS

## 2022-11-22 MED ORDER — SODIUM CHLORIDE 0.9 % IV SOLN
165.0000 mg/m2 | Freq: Once | INTRAVENOUS | Status: AC
  Administered 2022-11-22: 300 mg via INTRAVENOUS
  Filled 2022-11-22: qty 15

## 2022-11-22 MED ORDER — SODIUM CHLORIDE 0.9 % IV SOLN: Freq: Once | INTRAVENOUS | Status: AC

## 2022-11-22 MED ORDER — LEUCOVORIN CALCIUM INJECTION 350 MG
200.0000 mg/m2 | Freq: Once | INTRAVENOUS | Status: AC
  Administered 2022-11-22: 370 mg via INTRAVENOUS
  Filled 2022-11-22: qty 18.5

## 2022-11-22 MED ORDER — FLUOROURACIL CHEMO INJECTION 2.5 GM/50ML
400.0000 mg/m2 | Freq: Once | INTRAVENOUS | Status: AC
  Administered 2022-11-22: 750 mg via INTRAVENOUS
  Filled 2022-11-22: qty 15

## 2022-11-22 MED ORDER — PALONOSETRON HCL INJECTION 0.25 MG/5ML
0.2500 mg | Freq: Once | INTRAVENOUS | Status: AC
  Administered 2022-11-22: 0.25 mg via INTRAVENOUS
  Filled 2022-11-22: qty 5

## 2022-11-22 MED ORDER — ATROPINE SULFATE 1 MG/ML IV SOLN
1.0000 mg | Freq: Once | INTRAVENOUS | Status: AC
  Administered 2022-11-22: 1 mg via INTRAVENOUS
  Filled 2022-11-22: qty 1

## 2022-11-22 MED ORDER — POTASSIUM CHLORIDE CRYS ER 20 MEQ PO TBCR: 40.0000 meq | EXTENDED_RELEASE_TABLET | Freq: Once | ORAL | Status: DC

## 2022-11-22 NOTE — Patient Instructions (Signed)
[facility]  Discharge Instructions: Thank you for choosing [facility] to provide your oncology and hematology care.  If you have a lab appointment with the Cancer Center - please note that after April 8th, 2024, all labs will be drawn in the cancer center.  You do not have to check in or register with the main entrance as you have in the past but will complete your check-in in the cancer center.  Wear comfortable clothing and clothing appropriate for easy access to any Portacath or PICC line.   We strive to give you quality time with your provider. You may need to reschedule your appointment if you arrive late (15 or more minutes).  Arriving late affects you and other patients whose appointments are after yours.  Also, if you miss three or more appointments without notifying the office, you may be dismissed from the clinic at the provider's discretion.      For prescription refill requests, have your pharmacy contact our office and allow 72 hours for refills to be completed.    Today you received the following chemotherapy and/or immunotherapy agents vectibix, irinotecan, leucovorin, adrucil   To help prevent nausea and vomiting after your treatment, we encourage you to take your nausea medication as directed.  BELOW ARE SYMPTOMS THAT SHOULD BE REPORTED IMMEDIATELY: *FEVER GREATER THAN 100.4 F (38 C) OR HIGHER *CHILLS OR SWEATING *NAUSEA AND VOMITING THAT IS NOT CONTROLLED WITH YOUR NAUSEA MEDICATION *UNUSUAL SHORTNESS OF BREATH *UNUSUAL BRUISING OR BLEEDING *URINARY PROBLEMS (pain or burning when urinating, or frequent urination) *BOWEL PROBLEMS (unusual diarrhea, constipation, pain near the anus) TENDERNESS IN MOUTH AND THROAT WITH OR WITHOUT PRESENCE OF ULCERS (sore throat, sores in mouth, or a toothache) UNUSUAL RASH, SWELLING OR PAIN  UNUSUAL VAGINAL DISCHARGE OR ITCHING   Items with * indicate a potential emergency and should be followed up as  soon as possible or go to the Emergency Department if any problems should occur.  Please show the CHEMOTHERAPY ALERT CARD or IMMUNOTHERAPY ALERT CARD at check-in to the Emergency Department and triage nurse.  Should you have questions after your visit or need to cancel or reschedule your appointment, please contact [facility] [phone]  and follow the prompts.  Office hours are 8:00 a.m. to 4:30 p.m. Monday - Friday. Please note that voicemails left after 4:00 p.m. may not be returned until the following business day.  We are closed weekends and major holidays. You have access to a nurse at all times for urgent questions. Please call the main number to the clinic [phone] and follow the prompts.  For any non-urgent questions, you may also contact your provider using MyChart. We now offer e-Visits for anyone 31 and older to request care online for non-urgent symptoms. For details visit [email or web address].   Also download the MyChart app! Go to the app store, search "MyChart", open the app, select [facility], and log in with your MyChart username and password.

## 2022-11-22 NOTE — Patient Instructions (Signed)
[facility]  Discharge Instructions  You were seen and examined today by Dr. Ellin Saba.  Dr. Ellin Saba discussed your most recent lab work which is stable.  We will continue the chemotherapy every 2 weeks.  Follow-up as scheduled.  Thank you for choosing [facility] to provide your oncology and hematology care.   To afford each patient quality time with our provider, please arrive at least 15 minutes before your scheduled appointment time. You may need to reschedule your appointment if you arrive late (10 or more minutes). Arriving late affects you and other patients whose appointments are after yours.  Also, if you miss three or more appointments without notifying the office, you may be dismissed from the clinic at the provider's discretion.    Again, thank you for choosing [facility].  Our hope is that these requests will decrease the amount of time that you wait before being seen by our physicians.   If you have a lab appointment with the Cancer Center - please note that after April 8th, all labs will be drawn in the cancer center.  You do not have to check in or register with the main entrance as you have in the past but will complete your check-in at the cancer center.            _____________________________________________________________  Should you have questions after your visit to [facility], please contact our office at [phone] and follow the prompts.  Our office hours are 8:00 a.m. to 4:30 p.m. Monday - Thursday and 8:00 a.m. to 2:30 p.m. Friday.  Please note that voicemails left after 4:00 p.m. may not be returned until the following business day.  We are closed weekends and all major holidays.  You do have access to a nurse 24-7, just call the main number to the clinic [phone] and do not press any options, hold on the line and a nurse will answer the phone.    For prescription refill  requests, have your pharmacy contact our office and allow 72 hours.    Masks are no longer required in the cancer centers. If you would like for your care team to wear a mask while they are taking care of you, please let them know. You may have one support person who is at least 52 years old accompany you for your appointments.

## 2022-11-22 NOTE — Progress Notes (Signed)
Patient tolerated chemotherapy with no complaints voiced.  Side effects with management reviewed with understanding verbalized.  Port dressing clean, dry, and intact. Good blood return noted before and after administration of chemotherapy. Pt discharged with chemotherapy pump and due to return to clinic on 11/24/22 for pump stop. Patient left in satisfactory condition with VSS and no s/s of distress noted.  All follow ups as scheduled.  Savan Ruta [employer]

## 2022-11-22 NOTE — Progress Notes (Signed)
Patient has been assessed, vital signs and labs have been reviewed by Dr. Ellin Saba. ANC, Creatinine, LFTs, and Platelets are within treatment parameters per Dr. Ellin Saba. The patient is good to proceed with treatment at this time. Patient is to receive po potassium while in the clinic today. Primary RN and pharmacy aware.

## 2022-11-23 ENCOUNTER — Other Ambulatory Visit: Payer: Self-pay

## 2022-11-24 ENCOUNTER — Inpatient Hospital Stay: Payer: Medicaid Other

## 2022-11-24 VITALS — BP 134/84 | HR 70 | Temp 96.8°F | Resp 18

## 2022-11-24 DIAGNOSIS — Z95828 Presence of other vascular implants and grafts: Secondary | ICD-10-CM

## 2022-11-24 DIAGNOSIS — Z5111 Encounter for antineoplastic chemotherapy: Secondary | ICD-10-CM | POA: Diagnosis not present

## 2022-11-24 DIAGNOSIS — C189 Malignant neoplasm of colon, unspecified: Secondary | ICD-10-CM

## 2022-11-24 LAB — CEA: CEA: 21.2 ng/mL — ABNORMAL HIGH (ref 0.0–4.7)

## 2022-11-24 MED ORDER — SODIUM CHLORIDE 0.9% FLUSH
10.0000 mL | INTRAVENOUS | Status: DC | PRN
  Administered 2022-11-24: 10 mL

## 2022-11-24 MED ORDER — HEPARIN SOD (PORK) LOCK FLUSH 100 UNIT/ML IV SOLN
500.0000 [IU] | Freq: Once | INTRAVENOUS | Status: AC | PRN
  Administered 2022-11-24: 500 [IU]

## 2022-11-24 NOTE — Patient Instructions (Signed)
[facility]  Discharge Instructions: Thank you for choosing [facility] to provide your oncology and hematology care.  If you have a lab appointment with the Cancer Center - please note that after April 8th, 2024, all labs will be drawn in the cancer center.  You do not have to check in or register with the main entrance as you have in the past but will complete your check-in in the cancer center.  Wear comfortable clothing and clothing appropriate for easy access to any Portacath or PICC line.   We strive to give you quality time with your provider. You may need to reschedule your appointment if you arrive late (15 or more minutes).  Arriving late affects you and other patients whose appointments are after yours.  Also, if you miss three or more appointments without notifying the office, you may be dismissed from the clinic at the provider's discretion.      For prescription refill requests, have your pharmacy contact our office and allow 72 hours for refills to be completed.    Today you received the following chemotherapy and/or immunotherapy agents Pump dc      To help prevent nausea and vomiting after your treatment, we encourage you to take your nausea medication as directed.  BELOW ARE SYMPTOMS THAT SHOULD BE REPORTED IMMEDIATELY: *FEVER GREATER THAN 100.4 F (38 C) OR HIGHER *CHILLS OR SWEATING *NAUSEA AND VOMITING THAT IS NOT CONTROLLED WITH YOUR NAUSEA MEDICATION *UNUSUAL SHORTNESS OF BREATH *UNUSUAL BRUISING OR BLEEDING *URINARY PROBLEMS (pain or burning when urinating, or frequent urination) *BOWEL PROBLEMS (unusual diarrhea, constipation, pain near the anus) TENDERNESS IN MOUTH AND THROAT WITH OR WITHOUT PRESENCE OF ULCERS (sore throat, sores in mouth, or a toothache) UNUSUAL RASH, SWELLING OR PAIN  UNUSUAL VAGINAL DISCHARGE OR ITCHING   Items with * indicate a potential emergency and should be followed up as soon as possible or go to the  Emergency Department if any problems should occur.  Please show the CHEMOTHERAPY ALERT CARD or IMMUNOTHERAPY ALERT CARD at check-in to the Emergency Department and triage nurse.  Should you have questions after your visit or need to cancel or reschedule your appointment, please contact [facility] [phone]  and follow the prompts.  Office hours are 8:00 a.m. to 4:30 p.m. Monday - Friday. Please note that voicemails left after 4:00 p.m. may not be returned until the following business day.  We are closed weekends and major holidays. You have access to a nurse at all times for urgent questions. Please call the main number to the clinic [phone] and follow the prompts.  For any non-urgent questions, you may also contact your provider using MyChart. We now offer e-Visits for anyone 69 and older to request care online for non-urgent symptoms. For details visit [email or web address].   Also download the MyChart app! Go to the app store, search "MyChart", open the app, select [place], and log in with your MyChart username and password.

## 2022-11-24 NOTE — Progress Notes (Signed)
Patient presents today for 5FU pump stop and disconnection after 46 hour continous infusion.   5FU pump deaccessed.  Patients port flushed without difficulty.  Good blood return noted with no bruising or swelling noted at site.  Needle removed intact.  Band aid applied.  VSS with discharge and left in satisfactory condition via wheelchair with no s/s of distress noted.

## 2022-12-06 ENCOUNTER — Inpatient Hospital Stay: Payer: Medicaid Other

## 2022-12-06 ENCOUNTER — Inpatient Hospital Stay: Payer: Medicaid Other | Admitting: Hematology

## 2022-12-06 VITALS — BP 141/73 | HR 67 | Temp 99.7°F | Resp 18 | Wt 192.6 lb

## 2022-12-06 DIAGNOSIS — C189 Malignant neoplasm of colon, unspecified: Secondary | ICD-10-CM

## 2022-12-06 DIAGNOSIS — Z5111 Encounter for antineoplastic chemotherapy: Secondary | ICD-10-CM | POA: Diagnosis not present

## 2022-12-06 DIAGNOSIS — Z95828 Presence of other vascular implants and grafts: Secondary | ICD-10-CM

## 2022-12-06 LAB — CBC WITH DIFFERENTIAL/PLATELET
Abs Immature Granulocytes: 0.02 10*3/uL (ref 0.00–0.07)
Basophils Absolute: 0 10*3/uL (ref 0.0–0.1)
Basophils Relative: 1 %
Eosinophils Absolute: 0.1 10*3/uL (ref 0.0–0.5)
Eosinophils Relative: 2 %
HCT: 35.2 % — ABNORMAL LOW (ref 36.0–46.0)
Hemoglobin: 11.8 g/dL — ABNORMAL LOW (ref 12.0–15.0)
Immature Granulocytes: 0 %
Lymphocytes Relative: 40 %
Lymphs Abs: 2.1 10*3/uL (ref 0.7–4.0)
MCH: 31.2 pg (ref 26.0–34.0)
MCHC: 33.5 g/dL (ref 30.0–36.0)
MCV: 93.1 fL (ref 80.0–100.0)
Monocytes Absolute: 0.6 10*3/uL (ref 0.1–1.0)
Monocytes Relative: 11 %
Neutro Abs: 2.4 10*3/uL (ref 1.7–7.7)
Neutrophils Relative %: 46 %
Platelets: 269 10*3/uL (ref 150–400)
RBC: 3.78 MIL/uL — ABNORMAL LOW (ref 3.87–5.11)
RDW: 17.1 % — ABNORMAL HIGH (ref 11.5–15.5)
WBC: 5.2 10*3/uL (ref 4.0–10.5)
nRBC: 0 % (ref 0.0–0.2)

## 2022-12-06 LAB — COMPREHENSIVE METABOLIC PANEL
ALT: 20 U/L (ref 0–44)
AST: 22 U/L (ref 15–41)
Albumin: 3.4 g/dL — ABNORMAL LOW (ref 3.5–5.0)
Alkaline Phosphatase: 61 U/L (ref 38–126)
Anion gap: 9 (ref 5–15)
BUN: 5 mg/dL — ABNORMAL LOW (ref 6–20)
CO2: 21 mmol/L — ABNORMAL LOW (ref 22–32)
Calcium: 8.2 mg/dL — ABNORMAL LOW (ref 8.9–10.3)
Chloride: 107 mmol/L (ref 98–111)
Creatinine, Ser: 0.66 mg/dL (ref 0.44–1.00)
GFR, Estimated: >60 mL/min (ref >60)
Glucose, Bld: 113 mg/dL — ABNORMAL HIGH (ref 70–99)
Potassium: 2.9 mmol/L — ABNORMAL LOW (ref 3.5–5.1)
Sodium: 137 mmol/L (ref 135–145)
Total Bilirubin: 0.6 mg/dL (ref 0.3–1.2)
Total Protein: 6.6 g/dL (ref 6.5–8.1)

## 2022-12-06 LAB — MAGNESIUM: Magnesium: 1.7 mg/dL (ref 1.7–2.4)

## 2022-12-06 MED ORDER — PALONOSETRON HCL INJECTION 0.25 MG/5ML
0.2500 mg | Freq: Once | INTRAVENOUS | Status: AC
  Administered 2022-12-06: 0.25 mg via INTRAVENOUS
  Filled 2022-12-06: qty 5

## 2022-12-06 MED ORDER — SODIUM CHLORIDE 0.9 % IV SOLN
2400.0000 mg/m2 | INTRAVENOUS | Status: DC
  Administered 2022-12-06: 4450 mg via INTRAVENOUS
  Filled 2022-12-06: qty 89

## 2022-12-06 MED ORDER — DEXAMETHASONE SODIUM PHOSPHATE 10 MG/ML IJ SOLN
10.0000 mg | Freq: Once | INTRAMUSCULAR | Status: AC
  Administered 2022-12-06: 10 mg via INTRAVENOUS
  Filled 2022-12-06: qty 1

## 2022-12-06 MED ORDER — SODIUM CHLORIDE 0.9% FLUSH
10.0000 mL | INTRAVENOUS | Status: AC
  Administered 2022-12-06: 10 mL

## 2022-12-06 MED ORDER — FLUOROURACIL CHEMO INJECTION 2.5 GM/50ML
400.0000 mg/m2 | Freq: Once | INTRAVENOUS | Status: AC
Start: 2022-12-06 — End: 2022-12-06
  Administered 2022-12-06: 750 mg via INTRAVENOUS
  Filled 2022-12-06: qty 15

## 2022-12-06 MED ORDER — ATROPINE SULFATE 1 MG/ML IV SOLN
1.0000 mg | Freq: Once | INTRAVENOUS | Status: AC
  Administered 2022-12-06: 1 mg via INTRAVENOUS
  Filled 2022-12-06: qty 1

## 2022-12-06 MED ORDER — SODIUM CHLORIDE 0.9 % IV SOLN: Freq: Once | INTRAVENOUS | Status: AC

## 2022-12-06 MED ORDER — IRINOTECAN HCL CHEMO INJECTION 100 MG/5ML
165.0000 mg/m2 | Freq: Once | INTRAVENOUS | Status: AC
  Administered 2022-12-06: 300 mg via INTRAVENOUS
  Filled 2022-12-06: qty 15

## 2022-12-06 MED ORDER — POTASSIUM CHLORIDE CRYS ER 20 MEQ PO TBCR: 40.0000 meq | EXTENDED_RELEASE_TABLET | Freq: Once | ORAL | Status: DC

## 2022-12-06 MED ORDER — HEPARIN SOD (PORK) LOCK FLUSH 100 UNIT/ML IV SOLN
500.0000 [IU] | Freq: Once | INTRAVENOUS | Status: DC | PRN
Start: 2022-12-06 — End: 2022-12-06

## 2022-12-06 MED ORDER — SODIUM CHLORIDE 0.9 % IV SOLN
150.0000 mg | Freq: Once | INTRAVENOUS | Status: AC
  Administered 2022-12-06: 150 mg via INTRAVENOUS
  Filled 2022-12-06: qty 150

## 2022-12-06 MED ORDER — SODIUM CHLORIDE 0.9 % IV SOLN
6.0000 mg/kg | Freq: Once | INTRAVENOUS | Status: AC
Start: 2022-12-06 — End: 2022-12-06
  Administered 2022-12-06: 500 mg via INTRAVENOUS
  Filled 2022-12-06: qty 20

## 2022-12-06 MED ORDER — SODIUM CHLORIDE 0.9 % IV SOLN
200.0000 mg/m2 | Freq: Once | INTRAVENOUS | Status: AC
  Administered 2022-12-06: 370 mg via INTRAVENOUS
  Filled 2022-12-06: qty 18.5

## 2022-12-06 NOTE — Progress Notes (Signed)
Patient presents today for Folfiri infusion with 5FU pump start.  Labs and vital signs within parameters for treatment.  Potassium noted to be 2.9, patient stating that she will take her home Potassium.  No new complaints at this time.  Stable during infusion without adverse affects.  Vital signs stable.  5FU pump start and verified RUN on the screen with the patient.  No complaints at this time.  Discharge from clinic ambulatory in stable condition.  Alert and oriented X 3.  Follow up with [facility] as scheduled.

## 2022-12-06 NOTE — Patient Instructions (Signed)
[facility]  Discharge Instructions: Thank you for choosing [facility] to provide your oncology and hematology care.  If you have a lab appointment with the Cancer Center - please note that after April 8th, 2024, all labs will be drawn in the cancer center.  You do not have to check in or register with the main entrance as you have in the past but will complete your check-in in the cancer center.  Wear comfortable clothing and clothing appropriate for easy access to any Portacath or PICC line.   We strive to give you quality time with your provider. You may need to reschedule your appointment if you arrive late (15 or more minutes).  Arriving late affects you and other patients whose appointments are after yours.  Also, if you miss three or more appointments without notifying the office, you may be dismissed from the clinic at the provider's discretion.      For prescription refill requests, have your pharmacy contact our office and allow 72 hours for refills to be completed.    Today you received the following chemotherapy and/or immunotherapy agents Folfiri with 5FU pump start      To help prevent nausea and vomiting after your treatment, we encourage you to take your nausea medication as directed.  BELOW ARE SYMPTOMS THAT SHOULD BE REPORTED IMMEDIATELY: *FEVER GREATER THAN 100.4 F (38 C) OR HIGHER *CHILLS OR SWEATING *NAUSEA AND VOMITING THAT IS NOT CONTROLLED WITH YOUR NAUSEA MEDICATION *UNUSUAL SHORTNESS OF BREATH *UNUSUAL BRUISING OR BLEEDING *URINARY PROBLEMS (pain or burning when urinating, or frequent urination) *BOWEL PROBLEMS (unusual diarrhea, constipation, pain near the anus) TENDERNESS IN MOUTH AND THROAT WITH OR WITHOUT PRESENCE OF ULCERS (sore throat, sores in mouth, or a toothache) UNUSUAL RASH, SWELLING OR PAIN  UNUSUAL VAGINAL DISCHARGE OR ITCHING   Items with * indicate a potential emergency and should be followed up as soon as  possible or go to the Emergency Department if any problems should occur.  Please show the CHEMOTHERAPY ALERT CARD or IMMUNOTHERAPY ALERT CARD at check-in to the Emergency Department and triage nurse.  Should you have questions after your visit or need to cancel or reschedule your appointment, please contact [facility] [phone]  and follow the prompts.  Office hours are 8:00 a.m. to 4:30 p.m. Monday - Friday. Please note that voicemails left after 4:00 p.m. may not be returned until the following business day.  We are closed weekends and major holidays. You have access to a nurse at all times for urgent questions. Please call the main number to the clinic [phone] and follow the prompts.  For any non-urgent questions, you may also contact your provider using MyChart. We now offer e-Visits for anyone 33 and older to request care online for non-urgent symptoms. For details visit [email or web address].   Also download the MyChart app! Go to the app store, search "MyChart", open the app, select [place], and log in with your MyChart username and password.

## 2022-12-07 ENCOUNTER — Ambulatory Visit: Payer: Medicaid Other | Admitting: Hematology

## 2022-12-07 ENCOUNTER — Ambulatory Visit: Payer: Medicaid Other

## 2022-12-07 ENCOUNTER — Other Ambulatory Visit: Payer: Medicaid Other

## 2022-12-08 ENCOUNTER — Inpatient Hospital Stay: Payer: Medicaid Other

## 2022-12-08 VITALS — BP 145/88 | HR 68 | Temp 98.0°F | Resp 19

## 2022-12-08 DIAGNOSIS — C189 Malignant neoplasm of colon, unspecified: Secondary | ICD-10-CM

## 2022-12-08 DIAGNOSIS — Z5111 Encounter for antineoplastic chemotherapy: Secondary | ICD-10-CM | POA: Diagnosis not present

## 2022-12-08 DIAGNOSIS — Z95828 Presence of other vascular implants and grafts: Secondary | ICD-10-CM

## 2022-12-08 MED ORDER — SODIUM CHLORIDE 0.9% FLUSH
10.0000 mL | INTRAVENOUS | Status: DC | PRN
  Administered 2022-12-08: 10 mL

## 2022-12-08 MED ORDER — HEPARIN SOD (PORK) LOCK FLUSH 100 UNIT/ML IV SOLN
500.0000 [IU] | Freq: Once | INTRAVENOUS | Status: AC | PRN
Start: 2022-12-08 — End: 2022-12-08
  Administered 2022-12-08: 500 [IU]

## 2022-12-08 NOTE — Progress Notes (Signed)
Patients port flushed without difficulty.  Good blood return noted with no bruising or swelling noted at site.  Band aid applied.  Home infusion pump disconnected with no issues.  VSS with discharge and left in satisfactory condition with no s/s of distress noted.

## 2022-12-20 ENCOUNTER — Inpatient Hospital Stay: Payer: Medicaid Other | Admitting: Hematology

## 2022-12-20 ENCOUNTER — Inpatient Hospital Stay: Payer: Medicaid Other | Attending: Hematology

## 2022-12-20 ENCOUNTER — Inpatient Hospital Stay: Payer: Medicaid Other

## 2022-12-20 ENCOUNTER — Other Ambulatory Visit: Payer: Self-pay

## 2022-12-20 VITALS — BP 137/80 | HR 77 | Temp 98.0°F | Resp 18 | Wt 196.6 lb

## 2022-12-20 DIAGNOSIS — Z5111 Encounter for antineoplastic chemotherapy: Secondary | ICD-10-CM | POA: Insufficient documentation

## 2022-12-20 DIAGNOSIS — C189 Malignant neoplasm of colon, unspecified: Secondary | ICD-10-CM

## 2022-12-20 DIAGNOSIS — Z95828 Presence of other vascular implants and grafts: Secondary | ICD-10-CM

## 2022-12-20 LAB — MAGNESIUM: Magnesium: 1.6 mg/dL — ABNORMAL LOW (ref 1.7–2.4)

## 2022-12-20 LAB — COMPREHENSIVE METABOLIC PANEL
ALT: 15 U/L (ref 0–44)
AST: 15 U/L (ref 15–41)
Albumin: 3.3 g/dL — ABNORMAL LOW (ref 3.5–5.0)
Alkaline Phosphatase: 60 U/L (ref 38–126)
Anion gap: 8 (ref 5–15)
BUN: 5 mg/dL — ABNORMAL LOW (ref 6–20)
CO2: 21 mmol/L — ABNORMAL LOW (ref 22–32)
Calcium: 8.1 mg/dL — ABNORMAL LOW (ref 8.9–10.3)
Chloride: 109 mmol/L (ref 98–111)
Creatinine, Ser: 0.76 mg/dL (ref 0.44–1.00)
GFR, Estimated: >60 mL/min (ref >60)
Glucose, Bld: 107 mg/dL — ABNORMAL HIGH (ref 70–99)
Potassium: 3.1 mmol/L — ABNORMAL LOW (ref 3.5–5.1)
Sodium: 138 mmol/L (ref 135–145)
Total Bilirubin: 0.5 mg/dL (ref <1.2)
Total Protein: 6.3 g/dL — ABNORMAL LOW (ref 6.5–8.1)

## 2022-12-20 LAB — CBC WITH DIFFERENTIAL/PLATELET
Abs Immature Granulocytes: 0.02 10*3/uL (ref 0.00–0.07)
Basophils Absolute: 0.1 10*3/uL (ref 0.0–0.1)
Basophils Relative: 1 %
Eosinophils Absolute: 0.1 10*3/uL (ref 0.0–0.5)
Eosinophils Relative: 2 %
HCT: 35.3 % — ABNORMAL LOW (ref 36.0–46.0)
Hemoglobin: 11.4 g/dL — ABNORMAL LOW (ref 12.0–15.0)
Immature Granulocytes: 0 %
Lymphocytes Relative: 43 %
Lymphs Abs: 2.1 10*3/uL (ref 0.7–4.0)
MCH: 30.6 pg (ref 26.0–34.0)
MCHC: 32.3 g/dL (ref 30.0–36.0)
MCV: 94.6 fL (ref 80.0–100.0)
Monocytes Absolute: 0.6 10*3/uL (ref 0.1–1.0)
Monocytes Relative: 12 %
Neutro Abs: 2.1 10*3/uL (ref 1.7–7.7)
Neutrophils Relative %: 42 %
Platelets: 257 10*3/uL (ref 150–400)
RBC: 3.73 MIL/uL — ABNORMAL LOW (ref 3.87–5.11)
RDW: 16.6 % — ABNORMAL HIGH (ref 11.5–15.5)
WBC: 5 10*3/uL (ref 4.0–10.5)
nRBC: 0 % (ref 0.0–0.2)

## 2022-12-20 MED ORDER — MAGNESIUM SULFATE 2 GM/50ML IV SOLN
2.0000 g | Freq: Once | INTRAVENOUS | Status: AC
  Administered 2022-12-20: 2 g via INTRAVENOUS
  Filled 2022-12-20: qty 50

## 2022-12-20 MED ORDER — ATROPINE SULFATE 1 MG/ML IV SOLN
1.0000 mg | Freq: Once | INTRAVENOUS | Status: AC
  Administered 2022-12-20: 1 mg via INTRAVENOUS
  Filled 2022-12-20: qty 1

## 2022-12-20 MED ORDER — POTASSIUM CHLORIDE CRYS ER 20 MEQ PO TBCR
40.0000 meq | EXTENDED_RELEASE_TABLET | Freq: Once | ORAL | Status: AC
  Administered 2022-12-20: 40 meq via ORAL
  Filled 2022-12-20: qty 2

## 2022-12-20 MED ORDER — SODIUM CHLORIDE 0.9 % IV SOLN
2400.0000 mg/m2 | INTRAVENOUS | Status: DC
  Administered 2022-12-20: 4450 mg via INTRAVENOUS
  Filled 2022-12-20: qty 89

## 2022-12-20 MED ORDER — SODIUM CHLORIDE 0.9 % IV SOLN
150.0000 mg | Freq: Once | INTRAVENOUS | Status: AC
  Administered 2022-12-20: 150 mg via INTRAVENOUS
  Filled 2022-12-20: qty 150

## 2022-12-20 MED ORDER — FLUOROURACIL CHEMO INJECTION 2.5 GM/50ML
400.0000 mg/m2 | Freq: Once | INTRAVENOUS | Status: AC
  Administered 2022-12-20: 750 mg via INTRAVENOUS
  Filled 2022-12-20: qty 15

## 2022-12-20 MED ORDER — PALONOSETRON HCL INJECTION 0.25 MG/5ML
0.2500 mg | Freq: Once | INTRAVENOUS | Status: AC
  Administered 2022-12-20: 0.25 mg via INTRAVENOUS
  Filled 2022-12-20: qty 5

## 2022-12-20 MED ORDER — DEXAMETHASONE SODIUM PHOSPHATE 10 MG/ML IJ SOLN
10.0000 mg | Freq: Once | INTRAMUSCULAR | Status: AC
  Administered 2022-12-20: 10 mg via INTRAVENOUS
  Filled 2022-12-20: qty 1

## 2022-12-20 MED ORDER — LEUCOVORIN CALCIUM INJECTION 350 MG
200.0000 mg/m2 | Freq: Once | INTRAMUSCULAR | Status: AC
  Administered 2022-12-20: 370 mg via INTRAVENOUS
  Filled 2022-12-20: qty 18.5

## 2022-12-20 MED ORDER — IRINOTECAN HCL CHEMO INJECTION 100 MG/5ML
165.0000 mg/m2 | Freq: Once | INTRAVENOUS | Status: AC
  Administered 2022-12-20: 300 mg via INTRAVENOUS
  Filled 2022-12-20: qty 15

## 2022-12-20 MED ORDER — SODIUM CHLORIDE 0.9 % IV SOLN: Freq: Once | INTRAVENOUS | Status: AC

## 2022-12-20 MED ORDER — SODIUM CHLORIDE 0.9 % IV SOLN
6.0000 mg/kg | Freq: Once | INTRAVENOUS | Status: AC
Start: 2022-12-20 — End: 2022-12-20
  Administered 2022-12-20: 500 mg via INTRAVENOUS
  Filled 2022-12-20: qty 20

## 2022-12-20 NOTE — Progress Notes (Signed)
Patient presents today for treatment. (Folfiri+Vectibix) D1C30. Vital signs within parameters for treatment. Labs within parameters for treatment. Potassium 3.1. Magnesium 1.6. Dr. Ellin Saba standing orders to be followed.   Treatment given today per MD orders. Tolerated infusion without adverse affects. Vital signs stable. No complaints at this time. RUN noted on 5FU pump and verified with patient. Discharged from clinic ambulatory in stable condition. Alert and oriented x 3. F/U with [facility] as scheduled.

## 2022-12-20 NOTE — Patient Instructions (Signed)
[facility] - A DEPT OF [facility]  Discharge Instructions: Thank you for choosing [facility] to provide your oncology and hematology care.  If you have a lab appointment with the Cancer Center - please note that after April 8th, 2024, all labs will be drawn in the cancer center.  You do not have to check in or register with the main entrance as you have in the past but will complete your check-in in the cancer center.  Wear comfortable clothing and clothing appropriate for easy access to any Portacath or PICC line.   We strive to give you quality time with your provider. You may need to reschedule your appointment if you arrive late (15 or more minutes).  Arriving late affects you and other patients whose appointments are after yours.  Also, if you miss three or more appointments without notifying the office, you may be dismissed from the clinic at the provider's discretion.      For prescription refill requests, have your pharmacy contact our office and allow 72 hours for refills to be completed.    Today you received the following chemotherapy and/or immunotherapy agents vectibix, irinotecan, leucovorin and adrucil. Fluorouracil Injection What is this medication? FLUOROURACIL (flure oh YOOR a sil) treats some types of cancer. It works by slowing down the growth of cancer cells. This medicine may be used for other purposes; ask your health care provider or pharmacist if you have questions. COMMON BRAND NAME(S): Adrucil What should I tell my care team before I take this medication? They need to know if you have any of these conditions: Blood disorders Dihydropyrimidine dehydrogenase (DPD) deficiency Infection, such as chickenpox, cold sores, herpes Kidney disease Liver disease Poor nutrition Recent or ongoing radiation therapy An unusual or allergic reaction to fluorouracil, other medications, foods, dyes, or preservatives If you or your  partner are pregnant or trying to get pregnant Breast-feeding How should I use this medication? This medication is injected into a vein. It is administered by your care team in a hospital or clinic setting. Talk to your care team about the use of this medication in children. Special care may be needed. Overdosage: If you think you have taken too much of this medicine contact a poison control center or emergency room at once. NOTE: This medicine is only for you. Do not share this medicine with others. What if I miss a dose? Keep appointments for follow-up doses. It is important not to miss your dose. Call your care team if you are unable to keep an appointment. What may interact with this medication? Do not take this medication with any of the following: Live virus vaccines This medication may also interact with the following: Medications that treat or prevent blood clots, such as warfarin, enoxaparin, dalteparin This list may not describe all possible interactions. Give your health care provider a list of all the medicines, herbs, non-prescription drugs, or dietary supplements you use. Also tell them if you smoke, drink alcohol, or use illegal drugs. Some items may interact with your medicine. What should I watch for while using this medication? Your condition will be monitored carefully while you are receiving this medication. This medication may make you feel generally unwell. This is not uncommon as chemotherapy can affect healthy cells as well as cancer cells. Report any side effects. Continue your course of treatment even though you feel ill unless your care team tells you to stop. In some cases, you may  be given additional medications to help with side effects. Follow all directions for their use. This medication may increase your risk of getting an infection. Call your care team for advice if you get a fever, chills, sore throat, or other symptoms of a cold or flu. Do not treat yourself.  Try to avoid being around people who are sick. This medication may increase your risk to bruise or bleed. Call your care team if you notice any unusual bleeding. Be careful brushing or flossing your teeth or using a toothpick because you may get an infection or bleed more easily. If you have any dental work done, tell your dentist you are receiving this medication. Avoid taking medications that contain aspirin, acetaminophen, ibuprofen, naproxen, or ketoprofen unless instructed by your care team. These medications may hide a fever. Do not treat diarrhea with over the counter products. Contact your care team if you have diarrhea that lasts more than 2 days or if it is severe and watery. This medication can make you more sensitive to the sun. Keep out of the sun. If you cannot avoid being in the sun, wear protective clothing and sunscreen. Do not use sun lamps, tanning beds, or tanning booths. Talk to your care team if you or your partner wish to become pregnant or think you might be pregnant. This medication can cause serious birth defects if taken during pregnancy and for 3 months after the last dose. A reliable form of contraception is recommended while taking this medication and for 3 months after the last dose. Talk to your care team about effective forms of contraception. Do not father a child while taking this medication and for 3 months after the last dose. Use a condom while having sex during this time period. Do not breastfeed while taking this medication. This medication may cause infertility. Talk to your care team if you are concerned about your fertility. What side effects may I notice from receiving this medication? Side effects that you should report to your care team as soon as possible: Allergic reactions--skin rash, itching, hives, swelling of the face, lips, tongue, or throat Heart attack--pain or tightness in the chest, shoulders, arms, or jaw, nausea, shortness of breath, cold or  clammy skin, feeling faint or lightheaded Heart failure--shortness of breath, swelling of the ankles, feet, or hands, sudden weight gain, unusual weakness or fatigue Heart rhythm changes--fast or irregular heartbeat, dizziness, feeling faint or lightheaded, chest pain, trouble breathing High ammonia level--unusual weakness or fatigue, confusion, loss of appetite, nausea, vomiting, seizures Infection--fever, chills, cough, sore throat, wounds that don't heal, pain or trouble when passing urine, general feeling of discomfort or being unwell Low red blood cell level--unusual weakness or fatigue, dizziness, headache, trouble breathing Pain, tingling, or numbness in the hands or feet, muscle weakness, change in vision, confusion or trouble speaking, loss of balance or coordination, trouble walking, seizures Redness, swelling, and blistering of the skin over hands and feet Severe or prolonged diarrhea Unusual bruising or bleeding Side effects that usually do not require medical attention (report to your care team if they continue or are bothersome): Dry skin Headache Increased tears Nausea Pain, redness, or swelling with sores inside the mouth or throat Sensitivity to light Vomiting This list may not describe all possible side effects. Call your doctor for medical advice about side effects. You may report side effects to FDA at 1-800-FDA-1088. Where should I keep my medication? This medication is given in a hospital or clinic. It will not be  stored at home. NOTE: This sheet is a summary. It may not cover all possible information. If you have questions about this medicine, talk to your doctor, pharmacist, or health care provider.  2024 Elsevier/Gold Standard (2021-06-01 00:00:00) Leucovorin Injection What is this medication? LEUCOVORIN (loo koe VOR in) prevents side effects from certain medications, such as methotrexate. It works by increasing folate levels. This helps protect healthy cells in  your body. It may also be used to treat anemia caused by low levels of folate. It can also be used with fluorouracil, a type of chemotherapy, to treat colorectal cancer. It works by increasing the effects of fluorouracil in the body. This medicine may be used for other purposes; ask your health care provider or pharmacist if you have questions. What should I tell my care team before I take this medication? They need to know if you have any of these conditions: Anemia from low levels of vitamin B12 in the blood An unusual or allergic reaction to leucovorin, folic acid, other medications, foods, dyes, or preservatives Pregnant or trying to get pregnant Breastfeeding How should I use this medication? This medication is injected into a vein or a muscle. It is given by your care team in a hospital or clinic setting. Talk to your care team about the use of this medication in children. Special care may be needed. Overdosage: If you think you have taken too much of this medicine contact a poison control center or emergency room at once. NOTE: This medicine is only for you. Do not share this medicine with others. What if I miss a dose? Keep appointments for follow-up doses. It is important not to miss your dose. Call your care team if you are unable to keep an appointment. What may interact with this medication? Capecitabine Fluorouracil Phenobarbital Phenytoin Primidone Trimethoprim;sulfamethoxazole This list may not describe all possible interactions. Give your health care provider a list of all the medicines, herbs, non-prescription drugs, or dietary supplements you use. Also tell them if you smoke, drink alcohol, or use illegal drugs. Some items may interact with your medicine. What should I watch for while using this medication? Your condition will be monitored carefully while you are receiving this medication. This medication may increase the side effects of 5-fluorouracil. Tell your care team  if you have diarrhea or mouth sores that do not get better or that get worse. What side effects may I notice from receiving this medication? Side effects that you should report to your care team as soon as possible: Allergic reactions--skin rash, itching, hives, swelling of the face, lips, tongue, or throat This list may not describe all possible side effects. Call your doctor for medical advice about side effects. You may report side effects to FDA at 1-800-FDA-1088. Where should I keep my medication? This medication is given in a hospital or clinic. It will not be stored at home. NOTE: This sheet is a summary. It may not cover all possible information. If you have questions about this medicine, talk to your doctor, pharmacist, or health care provider.  2024 Elsevier/Gold Standard (2021-06-29 00:00:00) Irinotecan Injection What is this medication? IRINOTECAN (ir in oh TEE kan) treats some types of cancer. It works by slowing down the growth of cancer cells. This medicine may be used for other purposes; ask your health care provider or pharmacist if you have questions. COMMON BRAND NAME(S): Camptosar What should I tell my care team before I take this medication? They need to know if you have  any of these conditions: Dehydration Diarrhea Infection, especially a viral infection, such as chickenpox, cold sores, herpes Liver disease Low blood cell levels (white cells, red cells, and platelets) Low levels of electrolytes, such as calcium, magnesium, or potassium in your blood Recent or ongoing radiation An unusual or allergic reaction to irinotecan, other medications, foods, dyes, or preservatives If you or your partner are pregnant or trying to get pregnant Breast-feeding How should I use this medication? This medication is injected into a vein. It is given by your care team in a hospital or clinic setting. Talk to your care team about the use of this medication in children. Special care may  be needed. Overdosage: If you think you have taken too much of this medicine contact a poison control center or emergency room at once. NOTE: This medicine is only for you. Do not share this medicine with others. What if I miss a dose? Keep appointments for follow-up doses. It is important not to miss your dose. Call your care team if you are unable to keep an appointment. What may interact with this medication? Do not take this medication with any of the following: Cobicistat Itraconazole This medication may also interact with the following: Certain antibiotics, such as clarithromycin, rifampin, rifabutin Certain antivirals for HIV or AIDS Certain medications for fungal infections, such as ketoconazole, posaconazole, voriconazole Certain medications for seizures, such as carbamazepine, phenobarbital, phenytoin Gemfibrozil Nefazodone St. John's wort This list may not describe all possible interactions. Give your health care provider a list of all the medicines, herbs, non-prescription drugs, or dietary supplements you use. Also tell them if you smoke, drink alcohol, or use illegal drugs. Some items may interact with your medicine. What should I watch for while using this medication? Your condition will be monitored carefully while you are receiving this medication. You may need blood work while taking this medication. This medication may make you feel generally unwell. This is not uncommon as chemotherapy can affect healthy cells as well as cancer cells. Report any side effects. Continue your course of treatment even though you feel ill unless your care team tells you to stop. This medication can cause serious side effects. To reduce the risk, your care team may give you other medications to take before receiving this one. Be sure to follow the directions from your care team. This medication may affect your coordination, reaction time, or judgement. Do not drive or operate machinery until you  know how this medication affects you. Sit up or stand slowly to reduce the risk of dizzy or fainting spells. Drinking alcohol with this medication can increase the risk of these side effects. This medication may increase your risk of getting an infection. Call your care team for advice if you get a fever, chills, sore throat, or other symptoms of a cold or flu. Do not treat yourself. Try to avoid being around people who are sick. Avoid taking medications that contain aspirin, acetaminophen, ibuprofen, naproxen, or ketoprofen unless instructed by your care team. These medications may hide a fever. This medication may increase your risk to bruise or bleed. Call your care team if you notice any unusual bleeding. Be careful brushing or flossing your teeth or using a toothpick because you may get an infection or bleed more easily. If you have any dental work done, tell your dentist you are receiving this medication. Talk to your care team if you or your partner are pregnant or think either of you might be pregnant. This  medication can cause serious birth defects if taken during pregnancy and for 6 months after the last dose. You will need a negative pregnancy test before starting this medication. Contraception is recommended while taking this medication and for 6 months after the last dose. Your care team can help you find the option that works for you. Do not father a child while taking this medication and for 3 months after the last dose. Use a condom for contraception during this time period. Do not breastfeed while taking this medication and for 7 days after the last dose. This medication may cause infertility. Talk to your care team if you are concerned about your fertility. What side effects may I notice from receiving this medication? Side effects that you should report to your care team as soon as possible: Allergic reactions--skin rash, itching, hives, swelling of the face, lips, tongue, or  throat Dry cough, shortness of breath or trouble breathing Increased saliva or tears, increased sweating, stomach cramping, diarrhea, small pupils, unusual weakness or fatigue, slow heartbeat Infection--fever, chills, cough, sore throat, wounds that don't heal, pain or trouble when passing urine, general feeling of discomfort or being unwell Kidney injury--decrease in the amount of urine, swelling of the ankles, hands, or feet Low red blood cell level--unusual weakness or fatigue, dizziness, headache, trouble breathing Severe or prolonged diarrhea Unusual bruising or bleeding Side effects that usually do not require medical attention (report to your care team if they continue or are bothersome): Constipation Diarrhea Hair loss Loss of appetite Nausea Stomach pain This list may not describe all possible side effects. Call your doctor for medical advice about side effects. You may report side effects to FDA at 1-800-FDA-1088. Where should I keep my medication? This medication is given in a hospital or clinic. It will not be stored at home. NOTE: This sheet is a summary. It may not cover all possible information. If you have questions about this medicine, talk to your doctor, pharmacist, or health care provider.  2024 Elsevier/Gold Standard (2021-06-07 00:00:00) Panitumumab Injection What is this medication? PANITUMUMAB (pan i TOOM ue mab) treats colorectal cancer. It works by blocking a protein that causes cancer cells to grow and multiply. This helps to slow or stop the spread of cancer cells. It is a monoclonal antibody. This medicine may be used for other purposes; ask your health care provider or pharmacist if you have questions. COMMON BRAND NAME(S): Vectibix What should I tell my care team before I take this medication? They need to know if you have any of these conditions: Eye disease Low levels of magnesium in the blood Lung disease An unusual or allergic reaction to panitumumab,  other medications, foods, dyes, or preservatives Pregnant or trying to get pregnant Breast-feeding How should I use this medication? This medication is injected into a vein. It is given by your care team in a hospital or clinic setting. Talk to your care team about the use of this medication in children. Special care may be needed. Overdosage: If you think you have taken too much of this medicine contact a poison control center or emergency room at once. NOTE: This medicine is only for you. Do not share this medicine with others. What if I miss a dose? Keep appointments for follow-up doses. It is important not to miss your dose. Call your care team if you are unable to keep an appointment. What may interact with this medication? Bevacizumab This list may not describe all possible interactions. Give your health  care provider a list of all the medicines, herbs, non-prescription drugs, or dietary supplements you use. Also tell them if you smoke, drink alcohol, or use illegal drugs. Some items may interact with your medicine. What should I watch for while using this medication? Your condition will be monitored carefully while you are receiving this medication. This medication may make you feel generally unwell. This is not uncommon as chemotherapy can affect healthy cells as well as cancer cells. Report any side effects. Continue your course of treatment even though you feel ill unless your care team tells you to stop. This medication can make you more sensitive to the sun. Keep out of the sun while receiving this medication and for 2 months after stopping therapy. If you cannot avoid being in the sun, wear protective clothing and sunscreen. Do not use sun lamps, tanning beds, or tanning booths. Check with your care team if you have severe diarrhea, nausea, and vomiting or if you sweat a lot. The loss of too much body fluid may make it dangerous for you to take this medication. This medication may  cause serious skin reactions. They can happen weeks to months after starting the medication. Contact your care team right away if you notice fevers or flu-like symptoms with a rash. The rash may be red or purple and then turn into blisters or peeling of the skin. You may also notice a red rash with swelling of the face, lips, or lymph nodes in your neck or under your arms. Talk to your care team if you may be pregnant. Serious birth defects can occur if you take this medication during pregnancy and for 2 months after the last dose. Contraception is recommended while taking this medication and for 2 months after the last dose. Your care team can help you find the option that works for you. Do not breastfeed while taking this medication and for 2 months after the last dose. This medication may cause infertility. Talk to your care team if you are concerned about your fertility. What side effects may I notice from receiving this medication? Side effects that you should report to your care team as soon as possible: Allergic reactions--skin rash, itching, hives, swelling of the face, lips, tongue, or throat Dry cough, shortness of breath or trouble breathing Eye pain, redness, irritation, or discharge with blurry or decreased vision Infusion reactions--chest pain, shortness of breath or trouble breathing, feeling faint or lightheaded Low magnesium level--muscle pain or cramps, unusual weakness or fatigue, fast or irregular heartbeat, tremors Low potassium level--muscle pain or cramps, unusual weakness or fatigue, fast or irregular heartbeat, constipation Redness, blistering, peeling, or loosening of the skin, including inside the mouth Skin reactions on sun-exposed areas Side effects that usually do not require medical attention (report to your care team if they continue or are bothersome): Change in nail shape, thickness, or color Diarrhea Dry skin Fatigue Nausea Vomiting This list may not describe  all possible side effects. Call your doctor for medical advice about side effects. You may report side effects to FDA at 1-800-FDA-1088. Where should I keep my medication? This medication is given in a hospital or clinic. It will not be stored at home. NOTE: This sheet is a summary. It may not cover all possible information. If you have questions about this medicine, talk to your doctor, pharmacist, or health care provider.  2024 Elsevier/Gold Standard (2021-06-09 00:00:00)       To help prevent nausea and vomiting after your treatment, we  encourage you to take your nausea medication as directed.  BELOW ARE SYMPTOMS THAT SHOULD BE REPORTED IMMEDIATELY: *FEVER GREATER THAN 100.4 F (38 C) OR HIGHER *CHILLS OR SWEATING *NAUSEA AND VOMITING THAT IS NOT CONTROLLED WITH YOUR NAUSEA MEDICATION *UNUSUAL SHORTNESS OF BREATH *UNUSUAL BRUISING OR BLEEDING *URINARY PROBLEMS (pain or burning when urinating, or frequent urination) *BOWEL PROBLEMS (unusual diarrhea, constipation, pain near the anus) TENDERNESS IN MOUTH AND THROAT WITH OR WITHOUT PRESENCE OF ULCERS (sore throat, sores in mouth, or a toothache) UNUSUAL RASH, SWELLING OR PAIN  UNUSUAL VAGINAL DISCHARGE OR ITCHING   Items with * indicate a potential emergency and should be followed up as soon as possible or go to the Emergency Department if any problems should occur.  Please show the CHEMOTHERAPY ALERT CARD or IMMUNOTHERAPY ALERT CARD at check-in to the Emergency Department and triage nurse.  Should you have questions after your visit or need to cancel or reschedule your appointment, please contact [facility] - A DEPT OF [facility] [phone]  and follow the prompts.  Office hours are 8:00 a.m. to 4:30 p.m. Monday - Friday. Please note that voicemails left after 4:00 p.m. may not be returned until the following business day.  We are closed weekends and major holidays. You have access to a nurse at all  times for urgent questions. Please call the main number to the clinic [phone] and follow the prompts.  For any non-urgent questions, you may also contact your provider using MyChart. We now offer e-Visits for anyone 2 and older to request care online for non-urgent symptoms. For details visit [email or web address].   Also download the MyChart app! Go to the app store, search "MyChart", open the app, select [facility], and log in with your MyChart username and password.

## 2022-12-22 ENCOUNTER — Inpatient Hospital Stay: Payer: Medicaid Other

## 2022-12-22 VITALS — BP 126/70 | HR 69 | Temp 99.6°F | Resp 18

## 2022-12-22 DIAGNOSIS — Z95828 Presence of other vascular implants and grafts: Secondary | ICD-10-CM

## 2022-12-22 DIAGNOSIS — Z5111 Encounter for antineoplastic chemotherapy: Secondary | ICD-10-CM | POA: Diagnosis not present

## 2022-12-22 DIAGNOSIS — C189 Malignant neoplasm of colon, unspecified: Secondary | ICD-10-CM

## 2022-12-22 MED ORDER — SODIUM CHLORIDE 0.9% FLUSH
10.0000 mL | INTRAVENOUS | Status: DC | PRN
  Administered 2022-12-22: 10 mL

## 2022-12-22 MED ORDER — HEPARIN SOD (PORK) LOCK FLUSH 100 UNIT/ML IV SOLN
500.0000 [IU] | Freq: Once | INTRAVENOUS | Status: AC | PRN
Start: 2022-12-22 — End: 2022-12-22
  Administered 2022-12-22: 500 [IU]

## 2022-12-22 NOTE — Progress Notes (Signed)
Patient presents today for pump d/c. Vital signs are stable. Port a cath site clean, dry, and intact. Port flushed with 10 mls of Normal Saline and 500 Units of Heparin. Needle removed intact. Band aid applied. Patient has no complaints at this time. Discharged from clinic ambulatory and in stable condition. Patient alert and oriented.  

## 2022-12-22 NOTE — Patient Instructions (Signed)
[facility] - A DEPT OF [facility]  Discharge Instructions: Thank you for choosing [facility] to provide your oncology and hematology care.  If you have a lab appointment with the Cancer Center - please note that after April 8th, 2024, all labs will be drawn in the cancer center.  You do not have to check in or register with the main entrance as you have in the past but will complete your check-in in the cancer center.  Wear comfortable clothing and clothing appropriate for easy access to any Portacath or PICC line.   We strive to give you quality time with your provider. You may need to reschedule your appointment if you arrive late (15 or more minutes).  Arriving late affects you and other patients whose appointments are after yours.  Also, if you miss three or more appointments without notifying the office, you may be dismissed from the clinic at the provider's discretion.      For prescription refill requests, have your pharmacy contact our office and allow 72 hours for refills to be completed.    Today you received the following chemotherapy and/or immunotherapy agents adrucil. Fluorouracil Injection What is this medication? FLUOROURACIL (flure oh YOOR a sil) treats some types of cancer. It works by slowing down the growth of cancer cells. This medicine may be used for other purposes; ask your health care provider or pharmacist if you have questions. COMMON BRAND NAME(S): Adrucil What should I tell my care team before I take this medication? They need to know if you have any of these conditions: Blood disorders Dihydropyrimidine dehydrogenase (DPD) deficiency Infection, such as chickenpox, cold sores, herpes Kidney disease Liver disease Poor nutrition Recent or ongoing radiation therapy An unusual or allergic reaction to fluorouracil, other medications, foods, dyes, or preservatives If you or your partner are pregnant or trying to get  pregnant Breast-feeding How should I use this medication? This medication is injected into a vein. It is administered by your care team in a hospital or clinic setting. Talk to your care team about the use of this medication in children. Special care may be needed. Overdosage: If you think you have taken too much of this medicine contact a poison control center or emergency room at once. NOTE: This medicine is only for you. Do not share this medicine with others. What if I miss a dose? Keep appointments for follow-up doses. It is important not to miss your dose. Call your care team if you are unable to keep an appointment. What may interact with this medication? Do not take this medication with any of the following: Live virus vaccines This medication may also interact with the following: Medications that treat or prevent blood clots, such as warfarin, enoxaparin, dalteparin This list may not describe all possible interactions. Give your health care provider a list of all the medicines, herbs, non-prescription drugs, or dietary supplements you use. Also tell them if you smoke, drink alcohol, or use illegal drugs. Some items may interact with your medicine. What should I watch for while using this medication? Your condition will be monitored carefully while you are receiving this medication. This medication may make you feel generally unwell. This is not uncommon as chemotherapy can affect healthy cells as well as cancer cells. Report any side effects. Continue your course of treatment even though you feel ill unless your care team tells you to stop. In some cases, you may be given additional medications  to help with side effects. Follow all directions for their use. This medication may increase your risk of getting an infection. Call your care team for advice if you get a fever, chills, sore throat, or other symptoms of a cold or flu. Do not treat yourself. Try to avoid being around people who are  sick. This medication may increase your risk to bruise or bleed. Call your care team if you notice any unusual bleeding. Be careful brushing or flossing your teeth or using a toothpick because you may get an infection or bleed more easily. If you have any dental work done, tell your dentist you are receiving this medication. Avoid taking medications that contain aspirin, acetaminophen, ibuprofen, naproxen, or ketoprofen unless instructed by your care team. These medications may hide a fever. Do not treat diarrhea with over the counter products. Contact your care team if you have diarrhea that lasts more than 2 days or if it is severe and watery. This medication can make you more sensitive to the sun. Keep out of the sun. If you cannot avoid being in the sun, wear protective clothing and sunscreen. Do not use sun lamps, tanning beds, or tanning booths. Talk to your care team if you or your partner wish to become pregnant or think you might be pregnant. This medication can cause serious birth defects if taken during pregnancy and for 3 months after the last dose. A reliable form of contraception is recommended while taking this medication and for 3 months after the last dose. Talk to your care team about effective forms of contraception. Do not father a child while taking this medication and for 3 months after the last dose. Use a condom while having sex during this time period. Do not breastfeed while taking this medication. This medication may cause infertility. Talk to your care team if you are concerned about your fertility. What side effects may I notice from receiving this medication? Side effects that you should report to your care team as soon as possible: Allergic reactions--skin rash, itching, hives, swelling of the face, lips, tongue, or throat Heart attack--pain or tightness in the chest, shoulders, arms, or jaw, nausea, shortness of breath, cold or clammy skin, feeling faint or  lightheaded Heart failure--shortness of breath, swelling of the ankles, feet, or hands, sudden weight gain, unusual weakness or fatigue Heart rhythm changes--fast or irregular heartbeat, dizziness, feeling faint or lightheaded, chest pain, trouble breathing High ammonia level--unusual weakness or fatigue, confusion, loss of appetite, nausea, vomiting, seizures Infection--fever, chills, cough, sore throat, wounds that don't heal, pain or trouble when passing urine, general feeling of discomfort or being unwell Low red blood cell level--unusual weakness or fatigue, dizziness, headache, trouble breathing Pain, tingling, or numbness in the hands or feet, muscle weakness, change in vision, confusion or trouble speaking, loss of balance or coordination, trouble walking, seizures Redness, swelling, and blistering of the skin over hands and feet Severe or prolonged diarrhea Unusual bruising or bleeding Side effects that usually do not require medical attention (report to your care team if they continue or are bothersome): Dry skin Headache Increased tears Nausea Pain, redness, or swelling with sores inside the mouth or throat Sensitivity to light Vomiting This list may not describe all possible side effects. Call your doctor for medical advice about side effects. You may report side effects to FDA at 1-800-FDA-1088. Where should I keep my medication? This medication is given in a hospital or clinic. It will not be stored at home. NOTE:  This sheet is a summary. It may not cover all possible information. If you have questions about this medicine, talk to your doctor, pharmacist, or health care provider.  2024 Elsevier/Gold Standard (2021-06-01 00:00:00)       To help prevent nausea and vomiting after your treatment, we encourage you to take your nausea medication as directed.  BELOW ARE SYMPTOMS THAT SHOULD BE REPORTED IMMEDIATELY: *FEVER GREATER THAN 100.4 F (38 C) OR HIGHER *CHILLS OR  SWEATING *NAUSEA AND VOMITING THAT IS NOT CONTROLLED WITH YOUR NAUSEA MEDICATION *UNUSUAL SHORTNESS OF BREATH *UNUSUAL BRUISING OR BLEEDING *URINARY PROBLEMS (pain or burning when urinating, or frequent urination) *BOWEL PROBLEMS (unusual diarrhea, constipation, pain near the anus) TENDERNESS IN MOUTH AND THROAT WITH OR WITHOUT PRESENCE OF ULCERS (sore throat, sores in mouth, or a toothache) UNUSUAL RASH, SWELLING OR PAIN  UNUSUAL VAGINAL DISCHARGE OR ITCHING   Items with * indicate a potential emergency and should be followed up as soon as possible or go to the Emergency Department if any problems should occur.  Please show the CHEMOTHERAPY ALERT CARD or IMMUNOTHERAPY ALERT CARD at check-in to the Emergency Department and triage nurse.  Should you have questions after your visit or need to cancel or reschedule your appointment, please contact [facility] - A DEPT OF [facility] [phone]  and follow the prompts.  Office hours are 8:00 a.m. to 4:30 p.m. Monday - Friday. Please note that voicemails left after 4:00 p.m. may not be returned until the following business day.  We are closed weekends and major holidays. You have access to a nurse at all times for urgent questions. Please call the main number to the clinic [phone] and follow the prompts.  For any non-urgent questions, you may also contact your provider using MyChart. We now offer e-Visits for anyone 56 and older to request care online for non-urgent symptoms. For details visit [email or web address].   Also download the MyChart app! Go to the app store, search "MyChart", open the app, select [place], and log in with your MyChart username and password.

## 2023-01-02 ENCOUNTER — Ambulatory Visit: Payer: Medicaid Other

## 2023-01-02 ENCOUNTER — Inpatient Hospital Stay: Payer: Medicaid Other

## 2023-01-04 ENCOUNTER — Inpatient Hospital Stay: Payer: Medicaid Other

## 2023-01-10 ENCOUNTER — Encounter (HOSPITAL_COMMUNITY): Payer: Self-pay | Admitting: Radiology

## 2023-01-10 ENCOUNTER — Ambulatory Visit (HOSPITAL_COMMUNITY)
Admission: RE | Admit: 2023-01-10 | Discharge: 2023-01-10 | Disposition: A | Discharge status: Home / Self Care | Payer: Medicaid Other | Source: Ambulatory Visit | Attending: Hematology | Admitting: Hematology

## 2023-01-10 DIAGNOSIS — C189 Malignant neoplasm of colon, unspecified: Secondary | ICD-10-CM | POA: Insufficient documentation

## 2023-01-10 MED ORDER — IOHEXOL 300 MG/ML  SOLN
100.0000 mL | Freq: Once | INTRAMUSCULAR | Status: AC | PRN
  Administered 2023-01-10: 100 mL via INTRAVENOUS

## 2023-01-17 ENCOUNTER — Inpatient Hospital Stay: Payer: Medicaid Other

## 2023-01-17 MED FILL — Fluorouracil IV Soln 5 GM/100ML (50 MG/ML): INTRAVENOUS | Qty: 89 | Status: AC

## 2023-01-17 MED FILL — Fluorouracil IV Soln 2.5 GM/50ML (50 MG/ML): INTRAVENOUS | Qty: 15 | Status: AC
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
## 2023-01-18 ENCOUNTER — Inpatient Hospital Stay (HOSPITAL_BASED_OUTPATIENT_CLINIC_OR_DEPARTMENT_OTHER): Payer: Medicaid Other | Admitting: Hematology

## 2023-01-18 ENCOUNTER — Encounter (HOSPITAL_COMMUNITY): Payer: Self-pay | Admitting: Hematology

## 2023-01-18 ENCOUNTER — Inpatient Hospital Stay: Payer: Medicaid Other | Attending: Hematology

## 2023-01-18 ENCOUNTER — Encounter: Payer: Self-pay | Admitting: Hematology

## 2023-01-18 ENCOUNTER — Inpatient Hospital Stay: Payer: Medicaid Other

## 2023-01-18 VITALS — BP 162/92 | HR 86 | Temp 98.6°F | Resp 18

## 2023-01-18 DIAGNOSIS — Z793 Long term (current) use of hormonal contraceptives: Secondary | ICD-10-CM | POA: Diagnosis not present

## 2023-01-18 DIAGNOSIS — Z933 Colostomy status: Secondary | ICD-10-CM | POA: Insufficient documentation

## 2023-01-18 DIAGNOSIS — D509 Iron deficiency anemia, unspecified: Secondary | ICD-10-CM | POA: Insufficient documentation

## 2023-01-18 DIAGNOSIS — R21 Rash and other nonspecific skin eruption: Secondary | ICD-10-CM | POA: Diagnosis not present

## 2023-01-18 DIAGNOSIS — Z95828 Presence of other vascular implants and grafts: Secondary | ICD-10-CM

## 2023-01-18 DIAGNOSIS — Z79899 Other long term (current) drug therapy: Secondary | ICD-10-CM | POA: Insufficient documentation

## 2023-01-18 DIAGNOSIS — D259 Leiomyoma of uterus, unspecified: Secondary | ICD-10-CM | POA: Insufficient documentation

## 2023-01-18 DIAGNOSIS — G629 Polyneuropathy, unspecified: Secondary | ICD-10-CM | POA: Diagnosis not present

## 2023-01-18 DIAGNOSIS — K648 Other hemorrhoids: Secondary | ICD-10-CM | POA: Diagnosis not present

## 2023-01-18 DIAGNOSIS — C189 Malignant neoplasm of colon, unspecified: Secondary | ICD-10-CM | POA: Diagnosis not present

## 2023-01-18 DIAGNOSIS — Z5111 Encounter for antineoplastic chemotherapy: Secondary | ICD-10-CM | POA: Diagnosis present

## 2023-01-18 DIAGNOSIS — C187 Malignant neoplasm of sigmoid colon: Secondary | ICD-10-CM | POA: Insufficient documentation

## 2023-01-18 DIAGNOSIS — D649 Anemia, unspecified: Secondary | ICD-10-CM | POA: Insufficient documentation

## 2023-01-18 DIAGNOSIS — Z791 Long term (current) use of non-steroidal anti-inflammatories (NSAID): Secondary | ICD-10-CM | POA: Diagnosis not present

## 2023-01-18 DIAGNOSIS — Z809 Family history of malignant neoplasm, unspecified: Secondary | ICD-10-CM | POA: Insufficient documentation

## 2023-01-18 DIAGNOSIS — I119 Hypertensive heart disease without heart failure: Secondary | ICD-10-CM | POA: Insufficient documentation

## 2023-01-18 DIAGNOSIS — E876 Hypokalemia: Secondary | ICD-10-CM | POA: Insufficient documentation

## 2023-01-18 DIAGNOSIS — K56699 Other intestinal obstruction unspecified as to partial versus complete obstruction: Secondary | ICD-10-CM | POA: Diagnosis not present

## 2023-01-18 DIAGNOSIS — R188 Other ascites: Secondary | ICD-10-CM | POA: Diagnosis not present

## 2023-01-18 DIAGNOSIS — R599 Enlarged lymph nodes, unspecified: Secondary | ICD-10-CM | POA: Diagnosis not present

## 2023-01-18 LAB — CBC WITH DIFFERENTIAL/PLATELET
Abs Immature Granulocytes: 0.05 10*3/uL (ref 0.00–0.07)
Basophils Absolute: 0.1 10*3/uL (ref 0.0–0.1)
Basophils Relative: 1 %
Eosinophils Absolute: 0.1 10*3/uL (ref 0.0–0.5)
Eosinophils Relative: 1 %
HCT: 36.5 % (ref 36.0–46.0)
Hemoglobin: 12 g/dL (ref 12.0–15.0)
Immature Granulocytes: 1 %
Lymphocytes Relative: 30 %
Lymphs Abs: 2.3 10*3/uL (ref 0.7–4.0)
MCH: 30.8 pg (ref 26.0–34.0)
MCHC: 32.9 g/dL (ref 30.0–36.0)
MCV: 93.8 fL (ref 80.0–100.0)
Monocytes Absolute: 0.6 10*3/uL (ref 0.1–1.0)
Monocytes Relative: 7 %
Neutro Abs: 4.6 10*3/uL (ref 1.7–7.7)
Neutrophils Relative %: 60 %
Platelets: 257 10*3/uL (ref 150–400)
RBC: 3.89 MIL/uL (ref 3.87–5.11)
RDW: 14.9 % (ref 11.5–15.5)
WBC: 7.6 10*3/uL (ref 4.0–10.5)
nRBC: 0 % (ref 0.0–0.2)

## 2023-01-18 LAB — COMPREHENSIVE METABOLIC PANEL
ALT: 14 U/L (ref 0–44)
AST: 18 U/L (ref 15–41)
Albumin: 3.5 g/dL (ref 3.5–5.0)
Alkaline Phosphatase: 47 U/L (ref 38–126)
Anion gap: 9 (ref 5–15)
BUN: 8 mg/dL (ref 6–20)
CO2: 20 mmol/L — ABNORMAL LOW (ref 22–32)
Calcium: 8.6 mg/dL — ABNORMAL LOW (ref 8.9–10.3)
Chloride: 109 mmol/L (ref 98–111)
Creatinine, Ser: 0.81 mg/dL (ref 0.44–1.00)
GFR, Estimated: >60 mL/min (ref >60)
Glucose, Bld: 128 mg/dL — ABNORMAL HIGH (ref 70–99)
Potassium: 2.9 mmol/L — ABNORMAL LOW (ref 3.5–5.1)
Sodium: 138 mmol/L (ref 135–145)
Total Bilirubin: 0.6 mg/dL (ref <1.2)
Total Protein: 6.8 g/dL (ref 6.5–8.1)

## 2023-01-18 LAB — URINALYSIS, DIPSTICK ONLY
Bilirubin Urine: NEGATIVE
Glucose, UA: NEGATIVE mg/dL
Hgb urine dipstick: NEGATIVE
Ketones, ur: NEGATIVE mg/dL
Leukocytes,Ua: NEGATIVE
Nitrite: NEGATIVE
Protein, ur: NEGATIVE mg/dL
Specific Gravity, Urine: 1.014 (ref 1.005–1.030)
pH: 7 (ref 5.0–8.0)

## 2023-01-18 LAB — MAGNESIUM: Magnesium: 1.6 mg/dL — ABNORMAL LOW (ref 1.7–2.4)

## 2023-01-18 LAB — PREGNANCY, URINE: Preg Test, Ur: NEGATIVE

## 2023-01-18 MED ORDER — PALONOSETRON HCL INJECTION 0.25 MG/5ML
0.2500 mg | Freq: Once | INTRAVENOUS | Status: AC
  Administered 2023-01-18: 0.25 mg via INTRAVENOUS
  Filled 2023-01-18: qty 5

## 2023-01-18 MED ORDER — SODIUM CHLORIDE 0.9 % IV SOLN: 10.0000 mg | Freq: Once | INTRAVENOUS | Status: DC

## 2023-01-18 MED ORDER — DEXTROSE 5 % IV SOLN
INTRAVENOUS | Status: DC
Start: 2023-01-18 — End: 2023-01-18

## 2023-01-18 MED ORDER — FOSAPREPITANT DIMEGLUMINE INJECTION 150 MG
150.0000 mg | Freq: Once | INTRAVENOUS | Status: AC
  Administered 2023-01-18: 150 mg via INTRAVENOUS
  Filled 2023-01-18: qty 150

## 2023-01-18 MED ORDER — SODIUM CHLORIDE 0.9 % IV SOLN
INTRAVENOUS | Status: DC
Start: 2023-01-18 — End: 2023-01-18

## 2023-01-18 MED ORDER — DEXAMETHASONE SODIUM PHOSPHATE 10 MG/ML IJ SOLN
10.0000 mg | Freq: Once | INTRAMUSCULAR | Status: AC
  Administered 2023-01-18: 10 mg via INTRAVENOUS
  Filled 2023-01-18: qty 1

## 2023-01-18 MED ORDER — SODIUM CHLORIDE 0.9 % IV SOLN
2400.0000 mg/m2 | INTRAVENOUS | Status: DC
  Administered 2023-01-18: 5000 mg via INTRAVENOUS
  Filled 2023-01-18: qty 100

## 2023-01-18 MED ORDER — OXALIPLATIN CHEMO INJECTION 100 MG/20ML
85.0000 mg/m2 | Freq: Once | INTRAVENOUS | Status: AC
  Administered 2023-01-18: 170 mg via INTRAVENOUS
  Filled 2023-01-18: qty 34

## 2023-01-18 MED ORDER — LEUCOVORIN CALCIUM INJECTION 350 MG
400.0000 mg/m2 | Freq: Once | INTRAVENOUS | Status: AC
  Administered 2023-01-18: 804 mg via INTRAVENOUS
  Filled 2023-01-18: qty 40.2

## 2023-01-18 MED ORDER — SODIUM CHLORIDE 0.9% FLUSH
10.0000 mL | Freq: Once | INTRAVENOUS | Status: AC
  Administered 2023-01-18: 10 mL via INTRAVENOUS

## 2023-01-18 MED ORDER — FLUOROURACIL CHEMO INJECTION 2.5 GM/50ML
400.0000 mg/m2 | Freq: Once | INTRAVENOUS | Status: AC
Start: 2023-01-18 — End: 2023-01-18
  Administered 2023-01-18: 800 mg via INTRAVENOUS
  Filled 2023-01-18: qty 16

## 2023-01-18 MED ORDER — SODIUM CHLORIDE 0.9% FLUSH
10.0000 mL | INTRAVENOUS | Status: DC | PRN
Start: 2023-01-18 — End: 2023-01-18

## 2023-01-18 MED ORDER — POTASSIUM CHLORIDE CRYS ER 20 MEQ PO TBCR
40.0000 meq | EXTENDED_RELEASE_TABLET | Freq: Once | ORAL | Status: AC
Start: 2023-01-18 — End: 2023-01-18
  Administered 2023-01-18: 40 meq via ORAL
  Filled 2023-01-18: qty 2

## 2023-01-18 MED ORDER — SODIUM CHLORIDE 0.9 % IV SOLN
5.0000 mg/kg | Freq: Once | INTRAVENOUS | Status: AC
  Administered 2023-01-18: 450 mg via INTRAVENOUS
  Filled 2023-01-18: qty 16

## 2023-01-18 NOTE — Patient Instructions (Signed)
[facility] [place] - A DEPT OF [facility]  Discharge Instructions: Thank you for choosing [facility] to provide your oncology and hematology care.  If you have a lab appointment with the Cancer Center - please note that after April 8th, 2024, all labs will be drawn in the cancer center.  You do not have to check in or register with the main entrance as you have in the past but will complete your check-in in the cancer center.  Wear comfortable clothing and clothing appropriate for easy access to any Portacath or PICC line.   We strive to give you quality time with your provider. You may need to reschedule your appointment if you arrive late (15 or more minutes).  Arriving late affects you and other patients whose appointments are after yours.  Also, if you miss three or more appointments without notifying the office, you may be dismissed from the clinic at the provider's discretion.      For prescription refill requests, have your pharmacy contact our office and allow 72 hours for refills to be completed.    Today you received the following chemotherapy and/or immunotherapy agents FOLFOX and potassium chloride, return as scheduled.   To help prevent nausea and vomiting after your treatment, we encourage you to take your nausea medication as directed.  BELOW ARE SYMPTOMS THAT SHOULD BE REPORTED IMMEDIATELY: *FEVER GREATER THAN 100.4 F (38 C) OR HIGHER *CHILLS OR SWEATING *NAUSEA AND VOMITING THAT IS NOT CONTROLLED WITH YOUR NAUSEA MEDICATION *UNUSUAL SHORTNESS OF BREATH *UNUSUAL BRUISING OR BLEEDING *URINARY PROBLEMS (pain or burning when urinating, or frequent urination) *BOWEL PROBLEMS (unusual diarrhea, constipation, pain near the anus) TENDERNESS IN MOUTH AND THROAT WITH OR WITHOUT PRESENCE OF ULCERS (sore throat, sores in mouth, or a toothache) UNUSUAL RASH, SWELLING OR PAIN  UNUSUAL VAGINAL DISCHARGE OR ITCHING   Items with * indicate a  potential emergency and should be followed up as soon as possible or go to the Emergency Department if any problems should occur.  Please show the CHEMOTHERAPY ALERT CARD or IMMUNOTHERAPY ALERT CARD at check-in to the Emergency Department and triage nurse.  Should you have questions after your visit or need to cancel or reschedule your appointment, please contact [facility] [place] - A DEPT OF Eligha Bridegroom [facility] [phone]  and follow the prompts.  Office hours are 8:00 a.m. to 4:30 p.m. Monday - Friday. Please note that voicemails left after 4:00 p.m. may not be returned until the following business day.  We are closed weekends and major holidays. You have access to a nurse at all times for urgent questions. Please call the main number to the clinic [phone] and follow the prompts.  For any non-urgent questions, you may also contact your provider using MyChart. We now offer e-Visits for anyone 31 and older to request care online for non-urgent symptoms. For details visit [email or web address].   Also download the MyChart app! Go to the app store, search "MyChart", open the app, select [place], and log in with your MyChart username and password.

## 2023-01-18 NOTE — Patient Instructions (Signed)
[facility] Discharge Instructions   You were seen and examined today by Dr. Ellin Saba.  He reviewed the results of your lab work which are normal/stable.   He reviewed the results of your CT scan. 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 Dr. Kirtland Bouchard recommends changing treatments. We will switch out the irinotecan you have been receiving with oxaliplatin (you have had this in the past). We will also discontinue the Vectibix you have been receiving and switch it to an antibody called Avastin. This drug helps block the blood supply to the cancer cells.   We will proceed with your treatment today.   Return as scheduled.    Thank you for choosing [facility] at [facility] to provide your oncology and hematology care.  To afford each patient quality time with our provider, please arrive at least 15 minutes before your scheduled appointment time.   If you have a lab appointment with the Cancer Center please come in thru the Main Entrance and check in at the main information desk.  You need to re-schedule your appointment should you arrive 10 or more minutes late.  We strive to give you quality time with our providers, and arriving late affects you and other patients whose appointments are after yours.  Also, if you no show three or more times for appointments you may be dismissed from the clinic at the providers discretion.     Again, thank you for choosing [facility].  Our hope is that these requests will decrease the amount of time that you wait before being seen by our physicians.       _____________________________________________________________  Should you have questions after your visit to [facility], please contact our office at [phone] and follow the prompts.  Our office hours are 8:00 a.m. and 4:30 p.m. Monday - Friday.  Please note that voicemails left after 4:00 p.m. may not be returned until the  following business day.  We are closed weekends and major holidays.  You do have access to a nurse 24-7, just call the main number to the clinic [phone] and do not press any options, hold on the line and a nurse will answer the phone.    For prescription refill requests, have your pharmacy contact our office and allow 72 hours.    Due to Covid, you will need to wear a mask upon entering the hospital. If you do not have a mask, a mask will be given to you at the Main Entrance upon arrival. For doctor visits, patients may have 1 support person age 18 or older with them. For treatment visits, patients can not have anyone with them due to social distancing guidelines and our immunocompromised population.

## 2023-01-18 NOTE — Progress Notes (Signed)
May proceed with bevacizumab - no need to wait on Urine protein  T.O. Dr Carilyn Goodpasture, PharmD

## 2023-01-18 NOTE — Progress Notes (Signed)
DISCONTINUE ON PATHWAY REGIMEN - Colorectal     A cycle is every 14 days:     Panitumumab      Oxaliplatin      Leucovorin      Fluorouracil      Fluorouracil   **Always confirm dose/schedule in your pharmacy ordering system**  PRIOR TREATMENT: COS75: mFOLFOX + Panitumumab q14 Days  START ON PATHWAY REGIMEN - Colorectal     A cycle is every 14 days:     Bevacizumab-xxxx      Oxaliplatin      Leucovorin      Fluorouracil      Fluorouracil   **Always confirm dose/schedule in your pharmacy ordering system**  Patient Characteristics: Distant Metastases, Nonsurgical Candidate, KRAS/NRAS Wild-Type (BRAF 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

## 2023-01-18 NOTE — Progress Notes (Signed)
Patient's treatment switched to FOLFOX today per Dr. Ellin Saba, consent signed. Potassium Chloride 40 mEq given per standing orders. Patient tolerated chemotherapy with no complaints voiced. Side effects with management reviewed understanding verbalized. Port site clean and dry with no bruising or swelling noted at site. Good blood return noted before and after administration of chemotherapy. Chemo pump connected, return as scheduled. Patient left in satisfactory condition with VSS and no s/s of distress noted.

## 2023-01-18 NOTE — Progress Notes (Signed)
Pharmacist Chemotherapy Monitoring - Initial Assessment    Anticipated start date: 01/18/23   The following has been reviewed per standard work regarding the patient's treatment regimen: The patient's diagnosis, treatment plan and drug doses, and organ/hematologic function Lab orders and baseline tests specific to treatment regimen  The treatment plan start date, drug sequencing, and pre-medications Prior authorization status  Patient's documented medication list, including drug-drug interaction screen and prescriptions for anti-emetics and supportive care specific to the treatment regimen The drug concentrations, fluid compatibility, administration routes, and timing of the medications to be used The patient's access for treatment and lifetime cumulative dose history, if applicable  The patient's medication allergies and previous infusion related reactions, if applicable   Changes made to treatment plan:  pre-medications adding Emend back in due to excessive nausea/vomiting with all previous treatments.  Follow up needed:  N/A   Stephens Shire, [facility], 01/18/2023  10:47 AM

## 2023-01-19 ENCOUNTER — Other Ambulatory Visit: Payer: Self-pay

## 2023-01-19 ENCOUNTER — Inpatient Hospital Stay: Payer: Medicaid Other

## 2023-01-19 LAB — CEA: CEA: 45.7 ng/mL — ABNORMAL HIGH (ref 0.0–4.7)

## 2023-01-19 NOTE — Progress Notes (Signed)
13:30 pm 24 hour call back: Patient has no complaints other than diarrhea. Instructed patient to purchase Imodium and take 2 capsules after the first loose stool and then 1 capsule after each loose stool. Do not exceed 8 capsules in 24 hours. Patient voiced she has not yet purchased Imodium. Patient instructed to purchase Imodium. Understanding verbalized. Patient instructed to call the clinic with any questions or concerns. Patient eating and drinking fluids room temperature.

## 2023-01-20 ENCOUNTER — Inpatient Hospital Stay: Payer: Medicaid Other

## 2023-01-20 VITALS — BP 170/87 | HR 61 | Temp 97.6°F | Resp 18

## 2023-01-20 DIAGNOSIS — C189 Malignant neoplasm of colon, unspecified: Secondary | ICD-10-CM

## 2023-01-20 DIAGNOSIS — Z5111 Encounter for antineoplastic chemotherapy: Secondary | ICD-10-CM | POA: Diagnosis not present

## 2023-01-20 DIAGNOSIS — Z95828 Presence of other vascular implants and grafts: Secondary | ICD-10-CM

## 2023-01-20 MED ORDER — SODIUM CHLORIDE 0.9% FLUSH
10.0000 mL | INTRAVENOUS | Status: DC | PRN
  Administered 2023-01-20: 10 mL

## 2023-01-20 MED ORDER — HEPARIN SOD (PORK) LOCK FLUSH 100 UNIT/ML IV SOLN
500.0000 [IU] | Freq: Once | INTRAVENOUS | Status: AC | PRN
  Administered 2023-01-20: 500 [IU]

## 2023-01-20 NOTE — Patient Instructions (Signed)

## 2023-01-20 NOTE — Progress Notes (Signed)
Patients port flushed without difficulty.  Good blood return noted with no bruising or swelling noted at site.  Home infusion pump disconnected.  Band aid applied.  BP elevated at arrival.  Patient stated that since starting the new treatment per BP has been elevated.  Durenda Hurt, NP made aware.  Patient is asymptomatic.  No further action at this time per NP.  Patient left in satisfactory condition with no s/s of distress noted.

## 2023-02-02 ENCOUNTER — Other Ambulatory Visit: Payer: Medicaid Other

## 2023-02-02 ENCOUNTER — Inpatient Hospital Stay: Payer: Medicaid Other

## 2023-02-02 ENCOUNTER — Inpatient Hospital Stay: Payer: Medicaid Other | Admitting: Hematology
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
## 2023-02-15 ENCOUNTER — Inpatient Hospital Stay: Payer: Medicaid Other

## 2023-02-15 ENCOUNTER — Inpatient Hospital Stay: Payer: Medicaid Other | Attending: Hematology

## 2023-02-15 ENCOUNTER — Telehealth: Payer: Self-pay | Admitting: Gastroenterology

## 2023-02-15 ENCOUNTER — Inpatient Hospital Stay (HOSPITAL_BASED_OUTPATIENT_CLINIC_OR_DEPARTMENT_OTHER): Payer: Medicaid Other | Admitting: Hematology

## 2023-02-15 VITALS — BP 191/97

## 2023-02-15 VITALS — BP 150/86 | HR 91 | Temp 97.9°F | Resp 18 | Ht 65.0 in

## 2023-02-15 DIAGNOSIS — Z791 Long term (current) use of non-steroidal anti-inflammatories (NSAID): Secondary | ICD-10-CM | POA: Insufficient documentation

## 2023-02-15 DIAGNOSIS — C189 Malignant neoplasm of colon, unspecified: Secondary | ICD-10-CM

## 2023-02-15 DIAGNOSIS — E876 Hypokalemia: Secondary | ICD-10-CM | POA: Insufficient documentation

## 2023-02-15 DIAGNOSIS — R21 Rash and other nonspecific skin eruption: Secondary | ICD-10-CM | POA: Insufficient documentation

## 2023-02-15 DIAGNOSIS — Z793 Long term (current) use of hormonal contraceptives: Secondary | ICD-10-CM | POA: Insufficient documentation

## 2023-02-15 DIAGNOSIS — I10 Essential (primary) hypertension: Secondary | ICD-10-CM | POA: Diagnosis not present

## 2023-02-15 DIAGNOSIS — Z5111 Encounter for antineoplastic chemotherapy: Secondary | ICD-10-CM | POA: Insufficient documentation

## 2023-02-15 DIAGNOSIS — Z95828 Presence of other vascular implants and grafts: Secondary | ICD-10-CM

## 2023-02-15 DIAGNOSIS — C187 Malignant neoplasm of sigmoid colon: Secondary | ICD-10-CM | POA: Insufficient documentation

## 2023-02-15 DIAGNOSIS — G629 Polyneuropathy, unspecified: Secondary | ICD-10-CM | POA: Insufficient documentation

## 2023-02-15 LAB — CBC WITH DIFFERENTIAL/PLATELET
Abs Immature Granulocytes: 0.06 10*3/uL (ref 0.00–0.07)
Basophils Absolute: 0.1 10*3/uL (ref 0.0–0.1)
Basophils Relative: 1 %
Eosinophils Absolute: 0.1 10*3/uL (ref 0.0–0.5)
Eosinophils Relative: 1 %
HCT: 38.7 % (ref 36.0–46.0)
Hemoglobin: 12.9 g/dL (ref 12.0–15.0)
Immature Granulocytes: 1 %
Lymphocytes Relative: 31 %
Lymphs Abs: 2.9 10*3/uL (ref 0.7–4.0)
MCH: 30.6 pg (ref 26.0–34.0)
MCHC: 33.3 g/dL (ref 30.0–36.0)
MCV: 91.7 fL (ref 80.0–100.0)
Monocytes Absolute: 0.7 10*3/uL (ref 0.1–1.0)
Monocytes Relative: 8 %
Neutro Abs: 5.4 10*3/uL (ref 1.7–7.7)
Neutrophils Relative %: 58 %
Platelets: 264 10*3/uL (ref 150–400)
RBC: 4.22 MIL/uL (ref 3.87–5.11)
RDW: 14.1 % (ref 11.5–15.5)
WBC: 9.2 10*3/uL (ref 4.0–10.5)
nRBC: 0 % (ref 0.0–0.2)

## 2023-02-15 LAB — MAGNESIUM: Magnesium: 1.8 mg/dL (ref 1.7–2.4)

## 2023-02-15 LAB — COMPREHENSIVE METABOLIC PANEL
ALT: 11 U/L (ref 0–44)
AST: 20 U/L (ref 15–41)
Albumin: 3.6 g/dL (ref 3.5–5.0)
Alkaline Phosphatase: 55 U/L (ref 38–126)
Anion gap: 7 (ref 5–15)
BUN: 6 mg/dL (ref 6–20)
CO2: 21 mmol/L — ABNORMAL LOW (ref 22–32)
Calcium: 9 mg/dL (ref 8.9–10.3)
Chloride: 106 mmol/L (ref 98–111)
Creatinine, Ser: 0.69 mg/dL (ref 0.44–1.00)
GFR, Estimated: >60 mL/min (ref >60)
Glucose, Bld: 97 mg/dL (ref 70–99)
Potassium: 3.7 mmol/L (ref 3.5–5.1)
Sodium: 134 mmol/L — ABNORMAL LOW (ref 135–145)
Total Bilirubin: 0.5 mg/dL (ref 0.0–1.2)
Total Protein: 7.4 g/dL (ref 6.5–8.1)

## 2023-02-15 MED ORDER — SODIUM CHLORIDE 0.9% FLUSH
10.0000 mL | INTRAVENOUS | Status: DC | PRN
  Administered 2023-02-15: 10 mL via INTRAVENOUS

## 2023-02-15 MED ORDER — SODIUM CHLORIDE 0.9 % IV SOLN: INTRAVENOUS | Status: DC

## 2023-02-15 MED ORDER — DEXTROSE 5 % IV SOLN
INTRAVENOUS | Status: DC
Start: 2023-02-15 — End: 2023-02-21

## 2023-02-15 MED ORDER — SODIUM CHLORIDE 0.9 % IV SOLN
2400.0000 mg/m2 | INTRAVENOUS | Status: AC
Start: 2023-02-15 — End: 2023-02-17
  Administered 2023-02-15: 5000 mg via INTRAVENOUS
  Filled 2023-02-15: qty 100

## 2023-02-15 MED ORDER — DEXTROSE 5 % IV SOLN
85.0000 mg/m2 | Freq: Once | INTRAVENOUS | Status: AC
  Administered 2023-02-15: 170 mg via INTRAVENOUS
  Filled 2023-02-15: qty 34

## 2023-02-15 MED ORDER — FLUOROURACIL CHEMO INJECTION 2.5 GM/50ML
400.0000 mg/m2 | Freq: Once | INTRAVENOUS | Status: AC
Start: 2023-02-15 — End: 2023-02-15
  Administered 2023-02-15: 800 mg via INTRAVENOUS
  Filled 2023-02-15: qty 16

## 2023-02-15 MED ORDER — CLONIDINE HCL 0.1 MG PO TABS
0.2000 mg | ORAL_TABLET | Freq: Once | ORAL | Status: AC
Start: 2023-02-15 — End: 2023-02-15
  Administered 2023-02-15: 0.2 mg via ORAL
  Filled 2023-02-15: qty 2

## 2023-02-15 MED ORDER — AMLODIPINE BESYLATE 10 MG PO TABS: 10.0000 mg | ORAL_TABLET | Freq: Every day | ORAL | 4 refills | Status: DC

## 2023-02-15 MED ORDER — BEVACIZUMAB-ADCD CHEMO INJECTION 400 MG/16ML
5.0000 mg/kg | Freq: Once | INTRAVENOUS | Status: AC
  Administered 2023-02-15: 450 mg via INTRAVENOUS
  Filled 2023-02-15: qty 16

## 2023-02-15 MED ORDER — SODIUM CHLORIDE 0.9 % IV SOLN
150.0000 mg | Freq: Once | INTRAVENOUS | Status: AC
  Administered 2023-02-15: 150 mg via INTRAVENOUS
  Filled 2023-02-15: qty 150

## 2023-02-15 MED ORDER — PALONOSETRON HCL INJECTION 0.25 MG/5ML
0.2500 mg | Freq: Once | INTRAVENOUS | Status: AC
  Administered 2023-02-15: 0.25 mg via INTRAVENOUS
  Filled 2023-02-15: qty 5

## 2023-02-15 MED ORDER — SODIUM CHLORIDE 0.9% FLUSH
10.0000 mL | INTRAVENOUS | Status: DC | PRN
Start: 2023-02-15 — End: 2023-02-21

## 2023-02-15 MED ORDER — LEUCOVORIN CALCIUM INJECTION 350 MG
400.0000 mg/m2 | Freq: Once | INTRAVENOUS | Status: AC
  Administered 2023-02-15: 804 mg via INTRAVENOUS
  Filled 2023-02-15: qty 40.2

## 2023-02-15 MED ORDER — DEXAMETHASONE SODIUM PHOSPHATE 10 MG/ML IJ SOLN
10.0000 mg | Freq: Once | INTRAMUSCULAR | Status: AC
  Administered 2023-02-15: 10 mg via INTRAVENOUS
  Filled 2023-02-15: qty 1

## 2023-02-15 NOTE — Patient Instructions (Signed)
 [facility] [place] - A DEPT OF [facility]  Discharge Instructions: Thank you for choosing [facility] to provide your oncology and hematology care.  If you have a lab appointment with the Cancer Center - please note that after April 8th, 2024, all labs will be drawn in the cancer center.  You do not have to check in or register with the main entrance as you have in the past but will complete your check-in in the cancer center.  Wear comfortable clothing and clothing appropriate for easy access to any Portacath or PICC line.   We strive to give you quality time with your provider. You may need to reschedule your appointment if you arrive late (15 or more minutes).  Arriving late affects you and other patients whose appointments are after yours.  Also, if you miss three or more appointments without notifying the office, you may be dismissed from the clinic at the provider's discretion.      For prescription refill requests, have your pharmacy contact our office and allow 72 hours for refills to be completed.    Today you received the following chemotherapy and/or immunotherapy agents FOLFOX, return as scheduled.   To help prevent nausea and vomiting after your treatment, we encourage you to take your nausea medication as directed.  BELOW ARE SYMPTOMS THAT SHOULD BE REPORTED IMMEDIATELY: *FEVER GREATER THAN 100.4 F (38 C) OR HIGHER *CHILLS OR SWEATING *NAUSEA AND VOMITING THAT IS NOT CONTROLLED WITH YOUR NAUSEA MEDICATION *UNUSUAL SHORTNESS OF BREATH *UNUSUAL BRUISING OR BLEEDING *URINARY PROBLEMS (pain or burning when urinating, or frequent urination) *BOWEL PROBLEMS (unusual diarrhea, constipation, pain near the anus) TENDERNESS IN MOUTH AND THROAT WITH OR WITHOUT PRESENCE OF ULCERS (sore throat, sores in mouth, or a toothache) UNUSUAL RASH, SWELLING OR PAIN  UNUSUAL VAGINAL DISCHARGE OR ITCHING   Items with * indicate a potential emergency and  should be followed up as soon as possible or go to the Emergency Department if any problems should occur.  Please show the CHEMOTHERAPY ALERT CARD or IMMUNOTHERAPY ALERT CARD at check-in to the Emergency Department and triage nurse.  Should you have questions after your visit or need to cancel or reschedule your appointment, please contact [facility] [place] - A DEPT OF [facility] [phone]  and follow the prompts.  Office hours are 8:00 a.m. to 4:30 p.m. Monday - Friday. Please note that voicemails left after 4:00 p.m. may not be returned until the following business day.  We are closed weekends and major holidays. You have access to a nurse at all times for urgent questions. Please call the main number to the clinic [phone] and follow the prompts.  For any non-urgent questions, you may also contact your provider using MyChart. We now offer e-Visits for anyone 57 and older to request care online for non-urgent symptoms. For details visit [email or web address].   Also download the MyChart app! Go to the app store, search "MyChart", open the app, select [facility], and log in with your MyChart username and password.

## 2023-02-15 NOTE — Progress Notes (Signed)
 Patient has been examined by Dr. Rogers. Vital signs (BP 191/97) and labs have been reviewed by MD - ANC, Creatinine, LFTs, hemoglobin, and platelets are within treatment parameters per M.D. - pt may proceed with treatment. Give clonidine  0.2 mg po x 1 per MD. Primary RN and pharmacy notified.

## 2023-02-15 NOTE — Patient Instructions (Signed)

## 2023-02-15 NOTE — Progress Notes (Signed)
 Patient presents today for treatment. BP 174/100 and 191/96, per Dr. Rogers patient okay for treatment with Clonidine  0.2mg , pharmacy aware. Patient tolerated chemotherapy with no complaints voiced. Side effects with management reviewed understanding verbalized. Port site clean and dry with no bruising or swelling noted at site. Good blood return noted before and after administration of chemotherapy. Chemo pump connected with no alarms noted. Patient left in satisfactory condition with VSS and no s/s of distress noted.

## 2023-02-15 NOTE — Progress Notes (Signed)
 Patients port flushed without difficulty.  Good blood return noted with no bruising or swelling noted at site.  Patient remains accessed for treatment.

## 2023-02-15 NOTE — Telephone Encounter (Signed)
 Patient came into the office saying she has left 2 messages and no one has called her back.  She is having pressure in her bottom and wants to know what to do ... Should she make an appt?  Please advise

## 2023-02-15 NOTE — Telephone Encounter (Signed)
 Noted. Pt was made OV for 02/16/2023 for pressure in bottom

## 2023-02-15 NOTE — Progress Notes (Signed)
 GI Office Note    Referring Provider: Toribio Jerel MATSU, MD Primary Care Physician:  Toribio Jerel MATSU, MD Primary Gastroenterologist: Carlin POUR. Cindie, DO  Date:  02/16/2023  ID:  Jon Boers, DOB 05-19-1970, MRN [id number]   Chief Complaint   Chief Complaint  Patient presents with   Follow-up    Having rectal pressure   History of Present Illness  Alisan Dokes is a 53 y.o. 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  Receiving chemotherapy presenting today for evaluation of rectal discomfort.   OV 02/04/2021.  She presented for hospital follow-up for colitis and IDA.  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 She had elevated inflammatory markers and evidence of IDA. She had a couple days of diarrhea prior to admission and during hospitalization negative for infectious etiology.  She was given empiric antibiotics and discharged on Augmentin  with need to pursue outpatient endoscopy.  There was suspicion for underlying Crohn's disease but had no evidence of overt GI bleeding to explain her IDA. She was scheduled for colonoscopy and EGD with propofol , and advised to begin ferrous sulfate  325 mg daily.   Last colonoscopy and EGD 03/02/21. Colonoscopy 03/02/21: -non-bleeding internal hemorrhoids -stricture in the sigmoid colon s/p biopsy and tattooing -Sigmoid biopsy revealing adenocarcinoma, moderately to poorly differentiated.  -referred to general surgery for likely resection of sigmoid colon and need colonoscopy after resection  EGD 03/02/21: -gastritis s/p biopsy -normal duodenum -normal esophagus -Stomach biopsy consistent with mild chronic inflammation and changes consistent with reactive gastropathy, no H. pylori.    Patient underwent partial colectomy with small bowel resection, and ostomy placement in February. Had Port-A-Cath placed in March 2023. Receiving chemotherapy (FOLFOXIRI + Panitumumab  today).   Last office visit June 2023.  Having some loose stools, sometimes formed.  Also had some irritation around her stoma which was improving and following with wound ostomy clinic.  Not passing any stool per her rectum but was having mucus rectally but it resolved on its own.  Denied any abdominal pain, nausea, vomiting, or flatulence.  Energy level doing good.  Following with Dr. Rogers with oncology and receiving chemotherapy.  Had reported some weight loss since receiving chemo as she is unable to eat at times due to soreness in her mouth but using prescription mouth rinse at the time.  Advise colonoscopy via ostomy and flexible sigmoidoscopy with Dr. Cindie, advised getting clearance from Dr. POUR prior to scheduling.  Advised protein in her diet to help avoid weight loss.  Colonoscopy August 2023: -Sigmoid resection with Hetty pouch -Entire examined colon is normal -Advised repeat colonoscopy in 1 year for surveillance  CT A/P January 2024:  1. No evidence of colorectal carcinoma recurrence or metastasis inthe abdomen pelvis. 2. Stable small nodule in the root of the sigmoid mesocolon. 3. Post sigmoid colectomy with LEFT abdominal wall colostomy. 4. Leiomyomatous uterus.  Colonoscopy February 2024 with Dr. Gwynneth: -Normal cecum, ascending, transverse, descending, and sigmoid colon -Rectal mucosa appeared ischemic beginning at 5 cm -Advised follow-up with Dr. Debora  CT C/A/P 01/10/23 IMPRESSION: 1. Interval enlargement of a heterogeneous mixed solid and cystic mass arising from the right adnexa, consistent with worsened ovarian metastatic disease. 2. Interval enlargement of a soft tissue mass in the sigmoid mesentery consistent with worsened mesenteric soft tissue  or nodal metastatic  disease. 3. Unchanged, subtle nodularity in the left hemiabdomen. Small volume ascites. 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 4. Status post rectosigmoid colon resection with left lower quadrant end colostomy. 5. No evidence of metastatic disease in the chest. 6. Cardiomegaly. 7. Fibroid uterus.  OV with Dr. Rogers 01/18/2023.  Recent CEA 21.  Her CT was reviewed and he recommended change of chemotherapy.  Advised continue iron  tablets daily as her hemoglobin was stable at 12.  Given they are changing therapies they recommended discontinuing doxycycline  and clindamycin  gel.  Continue potassium 20 mEq 4 times daily. CEA 45.7 (21.2).   Last seen by Dr. Katragadda yesterday 02/15/2023.  Today:   No BRBPR. Having rectal pressure that is off and on and noticed it just before christmas. Feels like an urge to defecate. She does sit down and tries to see if anything will come out. Has not seen any mucous. She would have the mucus initially but never this rectal pressure.  Has not had any vaginal pressure or pain. No urinating symptoms. No abdominal pain.   No changes from her ostomy output. She was previously having gas last year but admits to eating beans at that time. Sometimes her stool is formed to where she needs to empty it.   She will be going to get her new prescription for BP medication given her new chemo. She had a headache just prior to her chemo yesterday.   Has good appetite and energy.   Wt Readings from Last 3 Encounters:  02/16/23 191 lb 9.6 oz (86.9 kg)  02/15/23 193 lb 8 oz (87.8 kg)  01/18/23 195 lb 9.6 oz (88.7 kg)   Current Outpatient Medications  Medication Sig Dispense Refill   amLODipine  (NORVASC ) 10 MG tablet Take 1 tablet (10 mg total) by mouth daily. 30 tablet 4   fexofenadine  (ALLEGRA ) 180 MG tablet Take 1 tablet (180 mg total) by mouth daily. 30 tablet 0   fluorouracil  CALGB 19297 2,400 mg/m2 in sodium chloride  0.9 % 150 mL Inject 2,400 mg/m2  into the vein over 48 hr. Every 14 days     FLUOROURACIL  IV Inject into the vein every 14 (fourteen) days.     fluticasone  (FLONASE ) 50 MCG/ACT nasal spray Place 2 sprays into both nostrils daily. 16 g 12   KLOR-CON  M20 20 MEQ tablet TAKE 1 TABLET (20 MEQ TOTAL) BY MOUTH IN THE MORNING, AT NOON, IN THE EVENING, AND AT BEDTIME. 360 tablet 1   magnesium  oxide (MAG-OX) 400 (240 Mg) MG tablet Take 1 tablet (400 mg total) by mouth 2 (two) times daily. 60 tablet 6   medroxyPROGESTERone (DEPO-PROVERA) 150 MG/ML injection Inject 150 mg into the muscle every 3 (three) months.     Multiple Vitamin (MULTIVITAMIN) tablet Take 1 tablet by mouth daily.     OXALIPLATIN  IV Inject into the vein every 14 (fourteen) days.     Panitumumab  (VECTIBIX  IV) Inject into the vein every 14 (fourteen) days.     pantoprazole  (PROTONIX ) 40 MG tablet Take 40 mg by mouth daily as needed (acid reflux).     No current facility-administered medications for this visit.   Facility-Administered Medications Ordered in Other Visits  Medication Dose Route Frequency Provider Last Rate Last Admin   0.9 %  sodium chloride  infusion   Intravenous Continuous Katragadda, Sreedhar, MD   Stopped at 02/15/23 1148   dextrose  5 % solution   Intravenous Continuous Katragadda, Sreedhar, MD   Stopped at 02/15/23 1154  fluorouracil  (ADRUCIL ) 5,000 mg in sodium chloride  0.9 % 150 mL chemo infusion  2,400 mg/m2 (Order-Specific) Intravenous 1 day or 1 dose Katragadda, Sreedhar, MD   Infusion Verify at 02/15/23 1526   sodium chloride  flush (NS) 0.9 % injection 10 mL  10 mL Intracatheter PRN Rogers Hai, MD        Past Medical History:  Diagnosis Date   Anemia    Back pain    Hypertension    Port-A-Cath in place 05/10/2021    Past Surgical History:  Procedure Laterality Date   BIOPSY  03/02/2021   Procedure: BIOPSY;  Surgeon: Cindie Carlin POUR, DO;  [facility];  Service: Endoscopy;;   BOWEL RESECTION N/A 03/26/2021    Procedure: SMALL BOWEL RESECTION;  Surgeon: Mavis Anes, MD;  Location: [facility];  Service: General;  Laterality: N/A;   CHOLECYSTECTOMY     COLONOSCOPY WITH PROPOFOL  N/A 10/04/2021   Procedure: COLONOSCOPY WITH PROPOFOL ;  Surgeon: Cindie Carlin POUR, DO;  [facility]: [facility];  Service: Endoscopy;  Laterality: N/A;  12:15PM, VIA OSTOMY   COLOSTOMY N/A 03/26/2021   Procedure: COLOSTOMY;  Surgeon: Mavis Anes, MD;  Location: [facility];  Service: General;  Laterality: N/A;   ESOPHAGOGASTRODUODENOSCOPY (EGD) WITH PROPOFOL  N/A 03/02/2021   Procedure: ESOPHAGOGASTRODUODENOSCOPY (EGD) WITH PROPOFOL ;  Surgeon: Cindie Carlin POUR, DO;  [facility];  Service: Endoscopy;  Laterality: N/A;   PARTIAL COLECTOMY N/A 03/26/2021   Procedure: PARTIAL COLECTOMY;  Surgeon: Mavis Anes, MD;  Location: [facility];  Service: General;  Laterality: N/A;   PORTACATH PLACEMENT Left 04/14/2021   Procedure: INSERTION PORT-A-CATH;  Surgeon: Mavis Anes, MD;  Location: [facility];  Service: General;  Laterality: Left;   SCLEROTHERAPY  03/02/2021   Procedure: MATIAS;  Surgeon: Cindie Carlin POUR, DO;  [facility];  Service: Endoscopy;;   SIGMOIDOSCOPY  03/02/2021   Procedure: KINGSTON;  Surgeon: Cindie Carlin POUR, DO;  [facility];  Service: Endoscopy;;    Family History  Problem Relation Age of Onset   Hypertension Mother    Hypertension Father    Colon cancer Neg Hx    Colon polyps Neg Hx    Inflammatory bowel disease Neg Hx     Allergies as of 02/16/2023 - Review Complete 02/16/2023  Allergen Reaction Noted   Zithromax  [azithromycin ] Hives 01/12/2021   Motrin [ibuprofen] Itching 10/14/2014   Sudafed [pseudoephedrine  hcl] Other (See Comments) 02/04/2015   Zestril [lisinopril] Cough 09/02/2013    Social History   Socioeconomic History   Marital status: Married    Spouse name: Not on file   Number of children: Not on file   Years of education: Not on file   Highest  education level: Not on file  Occupational History   Not on file  Tobacco Use   Smoking status: Never   Smokeless tobacco: Never  Vaping Use   Vaping status: Never Used  Substance and Sexual Activity   Alcohol use: No   Drug use: No   Sexual activity: Not on file  Other Topics Concern   Not on file  Social History Narrative   Not on file   Social Drivers of Health   Financial Resource Strain: Low Risk  (05/06/2022)   Received [facility], [facility]   Overall Financial Resource Strain (CARDIA)    Difficulty of Paying Living Expenses: Not very hard  Food Insecurity: Food Insecurity Present (05/06/2022)   Received from [facility], [facility]  Hunger Vital Sign    Worried About Running Out of Food in the Last Year: Sometimes true    Ran Out of Food in the Last Year: Never true  Transportation Needs: No Transportation Needs (05/06/2022)   Received from [facility], [facility]   [facility] - Transportation    Lack of Transportation (Medical): No    Lack of Transportation (Non-Medical): No  Physical Activity: Inactive (05/06/2022)   Received from [facility], [facility]   Exercise Vital Sign    Days of Exercise per Week: 0 days    Minutes of Exercise per Session: 0 min  Stress: Stress Concern Present (05/06/2022)   Received from [facility], [facility] of Occupational Health - Occupational Stress Questionnaire    Feeling of Stress : To some extent  Social Connections: Moderately Isolated (05/06/2022)   Received from [facility], [facility]   Social Connection and Isolation Panel [NHANES]    Frequency of Communication with Friends and Family: Three times a week    Frequency of Social Gatherings with Friends and Family: Never    Attends Religious Services: More than 4 times per year    Active Member of [employer] or Organizations: No    Attends Banker Meetings: Never    Marital Status: Divorced      Review of Systems   Gen: Denies fever, chills, anorexia. Denies fatigue, weakness, weight loss.  CV: Denies chest pain, palpitations, syncope, peripheral edema, and claudication. Resp: Denies dyspnea at rest, cough, wheezing, coughing up blood, and pleurisy. GI: See HPI Derm: Denies rash, itching, dry skin Psych: Denies depression, anxiety, memory loss, confusion. No homicidal or suicidal ideation.  Heme: Denies bruising, bleeding, and enlarged lymph nodes.  Physical Exam   BP (!) 175/93 (BP Location: Right Arm, Patient Position: Sitting, Cuff Size: Normal)   Pulse 89   Temp 98.4 F (36.9 C) (Oral)   Ht 5' 5 (1.651 m)   Wt 191 lb 9.6 oz (86.9 kg)   SpO2 98%   BMI 31.88 kg/m   General:   Alert and oriented. No distress noted. Pleasant and cooperative.  Head:  Normocephalic and atraumatic. Eyes:  Conjuctiva clear without scleral icterus. Mouth:  Oral mucosa pink and moist. Good dentition. No lesions. Abdomen: Soft, non-tender and non-distended. No rebound or guarding. No HSM or masses noted. Ostomy in place to LLQ with healthy pink and moist stoma with normal brown mushy stool present in bag.  Rectal: tight rectal tone. No presence of blood or melena. No overt mass with relaxation. When bearing down some mild firmness felt at tip of finger posteriorly (caudal at 12 o clock position -unable to assess further). No hemorrhoids palpated. Leonia Boom, CMA present for supervision).  Neurologic:  Alert and  oriented x4 Psych:  Alert and cooperative. Normal mood and affect.  Assessment  Gracia Saggese is a 53 y.o. 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  Receiving chemotherapy presenting today for evaluation of rectal discomfort.  Anemia, Stage IV Sigmoid adenocarcinoma, rectal discomfort: Recent hemoglobin stable at 12.  She was diagnosed with stage IV  sigmoid adenocarcinoma in January 2023 and is now status post sigmoidectomy with creation of Hartman's pouch and colostomy placement.  She has been undergoing regular chemotherapy and following with oncology regularly.  Has had follow-up colonoscopy and flexible  sigmoidoscopy in August 2023 and February 2024 without evidence of recurrent mass.  She has had progression of nodal metastasis on recent CT in early December.  Has been experiencing rectal pressure intermittently with urge to defecate for the last 3 weeks.  Given her history of malignancy and her most recent CT scan this is highly suspicious for potential malignancy causing symptoms.  She has not had any mucus production or rectal bleeding.  Appetite is good and no significant weight loss noted.  This could be solely sensory in nature however on rectal exam today some mild firmness was noted to the right posterior rectal column.  Will discuss with Dr. Rogers and Dr. Cindie about repeat imaging versus flexible sigmoidoscopy to ensure no recurrence of malignancy within the Hartman's pouch.  PLAN   Monitor for rectal pain, consistent pressure, and rectal bleeding. Relieve rectal pressure with toileting/relaxing anal sphincter Will discuss with Dr. Rogers and Dr. Cindie early interval imaging vs flexible sigmoidoscopy.  Follow-up TBD     Charmaine Melia, MSN, FNP-BC, AGACNP-BC [facility]

## 2023-02-16 ENCOUNTER — Encounter: Payer: Self-pay | Admitting: Gastroenterology

## 2023-02-16 ENCOUNTER — Ambulatory Visit (INDEPENDENT_AMBULATORY_CARE_PROVIDER_SITE_OTHER): Payer: Medicaid Other | Admitting: Gastroenterology

## 2023-02-16 ENCOUNTER — Encounter (HOSPITAL_COMMUNITY): Payer: Self-pay | Admitting: Hematology

## 2023-02-16 ENCOUNTER — Other Ambulatory Visit: Payer: Self-pay

## 2023-02-16 ENCOUNTER — Encounter: Payer: Self-pay | Admitting: Hematology

## 2023-02-16 VITALS — BP 175/93 | HR 89 | Temp 98.4°F | Ht 65.0 in | Wt 191.6 lb

## 2023-02-16 DIAGNOSIS — R198 Other specified symptoms and signs involving the digestive system and abdomen: Secondary | ICD-10-CM

## 2023-02-16 DIAGNOSIS — K6289 Other specified diseases of anus and rectum: Secondary | ICD-10-CM

## 2023-02-16 DIAGNOSIS — C187 Malignant neoplasm of sigmoid colon: Secondary | ICD-10-CM | POA: Diagnosis not present

## 2023-02-16 DIAGNOSIS — D649 Anemia, unspecified: Secondary | ICD-10-CM

## 2023-02-16 LAB — CEA: CEA: 130 ng/mL — ABNORMAL HIGH (ref 0.0–4.7)

## 2023-02-16 NOTE — Patient Instructions (Addendum)
 As the rectal pressure comes and goes continue to sit down and attempt to release the pressure that you feel. Mucus production if it occurs is normal.   Please let me know if you begin to see any blood coming from your rectum or the pressure becomes constant.   I discussed with Dr. Rogers and Dr. Cindie regarding flexible sigmoidoscopy versus repeat imaging and we will reach out with recommendations.  It was a pleasure to see you today. I want to create trusting relationships with patients. If you receive a survey regarding your visit,  I greatly appreciate you taking time to fill this out on paper or through your MyChart. I value your feedback.  Charmaine Melia, MSN, FNP-BC, AGACNP-BC [facility]

## 2023-02-17 ENCOUNTER — Inpatient Hospital Stay: Payer: Medicaid Other

## 2023-02-17 VITALS — BP 157/89 | HR 86 | Temp 97.5°F | Resp 18

## 2023-02-17 DIAGNOSIS — Z5111 Encounter for antineoplastic chemotherapy: Secondary | ICD-10-CM | POA: Diagnosis not present

## 2023-02-17 DIAGNOSIS — C189 Malignant neoplasm of colon, unspecified: Secondary | ICD-10-CM

## 2023-02-17 DIAGNOSIS — Z95828 Presence of other vascular implants and grafts: Secondary | ICD-10-CM

## 2023-02-17 MED ORDER — HEPARIN SOD (PORK) LOCK FLUSH 100 UNIT/ML IV SOLN
500.0000 [IU] | Freq: Once | INTRAVENOUS | Status: AC | PRN
Start: 2023-02-17 — End: 2023-02-17
  Administered 2023-02-17: 500 [IU]

## 2023-02-17 MED ORDER — SODIUM CHLORIDE 0.9% FLUSH
10.0000 mL | INTRAVENOUS | Status: DC | PRN
  Administered 2023-02-17: 10 mL

## 2023-02-17 NOTE — Patient Instructions (Signed)
 [facility] [place] - A DEPT OF [facility]  Discharge Instructions: Thank you for choosing [facility] to provide your oncology and hematology care.  If you have a lab appointment with the Cancer Center - please note that after April 8th, 2024, all labs will be drawn in the cancer center.  You do not have to check in or register with the main entrance as you have in the past but will complete your check-in in the cancer center.  Wear comfortable clothing and clothing appropriate for easy access to any Portacath or PICC line.   We strive to give you quality time with your provider. You may need to reschedule your appointment if you arrive late (15 or more minutes).  Arriving late affects you and other patients whose appointments are after yours.  Also, if you miss three or more appointments without notifying the office, you may be dismissed from the clinic at the provider's discretion.      For prescription refill requests, have your pharmacy contact our office and allow 72 hours for refills to be completed.    Today you received the following chemotherapy and/or immunotherapy agents Adrucil. Fluorouracil Injection What is this medication? FLUOROURACIL (flure oh YOOR a sil) treats some types of cancer. It works by slowing down the growth of cancer cells. This medicine may be used for other purposes; ask your health care provider or pharmacist if you have questions. COMMON BRAND NAME(S): Adrucil What should I tell my care team before I take this medication? They need to know if you have any of these conditions: Blood disorders Dihydropyrimidine dehydrogenase (DPD) deficiency Infection, such as chickenpox, cold sores, herpes Kidney disease Liver disease Poor nutrition Recent or ongoing radiation therapy An unusual or allergic reaction to fluorouracil, other medications, foods, dyes, or preservatives If you or your partner are pregnant or trying to get  pregnant Breast-feeding How should I use this medication? This medication is injected into a vein. It is administered by your care team in a hospital or clinic setting. Talk to your care team about the use of this medication in children. Special care may be needed. Overdosage: If you think you have taken too much of this medicine contact a poison control center or emergency room at once. NOTE: This medicine is only for you. Do not share this medicine with others. What if I miss a dose? Keep appointments for follow-up doses. It is important not to miss your dose. Call your care team if you are unable to keep an appointment. What may interact with this medication? Do not take this medication with any of the following: Live virus vaccines This medication may also interact with the following: Medications that treat or prevent blood clots, such as warfarin, enoxaparin, dalteparin This list may not describe all possible interactions. Give your health care provider a list of all the medicines, herbs, non-prescription drugs, or dietary supplements you use. Also tell them if you smoke, drink alcohol, or use illegal drugs. Some items may interact with your medicine. What should I watch for while using this medication? Your condition will be monitored carefully while you are receiving this medication. This medication may make you feel generally unwell. This is not uncommon as chemotherapy can affect healthy cells as well as cancer cells. Report any side effects. Continue your course of treatment even though you feel ill unless your care team tells you to stop. In some cases, you may be given additional  medications to help with side effects. Follow all directions for their use. This medication may increase your risk of getting an infection. Call your care team for advice if you get a fever, chills, sore throat, or other symptoms of a cold or flu. Do not treat yourself. Try to avoid being around people who are  sick. This medication may increase your risk to bruise or bleed. Call your care team if you notice any unusual bleeding. Be careful brushing or flossing your teeth or using a toothpick because you may get an infection or bleed more easily. If you have any dental work done, tell your dentist you are receiving this medication. Avoid taking medications that contain aspirin, acetaminophen, ibuprofen, naproxen, or ketoprofen unless instructed by your care team. These medications may hide a fever. Do not treat diarrhea with over the counter products. Contact your care team if you have diarrhea that lasts more than 2 days or if it is severe and watery. This medication can make you more sensitive to the sun. Keep out of the sun. If you cannot avoid being in the sun, wear protective clothing and sunscreen. Do not use sun lamps, tanning beds, or tanning booths. Talk to your care team if you or your partner wish to become pregnant or think you might be pregnant. This medication can cause serious birth defects if taken during pregnancy and for 3 months after the last dose. A reliable form of contraception is recommended while taking this medication and for 3 months after the last dose. Talk to your care team about effective forms of contraception. Do not father a child while taking this medication and for 3 months after the last dose. Use a condom while having sex during this time period. Do not breastfeed while taking this medication. This medication may cause infertility. Talk to your care team if you are concerned about your fertility. What side effects may I notice from receiving this medication? Side effects that you should report to your care team as soon as possible: Allergic reactions--skin rash, itching, hives, swelling of the face, lips, tongue, or throat Heart attack--pain or tightness in the chest, shoulders, arms, or jaw, nausea, shortness of breath, cold or clammy skin, feeling faint or  lightheaded Heart failure--shortness of breath, swelling of the ankles, feet, or hands, sudden weight gain, unusual weakness or fatigue Heart rhythm changes--fast or irregular heartbeat, dizziness, feeling faint or lightheaded, chest pain, trouble breathing High ammonia level--unusual weakness or fatigue, confusion, loss of appetite, nausea, vomiting, seizures Infection--fever, chills, cough, sore throat, wounds that don't heal, pain or trouble when passing urine, general feeling of discomfort or being unwell Low red blood cell level--unusual weakness or fatigue, dizziness, headache, trouble breathing Pain, tingling, or numbness in the hands or feet, muscle weakness, change in vision, confusion or trouble speaking, loss of balance or coordination, trouble walking, seizures Redness, swelling, and blistering of the skin over hands and feet Severe or prolonged diarrhea Unusual bruising or bleeding Side effects that usually do not require medical attention (report to your care team if they continue or are bothersome): Dry skin Headache Increased tears Nausea Pain, redness, or swelling with sores inside the mouth or throat Sensitivity to light Vomiting This list may not describe all possible side effects. Call your doctor for medical advice about side effects. You may report side effects to FDA at 1-800-FDA-1088. Where should I keep my medication? This medication is given in a hospital or clinic. It will not be stored at home.  NOTE: This sheet is a summary. It may not cover all possible information. If you have questions about this medicine, talk to your doctor, pharmacist, or health care provider.  2024 Elsevier/Gold Standard (2021-06-01 00:00:00)      To help prevent nausea and vomiting after your treatment, we encourage you to take your nausea medication as directed.  BELOW ARE SYMPTOMS THAT SHOULD BE REPORTED IMMEDIATELY: *FEVER GREATER THAN 100.4 F (38 C) OR HIGHER *CHILLS OR  SWEATING *NAUSEA AND VOMITING THAT IS NOT CONTROLLED WITH YOUR NAUSEA MEDICATION *UNUSUAL SHORTNESS OF BREATH *UNUSUAL BRUISING OR BLEEDING *URINARY PROBLEMS (pain or burning when urinating, or frequent urination) *BOWEL PROBLEMS (unusual diarrhea, constipation, pain near the anus) TENDERNESS IN MOUTH AND THROAT WITH OR WITHOUT PRESENCE OF ULCERS (sore throat, sores in mouth, or a toothache) UNUSUAL RASH, SWELLING OR PAIN  UNUSUAL VAGINAL DISCHARGE OR ITCHING   Items with * indicate a potential emergency and should be followed up as soon as possible or go to the Emergency Department if any problems should occur.  Please show the CHEMOTHERAPY ALERT CARD or IMMUNOTHERAPY ALERT CARD at check-in to the Emergency Department and triage nurse.  Should you have questions after your visit or need to cancel or reschedule your appointment, please contact [facility] [place] - A DEPT OF [facility]igh[facility] Brideg[facility] [phone]  and follow the prompts.  Office hours are 8:00 a.m. to 4:30 p.m. Monday - Friday. Please note that voicemails left after 4:00 p.m. may not be returned until the following business day.  We are closed weekends and major holidays. You have access to a nurse at all times for urgent questions. Please call the main number to the clinic [phone] and follow the prompts.  For any non-urgent questions, you may also contact your provider using MyChart. We now offer e-Visits for anyone 74 and older to request care online for non-urgent symptoms. For details visit [email or web address].   Also download the MyChart app! Go to the app store, search "MyChart", open the app, select [place], and log in with your MyChart username and password.

## 2023-02-17 NOTE — Progress Notes (Signed)
Patient presents today for pump d/c. Vital signs are stable. Port a cath site clean, dry, and intact. Port flushed with 10 mls of Normal Saline and 500 Units of Heparin. Needle removed intact. Band aid applied. Patient has no complaints at this time. Discharged from clinic ambulatory and in stable condition. Patient alert and oriented.  

## 2023-02-21 ENCOUNTER — Encounter: Payer: Self-pay | Admitting: Hematology

## 2023-02-21 ENCOUNTER — Encounter (HOSPITAL_COMMUNITY): Payer: Self-pay | Admitting: Hematology

## 2023-02-28 ENCOUNTER — Other Ambulatory Visit: Payer: Self-pay

## 2023-03-01 ENCOUNTER — Inpatient Hospital Stay: Payer: Medicaid Other

## 2023-03-01 ENCOUNTER — Inpatient Hospital Stay: Payer: Medicaid Other | Admitting: Hematology

## 2023-03-01 VITALS — BP 154/83 | HR 99 | Temp 98.0°F | Resp 18

## 2023-03-01 DIAGNOSIS — Z95828 Presence of other vascular implants and grafts: Secondary | ICD-10-CM

## 2023-03-01 DIAGNOSIS — Z5111 Encounter for antineoplastic chemotherapy: Secondary | ICD-10-CM | POA: Diagnosis not present

## 2023-03-01 DIAGNOSIS — C189 Malignant neoplasm of colon, unspecified: Secondary | ICD-10-CM

## 2023-03-01 LAB — URINALYSIS, DIPSTICK ONLY
Bilirubin Urine: NEGATIVE
Glucose, UA: NEGATIVE mg/dL
Ketones, ur: NEGATIVE mg/dL
Leukocytes,Ua: NEGATIVE
Nitrite: NEGATIVE
Protein, ur: 30 mg/dL — AB
Specific Gravity, Urine: 1.018 (ref 1.005–1.030)
pH: 5 (ref 5.0–8.0)

## 2023-03-01 LAB — CBC WITH DIFFERENTIAL/PLATELET
Abs Immature Granulocytes: 0.01 10*3/uL (ref 0.00–0.07)
Basophils Absolute: 0 10*3/uL (ref 0.0–0.1)
Basophils Relative: 0 %
Eosinophils Absolute: 0.1 10*3/uL (ref 0.0–0.5)
Eosinophils Relative: 1 %
HCT: 33.5 % — ABNORMAL LOW (ref 36.0–46.0)
Hemoglobin: 11.4 g/dL — ABNORMAL LOW (ref 12.0–15.0)
Immature Granulocytes: 0 %
Lymphocytes Relative: 41 %
Lymphs Abs: 1.9 10*3/uL (ref 0.7–4.0)
MCH: 30.2 pg (ref 26.0–34.0)
MCHC: 34 g/dL (ref 30.0–36.0)
MCV: 88.9 fL (ref 80.0–100.0)
Monocytes Absolute: 0.5 10*3/uL (ref 0.1–1.0)
Monocytes Relative: 12 %
Neutro Abs: 2 10*3/uL (ref 1.7–7.7)
Neutrophils Relative %: 46 %
Platelets: 221 10*3/uL (ref 150–400)
RBC: 3.77 MIL/uL — ABNORMAL LOW (ref 3.87–5.11)
RDW: 13.9 % (ref 11.5–15.5)
WBC: 4.5 10*3/uL (ref 4.0–10.5)
nRBC: 0 % (ref 0.0–0.2)

## 2023-03-01 LAB — COMPREHENSIVE METABOLIC PANEL
ALT: 15 U/L (ref 0–44)
AST: 20 U/L (ref 15–41)
Albumin: 3.5 g/dL (ref 3.5–5.0)
Alkaline Phosphatase: 60 U/L (ref 38–126)
Anion gap: 7 (ref 5–15)
BUN: 7 mg/dL (ref 6–20)
CO2: 22 mmol/L (ref 22–32)
Calcium: 8.6 mg/dL — ABNORMAL LOW (ref 8.9–10.3)
Chloride: 108 mmol/L (ref 98–111)
Creatinine, Ser: 0.66 mg/dL (ref 0.44–1.00)
GFR, Estimated: >60 mL/min (ref >60)
Glucose, Bld: 116 mg/dL — ABNORMAL HIGH (ref 70–99)
Potassium: 3.2 mmol/L — ABNORMAL LOW (ref 3.5–5.1)
Sodium: 137 mmol/L (ref 135–145)
Total Bilirubin: 0.3 mg/dL (ref 0.0–1.2)
Total Protein: 6.6 g/dL (ref 6.5–8.1)

## 2023-03-01 LAB — MAGNESIUM: Magnesium: 1.6 mg/dL — ABNORMAL LOW (ref 1.7–2.4)

## 2023-03-01 MED ORDER — FOSAPREPITANT DIMEGLUMINE INJECTION 150 MG
150.0000 mg | Freq: Once | INTRAVENOUS | Status: AC
  Administered 2023-03-01: 150 mg via INTRAVENOUS
  Filled 2023-03-01: qty 5

## 2023-03-01 MED ORDER — OXALIPLATIN CHEMO INJECTION 100 MG/20ML
85.0000 mg/m2 | Freq: Once | INTRAVENOUS | Status: AC
  Administered 2023-03-01: 170 mg via INTRAVENOUS
  Filled 2023-03-01: qty 34

## 2023-03-01 MED ORDER — SODIUM CHLORIDE 0.9% FLUSH
10.0000 mL | INTRAVENOUS | Status: AC
  Administered 2023-03-01: 10 mL

## 2023-03-01 MED ORDER — DEXAMETHASONE SODIUM PHOSPHATE 10 MG/ML IJ SOLN
10.0000 mg | Freq: Once | INTRAMUSCULAR | Status: AC
  Administered 2023-03-01: 10 mg via INTRAVENOUS
  Filled 2023-03-01: qty 1

## 2023-03-01 MED ORDER — SODIUM CHLORIDE 0.9 % IV SOLN
5.0000 mg/kg | Freq: Once | INTRAVENOUS | Status: AC
  Administered 2023-03-01: 450 mg via INTRAVENOUS
  Filled 2023-03-01: qty 16

## 2023-03-01 MED ORDER — SODIUM CHLORIDE 0.9 % IV SOLN
2400.0000 mg/m2 | INTRAVENOUS | Status: DC
  Administered 2023-03-01: 5000 mg via INTRAVENOUS
  Filled 2023-03-01: qty 100

## 2023-03-01 MED ORDER — SODIUM CHLORIDE 0.9 % IV SOLN
INTRAVENOUS | Status: DC
Start: 2023-03-01 — End: 2023-03-01

## 2023-03-01 MED ORDER — DEXTROSE 5 % IV SOLN
INTRAVENOUS | Status: DC
Start: 2023-03-01 — End: 2023-03-01

## 2023-03-01 MED ORDER — MAGNESIUM SULFATE 2 GM/50ML IV SOLN
2.0000 g | Freq: Once | INTRAVENOUS | Status: AC
  Administered 2023-03-01: 2 g via INTRAVENOUS
  Filled 2023-03-01: qty 50

## 2023-03-01 MED ORDER — DEXTROSE 5 % IV SOLN
400.0000 mg/m2 | Freq: Once | INTRAVENOUS | Status: AC
  Administered 2023-03-01: 804 mg via INTRAVENOUS
  Filled 2023-03-01: qty 40.2

## 2023-03-01 MED ORDER — DEXTROSE 5 % IV SOLN: INTRAVENOUS | Status: DC

## 2023-03-01 MED ORDER — PALONOSETRON HCL INJECTION 0.25 MG/5ML
0.2500 mg | Freq: Once | INTRAVENOUS | Status: AC
  Administered 2023-03-01: 0.25 mg via INTRAVENOUS
  Filled 2023-03-01: qty 5

## 2023-03-01 MED ORDER — FLUOROURACIL CHEMO INJECTION 2.5 GM/50ML
400.0000 mg/m2 | Freq: Once | INTRAVENOUS | Status: AC
  Administered 2023-03-01: 800 mg via INTRAVENOUS
  Filled 2023-03-01: qty 16

## 2023-03-01 NOTE — Progress Notes (Signed)
Patient tolerated chemotherapy with no complaints voiced.  Side effects with management reviewed with understanding verbalized.  Port site clean and dry with no bruising or swelling noted at site.  Good blood return noted before and after administration of chemotherapy. 5FU pump started for home use. Pt due to return to clinic for pump stop on 03/03/23. Patient left in satisfactory condition with VSS and no s/s of distress noted. All follow ups as scheduled.   Jasiri Hanawalt [employer]

## 2023-03-01 NOTE — Patient Instructions (Signed)
[facility] [place] - A DEPT [facility]  Discharge Instructions: Thank you for choosing [facility] to provide your oncology and hematology care.  If you have a lab appointment with the Cancer Center - please note that after April 8th, 2024, all labs will be drawn in the cancer center.  You do not have to check in or register with the main entrance as you have in the past but will complete your check-in in the cancer center.  Wear comfortable clothing and clothing appropriate for easy access to any Portacath or PICC line.   We strive to give you quality time with your provider. You may need to reschedule your appointment if you arrive late (15 or more minutes).  Arriving late affects you and other patients whose appointments are after yours.  Also, if you miss three or more appointments without notifying the office, you may be dismissed from the clinic at the provider's discretion.      For prescription refill requests, have your pharmacy contact our office and allow 72 hours for refills to be completed.    Today you received the following chemotherapy and/or immunotherapy agents avastin, oxaliplatin, leucovorin, adrucil    To help prevent nausea and vomiting after your treatment, we encourage you to take your nausea medication as directed.  BELOW ARE SYMPTOMS THAT SHOULD BE REPORTED IMMEDIATELY: *FEVER GREATER THAN 100.4 F (38 C) OR HIGHER *CHILLS OR SWEATING *NAUSEA AND VOMITING THAT IS NOT CONTROLLED WITH YOUR NAUSEA MEDICATION *UNUSUAL SHORTNESS OF BREATH *UNUSUAL BRUISING OR BLEEDING *URINARY PROBLEMS (pain or burning when urinating, or frequent urination) *BOWEL PROBLEMS (unusual diarrhea, constipation, pain near the anus) TENDERNESS IN MOUTH AND THROAT WITH OR WITHOUT PRESENCE OF ULCERS (sore throat, sores in mouth, or a toothache) UNUSUAL RASH, SWELLING OR PAIN  UNUSUAL VAGINAL DISCHARGE OR ITCHING   Items with * indicate a potential  emergency and should be followed up as soon as possible or go to the Emergency Department if any problems should occur.  Please show the CHEMOTHERAPY ALERT CARD or IMMUNOTHERAPY ALERT CARD at check-in to the Emergency Department and triage nurse.  Should you have questions after your visit or need to cancel or reschedule your appointment, please contact [facility] [place] - A DEPT OF [facility] [phone]  and follow the prompts.  Office hours are 8:00 a.m. to 4:30 p.m. Monday - Friday. Please note that voicemails left after 4:00 p.m. may not be returned until the following business day.  We are closed weekends and major holidays. You have access to a nurse at all times for urgent questions. Please call the main number to the clinic [phone] and follow the prompts.  For any non-urgent questions, you may also contact your provider using MyChart. We now offer e-Visits for anyone 45 and older to request care online for non-urgent symptoms. For details visit [email or web address].   Also download the MyChart app! Go to the app store, search "MyChart", open the app, select [place], and log in with your MyChart username and password.

## 2023-03-02 LAB — CEA: CEA: 205 ng/mL — ABNORMAL HIGH (ref 0.0–4.7)

## 2023-03-03 ENCOUNTER — Inpatient Hospital Stay: Payer: Medicaid Other

## 2023-03-03 VITALS — BP 158/97 | HR 93 | Temp 98.2°F | Resp 18

## 2023-03-03 DIAGNOSIS — C189 Malignant neoplasm of colon, unspecified: Secondary | ICD-10-CM

## 2023-03-03 DIAGNOSIS — Z5111 Encounter for antineoplastic chemotherapy: Secondary | ICD-10-CM | POA: Diagnosis not present

## 2023-03-03 DIAGNOSIS — Z95828 Presence of other vascular implants and grafts: Secondary | ICD-10-CM

## 2023-03-03 MED ORDER — HEPARIN SOD (PORK) LOCK FLUSH 100 UNIT/ML IV SOLN
500.0000 [IU] | Freq: Once | INTRAVENOUS | Status: AC | PRN
  Administered 2023-03-03: 500 [IU]

## 2023-03-03 MED ORDER — SODIUM CHLORIDE 0.9% FLUSH
10.0000 mL | INTRAVENOUS | Status: DC | PRN
  Administered 2023-03-03: 10 mL

## 2023-03-03 NOTE — Progress Notes (Signed)
Patient presents today for 5FU chemotherapy pump disconnection per provider's order. Vital signs stable and patient voiced no new complaints at this time.   Port flushed easily with 10 mL of normal saline and 5 mL of heparin. Good blood return noted and needle removed intact. No bruising or swelling noted at the site.  Discharged from clinic ambulatory in stable condition. Alert and oriented x 3. F/U with [facility] as scheduled.
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
## 2023-03-15 ENCOUNTER — Inpatient Hospital Stay: Payer: Medicaid Other | Attending: Hematology

## 2023-03-15 ENCOUNTER — Inpatient Hospital Stay: Payer: Medicaid Other

## 2023-03-15 ENCOUNTER — Encounter: Payer: Self-pay | Admitting: Hematology

## 2023-03-15 ENCOUNTER — Inpatient Hospital Stay (HOSPITAL_BASED_OUTPATIENT_CLINIC_OR_DEPARTMENT_OTHER): Payer: Medicaid Other | Admitting: Hematology

## 2023-03-15 VITALS — HR 93 | Wt 208.6 lb

## 2023-03-15 VITALS — BP 137/83 | HR 96 | Temp 98.4°F | Resp 18

## 2023-03-15 DIAGNOSIS — Z95828 Presence of other vascular implants and grafts: Secondary | ICD-10-CM

## 2023-03-15 DIAGNOSIS — Z793 Long term (current) use of hormonal contraceptives: Secondary | ICD-10-CM | POA: Diagnosis not present

## 2023-03-15 DIAGNOSIS — C189 Malignant neoplasm of colon, unspecified: Secondary | ICD-10-CM

## 2023-03-15 DIAGNOSIS — C7989 Secondary malignant neoplasm of other specified sites: Secondary | ICD-10-CM | POA: Diagnosis not present

## 2023-03-15 DIAGNOSIS — Z5111 Encounter for antineoplastic chemotherapy: Secondary | ICD-10-CM | POA: Diagnosis present

## 2023-03-15 DIAGNOSIS — C187 Malignant neoplasm of sigmoid colon: Secondary | ICD-10-CM | POA: Diagnosis not present

## 2023-03-15 DIAGNOSIS — Z79899 Other long term (current) drug therapy: Secondary | ICD-10-CM | POA: Insufficient documentation

## 2023-03-15 DIAGNOSIS — G629 Polyneuropathy, unspecified: Secondary | ICD-10-CM | POA: Diagnosis not present

## 2023-03-15 DIAGNOSIS — E876 Hypokalemia: Secondary | ICD-10-CM | POA: Diagnosis not present

## 2023-03-15 DIAGNOSIS — I10 Essential (primary) hypertension: Secondary | ICD-10-CM | POA: Diagnosis not present

## 2023-03-15 DIAGNOSIS — R599 Enlarged lymph nodes, unspecified: Secondary | ICD-10-CM | POA: Insufficient documentation

## 2023-03-15 LAB — COMPREHENSIVE METABOLIC PANEL
ALT: 13 U/L (ref 0–44)
AST: 15 U/L (ref 15–41)
Albumin: 3.5 g/dL (ref 3.5–5.0)
Alkaline Phosphatase: 58 U/L (ref 38–126)
Anion gap: 9 (ref 5–15)
BUN: 7 mg/dL (ref 6–20)
CO2: 19 mmol/L — ABNORMAL LOW (ref 22–32)
Calcium: 8.7 mg/dL — ABNORMAL LOW (ref 8.9–10.3)
Chloride: 109 mmol/L (ref 98–111)
Creatinine, Ser: 0.69 mg/dL (ref 0.44–1.00)
GFR, Estimated: >60 mL/min (ref >60)
Glucose, Bld: 103 mg/dL — ABNORMAL HIGH (ref 70–99)
Potassium: 3.7 mmol/L (ref 3.5–5.1)
Sodium: 137 mmol/L (ref 135–145)
Total Bilirubin: 0.6 mg/dL (ref 0.0–1.2)
Total Protein: 6.7 g/dL (ref 6.5–8.1)

## 2023-03-15 LAB — CBC WITH DIFFERENTIAL/PLATELET
Abs Immature Granulocytes: 0.01 K/uL (ref 0.00–0.07)
Basophils Absolute: 0 K/uL (ref 0.0–0.1)
Basophils Relative: 0 %
Eosinophils Absolute: 0.1 K/uL (ref 0.0–0.5)
Eosinophils Relative: 1 %
HCT: 35.1 % — ABNORMAL LOW (ref 36.0–46.0)
Hemoglobin: 11.9 g/dL — ABNORMAL LOW (ref 12.0–15.0)
Immature Granulocytes: 0 %
Lymphocytes Relative: 40 %
Lymphs Abs: 1.8 K/uL (ref 0.7–4.0)
MCH: 30.1 pg (ref 26.0–34.0)
MCHC: 33.9 g/dL (ref 30.0–36.0)
MCV: 88.9 fL (ref 80.0–100.0)
Monocytes Absolute: 0.6 K/uL (ref 0.1–1.0)
Monocytes Relative: 13 %
Neutro Abs: 2.1 K/uL (ref 1.7–7.7)
Neutrophils Relative %: 46 %
Platelets: 187 K/uL (ref 150–400)
RBC: 3.95 MIL/uL (ref 3.87–5.11)
RDW: 14.2 % (ref 11.5–15.5)
WBC: 4.6 K/uL (ref 4.0–10.5)
nRBC: 0 % (ref 0.0–0.2)

## 2023-03-15 LAB — MAGNESIUM: Magnesium: 1.8 mg/dL (ref 1.7–2.4)

## 2023-03-15 MED ORDER — PALONOSETRON HCL INJECTION 0.25 MG/5ML
0.2500 mg | Freq: Once | INTRAVENOUS | Status: AC
  Administered 2023-03-15: 0.25 mg via INTRAVENOUS
  Filled 2023-03-15: qty 5

## 2023-03-15 MED ORDER — SODIUM CHLORIDE 0.9 % IV SOLN
5.0000 mg/kg | Freq: Once | INTRAVENOUS | Status: AC
  Administered 2023-03-15: 450 mg via INTRAVENOUS
  Filled 2023-03-15: qty 13

## 2023-03-15 MED ORDER — LEUCOVORIN CALCIUM INJECTION 350 MG
400.0000 mg/m2 | Freq: Once | INTRAVENOUS | Status: AC
  Administered 2023-03-15: 804 mg via INTRAVENOUS
  Filled 2023-03-15: qty 40.2

## 2023-03-15 MED ORDER — DEXAMETHASONE SODIUM PHOSPHATE 10 MG/ML IJ SOLN
10.0000 mg | Freq: Once | INTRAMUSCULAR | Status: AC
Start: 2023-03-15 — End: 2023-03-15
  Administered 2023-03-15: 10 mg via INTRAVENOUS
  Filled 2023-03-15: qty 1

## 2023-03-15 MED ORDER — SODIUM CHLORIDE 0.9 % IV SOLN
2400.0000 mg/m2 | INTRAVENOUS | Status: DC
  Administered 2023-03-15: 5000 mg via INTRAVENOUS
  Filled 2023-03-15: qty 100

## 2023-03-15 MED ORDER — SODIUM CHLORIDE 0.9 % IV SOLN
150.0000 mg | Freq: Once | INTRAVENOUS | Status: AC
  Administered 2023-03-15: 150 mg via INTRAVENOUS
  Filled 2023-03-15: qty 5

## 2023-03-15 MED ORDER — DEXTROSE 5 % IV SOLN
INTRAVENOUS | Status: DC
Start: 2023-03-15 — End: 2023-03-15

## 2023-03-15 MED ORDER — SODIUM CHLORIDE 0.9 % IV SOLN: INTRAVENOUS | Status: DC

## 2023-03-15 MED ORDER — FLUOROURACIL CHEMO INJECTION 2.5 GM/50ML
400.0000 mg/m2 | Freq: Once | INTRAVENOUS | Status: AC
  Administered 2023-03-15: 800 mg via INTRAVENOUS
  Filled 2023-03-15: qty 16

## 2023-03-15 MED ORDER — OXALIPLATIN CHEMO INJECTION 100 MG/20ML
85.0000 mg/m2 | Freq: Once | INTRAVENOUS | Status: AC
  Administered 2023-03-15: 170 mg via INTRAVENOUS
  Filled 2023-03-15: qty 34

## 2023-03-15 MED ORDER — SODIUM CHLORIDE 0.9% FLUSH
10.0000 mL | Freq: Once | INTRAVENOUS | Status: AC
  Administered 2023-03-15: 10 mL via INTRAVENOUS

## 2023-03-15 NOTE — Patient Instructions (Signed)

## 2023-03-15 NOTE — Patient Instructions (Signed)
 [facility] [place] - A DEPT OF [facility]  Discharge Instructions: Thank you for choosing [facility] to provide your oncology and hematology care.  If you have a lab appointment with the Cancer Center - please note that after April 8th, 2024, all labs will be drawn in the cancer center.  You do not have to check in or register with the main entrance as you have in the past but will complete your check-in in the cancer center.  Wear comfortable clothing and clothing appropriate for easy access to any Portacath or PICC line.   We strive to give you quality time with your provider. You may need to reschedule your appointment if you arrive late (15 or more minutes).  Arriving late affects you and other patients whose appointments are after yours.  Also, if you miss three or more appointments without notifying the office, you may be dismissed from the clinic at the provider's discretion.      For prescription refill requests, have your pharmacy contact our office and allow 72 hours for refills to be completed.    Today you received the following chemotherapy and/or immunotherapy agents FOLFOX, return as scheduled.   To help prevent nausea and vomiting after your treatment, we encourage you to take your nausea medication as directed.  BELOW ARE SYMPTOMS THAT SHOULD BE REPORTED IMMEDIATELY: *FEVER GREATER THAN 100.4 F (38 C) OR HIGHER *CHILLS OR SWEATING *NAUSEA AND VOMITING THAT IS NOT CONTROLLED WITH YOUR NAUSEA MEDICATION *UNUSUAL SHORTNESS OF BREATH *UNUSUAL BRUISING OR BLEEDING *URINARY PROBLEMS (pain or burning when urinating, or frequent urination) *BOWEL PROBLEMS (unusual diarrhea, constipation, pain near the anus) TENDERNESS IN MOUTH AND THROAT WITH OR WITHOUT PRESENCE OF ULCERS (sore throat, sores in mouth, or a toothache) UNUSUAL RASH, SWELLING OR PAIN  UNUSUAL VAGINAL DISCHARGE OR ITCHING   Items with * indicate a potential emergency and  should be followed up as soon as possible or go to the Emergency Department if any problems should occur.  Please show the CHEMOTHERAPY ALERT CARD or IMMUNOTHERAPY ALERT CARD at check-in to the Emergency Department and triage nurse.  Should you have questions after your visit or need to cancel or reschedule your appointment, please contact [facility] [place] - A DEPT OF [facility] [phone]  and follow the prompts.  Office hours are 8:00 a.m. to 4:30 p.m. Monday - Friday. Please note that voicemails left after 4:00 p.m. may not be returned until the following business day.  We are closed weekends and major holidays. You have access to a nurse at all times for urgent questions. Please call the main number to the clinic [phone] and follow the prompts.  For any non-urgent questions, you may also contact your provider using MyChart. We now offer e-Visits for anyone 57 and older to request care online for non-urgent symptoms. For details visit [email or web address].   Also download the MyChart app! Go to the app store, search "MyChart", open the app, select [facility], and log in with your MyChart username and password.

## 2023-03-15 NOTE — Progress Notes (Signed)
 Patient presents today for treatment, labs and patient accessed by Dr. Katragadda, patient okay to proceed with treatment. Patient tolerated chemotherapy with no complaints voiced. Side effects with management reviewed understanding verbalized. Port site clean and dry with no bruising or swelling noted at site. Good blood return noted before and after administration of chemotherapy. Chemo pump connected with no alarms noted. Patient left in satisfactory condition with VSS and no s/s of distress noted.

## 2023-03-15 NOTE — Progress Notes (Signed)
 Patient has been examined by Dr. Ellin Saba. Vital signs and labs have been reviewed by MD - ANC, Creatinine, LFTs, hemoglobin, and platelets are within treatment parameters per M.D. - pt may proceed with treatment.  Primary RN and pharmacy notified.

## 2023-03-17 ENCOUNTER — Inpatient Hospital Stay: Payer: Medicaid Other

## 2023-03-17 VITALS — BP 160/98 | HR 85 | Temp 98.5°F | Resp 18

## 2023-03-17 DIAGNOSIS — C189 Malignant neoplasm of colon, unspecified: Secondary | ICD-10-CM

## 2023-03-17 DIAGNOSIS — Z95828 Presence of other vascular implants and grafts: Secondary | ICD-10-CM

## 2023-03-17 DIAGNOSIS — Z5111 Encounter for antineoplastic chemotherapy: Secondary | ICD-10-CM | POA: Diagnosis not present

## 2023-03-17 MED ORDER — HEPARIN SOD (PORK) LOCK FLUSH 100 UNIT/ML IV SOLN
500.0000 [IU] | Freq: Once | INTRAVENOUS | Status: AC | PRN
  Administered 2023-03-17: 500 [IU]

## 2023-03-17 MED ORDER — SODIUM CHLORIDE 0.9% FLUSH
10.0000 mL | INTRAVENOUS | Status: DC | PRN
Start: 2023-03-17 — End: 2023-03-17
  Administered 2023-03-17: 10 mL

## 2023-03-17 NOTE — Progress Notes (Signed)
 Patient presents today for pump d/c. Vital signs are stable. Port a cath site clean, dry, and intact. Port flushed with 10 mls of Normal Saline and 500 Units of Heparin. Needle removed intact. Band aid applied. Patient has no complaints at this time. Discharged from clinic ambulatory and in stable condition. Patient alert and oriented. All follow ups as scheduled.   Bodi Palmeri [employer]

## 2023-03-23 ENCOUNTER — Other Ambulatory Visit: Payer: Self-pay | Admitting: *Deleted

## 2023-03-29 ENCOUNTER — Inpatient Hospital Stay: Payer: Medicaid Other

## 2023-03-29 ENCOUNTER — Inpatient Hospital Stay: Payer: Medicaid Other | Admitting: Hematology

## 2023-03-29 VITALS — BP 166/91 | HR 105 | Temp 98.7°F | Resp 18

## 2023-03-29 DIAGNOSIS — C189 Malignant neoplasm of colon, unspecified: Secondary | ICD-10-CM

## 2023-03-29 DIAGNOSIS — Z95828 Presence of other vascular implants and grafts: Secondary | ICD-10-CM

## 2023-03-29 DIAGNOSIS — Z5111 Encounter for antineoplastic chemotherapy: Secondary | ICD-10-CM | POA: Diagnosis not present

## 2023-03-29 LAB — CBC WITH DIFFERENTIAL/PLATELET
Abs Immature Granulocytes: 0.02 10*3/uL (ref 0.00–0.07)
Basophils Absolute: 0 10*3/uL (ref 0.0–0.1)
Basophils Relative: 1 %
Eosinophils Absolute: 0.1 10*3/uL (ref 0.0–0.5)
Eosinophils Relative: 1 %
HCT: 34.6 % — ABNORMAL LOW (ref 36.0–46.0)
Hemoglobin: 11.9 g/dL — ABNORMAL LOW (ref 12.0–15.0)
Immature Granulocytes: 1 %
Lymphocytes Relative: 34 %
Lymphs Abs: 1.4 10*3/uL (ref 0.7–4.0)
MCH: 30.5 pg (ref 26.0–34.0)
MCHC: 34.4 g/dL (ref 30.0–36.0)
MCV: 88.7 fL (ref 80.0–100.0)
Monocytes Absolute: 0.9 10*3/uL (ref 0.1–1.0)
Monocytes Relative: 23 %
Neutro Abs: 1.7 10*3/uL (ref 1.7–7.7)
Neutrophils Relative %: 40 %
Platelets: 169 10*3/uL (ref 150–400)
RBC: 3.9 MIL/uL (ref 3.87–5.11)
RDW: 15.3 % (ref 11.5–15.5)
WBC: 4.1 10*3/uL (ref 4.0–10.5)
nRBC: 0 % (ref 0.0–0.2)

## 2023-03-29 LAB — COMPREHENSIVE METABOLIC PANEL
ALT: 15 U/L (ref 0–44)
AST: 19 U/L (ref 15–41)
Albumin: 3.5 g/dL (ref 3.5–5.0)
Alkaline Phosphatase: 61 U/L (ref 38–126)
Anion gap: 11 (ref 5–15)
BUN: 6 mg/dL (ref 6–20)
CO2: 19 mmol/L — ABNORMAL LOW (ref 22–32)
Calcium: 8.6 mg/dL — ABNORMAL LOW (ref 8.9–10.3)
Chloride: 106 mmol/L (ref 98–111)
Creatinine, Ser: 0.72 mg/dL (ref 0.44–1.00)
GFR, Estimated: >60 mL/min (ref >60)
Glucose, Bld: 102 mg/dL — ABNORMAL HIGH (ref 70–99)
Potassium: 3.1 mmol/L — ABNORMAL LOW (ref 3.5–5.1)
Sodium: 136 mmol/L (ref 135–145)
Total Bilirubin: 0.5 mg/dL (ref 0.0–1.2)
Total Protein: 7 g/dL (ref 6.5–8.1)

## 2023-03-29 LAB — PREGNANCY, URINE: Preg Test, Ur: NEGATIVE

## 2023-03-29 LAB — MAGNESIUM: Magnesium: 1.7 mg/dL (ref 1.7–2.4)

## 2023-03-29 MED ORDER — SODIUM CHLORIDE 0.9 % IV SOLN: INTRAVENOUS | Status: DC

## 2023-03-29 MED ORDER — DEXTROSE 5 % IV SOLN
85.0000 mg/m2 | Freq: Once | INTRAVENOUS | Status: AC
  Administered 2023-03-29: 170 mg via INTRAVENOUS
  Filled 2023-03-29: qty 34

## 2023-03-29 MED ORDER — SODIUM CHLORIDE 0.9% FLUSH
10.0000 mL | Freq: Once | INTRAVENOUS | Status: AC
Start: 2023-03-29 — End: 2023-03-29
  Administered 2023-03-29: 10 mL via INTRAVENOUS

## 2023-03-29 MED ORDER — LEUCOVORIN CALCIUM INJECTION 350 MG
400.0000 mg/m2 | Freq: Once | INTRAVENOUS | Status: AC
  Administered 2023-03-29: 804 mg via INTRAVENOUS
  Filled 2023-03-29: qty 40.2

## 2023-03-29 MED ORDER — SODIUM CHLORIDE 0.9 % IV SOLN
150.0000 mg | Freq: Once | INTRAVENOUS | Status: AC
  Administered 2023-03-29: 150 mg via INTRAVENOUS
  Filled 2023-03-29: qty 5

## 2023-03-29 MED ORDER — DEXAMETHASONE SODIUM PHOSPHATE 10 MG/ML IJ SOLN
10.0000 mg | Freq: Once | INTRAMUSCULAR | Status: AC
  Administered 2023-03-29: 10 mg via INTRAVENOUS
  Filled 2023-03-29: qty 1

## 2023-03-29 MED ORDER — SODIUM CHLORIDE 0.9 % IV SOLN
5.0000 mg/kg | Freq: Once | INTRAVENOUS | Status: AC
  Administered 2023-03-29: 450 mg via INTRAVENOUS
  Filled 2023-03-29: qty 16

## 2023-03-29 MED ORDER — SODIUM CHLORIDE 0.9 % IV SOLN
2400.0000 mg/m2 | INTRAVENOUS | Status: DC
  Administered 2023-03-29: 5000 mg via INTRAVENOUS
  Filled 2023-03-29: qty 100

## 2023-03-29 MED ORDER — POTASSIUM CHLORIDE CRYS ER 20 MEQ PO TBCR
40.0000 meq | EXTENDED_RELEASE_TABLET | Freq: Once | ORAL | Status: AC
  Administered 2023-03-29: 40 meq via ORAL
  Filled 2023-03-29: qty 2

## 2023-03-29 MED ORDER — DEXTROSE 5 % IV SOLN: INTRAVENOUS | Status: DC

## 2023-03-29 MED ORDER — PALONOSETRON HCL INJECTION 0.25 MG/5ML
0.2500 mg | Freq: Once | INTRAVENOUS | Status: AC
  Administered 2023-03-29: 0.25 mg via INTRAVENOUS
  Filled 2023-03-29: qty 5

## 2023-03-29 MED ORDER — FLUOROURACIL CHEMO INJECTION 2.5 GM/50ML
400.0000 mg/m2 | Freq: Once | INTRAVENOUS | Status: AC
  Administered 2023-03-29: 800 mg via INTRAVENOUS
  Filled 2023-03-29: qty 16

## 2023-03-29 NOTE — Progress Notes (Signed)
Patient presents today for Folfox + Vegzelma infusion. Vital signs and labs within parameters for treatment. MAR reviewed and updated. Potassium 3.1. Standing orders followed. Last urine 03-01-2023 30 protein. Heart rate elevated on arrival. Recheck 104 on heart rate.   Message received from Dr. Ellin Saba to proceed with treatment. Heart rate 104.    Treatment given today per MD orders. Tolerated infusion without adverse affects. Vital signs stable. No complaints at this time. Discharged from clinic ambulatory in stable condition. Alert and oriented x 3. F/U with [facility] as scheduled.

## 2023-03-29 NOTE — Patient Instructions (Signed)
[facility] [place] - [facility]  Discharge Instructions: Thank you for choosing [facility] to provide your oncology and hematology care.  If you have a lab appointment with the Cancer Center - please note that after April 8th, 2024, all labs will be drawn in the cancer center.  You do not have to check in or register with the main entrance as you have in the past but will complete your check-in in the cancer center.  Wear comfortable clothing and clothing appropriate for easy access to any Portacath or PICC line.   We strive to give you quality time with your provider. You may need to reschedule your appointment if you arrive late (15 or more minutes).  Arriving late affects you and other patients whose appointments are after yours.  Also, if you miss three or more appointments without notifying the office, you may be dismissed from the clinic at the provider's discretion.      For prescription refill requests, have your pharmacy contact our office and allow 72 hours for refills to be completed.    Today you received the following chemotherapy and/or immunotherapy agents folfox. Bevacizumab Injection What is this medication? BEVACIZUMAB (be va SIZ yoo mab) treats some types of cancer. It works by blocking a protein that causes cancer cells to grow and multiply. This helps to slow or stop the spread of cancer cells. It is a monoclonal antibody. This medicine may be used for other purposes; ask your health care provider or pharmacist if you have questions. COMMON BRAND NAME(S): Alymsys, Avastin, MVASI, Rosaland Lao What should I tell my care team before I take this medication? They need to know if you have any of these conditions: Blood clots Coughing up blood Having or recent surgery Heart failure High blood pressure History of a connection between 2 or more body parts that do not usually connect (fistula) History of a tear in your  stomach or intestines Protein in your urine An unusual or allergic reaction to bevacizumab, other medications, foods, dyes, or preservatives Pregnant or trying to get pregnant Breast-feeding How should I use this medication? This medication is injected into a vein. It is given by your care team in a hospital or clinic setting. Talk to your care team the use of this medication in children. Special care may be needed. Overdosage: If you think you have taken too much of this medicine contact a poison control center or emergency room at once. NOTE: This medicine is only for you. Do not share this medicine with others. What if I miss a dose? Keep appointments for follow-up doses. It is important not to miss your dose. Call your care team if you are unable to keep an appointment. What may interact with this medication? Interactions are not expected. This list may not describe all possible interactions. Give your health care provider a list of all the medicines, herbs, non-prescription drugs, or dietary supplements you use. Also tell them if you smoke, drink alcohol, or use illegal drugs. Some items may interact with your medicine. What should I watch for while using this medication? Your condition will be monitored carefully while you are receiving this medication. You may need blood work while taking this medication. This medication may make you feel generally unwell. This is not uncommon as chemotherapy can affect healthy cells as well as cancer cells. Report any side effects. Continue your course of treatment even though you feel ill  unless your care team tells you to stop. This medication may increase your risk to bruise or bleed. Call your care team if you notice any unusual bleeding. Before having surgery, talk to your care team to make sure it is ok. This medication can increase the risk of poor healing of your surgical site or wound. You will need to stop this medication for 28 days before  surgery. After surgery, wait at least 28 days before restarting this medication. Make sure the surgical site or wound is healed enough before restarting this medication. Talk to your care team if questions. Talk to your care team if you may be pregnant. Serious birth defects can occur if you take this medication during pregnancy and for 6 months after the last dose. Contraception is recommended while taking this medication and for 6 months after the last dose. Your care team can help you find the option that works for you. Do not breastfeed while taking this medication and for 6 months after the last dose. This medication can cause infertility. Talk to your care team if you are concerned about your fertility. What side effects may I notice from receiving this medication? Side effects that you should report to your care team as soon as possible: Allergic reactions--skin rash, itching, hives, swelling of the face, lips, tongue, or throat Bleeding--bloody or black, tar-like stools, vomiting blood or brown material that looks like coffee grounds, red or dark brown urine, small red or purple spots on skin, unusual bruising or bleeding Blood clot--pain, swelling, or warmth in the leg, shortness of breath, chest pain Heart attack--pain or tightness in the chest, shoulders, arms, or jaw, nausea, shortness of breath, cold or clammy skin, feeling faint or lightheaded Heart failure--shortness of breath, swelling of the ankles, feet, or hands, sudden weight gain, unusual weakness or fatigue Increase in blood pressure Infection--fever, chills, cough, sore throat, wounds that don't heal, pain or trouble when passing urine, general feeling of discomfort or being unwell Infusion reactions--chest pain, shortness of breath or trouble breathing, feeling faint or lightheaded Kidney injury--decrease in the amount of urine, swelling of the ankles, hands, or feet Stomach pain that is severe, does not go away, or gets  worse Stroke--sudden numbness or weakness of the face, arm, or leg, trouble speaking, confusion, trouble walking, loss of balance or coordination, dizziness, severe headache, change in vision Sudden and severe headache, confusion, change in vision, seizures, which may be signs of posterior reversible encephalopathy syndrome (PRES) Side effects that usually do not require medical attention (report to your care team if they continue or are bothersome): Back pain Change in taste Diarrhea Dry skin Increased tears Nosebleed This list may not describe all possible side effects. Call your doctor for medical advice about side effects. You may report side effects to FDA at 1-800-FDA-1088. Where should I keep my medication? This medication is given in a hospital or clinic. It will not be stored at home. NOTE: This sheet is a summary. It may not cover all possible information. If you have questions about this medicine, talk to your doctor, pharmacist, or health care provider.  2024 Elsevier/Gold Standard (2021-06-11 00:00:00)      To help prevent nausea and vomiting after your treatment, we encourage you to take your nausea medication as directed.  BELOW ARE SYMPTOMS THAT SHOULD BE REPORTED IMMEDIATELY: *FEVER GREATER THAN 100.4 F (38 C) OR HIGHER *CHILLS OR SWEATING *NAUSEA AND VOMITING THAT IS NOT CONTROLLED WITH YOUR NAUSEA MEDICATION *UNUSUAL SHORTNESS OF BREATH *  UNUSUAL BRUISING OR BLEEDING *URINARY PROBLEMS (pain or burning when urinating, or frequent urination) *BOWEL PROBLEMS (unusual diarrhea, constipation, pain near the anus) TENDERNESS IN MOUTH AND THROAT WITH OR WITHOUT PRESENCE OF ULCERS (sore throat, sores in mouth, or a toothache) UNUSUAL RASH, SWELLING OR PAIN  UNUSUAL VAGINAL DISCHARGE OR ITCHING   Items with * indicate a potential emergency and should be followed up as soon as possible or go to the Emergency Department if any problems should occur.  Please show the  CHEMOTHERAPY ALERT CARD or IMMUNOTHERAPY ALERT CARD at check-in to the Emergency Department and triage nurse.  Should you have questions after your visit or need to cancel or reschedule your appointment, please contact [facility] [place] - A DEPT OF [facility] [phone]  and follow the prompts.  Office hours are 8:00 a.m. to 4:30 p.m. Monday - Friday. Please note that voicemails left after 4:00 p.m. may not be returned until the following business day.  We are closed weekends and major holidays. You have access to a nurse at all times for urgent questions. Please call the main number to the clinic [phone] and follow the prompts.  For any non-urgent questions, you may also contact your provider using MyChart. We now offer e-Visits for anyone 73 and older to request care online for non-urgent symptoms. For details visit [email or web address].   Also download the MyChart app! Go to the app store, search "MyChart", open the app, select [place], and log in with your MyChart username and password.

## 2023-03-30 LAB — CEA: CEA: 127 ng/mL — ABNORMAL HIGH (ref 0.0–4.7)

## 2023-03-31 ENCOUNTER — Inpatient Hospital Stay: Payer: Medicaid Other

## 2023-03-31 VITALS — BP 138/81 | HR 91 | Temp 98.1°F | Resp 18

## 2023-03-31 DIAGNOSIS — C189 Malignant neoplasm of colon, unspecified: Secondary | ICD-10-CM

## 2023-03-31 DIAGNOSIS — Z5111 Encounter for antineoplastic chemotherapy: Secondary | ICD-10-CM | POA: Diagnosis not present

## 2023-03-31 DIAGNOSIS — Z95828 Presence of other vascular implants and grafts: Secondary | ICD-10-CM

## 2023-03-31 MED ORDER — HEPARIN SOD (PORK) LOCK FLUSH 100 UNIT/ML IV SOLN
500.0000 [IU] | Freq: Once | INTRAVENOUS | Status: AC | PRN
  Administered 2023-03-31: 500 [IU]

## 2023-03-31 MED ORDER — SODIUM CHLORIDE 0.9% FLUSH
10.0000 mL | INTRAVENOUS | Status: DC | PRN
  Administered 2023-03-31: 10 mL

## 2023-03-31 NOTE — Progress Notes (Signed)
 Patient presents today for pump d/c. Vital signs are stable. Port a cath site clean, dry, and intact. Port flushed with 10 mls of Normal Saline and 500 Units of Heparin. Needle removed intact. Band aid applied. Patient has no complaints at this time. Discharged from clinic ambulatory and in stable condition. Patient alert and oriented. All follow ups as scheduled.   Diana [employer]

## 2023-04-03 ENCOUNTER — Ambulatory Visit (HOSPITAL_COMMUNITY): Admission: RE | Admit: 2023-04-03 | Payer: Medicaid Other | Source: Ambulatory Visit

## 2023-04-06 IMAGING — CT CT ABD-PELV W/ CM
2 of 5 series · 13 of 46 positions shown, 15 images · IV contrast (Omnipaque or Isovue)
Comparison: There is no prior CT for comparison. Limited comparison
is available with a pelvic ultrasound 12/13/2018.

CLINICAL DATA: Abdominal pain and pressure.

EXAM:
CT ABDOMEN AND PELVIS WITH CONTRAST
TECHNIQUE: Multidetector CT imaging of the abdomen and pelvis was performed
using the standard protocol following bolus administration of
intravenous contrast.
CONTRAST:  80mL OMNIPAQUE IOHEXOL 300 MG/ML  SOLN

[Series 2: axial st · axial · 0.85mm/px · z∈[+894,+1319]mm · 10 of 97 slices shown, 12 images]
[im 6/97  soft-tissue]
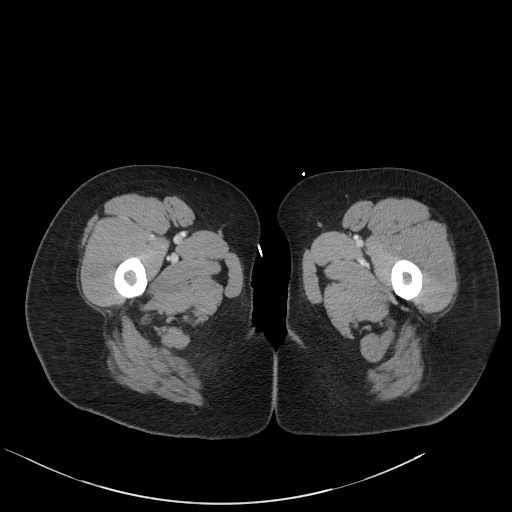
[im 6/97  bone]
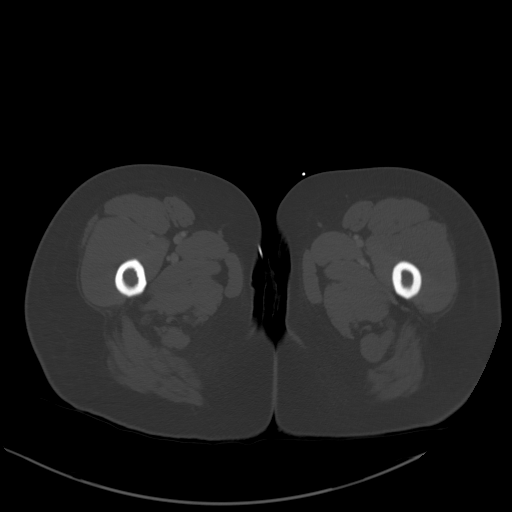
[im 16/97  soft-tissue]
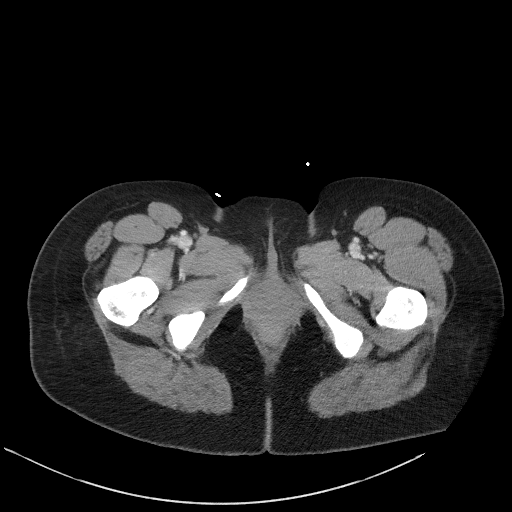
[im 26/97  soft-tissue]
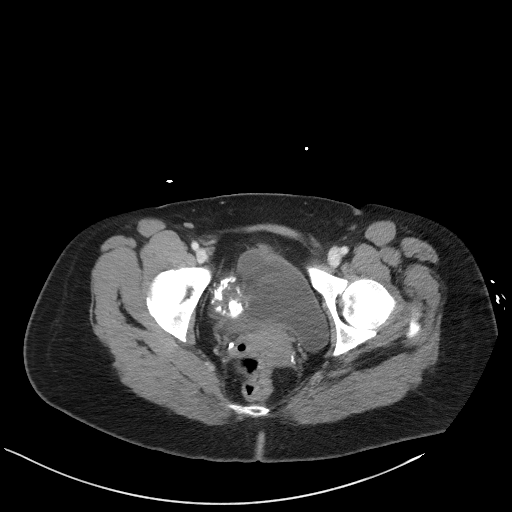
[im 36/97  soft-tissue]
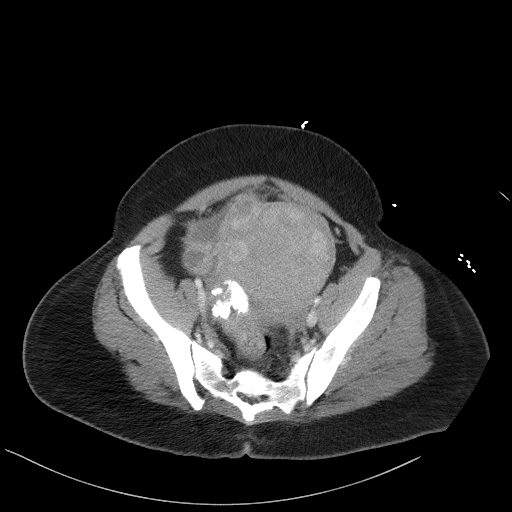
[im 46/97  soft-tissue]
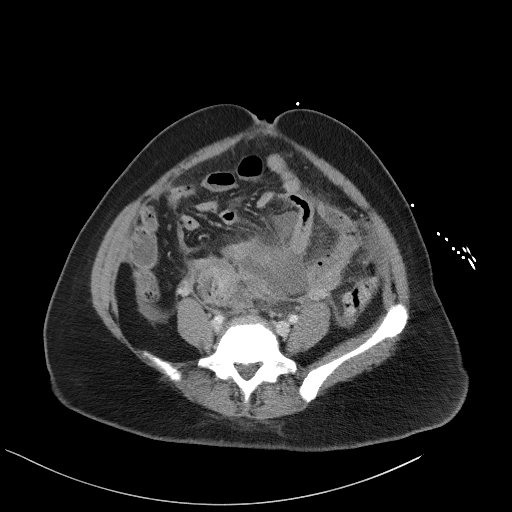
[im 51/97  soft-tissue]
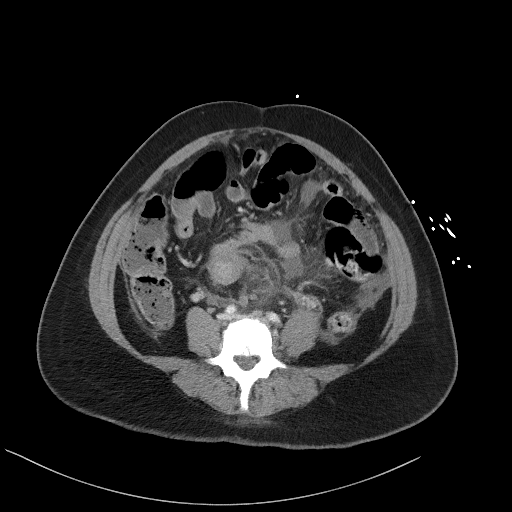
[im 61/97  soft-tissue]
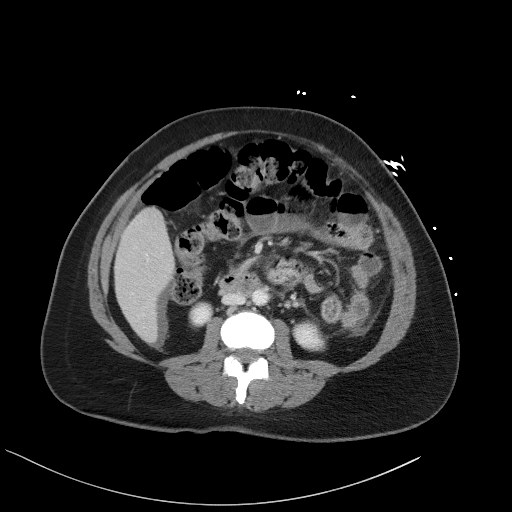
[im 71/97  soft-tissue]
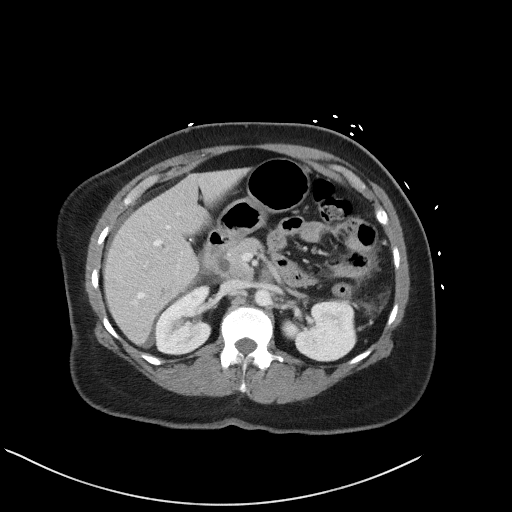
[im 81/97  soft-tissue]
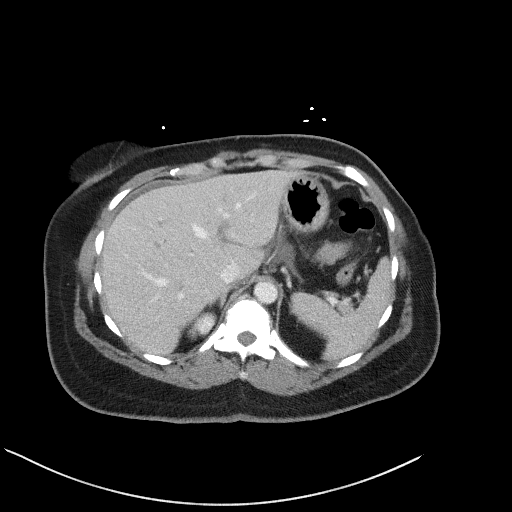
[im 81/97  bone]
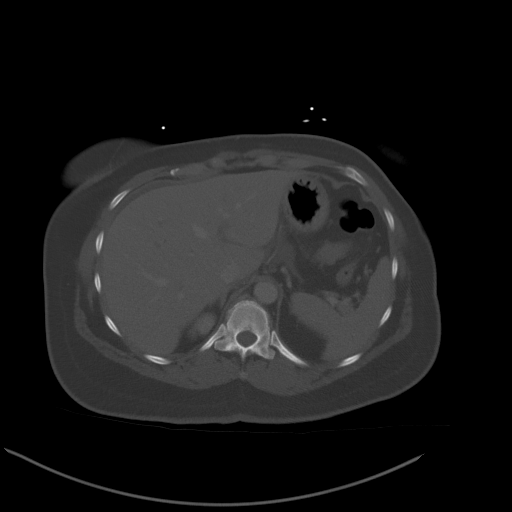
[im 91/97  soft-tissue]
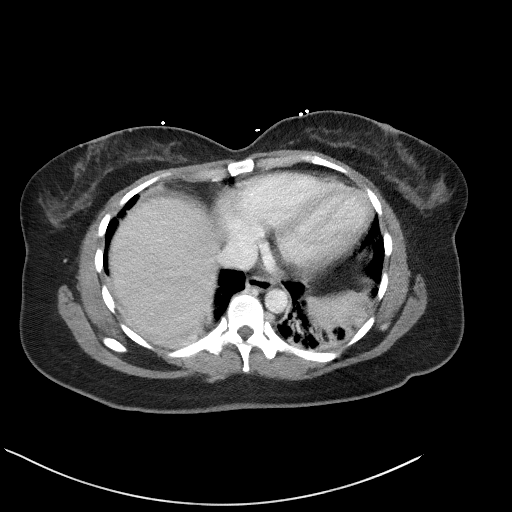

[Series 5: coronal st · coronal · 0.81mm/px · 3 of 88 slices shown]
[im 30/88  soft-tissue]
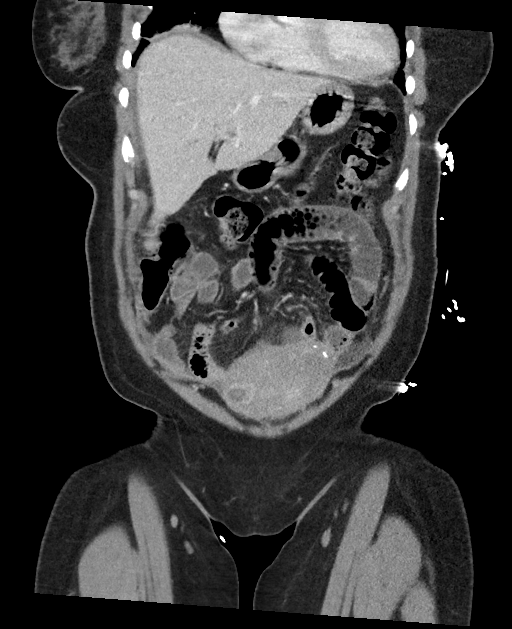
[im 39/88  soft-tissue]
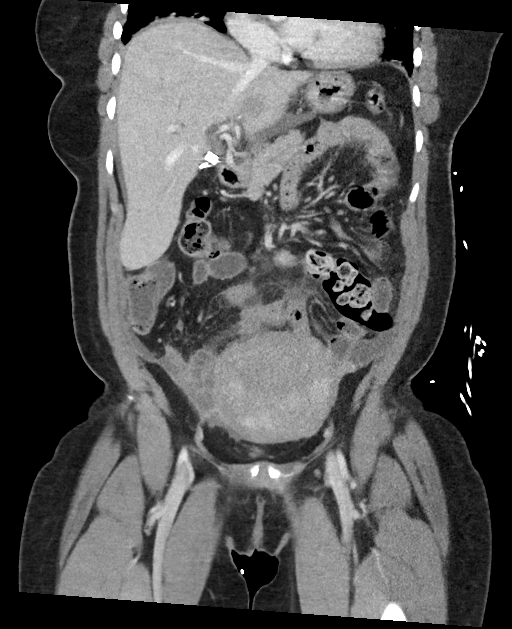
[im 49/88  soft-tissue]
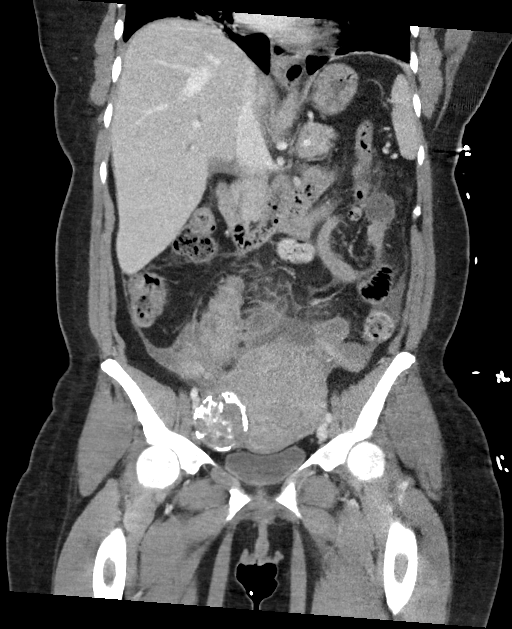

[13 of 46 positions shown; findings below may reference images not displayed]

FINDINGS: Lower chest: There are patchy opacities in the lower lobes which
could be due to pneumonia or atelectasis. There is no pleural
effusion. There is mild-to-moderate pan chamber cardiomegaly.

Hepatobiliary: 21 cm in length slightly steatotic. Gallbladder is
absent with postcholecystectomy extrahepatic bile duct prominence
with common bile duct 8 mm without visible filling defect and slight
intrahepatic biliary prominence. No mass enhancement is seen in the
liver.

Pancreas: Unremarkable. No pancreatic ductal dilatation or
surrounding inflammatory changes.

Spleen: Normal in size without focal abnormality.

Adrenals/Urinary Tract: There is no adrenal mass, no focal
abnormality of the renal cortex and no appreciable calculus or
hydronephrosis. The bladder thickness is normal.

Stomach/Bowel: The gastric wall and upper and mid abdominal small
bowel are unremarkable without contrast. There is dilatation of some
of the mid to lower abdominal small bowel up to 3.2 cm and relative
decompression in the lower abdominal small bowel with occasional
right lower quadrant segments showing mild thickening. A focal
transitional segment was not seen.

An approximately 10 cm length of the distal sigmoid colon
demonstrates moderate circumferential wall thickening and adjacent
inflammatory reaction. There is a 1.8 x 2.4 cm rim enhancing
structure medial to the diseased segment just anterior to L4-5 right
of the midline which could represent a contained perforation or
abutting necrotic lymph node.

There are additional mildly prominent bilateral common iliac chain
nodes common both sides up to 1.1 cm short axis posterior to this
process. There is suspected fistula to a small bowel loop at the
proximal end of the diseased segment as seen on axial images 45-47
and also suspected on axial images 46-49. Inflammatory changes are
also present in the mesentery at the sites of suspected
fistulization.

There is scattered fluid in the mesenteric folds in the mid to lower
abdomen as well noted as well as mild perihepatic and paracolic
gutter low-density free fluid, but there is no visible free air in
the abdomen or the pelvis.

The rest of the colonic wall is unremarkable. The appendix is normal
in caliber. The terminal ileum is within normal limits. There is
mild fecal stasis in the ascending and transverse colon. There are
scattered diverticula along the left colon.

Vascular/Lymphatic: No significant vascular findings. As above there
are mildly prominent bilateral common iliac chain nodes and either a
necrotic 2.5 x 1.8 cm lymph node or a contained perforation adjacent
to the midportion of the diseased sigmoid segment described above.
There is no further adenopathy visible.

Reproductive: Enlarged fibroid uterus is noted as on the prior
ultrasound. Largest fibroid is a calcified leiomyoma in the right
body of uterus. The ovaries are follicular but do not appear
enlarged.

Other: Pelvic phleboliths. Mild mesenteric, upper abdominal and
posterior pelvic ascites. No appreciable pneumoperitoneum. Small
umbilical fat hernia.

Musculoskeletal: No acute or significant osseous findings.
Degenerative disc disease with mild spondylosis and disc osteophyte
complex, L4-5
IMPRESSION: 1. There is a diffusely thickened, roughly 10 cm segment of the
distal sigmoid colon with adjacent moderate inflammatory reaction,
suspected 2 sites of small bowel fistulization to the diseased
segment, and a rim enhancing low-density 2.5 x 1.8 cm structure
medial to the mid diseased segment which could represent a necrotic
lymph node or contained perforation/abscess. There are bilateral
mildly enlarged common iliac chain nodes associated with this. This
could be an acute on chronic diverticulitis or could be an inflamed
carcinoma. There is mild associated ascites.
2. There are mildly dilated mid to lower abdominal small bowel
segments up to 3.2 cm without visible transition and decompressed
but occasionally thickened small bowel segments in the right lower
abdomen. A low-grade small-bowel obstruction potentially related to
the inflammatory process is possible and there could be additional
small bowel enteritis in the right lower abdomen. Does the patient
have any history of inflammatory bowel disease?
3. Mildly prominent slightly steatotic liver.
4. Post cholecystectomy biliary prominence without a visible biliary
ductal filling defect. Laboratory and clinical correlation advised.
5. Enlarged fibroid uterus.
6. Umbilical fat hernia.
7. Cardiomegaly.
8. Opacities in the lower lobes which could be atelectasis,
pneumonia or combination.

## 2023-04-12 ENCOUNTER — Inpatient Hospital Stay: Payer: Medicaid Other | Attending: Hematology

## 2023-04-12 ENCOUNTER — Inpatient Hospital Stay: Payer: Medicaid Other

## 2023-04-12 ENCOUNTER — Inpatient Hospital Stay: Payer: Medicaid Other | Admitting: Hematology

## 2023-04-12 ENCOUNTER — Encounter: Payer: Self-pay | Admitting: Hematology

## 2023-04-12 VITALS — BP 139/78 | HR 83 | Temp 97.4°F | Resp 18

## 2023-04-12 VITALS — BP 146/89 | HR 91 | Temp 98.0°F | Resp 18 | Wt 191.3 lb

## 2023-04-12 DIAGNOSIS — Z5111 Encounter for antineoplastic chemotherapy: Secondary | ICD-10-CM | POA: Insufficient documentation

## 2023-04-12 DIAGNOSIS — Z95828 Presence of other vascular implants and grafts: Secondary | ICD-10-CM

## 2023-04-12 DIAGNOSIS — C189 Malignant neoplasm of colon, unspecified: Secondary | ICD-10-CM | POA: Insufficient documentation

## 2023-04-12 LAB — COMPREHENSIVE METABOLIC PANEL
ALT: 15 U/L (ref 0–44)
AST: 21 U/L (ref 15–41)
Albumin: 3.3 g/dL — ABNORMAL LOW (ref 3.5–5.0)
Alkaline Phosphatase: 65 U/L (ref 38–126)
Anion gap: 9 (ref 5–15)
BUN: 9 mg/dL (ref 6–20)
CO2: 19 mmol/L — ABNORMAL LOW (ref 22–32)
Calcium: 8.8 mg/dL — ABNORMAL LOW (ref 8.9–10.3)
Chloride: 108 mmol/L (ref 98–111)
Creatinine, Ser: 0.72 mg/dL (ref 0.44–1.00)
GFR, Estimated: >60 mL/min (ref >60)
Glucose, Bld: 112 mg/dL — ABNORMAL HIGH (ref 70–99)
Potassium: 3.5 mmol/L (ref 3.5–5.1)
Sodium: 136 mmol/L (ref 135–145)
Total Bilirubin: 0.5 mg/dL (ref 0.0–1.2)
Total Protein: 6.8 g/dL (ref 6.5–8.1)

## 2023-04-12 LAB — CBC WITH DIFFERENTIAL/PLATELET
Abs Immature Granulocytes: 0.01 10*3/uL (ref 0.00–0.07)
Basophils Absolute: 0 10*3/uL (ref 0.0–0.1)
Basophils Relative: 1 %
Eosinophils Absolute: 0 10*3/uL (ref 0.0–0.5)
Eosinophils Relative: 1 %
HCT: 34.6 % — ABNORMAL LOW (ref 36.0–46.0)
Hemoglobin: 11.7 g/dL — ABNORMAL LOW (ref 12.0–15.0)
Immature Granulocytes: 0 %
Lymphocytes Relative: 43 %
Lymphs Abs: 1.8 10*3/uL (ref 0.7–4.0)
MCH: 30.2 pg (ref 26.0–34.0)
MCHC: 33.8 g/dL (ref 30.0–36.0)
MCV: 89.4 fL (ref 80.0–100.0)
Monocytes Absolute: 0.5 10*3/uL (ref 0.1–1.0)
Monocytes Relative: 12 %
Neutro Abs: 1.8 10*3/uL (ref 1.7–7.7)
Neutrophils Relative %: 43 %
Platelets: 176 10*3/uL (ref 150–400)
RBC: 3.87 MIL/uL (ref 3.87–5.11)
RDW: 16.4 % — ABNORMAL HIGH (ref 11.5–15.5)
WBC: 4.1 10*3/uL (ref 4.0–10.5)
nRBC: 0 % (ref 0.0–0.2)

## 2023-04-12 LAB — URINALYSIS, DIPSTICK ONLY
Bilirubin Urine: NEGATIVE
Glucose, UA: NEGATIVE mg/dL
Ketones, ur: NEGATIVE mg/dL
Nitrite: NEGATIVE
Protein, ur: 30 mg/dL — AB
Specific Gravity, Urine: 1.014 (ref 1.005–1.030)
pH: 6 (ref 5.0–8.0)

## 2023-04-12 LAB — MAGNESIUM: Magnesium: 1.7 mg/dL (ref 1.7–2.4)

## 2023-04-12 MED ORDER — DEXAMETHASONE SODIUM PHOSPHATE 10 MG/ML IJ SOLN
10.0000 mg | Freq: Once | INTRAMUSCULAR | Status: AC
Start: 2023-04-12 — End: 2023-04-12
  Administered 2023-04-12: 10 mg via INTRAVENOUS
  Filled 2023-04-12: qty 1

## 2023-04-12 MED ORDER — PALONOSETRON HCL INJECTION 0.25 MG/5ML
0.2500 mg | Freq: Once | INTRAVENOUS | Status: AC
  Administered 2023-04-12: 0.25 mg via INTRAVENOUS
  Filled 2023-04-12: qty 5

## 2023-04-12 MED ORDER — SODIUM CHLORIDE 0.9% FLUSH: 10.0000 mL | INTRAVENOUS | Status: DC | PRN

## 2023-04-12 MED ORDER — OXALIPLATIN CHEMO INJECTION 100 MG/20ML
85.0000 mg/m2 | Freq: Once | INTRAVENOUS | Status: AC
  Administered 2023-04-12: 170 mg via INTRAVENOUS
  Filled 2023-04-12: qty 34

## 2023-04-12 MED ORDER — HEPARIN SOD (PORK) LOCK FLUSH 100 UNIT/ML IV SOLN: 500.0000 [IU] | Freq: Once | INTRAVENOUS | Status: DC | PRN

## 2023-04-12 MED ORDER — SODIUM CHLORIDE 0.9 % IV SOLN: INTRAVENOUS | Status: DC

## 2023-04-12 MED ORDER — FLUOROURACIL CHEMO INJECTION 2.5 GM/50ML
400.0000 mg/m2 | Freq: Once | INTRAVENOUS | Status: AC
  Administered 2023-04-12: 800 mg via INTRAVENOUS
  Filled 2023-04-12: qty 16

## 2023-04-12 MED ORDER — ALTEPLASE 2 MG IJ SOLR
2.0000 mg | Freq: Once | INTRAMUSCULAR | Status: AC | PRN
Start: 2023-04-12 — End: 2023-04-12
  Administered 2023-04-12: 2 mg
  Filled 2023-04-12: qty 2

## 2023-04-12 MED ORDER — DEXTROSE 5 % IV SOLN: INTRAVENOUS | Status: DC

## 2023-04-12 MED ORDER — LEUCOVORIN CALCIUM INJECTION 350 MG
400.0000 mg/m2 | Freq: Once | INTRAVENOUS | Status: AC
  Administered 2023-04-12: 804 mg via INTRAVENOUS
  Filled 2023-04-12: qty 40.2

## 2023-04-12 MED ORDER — SODIUM CHLORIDE 0.9 % IV SOLN
150.0000 mg | Freq: Once | INTRAVENOUS | Status: AC
  Administered 2023-04-12: 150 mg via INTRAVENOUS
  Filled 2023-04-12: qty 150

## 2023-04-12 MED ORDER — SODIUM CHLORIDE 0.9 % IV SOLN
2400.0000 mg/m2 | INTRAVENOUS | Status: DC
  Administered 2023-04-12: 5000 mg via INTRAVENOUS
  Filled 2023-04-12: qty 100

## 2023-04-12 MED ORDER — SODIUM CHLORIDE 0.9% FLUSH
10.0000 mL | INTRAVENOUS | Status: DC | PRN
  Administered 2023-04-12: 10 mL via INTRAVENOUS

## 2023-04-12 MED ORDER — SODIUM CHLORIDE 0.9 % IV SOLN
5.0000 mg/kg | Freq: Once | INTRAVENOUS | Status: AC
  Administered 2023-04-12: 450 mg via INTRAVENOUS
  Filled 2023-04-12: qty 16

## 2023-04-12 NOTE — Progress Notes (Signed)
 Patient presents today for FOLFOX with 5FU pump start per providers order.  Vital signs within parameters for treatment. Port checked for blood return and blood return was present. Labs within parameters for treatment.  Treatment given today per MD orders.  Tolerated infusion without adverse affects.  Vital signs stable.  No complaints at this time.  Discharge from clinic ambulatory in stable condition.  Alert and oriented X 3.  Follow up with [facility] as scheduled.

## 2023-04-12 NOTE — Progress Notes (Signed)
 Patients port flushed without difficulty.  Good blood return noted with no bruising or swelling noted at site.  Patient remains accessed for treatment.

## 2023-04-12 NOTE — Patient Instructions (Signed)
 [facility] [place] [facility]  Discharge Instructions: Thank you for choosing [facility] to provide your oncology and hematology care.  If you have a lab appointment with the Cancer Center - please note that after April 8th, 2024, all labs will be drawn in the cancer center.  You do not have to check in or register with the main entrance as you have in the past but will complete your check-in in the cancer center.  Wear comfortable clothing and clothing appropriate for easy access to any Portacath or PICC line.   We strive to give you quality time with your provider. You may need to reschedule your appointment if you arrive late (15 or more minutes).  Arriving late affects you and other patients whose appointments are after yours.  Also, if you miss three or more appointments without notifying the office, you may be dismissed from the clinic at the provider's discretion.      For prescription refill requests, have your pharmacy contact our office and allow 72 hours for refills to be completed.    Today you received the following chemotherapy and/or immunotherapy agents folfox 26fu      To help prevent nausea and vomiting after your treatment, we encourage you to take your nausea medication as directed.  BELOW ARE SYMPTOMS THAT SHOULD BE REPORTED IMMEDIATELY: *FEVER GREATER THAN 100.4 F (38 C) OR HIGHER *CHILLS OR SWEATING *NAUSEA AND VOMITING THAT IS NOT CONTROLLED WITH YOUR NAUSEA MEDICATION *UNUSUAL SHORTNESS OF BREATH *UNUSUAL BRUISING OR BLEEDING *URINARY PROBLEMS (pain or burning when urinating, or frequent urination) *BOWEL PROBLEMS (unusual diarrhea, constipation, pain near the anus) TENDERNESS IN MOUTH AND THROAT WITH OR WITHOUT PRESENCE OF ULCERS (sore throat, sores in mouth, or a toothache) UNUSUAL RASH, SWELLING OR PAIN  UNUSUAL VAGINAL DISCHARGE OR ITCHING   Items with * indicate a potential emergency and should be followed  up as soon as possible or go to the Emergency Department if any problems should occur.  Please show the CHEMOTHERAPY ALERT CARD or IMMUNOTHERAPY ALERT CARD at check-in to the Emergency Department and triage nurse.  Should you have questions after your visit or need to cancel or reschedule your appointment, please contact [facility] [place] - A DEPT OF [facility] [phone]  and follow the prompts.  Office hours are 8:00 a.m. to 4:30 p.m. Monday - Friday. Please note that voicemails left after 4:00 p.m. may not be returned until the following business day.  We are closed weekends and major holidays. You have access to a nurse at all times for urgent questions. Please call the main number to the clinic [phone] and follow the prompts.  For any non-urgent questions, you may also contact your provider using MyChart. We now offer e-Visits for anyone 80 and older to request care online for non-urgent symptoms. For details visit [email or web address].   Also download the MyChart app! Go to the app store, search "MyChart", open the app, select [place], and log in with your MyChart username and password.

## 2023-04-13 ENCOUNTER — Other Ambulatory Visit: Payer: Self-pay

## 2023-04-13 LAB — CEA: CEA: 131 ng/mL — ABNORMAL HIGH (ref 0.0–4.7)

## 2023-04-14 ENCOUNTER — Inpatient Hospital Stay: Payer: Medicaid Other

## 2023-04-14 VITALS — BP 149/88 | HR 75 | Temp 97.3°F | Resp 18

## 2023-04-14 DIAGNOSIS — C189 Malignant neoplasm of colon, unspecified: Secondary | ICD-10-CM

## 2023-04-14 DIAGNOSIS — Z95828 Presence of other vascular implants and grafts: Secondary | ICD-10-CM

## 2023-04-14 DIAGNOSIS — Z5111 Encounter for antineoplastic chemotherapy: Secondary | ICD-10-CM | POA: Diagnosis not present

## 2023-04-14 MED ORDER — SODIUM CHLORIDE 0.9% FLUSH
10.0000 mL | INTRAVENOUS | Status: DC | PRN
  Administered 2023-04-14: 10 mL

## 2023-04-14 MED ORDER — HEPARIN SOD (PORK) LOCK FLUSH 100 UNIT/ML IV SOLN
500.0000 [IU] | Freq: Once | INTRAVENOUS | Status: AC | PRN
  Administered 2023-04-14: 500 [IU]

## 2023-04-14 NOTE — Progress Notes (Signed)
 Patient presents today for pump d/c. Vital signs are stable. Port a cath site clean, dry, and intact. Port flushed with 10 mls of Normal Saline and 500 Units of Heparin. Needle removed intact. Band aid applied. Patient has no complaints at this time. Discharged from clinic ambulatory and in stable condition. Patient alert and oriented. All follow ups as scheduled.   Diana [employer]

## 2023-04-18 ENCOUNTER — Ambulatory Visit (HOSPITAL_COMMUNITY)
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
## 2023-04-26 ENCOUNTER — Inpatient Hospital Stay

## 2023-04-26 ENCOUNTER — Inpatient Hospital Stay: Admitting: Licensed Clinical Social Worker

## 2023-04-26 ENCOUNTER — Inpatient Hospital Stay (HOSPITAL_BASED_OUTPATIENT_CLINIC_OR_DEPARTMENT_OTHER): Admitting: Hematology

## 2023-04-26 VITALS — BP 140/79 | HR 84 | Temp 98.4°F | Resp 18

## 2023-04-26 VITALS — BP 157/86 | HR 94 | Temp 98.1°F | Resp 20 | Wt 186.1 lb

## 2023-04-26 DIAGNOSIS — C189 Malignant neoplasm of colon, unspecified: Secondary | ICD-10-CM | POA: Diagnosis not present

## 2023-04-26 DIAGNOSIS — Z95828 Presence of other vascular implants and grafts: Secondary | ICD-10-CM

## 2023-04-26 DIAGNOSIS — C7989 Secondary malignant neoplasm of other specified sites: Secondary | ICD-10-CM

## 2023-04-26 DIAGNOSIS — Z5111 Encounter for antineoplastic chemotherapy: Secondary | ICD-10-CM | POA: Diagnosis not present

## 2023-04-26 DIAGNOSIS — E876 Hypokalemia: Secondary | ICD-10-CM

## 2023-04-26 LAB — COMPREHENSIVE METABOLIC PANEL
ALT: 17 U/L (ref 0–44)
AST: 21 U/L (ref 15–41)
Albumin: 3.2 g/dL — ABNORMAL LOW (ref 3.5–5.0)
Alkaline Phosphatase: 59 U/L (ref 38–126)
Anion gap: 11 (ref 5–15)
BUN: 6 mg/dL (ref 6–20)
CO2: 19 mmol/L — ABNORMAL LOW (ref 22–32)
Calcium: 8.4 mg/dL — ABNORMAL LOW (ref 8.9–10.3)
Chloride: 106 mmol/L (ref 98–111)
Creatinine, Ser: 0.69 mg/dL (ref 0.44–1.00)
GFR, Estimated: >60 mL/min (ref >60)
Glucose, Bld: 137 mg/dL — ABNORMAL HIGH (ref 70–99)
Potassium: 3 mmol/L — ABNORMAL LOW (ref 3.5–5.1)
Sodium: 136 mmol/L (ref 135–145)
Total Bilirubin: 0.4 mg/dL (ref 0.0–1.2)
Total Protein: 6.6 g/dL (ref 6.5–8.1)

## 2023-04-26 LAB — CBC WITH DIFFERENTIAL/PLATELET
Abs Immature Granulocytes: 0.02 10*3/uL (ref 0.00–0.07)
Basophils Absolute: 0 10*3/uL (ref 0.0–0.1)
Basophils Relative: 1 %
Eosinophils Absolute: 0.1 10*3/uL (ref 0.0–0.5)
Eosinophils Relative: 1 %
HCT: 33.3 % — ABNORMAL LOW (ref 36.0–46.0)
Hemoglobin: 11.2 g/dL — ABNORMAL LOW (ref 12.0–15.0)
Immature Granulocytes: 1 %
Lymphocytes Relative: 40 %
Lymphs Abs: 1.7 10*3/uL (ref 0.7–4.0)
MCH: 30.1 pg (ref 26.0–34.0)
MCHC: 33.6 g/dL (ref 30.0–36.0)
MCV: 89.5 fL (ref 80.0–100.0)
Monocytes Absolute: 0.6 10*3/uL (ref 0.1–1.0)
Monocytes Relative: 15 %
Neutro Abs: 1.9 10*3/uL (ref 1.7–7.7)
Neutrophils Relative %: 42 %
Platelets: 156 10*3/uL (ref 150–400)
RBC: 3.72 MIL/uL — ABNORMAL LOW (ref 3.87–5.11)
RDW: 17.8 % — ABNORMAL HIGH (ref 11.5–15.5)
WBC: 4.3 10*3/uL (ref 4.0–10.5)
nRBC: 0 % (ref 0.0–0.2)

## 2023-04-26 LAB — URINALYSIS, DIPSTICK ONLY
Bilirubin Urine: NEGATIVE
Glucose, UA: NEGATIVE mg/dL
Hgb urine dipstick: NEGATIVE
Ketones, ur: NEGATIVE mg/dL
Nitrite: NEGATIVE
Protein, ur: 100 mg/dL — AB
Specific Gravity, Urine: 1.015 (ref 1.005–1.030)
pH: 6 (ref 5.0–8.0)

## 2023-04-26 LAB — PREGNANCY, URINE: Preg Test, Ur: NEGATIVE

## 2023-04-26 LAB — MAGNESIUM: Magnesium: 1.5 mg/dL — ABNORMAL LOW (ref 1.7–2.4)

## 2023-04-26 MED ORDER — SODIUM CHLORIDE 0.9 % IV SOLN
5.0000 mg/kg | Freq: Once | INTRAVENOUS | Status: AC
  Administered 2023-04-26: 450 mg via INTRAVENOUS
  Filled 2023-04-26: qty 16

## 2023-04-26 MED ORDER — SODIUM CHLORIDE 0.9% FLUSH
10.0000 mL | Freq: Once | INTRAVENOUS | Status: AC
  Administered 2023-04-26: 10 mL via INTRAVENOUS

## 2023-04-26 MED ORDER — SODIUM CHLORIDE 0.9 % IV SOLN: INTRAVENOUS | Status: DC

## 2023-04-26 MED ORDER — FLUOROURACIL CHEMO INJECTION 2.5 GM/50ML
400.0000 mg/m2 | Freq: Once | INTRAVENOUS | Status: AC
  Administered 2023-04-26: 800 mg via INTRAVENOUS
  Filled 2023-04-26: qty 16

## 2023-04-26 MED ORDER — MAGNESIUM SULFATE 2 GM/50ML IV SOLN
2.0000 g | Freq: Once | INTRAVENOUS | Status: AC
  Administered 2023-04-26: 2 g via INTRAVENOUS
  Filled 2023-04-26: qty 50

## 2023-04-26 MED ORDER — LEUCOVORIN CALCIUM INJECTION 350 MG
400.0000 mg/m2 | Freq: Once | INTRAVENOUS | Status: AC
  Administered 2023-04-26: 804 mg via INTRAVENOUS
  Filled 2023-04-26: qty 40.2

## 2023-04-26 MED ORDER — DEXAMETHASONE SODIUM PHOSPHATE 10 MG/ML IJ SOLN
10.0000 mg | Freq: Once | INTRAMUSCULAR | Status: AC
  Administered 2023-04-26: 10 mg via INTRAVENOUS
  Filled 2023-04-26: qty 1

## 2023-04-26 MED ORDER — DEXTROSE 5 % IV SOLN: INTRAVENOUS | Status: DC

## 2023-04-26 MED ORDER — PALONOSETRON HCL INJECTION 0.25 MG/5ML
0.2500 mg | Freq: Once | INTRAVENOUS | Status: AC
  Administered 2023-04-26: 0.25 mg via INTRAVENOUS
  Filled 2023-04-26: qty 5

## 2023-04-26 MED ORDER — SODIUM CHLORIDE 0.9 % IV SOLN
2400.0000 mg/m2 | INTRAVENOUS | Status: DC
  Administered 2023-04-26: 5000 mg via INTRAVENOUS
  Filled 2023-04-26: qty 100

## 2023-04-26 MED ORDER — POTASSIUM CHLORIDE CRYS ER 20 MEQ PO TBCR
40.0000 meq | EXTENDED_RELEASE_TABLET | Freq: Once | ORAL | Status: AC
  Administered 2023-04-26: 40 meq via ORAL
  Filled 2023-04-26: qty 2

## 2023-04-26 MED ORDER — SODIUM CHLORIDE 0.9 % IV SOLN
150.0000 mg | Freq: Once | INTRAVENOUS | Status: AC
  Administered 2023-04-26: 150 mg via INTRAVENOUS
  Filled 2023-04-26: qty 150

## 2023-04-26 MED ORDER — OXALIPLATIN CHEMO INJECTION 100 MG/20ML
85.0000 mg/m2 | Freq: Once | INTRAVENOUS | Status: AC
  Administered 2023-04-26: 170 mg via INTRAVENOUS
  Filled 2023-04-26: qty 34

## 2023-04-26 NOTE — Patient Instructions (Signed)
 [facility] [place] - A DEPT OF [facility]  Discharge Instructions: Thank you for choosing [facility] to provide your oncology and hematology care.  If you have a lab appointment with the Cancer Center - please note that after April 8th, 2024, all labs will be drawn in the cancer center.  You do not have to check in or register with the main entrance as you have in the past but will complete your check-in in the cancer center.  Wear comfortable clothing and clothing appropriate for easy access to any Portacath or PICC line.   We strive to give you quality time with your provider. You may need to reschedule your appointment if you arrive late (15 or more minutes).  Arriving late affects you and other patients whose appointments are after yours.  Also, if you miss three or more appointments without notifying the office, you may be dismissed from the clinic at the provider's discretion.      For prescription refill requests, have your pharmacy contact our office and allow 72 hours for refills to be completed.    Today you received the following chemotherapy and/or immunotherapy agents Folfox, return as scheduled.   To help prevent nausea and vomiting after your treatment, we encourage you to take your nausea medication as directed.  BELOW ARE SYMPTOMS THAT SHOULD BE REPORTED IMMEDIATELY: *FEVER GREATER THAN 100.4 F (38 C) OR HIGHER *CHILLS OR SWEATING *NAUSEA AND VOMITING THAT IS NOT CONTROLLED WITH YOUR NAUSEA MEDICATION *UNUSUAL SHORTNESS OF BREATH *UNUSUAL BRUISING OR BLEEDING *URINARY PROBLEMS (pain or burning when urinating, or frequent urination) *BOWEL PROBLEMS (unusual diarrhea, constipation, pain near the anus) TENDERNESS IN MOUTH AND THROAT WITH OR WITHOUT PRESENCE OF ULCERS (sore throat, sores in mouth, or a toothache) UNUSUAL RASH, SWELLING OR PAIN  UNUSUAL VAGINAL DISCHARGE OR ITCHING   Items with * indicate a potential emergency and  should be followed up as soon as possible or go to the Emergency Department if any problems should occur.  Please show the CHEMOTHERAPY ALERT CARD or IMMUNOTHERAPY ALERT CARD at check-in to the Emergency Department and triage nurse.  Should you have questions after your visit or need to cancel or reschedule your appointment, please contact [facility] [place] - A DEPT OF [facility] [phone]  and follow the prompts.  Office hours are 8:00 a.m. to 4:30 p.m. Monday - Friday. Please note that voicemails left after 4:00 p.m. may not be returned until the following business day.  We are closed weekends and major holidays. You have access to a nurse at all times for urgent questions. Please call the main number to the clinic [phone] and follow the prompts.  For any non-urgent questions, you may also contact your provider using MyChart. We now offer e-Visits for anyone 25 and older to request care online for non-urgent symptoms. For details visit [email or web address].   Also download the MyChart app! Go to the app store, search "MyChart", open the app, select [facility], and log in with your MyChart username and password.

## 2023-04-26 NOTE — Progress Notes (Signed)
 Patient presents today for treatment labs and patient accessed by Dr. Ellin Saba, patient okay for treatment with urine protein 100. Magnesium 1.5 and potassium 3.0; 2g of magnesium and 40 mEq PO potassium given per standing orders.  Patient tolerated chemotherapy with no complaints voiced. Side effects with management reviewed understanding verbalized. Port site clean and dry with no bruising or swelling noted at site. Good blood return noted before and after administration of chemotherapy. Chemo pump connected with no alarms noted. Patient left in satisfactory condition with VSS and no s/s of distress noted.

## 2023-04-26 NOTE — Patient Instructions (Signed)

## 2023-04-26 NOTE — Progress Notes (Signed)
 Patient has been examined by Dr. Ellin Saba. Vital signs and labs have been reviewed by MD - ANC, Creatinine, LFTs, hemoglobin, and platelets are within treatment parameters per M.D. - pt may proceed with treatment.  Primary RN and pharmacy notified.

## 2023-04-27 ENCOUNTER — Encounter: Payer: Self-pay | Admitting: Hematology

## 2023-04-27 ENCOUNTER — Encounter (HOSPITAL_COMMUNITY): Payer: Self-pay | Admitting: Hematology

## 2023-04-27 ENCOUNTER — Other Ambulatory Visit: Payer: Self-pay

## 2023-04-27 LAB — CEA: CEA: 126 ng/mL — ABNORMAL HIGH (ref 0.0–4.7)

## 2023-04-27 NOTE — Progress Notes (Signed)
 [facility] CSW Progress Note  Visual merchandiser met with patient in infusion.  Pt reports continued financial challenges.  CSW agreed to send an email on behalf of pt to the [employer] to see if they may have any available funds to assist pt at this time.  Pt also inquiring about social security disability.  A referral was made to the [facility] in November 2023 by CSW.  Pt unable to tell CSW what transpired w/ that referral.  CSW to reach out to the [facility] for details and will resend a referral if appropriate.      Rachel Moulds, LCSW Clinical Social Worker [facility]    Patient is participating in a Managed Medicaid Plan:  Yes

## 2023-04-28 ENCOUNTER — Inpatient Hospital Stay

## 2023-04-28 VITALS — BP 155/87 | HR 72 | Temp 97.3°F | Resp 16

## 2023-04-28 DIAGNOSIS — C189 Malignant neoplasm of colon, unspecified: Secondary | ICD-10-CM

## 2023-04-28 DIAGNOSIS — Z95828 Presence of other vascular implants and grafts: Secondary | ICD-10-CM

## 2023-04-28 DIAGNOSIS — Z5111 Encounter for antineoplastic chemotherapy: Secondary | ICD-10-CM | POA: Diagnosis not present

## 2023-04-28 MED ORDER — SODIUM CHLORIDE 0.9% FLUSH
10.0000 mL | INTRAVENOUS | Status: DC | PRN
  Administered 2023-04-28: 10 mL

## 2023-04-28 MED ORDER — HEPARIN SOD (PORK) LOCK FLUSH 100 UNIT/ML IV SOLN
500.0000 [IU] | Freq: Once | INTRAVENOUS | Status: AC | PRN
  Administered 2023-04-28: 500 [IU]

## 2023-04-28 NOTE — Patient Instructions (Signed)
 [facility] [place] - A DEPT [facility]  Discharge Instructions: Thank you for choosing [facility] to provide your oncology and hematology care.  If you have a lab appointment with the Cancer Center - please note that after April 8th, 2024, all labs will be drawn in the cancer center.  You do not have to check in or register with the main entrance as you have in the past but will complete your check-in in the cancer center.  Wear comfortable clothing and clothing appropriate for easy access to any Portacath or PICC line.   We strive to give you quality time with your provider. You may need to reschedule your appointment if you arrive late (15 or more minutes).  Arriving late affects you and other patients whose appointments are after yours.  Also, if you miss three or more appointments without notifying the office, you may be dismissed from the clinic at the provider's discretion.      For prescription refill requests, have your pharmacy contact our office and allow 72 hours for refills to be completed.    Today you received the following chemo pump disconnected and port flushed.   To help prevent nausea and vomiting after your treatment, we encourage you to take your nausea medication as directed.  BELOW ARE SYMPTOMS THAT SHOULD BE REPORTED IMMEDIATELY: *FEVER GREATER THAN 100.4 F (38 C) OR HIGHER *CHILLS OR SWEATING *NAUSEA AND VOMITING THAT IS NOT CONTROLLED WITH YOUR NAUSEA MEDICATION *UNUSUAL SHORTNESS OF BREATH *UNUSUAL BRUISING OR BLEEDING *URINARY PROBLEMS (pain or burning when urinating, or frequent urination) *BOWEL PROBLEMS (unusual diarrhea, constipation, pain near the anus) TENDERNESS IN MOUTH AND THROAT WITH OR WITHOUT PRESENCE OF ULCERS (sore throat, sores in mouth, or a toothache) UNUSUAL RASH, SWELLING OR PAIN  UNUSUAL VAGINAL DISCHARGE OR ITCHING   Items with * indicate a potential emergency and should be followed up as soon as  possible or go to the Emergency Department if any problems should occur.  Please show the CHEMOTHERAPY ALERT CARD or IMMUNOTHERAPY ALERT CARD at check-in to the Emergency Department and triage nurse.  Should you have questions after your visit or need to cancel or reschedule your appointment, please contact [facility] [place] - A DEPT OF Eligha Bridegroom [facility] [phone]  and follow the prompts.  Office hours are 8:00 a.m. to 4:30 p.m. Monday - Friday. Please note that voicemails left after 4:00 p.m. may not be returned until the following business day.  We are closed weekends and major holidays. You have access to a nurse at all times for urgent questions. Please call the main number to the clinic [phone] and follow the prompts.  For any non-urgent questions, you may also contact your provider using MyChart. We now offer e-Visits for anyone 34 and older to request care online for non-urgent symptoms. For details visit [email or web address].   Also download the MyChart app! Go to the app store, search "MyChart", open the app, select [place], and log in with your MyChart username and password.

## 2023-04-28 NOTE — Progress Notes (Signed)
 Upon disconnected chemo pump patient's bandage was coming off patient, not covering insertion site, patient unsure of when bandage started to come off, patient made aware to monitor for signs of infection and call cancer center if symptoms arise. Chemo pump disconnected, Port flushed with good blood return noted. No bruising or swelling at site. Bandaid applied and patient discharged in satisfactory condition. VVS stable with no signs or symptoms of distressed noted.

## 2023-05-05 ENCOUNTER — Inpatient Hospital Stay: Admitting: Licensed Clinical Social Worker

## 2023-05-05 DIAGNOSIS — C189 Malignant neoplasm of colon, unspecified: Secondary | ICD-10-CM

## 2023-05-05 NOTE — Progress Notes (Signed)
 [facility] CSW Progress Note  Clinical Child psychotherapist  received instruction from the [employer] to resubmit a referral on behalf of pt given the amount of time that has passed since the original referral was sent.  CSW submitted referral to the [facility] on behalf of pt.  Pt notified of the above.        Rachel Moulds, LCSW Clinical Social Worker [facility]    Patient is participating in a Managed Medicaid Plan:  Yes

## 2023-05-08 ENCOUNTER — Ambulatory Visit (HOSPITAL_COMMUNITY)

## 2023-05-14 ENCOUNTER — Other Ambulatory Visit: Payer: Self-pay | Admitting: Hematology

## 2023-05-15 ENCOUNTER — Encounter (HOSPITAL_COMMUNITY): Payer: Self-pay | Admitting: Hematology

## 2023-05-15 ENCOUNTER — Encounter: Payer: Self-pay | Admitting: Hematology

## 2023-05-15 ENCOUNTER — Inpatient Hospital Stay: Admitting: Oncology

## 2023-05-15 ENCOUNTER — Inpatient Hospital Stay

## 2023-05-15 ENCOUNTER — Inpatient Hospital Stay: Attending: Hematology

## 2023-05-15 VITALS — BP 145/77 | HR 86 | Temp 98.4°F | Resp 17

## 2023-05-15 DIAGNOSIS — Z95828 Presence of other vascular implants and grafts: Secondary | ICD-10-CM

## 2023-05-15 DIAGNOSIS — Z1732 Human epidermal growth factor receptor 2 negative status: Secondary | ICD-10-CM | POA: Diagnosis not present

## 2023-05-15 DIAGNOSIS — Z793 Long term (current) use of hormonal contraceptives: Secondary | ICD-10-CM | POA: Diagnosis not present

## 2023-05-15 DIAGNOSIS — K648 Other hemorrhoids: Secondary | ICD-10-CM | POA: Insufficient documentation

## 2023-05-15 DIAGNOSIS — I10 Essential (primary) hypertension: Secondary | ICD-10-CM | POA: Diagnosis not present

## 2023-05-15 DIAGNOSIS — E876 Hypokalemia: Secondary | ICD-10-CM | POA: Diagnosis not present

## 2023-05-15 DIAGNOSIS — Z79899 Other long term (current) drug therapy: Secondary | ICD-10-CM | POA: Insufficient documentation

## 2023-05-15 DIAGNOSIS — C187 Malignant neoplasm of sigmoid colon: Secondary | ICD-10-CM | POA: Diagnosis not present

## 2023-05-15 DIAGNOSIS — C189 Malignant neoplasm of colon, unspecified: Secondary | ICD-10-CM

## 2023-05-15 DIAGNOSIS — K56699 Other intestinal obstruction unspecified as to partial versus complete obstruction: Secondary | ICD-10-CM | POA: Diagnosis not present

## 2023-05-15 DIAGNOSIS — G629 Polyneuropathy, unspecified: Secondary | ICD-10-CM | POA: Diagnosis not present

## 2023-05-15 DIAGNOSIS — R599 Enlarged lymph nodes, unspecified: Secondary | ICD-10-CM | POA: Insufficient documentation

## 2023-05-15 DIAGNOSIS — Z5111 Encounter for antineoplastic chemotherapy: Secondary | ICD-10-CM | POA: Insufficient documentation

## 2023-05-15 LAB — URINALYSIS, DIPSTICK ONLY
Bilirubin Urine: NEGATIVE
Glucose, UA: NEGATIVE mg/dL
Hgb urine dipstick: NEGATIVE
Ketones, ur: NEGATIVE mg/dL
Leukocytes,Ua: NEGATIVE
Nitrite: NEGATIVE
Protein, ur: 30 mg/dL — AB
Specific Gravity, Urine: 1.016 (ref 1.005–1.030)
pH: 5 (ref 5.0–8.0)

## 2023-05-15 LAB — CBC WITH DIFFERENTIAL/PLATELET
Abs Immature Granulocytes: 0.22 10*3/uL — ABNORMAL HIGH (ref 0.00–0.07)
Basophils Absolute: 0.1 10*3/uL (ref 0.0–0.1)
Basophils Relative: 1 %
Eosinophils Absolute: 0.1 10*3/uL (ref 0.0–0.5)
Eosinophils Relative: 1 %
HCT: 33.9 % — ABNORMAL LOW (ref 36.0–46.0)
Hemoglobin: 11.5 g/dL — ABNORMAL LOW (ref 12.0–15.0)
Immature Granulocytes: 3 %
Lymphocytes Relative: 32 %
Lymphs Abs: 2.1 10*3/uL (ref 0.7–4.0)
MCH: 31.4 pg (ref 26.0–34.0)
MCHC: 33.9 g/dL (ref 30.0–36.0)
MCV: 92.6 fL (ref 80.0–100.0)
Monocytes Absolute: 1 10*3/uL (ref 0.1–1.0)
Monocytes Relative: 15 %
Neutro Abs: 3.1 10*3/uL (ref 1.7–7.7)
Neutrophils Relative %: 48 %
Platelets: 235 10*3/uL (ref 150–400)
RBC: 3.66 MIL/uL — ABNORMAL LOW (ref 3.87–5.11)
RDW: 19.7 % — ABNORMAL HIGH (ref 11.5–15.5)
WBC: 6.6 10*3/uL (ref 4.0–10.5)
nRBC: 0 % (ref 0.0–0.2)

## 2023-05-15 LAB — COMPREHENSIVE METABOLIC PANEL WITH GFR
ALT: 13 U/L (ref 0–44)
AST: 24 U/L (ref 15–41)
Albumin: 3.2 g/dL — ABNORMAL LOW (ref 3.5–5.0)
Alkaline Phosphatase: 56 U/L (ref 38–126)
Anion gap: 10 (ref 5–15)
BUN: 8 mg/dL (ref 6–20)
CO2: 18 mmol/L — ABNORMAL LOW (ref 22–32)
Calcium: 8.8 mg/dL — ABNORMAL LOW (ref 8.9–10.3)
Chloride: 109 mmol/L (ref 98–111)
Creatinine, Ser: 0.67 mg/dL (ref 0.44–1.00)
GFR, Estimated: >60 mL/min (ref >60)
Glucose, Bld: 96 mg/dL (ref 70–99)
Potassium: 3.5 mmol/L (ref 3.5–5.1)
Sodium: 137 mmol/L (ref 135–145)
Total Bilirubin: 0.6 mg/dL (ref 0.0–1.2)
Total Protein: 6.9 g/dL (ref 6.5–8.1)

## 2023-05-15 LAB — MAGNESIUM: Magnesium: 1.7 mg/dL (ref 1.7–2.4)

## 2023-05-15 MED ORDER — SODIUM CHLORIDE 0.9% FLUSH
10.0000 mL | Freq: Once | INTRAVENOUS | Status: AC
  Administered 2023-05-15: 10 mL via INTRAVENOUS

## 2023-05-15 MED ORDER — SODIUM CHLORIDE 0.9 % IV SOLN: INTRAVENOUS | Status: DC

## 2023-05-15 MED ORDER — FLUOROURACIL CHEMO INJECTION 2.5 GM/50ML
400.0000 mg/m2 | Freq: Once | INTRAVENOUS | Status: AC
  Administered 2023-05-15: 800 mg via INTRAVENOUS
  Filled 2023-05-15: qty 16

## 2023-05-15 MED ORDER — OXALIPLATIN CHEMO INJECTION 100 MG/20ML
85.0000 mg/m2 | Freq: Once | INTRAVENOUS | Status: AC
  Administered 2023-05-15: 170 mg via INTRAVENOUS
  Filled 2023-05-15: qty 34

## 2023-05-15 MED ORDER — SODIUM CHLORIDE 0.9 % IV SOLN
2400.0000 mg/m2 | INTRAVENOUS | Status: DC
  Administered 2023-05-15: 5000 mg via INTRAVENOUS
  Filled 2023-05-15: qty 100

## 2023-05-15 MED ORDER — SODIUM CHLORIDE 0.9 % IV SOLN
150.0000 mg | Freq: Once | INTRAVENOUS | Status: AC
  Administered 2023-05-15: 150 mg via INTRAVENOUS
  Filled 2023-05-15: qty 150

## 2023-05-15 MED ORDER — SODIUM CHLORIDE 0.9 % IV SOLN
5.0000 mg/kg | Freq: Once | INTRAVENOUS | Status: AC
  Administered 2023-05-15: 450 mg via INTRAVENOUS
  Filled 2023-05-15: qty 16

## 2023-05-15 MED ORDER — DEXTROSE 5 % IV SOLN: INTRAVENOUS | Status: DC

## 2023-05-15 MED ORDER — PALONOSETRON HCL INJECTION 0.25 MG/5ML
0.2500 mg | Freq: Once | INTRAVENOUS | Status: AC
  Administered 2023-05-15: 0.25 mg via INTRAVENOUS
  Filled 2023-05-15: qty 5

## 2023-05-15 MED ORDER — DEXAMETHASONE SODIUM PHOSPHATE 10 MG/ML IJ SOLN
10.0000 mg | Freq: Once | INTRAMUSCULAR | Status: AC
  Administered 2023-05-15: 10 mg via INTRAVENOUS
  Filled 2023-05-15: qty 1

## 2023-05-15 MED ORDER — LEUCOVORIN CALCIUM INJECTION 350 MG
400.0000 mg/m2 | Freq: Once | INTRAVENOUS | Status: AC
  Administered 2023-05-15: 804 mg via INTRAVENOUS
  Filled 2023-05-15: qty 40.2

## 2023-05-15 NOTE — Progress Notes (Signed)
 Patient presents today for Vegzelma/Folfox infusion.  Patient is in satisfactory condition with no new complaints voiced.  Vital signs are stable.  Labs reviewed and all labs are within treatment parameters.  We will proceed with treatment per MD orders.

## 2023-05-15 NOTE — Patient Instructions (Signed)
 [facility] [place] [facility]  Discharge Instructions: Thank you for choosing [facility] to provide your oncology and hematology care.  If you have a lab appointment with the Cancer Center - please note that after April 8th, 2024, all labs will be drawn in the cancer center.  You do not have to check in or register with the main entrance as you have in the past but will complete your check-in in the cancer center.  Wear comfortable clothing and clothing appropriate for easy access to any Portacath or PICC line.   We strive to give you quality time with your provider. You may need to reschedule your appointment if you arrive late (15 or more minutes).  Arriving late affects you and other patients whose appointments are after yours.  Also, if you miss three or more appointments without notifying the office, you may be dismissed from the clinic at the provider's discretion.      For prescription refill requests, have your pharmacy contact our office and allow 72 hours for refills to be completed.    Today you received the following chemotherapy and/or immunotherapy agents Folfox. The chemotherapy medication bag should finish at 46 hours, 96 hours, or 7 days. For example, if your pump is scheduled for 46 hours and it was put on at 4:00 p.m., it should finish at 2:00 p.m. the day it is scheduled to come off regardless of your appointment time.     Estimated time to finish at 13:20 pm. .   If the display on your pump reads "Low Volume" and it is beeping, take the batteries out of the pump and come to the cancer center for it to be taken off.   If the pump alarms go off prior to the pump reading "Low Volume" then call [phone] and someone can assist you.  If the plunger comes out and the chemotherapy medication is leaking out, please use your home chemo spill kit to clean up the spill. Do NOT use paper towels or other household products.  If  you have problems or questions regarding your pump, please call either [phone] (24 hours a day) or the cancer center Monday-Friday 8:00 a.m.- 4:30 p.m. at the clinic number and we will assist you. If you are unable to get assistance, then go to the nearest Emergency Department and ask the staff to contact the IV team for assistance.        To help prevent nausea and vomiting after your treatment, we encourage you to take your nausea medication as directed.  BELOW ARE SYMPTOMS THAT SHOULD BE REPORTED IMMEDIATELY: *FEVER GREATER THAN 100.4 F (38 C) OR HIGHER *CHILLS OR SWEATING *NAUSEA AND VOMITING THAT IS NOT CONTROLLED WITH YOUR NAUSEA MEDICATION *UNUSUAL SHORTNESS OF BREATH *UNUSUAL BRUISING OR BLEEDING *URINARY PROBLEMS (pain or burning when urinating, or frequent urination) *BOWEL PROBLEMS (unusual diarrhea, constipation, pain near the anus) TENDERNESS IN MOUTH AND THROAT WITH OR WITHOUT PRESENCE OF ULCERS (sore throat, sores in mouth, or a toothache) UNUSUAL RASH, SWELLING OR PAIN  UNUSUAL VAGINAL DISCHARGE OR ITCHING   Items with * indicate a potential emergency and should be followed up as soon as possible or go to the Emergency Department if any problems should occur.  Please show the CHEMOTHERAPY ALERT CARD or IMMUNOTHERAPY ALERT CARD at check-in to the Emergency Department and triage nurse.  Should you have questions after your visit or need to cancel or reschedule  your appointment, please contact [facility]CER CTR [place] - A DEPT OF Eligha Bridegroom [facility] [phone]  and follow the prompts.  Office hours are 8:00 a.m. to 4:30 p.m. Monday - Friday. Please note that voicemails left after 4:00 p.m. may not be returned until the following business day.  We are closed weekends and major holidays. You have access to a nurse at all times for urgent questions. Please call the main number to the clinic [phone] and follow the prompts.  For any non-urgent questions, you  may also contact your provider using MyChart. We now offer e-Visits for anyone 73 and older to request care online for non-urgent symptoms. For details visit [email or web address].   Also download the MyChart app! Go to the app store, search "MyChart", open the app, select [place], and log in with your MyChart username and password.

## 2023-05-15 NOTE — Progress Notes (Signed)
 Folfox given today per MD orders. Tolerated infusion without adverse affects. Vital signs stable. No complaints at this time. Pump placed on patient and RUN noted on screen. Verified with patient. Discharged from clinic ambulatory in stable condition. Alert and oriented x 3. F/U with [facility] as scheduled.

## 2023-05-16 LAB — CEA: CEA: 149 ng/mL — ABNORMAL HIGH (ref 0.0–4.7)

## 2023-05-17 ENCOUNTER — Inpatient Hospital Stay

## 2023-05-17 VITALS — BP 147/89 | HR 82 | Temp 97.8°F | Resp 18

## 2023-05-17 DIAGNOSIS — C189 Malignant neoplasm of colon, unspecified: Secondary | ICD-10-CM

## 2023-05-17 DIAGNOSIS — Z95828 Presence of other vascular implants and grafts: Secondary | ICD-10-CM

## 2023-05-17 DIAGNOSIS — Z5111 Encounter for antineoplastic chemotherapy: Secondary | ICD-10-CM | POA: Diagnosis not present

## 2023-05-17 MED ORDER — SODIUM CHLORIDE 0.9% FLUSH
10.0000 mL | INTRAVENOUS | Status: DC | PRN
Start: 2023-05-17 — End: 2023-05-17
  Administered 2023-05-17: 10 mL

## 2023-05-17 MED ORDER — HEPARIN SOD (PORK) LOCK FLUSH 100 UNIT/ML IV SOLN
500.0000 [IU] | Freq: Once | INTRAVENOUS | Status: AC | PRN
Start: 2023-05-17 — End: 2023-05-17
  Administered 2023-05-17: 500 [IU]

## 2023-05-17 NOTE — Progress Notes (Signed)
 Patient presents today for 5FU chemotherapy pump disconnection per provider's order. Vital signs stable and patient voiced no new complaints at this time.   Port flushed easily with 10 mL of normal saline and 5 mL of heparin. Good blood return noted and needle removed intact. No bruising or swelling noted at the site.  Discharged from clinic ambulatory in stable condition. Alert and oriented x 3. F/U with [facility] as scheduled.

## 2023-05-19 ENCOUNTER — Ambulatory Visit (HOSPITAL_COMMUNITY)
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
## 2023-05-31 ENCOUNTER — Inpatient Hospital Stay (HOSPITAL_BASED_OUTPATIENT_CLINIC_OR_DEPARTMENT_OTHER): Admitting: Hematology

## 2023-05-31 ENCOUNTER — Encounter: Payer: Self-pay | Admitting: Hematology

## 2023-05-31 ENCOUNTER — Inpatient Hospital Stay

## 2023-05-31 VITALS — BP 142/87 | HR 91 | Temp 97.5°F | Resp 18

## 2023-05-31 VITALS — BP 128/85 | Wt 179.9 lb

## 2023-05-31 VITALS — BP 132/84 | HR 77 | Temp 97.3°F | Resp 18

## 2023-05-31 DIAGNOSIS — E876 Hypokalemia: Secondary | ICD-10-CM

## 2023-05-31 DIAGNOSIS — Z5111 Encounter for antineoplastic chemotherapy: Secondary | ICD-10-CM | POA: Diagnosis not present

## 2023-05-31 DIAGNOSIS — C189 Malignant neoplasm of colon, unspecified: Secondary | ICD-10-CM

## 2023-05-31 DIAGNOSIS — Z95828 Presence of other vascular implants and grafts: Secondary | ICD-10-CM

## 2023-05-31 LAB — URINALYSIS, DIPSTICK ONLY
Bilirubin Urine: NEGATIVE
Glucose, UA: NEGATIVE mg/dL
Hgb urine dipstick: NEGATIVE
Ketones, ur: NEGATIVE mg/dL
Leukocytes,Ua: NEGATIVE
Nitrite: NEGATIVE
Protein, ur: 30 mg/dL — AB
Specific Gravity, Urine: 1.017 (ref 1.005–1.030)
pH: 6 (ref 5.0–8.0)

## 2023-05-31 LAB — CBC WITH DIFFERENTIAL/PLATELET
Abs Immature Granulocytes: 0.02 10*3/uL (ref 0.00–0.07)
Basophils Absolute: 0 10*3/uL (ref 0.0–0.1)
Basophils Relative: 1 %
Eosinophils Absolute: 0 10*3/uL (ref 0.0–0.5)
Eosinophils Relative: 1 %
HCT: 32.1 % — ABNORMAL LOW (ref 36.0–46.0)
Hemoglobin: 10.8 g/dL — ABNORMAL LOW (ref 12.0–15.0)
Immature Granulocytes: 1 %
Lymphocytes Relative: 38 %
Lymphs Abs: 1.4 10*3/uL (ref 0.7–4.0)
MCH: 31.6 pg (ref 26.0–34.0)
MCHC: 33.6 g/dL (ref 30.0–36.0)
MCV: 93.9 fL (ref 80.0–100.0)
Monocytes Absolute: 0.6 10*3/uL (ref 0.1–1.0)
Monocytes Relative: 17 %
Neutro Abs: 1.6 10*3/uL — ABNORMAL LOW (ref 1.7–7.7)
Neutrophils Relative %: 42 %
Platelets: 200 10*3/uL (ref 150–400)
RBC: 3.42 MIL/uL — ABNORMAL LOW (ref 3.87–5.11)
RDW: 18.1 % — ABNORMAL HIGH (ref 11.5–15.5)
WBC: 3.7 10*3/uL — ABNORMAL LOW (ref 4.0–10.5)
nRBC: 0 % (ref 0.0–0.2)

## 2023-05-31 LAB — COMPREHENSIVE METABOLIC PANEL WITH GFR
ALT: 13 U/L (ref 0–44)
AST: 25 U/L (ref 15–41)
Albumin: 3.2 g/dL — ABNORMAL LOW (ref 3.5–5.0)
Alkaline Phosphatase: 56 U/L (ref 38–126)
Anion gap: 10 (ref 5–15)
BUN: 6 mg/dL (ref 6–20)
CO2: 18 mmol/L — ABNORMAL LOW (ref 22–32)
Calcium: 8.7 mg/dL — ABNORMAL LOW (ref 8.9–10.3)
Chloride: 109 mmol/L (ref 98–111)
Creatinine, Ser: 0.73 mg/dL (ref 0.44–1.00)
GFR, Estimated: >60 mL/min (ref >60)
Glucose, Bld: 132 mg/dL — ABNORMAL HIGH (ref 70–99)
Potassium: 3.3 mmol/L — ABNORMAL LOW (ref 3.5–5.1)
Sodium: 137 mmol/L (ref 135–145)
Total Bilirubin: 0.4 mg/dL (ref 0.0–1.2)
Total Protein: 6.6 g/dL (ref 6.5–8.1)

## 2023-05-31 LAB — MAGNESIUM: Magnesium: 1.6 mg/dL — ABNORMAL LOW (ref 1.7–2.4)

## 2023-05-31 MED ORDER — OXALIPLATIN CHEMO INJECTION 100 MG/20ML
85.0000 mg/m2 | Freq: Once | INTRAVENOUS | Status: AC
  Administered 2023-05-31: 170 mg via INTRAVENOUS
  Filled 2023-05-31: qty 34

## 2023-05-31 MED ORDER — MAGNESIUM SULFATE 2 GM/50ML IV SOLN
2.0000 g | Freq: Once | INTRAVENOUS | Status: AC
  Administered 2023-05-31: 2 g via INTRAVENOUS
  Filled 2023-05-31: qty 50

## 2023-05-31 MED ORDER — DEXTROSE 5 % IV SOLN
400.0000 mg/m2 | Freq: Once | INTRAVENOUS | Status: AC
  Administered 2023-05-31: 804 mg via INTRAVENOUS
  Filled 2023-05-31: qty 40.2

## 2023-05-31 MED ORDER — FLUOROURACIL CHEMO INJECTION 2.5 GM/50ML
400.0000 mg/m2 | Freq: Once | INTRAVENOUS | Status: AC
  Administered 2023-05-31: 800 mg via INTRAVENOUS
  Filled 2023-05-31: qty 16

## 2023-05-31 MED ORDER — SODIUM CHLORIDE 0.9 % IV SOLN
5.0000 mg/kg | Freq: Once | INTRAVENOUS | Status: AC
  Administered 2023-05-31: 450 mg via INTRAVENOUS
  Filled 2023-05-31: qty 16

## 2023-05-31 MED ORDER — DEXAMETHASONE SODIUM PHOSPHATE 10 MG/ML IJ SOLN
10.0000 mg | Freq: Once | INTRAMUSCULAR | Status: AC
  Administered 2023-05-31: 10 mg via INTRAVENOUS
  Filled 2023-05-31: qty 1

## 2023-05-31 MED ORDER — SODIUM CHLORIDE 0.9 % IV SOLN: INTRAVENOUS | Status: DC

## 2023-05-31 MED ORDER — SODIUM CHLORIDE 0.9% FLUSH
10.0000 mL | INTRAVENOUS | Status: DC | PRN
  Administered 2023-05-31: 10 mL via INTRAVENOUS

## 2023-05-31 MED ORDER — PALONOSETRON HCL INJECTION 0.25 MG/5ML
0.2500 mg | Freq: Once | INTRAVENOUS | Status: AC
  Administered 2023-05-31: 0.25 mg via INTRAVENOUS
  Filled 2023-05-31: qty 5

## 2023-05-31 MED ORDER — SODIUM CHLORIDE 0.9 % IV SOLN
2400.0000 mg/m2 | INTRAVENOUS | Status: DC
  Administered 2023-05-31: 5000 mg via INTRAVENOUS
  Filled 2023-05-31: qty 100

## 2023-05-31 MED ORDER — SODIUM CHLORIDE 0.9 % IV SOLN
150.0000 mg | Freq: Once | INTRAVENOUS | Status: AC
  Administered 2023-05-31: 150 mg via INTRAVENOUS
  Filled 2023-05-31: qty 150

## 2023-05-31 MED ORDER — DEXTROSE 5 % IV SOLN: INTRAVENOUS | Status: DC

## 2023-05-31 MED ORDER — POTASSIUM CHLORIDE CRYS ER 20 MEQ PO TBCR
40.0000 meq | EXTENDED_RELEASE_TABLET | Freq: Once | ORAL | Status: AC
  Administered 2023-05-31: 40 meq via ORAL
  Filled 2023-05-31: qty 2

## 2023-05-31 NOTE — Patient Instructions (Signed)
 [facility] [place] - A DEPT OF [facility]  Discharge Instructions: Thank you for choosing [facility] to provide your oncology and hematology care.  If you have a lab appointment with the Cancer Center - please note that after April 8th, 2024, all labs will be drawn in the cancer center.  You do not have to check in or register with the main entrance as you have in the past but will complete your check-in in the cancer center. The chemotherapy medication bag should finish at 46 hours, 96 hours, or 7 days. For example, if your pump is scheduled for 46 hours and it was put on at 4:00 p.m., it should finish at 2:00 p.m. the day it is scheduled to come off regardless of your appointment time.     Estimated time to finish at 13:00 pm.   If the display on your pump reads "Low Volume" and it is beeping, take the batteries out of the pump and come to the cancer center for it to be taken off.   If the pump alarms go off prior to the pump reading "Low Volume" then call [phone] and someone can assist you.  If the plunger comes out and the chemotherapy medication is leaking out, please use your home chemo spill kit to clean up the spill. Do NOT use paper towels or other household products.  If you have problems or questions regarding your pump, please call either [phone] (24 hours a day) or the cancer center Monday-Friday 8:00 a.m.- 4:30 p.m. at the clinic number and we will assist you. If you are unable to get assistance, then go to the nearest Emergency Department and ask the staff to contact the IV team for assistance.   Wear comfortable clothing and clothing appropriate for easy access to any Portacath or PICC line.   We strive to give you quality time with your provider. You may need to reschedule your appointment if you arrive late (15 or more minutes).  Arriving late affects you and other patients whose appointments are after yours.  Also, if you miss  three or more appointments without notifying the office, you may be dismissed from the clinic at the provider's discretion.      For prescription refill requests, have your pharmacy contact our office and allow 72 hours for refills to be completed.    Today you received the following chemotherapy and/or immunotherapy agents Adrucil .       To help prevent nausea and vomiting after your treatment, we encourage you to take your nausea medication as directed.  BELOW ARE SYMPTOMS THAT SHOULD BE REPORTED IMMEDIATELY: *FEVER GREATER THAN 100.4 F (38 C) OR HIGHER *CHILLS OR SWEATING *NAUSEA AND VOMITING THAT IS NOT CONTROLLED WITH YOUR NAUSEA MEDICATION *UNUSUAL SHORTNESS OF BREATH *UNUSUAL BRUISING OR BLEEDING *URINARY PROBLEMS (pain or burning when urinating, or frequent urination) *BOWEL PROBLEMS (unusual diarrhea, constipation, pain near the anus) TENDERNESS IN MOUTH AND THROAT WITH OR WITHOUT PRESENCE OF ULCERS (sore throat, sores in mouth, or a toothache) UNUSUAL RASH, SWELLING OR PAIN  UNUSUAL VAGINAL DISCHARGE OR ITCHING   Items with * indicate a potential emergency and should be followed up as soon as possible or go to the Emergency Department if any problems should occur.  Please show the CHEMOTHERAPY ALERT CARD or IMMUNOTHERAPY ALERT CARD at check-in to the Emergency Department and triage nurse.  Should you have questions after your visit or need to cancel or reschedule your  appointment, please contact [facility] [place] - A DEPT OF [facility] [phone]  and follow the prompts.  Office hours are 8:00 a.m. to 4:30 p.m. Monday - Friday. Please note that voicemails left after 4:00 p.m. may not be returned until the following business day.  We are closed weekends and major holidays. You have access to a nurse at all times for urgent questions. Please call the main number to the clinic [phone] and follow the prompts.  For any non-urgent questions, you may  also contact your provider using MyChart. We now offer e-Visits for anyone 18 and older to request care online for non-urgent symptoms. For details visit [email or web address].   Also download the MyChart app! Go to the app store, search "MyChart", open the app, select [place], and log in with your MyChart username and password.

## 2023-05-31 NOTE — Progress Notes (Signed)
 Patients port flushed without difficulty.  Good blood return noted with no bruising or swelling noted at site.  Patient remains accessed for treatment.

## 2023-05-31 NOTE — Progress Notes (Signed)
 Treatment given today per MD orders. Tolerated infusion without adverse affects. Vital signs stable. No complaints at this time. Discharged from clinic ambulatory in stable condition. Pump noted to be infusing with RUN noted on screen and verified with patient. Alert and oriented x 3. F/U with [facility] as scheduled.

## 2023-05-31 NOTE — Progress Notes (Signed)
 Maintain vegzelma  dose at 450 mg today.  V.O. Dr Davina Ester, PharmD

## 2023-05-31 NOTE — Patient Instructions (Signed)

## 2023-06-01 ENCOUNTER — Other Ambulatory Visit: Payer: Self-pay

## 2023-06-01 LAB — CEA: CEA: 132 ng/mL — ABNORMAL HIGH (ref 0.0–4.7)

## 2023-06-02 ENCOUNTER — Inpatient Hospital Stay

## 2023-06-02 VITALS — BP 139/85 | HR 75 | Temp 98.1°F | Resp 18

## 2023-06-02 DIAGNOSIS — Z95828 Presence of other vascular implants and grafts: Secondary | ICD-10-CM

## 2023-06-02 DIAGNOSIS — C189 Malignant neoplasm of colon, unspecified: Secondary | ICD-10-CM

## 2023-06-02 DIAGNOSIS — Z5111 Encounter for antineoplastic chemotherapy: Secondary | ICD-10-CM | POA: Diagnosis not present

## 2023-06-02 MED ORDER — HEPARIN SOD (PORK) LOCK FLUSH 100 UNIT/ML IV SOLN
500.0000 [IU] | Freq: Once | INTRAVENOUS | Status: AC | PRN
  Administered 2023-06-02: 500 [IU]

## 2023-06-02 MED ORDER — SODIUM CHLORIDE 0.9% FLUSH
10.0000 mL | INTRAVENOUS | Status: DC | PRN
  Administered 2023-06-02: 10 mL

## 2023-06-02 NOTE — Patient Instructions (Signed)
 [facility] [place] - A DEPT OF [facility]  Discharge Instructions: Thank you for choosing [facility] to provide your oncology and hematology care.  If you have a lab appointment with the Cancer Center - please note that after April 8th, 2024, all labs will be drawn in the cancer center.  You do not have to check in or register with the main entrance as you have in the past but will complete your check-in in the cancer center.  Wear comfortable clothing and clothing appropriate for easy access to any Portacath or PICC line.   We strive to give you quality time with your provider. You may need to reschedule your appointment if you arrive late (15 or more minutes).  Arriving late affects you and other patients whose appointments are after yours.  Also, if you miss three or more appointments without notifying the office, you may be dismissed from the clinic at the provider's discretion.      For prescription refill requests, have your pharmacy contact our office and allow 72 hours for refills to be completed.    Today you received the following chemotherapy and/or immunotherapy agents pump stop      To help prevent nausea and vomiting after your treatment, we encourage you to take your nausea medication as directed.  BELOW ARE SYMPTOMS THAT SHOULD BE REPORTED IMMEDIATELY: *FEVER GREATER THAN 100.4 F (38 C) OR HIGHER *CHILLS OR SWEATING *NAUSEA AND VOMITING THAT IS NOT CONTROLLED WITH YOUR NAUSEA MEDICATION *UNUSUAL SHORTNESS OF BREATH *UNUSUAL BRUISING OR BLEEDING *URINARY PROBLEMS (pain or burning when urinating, or frequent urination) *BOWEL PROBLEMS (unusual diarrhea, constipation, pain near the anus) TENDERNESS IN MOUTH AND THROAT WITH OR WITHOUT PRESENCE OF ULCERS (sore throat, sores in mouth, or a toothache) UNUSUAL RASH, SWELLING OR PAIN  UNUSUAL VAGINAL DISCHARGE OR ITCHING   Items with * indicate a potential emergency and should be followed up  as soon as possible or go to the Emergency Department if any problems should occur.  Please show the CHEMOTHERAPY ALERT CARD or IMMUNOTHERAPY ALERT CARD at check-in to the Emergency Department and triage nurse.  Should you have questions after your visit or need to cancel or reschedule your appointment, please contact [facility] [place] - A DEPT OF Eligha [facility]ideg[facility] [phone]  and follow the prompts.  Office hours are 8:00 a.m. to 4:30 p.m. Monday - Friday. Please note that voicemails left after 4:00 p.m. may not be returned until the following business day.  We are closed weekends and major holidays. You have access to a nurse at all times for urgent questions. Please call the main number to the clinic [phone] and follow the prompts.  For any non-urgent questions, you may also contact your provider using MyChart. We now offer e-Visits for anyone 60 and older to request care online for non-urgent symptoms. For details visit [email or web address].   Also download the MyChart app! Go to the app store, search "MyChart", open the app, select [place], and log in with your MyChart username and password.

## 2023-06-02 NOTE — Progress Notes (Signed)
 Patient presents today for 5FU pump stop and disconnection after 46 hour continous infusion.   5FU pump deaccessed.  Patients port flushed without difficulty.  Good blood return noted with no bruising or swelling noted at site.  Needle removed intact.  Band aid applied.  VSS with discharge and left in satisfactory condition via wheelchair with no s/s of distress noted.

## 2023-06-06 ENCOUNTER — Ambulatory Visit (HOSPITAL_BASED_OUTPATIENT_CLINIC_OR_DEPARTMENT_OTHER)

## 2023-06-07 ENCOUNTER — Ambulatory Visit (HOSPITAL_COMMUNITY)
Admission: RE | Admit: 2023-06-07 | Discharge: 2023-06-07 | Disposition: A | Discharge status: Home / Self Care | Source: Ambulatory Visit | Attending: Hematology | Admitting: Hematology

## 2023-06-07 DIAGNOSIS — C189 Malignant neoplasm of colon, unspecified: Secondary | ICD-10-CM | POA: Diagnosis present

## 2023-06-07 DIAGNOSIS — C7989 Secondary malignant neoplasm of other specified sites: Secondary | ICD-10-CM | POA: Insufficient documentation

## 2023-06-07 MED ORDER — IOHEXOL 9 MG/ML PO SOLN
500.0000 mL | ORAL | Status: AC
  Administered 2023-06-07: 500 mL via ORAL

## 2023-06-07 MED ORDER — IOHEXOL 300 MG/ML  SOLN
100.0000 mL | Freq: Once | INTRAMUSCULAR | Status: AC | PRN
  Administered 2023-06-07: 100 mL via INTRAVENOUS
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
## 2023-06-14 ENCOUNTER — Other Ambulatory Visit (HOSPITAL_COMMUNITY): Payer: Self-pay

## 2023-06-14 ENCOUNTER — Other Ambulatory Visit: Payer: Self-pay

## 2023-06-14 ENCOUNTER — Inpatient Hospital Stay (HOSPITAL_BASED_OUTPATIENT_CLINIC_OR_DEPARTMENT_OTHER): Admitting: Hematology

## 2023-06-14 ENCOUNTER — Other Ambulatory Visit: Payer: Self-pay | Admitting: *Deleted

## 2023-06-14 ENCOUNTER — Encounter (HOSPITAL_COMMUNITY): Payer: Self-pay | Admitting: Hematology

## 2023-06-14 ENCOUNTER — Telehealth: Payer: Self-pay | Admitting: Pharmacy Technician

## 2023-06-14 ENCOUNTER — Inpatient Hospital Stay: Attending: Hematology

## 2023-06-14 ENCOUNTER — Inpatient Hospital Stay

## 2023-06-14 ENCOUNTER — Encounter: Payer: Self-pay | Admitting: Hematology

## 2023-06-14 ENCOUNTER — Telehealth: Payer: Self-pay | Admitting: Pharmacist

## 2023-06-14 ENCOUNTER — Other Ambulatory Visit: Payer: Self-pay | Admitting: Pharmacy Technician

## 2023-06-14 VITALS — BP 114/79 | HR 92 | Temp 98.7°F | Resp 18

## 2023-06-14 VITALS — BP 141/80 | HR 86 | Temp 99.2°F | Resp 20 | Wt 175.5 lb

## 2023-06-14 DIAGNOSIS — D649 Anemia, unspecified: Secondary | ICD-10-CM | POA: Insufficient documentation

## 2023-06-14 DIAGNOSIS — Z809 Family history of malignant neoplasm, unspecified: Secondary | ICD-10-CM | POA: Insufficient documentation

## 2023-06-14 DIAGNOSIS — Z793 Long term (current) use of hormonal contraceptives: Secondary | ICD-10-CM | POA: Diagnosis not present

## 2023-06-14 DIAGNOSIS — M549 Dorsalgia, unspecified: Secondary | ICD-10-CM | POA: Insufficient documentation

## 2023-06-14 DIAGNOSIS — Z95828 Presence of other vascular implants and grafts: Secondary | ICD-10-CM

## 2023-06-14 DIAGNOSIS — C189 Malignant neoplasm of colon, unspecified: Secondary | ICD-10-CM | POA: Diagnosis not present

## 2023-06-14 DIAGNOSIS — Z9221 Personal history of antineoplastic chemotherapy: Secondary | ICD-10-CM | POA: Diagnosis not present

## 2023-06-14 DIAGNOSIS — I119 Hypertensive heart disease without heart failure: Secondary | ICD-10-CM | POA: Diagnosis not present

## 2023-06-14 DIAGNOSIS — E876 Hypokalemia: Secondary | ICD-10-CM | POA: Insufficient documentation

## 2023-06-14 DIAGNOSIS — C187 Malignant neoplasm of sigmoid colon: Secondary | ICD-10-CM | POA: Insufficient documentation

## 2023-06-14 DIAGNOSIS — R599 Enlarged lymph nodes, unspecified: Secondary | ICD-10-CM | POA: Insufficient documentation

## 2023-06-14 DIAGNOSIS — Z5112 Encounter for antineoplastic immunotherapy: Secondary | ICD-10-CM | POA: Diagnosis present

## 2023-06-14 DIAGNOSIS — G629 Polyneuropathy, unspecified: Secondary | ICD-10-CM | POA: Insufficient documentation

## 2023-06-14 LAB — CBC WITH DIFFERENTIAL/PLATELET
Abs Immature Granulocytes: 0.01 10*3/uL (ref 0.00–0.07)
Basophils Absolute: 0 10*3/uL (ref 0.0–0.1)
Basophils Relative: 1 %
Eosinophils Absolute: 0 10*3/uL (ref 0.0–0.5)
Eosinophils Relative: 1 %
HCT: 32.1 % — ABNORMAL LOW (ref 36.0–46.0)
Hemoglobin: 11.2 g/dL — ABNORMAL LOW (ref 12.0–15.0)
Immature Granulocytes: 0 %
Lymphocytes Relative: 47 %
Lymphs Abs: 1.7 10*3/uL (ref 0.7–4.0)
MCH: 33 pg (ref 26.0–34.0)
MCHC: 34.9 g/dL (ref 30.0–36.0)
MCV: 94.7 fL (ref 80.0–100.0)
Monocytes Absolute: 0.7 10*3/uL (ref 0.1–1.0)
Monocytes Relative: 18 %
Neutro Abs: 1.2 10*3/uL — ABNORMAL LOW (ref 1.7–7.7)
Neutrophils Relative %: 33 %
Platelets: 158 10*3/uL (ref 150–400)
RBC: 3.39 MIL/uL — ABNORMAL LOW (ref 3.87–5.11)
RDW: 16.4 % — ABNORMAL HIGH (ref 11.5–15.5)
WBC: 3.6 10*3/uL — ABNORMAL LOW (ref 4.0–10.5)
nRBC: 0 % (ref 0.0–0.2)

## 2023-06-14 LAB — URINALYSIS, DIPSTICK ONLY
Bilirubin Urine: NEGATIVE
Glucose, UA: NEGATIVE mg/dL
Ketones, ur: NEGATIVE mg/dL
Nitrite: NEGATIVE
Protein, ur: 30 mg/dL — AB
Specific Gravity, Urine: 1.016 (ref 1.005–1.030)
pH: 5 (ref 5.0–8.0)

## 2023-06-14 LAB — COMPREHENSIVE METABOLIC PANEL WITH GFR
ALT: 14 U/L (ref 0–44)
AST: 20 U/L (ref 15–41)
Albumin: 3.3 g/dL — ABNORMAL LOW (ref 3.5–5.0)
Alkaline Phosphatase: 66 U/L (ref 38–126)
Anion gap: 9 (ref 5–15)
BUN: 7 mg/dL (ref 6–20)
CO2: 18 mmol/L — ABNORMAL LOW (ref 22–32)
Calcium: 8.6 mg/dL — ABNORMAL LOW (ref 8.9–10.3)
Chloride: 107 mmol/L (ref 98–111)
Creatinine, Ser: 0.7 mg/dL (ref 0.44–1.00)
GFR, Estimated: >60 mL/min (ref >60)
Glucose, Bld: 100 mg/dL — ABNORMAL HIGH (ref 70–99)
Potassium: 3.3 mmol/L — ABNORMAL LOW (ref 3.5–5.1)
Sodium: 134 mmol/L — ABNORMAL LOW (ref 135–145)
Total Bilirubin: 0.6 mg/dL (ref 0.0–1.2)
Total Protein: 6.7 g/dL (ref 6.5–8.1)

## 2023-06-14 LAB — MAGNESIUM: Magnesium: 1.7 mg/dL (ref 1.7–2.4)

## 2023-06-14 LAB — PREGNANCY, URINE: Preg Test, Ur: NEGATIVE

## 2023-06-14 MED ORDER — PROCHLORPERAZINE MALEATE 10 MG PO TABS: 10.0000 mg | ORAL_TABLET | Freq: Four times a day (QID) | ORAL | 2 refills | Status: AC | PRN

## 2023-06-14 MED ORDER — SODIUM CHLORIDE 0.9 % IV SOLN: INTRAVENOUS | Status: DC

## 2023-06-14 MED ORDER — LONSURF 20-8.19 MG PO TABS
35.0000 mg/m2 | ORAL_TABLET | Freq: Two times a day (BID) | ORAL | 2 refills | Status: DC
  Filled 2023-06-14: qty 60, 28d supply, fill #0
  Filled 2023-07-10: qty 60, 28d supply, fill #1
  Filled 2023-08-08: qty 60, 28d supply, fill #2

## 2023-06-14 MED ORDER — SODIUM CHLORIDE 0.9% FLUSH
10.0000 mL | INTRAVENOUS | Status: DC | PRN
  Administered 2023-06-14: 10 mL via INTRAVENOUS

## 2023-06-14 MED ORDER — BEVACIZUMAB-ADCD CHEMO INJECTION 400 MG/16ML
5.0000 mg/kg | Freq: Once | INTRAVENOUS | Status: AC
  Administered 2023-06-14: 400 mg via INTRAVENOUS
  Filled 2023-06-14: qty 16

## 2023-06-14 MED ORDER — HEPARIN SOD (PORK) LOCK FLUSH 100 UNIT/ML IV SOLN
500.0000 [IU] | Freq: Once | INTRAVENOUS | Status: AC | PRN
  Administered 2023-06-14: 500 [IU]

## 2023-06-14 NOTE — Patient Instructions (Addendum)
 [facility] at [facility] Discharge Instructions   You were seen and examined today by Dr. Cheree Cords.  He reviewed the results of your lab work which are normal/stable.   He reviewed the results of your CT scan. 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 We will need to change your treatment. There is no spread of the cancer to other areas of your body.   We will do a special blood test today called Guardant 360. This will help us  determine if there are any mutations in the cancer to target.   We will put you on a pill called Lonsurf. This will come a specialty pharmacy and be delivered to your home. Take as directed.   Return as scheduled.    Thank you for choosing [facility] at [facility] to provide your oncology and hematology care.  To afford each patient quality time with our provider, please arrive at least 15 minutes before your scheduled appointment time.   If you have a lab appointment with the Cancer Center please come in thru the Main Entrance and check in at the main information desk.  You need to re-schedule your appointment should you arrive 10 or more minutes late.  We strive to give you quality time with our providers, and arriving late affects you and other patients whose appointments are after yours.  Also, if you no show three or more times for appointments you may be dismissed from the clinic at the providers discretion.     Again, thank you for choosing [facility].  Our hope is that these requests will decrease the amount of time that you wait before being seen by our physicians.       _____________________________________________________________  Should you have questions after your visit to [facility], please contact our office at [phone] and follow the prompts.  Our office hours are 8:00 a.m. and 4:30 p.m. Monday - Friday.   Please note that voicemails left after 4:00 p.m. may not be returned until the following business day.  We are closed weekends and major holidays.  You do have access to a nurse 24-7, just call the main number to the clinic [phone] and do not press any options, hold on the line and a nurse will answer the phone.    For prescription refill requests, have your pharmacy contact our office and allow 72 hours.    Due to Covid, you will need to wear a mask upon entering the hospital. If you do not have a mask, a mask will be given to you at the Main Entrance upon arrival. For doctor visits, patients may have 1 support person age 28 or older with them. For treatment visits, patients can not have anyone with them due to social distancing guidelines and our immunocompromised population.

## 2023-06-14 NOTE — Telephone Encounter (Signed)
 Patient successfully OnBoarded and drug education provided by pharmacist. Medication scheduled to be shipped on 06/15/2023 for delivery on 06/16/2023 from [facility] to patient's address. Patient also knows to call me at [phone] with any questions or concerns regarding receiving medication or if there is any unexpected change in co-pay.   Patty Benjaman Branch, CPhT Oncology Pharmacy Patient Advocate [facility] Center [facility] Direct Number: [phone] Fax: [phone]

## 2023-06-14 NOTE — Progress Notes (Signed)
 Patients port flushed without difficulty.  Good blood return noted with no bruising or swelling noted at site.  Patient remains accessed for treatment.

## 2023-06-14 NOTE — Patient Instructions (Signed)
 [facility] [place] - A DEPT OF [facility]  Discharge Instructions: Thank you for choosing [facility] to provide your oncology and hematology care.  If you have a lab appointment with the Cancer Center - please note that after April 8th, 2024, all labs will be drawn in the cancer center.  You do not have to check in or register with the main entrance as you have in the past but will complete your check-in in the cancer center.  Wear comfortable clothing and clothing appropriate for easy access to any Portacath or PICC line.   We strive to give you quality time with your provider. You may need to reschedule your appointment if you arrive late (15 or more minutes).  Arriving late affects you and other patients whose appointments are after yours.  Also, if you miss three or more appointments without notifying the office, you may be dismissed from the clinic at the provider's discretion.      For prescription refill requests, have your pharmacy contact our office and allow 72 hours for refills to be completed.    Today you received the following chemotherapy and/or immunotherapy agents Vegzelma    To help prevent nausea and vomiting after your treatment, we encourage you to take your nausea medication as directed.  BELOW ARE SYMPTOMS THAT SHOULD BE REPORTED IMMEDIATELY: *FEVER GREATER THAN 100.4 F (38 C) OR HIGHER *CHILLS OR SWEATING *NAUSEA AND VOMITING THAT IS NOT CONTROLLED WITH YOUR NAUSEA MEDICATION *UNUSUAL SHORTNESS OF BREATH *UNUSUAL BRUISING OR BLEEDING *URINARY PROBLEMS (pain or burning when urinating, or frequent urination) *BOWEL PROBLEMS (unusual diarrhea, constipation, pain near the anus) TENDERNESS IN MOUTH AND THROAT WITH OR WITHOUT PRESENCE OF ULCERS (sore throat, sores in mouth, or a toothache) UNUSUAL RASH, SWELLING OR PAIN  UNUSUAL VAGINAL DISCHARGE OR ITCHING   Items with * indicate a potential emergency and should be followed up as  soon as possible or go to the Emergency Department if any problems should occur.  Please show the CHEMOTHERAPY ALERT CARD or IMMUNOTHERAPY ALERT CARD at check-in to the Emergency Department and triage nurse.  Should you have questions after your visit or need to cancel or reschedule your appointment, please contact [facility] [place] - A DEPT OF [facility] [phone]  and follow the prompts.  Office hours are 8:00 a.m. to 4:30 p.m. Monday - Friday. Please note that voicemails left after 4:00 p.m. may not be returned until the following business day.  We are closed weekends and major holidays. You have access to a nurse at all times for urgent questions. Please call the main number to the clinic [phone] and follow the prompts.  For any non-urgent questions, you may also contact your provider using MyChart. We now offer e-Visits for anyone 51 and older to request care online for non-urgent symptoms. For details visit [email or web address].   Also download the MyChart app! Go to the app store, search "MyChart", open the app, select [facility], and log in with your MyChart username and password.

## 2023-06-14 NOTE — Telephone Encounter (Signed)
 Oral Oncology Patient Advocate Encounter  After completing a benefits investigation, prior authorization for Lonsurf is not required at this time through [facility].  Patient's copay is $4.     Thresa Floor, CPhT Oncology Pharmacy Patient Advocate [facility] Center [facility] Direct Number: [phone] Fax: [phone]

## 2023-06-14 NOTE — Telephone Encounter (Signed)
 Opened in error.  Patty Benjaman Branch, CPhT Oncology Pharmacy Patient Advocate [facility] Direct Number: [phone] Fax: [phone]

## 2023-06-14 NOTE — Progress Notes (Signed)
 Per MD pt only receiving Vegzelma  today. Pt aware and agrees to plan. Patient tolerated chemotherapy with no complaints voiced.  Side effects with management reviewed with understanding verbalized.  Port site clean and dry with no bruising or swelling noted at site.  Good blood return noted before and after administration of chemotherapy.  Band aid applied.  Patient left in satisfactory condition with VSS and no s/s of distress noted. All follow ups as scheduled.   Diana Fox [employer]

## 2023-06-14 NOTE — Progress Notes (Signed)
 Specialty Pharmacy Initial Fill Coordination Note  Diana Fox is a 53 y.o. female contacted today regarding refills of specialty medication(s) Trifluridine-Tipiracil (Lonsurf) .  Patient requested Delivery  on 06/16/23  to verified address [address] [place] [address]   Medication will be filled on 06/15/2023.   Patient is aware of $4 copayment. Pt requests to bill a/r.  Patty Benjaman Branch, CPhT Oncology Pharmacy Patient Advocate [facility] Direct Number: [phone] Fax: [phone]

## 2023-06-14 NOTE — Progress Notes (Signed)
 DISCONTINUE ON PATHWAY REGIMEN - Colorectal     A cycle is every 14 days:     Bevacizumab -xxxx      Oxaliplatin       Leucovorin       Fluorouracil       Fluorouracil    **Always confirm dose/schedule in your pharmacy ordering system**  PRIOR TREATMENT: HYQMV78: [id number] + Bevacizumab  q14 Days  START ON PATHWAY REGIMEN - Colorectal     A cycle is every 28 days:     Trifluridine and tipiracil      Bevacizumab -xxxx   **Always confirm dose/schedule in your pharmacy ordering system**  Patient Characteristics: Distant Metastases, Nonsurgical Candidate, KRAS/NRAS Wild-Type (BRAF V600 Wild-Type/Unknown), Standard Cytotoxic Therapy, Third Line Standard Cytotoxic Therapy, Prior Anti-EGFR Therapy Tumor Location: Colon Therapeutic Status: 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

## 2023-06-14 NOTE — Telephone Encounter (Signed)
 Clinical Pharmacist Practitioner Encounter   Received new prescription for Lonsurf (trifluridine/tipiracil) for the treatment of stage IV colon cancer in conjunction with bevacizumab , planned duration until disease progression or unacceptable drug toxicity.  CMP from 06/14/23 assessed, no relevant lab abnormalities. Prescription dose and frequency assessed.   Current medication list in Epic reviewed, no DDIs with Lonsurf identified:  Evaluated chart and no patient barriers to medication adherence identified.   Prescription has been e-scribed to the [facility] for benefits analysis and approval.  Oral Oncology Clinic will continue to follow for insurance authorization, copayment issues, initial counseling and start date.   Jetta Murray N. Jaceion Aday, PharmD, BCOP, CPP Hematology/Oncology Clinical Pharmacist [facility] Oral Chemotherapy Navigation Clinic [phone]  06/14/2023 11:07 AM

## 2023-06-14 NOTE — Telephone Encounter (Signed)
 Clinical Pharmacist Practitioner Encounter   Patient will start her Lonsurf on 06/19/23.   Patient Education I spoke with patient for overview of new oral chemotherapy medication: Lonsurf (trifluridine/tipiracil) for the treatment of stage IV colon cancer in conjunction with bevacizumab , planned duration until disease progression or unacceptable drug toxicity. Planned start 06/19/23.   Counseled patient on administration, dosing, side effects, monitoring, drug-food interactions, safe handling, storage, and disposal. Patient will take 3 tablets (60 mg of trifluridine total) by mouth 2 (two) times daily after a meal. Take within 1 hr after AM & PM meals on days 1-5, 8-12. Repeat every 28 days.   Side effects include but not limited to: diarrhea, nausea, fatigue, decreased wbc/hgb/plt.   Diarrhea: patient knows to use loperamide as needed and call the office if they are having four or more loose stool per day  Reviewed with patient importance of keeping a medication schedule and plan for any missed doses.  After discussion with patient no patient barriers to medication adherence identified.   Ms. Matteucci voiced understanding and appreciation. All questions answered. Medication handout provided.  Provided patient with Oral Chemotherapy Navigation Clinic phone number. Patient knows to call the office with questions or concerns. Oral Chemotherapy Navigation Clinic will continue to follow.  Doyce Stonehouse N. Roxy Mastandrea, PharmD, BCOP, CPP Hematology/Oncology Clinical Pharmacist [facility] Oral Chemotherapy Navigation Clinic [phone]  06/14/2023 3:33 PM

## 2023-06-14 NOTE — Progress Notes (Signed)
 Pharmacist Chemotherapy Monitoring - Initial Assessment    Anticipated start date: 06/14/23   The following has been reviewed per standard work regarding the patient's treatment regimen: The patient's diagnosis, treatment plan and drug doses, and organ/hematologic function Lab orders and baseline tests specific to treatment regimen  The treatment plan start date, drug sequencing, and pre-medications Prior authorization status  Patient's documented medication list, including drug-drug interaction screen and prescriptions for anti-emetics and supportive care specific to the treatment regimen The drug concentrations, fluid compatibility, administration routes, and timing of the medications to be used The patient's access for treatment and lifetime cumulative dose history, if applicable  The patient's medication allergies and previous infusion related reactions, if applicable   Changes made to treatment plan:  N/A  Follow up needed:  N/A  New start per MD OV note.  Lonsurf to arrive at patients home from specialty pharmacy.  Urine pregnancy as ordered - negative today. Urine protein - [address]    Diana Fox, [facility], 06/14/2023  11:26 AM

## 2023-06-14 NOTE — Progress Notes (Signed)
 Patient education documented in EPIC note on 06/14/23.

## 2023-06-15 ENCOUNTER — Other Ambulatory Visit: Payer: Self-pay

## 2023-06-15 LAB — CEA: CEA: 139 ng/mL — ABNORMAL HIGH (ref 0.0–4.7)

## 2023-06-16 ENCOUNTER — Inpatient Hospital Stay

## 2023-06-16 ENCOUNTER — Other Ambulatory Visit: Payer: Self-pay

## 2023-06-28 ENCOUNTER — Inpatient Hospital Stay: Admitting: Hematology

## 2023-06-28 ENCOUNTER — Inpatient Hospital Stay

## 2023-06-29 ENCOUNTER — Other Ambulatory Visit (HOSPITAL_COMMUNITY): Payer: Self-pay

## 2023-06-30 ENCOUNTER — Inpatient Hospital Stay

## 2023-07-05 ENCOUNTER — Other Ambulatory Visit: Payer: Self-pay

## 2023-07-06 ENCOUNTER — Other Ambulatory Visit: Payer: Self-pay

## 2023-07-06 ENCOUNTER — Other Ambulatory Visit: Payer: Self-pay | Admitting: Hematology

## 2023-07-06 DIAGNOSIS — E876 Hypokalemia: Secondary | ICD-10-CM

## 2023-07-10 ENCOUNTER — Other Ambulatory Visit: Payer: Self-pay

## 2023-07-10 NOTE — Progress Notes (Signed)
 Clinical Intervention Note  Clinical Intervention Notes: Patient states she has 3 tablets left from previous cycle but that she did not miss any doses. Reminded her that she was dispensed the exact amount of tablets needed for her cycle length. Reinforced the appropriate dosing cycle and advised that she should not have any tablets remaining when her active cycle days are complete. She will keep track of this better this month.   Clinical Intervention Outcomes: Improved therapy adherence   Rena Carnes Specialty Pharmacist

## 2023-07-10 NOTE — Progress Notes (Signed)
 Specialty Pharmacy Ongoing Clinical Assessment Note  Diana Fox is a 53 y.o. female who is being followed by the specialty pharmacy service for RxSp Oncology   Patient's specialty medication(s) reviewed today: Trifluridine -Tipiracil  (Lonsurf )   Missed doses in the last 4 weeks: 0   Patient/Caregiver did not have any additional questions or concerns.   Therapeutic benefit summary: Unable to assess   Adverse events/side effects summary: No adverse events/side effects   Patient's therapy is appropriate to: Continue    Goals Addressed             This Visit's Progress    Slow Disease Progression       Patient is unable to be assessed as therapy was recently initiated. Patient will maintain adherence         Follow up: 3 months  Koral Thaden M [name] Specialty Pharmacist

## 2023-07-10 NOTE — Progress Notes (Signed)
 Specialty Pharmacy Refill Coordination Note  Diana Fox is a 53 y.o. female contacted today regarding refills of specialty medication(s) Trifluridine -Tipiracil  (Lonsurf )   Patient requested Delivery   Delivery date: 07/13/23   Verified address: [address]   [place] [address]   Medication will be filled on 07/12/23.

## 2023-07-11 NOTE — Progress Notes (Signed)
 [facility] [address], [place] [address]    Clinic Day:  07/12/2023  Referring physician: Leesa Pulling, MD  Patient Care Team: Leesa Pulling, MD as PCP - General (Family Medicine) Paulett Boros, MD as Medical Oncologist (Medical Oncology) Gerhard Knuckles, RN as Oncology Nurse Navigator (Medical Oncology)   ASSESSMENT & PLAN:   Assessment: 1. 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  Biopsy consistent with moderately to poorly differentiated adenocarcinoma. - CTAP with contrast on 01/10/2021: Diffusely thickened, 10 cm segment of distal sigmoid colon with adjacent moderate inflammatory reaction, suspected 2 sites of small bowel fistulization to the diseased segment and a rim-enhancing low-density 2.5 x 1.8 cm stricture medial to the mid segment which could represent a necrotic lymph node or contained perforation/abscess.  There are bilateral mildly enlarged common iliac chain nodes.  Mildly dilated mid to lower abdominal small bowel segments.  Mildly prominent slightly steatotic liver. - Sigmoid colon resection, partial small bowel resection and Hartman's procedure on 03/26/2021: Moderately to poorly differentiated adenocarcinoma involving the subserosal adipose tissue and present at the margin.  9/9 lymph nodes positive for metastatic carcinoma.  Small bowel resection is positive for adenocarcinoma nodules in the serosal fat.  PT4PN2B.  MMR is preserved. - Operative report mentions obvious extension of the tumor outside the colon to the pelvic brim, unable to fully excise all of the tumor in the pelvis. - NGS testing (04/26/2021): KRAS/NRAS negative, BRAF negative, HER2 IHC (0) negative, MSI-stable - PET scan on 04/29/2021: Multifocal hypermetabolic  nodule, peritoneal and anterior abdominal wall adenopathy.  Findings worrisome for peritoneal carcinomatosis.  No evidence of hepatic metastatic disease or distant mets. - FOLFOXIRI and Vectibix  started on 05/17/2021, oxaliplatin  discontinued in October 2023.  FOLFIRI and Vectibix  continued until 12/20/2022 with progression. - We will see how Diana Fox responds and will refer to Dr. Almira Armour for resection/HIPEC therapy.  Colonoscopy through stoma and rectal stump on 10/04/2021 was negative. - FOLFOX and bevacizumab  started on 01/18/2023 and discontinued after 05/31/2023 for progression. - Lonsurf  and bevacizumab  started  cycle 1 on 06/19/2023 - Guardant360 (06/16/2023): GNAS, T p53, APC, MSI high not detected, TMB-low.   2. Social/family history: - Diana Fox lives at home with Diana Fox family.  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  Non-smoker. - Paternal cousin had cancer.  Maternal aunt had cancer.    Plan: 1. Stage IV sigmoid colon adenocarcinoma, MSI-stable, RAS/BRAF negative: - CT CAP (06/06/2020): Enlargement of complex mixed cystic solid mass measuring 15.7 x 11.4 cm.  Mass has been present since August 2024, consistent with metastatic disease.  Soft tissue mass/lymph node in the sigmoid mesentery along the left common iliac artery measures 2.4 x 2.1 cm, previously 2.3 x 1.9 cm. - We discussed Guardant360 results which did not show any targetable mutations. - Diana Fox started cycle 1 on 06/19/2023.  Did not experience any GI side effects. - Diana Fox has missed bevacizumab  around 06/28/2023. - Reviewed labs today: Normal LFTs and creatinine.  CBC grossly normal. - Diana Fox will start cycle 2 on 07/17/2023.  Diana Fox will receive bevacizumab  on same day.  Diana Fox will continue bevacizumab  every 2 weeks.  I will see Diana Fox back on 08/14/2023 prior to start of cycle 3.  At that time we will arrange for scans.   2.  Hypokalemia: - 3.1 today.  Diana Fox will take potassium 40 mEq twice daily.  3.  Peripheral neuropathy: - Numbness in the feet left  more than right is stable.  4.  Hypertension: - Continue Norvasc  10 mg daily and Bystolic  10 mg daily.  Blood pressure is 134/73.    No orders of the defined types were placed in this encounter.     Nadeen Augusta Teague,acting as a Neurosurgeon for Paulett Boros, MD.,[name], MD,as directed by  Paulett Boros, MD while in the presence of Paulett Boros, MD.  I, Paulett Boros MD, have reviewed the above documentation for accuracy and completeness, and I agree with the above.     Paulett Boros, MD   6/4/202512:39 PM  CHIEF COMPLAINT:   Diagnosis: stage IV sigmoid colon adenocarcinoma    Cancer Staging  Colon carcinoma [facility]) Staging form: Colon and Rectum, AJCC 8th Edition - Clinical stage from 04/06/2021: Stage IVC (cT4a, cN2b, pM1c) - Signed by Paulett Boros, MD on 05/04/2021    Prior Therapy: 1. FOLFOXIRI and Vectibix , 05/17/21 - 11/2021 2. FOLFIRI and Vectibix , 11/2021 - 12/20/22  Current Therapy:  FOLFOX and bevacizumab     HISTORY OF PRESENT ILLNESS:   Oncology History  Colon carcinoma (HCC)  03/26/2021 Initial Diagnosis   Colon carcinoma (HCC)   04/06/2021 Cancer Staging   Staging form: Colon and Rectum, AJCC 8th Edition - Clinical stage from 04/06/2021: Stage IVC (cT4a, cN2b, pM1c) - Signed by Paulett Boros, MD on 05/04/2021 Histopathologic type: Adenocarcinoma, NOS Stage prefix: Initial diagnosis Total positive nodes: 9 Total nodes examined: 9 Tumor deposits (TD): Present Carcinoembryonic antigen (CEA) (ng/mL): 61.5 Perineural invasion (PNI): Present Microsatellite instability (MSI): Stable KRAS mutation: Negative NRAS mutation: Negative BRAF mutation: Negative   05/17/2021 - 10/08/2021 Chemotherapy   Patient is on Treatment Plan : COLORECTAL FOLFOXIRI + Panitumumab  q14d     05/17/2021 - 12/22/2022 Chemotherapy   Patient is on Treatment Plan : COLORECTAL  FOLFIRI + VECTIBIX  q14d     01/18/2023 - 06/02/2023 Chemotherapy   Patient is on Treatment Plan : COLORECTAL FOLFOX + Bevacizumab  q14d     06/14/2023 -  Chemotherapy   Patient is on Treatment Plan : COLORECTAL Bevacizumab  + Trifluridine /Tipiracil  q28d        INTERVAL HISTORY:   Diana Fox is a 53 y.o. female presenting to clinic today for follow up of stage IV sigmoid colon adenocarcinoma. Diana Fox was last seen by me on 06/14/23.  Today, Diana Fox states that Diana Fox is doing well overall. Diana Fox appetite level is at 100%. Diana Fox energy level is at 100%.  PAST MEDICAL HISTORY:   Past Medical History: Past Medical History:  Diagnosis Date   Anemia    Back pain    Hypertension    Port-A-Cath in place 05/10/2021    Surgical History: Past Surgical History:  Procedure Laterality Date   BIOPSY  03/02/2021   Procedure: BIOPSY;  Surgeon: Vinetta Greening, DO;  [facility];  Service: Endoscopy;;   BOWEL RESECTION N/A 03/26/2021   Procedure: SMALL BOWEL RESECTION;  Surgeon: Alanda Allegra, MD;  Location: [facility] ORS;  Service: General;  Laterality: N/A;   CHOLECYSTECTOMY     COLONOSCOPY WITH PROPOFOL  N/A 10/04/2021   Procedure: COLONOSCOPY WITH PROPOFOL ;  Surgeon: Vinetta Greening, DO;  Location: [facility];  Service: Endoscopy;  Laterality: N/A;  12:15PM, VIA OSTOMY   COLOSTOMY N/A 03/26/2021   Procedure: COLOSTOMY;  Surgeon: Alanda Allegra, MD;  Location: [facility]  ORS;  Service: General;  Laterality: N/A;   ESOPHAGOGASTRODUODENOSCOPY (EGD) WITH PROPOFOL  N/A 03/02/2021   Procedure: ESOPHAGOGASTRODUODENOSCOPY (EGD) WITH PROPOFOL ;  Surgeon: Vinetta Greening, DO;  Location: [facility];  Service: Endoscopy;  Laterality: N/A;   PARTIAL COLECTOMY N/A 03/26/2021   Procedure: PARTIAL COLECTOMY;  Surgeon: Alanda Allegra, MD;  Location: [facility] ORS;  Service: General;  Laterality: N/A;   PORTACATH PLACEMENT Left 04/14/2021   Procedure: INSERTION PORT-A-CATH;  Surgeon: Alanda Allegra, MD;  Location: [facility] ORS;  Service: General;   Laterality: Left;   SCLEROTHERAPY  03/02/2021   Procedure: Daryle Eon;  Surgeon: Vinetta Greening, DO;  Location: [facility];  Service: Endoscopy;;   SIGMOIDOSCOPY  03/02/2021   Procedure: Angele Keller;  Surgeon: Vinetta Greening, DO;  [facility]: [facility];  Service: Endoscopy;;    Social History: Social History   Socioeconomic History   Marital status: Married    Spouse name: Not on file   Number of children: Not on file   Years of education: Not on file   Highest education level: Not on file  Occupational History   Not on file  Tobacco Use   Smoking status: Never   Smokeless tobacco: Never  Vaping Use   Vaping status: Never Used  Substance and Sexual Activity   Alcohol use: No   Drug use: No   Sexual activity: Not on file  Other Topics Concern   Not on file  Social History Narrative   Not on file   Social Drivers of Health   Financial Resource Strain: Medium Risk (06/20/2023)   Received from [facility]   Overall Financial Resource Strain (CARDIA)    Difficulty of Paying Living Expenses: Somewhat hard  Food Insecurity: Food Insecurity Present (06/20/2023)   Received from [facility]   Hunger Vital Sign    Worried About Running Out of Food in the Last Year: Sometimes true    Ran Out of Food in the Last Year: Sometimes true  Transportation Needs: No Transportation Needs (06/20/2023)   Received from [facility]   PRAPARE - Transportation    Lack of Transportation (Medical): No    Lack of Transportation (Non-Medical): No  Physical Activity: Inactive (06/20/2023)   Received from [facility]   Exercise Vital Sign    Days of Exercise per Week: 0 days    Minutes of Exercise per Session: 0 min  Stress: No Stress Concern Present (06/20/2023)   Received from [facility] of Occupational [facility] - Occupational Stress Questionnaire    Feeling of Stress : Not at all  Social Connections: Moderately Isolated (06/20/2023)   Received  from [facility]   Social Connection and Isolation Panel [NHANES]    Frequency of Communication with Friends and Family: More than three times a week    Frequency of Social Gatherings with Friends and Family: Once a week    Attends Religious Services: More than 4 times per year    Active Member of [employer] or Organizations: No    Attends Banker Meetings: Never    Marital Status: Divorced  Catering manager Violence: Not At Risk (06/20/2023)   Received from [facility]   Humiliation, Afraid, Rape, and Kick questionnaire    Fear of Current or Ex-Partner: No    Emotionally Abused: No    Physically Abused: No    Sexually Abused: No    Family History: Family History  Problem Relation Age of Onset  Hypertension Mother    Hypertension Father    Colon cancer Neg Hx    Colon polyps Neg Hx    Inflammatory bowel disease Neg Hx     Current Medications:  Current Outpatient Medications:    amLODipine  (NORVASC ) 10 MG tablet, TAKE 1 TABLET BY MOUTH EVERY DAY, Disp: 90 tablet, Rfl: 1   cetirizine (ZYRTEC) 10 MG tablet, SMARTSIG:1 Tablet(s) By Mouth Every Evening, Disp: , Rfl:    fexofenadine  (ALLEGRA ) 180 MG tablet, Take 1 tablet (180 mg total) by mouth daily., Disp: 30 tablet, Rfl: 0   fluorouracil  CALGB 19147 2,400 mg/m2 in sodium chloride  0.9 % 150 mL, Inject 2,400 mg/m2 into the vein over 48 hr. Every 14 days, Disp: , Rfl:    FLUOROURACIL  IV, Inject into the vein every 14 (fourteen) days., Disp: , Rfl:    fluticasone  (FLONASE ) 50 MCG/ACT nasal spray, Place 2 sprays into both nostrils daily., Disp: 16 g, Rfl: 12   magnesium  oxide (MAG-OX) 400 (240 Mg) MG tablet, Take 1 tablet (400 mg total) by mouth 2 (two) times daily., Disp: 60 tablet, Rfl: 6   medroxyPROGESTERone (DEPO-PROVERA) 150 MG/ML injection, Inject 150 mg into the muscle every 3 (three) months., Disp: , Rfl:    Multiple Vitamin (MULTIVITAMIN) tablet, Take 1 tablet by mouth daily., Disp: , Rfl:    nebivolol   (BYSTOLIC ) 10 MG tablet, Take 10 mg by mouth daily., Disp: , Rfl:    OXALIPLATIN  IV, Inject into the vein every 14 (fourteen) days., Disp: , Rfl:    Panitumumab  (VECTIBIX  IV), Inject into the vein every 14 (fourteen) days., Disp: , Rfl:    pantoprazole  (PROTONIX ) 40 MG tablet, Take 40 mg by mouth daily as needed (acid reflux)., Disp: , Rfl:    prochlorperazine  (COMPAZINE ) 10 MG tablet, Take 1 tablet (10 mg total) by mouth every 6 (six) hours as needed for nausea or vomiting., Disp: 60 tablet, Rfl: 2   trifluridine -tipiracil  (LONSURF ) 20-8.19 MG tablet, Take 3 tablets (60 mg of trifluridine  total) by mouth 2 (two) times daily after a meal. Take within 1 hr after AM & PM meals on days 1-5, 8-12. Repeat every 28 days., Disp: 60 tablet, Rfl: 2   potassium chloride  SA (KLOR-CON  M20) 20 MEQ tablet, Take 2 tablets (40 mEq total) by mouth 2 (two) times daily., Disp: 360 tablet, Rfl: 3   Allergies: Allergies  Allergen Reactions   Zithromax  [Azithromycin ] Hives    infusing arm began to swell , Diana Fox face got red, and Diana Fox broke out, there are bumps around where the IV site was infusing   Motrin [Ibuprofen] Itching   Sudafed [Pseudoephedrine  Hcl] Other (See Comments)    Hypertension   Zestril [Lisinopril] Cough    REVIEW OF SYSTEMS:   Review of Systems  Constitutional:  Negative for chills, fatigue and fever.  HENT:   Negative for lump/mass, mouth sores, nosebleeds, sore throat and trouble swallowing.   Eyes:  Negative for eye problems.  Respiratory:  Negative for cough and shortness of breath.   Cardiovascular:  Negative for chest pain, leg swelling and palpitations.  Gastrointestinal:  Negative for abdominal pain, constipation, diarrhea, nausea and vomiting.  Genitourinary:  Negative for bladder incontinence, difficulty urinating, dysuria, frequency, hematuria and nocturia.   Musculoskeletal:  Negative for arthralgias, back pain, flank pain, myalgias and neck pain.  Skin:  Negative for itching and  rash.  Neurological:  Positive for numbness. Negative for dizziness and headaches.  Hematological:  Does not bruise/bleed easily.  Psychiatric/Behavioral:  Negative  for depression, sleep disturbance and suicidal ideas. The patient is not nervous/anxious.   All other systems reviewed and are negative.    VITALS:   Blood pressure 134/73.  Wt Readings from Last 3 Encounters:  07/12/23 175 lb 0.7 oz (79.4 kg)  06/14/23 175 lb 7.8 oz (79.6 kg)  05/31/23 179 lb 14.3 oz (81.6 kg)    There is no height or weight on file to calculate BMI.  Performance status (ECOG): 1 - Symptomatic but completely ambulatory  PHYSICAL EXAM:   Physical Exam Vitals and nursing note reviewed. Exam conducted with a chaperone present.  Constitutional:      Appearance: Normal appearance.  Cardiovascular:     Rate and Rhythm: Normal rate and regular rhythm.     Pulses: Normal pulses.     Heart sounds: Normal heart sounds.  Pulmonary:     Effort: Pulmonary effort is normal.     Breath sounds: Normal breath sounds.  Abdominal:     Palpations: Abdomen is soft. There is no hepatomegaly, splenomegaly or mass.     Tenderness: There is no abdominal tenderness.  Musculoskeletal:     Right lower leg: No edema.     Left lower leg: No edema.  Lymphadenopathy:     Cervical: No cervical adenopathy.     Right cervical: No superficial, deep or posterior cervical adenopathy.    Left cervical: No superficial, deep or posterior cervical adenopathy.     Upper Body:     Right upper body: No supraclavicular or axillary adenopathy.     Left upper body: No supraclavicular or axillary adenopathy.  Neurological:     General: No focal deficit present.     Mental Status: Diana Fox is alert and oriented to person, place, and time.  Psychiatric:        Mood and Affect: Mood normal.        Behavior: Behavior normal.     LABS:   CBC     Component Value Date/Time   WBC 3.7 (L) 07/12/2023 1050   RBC 3.45 (L) 07/12/2023 1050    HGB 11.4 (L) 07/12/2023 1050   HCT 33.5 (L) 07/12/2023 1050   PLT 248 07/12/2023 1050   MCV 97.1 07/12/2023 1050   MCH 33.0 07/12/2023 1050   MCHC 34.0 07/12/2023 1050   RDW 15.0 07/12/2023 1050   LYMPHSABS 1.6 07/12/2023 1050   MONOABS 0.3 07/12/2023 1050   EOSABS 0.0 07/12/2023 1050   BASOSABS 0.0 07/12/2023 1050    CMP      Component Value Date/Time   NA 135 07/12/2023 1050   K 3.1 (L) 07/12/2023 1050   CL 108 07/12/2023 1050   CO2 20 (L) 07/12/2023 1050   GLUCOSE 112 (H) 07/12/2023 1050   BUN 7 07/12/2023 1050   CREATININE 0.64 07/12/2023 1050   CALCIUM  8.9 07/12/2023 1050   PROT 7.1 07/12/2023 1050   ALBUMIN 3.4 (L) 07/12/2023 1050   AST 19 07/12/2023 1050   ALT 11 07/12/2023 1050   ALKPHOS 59 07/12/2023 1050   BILITOT 0.6 07/12/2023 1050   GFRNONAA >60 07/12/2023 1050   GFRAA >60 10/26/2015 1317     Lab Results  Component Value Date   CEA1 139.0 (H) 06/14/2023   /  CEA  Date Value Ref Range Status  06/14/2023 139.0 (H) 0.0 - 4.7 ng/mL Final    Comment:    (NOTE)  Nonsmokers          <3.9                             Smokers             <5.6 Roche [employer] Electrochemiluminescence Immunoassay (ECLIA) Values obtained with different assay methods or kits cannot be used interchangeably.  Results cannot be interpreted as absolute evidence of the presence or absence of malignant disease. Performed At: [facility] [address] [place], [place] [id number] Pearlean Botts MD NW:[id number]    No results found for: "PSA1" No results found for: "CAN199" No results found for: "CAN125"  No results found for: "TOTALPROTELP", "ALBUMINELP", "A1GS", "A2GS", "BETS", "BETA2SER", "GAMS", "MSPIKE", "SPEI" Lab Results  Component Value Date   TIBC 256 01/11/2021   FERRITIN 108 01/11/2021   IRONPCTSAT 4 (L) 01/11/2021   No results found for: "LDH"   STUDIES:   No results found.

## 2023-07-12 ENCOUNTER — Inpatient Hospital Stay: Attending: Hematology

## 2023-07-12 ENCOUNTER — Inpatient Hospital Stay

## 2023-07-12 ENCOUNTER — Inpatient Hospital Stay (HOSPITAL_BASED_OUTPATIENT_CLINIC_OR_DEPARTMENT_OTHER): Admitting: Hematology

## 2023-07-12 ENCOUNTER — Other Ambulatory Visit: Payer: Self-pay

## 2023-07-12 DIAGNOSIS — Z9049 Acquired absence of other specified parts of digestive tract: Secondary | ICD-10-CM | POA: Diagnosis not present

## 2023-07-12 DIAGNOSIS — Z9221 Personal history of antineoplastic chemotherapy: Secondary | ICD-10-CM | POA: Diagnosis not present

## 2023-07-12 DIAGNOSIS — G629 Polyneuropathy, unspecified: Secondary | ICD-10-CM | POA: Insufficient documentation

## 2023-07-12 DIAGNOSIS — D649 Anemia, unspecified: Secondary | ICD-10-CM | POA: Insufficient documentation

## 2023-07-12 DIAGNOSIS — E876 Hypokalemia: Secondary | ICD-10-CM | POA: Diagnosis not present

## 2023-07-12 DIAGNOSIS — Z95828 Presence of other vascular implants and grafts: Secondary | ICD-10-CM

## 2023-07-12 DIAGNOSIS — Z5112 Encounter for antineoplastic immunotherapy: Secondary | ICD-10-CM | POA: Insufficient documentation

## 2023-07-12 DIAGNOSIS — C189 Malignant neoplasm of colon, unspecified: Secondary | ICD-10-CM

## 2023-07-12 DIAGNOSIS — Z793 Long term (current) use of hormonal contraceptives: Secondary | ICD-10-CM | POA: Insufficient documentation

## 2023-07-12 DIAGNOSIS — I10 Essential (primary) hypertension: Secondary | ICD-10-CM | POA: Diagnosis not present

## 2023-07-12 DIAGNOSIS — C187 Malignant neoplasm of sigmoid colon: Secondary | ICD-10-CM | POA: Insufficient documentation

## 2023-07-12 DIAGNOSIS — C786 Secondary malignant neoplasm of retroperitoneum and peritoneum: Secondary | ICD-10-CM | POA: Diagnosis not present

## 2023-07-12 DIAGNOSIS — Z79899 Other long term (current) drug therapy: Secondary | ICD-10-CM | POA: Diagnosis not present

## 2023-07-12 DIAGNOSIS — Z809 Family history of malignant neoplasm, unspecified: Secondary | ICD-10-CM | POA: Insufficient documentation

## 2023-07-12 LAB — CBC WITH DIFFERENTIAL/PLATELET
Abs Immature Granulocytes: 0.01 10*3/uL (ref 0.00–0.07)
Basophils Absolute: 0 10*3/uL (ref 0.0–0.1)
Basophils Relative: 0 %
Eosinophils Absolute: 0 10*3/uL (ref 0.0–0.5)
Eosinophils Relative: 1 %
HCT: 33.5 % — ABNORMAL LOW (ref 36.0–46.0)
Hemoglobin: 11.4 g/dL — ABNORMAL LOW (ref 12.0–15.0)
Immature Granulocytes: 0 %
Lymphocytes Relative: 44 %
Lymphs Abs: 1.6 10*3/uL (ref 0.7–4.0)
MCH: 33 pg (ref 26.0–34.0)
MCHC: 34 g/dL (ref 30.0–36.0)
MCV: 97.1 fL (ref 80.0–100.0)
Monocytes Absolute: 0.3 10*3/uL (ref 0.1–1.0)
Monocytes Relative: 9 %
Neutro Abs: 1.6 10*3/uL — ABNORMAL LOW (ref 1.7–7.7)
Neutrophils Relative %: 46 %
Platelets: 248 10*3/uL (ref 150–400)
RBC: 3.45 MIL/uL — ABNORMAL LOW (ref 3.87–5.11)
RDW: 15 % (ref 11.5–15.5)
WBC: 3.7 10*3/uL — ABNORMAL LOW (ref 4.0–10.5)
nRBC: 0 % (ref 0.0–0.2)

## 2023-07-12 LAB — COMPREHENSIVE METABOLIC PANEL WITH GFR
ALT: 11 U/L (ref 0–44)
AST: 19 U/L (ref 15–41)
Albumin: 3.4 g/dL — ABNORMAL LOW (ref 3.5–5.0)
Alkaline Phosphatase: 59 U/L (ref 38–126)
Anion gap: 7 (ref 5–15)
BUN: 7 mg/dL (ref 6–20)
CO2: 20 mmol/L — ABNORMAL LOW (ref 22–32)
Calcium: 8.9 mg/dL (ref 8.9–10.3)
Chloride: 108 mmol/L (ref 98–111)
Creatinine, Ser: 0.64 mg/dL (ref 0.44–1.00)
GFR, Estimated: >60 mL/min (ref >60)
Glucose, Bld: 112 mg/dL — ABNORMAL HIGH (ref 70–99)
Potassium: 3.1 mmol/L — ABNORMAL LOW (ref 3.5–5.1)
Sodium: 135 mmol/L (ref 135–145)
Total Bilirubin: 0.6 mg/dL (ref 0.0–1.2)
Total Protein: 7.1 g/dL (ref 6.5–8.1)

## 2023-07-12 MED ORDER — POTASSIUM CHLORIDE CRYS ER 20 MEQ PO TBCR: 40.0000 meq | EXTENDED_RELEASE_TABLET | Freq: Two times a day (BID) | ORAL | 3 refills | Status: AC

## 2023-07-12 MED ORDER — SODIUM CHLORIDE FLUSH 0.9 % IV SOLN
10.0000 mL | Freq: Once | INTRAVENOUS | Status: AC
  Administered 2023-07-12: 10 mL via INTRAVENOUS
  Filled 2023-07-12: qty 10

## 2023-07-12 MED ORDER — SODIUM CHLORIDE 0.9% FLUSH
10.0000 mL | Freq: Once | INTRAVENOUS | Status: AC
  Administered 2023-07-12: 10 mL via INTRAVENOUS

## 2023-07-12 MED ORDER — HEPARIN SOD (PORK) LOCK FLUSH 100 UNIT/ML IV SOLN
500.0000 [IU] | Freq: Once | INTRAVENOUS | Status: AC
  Administered 2023-07-12: 500 [IU] via INTRAVENOUS

## 2023-07-12 NOTE — Patient Instructions (Signed)
 [facility] at [facility] Discharge Instructions   You were seen and examined today by Dr. Ellin Saba.  He reviewed the results of your lab work which are normal/stable.   We will proceed with your treatment on Monday.   Return as scheduled.    Thank you for choosing [facility] at [facility] to provide your oncology and hematology care.  To afford each patient quality time with our provider, please arrive at least 15 minutes before your scheduled appointment time.   If you have a lab appointment with the Cancer Center please come in thru the Main Entrance and check in at the main information desk.  You need to re-schedule your appointment should you arrive 10 or more minutes late.  We strive to give you quality time with our providers, and arriving late affects you and other patients whose appointments are after yours.  Also, if you no show three or more times for appointments you may be dismissed from the clinic at the providers discretion.     Again, thank you for choosing [facility].  Our hope is that these requests will decrease the amount of time that you wait before being seen by our physicians.       _____________________________________________________________  Should you have questions after your visit to [facility], please contact our office at [phone] and follow the prompts.  Our office hours are 8:00 a.m. and 4:30 p.m. Monday - Friday.  Please note that voicemails left after 4:00 p.m. may not be returned until the following business day.  We are closed weekends and major holidays.  You do have access to a nurse 24-7, just call the main number to the clinic [phone] and do not press any options, hold on the line and a nurse will answer the phone.    For prescription refill requests, have your pharmacy contact our office and allow 72 hours.    Due to Covid, you will need to wear a mask upon  entering the hospital. If you do not have a mask, a mask will be given to you at the Main Entrance upon arrival. For doctor visits, patients may have 1 support person age 18 or older with them. For treatment visits, patients can not have anyone with them due to social distancing guidelines and our immunocompromised population.

## 2023-07-12 NOTE — Progress Notes (Signed)
 No treatment today per Dr. Katragadda. Potassium 3.1 patient okay to take potassium at home per MD.

## 2023-07-12 NOTE — Progress Notes (Signed)
 Patient is taking Lonsurf  as prescribed. She has not missed any doses and reports no side effects at this time.

## 2023-07-12 NOTE — Addendum Note (Signed)
 Addended by: Augie Bliss E on: 07/12/2023 12:49 PM   Modules accepted: Orders

## 2023-07-13 ENCOUNTER — Other Ambulatory Visit: Payer: Self-pay

## 2023-07-13 LAB — CEA: CEA: 199 ng/mL — ABNORMAL HIGH (ref 0.0–4.7)

## 2023-07-14 ENCOUNTER — Other Ambulatory Visit: Payer: Self-pay

## 2023-07-17 ENCOUNTER — Inpatient Hospital Stay

## 2023-07-17 VITALS — BP 132/78 | HR 73 | Temp 97.6°F | Resp 18

## 2023-07-17 DIAGNOSIS — C189 Malignant neoplasm of colon, unspecified: Secondary | ICD-10-CM

## 2023-07-17 DIAGNOSIS — Z5112 Encounter for antineoplastic immunotherapy: Secondary | ICD-10-CM | POA: Diagnosis not present

## 2023-07-17 DIAGNOSIS — Z95828 Presence of other vascular implants and grafts: Secondary | ICD-10-CM

## 2023-07-17 LAB — COMPREHENSIVE METABOLIC PANEL WITH GFR
ALT: 10 U/L (ref 0–44)
AST: 21 U/L (ref 15–41)
Albumin: 3.3 g/dL — ABNORMAL LOW (ref 3.5–5.0)
Alkaline Phosphatase: 56 U/L (ref 38–126)
Anion gap: 7 (ref 5–15)
BUN: 6 mg/dL (ref 6–20)
CO2: 20 mmol/L — ABNORMAL LOW (ref 22–32)
Calcium: 8.6 mg/dL — ABNORMAL LOW (ref 8.9–10.3)
Chloride: 111 mmol/L (ref 98–111)
Creatinine, Ser: 0.58 mg/dL (ref 0.44–1.00)
GFR, Estimated: >60 mL/min (ref >60)
Glucose, Bld: 96 mg/dL (ref 70–99)
Potassium: 3.8 mmol/L (ref 3.5–5.1)
Sodium: 138 mmol/L (ref 135–145)
Total Bilirubin: 0.4 mg/dL (ref 0.0–1.2)
Total Protein: 7.2 g/dL (ref 6.5–8.1)

## 2023-07-17 LAB — MAGNESIUM: Magnesium: 1.9 mg/dL (ref 1.7–2.4)

## 2023-07-17 LAB — CBC WITH DIFFERENTIAL/PLATELET
Abs Immature Granulocytes: 0.02 10*3/uL (ref 0.00–0.07)
Basophils Absolute: 0 10*3/uL (ref 0.0–0.1)
Basophils Relative: 0 %
Eosinophils Absolute: 0 10*3/uL (ref 0.0–0.5)
Eosinophils Relative: 1 %
HCT: 34.1 % — ABNORMAL LOW (ref 36.0–46.0)
Hemoglobin: 11.1 g/dL — ABNORMAL LOW (ref 12.0–15.0)
Immature Granulocytes: 0 %
Lymphocytes Relative: 43 %
Lymphs Abs: 1.9 10*3/uL (ref 0.7–4.0)
MCH: 32 pg (ref 26.0–34.0)
MCHC: 32.6 g/dL (ref 30.0–36.0)
MCV: 98.3 fL (ref 80.0–100.0)
Monocytes Absolute: 0.6 10*3/uL (ref 0.1–1.0)
Monocytes Relative: 13 %
Neutro Abs: 2 10*3/uL (ref 1.7–7.7)
Neutrophils Relative %: 43 %
Platelets: 229 10*3/uL (ref 150–400)
RBC: 3.47 MIL/uL — ABNORMAL LOW (ref 3.87–5.11)
RDW: 15 % (ref 11.5–15.5)
WBC: 4.6 10*3/uL (ref 4.0–10.5)
nRBC: 0 % (ref 0.0–0.2)

## 2023-07-17 MED ORDER — HEPARIN SOD (PORK) LOCK FLUSH 100 UNIT/ML IV SOLN
500.0000 [IU] | Freq: Once | INTRAVENOUS | Status: AC | PRN
  Administered 2023-07-17: 500 [IU]

## 2023-07-17 MED ORDER — SODIUM CHLORIDE 0.9 % IV SOLN: INTRAVENOUS | Status: DC

## 2023-07-17 MED ORDER — SODIUM CHLORIDE 0.9 % IV SOLN
5.0000 mg/kg | Freq: Once | INTRAVENOUS | Status: AC
  Administered 2023-07-17: 400 mg via INTRAVENOUS
  Filled 2023-07-17: qty 16

## 2023-07-17 MED ORDER — SODIUM CHLORIDE 0.9% FLUSH
10.0000 mL | INTRAVENOUS | Status: DC | PRN
Start: 2023-07-17 — End: 2023-07-17

## 2023-07-17 NOTE — Progress Notes (Signed)
 Patient presents today for Bevacizumab  infusion.  Patient is in satisfactory condition with no new complaints voiced.  Vital signs are stable.  Labs reviewed and all labs are within treatment parameters.  We will proceed with treatment per MD orders.    Treatment given today per MD orders. Tolerated infusion without adverse affects. Vital signs stable. No complaints at this time. Discharged from clinic ambulatory in stable condition. Alert and oriented x 3. F/U with [facility] as scheduled.

## 2023-07-17 NOTE — Patient Instructions (Signed)
 [facility] [place] - A DEPT OF [facility]  Discharge Instructions: Thank you for choosing  Cancer Center to provide your oncology and hematology care.  If you have a lab appointment with the Cancer Center - please note that after April 8th, 2024, all labs will be drawn in the cancer center.  You do not have to check in or register with the main entrance as you have in the past but will complete your check-in in the cancer center.  Wear comfortable clothing and clothing appropriate for easy access to any Portacath or PICC line.   We strive to give you quality time with your provider. You may need to reschedule your appointment if you arrive late (15 or more minutes).  Arriving late affects you and other patients whose appointments are after yours.  Also, if you miss three or more appointments without notifying the office, you may be dismissed from the clinic at the provider's discretion.      For prescription refill requests, have your pharmacy contact our office and allow 72 hours for refills to be completed.    Today you received the following chemotherapy and/or immunotherapy agents Avastin   To help prevent nausea and vomiting after your treatment, we encourage you to take your nausea medication as directed.  Bevacizumab Injection What is this medication? BEVACIZUMAB (be va SIZ yoo mab) treats some types of cancer. It works by blocking a protein that causes cancer cells to grow and multiply. This helps to slow or stop the spread of cancer cells. It is a monoclonal antibody. This medicine may be used for other purposes; ask your health care provider or pharmacist if you have questions. COMMON BRAND NAME(S): Alymsys, Avastin, MVASI, Rosaland Lao What should I tell my care team before I take this medication? They need to know if you have any of these conditions: Blood clots Coughing up blood Having or recent surgery Heart failure High blood  pressure History of a connection between 2 or more body parts that do not usually connect (fistula) History of a tear in your stomach or intestines Protein in your urine An unusual or allergic reaction to bevacizumab, other medications, foods, dyes, or preservatives Pregnant or trying to get pregnant Breast-feeding How should I use this medication? This medication is injected into a vein. It is given by your care team in a hospital or clinic setting. Talk to your care team the use of this medication in children. Special care may be needed. Overdosage: If you think you have taken too much of this medicine contact a poison control center or emergency room at once. NOTE: This medicine is only for you. Do not share this medicine with others. What if I miss a dose? Keep appointments for follow-up doses. It is important not to miss your dose. Call your care team if you are unable to keep an appointment. What may interact with this medication? Interactions are not expected. This list may not describe all possible interactions. Give your health care provider a list of all the medicines, herbs, non-prescription drugs, or dietary supplements you use. Also tell them if you smoke, drink alcohol, or use illegal drugs. Some items may interact with your medicine. What should I watch for while using this medication? Your condition will be monitored carefully while you are receiving this medication. You may need blood work while taking this medication. This medication may make you feel generally unwell. This is not uncommon as chemotherapy can  affect healthy cells as well as cancer cells. Report any side effects. Continue your course of treatment even though you feel ill unless your care team tells you to stop. This medication may increase your risk to bruise or bleed. Call your care team if you notice any unusual bleeding. Before having surgery, talk to your care team to make sure it is ok. This medication can  increase the risk of poor healing of your surgical site or wound. You will need to stop this medication for 28 days before surgery. After surgery, wait at least 28 days before restarting this medication. Make sure the surgical site or wound is healed enough before restarting this medication. Talk to your care team if questions. Talk to your care team if you may be pregnant. Serious birth defects can occur if you take this medication during pregnancy and for 6 months after the last dose. Contraception is recommended while taking this medication and for 6 months after the last dose. Your care team can help you find the option that works for you. Do not breastfeed while taking this medication and for 6 months after the last dose. This medication can cause infertility. Talk to your care team if you are concerned about your fertility. What side effects may I notice from receiving this medication? Side effects that you should report to your care team as soon as possible: Allergic reactions--skin rash, itching, hives, swelling of the face, lips, tongue, or throat Bleeding--bloody or black, tar-like stools, vomiting blood or brown material that looks like coffee grounds, red or dark brown urine, small red or purple spots on skin, unusual bruising or bleeding Blood clot--pain, swelling, or warmth in the leg, shortness of breath, chest pain Heart attack--pain or tightness in the chest, shoulders, arms, or jaw, nausea, shortness of breath, cold or clammy skin, feeling faint or lightheaded Heart failure--shortness of breath, swelling of the ankles, feet, or hands, sudden weight gain, unusual weakness or fatigue Increase in blood pressure Infection--fever, chills, cough, sore throat, wounds that don't heal, pain or trouble when passing urine, general feeling of discomfort or being unwell Infusion reactions--chest pain, shortness of breath or trouble breathing, feeling faint or lightheaded Kidney injury--decrease in  the amount of urine, swelling of the ankles, hands, or feet Stomach pain that is severe, does not go away, or gets worse Stroke--sudden numbness or weakness of the face, arm, or leg, trouble speaking, confusion, trouble walking, loss of balance or coordination, dizziness, severe headache, change in vision Sudden and severe headache, confusion, change in vision, seizures, which may be signs of posterior reversible encephalopathy syndrome (PRES) Side effects that usually do not require medical attention (report to your care team if they continue or are bothersome): Back pain Change in taste Diarrhea Dry skin Increased tears Nosebleed This list may not describe all possible side effects. Call your doctor for medical advice about side effects. You may report side effects to FDA at 1-800-FDA-1088. Where should I keep my medication? This medication is given in a hospital or clinic. It will not be stored at home. NOTE: This sheet is a summary. It may not cover all possible information. If you have questions about this medicine, talk to your doctor, pharmacist, or health care provider.  2024 Elsevier/Gold Standard (2021-06-11 00:00:00)  BELOW ARE SYMPTOMS THAT SHOULD BE REPORTED IMMEDIATELY: *FEVER GREATER THAN 100.4 F (38 C) OR HIGHER *CHILLS OR SWEATING *NAUSEA AND VOMITING THAT IS NOT CONTROLLED WITH YOUR NAUSEA MEDICATION *UNUSUAL SHORTNESS OF BREATH *UNUSUAL BRUISING  OR BLEEDING *URINARY PROBLEMS (pain or burning when urinating, or frequent urination) *BOWEL PROBLEMS (unusual diarrhea, constipation, pain near the anus) TENDERNESS IN MOUTH AND THROAT WITH OR WITHOUT PRESENCE OF ULCERS (sore throat, sores in mouth, or a toothache) UNUSUAL RASH, SWELLING OR PAIN  UNUSUAL VAGINAL DISCHARGE OR ITCHING   Items with * indicate a potential emergency and should be followed up as soon as possible or go to the Emergency Department if any problems should occur.  Please show the CHEMOTHERAPY ALERT  CARD or IMMUNOTHERAPY ALERT CARD at check-in to the Emergency Department and triage nurse.  Should you have questions after your visit or need to cancel or reschedule your appointment, please contact [facility] [place] - A DEPT OF [facility] [phone]  and follow the prompts.  Office hours are 8:00 a.m. to 4:30 p.m. Monday - Friday. Please note that voicemails left after 4:00 p.m. may not be returned until the following business day.  We are closed weekends and major holidays. You have access to a nurse at all times for urgent questions. Please call the main number to the clinic [phone] and follow the prompts.  For any non-urgent questions, you may also contact your provider using MyChart. We now offer e-Visits for anyone 55 and older to request care online for non-urgent symptoms. For details visit [email or web address].   Also download the MyChart app! Go to the app store, search "MyChart", open the app, select [place], and log in with your MyChart username and password.

## 2023-07-22 ENCOUNTER — Other Ambulatory Visit: Payer: Self-pay

## 2023-07-23 ENCOUNTER — Other Ambulatory Visit: Payer: Self-pay

## 2023-07-24 IMAGING — PT NM PET TUM IMG INITIAL (PI) SKULL BASE T - THIGH
1 series · 4 of 4 positions shown · non-contrast
Comparison: Abdominopelvic CT 04/28/2021 and 01/10/2021

CLINICAL DATA: Initial treatment strategy for metastatic colon
cancer. Partial colectomy 03/26/2021.

EXAM:
NUCLEAR MEDICINE PET SKULL BASE TO THIGH
TECHNIQUE: 8.38 mCi F-18 FDG was injected intravenously. Full-ring PET imaging
was performed from the skull base to thigh after the radiotracer. CT
data was obtained and used for attenuation correction and anatomic
localization.
Fasting blood glucose: 98 mg/dl

[Series 1032: results mm oncology reading · 1.0mm · 1.00mm/px · 4 of 4 slices shown]
[im 1/4]
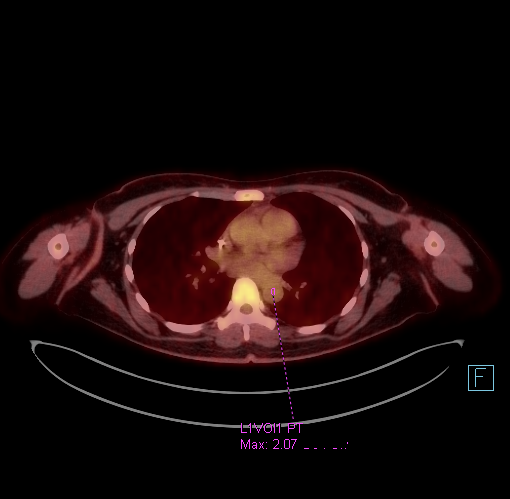
[im 2/4]
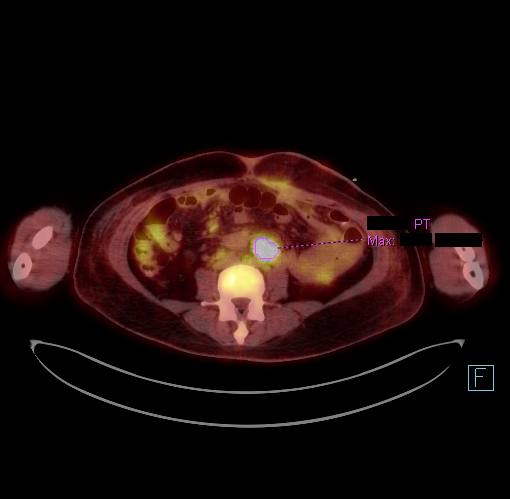
[im 3/4]
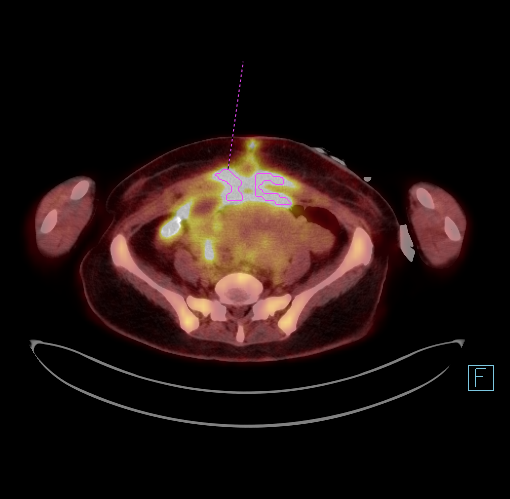
[im 4/4]
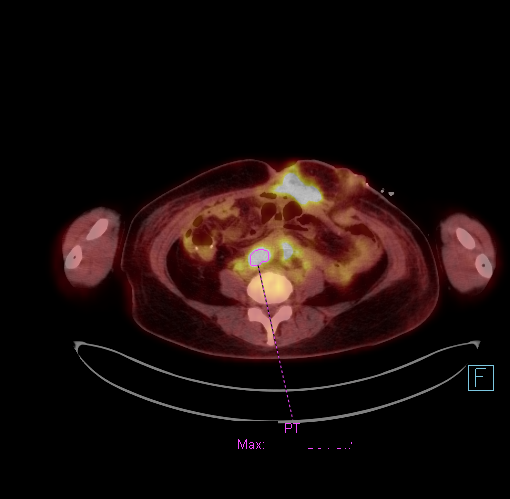

[4 of 4 positions shown; findings below may reference images not displayed]

FINDINGS: Mediastinal blood pool activity: SUV max

NECK:

No hypermetabolic cervical lymph nodes are identified.There are no
lesions of the pharyngeal mucosal space.

Incidental CT findings: none

CHEST:

There are no hypermetabolic mediastinal, hilar or axillary lymph
nodes. No hypermetabolic pulmonary activity or suspicious
nodularity.

Incidental CT findings: Left subclavian Port-A-Cath extends to the
superior cavoatrial junction. There is minimal dependent atelectasis
in both lungs.

ABDOMEN/PELVIS:

There is no hypermetabolic activity within the liver, adrenal
glands, spleen or pancreas. There are intensely hypermetabolic
common iliac nodes bilaterally. A 1.6 cm node on the left (image
156/3) has an SUV max of 15.0. A 1.2 cm node on the right (image
165/3) has an SUV max of 13.8. There is multifocal hypermetabolic
activity within the anterior abdominal wall (SUV max 16.0). In
addition, there is hypermetabolic activity anteriorly in the
peritoneal cavity of the false pelvis, extending along the right
side of the uterus. Hypermetabolic activity surrounds the previously
demonstrated thick walled gas and fluid collection which appears
similar in size to recent CT, measuring 4.5 x 2.2 cm on image 168/3.
Additional loculated peritoneal fluid is present anteriorly,
extending into the midline incision, unchanged from recent CT.

Incidental CT findings: Prior cholecystectomy with mild residual
extrahepatic biliary dilatation. Calcified uterine fibroids.

SKELETON:

Low-level diffuse osseous metabolic activity is likely treatment
related. No focal lesions identified to suggest metastatic disease.

Incidental CT findings: none
IMPRESSION: 1. Multifocal hypermetabolic nodal, peritoneal and anterior
abdominal wall hypermetabolic activity. Based on the presence of
metastatic disease within lymph nodes and along the serosal surface
of the small bowel at time of the patient's prior surgery, these
findings are worrisome for progressive nodal metastatic disease and
peritoneal carcinomatosis. Infectious peritonitis from anastomotic
leak would have a similar appearance given recent CT findings.
2. No evidence of hepatic metastatic disease or other distant
metastases.
3. Uterine fibroids.

## 2023-07-26 ENCOUNTER — Inpatient Hospital Stay

## 2023-07-26 ENCOUNTER — Inpatient Hospital Stay: Admitting: Hematology

## 2023-07-29 ENCOUNTER — Other Ambulatory Visit: Payer: Self-pay

## 2023-07-31 ENCOUNTER — Inpatient Hospital Stay

## 2023-07-31 VITALS — BP 125/75 | HR 71 | Temp 97.3°F | Resp 18

## 2023-07-31 VITALS — BP 134/79 | HR 79 | Temp 97.0°F | Resp 18 | Wt 169.8 lb

## 2023-07-31 DIAGNOSIS — C189 Malignant neoplasm of colon, unspecified: Secondary | ICD-10-CM

## 2023-07-31 DIAGNOSIS — Z5112 Encounter for antineoplastic immunotherapy: Secondary | ICD-10-CM | POA: Diagnosis not present

## 2023-07-31 DIAGNOSIS — Z95828 Presence of other vascular implants and grafts: Secondary | ICD-10-CM

## 2023-07-31 LAB — URINALYSIS, DIPSTICK ONLY
Bilirubin Urine: NEGATIVE
Glucose, UA: NEGATIVE mg/dL
Hgb urine dipstick: NEGATIVE
Ketones, ur: NEGATIVE mg/dL
Leukocytes,Ua: NEGATIVE
Nitrite: NEGATIVE
Protein, ur: 30 mg/dL — AB
Specific Gravity, Urine: 1.016 (ref 1.005–1.030)
pH: 5 (ref 5.0–8.0)

## 2023-07-31 LAB — COMPREHENSIVE METABOLIC PANEL WITH GFR
ALT: 10 U/L (ref 0–44)
AST: 18 U/L (ref 15–41)
Albumin: 3.3 g/dL — ABNORMAL LOW (ref 3.5–5.0)
Alkaline Phosphatase: 55 U/L (ref 38–126)
Anion gap: 10 (ref 5–15)
BUN: 9 mg/dL (ref 6–20)
CO2: 20 mmol/L — ABNORMAL LOW (ref 22–32)
Calcium: 8.7 mg/dL — ABNORMAL LOW (ref 8.9–10.3)
Chloride: 108 mmol/L (ref 98–111)
Creatinine, Ser: 0.62 mg/dL (ref 0.44–1.00)
GFR, Estimated: >60 mL/min (ref >60)
Glucose, Bld: 95 mg/dL (ref 70–99)
Potassium: 3.9 mmol/L (ref 3.5–5.1)
Sodium: 138 mmol/L (ref 135–145)
Total Bilirubin: 0.6 mg/dL (ref 0.0–1.2)
Total Protein: 7 g/dL (ref 6.5–8.1)

## 2023-07-31 LAB — CBC WITH DIFFERENTIAL/PLATELET
Abs Immature Granulocytes: 0.01 10*3/uL (ref 0.00–0.07)
Basophils Absolute: 0 10*3/uL (ref 0.0–0.1)
Basophils Relative: 1 %
Eosinophils Absolute: 0 10*3/uL (ref 0.0–0.5)
Eosinophils Relative: 1 %
HCT: 30.5 % — ABNORMAL LOW (ref 36.0–46.0)
Hemoglobin: 10.7 g/dL — ABNORMAL LOW (ref 12.0–15.0)
Immature Granulocytes: 0 %
Lymphocytes Relative: 45 %
Lymphs Abs: 1.5 10*3/uL (ref 0.7–4.0)
MCH: 33.4 pg (ref 26.0–34.0)
MCHC: 35.1 g/dL (ref 30.0–36.0)
MCV: 95.3 fL (ref 80.0–100.0)
Monocytes Absolute: 0.1 10*3/uL (ref 0.1–1.0)
Monocytes Relative: 3 %
Neutro Abs: 1.7 10*3/uL (ref 1.7–7.7)
Neutrophils Relative %: 50 %
Platelets: 202 10*3/uL (ref 150–400)
RBC: 3.2 MIL/uL — ABNORMAL LOW (ref 3.87–5.11)
RDW: 13.6 % (ref 11.5–15.5)
WBC: 3.4 10*3/uL — ABNORMAL LOW (ref 4.0–10.5)
nRBC: 0 % (ref 0.0–0.2)

## 2023-07-31 LAB — MAGNESIUM: Magnesium: 1.8 mg/dL (ref 1.7–2.4)

## 2023-07-31 LAB — PREGNANCY, URINE: Preg Test, Ur: NEGATIVE

## 2023-07-31 MED ORDER — HEPARIN SOD (PORK) LOCK FLUSH 100 UNIT/ML IV SOLN
500.0000 [IU] | Freq: Once | INTRAVENOUS | Status: AC | PRN
  Administered 2023-07-31: 500 [IU]

## 2023-07-31 MED ORDER — SODIUM CHLORIDE 0.9% FLUSH
10.0000 mL | INTRAVENOUS | Status: DC | PRN
  Administered 2023-07-31: 10 mL

## 2023-07-31 MED ORDER — SODIUM CHLORIDE 0.9 % IV SOLN: INTRAVENOUS | Status: DC

## 2023-07-31 MED ORDER — SODIUM CHLORIDE 0.9 % IV SOLN
5.0000 mg/kg | Freq: Once | INTRAVENOUS | Status: AC
  Administered 2023-07-31: 400 mg via INTRAVENOUS
  Filled 2023-07-31: qty 16

## 2023-07-31 MED ORDER — SODIUM CHLORIDE 0.9% FLUSH
10.0000 mL | Freq: Once | INTRAVENOUS | Status: AC
  Administered 2023-07-31: 10 mL via INTRAVENOUS

## 2023-07-31 NOTE — Progress Notes (Signed)
 Patient presents today fro Vegzelma  infusion per providers order.  Vital signs and labs within parameters fro treatment.  Patient has no new complaints at this time.    Treatment given today per MD orders.  Stable during infusion without adverse affects.  Vital signs stable.  No complaints at this time.  Discharge from clinic ambulatory in stable condition.  Alert and oriented X 3.  Follow up with [facility] as scheduled.

## 2023-07-31 NOTE — Patient Instructions (Signed)
 [facility] [place] - A DEPT OF [facility]  Discharge Instructions: Thank you for choosing [facility] to provide your oncology and hematology care.  If you have a lab appointment with the Cancer Center - please note that after April 8th, 2024, all labs will be drawn in the cancer center.  You do not have to check in or register with the main entrance as you have in the past but will complete your check-in in the cancer center.  Wear comfortable clothing and clothing appropriate for easy access to any Portacath or PICC line.   We strive to give you quality time with your provider. You may need to reschedule your appointment if you arrive late (15 or more minutes).  Arriving late affects you and other patients whose appointments are after yours.  Also, if you miss three or more appointments without notifying the office, you may be dismissed from the clinic at the provider's discretion.      For prescription refill requests, have your pharmacy contact our office and allow 72 hours for refills to be completed.    Today you received the following chemotherapy and/or immunotherapy agents Vegzelma    To help prevent nausea and vomiting after your treatment, we encourage you to take your nausea medication as directed.  BELOW ARE SYMPTOMS THAT SHOULD BE REPORTED IMMEDIATELY: *FEVER GREATER THAN 100.4 F (38 C) OR HIGHER *CHILLS OR SWEATING *NAUSEA AND VOMITING THAT IS NOT CONTROLLED WITH YOUR NAUSEA MEDICATION *UNUSUAL SHORTNESS OF BREATH *UNUSUAL BRUISING OR BLEEDING *URINARY PROBLEMS (pain or burning when urinating, or frequent urination) *BOWEL PROBLEMS (unusual diarrhea, constipation, pain near the anus) TENDERNESS IN MOUTH AND THROAT WITH OR WITHOUT PRESENCE OF ULCERS (sore throat, sores in mouth, or a toothache) UNUSUAL RASH, SWELLING OR PAIN  UNUSUAL VAGINAL DISCHARGE OR ITCHING   Items with * indicate a potential emergency and should be followed up as  soon as possible or go to the Emergency Department if any problems should occur.  Please show the CHEMOTHERAPY ALERT CARD or IMMUNOTHERAPY ALERT CARD at check-in to the Emergency Department and triage nurse.  Should you have questions after your visit or need to cancel or reschedule your appointment, please contact [facility] [place] - A DEPT OF [facility] [phone]  and follow the prompts.  Office hours are 8:00 a.m. to 4:30 p.m. Monday - Friday. Please note that voicemails left after 4:00 p.m. may not be returned until the following business day.  We are closed weekends and major holidays. You have access to a nurse at all times for urgent questions. Please call the main number to the clinic [phone] and follow the prompts.  For any non-urgent questions, you may also contact your provider using MyChart. We now offer e-Visits for anyone 51 and older to request care online for non-urgent symptoms. For details visit [email or web address].   Also download the MyChart app! Go to the app store, search "MyChart", open the app, select [facility], and log in with your MyChart username and password.

## 2023-08-01 LAB — CEA: CEA: 231 ng/mL — ABNORMAL HIGH (ref 0.0–4.7)

## 2023-08-08 ENCOUNTER — Other Ambulatory Visit (HOSPITAL_COMMUNITY): Payer: Self-pay

## 2023-08-10 ENCOUNTER — Other Ambulatory Visit (HOSPITAL_COMMUNITY): Payer: Self-pay

## 2023-08-10 ENCOUNTER — Other Ambulatory Visit: Payer: Self-pay

## 2023-08-10 NOTE — Progress Notes (Signed)
 Specialty Pharmacy Refill Coordination Note  Diana Fox is a 53 y.o. female contacted today regarding refills of specialty medication(s) Trifluridine -Tipiracil  (Lonsurf )   Patient requested [facility] at [facility] at [place] date: 08/10/23   Medication will be filled on 08/10/23.

## 2023-08-14 ENCOUNTER — Inpatient Hospital Stay

## 2023-08-14 ENCOUNTER — Inpatient Hospital Stay: Attending: Hematology | Admitting: Hematology

## 2023-08-14 VITALS — BP 131/88 | HR 74 | Resp 19

## 2023-08-14 DIAGNOSIS — Z793 Long term (current) use of hormonal contraceptives: Secondary | ICD-10-CM | POA: Diagnosis not present

## 2023-08-14 DIAGNOSIS — R599 Enlarged lymph nodes, unspecified: Secondary | ICD-10-CM | POA: Insufficient documentation

## 2023-08-14 DIAGNOSIS — C189 Malignant neoplasm of colon, unspecified: Secondary | ICD-10-CM

## 2023-08-14 DIAGNOSIS — Z5112 Encounter for antineoplastic immunotherapy: Secondary | ICD-10-CM | POA: Diagnosis present

## 2023-08-14 DIAGNOSIS — Z95828 Presence of other vascular implants and grafts: Secondary | ICD-10-CM

## 2023-08-14 DIAGNOSIS — I10 Essential (primary) hypertension: Secondary | ICD-10-CM | POA: Diagnosis not present

## 2023-08-14 DIAGNOSIS — G629 Polyneuropathy, unspecified: Secondary | ICD-10-CM | POA: Insufficient documentation

## 2023-08-14 DIAGNOSIS — C187 Malignant neoplasm of sigmoid colon: Secondary | ICD-10-CM | POA: Diagnosis not present

## 2023-08-14 DIAGNOSIS — K648 Other hemorrhoids: Secondary | ICD-10-CM | POA: Insufficient documentation

## 2023-08-14 DIAGNOSIS — K56699 Other intestinal obstruction unspecified as to partial versus complete obstruction: Secondary | ICD-10-CM | POA: Insufficient documentation

## 2023-08-14 DIAGNOSIS — E876 Hypokalemia: Secondary | ICD-10-CM | POA: Insufficient documentation

## 2023-08-14 LAB — CBC WITH DIFFERENTIAL/PLATELET
Abs Immature Granulocytes: 0.01 K/uL (ref 0.00–0.07)
Basophils Absolute: 0 K/uL (ref 0.0–0.1)
Basophils Relative: 0 %
Eosinophils Absolute: 0 K/uL (ref 0.0–0.5)
Eosinophils Relative: 0 %
HCT: 30.9 % — ABNORMAL LOW (ref 36.0–46.0)
Hemoglobin: 10.8 g/dL — ABNORMAL LOW (ref 12.0–15.0)
Immature Granulocytes: 0 %
Lymphocytes Relative: 50 %
Lymphs Abs: 1.7 K/uL (ref 0.7–4.0)
MCH: 33.9 pg (ref 26.0–34.0)
MCHC: 35 g/dL (ref 30.0–36.0)
MCV: 96.9 fL (ref 80.0–100.0)
Monocytes Absolute: 0.4 K/uL (ref 0.1–1.0)
Monocytes Relative: 12 %
Neutro Abs: 1.3 K/uL — ABNORMAL LOW (ref 1.7–7.7)
Neutrophils Relative %: 38 %
Platelets: 210 K/uL (ref 150–400)
RBC: 3.19 MIL/uL — ABNORMAL LOW (ref 3.87–5.11)
RDW: 15.1 % (ref 11.5–15.5)
WBC: 3.4 K/uL — ABNORMAL LOW (ref 4.0–10.5)
nRBC: 0 % (ref 0.0–0.2)

## 2023-08-14 LAB — URINALYSIS, DIPSTICK ONLY
Bilirubin Urine: NEGATIVE
Glucose, UA: NEGATIVE mg/dL
Hgb urine dipstick: NEGATIVE
Ketones, ur: NEGATIVE mg/dL
Leukocytes,Ua: NEGATIVE
Nitrite: NEGATIVE
Protein, ur: 30 mg/dL — AB
Specific Gravity, Urine: 1.018 (ref 1.005–1.030)
pH: 5 (ref 5.0–8.0)

## 2023-08-14 LAB — COMPREHENSIVE METABOLIC PANEL WITH GFR
ALT: 9 U/L (ref 0–44)
AST: 17 U/L (ref 15–41)
Albumin: 3.4 g/dL — ABNORMAL LOW (ref 3.5–5.0)
Alkaline Phosphatase: 59 U/L (ref 38–126)
Anion gap: 10 (ref 5–15)
BUN: 10 mg/dL (ref 6–20)
CO2: 19 mmol/L — ABNORMAL LOW (ref 22–32)
Calcium: 8.7 mg/dL — ABNORMAL LOW (ref 8.9–10.3)
Chloride: 107 mmol/L (ref 98–111)
Creatinine, Ser: 0.71 mg/dL (ref 0.44–1.00)
GFR, Estimated: >60 mL/min (ref >60)
Glucose, Bld: 108 mg/dL — ABNORMAL HIGH (ref 70–99)
Potassium: 3.6 mmol/L (ref 3.5–5.1)
Sodium: 136 mmol/L (ref 135–145)
Total Bilirubin: 0.6 mg/dL (ref 0.0–1.2)
Total Protein: 7 g/dL (ref 6.5–8.1)

## 2023-08-14 MED ORDER — SODIUM CHLORIDE 0.9% FLUSH
10.0000 mL | INTRAVENOUS | Status: DC | PRN
  Administered 2023-08-14: 10 mL via INTRAVENOUS

## 2023-08-14 MED ORDER — SODIUM CHLORIDE 0.9 % IV SOLN
5.0000 mg/kg | Freq: Once | INTRAVENOUS | Status: AC
  Administered 2023-08-14: 400 mg via INTRAVENOUS
  Filled 2023-08-14: qty 16

## 2023-08-14 MED ORDER — HEPARIN SOD (PORK) LOCK FLUSH 100 UNIT/ML IV SOLN
500.0000 [IU] | Freq: Once | INTRAVENOUS | Status: AC | PRN
  Administered 2023-08-14: 500 [IU]

## 2023-08-14 MED ORDER — SODIUM CHLORIDE 0.9 % IV SOLN: INTRAVENOUS | Status: DC

## 2023-08-14 NOTE — Patient Instructions (Signed)
 [facility] [place] - A DEPT OF [facility]  Discharge Instructions: Thank you for choosing [facility] to provide your oncology and hematology care.  If you have a lab appointment with the Cancer Center - please note that after April 8th, 2024, all labs will be drawn in the cancer center.  You do not have to check in or register with the main entrance as you have in the past but will complete your check-in in the cancer center.  Wear comfortable clothing and clothing appropriate for easy access to any Portacath or PICC line.   We strive to give you quality time with your provider. You may need to reschedule your appointment if you arrive late (15 or more minutes).  Arriving late affects you and other patients whose appointments are after yours.  Also, if you miss three or more appointments without notifying the office, you may be dismissed from the clinic at the provider's discretion.      For prescription refill requests, have your pharmacy contact our office and allow 72 hours for refills to be completed.    Today you received the following chemotherapy and/or immunotherapy agents vegzelma    To help prevent nausea and vomiting after your treatment, we encourage you to take your nausea medication as directed.  BELOW ARE SYMPTOMS THAT SHOULD BE REPORTED IMMEDIATELY: *FEVER GREATER THAN 100.4 F (38 C) OR HIGHER *CHILLS OR SWEATING *NAUSEA AND VOMITING THAT IS NOT CONTROLLED WITH YOUR NAUSEA MEDICATION *UNUSUAL SHORTNESS OF BREATH *UNUSUAL BRUISING OR BLEEDING *URINARY PROBLEMS (pain or burning when urinating, or frequent urination) *BOWEL PROBLEMS (unusual diarrhea, constipation, pain near the anus) TENDERNESS IN MOUTH AND THROAT WITH OR WITHOUT PRESENCE OF ULCERS (sore throat, sores in mouth, or a toothache) UNUSUAL RASH, SWELLING OR PAIN  UNUSUAL VAGINAL DISCHARGE OR ITCHING   Items with * indicate a potential emergency and should be followed up as  soon as possible or go to the Emergency Department if any problems should occur.  Please show the CHEMOTHERAPY ALERT CARD or IMMUNOTHERAPY ALERT CARD at check-in to the Emergency Department and triage nurse.  Should you have questions after your visit or need to cancel or reschedule your appointment, please contact [facility] [place] - A DEPT OF [facility] [phone]  and follow the prompts.  Office hours are 8:00 a.m. to 4:30 p.m. Monday - Friday. Please note that voicemails left after 4:00 p.m. may not be returned until the following business day.  We are closed weekends and major holidays. You have access to a nurse at all times for urgent questions. Please call the main number to the clinic [phone] and follow the prompts.  For any non-urgent questions, you may also contact your provider using MyChart. We now offer e-Visits for anyone 74 and older to request care online for non-urgent symptoms. For details visit [email or web address].   Also download the MyChart app! Go to the app store, search MyChart, open the app, select [place], and log in with your MyChart username and password.

## 2023-08-14 NOTE — Progress Notes (Signed)
 [facility] [address] [place] [address]    Clinic Day:  08/14/23   Referring physician: Toribio Jerel MATSU, MD  Patient Care Team: Toribio Jerel MATSU, MD as PCP - General (Family Medicine) Rogers Hai, MD as Medical Oncologist (Medical Oncology) Celestia Joesph SQUIBB, RN as Oncology Nurse Navigator (Medical Oncology)   ASSESSMENT & PLAN:   Assessment: 1. 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  Biopsy consistent with moderately to poorly differentiated adenocarcinoma. - CTAP with contrast on 01/10/2021: Diffusely thickened, 10 cm segment of distal sigmoid colon with adjacent moderate inflammatory reaction, suspected 2 sites of small bowel fistulization to the diseased segment and a rim-enhancing low-density 2.5 x 1.8 cm stricture medial to the mid segment which could represent a necrotic lymph node or contained perforation/abscess.  There are bilateral mildly enlarged common iliac chain nodes.  Mildly dilated mid to lower abdominal small bowel segments.  Mildly prominent slightly steatotic liver. - Sigmoid colon resection, partial small bowel resection and Hartman's procedure on 03/26/2021: Moderately to poorly differentiated adenocarcinoma involving the subserosal adipose tissue and present at the margin.  9/9 lymph nodes positive for metastatic carcinoma.  Small bowel resection is positive for adenocarcinoma nodules in the serosal fat.  PT4PN2B.  MMR is preserved. - Operative report mentions obvious extension of the tumor outside the colon to the pelvic brim, unable to fully excise all of the tumor in the pelvis. - NGS testing (04/26/2021): KRAS/NRAS negative, BRAF negative, HER2 IHC (0) negative, MSI-stable - PET scan on 04/29/2021: Multifocal hypermetabolic  nodule, peritoneal and anterior abdominal wall adenopathy.  Findings worrisome for peritoneal carcinomatosis.  No evidence of hepatic metastatic disease or distant mets. - FOLFOXIRI and Vectibix  started on 05/17/2021, oxaliplatin  discontinued in October 2023.  FOLFIRI and Vectibix  continued until 12/20/2022 with progression. - We will see how she responds and will refer to Dr. Debora for resection/HIPEC therapy.  Colonoscopy through stoma and rectal stump on 10/04/2021 was negative. - FOLFOX and bevacizumab  started on 01/18/2023 and discontinued after 05/31/2023 for progression. - Lonsurf  and bevacizumab  started  cycle 1 on 06/19/2023 - Guardant360 (06/16/2023): GNAS, T p53, APC, MSI high not detected, TMB-low.   2. Social/family history: - She lives at home with her family.  She works as a Lawyer at [employer].  Non-smoker. - Paternal cousin had cancer.  Maternal aunt had cancer.    Plan: 1. Stage IV sigmoid colon adenocarcinoma, MSI-stable, RAS/BRAF negative: - CT CAP (06/06/2020): Enlargement of complex mixed cystic solid mass measuring 15.7 x 11.4 cm.  Mass has been present since August 2024, consistent with metastatic disease.  Soft tissue mass/lymph node in the sigmoid mesentery along the left common iliac artery measures 2.4 x 2.1 cm, previously 2.3 x 1.9 cm. - She started cycle 2 on 07/17/2023.  She did not experience any significant GI side effects.  No mucositis reported.  No abdominal pain reported. - Labs today: Normal LFTs and creatinine.  Electrolytes are normal.  White count is slightly low at 3.4 with ANC of 1.3.  CEA was 231 on 07/31/2023, up from 199 on 07/12/2023. - She will proceed with cycle 3 of Lonsurf  today.  She will receive bevacizumab  today and in 2 weeks.  RTC 4 weeks for follow-up.  Will plan on imaging with CT CAP prior to  next visit.   2.  Hypokalemia: - Continue potassium 40 mill equivalents twice daily.  Potassium is 3.6 today.  3.  Peripheral neuropathy: - Numbness  in the feet, left more than right is stable.  She also noticed hand numbness on and off which is new.  4.  Hypertension: - Continue Norvasc  10 mg daily and Bystolic  10 mg daily.  Blood pressure today was 137/85.    Orders Placed This Encounter  Procedures   CT CHEST ABDOMEN PELVIS W CONTRAST    Standing Status:   Future    Expected Date:   09/14/2023    Expiration Date:   08/13/2024    If indicated for the ordered procedure, I authorize the administration of contrast media per Radiology protocol:   Yes    Does the patient have a contrast media/X-ray dye allergy?:   No    Preferred imaging location?:   [facility]    Release to patient:   Immediate    If indicated for the ordered procedure, I authorize the administration of oral contrast media per Radiology protocol:   Yes   CEA    Standing Status:   Future    Expected Date:   08/28/2023    Expiration Date:   08/27/2024      LILLETTE Verneta SAUNDERS Teague,acting as a scribe for Alean Stands, MD.,have documented all relevant documentation on the behalf of Alean Stands, MD,as directed by  Alean Stands, MD while in the presence of Alean Stands, MD.  I, Alean Stands MD, have reviewed the above documentation for accuracy and completeness, and I agree with the above.      Alean Stands, MD   7/7/202512:02 PM  CHIEF COMPLAINT:   Diagnosis: stage IV sigmoid colon adenocarcinoma    Cancer Staging  Colon carcinoma [facility]) Staging form: Colon and Rectum, AJCC 8th Edition - Clinical stage from 04/06/2021: Stage IVC (cT4a, cN2b, pM1c) - Signed by Stands Alean, MD on 05/04/2021    Prior Therapy: 1. FOLFOXIRI and Vectibix , 05/17/21 - 11/2021 2. FOLFIRI and Vectibix , 11/2021 - 12/20/22  Current Therapy:  FOLFOX and bevacizumab     HISTORY OF PRESENT ILLNESS:   Oncology History  Colon carcinoma (HCC)  03/26/2021 Initial Diagnosis   Colon carcinoma (HCC)   04/06/2021 Cancer Staging   Staging  form: Colon and Rectum, AJCC 8th Edition - Clinical stage from 04/06/2021: Stage IVC (cT4a, cN2b, pM1c) - Signed by Stands Alean, MD on 05/04/2021 Histopathologic type: Adenocarcinoma, NOS Stage prefix: Initial diagnosis Total positive nodes: 9 Total nodes examined: 9 Tumor deposits (TD): Present Carcinoembryonic antigen (CEA) (ng/mL): 61.5 Perineural invasion (PNI): Present Microsatellite instability (MSI): Stable KRAS mutation: Negative NRAS mutation: Negative BRAF mutation: Negative   05/17/2021 - 10/08/2021 Chemotherapy   Patient is on Treatment Plan : COLORECTAL FOLFOXIRI + Panitumumab  q14d     05/17/2021 - 12/22/2022 Chemotherapy   Patient is on Treatment Plan : COLORECTAL FOLFIRI + VECTIBIX  q14d     01/18/2023 - 06/02/2023 Chemotherapy   Patient is on Treatment Plan : COLORECTAL FOLFOX + Bevacizumab  q14d     06/14/2023 -  Chemotherapy   Patient is on Treatment Plan : COLORECTAL Bevacizumab  + Trifluridine /Tipiracil  q28d        INTERVAL HISTORY:   Aibhlinn is a 53 y.o. female presenting to clinic today for follow up of stage IV sigmoid colon adenocarcinoma. She was last seen by me on 07/12/23.  Today, she states that she is doing well overall. Her appetite level is at 100%. Her energy  level is at 100%.  PAST MEDICAL HISTORY:   Past Medical History: Past Medical History:  Diagnosis Date   Anemia    Back pain    Hypertension    Port-A-Cath in place 05/10/2021    Surgical History: Past Surgical History:  Procedure Laterality Date   BIOPSY  03/02/2021   Procedure: BIOPSY;  Surgeon: Cindie Carlin POUR, DO;  [facility];  Service: Endoscopy;;   BOWEL RESECTION N/A 03/26/2021   Procedure: SMALL BOWEL RESECTION;  Surgeon: Mavis Anes, MD;  Location: [facility];  Service: General;  Laterality: N/A;   CHOLECYSTECTOMY     COLONOSCOPY WITH PROPOFOL  N/A 10/04/2021   Procedure: COLONOSCOPY WITH PROPOFOL ;  Surgeon: Cindie Carlin POUR, DO;  [facility]: [facility];  Service:  Endoscopy;  Laterality: N/A;  12:15PM, VIA OSTOMY   COLOSTOMY N/A 03/26/2021   Procedure: COLOSTOMY;  Surgeon: Mavis Anes, MD;  Location: [facility];  Service: General;  Laterality: N/A;   ESOPHAGOGASTRODUODENOSCOPY (EGD) WITH PROPOFOL  N/A 03/02/2021   Procedure: ESOPHAGOGASTRODUODENOSCOPY (EGD) WITH PROPOFOL ;  Surgeon: Cindie Carlin POUR, DO;  [facility];  Service: Endoscopy;  Laterality: N/A;   PARTIAL COLECTOMY N/A 03/26/2021   Procedure: PARTIAL COLECTOMY;  Surgeon: Mavis Anes, MD;  Location: [facility];  Service: General;  Laterality: N/A;   PORTACATH PLACEMENT Left 04/14/2021   Procedure: INSERTION PORT-A-CATH;  Surgeon: Mavis Anes, MD;  Location: [facility];  Service: General;  Laterality: Left;   SCLEROTHERAPY  03/02/2021   Procedure: MATIAS;  Surgeon: Cindie Carlin POUR, DO;  [facility];  Service: Endoscopy;;   SIGMOIDOSCOPY  03/02/2021   Procedure: KINGSTON;  Surgeon: Cindie Carlin POUR, DO;  [facility];  Service: Endoscopy;;    Social History: Social History   Socioeconomic History   Marital status: Married    Spouse name: Not on file   Number of children: Not on file   Years of education: Not on file   Highest education level: Not on file  Occupational History   Not on file  Tobacco Use   Smoking status: Never   Smokeless tobacco: Never  Vaping Use   Vaping status: Never Used  Substance and Sexual Activity   Alcohol use: No   Drug use: No   Sexual activity: Not on file  Other Topics Concern   Not on file  Social History Narrative   Not on file   Social Drivers of Health   Financial Resource Strain: Medium Risk (06/20/2023)   Received from [facility]   Overall Financial Resource Strain (CARDIA)    Difficulty of Paying Living Expenses: Somewhat hard  Food Insecurity: Food Insecurity Present (06/20/2023)   Received from [facility]   Hunger Vital Sign    Within the past 12 months, you worried that your food would  run out before you got the money to buy more.: Sometimes true    Within the past 12 months, the food you bought just didn't last and you didn't have money to get more.: Sometimes true  Transportation Needs: No Transportation Needs (06/20/2023)   Received from [facility]   PRAPARE - Transportation    Lack of Transportation (Medical): No    Lack of Transportation (Non-Medical): No  Physical Activity: Inactive (06/20/2023)   Received from [facility]   Exercise Vital Sign    On average, how many days per week do you engage in moderate to strenuous exercise (like a brisk walk)?: 0 days  On average, how many minutes do you engage in exercise at this level?: 0 min  Stress: No Stress Concern Present (06/20/2023)   Received from [facility] of Occupational Health - Occupational Stress Questionnaire    Feeling of Stress : Not at all  Social Connections: Moderately Isolated (06/20/2023)   Received from [facility]   Social Connection and Isolation Panel    In a typical week, how many times do you talk on the phone with family, friends, or neighbors?: More than three times a week    How often do you get together with friends or relatives?: Once a week    How often do you attend church or religious services?: More than 4 times per year    Do you belong to any clubs or organizations such as church groups, unions, fraternal or athletic groups, or school groups?: No    How often do you attend meetings of the clubs or organizations you belong to?: Never    Are you married, widowed, divorced, separated, never married, or living with a partner?: Divorced  Intimate Partner Violence: Not At Risk (06/20/2023)   Received from [facility]   Humiliation, Afraid, Rape, and Kick questionnaire    Within the last year, have you been afraid of your partner or ex-partner?: No    Within the last year, have you been humiliated or emotionally abused in other ways by your partner or  ex-partner?: No    Within the last year, have you been kicked, hit, slapped, or otherwise physically hurt by your partner or ex-partner?: No    Within the last year, have you been raped or forced to have any kind of sexual activity by your partner or ex-partner?: No    Family History: Family History  Problem Relation Age of Onset   Hypertension Mother    Hypertension Father    Colon cancer Neg Hx    Colon polyps Neg Hx    Inflammatory bowel disease Neg Hx     Current Medications:  Current Outpatient Medications:    amLODipine  (NORVASC ) 10 MG tablet, TAKE 1 TABLET BY MOUTH EVERY DAY, Disp: 90 tablet, Rfl: 1   cetirizine (ZYRTEC) 10 MG tablet, SMARTSIG:1 Tablet(s) By Mouth Every Evening, Disp: , Rfl:    fexofenadine  (ALLEGRA ) 180 MG tablet, Take 1 tablet (180 mg total) by mouth daily., Disp: 30 tablet, Rfl: 0   fluorouracil  CALGB 19297 2,400 mg/m2 in sodium chloride  0.9 % 150 mL, Inject 2,400 mg/m2 into the vein over 48 hr. Every 14 days, Disp: , Rfl:    FLUOROURACIL  IV, Inject into the vein every 14 (fourteen) days., Disp: , Rfl:    fluticasone  (FLONASE ) 50 MCG/ACT nasal spray, Place 2 sprays into both nostrils daily., Disp: 16 g, Rfl: 12   magnesium  oxide (MAG-OX) 400 (240 Mg) MG tablet, Take 1 tablet (400 mg total) by mouth 2 (two) times daily., Disp: 60 tablet, Rfl: 6   medroxyPROGESTERone (DEPO-PROVERA) 150 MG/ML injection, Inject 150 mg into the muscle every 3 (three) months., Disp: , Rfl:    Multiple Vitamin (MULTIVITAMIN) tablet, Take 1 tablet by mouth daily., Disp: , Rfl:    nebivolol  (BYSTOLIC ) 10 MG tablet, Take 10 mg by mouth daily., Disp: , Rfl:    OXALIPLATIN  IV, Inject into the vein every 14 (fourteen) days., Disp: , Rfl:    Panitumumab  (VECTIBIX  IV), Inject into the vein every 14 (fourteen) days., Disp: , Rfl:    pantoprazole  (PROTONIX ) 40  MG tablet, Take 40 mg by mouth daily as needed (acid reflux)., Disp: , Rfl:    potassium chloride  SA (KLOR-CON  M20) 20 MEQ tablet,  Take 2 tablets (40 mEq total) by mouth 2 (two) times daily., Disp: 360 tablet, Rfl: 3   prochlorperazine  (COMPAZINE ) 10 MG tablet, Take 1 tablet (10 mg total) by mouth every 6 (six) hours as needed for nausea or vomiting., Disp: 60 tablet, Rfl: 2   trifluridine -tipiracil  (LONSURF ) 20-8.19 MG tablet, Take 3 tablets (60 mg of trifluridine  total) by mouth 2 (two) times daily after a meal. Take within 1 hr after AM & PM meals on days 1-5, 8-12. Repeat every 28 days., Disp: 60 tablet, Rfl: 2 No current facility-administered medications for this visit.  Facility-Administered Medications Ordered in Other Visits:    0.9 %  sodium chloride  infusion, , Intravenous, Continuous, Rogers Hai, MD, Stopped at 08/14/23 1123   Allergies: Allergies  Allergen Reactions   Zithromax  [Azithromycin ] Hives    infusing arm began to swell , her face got red, and she broke out, there are bumps around where the IV site was infusing   Motrin [Ibuprofen] Itching   Sudafed [Pseudoephedrine  Hcl] Other (See Comments)    Hypertension   Zestril [Lisinopril] Cough    REVIEW OF SYSTEMS:   Review of Systems  Constitutional:  Negative for chills, fatigue and fever.  HENT:   Negative for lump/mass, mouth sores, nosebleeds, sore throat and trouble swallowing.   Eyes:  Negative for eye problems.  Respiratory:  Negative for cough and shortness of breath.   Cardiovascular:  Negative for chest pain, leg swelling and palpitations.  Gastrointestinal:  Negative for abdominal pain, constipation, diarrhea, nausea and vomiting.  Genitourinary:  Negative for bladder incontinence, difficulty urinating, dysuria, frequency, hematuria and nocturia.   Musculoskeletal:  Negative for arthralgias, back pain, flank pain, myalgias and neck pain.  Skin:  Negative for itching and rash.  Neurological:  Positive for numbness. Negative for dizziness and headaches.  Hematological:  Does not bruise/bleed easily.  Psychiatric/Behavioral:   Negative for depression, sleep disturbance and suicidal ideas. The patient is not nervous/anxious.   All other systems reviewed and are negative.    VITALS:   There were no vitals taken for this visit.  Wt Readings from Last 3 Encounters:  08/14/23 169 lb 14.4 oz (77.1 kg)  07/31/23 169 lb 12.1 oz (77 kg)  07/17/23 172 lb 9.9 oz (78.3 kg)    There is no height or weight on file to calculate BMI.  Performance status (ECOG): 1 - Symptomatic but completely ambulatory  PHYSICAL EXAM:   Physical Exam Vitals and nursing note reviewed. Exam conducted with a chaperone present.  Constitutional:      Appearance: Normal appearance.  Cardiovascular:     Rate and Rhythm: Normal rate and regular rhythm.     Pulses: Normal pulses.     Heart sounds: Normal heart sounds.  Pulmonary:     Effort: Pulmonary effort is normal.     Breath sounds: Normal breath sounds.  Abdominal:     Palpations: Abdomen is soft. There is no hepatomegaly, splenomegaly or mass.     Tenderness: There is no abdominal tenderness.  Musculoskeletal:     Right lower leg: No edema.     Left lower leg: No edema.  Lymphadenopathy:     Cervical: No cervical adenopathy.     Right cervical: No superficial, deep or posterior cervical adenopathy.    Left cervical: No superficial, deep or posterior cervical  adenopathy.     Upper Body:     Right upper body: No supraclavicular or axillary adenopathy.     Left upper body: No supraclavicular or axillary adenopathy.  Neurological:     General: No focal deficit present.     Mental Status: She is alert and oriented to person, place, and time.  Psychiatric:        Mood and Affect: Mood normal.        Behavior: Behavior normal.     LABS:   CBC     Component Value Date/Time   WBC 3.4 (L) 08/14/2023 0927   RBC 3.19 (L) 08/14/2023 0927   HGB 10.8 (L) 08/14/2023 0927   HCT 30.9 (L) 08/14/2023 0927   PLT 210 08/14/2023 0927   MCV 96.9 08/14/2023 0927   MCH 33.9 08/14/2023  0927   MCHC 35.0 08/14/2023 0927   RDW 15.1 08/14/2023 0927   LYMPHSABS 1.7 08/14/2023 0927   MONOABS 0.4 08/14/2023 0927   EOSABS 0.0 08/14/2023 0927   BASOSABS 0.0 08/14/2023 0927    CMP      Component Value Date/Time   NA 136 08/14/2023 0927   K 3.6 08/14/2023 0927   CL 107 08/14/2023 0927   CO2 19 (L) 08/14/2023 0927   GLUCOSE 108 (H) 08/14/2023 0927   BUN 10 08/14/2023 0927   CREATININE 0.71 08/14/2023 0927   CALCIUM  8.7 (L) 08/14/2023 0927   PROT 7.0 08/14/2023 0927   ALBUMIN 3.4 (L) 08/14/2023 0927   AST 17 08/14/2023 0927   ALT 9 08/14/2023 0927   ALKPHOS 59 08/14/2023 0927   BILITOT 0.6 08/14/2023 0927   GFRNONAA >60 08/14/2023 0927   GFRAA >60 10/26/2015 1317     Lab Results  Component Value Date   CEA1 231.0 (H) 07/31/2023   /  CEA  Date Value Ref Range Status  07/31/2023 231.0 (H) 0.0 - 4.7 ng/mL Final    Comment:    (NOTE)                             Nonsmokers          <3.9                             Smokers             <5.6 [employer] Electrochemiluminescence Immunoassay (ECLIA) Values obtained with different assay methods or kits cannot be used interchangeably.  Results cannot be interpreted as absolute evidence of the presence or absence of malignant disease. Performed At: [facility] [address] [place], [place] [id number] Jennette Shorter MD Ey:[id number]    No results found for: PSA1 No results found for: CAN199 No results found for: CAN125  No results found for: TOTALPROTELP, ALBUMINELP, A1GS, A2GS, BETS, BETA2SER, GAMS, MSPIKE, SPEI Lab Results  Component Value Date   TIBC 256 01/11/2021   FERRITIN 108 01/11/2021   IRONPCTSAT 4 (L) 01/11/2021   No results found for: LDH   STUDIES:   No results found.

## 2023-08-14 NOTE — Progress Notes (Signed)
 Patient is taking Lonsurf  as prescribed. She has not missed any doses and reports no side effects at this time.    Patient has been examined by Dr. Rogers. Vital signs and labs have been reviewed by MD - ANC, Creatinine, LFTs, hemoglobin, and platelets are within treatment parameters per M.D. - pt may proceed with treatment.  Primary RN and pharmacy notified.

## 2023-08-14 NOTE — Patient Instructions (Signed)

## 2023-08-14 NOTE — Progress Notes (Signed)
 Patients port flushed without difficulty.  Good blood return noted with no bruising or swelling noted at site.  Patient remains accessed for treatment.

## 2023-08-14 NOTE — Progress Notes (Signed)
 Patient tolerated chemotherapy with no complaints voiced.  Side effects with management reviewed with understanding verbalized.  Port site clean and dry with no bruising or swelling noted at site.  Good blood return noted before and after administration of chemotherapy.  Band aid applied.  Patient left in satisfactory condition with VSS and no s/s of distress noted. All follow ups as scheduled.   Venkat Ankney [employer]

## 2023-08-26 ENCOUNTER — Other Ambulatory Visit: Payer: Self-pay

## 2023-08-28 ENCOUNTER — Inpatient Hospital Stay

## 2023-08-28 ENCOUNTER — Encounter: Payer: Self-pay | Admitting: Hematology

## 2023-08-28 VITALS — BP 123/74 | HR 78 | Temp 97.8°F | Resp 18 | Wt 169.6 lb

## 2023-08-28 DIAGNOSIS — C189 Malignant neoplasm of colon, unspecified: Secondary | ICD-10-CM

## 2023-08-28 DIAGNOSIS — Z95828 Presence of other vascular implants and grafts: Secondary | ICD-10-CM

## 2023-08-28 DIAGNOSIS — Z5112 Encounter for antineoplastic immunotherapy: Secondary | ICD-10-CM | POA: Diagnosis not present

## 2023-08-28 LAB — CBC WITH DIFFERENTIAL/PLATELET
Abs Immature Granulocytes: 0 K/uL (ref 0.00–0.07)
Basophils Absolute: 0 K/uL (ref 0.0–0.1)
Basophils Relative: 0 %
Eosinophils Absolute: 0 K/uL (ref 0.0–0.5)
Eosinophils Relative: 0 %
HCT: 27.6 % — ABNORMAL LOW (ref 36.0–46.0)
Hemoglobin: 9.6 g/dL — ABNORMAL LOW (ref 12.0–15.0)
Lymphocytes Relative: 46 %
Lymphs Abs: 1.3 K/uL (ref 0.7–4.0)
MCH: 33 pg (ref 26.0–34.0)
MCHC: 34.8 g/dL (ref 30.0–36.0)
MCV: 94.8 fL (ref 80.0–100.0)
Monocytes Absolute: 0.1 K/uL (ref 0.1–1.0)
Monocytes Relative: 5 %
Neutro Abs: 1.4 K/uL — ABNORMAL LOW (ref 1.7–7.7)
Neutrophils Relative %: 49 %
Platelets: 158 K/uL (ref 150–400)
RBC: 2.91 MIL/uL — ABNORMAL LOW (ref 3.87–5.11)
RDW: 14.4 % (ref 11.5–15.5)
WBC: 2.8 K/uL — ABNORMAL LOW (ref 4.0–10.5)
nRBC: 0 % (ref 0.0–0.2)

## 2023-08-28 MED ORDER — SODIUM CHLORIDE 0.9% FLUSH
10.0000 mL | INTRAVENOUS | Status: AC
  Administered 2023-08-28: 10 mL via INTRAVENOUS

## 2023-08-28 MED ORDER — HEPARIN SOD (PORK) LOCK FLUSH 100 UNIT/ML IV SOLN
500.0000 [IU] | Freq: Once | INTRAVENOUS | Status: AC | PRN
Start: 2023-08-28 — End: 2023-08-28
  Administered 2023-08-28: 500 [IU]

## 2023-08-28 MED ORDER — SODIUM CHLORIDE 0.9 % IV SOLN: INTRAVENOUS | Status: DC

## 2023-08-28 MED ORDER — SODIUM CHLORIDE 0.9% FLUSH
10.0000 mL | INTRAVENOUS | Status: DC | PRN
  Administered 2023-08-28: 10 mL

## 2023-08-28 MED ORDER — SODIUM CHLORIDE 0.9 % IV SOLN
5.0000 mg/kg | Freq: Once | INTRAVENOUS | Status: AC
  Administered 2023-08-28: 400 mg via INTRAVENOUS
  Filled 2023-08-28: qty 16

## 2023-08-28 NOTE — Patient Instructions (Signed)
 [facility] [place] - A DEPT OF [facility]  Discharge Instructions: Thank you for choosing  Cancer Center to provide your oncology and hematology care.  If you have a lab appointment with the Cancer Center - please note that after April 8th, 2024, all labs will be drawn in the cancer center.  You do not have to check in or register with the main entrance as you have in the past but will complete your check-in in the cancer center.  Wear comfortable clothing and clothing appropriate for easy access to any Portacath or PICC line.   We strive to give you quality time with your provider. You may need to reschedule your appointment if you arrive late (15 or more minutes).  Arriving late affects you and other patients whose appointments are after yours.  Also, if you miss three or more appointments without notifying the office, you may be dismissed from the clinic at the provider's discretion.      For prescription refill requests, have your pharmacy contact our office and allow 72 hours for refills to be completed.    Today you received the following chemotherapy and/or immunotherapy agents Avastin   To help prevent nausea and vomiting after your treatment, we encourage you to take your nausea medication as directed.  Bevacizumab Injection What is this medication? BEVACIZUMAB (be va SIZ yoo mab) treats some types of cancer. It works by blocking a protein that causes cancer cells to grow and multiply. This helps to slow or stop the spread of cancer cells. It is a monoclonal antibody. This medicine may be used for other purposes; ask your health care provider or pharmacist if you have questions. COMMON BRAND NAME(S): Alymsys, Avastin, MVASI, Rosaland Lao What should I tell my care team before I take this medication? They need to know if you have any of these conditions: Blood clots Coughing up blood Having or recent surgery Heart failure High blood  pressure History of a connection between 2 or more body parts that do not usually connect (fistula) History of a tear in your stomach or intestines Protein in your urine An unusual or allergic reaction to bevacizumab, other medications, foods, dyes, or preservatives Pregnant or trying to get pregnant Breast-feeding How should I use this medication? This medication is injected into a vein. It is given by your care team in a hospital or clinic setting. Talk to your care team the use of this medication in children. Special care may be needed. Overdosage: If you think you have taken too much of this medicine contact a poison control center or emergency room at once. NOTE: This medicine is only for you. Do not share this medicine with others. What if I miss a dose? Keep appointments for follow-up doses. It is important not to miss your dose. Call your care team if you are unable to keep an appointment. What may interact with this medication? Interactions are not expected. This list may not describe all possible interactions. Give your health care provider a list of all the medicines, herbs, non-prescription drugs, or dietary supplements you use. Also tell them if you smoke, drink alcohol, or use illegal drugs. Some items may interact with your medicine. What should I watch for while using this medication? Your condition will be monitored carefully while you are receiving this medication. You may need blood work while taking this medication. This medication may make you feel generally unwell. This is not uncommon as chemotherapy can  affect healthy cells as well as cancer cells. Report any side effects. Continue your course of treatment even though you feel ill unless your care team tells you to stop. This medication may increase your risk to bruise or bleed. Call your care team if you notice any unusual bleeding. Before having surgery, talk to your care team to make sure it is ok. This medication can  increase the risk of poor healing of your surgical site or wound. You will need to stop this medication for 28 days before surgery. After surgery, wait at least 28 days before restarting this medication. Make sure the surgical site or wound is healed enough before restarting this medication. Talk to your care team if questions. Talk to your care team if you may be pregnant. Serious birth defects can occur if you take this medication during pregnancy and for 6 months after the last dose. Contraception is recommended while taking this medication and for 6 months after the last dose. Your care team can help you find the option that works for you. Do not breastfeed while taking this medication and for 6 months after the last dose. This medication can cause infertility. Talk to your care team if you are concerned about your fertility. What side effects may I notice from receiving this medication? Side effects that you should report to your care team as soon as possible: Allergic reactions--skin rash, itching, hives, swelling of the face, lips, tongue, or throat Bleeding--bloody or black, tar-like stools, vomiting blood or brown material that looks like coffee grounds, red or dark brown urine, small red or purple spots on skin, unusual bruising or bleeding Blood clot--pain, swelling, or warmth in the leg, shortness of breath, chest pain Heart attack--pain or tightness in the chest, shoulders, arms, or jaw, nausea, shortness of breath, cold or clammy skin, feeling faint or lightheaded Heart failure--shortness of breath, swelling of the ankles, feet, or hands, sudden weight gain, unusual weakness or fatigue Increase in blood pressure Infection--fever, chills, cough, sore throat, wounds that don't heal, pain or trouble when passing urine, general feeling of discomfort or being unwell Infusion reactions--chest pain, shortness of breath or trouble breathing, feeling faint or lightheaded Kidney injury--decrease in  the amount of urine, swelling of the ankles, hands, or feet Stomach pain that is severe, does not go away, or gets worse Stroke--sudden numbness or weakness of the face, arm, or leg, trouble speaking, confusion, trouble walking, loss of balance or coordination, dizziness, severe headache, change in vision Sudden and severe headache, confusion, change in vision, seizures, which may be signs of posterior reversible encephalopathy syndrome (PRES) Side effects that usually do not require medical attention (report to your care team if they continue or are bothersome): Back pain Change in taste Diarrhea Dry skin Increased tears Nosebleed This list may not describe all possible side effects. Call your doctor for medical advice about side effects. You may report side effects to FDA at 1-800-FDA-1088. Where should I keep my medication? This medication is given in a hospital or clinic. It will not be stored at home. NOTE: This sheet is a summary. It may not cover all possible information. If you have questions about this medicine, talk to your doctor, pharmacist, or health care provider.  2024 Elsevier/Gold Standard (2021-06-11 00:00:00)  BELOW ARE SYMPTOMS THAT SHOULD BE REPORTED IMMEDIATELY: *FEVER GREATER THAN 100.4 F (38 C) OR HIGHER *CHILLS OR SWEATING *NAUSEA AND VOMITING THAT IS NOT CONTROLLED WITH YOUR NAUSEA MEDICATION *UNUSUAL SHORTNESS OF BREATH *UNUSUAL BRUISING  OR BLEEDING *URINARY PROBLEMS (pain or burning when urinating, or frequent urination) *BOWEL PROBLEMS (unusual diarrhea, constipation, pain near the anus) TENDERNESS IN MOUTH AND THROAT WITH OR WITHOUT PRESENCE OF ULCERS (sore throat, sores in mouth, or a toothache) UNUSUAL RASH, SWELLING OR PAIN  UNUSUAL VAGINAL DISCHARGE OR ITCHING   Items with * indicate a potential emergency and should be followed up as soon as possible or go to the Emergency Department if any problems should occur.  Please show the CHEMOTHERAPY ALERT  CARD or IMMUNOTHERAPY ALERT CARD at check-in to the Emergency Department and triage nurse.  Should you have questions after your visit or need to cancel or reschedule your appointment, please contact [facility] [place] - A DEPT OF [facility] [phone]  and follow the prompts.  Office hours are 8:00 a.m. to 4:30 p.m. Monday - Friday. Please note that voicemails left after 4:00 p.m. may not be returned until the following business day.  We are closed weekends and major holidays. You have access to a nurse at all times for urgent questions. Please call the main number to the clinic [phone] and follow the prompts.  For any non-urgent questions, you may also contact your provider using MyChart. We now offer e-Visits for anyone 55 and older to request care online for non-urgent symptoms. For details visit [email or web address].   Also download the MyChart app! Go to the app store, search "MyChart", open the app, select [place], and log in with your MyChart username and password.

## 2023-08-28 NOTE — Progress Notes (Signed)
 Patient presents today for Vegzelma  infusion.  Patient is in satisfactory condition with no new complaints voiced.  Vital signs are stable.  Labs reviewed and all other labs are within treatment parameters.Patient's ANC noted to be 1.4. Dr.Katragadda made aware.  We will proceed with treatment per MD orders.    Treatment given today per MD orders. Tolerated infusion without adverse affects. Vital signs stable. No complaints at this time. Discharged from clinic ambulatory in stable condition. Alert and oriented x 3. F/U with [facility] as scheduled.

## 2023-08-29 LAB — CEA: CEA: 273 ng/mL — ABNORMAL HIGH (ref 0.0–4.7)

## 2023-08-30 ENCOUNTER — Other Ambulatory Visit: Payer: Self-pay | Admitting: Hematology

## 2023-08-30 ENCOUNTER — Other Ambulatory Visit: Payer: Self-pay

## 2023-08-30 DIAGNOSIS — C189 Malignant neoplasm of colon, unspecified: Secondary | ICD-10-CM

## 2023-08-30 MED ORDER — LONSURF 20-8.19 MG PO TABS
60.0000 mg | ORAL_TABLET | Freq: Two times a day (BID) | ORAL | 2 refills | Status: DC
Start: 2023-08-30 — End: 2023-11-22
  Filled 2023-08-30: qty 60, 28d supply, fill #0
  Filled 2023-09-26 – 2023-10-02 (×4): qty 60, 28d supply, fill #1
  Filled 2023-10-31: qty 60, 28d supply, fill #2

## 2023-08-30 NOTE — Progress Notes (Signed)
 Specialty Pharmacy Refill Coordination Note  Diana Fox is a 53 y.o. female contacted today regarding refills of specialty medication(s) Trifluridine -Tipiracil  (Lonsurf )   Patient requested Pickup at [facility] at [place] date: 09/06/23   Medication will be filled on 09/05/23.   This fill date is pending response to refill request from provider. Patient is aware and if they have not received fill by intended date they must follow up with pharmacy.

## 2023-08-31 ENCOUNTER — Other Ambulatory Visit: Payer: Self-pay

## 2023-09-04 ENCOUNTER — Encounter (HOSPITAL_COMMUNITY): Payer: Self-pay | Admitting: Hematology

## 2023-09-04 ENCOUNTER — Encounter: Payer: Self-pay | Admitting: Hematology

## 2023-09-04 ENCOUNTER — Ambulatory Visit (HOSPITAL_COMMUNITY)

## 2023-09-04 NOTE — Progress Notes (Signed)
 Erroneous encounter

## 2023-09-05 ENCOUNTER — Ambulatory Visit (HOSPITAL_COMMUNITY)
Admission: RE | Admit: 2023-09-05 | Discharge: 2023-09-05 | Disposition: A | Discharge status: Home / Self Care | Source: Ambulatory Visit | Attending: Hematology | Admitting: Hematology

## 2023-09-05 ENCOUNTER — Other Ambulatory Visit: Payer: Self-pay

## 2023-09-05 DIAGNOSIS — C189 Malignant neoplasm of colon, unspecified: Secondary | ICD-10-CM | POA: Insufficient documentation

## 2023-09-05 MED ORDER — IOHEXOL 9 MG/ML PO SOLN
500.0000 mL | ORAL | Status: AC
  Administered 2023-09-05 (×2): 500 mL via ORAL

## 2023-09-05 MED ORDER — IOHEXOL 300 MG/ML  SOLN
100.0000 mL | Freq: Once | INTRAMUSCULAR | Status: AC | PRN
  Administered 2023-09-05: 100 mL via INTRAVENOUS

## 2023-09-11 ENCOUNTER — Inpatient Hospital Stay: Attending: Hematology

## 2023-09-11 ENCOUNTER — Inpatient Hospital Stay

## 2023-09-11 ENCOUNTER — Inpatient Hospital Stay (HOSPITAL_BASED_OUTPATIENT_CLINIC_OR_DEPARTMENT_OTHER): Admitting: Hematology

## 2023-09-11 VITALS — BP 137/77 | HR 82 | Temp 98.3°F | Resp 18

## 2023-09-11 DIAGNOSIS — Z9221 Personal history of antineoplastic chemotherapy: Secondary | ICD-10-CM | POA: Diagnosis not present

## 2023-09-11 DIAGNOSIS — E876 Hypokalemia: Secondary | ICD-10-CM | POA: Insufficient documentation

## 2023-09-11 DIAGNOSIS — C189 Malignant neoplasm of colon, unspecified: Secondary | ICD-10-CM

## 2023-09-11 DIAGNOSIS — D649 Anemia, unspecified: Secondary | ICD-10-CM | POA: Insufficient documentation

## 2023-09-11 DIAGNOSIS — D72819 Decreased white blood cell count, unspecified: Secondary | ICD-10-CM | POA: Diagnosis not present

## 2023-09-11 DIAGNOSIS — M549 Dorsalgia, unspecified: Secondary | ICD-10-CM | POA: Diagnosis not present

## 2023-09-11 DIAGNOSIS — Z79899 Other long term (current) drug therapy: Secondary | ICD-10-CM | POA: Insufficient documentation

## 2023-09-11 DIAGNOSIS — Z809 Family history of malignant neoplasm, unspecified: Secondary | ICD-10-CM | POA: Diagnosis not present

## 2023-09-11 DIAGNOSIS — Z9049 Acquired absence of other specified parts of digestive tract: Secondary | ICD-10-CM | POA: Diagnosis not present

## 2023-09-11 DIAGNOSIS — I10 Essential (primary) hypertension: Secondary | ICD-10-CM | POA: Insufficient documentation

## 2023-09-11 DIAGNOSIS — Z793 Long term (current) use of hormonal contraceptives: Secondary | ICD-10-CM | POA: Insufficient documentation

## 2023-09-11 DIAGNOSIS — G629 Polyneuropathy, unspecified: Secondary | ICD-10-CM | POA: Diagnosis not present

## 2023-09-11 DIAGNOSIS — C187 Malignant neoplasm of sigmoid colon: Secondary | ICD-10-CM | POA: Insufficient documentation

## 2023-09-11 DIAGNOSIS — Z5112 Encounter for antineoplastic immunotherapy: Secondary | ICD-10-CM | POA: Diagnosis present

## 2023-09-11 DIAGNOSIS — Z95828 Presence of other vascular implants and grafts: Secondary | ICD-10-CM

## 2023-09-11 DIAGNOSIS — R1903 Right lower quadrant abdominal swelling, mass and lump: Secondary | ICD-10-CM | POA: Insufficient documentation

## 2023-09-11 DIAGNOSIS — R599 Enlarged lymph nodes, unspecified: Secondary | ICD-10-CM | POA: Diagnosis not present

## 2023-09-11 LAB — COMPREHENSIVE METABOLIC PANEL WITH GFR
ALT: 11 U/L (ref 0–44)
AST: 19 U/L (ref 15–41)
Albumin: 3.5 g/dL (ref 3.5–5.0)
Alkaline Phosphatase: 53 U/L (ref 38–126)
Anion gap: 7 (ref 5–15)
BUN: 10 mg/dL (ref 6–20)
CO2: 19 mmol/L — ABNORMAL LOW (ref 22–32)
Calcium: 8.6 mg/dL — ABNORMAL LOW (ref 8.9–10.3)
Chloride: 111 mmol/L (ref 98–111)
Creatinine, Ser: 0.69 mg/dL (ref 0.44–1.00)
GFR, Estimated: >60 mL/min (ref >60)
Glucose, Bld: 114 mg/dL — ABNORMAL HIGH (ref 70–99)
Potassium: 3.7 mmol/L (ref 3.5–5.1)
Sodium: 137 mmol/L (ref 135–145)
Total Bilirubin: 0.6 mg/dL (ref 0.0–1.2)
Total Protein: 7.4 g/dL (ref 6.5–8.1)

## 2023-09-11 LAB — CBC WITH DIFFERENTIAL/PLATELET
Abs Immature Granulocytes: 0.01 K/uL (ref 0.00–0.07)
Basophils Absolute: 0 K/uL (ref 0.0–0.1)
Basophils Relative: 0 %
Eosinophils Absolute: 0 K/uL (ref 0.0–0.5)
Eosinophils Relative: 0 %
HCT: 28.7 % — ABNORMAL LOW (ref 36.0–46.0)
Hemoglobin: 10 g/dL — ABNORMAL LOW (ref 12.0–15.0)
Immature Granulocytes: 0 %
Lymphocytes Relative: 60 %
Lymphs Abs: 2 K/uL (ref 0.7–4.0)
MCH: 34.1 pg — ABNORMAL HIGH (ref 26.0–34.0)
MCHC: 34.8 g/dL (ref 30.0–36.0)
MCV: 98 fL (ref 80.0–100.0)
Monocytes Absolute: 0.4 K/uL (ref 0.1–1.0)
Monocytes Relative: 10 %
Neutro Abs: 1 K/uL — ABNORMAL LOW (ref 1.7–7.7)
Neutrophils Relative %: 30 %
Platelets: 185 K/uL (ref 150–400)
RBC: 2.93 MIL/uL — ABNORMAL LOW (ref 3.87–5.11)
RDW: 16.8 % — ABNORMAL HIGH (ref 11.5–15.5)
WBC: 3.5 K/uL — ABNORMAL LOW (ref 4.0–10.5)
nRBC: 0 % (ref 0.0–0.2)

## 2023-09-11 MED ORDER — SODIUM CHLORIDE 0.9% FLUSH
10.0000 mL | INTRAVENOUS | Status: DC | PRN
  Administered 2023-09-11: 10 mL

## 2023-09-11 MED ORDER — SODIUM CHLORIDE 0.9 % IV SOLN
5.0000 mg/kg | Freq: Once | INTRAVENOUS | Status: AC
  Administered 2023-09-11: 400 mg via INTRAVENOUS
  Filled 2023-09-11: qty 16

## 2023-09-11 MED ORDER — HEPARIN SOD (PORK) LOCK FLUSH 100 UNIT/ML IV SOLN
500.0000 [IU] | Freq: Once | INTRAVENOUS | Status: DC | PRN
Start: 2023-09-11 — End: 2023-09-11

## 2023-09-11 MED ORDER — SODIUM CHLORIDE 0.9 % IV SOLN: INTRAVENOUS | Status: DC

## 2023-09-11 NOTE — Patient Instructions (Signed)
 [facility] [place] - A DEPT OF [facility]  Discharge Instructions: Thank you for choosing [facility] to provide your oncology and hematology care.  If you have a lab appointment with the Cancer Center - please note that after April 8th, 2024, all labs will be drawn in the cancer center.  You do not have to check in or register with the main entrance as you have in the past but will complete your check-in in the cancer center.  Wear comfortable clothing and clothing appropriate for easy access to any Portacath or PICC line.   We strive to give you quality time with your provider. You may need to reschedule your appointment if you arrive late (15 or more minutes).  Arriving late affects you and other patients whose appointments are after yours.  Also, if you miss three or more appointments without notifying the office, you may be dismissed from the clinic at the provider's discretion.      For prescription refill requests, have your pharmacy contact our office and allow 72 hours for refills to be completed.    Today you received the following chemotherapy and/or immunotherapy agents Vegzelma   To help prevent nausea and vomiting after your treatment, we encourage you to take your nausea medication as directed.  Bevacizumab Injection What is this medication? BEVACIZUMAB (be va SIZ yoo mab) treats some types of cancer. It works by blocking a protein that causes cancer cells to grow and multiply. This helps to slow or stop the spread of cancer cells. It is a monoclonal antibody. This medicine may be used for other purposes; ask your health care provider or pharmacist if you have questions. COMMON BRAND NAME(S): Alymsys, Avastin, MVASI, Rosaland Lao What should I tell my care team before I take this medication? They need to know if you have any of these conditions: Blood clots Coughing up blood Having or recent surgery Heart failure High blood  pressure History of a connection between 2 or more body parts that do not usually connect (fistula) History of a tear in your stomach or intestines Protein in your urine An unusual or allergic reaction to bevacizumab, other medications, foods, dyes, or preservatives Pregnant or trying to get pregnant Breast-feeding How should I use this medication? This medication is injected into a vein. It is given by your care team in a hospital or clinic setting. Talk to your care team the use of this medication in children. Special care may be needed. Overdosage: If you think you have taken too much of this medicine contact a poison control center or emergency room at once. NOTE: This medicine is only for you. Do not share this medicine with others. What if I miss a dose? Keep appointments for follow-up doses. It is important not to miss your dose. Call your care team if you are unable to keep an appointment. What may interact with this medication? Interactions are not expected. This list may not describe all possible interactions. Give your health care provider a list of all the medicines, herbs, non-prescription drugs, or dietary supplements you use. Also tell them if you smoke, drink alcohol, or use illegal drugs. Some items may interact with your medicine. What should I watch for while using this medication? Your condition will be monitored carefully while you are receiving this medication. You may need blood work while taking this medication. This medication may make you feel generally unwell. This is not uncommon as chemotherapy can  affect healthy cells as well as cancer cells. Report any side effects. Continue your course of treatment even though you feel ill unless your care team tells you to stop. This medication may increase your risk to bruise or bleed. Call your care team if you notice any unusual bleeding. Before having surgery, talk to your care team to make sure it is ok. This medication can  increase the risk of poor healing of your surgical site or wound. You will need to stop this medication for 28 days before surgery. After surgery, wait at least 28 days before restarting this medication. Make sure the surgical site or wound is healed enough before restarting this medication. Talk to your care team if questions. Talk to your care team if you may be pregnant. Serious birth defects can occur if you take this medication during pregnancy and for 6 months after the last dose. Contraception is recommended while taking this medication and for 6 months after the last dose. Your care team can help you find the option that works for you. Do not breastfeed while taking this medication and for 6 months after the last dose. This medication can cause infertility. Talk to your care team if you are concerned about your fertility. What side effects may I notice from receiving this medication? Side effects that you should report to your care team as soon as possible: Allergic reactions--skin rash, itching, hives, swelling of the face, lips, tongue, or throat Bleeding--bloody or black, tar-like stools, vomiting blood or brown material that looks like coffee grounds, red or dark brown urine, small red or purple spots on skin, unusual bruising or bleeding Blood clot--pain, swelling, or warmth in the leg, shortness of breath, chest pain Heart attack--pain or tightness in the chest, shoulders, arms, or jaw, nausea, shortness of breath, cold or clammy skin, feeling faint or lightheaded Heart failure--shortness of breath, swelling of the ankles, feet, or hands, sudden weight gain, unusual weakness or fatigue Increase in blood pressure Infection--fever, chills, cough, sore throat, wounds that don't heal, pain or trouble when passing urine, general feeling of discomfort or being unwell Infusion reactions--chest pain, shortness of breath or trouble breathing, feeling faint or lightheaded Kidney injury--decrease in  the amount of urine, swelling of the ankles, hands, or feet Stomach pain that is severe, does not go away, or gets worse Stroke--sudden numbness or weakness of the face, arm, or leg, trouble speaking, confusion, trouble walking, loss of balance or coordination, dizziness, severe headache, change in vision Sudden and severe headache, confusion, change in vision, seizures, which may be signs of posterior reversible encephalopathy syndrome (PRES) Side effects that usually do not require medical attention (report to your care team if they continue or are bothersome): Back pain Change in taste Diarrhea Dry skin Increased tears Nosebleed This list may not describe all possible side effects. Call your doctor for medical advice about side effects. You may report side effects to FDA at 1-800-FDA-1088. Where should I keep my medication? This medication is given in a hospital or clinic. It will not be stored at home. NOTE: This sheet is a summary. It may not cover all possible information. If you have questions about this medicine, talk to your doctor, pharmacist, or health care provider.  2024 Elsevier/Gold Standard (2021-06-11 00:00:00)    BELOW ARE SYMPTOMS THAT SHOULD BE REPORTED IMMEDIATELY: *FEVER GREATER THAN 100.4 F (38 C) OR HIGHER *CHILLS OR SWEATING *NAUSEA AND VOMITING THAT IS NOT CONTROLLED WITH YOUR NAUSEA MEDICATION *UNUSUAL SHORTNESS OF BREATH *  UNUSUAL BRUISING OR BLEEDING *URINARY PROBLEMS (pain or burning when urinating, or frequent urination) *BOWEL PROBLEMS (unusual diarrhea, constipation, pain near the anus) TENDERNESS IN MOUTH AND THROAT WITH OR WITHOUT PRESENCE OF ULCERS (sore throat, sores in mouth, or a toothache) UNUSUAL RASH, SWELLING OR PAIN  UNUSUAL VAGINAL DISCHARGE OR ITCHING   Items with * indicate a potential emergency and should be followed up as soon as possible or go to the Emergency Department if any problems should occur.  Please show the CHEMOTHERAPY  ALERT CARD or IMMUNOTHERAPY ALERT CARD at check-in to the Emergency Department and triage nurse.  Should you have questions after your visit or need to cancel or reschedule your appointment, please contact [facility] [place] - A DEPT OF [facility] [phone]  and follow the prompts.  Office hours are 8:00 a.m. to 4:30 p.m. Monday - Friday. Please note that voicemails left after 4:00 p.m. may not be returned until the following business day.  We are closed weekends and major holidays. You have access to a nurse at all times for urgent questions. Please call the main number to the clinic [phone] and follow the prompts.  For any non-urgent questions, you may also contact your provider using MyChart. We now offer e-Visits for anyone 17 and older to request care online for non-urgent symptoms. For details visit [email or web address].   Also download the MyChart app! Go to the app store, search "MyChart", open the app, select [place], and log in with your MyChart username and password.

## 2023-09-11 NOTE — Progress Notes (Signed)
 Patient presents today for Vegzelma  infusion. Patient is in satisfactory condition with no new complaints voiced.  Vital signs are stable.  Labs reviewed by Dr. Rogers during the office visit and all other labs are within treatment parameters. Patient's ANC noted to be 1.0 today. We will proceed with treatment per MD orders.   Treatment given today per MD orders. Tolerated infusion without adverse affects. Vital signs stable. No complaints at this time. Discharged from clinic ambulatory in stable condition. Alert and oriented x 3. F/U with [facility] as scheduled.

## 2023-09-11 NOTE — Progress Notes (Signed)
 [facility] [address], [place] [address]    Clinic Day:  09/11/23   Referring physician: Toribio Jerel MATSU, MD  Patient Care Team: Toribio Jerel MATSU, MD as PCP - General (Family Medicine) Rogers Hai, MD as Medical Oncologist (Medical Oncology) Celestia Joesph SQUIBB, RN as Oncology Nurse Navigator (Medical Oncology)   ASSESSMENT & PLAN:   Assessment: 1. 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  Biopsy consistent with moderately to poorly differentiated adenocarcinoma. - CTAP with contrast on 01/10/2021: Diffusely thickened, 10 cm segment of distal sigmoid colon with adjacent moderate inflammatory reaction, suspected 2 sites of small bowel fistulization to the diseased segment and a rim-enhancing low-density 2.5 x 1.8 cm stricture medial to the mid segment which could represent a necrotic lymph node or contained perforation/abscess.  There are bilateral mildly enlarged common iliac chain nodes.  Mildly dilated mid to lower abdominal small bowel segments.  Mildly prominent slightly steatotic liver. - Sigmoid colon resection, partial small bowel resection and Hartman's procedure on 03/26/2021: Moderately to poorly differentiated adenocarcinoma involving the subserosal adipose tissue and present at the margin.  9/9 lymph nodes positive for metastatic carcinoma.  Small bowel resection is positive for adenocarcinoma nodules in the serosal fat.  PT4PN2B.  MMR is preserved. - Operative report mentions obvious extension of the tumor outside the colon to the pelvic brim, unable to fully excise all of the tumor in the pelvis. - NGS testing (04/26/2021): KRAS/NRAS negative, BRAF negative, HER2 IHC (0) negative, MSI-stable - PET scan on 04/29/2021: Multifocal hypermetabolic  nodule, peritoneal and anterior abdominal wall adenopathy.  Findings worrisome for peritoneal carcinomatosis.  No evidence of hepatic metastatic disease or distant mets. - FOLFOXIRI and Vectibix  started on 05/17/2021, oxaliplatin  discontinued in October 2023.  FOLFIRI and Vectibix  continued until 12/20/2022 with progression. - We will see how she responds and will refer to Dr. Debora for resection/HIPEC therapy.  Colonoscopy through stoma and rectal stump on 10/04/2021 was negative. - FOLFOX and bevacizumab  started on 01/18/2023 and discontinued after 05/31/2023 for progression. - Lonsurf  and bevacizumab  started  cycle 1 on 06/19/2023 - Guardant360 (06/16/2023): GNAS, T p53, APC, MSI high not detected, TMB-low.   2. Social/family history: - She lives at home with her family.  She works as a Lawyer at [employer].  Non-smoker. - Paternal cousin had cancer.  Maternal aunt had cancer.    Plan: 1. Stage IV sigmoid colon adenocarcinoma, MSI-stable, RAS/BRAF negative: - She has completed 3 cycles of Lonsurf  and bevacizumab . - She did not experience any GI side effects.  No bleeding issues reported. - Labs today: Normal LFTs.  CBC with mild leukopenia and ANC of 1.0.  Last CEA was 273.  CEA from today is pending.  Last UA shows protein of 30. - CT CAP (09/05/2023): Overall stable disease with slight improvement in peritoneal nodularity along the left pericolic gutter.  No significant change in the large complex cystic mass in the right lower quadrant. - Recommend continuing Lonsurf  3 tablets twice daily on days 1-5 and 8-12 every 28 days and bevacizumab  5 mg/kg every 2 weeks.  She will proceed with cycle 4 today.  RTC 4 weeks for follow-up.   2.  Hypokalemia: - Continue potassium 40 mill equivalents twice daily.  Potassium is normal.  3.  Peripheral neuropathy: -  Numbness in the feet, left more than right is stable.  Numbness in the hands is also stable on and off.  4.  Hypertension: - Continue  Norvasc  and Bystolic  daily.  Blood pressure today is 140/85.    Orders Placed This Encounter  Procedures   CEA    Standing Status:   Future    Expected Date:   09/11/2023    Expiration Date:   09/10/2024    Release to patient:   Immediate [1]    Remote health to draw?:   No   CBC with Differential    Standing Status:   Future    Expected Date:   10/09/2023    Expiration Date:   01/07/2024   Comprehensive metabolic panel    Standing Status:   Future    Expected Date:   10/09/2023    Expiration Date:   01/07/2024   Magnesium     Standing Status:   Future    Expected Date:   10/09/2023    Expiration Date:   01/07/2024   CEA    Standing Status:   Future    Expected Date:   10/09/2023    Expiration Date:   01/07/2024      [name],acting as a scribe for Alean Stands, MD.,have documented all relevant documentation on the behalf of Alean Stands, MD,as directed by  Alean Stands, MD while in the presence of Alean Stands, MD.  I, Alean Stands MD, have reviewed the above documentation for accuracy and completeness, and I agree with the above.      Alean Stands, MD   8/4/20251:31 PM  CHIEF COMPLAINT:   Diagnosis: stage IV sigmoid colon adenocarcinoma    Cancer Staging  Colon carcinoma [facility]) Staging form: Colon and Rectum, AJCC 8th Edition - Clinical stage from 04/06/2021: Stage IVC (cT4a, cN2b, pM1c) - Signed by Stands Alean, MD on 05/04/2021    Prior Therapy: 1. FOLFOXIRI and Vectibix , 05/17/21 - 11/2021 2. FOLFIRI and Vectibix , 11/2021 - 12/20/22 3.  FOLFOX and bevacizumab   Current Therapy: Lonsurf  and bevacizumab    HISTORY OF PRESENT ILLNESS:   Oncology History  Colon carcinoma (HCC)  03/26/2021 Initial Diagnosis   Colon carcinoma (HCC)   04/06/2021 Cancer Staging   Staging form: Colon and Rectum, AJCC 8th Edition - Clinical stage from 04/06/2021: Stage IVC (cT4a, cN2b, pM1c) - Signed by Stands Alean, MD on  05/04/2021 Histopathologic type: Adenocarcinoma, NOS Stage prefix: Initial diagnosis Total positive nodes: 9 Total nodes examined: 9 Tumor deposits (TD): Present Carcinoembryonic antigen (CEA) (ng/mL): 61.5 Perineural invasion (PNI): Present Microsatellite instability (MSI): Stable KRAS mutation: Negative NRAS mutation: Negative BRAF mutation: Negative   05/17/2021 - 10/08/2021 Chemotherapy   Patient is on Treatment Plan : COLORECTAL FOLFOXIRI + Panitumumab  q14d     05/17/2021 - 12/22/2022 Chemotherapy   Patient is on Treatment Plan : COLORECTAL FOLFIRI + VECTIBIX  q14d     01/18/2023 - 06/02/2023 Chemotherapy   Patient is on Treatment Plan : COLORECTAL FOLFOX + Bevacizumab  q14d     06/14/2023 -  Chemotherapy   Patient is on Treatment Plan : COLORECTAL Bevacizumab  + Trifluridine /Tipiracil  q28d        INTERVAL HISTORY:   Diana Fox is a 53 y.o. female presenting to clinic today for follow up of stage IV sigmoid colon adenocarcinoma. She was last seen by me on 08/14/2023.  Since her last visit, she underwent CT CAO on 09/05/2023 that found: No evidence of thoracic metastasis. Stable soft tissue nodularity in the sigmoid mesocolon. Slight  improvement in peritoneal nodularity along the LEFT pericolic gutter. No significant change in large complex cystic mass in the RIGHT lower quadrant presumably originating from the RIGHT ovary. LEFT colostomy without complication. Stable leiomyomatous uterus.  Today, she states that she is doing well overall. Her appetite level is at 100%. Her energy level is at 100%.  PAST MEDICAL HISTORY:   Past Medical History: Past Medical History:  Diagnosis Date   Anemia    Back pain    Hypertension    Port-A-Cath in place 05/10/2021    Surgical History: Past Surgical History:  Procedure Laterality Date   BIOPSY  03/02/2021   Procedure: BIOPSY;  Surgeon: Cindie Carlin POUR, DO;  [facility];  Service: Endoscopy;;   BOWEL RESECTION N/A 03/26/2021    Procedure: SMALL BOWEL RESECTION;  Surgeon: Mavis Anes, MD;  Location: [facility];  Service: General;  Laterality: N/A;   CHOLECYSTECTOMY     COLONOSCOPY WITH PROPOFOL  N/A 10/04/2021   Procedure: COLONOSCOPY WITH PROPOFOL ;  Surgeon: Cindie Carlin POUR, DO;  [facility]: [facility];  Service: Endoscopy;  Laterality: N/A;  12:15PM, VIA OSTOMY   COLOSTOMY N/A 03/26/2021   Procedure: COLOSTOMY;  Surgeon: Mavis Anes, MD;  Location: [facility];  Service: General;  Laterality: N/A;   ESOPHAGOGASTRODUODENOSCOPY (EGD) WITH PROPOFOL  N/A 03/02/2021   Procedure: ESOPHAGOGASTRODUODENOSCOPY (EGD) WITH PROPOFOL ;  Surgeon: Cindie Carlin POUR, DO;  [facility];  Service: Endoscopy;  Laterality: N/A;   PARTIAL COLECTOMY N/A 03/26/2021   Procedure: PARTIAL COLECTOMY;  Surgeon: Mavis Anes, MD;  Location: [facility];  Service: General;  Laterality: N/A;   PORTACATH PLACEMENT Left 04/14/2021   Procedure: INSERTION PORT-A-CATH;  Surgeon: Mavis Anes, MD;  Location: [facility];  Service: General;  Laterality: Left;   SCLEROTHERAPY  03/02/2021   Procedure: MATIAS;  Surgeon: Cindie Carlin POUR, DO;  [facility];  Service: Endoscopy;;   SIGMOIDOSCOPY  03/02/2021   Procedure: KINGSTON;  Surgeon: Cindie Carlin POUR, DO;  [facility];  Service: Endoscopy;;    Social History: Social History   Socioeconomic History   Marital status: Married    Spouse name: Not on file   Number of children: Not on file   Years of education: Not on file   Highest education level: Not on file  Occupational History   Not on file  Tobacco Use   Smoking status: Never   Smokeless tobacco: Never  Vaping Use   Vaping status: Never Used  Substance and Sexual Activity   Alcohol use: No   Drug use: No   Sexual activity: Not on file  Other Topics Concern   Not on file  Social History Narrative   Not on file   Social Drivers of Health   Financial Resource Strain: Medium Risk (06/20/2023)   Received from  [facility]   Overall Financial Resource Strain (CARDIA)    Difficulty of Paying Living Expenses: Somewhat hard  Food Insecurity: Food Insecurity Present (06/20/2023)   Received from [facility]   Hunger Vital Sign    Within the past 12 months, you worried that your food would run out before you got the money to buy more.: Sometimes true    Within the past 12 months, the food you bought just didn't last and you didn't have money to get more.: Sometimes true  Transportation Needs: No Transportation Needs (06/20/2023)   Received from [facility] - Transportation    Lack of Transportation (Medical):  No    Lack of Transportation (Non-Medical): No  Physical Activity: Inactive (06/20/2023)   Received from [facility]   Exercise Vital Sign    On average, how many days per week do you engage in moderate to strenuous exercise (like a brisk walk)?: 0 days    On average, how many minutes do you engage in exercise at this level?: 0 min  Stress: No Stress Concern Present (06/20/2023)   Received from [facility] - Occupational Stress Questionnaire    Feeling of Stress : Not at all  Social Connections: Moderately Isolated (06/20/2023)   Received from [facility]   Social Connection and Isolation Panel    In a typical week, how many times do you talk on the phone with family, friends, or neighbors?: More than three times a week    How often do you get together with friends or relatives?: Once a week    How often do you attend church or religious services?: More than 4 times per year    Do you belong to any clubs or organizations such as church groups, unions, fraternal or athletic groups, or school groups?: No    How often do you attend meetings of the clubs or organizations you belong to?: Never    Are you married, widowed, divorced, separated, never married, or living with a partner?: Divorced  Intimate Partner Violence: Not At  Risk (06/20/2023)   Received from [facility]   Humiliation, Afraid, Rape, and Kick questionnaire    Within the last year, have you been afraid of your partner or ex-partner?: No    Within the last year, have you been humiliated or emotionally abused in other ways by your partner or ex-partner?: No    Within the last year, have you been kicked, hit, slapped, or otherwise physically hurt by your partner or ex-partner?: No    Within the last year, have you been raped or forced to have any kind of sexual activity by your partner or ex-partner?: No    Family History: Family History  Problem Relation Age of Onset   Hypertension Mother    Hypertension Father    Colon cancer Neg Hx    Colon polyps Neg Hx    Inflammatory bowel disease Neg Hx     Current Medications:  Current Outpatient Medications:    amLODipine  (NORVASC ) 10 MG tablet, TAKE 1 TABLET BY MOUTH EVERY DAY, Disp: 90 tablet, Rfl: 1   cetirizine (ZYRTEC) 10 MG tablet, SMARTSIG:1 Tablet(s) By Mouth Every Evening, Disp: , Rfl:    fexofenadine  (ALLEGRA ) 180 MG tablet, Take 1 tablet (180 mg total) by mouth daily., Disp: 30 tablet, Rfl: 0   fluorouracil  CALGB 19297 2,400 mg/m2 in sodium chloride  0.9 % 150 mL, Inject 2,400 mg/m2 into the vein over 48 hr. Every 14 days, Disp: , Rfl:    FLUOROURACIL  IV, Inject into the vein every 14 (fourteen) days., Disp: , Rfl:    fluticasone  (FLONASE ) 50 MCG/ACT nasal spray, Place 2 sprays into both nostrils daily., Disp: 16 g, Rfl: 12   magnesium  oxide (MAG-OX) 400 (240 Mg) MG tablet, Take 1 tablet (400 mg total) by mouth 2 (two) times daily., Disp: 60 tablet, Rfl: 6   medroxyPROGESTERone (DEPO-PROVERA) 150 MG/ML injection, Inject 150 mg into the muscle every 3 (three) months., Disp: , Rfl:    Multiple Vitamin (MULTIVITAMIN) tablet, Take 1 tablet by mouth daily., Disp: , Rfl:  nebivolol  (BYSTOLIC ) 10 MG tablet, Take 10 mg by mouth daily., Disp: , Rfl:    OXALIPLATIN  IV, Inject into the vein every  14 (fourteen) days., Disp: , Rfl:    Panitumumab  (VECTIBIX  IV), Inject into the vein every 14 (fourteen) days., Disp: , Rfl:    pantoprazole  (PROTONIX ) 40 MG tablet, Take 40 mg by mouth daily as needed (acid reflux)., Disp: , Rfl:    potassium chloride  SA (KLOR-CON  M20) 20 MEQ tablet, Take 2 tablets (40 mEq total) by mouth 2 (two) times daily., Disp: 360 tablet, Rfl: 3   prochlorperazine  (COMPAZINE ) 10 MG tablet, Take 1 tablet (10 mg total) by mouth every 6 (six) hours as needed for nausea or vomiting., Disp: 60 tablet, Rfl: 2   trifluridine -tipiracil  (LONSURF ) 20-8.19 MG tablet, Take 3 tablets (60 mg of trifluridine  total) by mouth 2 (two) times daily after a meal. Take within 1 hr after AM & PM meals on days 1-5, 8-12. Repeat every 28 days., Disp: 60 tablet, Rfl: 2 No current facility-administered medications for this visit.  Facility-Administered Medications Ordered in Other Visits:    0.9 %  sodium chloride  infusion, , Intravenous, Continuous, Eirene Rather, MD, Stopped at 09/11/23 1108   heparin  lock flush 100 unit/mL, 500 Units, Intracatheter, Once PRN, Hezekiah Veltre, MD   sodium chloride  flush (NS) 0.9 % injection 10 mL, 10 mL, Intracatheter, PRN, Espiridion Supinski, MD, 10 mL at 09/11/23 1109   Allergies: Allergies  Allergen Reactions   Zithromax  [Azithromycin ] Hives    infusing arm began to swell , her face got red, and she broke out, there are bumps around where the IV site was infusing   Motrin [Ibuprofen] Itching   Sudafed [Pseudoephedrine  Hcl] Other (See Comments)    Hypertension   Zestril [Lisinopril] Cough    REVIEW OF SYSTEMS:   Review of Systems  Constitutional:  Negative for chills, fatigue and fever.  HENT:   Negative for lump/mass, mouth sores, nosebleeds, sore throat and trouble swallowing.   Eyes:  Negative for eye problems.  Respiratory:  Negative for cough and shortness of breath.   Cardiovascular:  Negative for chest pain, leg swelling and  palpitations.  Gastrointestinal:  Negative for abdominal pain, constipation, diarrhea, nausea and vomiting.  Genitourinary:  Negative for bladder incontinence, difficulty urinating, dysuria, frequency, hematuria and nocturia.   Musculoskeletal:  Negative for arthralgias, back pain, flank pain, myalgias and neck pain.  Skin:  Negative for itching and rash.  Neurological:  Positive for numbness. Negative for dizziness and headaches.  Hematological:  Does not bruise/bleed easily.  Psychiatric/Behavioral:  Negative for depression, sleep disturbance and suicidal ideas. The patient is not nervous/anxious.   All other systems reviewed and are negative.    VITALS:   There were no vitals taken for this visit.  Wt Readings from Last 3 Encounters:  09/11/23 168 lb (76.2 kg)  08/28/23 169 lb 9.6 oz (76.9 kg)  08/14/23 169 lb 14.4 oz (77.1 kg)    There is no height or weight on file to calculate BMI.  Performance status (ECOG): 1 - Symptomatic but completely ambulatory  PHYSICAL EXAM:   Physical Exam Vitals and nursing note reviewed. Exam conducted with a chaperone present.  Constitutional:      Appearance: Normal appearance.  Cardiovascular:     Rate and Rhythm: Normal rate and regular rhythm.     Pulses: Normal pulses.     Heart sounds: Normal heart sounds.  Pulmonary:     Effort: Pulmonary effort is normal.  Breath sounds: Normal breath sounds.  Abdominal:     Palpations: Abdomen is soft. There is no hepatomegaly, splenomegaly or mass.     Tenderness: There is no abdominal tenderness.  Musculoskeletal:     Right lower leg: No edema.     Left lower leg: No edema.  Lymphadenopathy:     Cervical: No cervical adenopathy.     Right cervical: No superficial, deep or posterior cervical adenopathy.    Left cervical: No superficial, deep or posterior cervical adenopathy.     Upper Body:     Right upper body: No supraclavicular or axillary adenopathy.     Left upper body: No  supraclavicular or axillary adenopathy.  Neurological:     General: No focal deficit present.     Mental Status: She is alert and oriented to person, place, and time.  Psychiatric:        Mood and Affect: Mood normal.        Behavior: Behavior normal.     LABS:   CBC     Component Value Date/Time   WBC 3.5 (L) 09/11/2023 0930   RBC 2.93 (L) 09/11/2023 0930   HGB 10.0 (L) 09/11/2023 0930   HCT 28.7 (L) 09/11/2023 0930   PLT 185 09/11/2023 0930   MCV 98.0 09/11/2023 0930   MCH 34.1 (H) 09/11/2023 0930   MCHC 34.8 09/11/2023 0930   RDW 16.8 (H) 09/11/2023 0930   LYMPHSABS 2.0 09/11/2023 0930   MONOABS 0.4 09/11/2023 0930   EOSABS 0.0 09/11/2023 0930   BASOSABS 0.0 09/11/2023 0930    CMP      Component Value Date/Time   NA 137 09/11/2023 0930   K 3.7 09/11/2023 0930   CL 111 09/11/2023 0930   CO2 19 (L) 09/11/2023 0930   GLUCOSE 114 (H) 09/11/2023 0930   BUN 10 09/11/2023 0930   CREATININE 0.69 09/11/2023 0930   CALCIUM  8.6 (L) 09/11/2023 0930   PROT 7.4 09/11/2023 0930   ALBUMIN 3.5 09/11/2023 0930   AST 19 09/11/2023 0930   ALT 11 09/11/2023 0930   ALKPHOS 53 09/11/2023 0930   BILITOT 0.6 09/11/2023 0930   GFRNONAA >60 09/11/2023 0930   GFRAA >60 10/26/2015 1317     Lab Results  Component Value Date   CEA1 273.0 (H) 08/28/2023   /  CEA  Date Value Ref Range Status  08/28/2023 273.0 (H) 0.0 - 4.7 ng/mL Final    Comment:    (NOTE)                             Nonsmokers          <3.9                             Smokers             <5.6 [employer] Electrochemiluminescence Immunoassay (ECLIA) Values obtained with different assay methods or kits cannot be used interchangeably.  Results cannot be interpreted as absolute evidence of the presence or absence of malignant disease. Performed At: [facility] [address] [place], [place] [id number] Jennette Shorter MD Ey:[phone]    No results found for: PSA1 No results found for:  CAN199 No results found for: CAN125  No results found for: TOTALPROTELP, ALBUMINELP, A1GS, A2GS, BETS, BETA2SER, GAMS, MSPIKE, SPEI Lab Results  Component Value Date   TIBC 256 01/11/2021   FERRITIN 108  01/11/2021   IRONPCTSAT 4 (L) 01/11/2021   No results found for: LDH   STUDIES:   CT CHEST ABDOMEN PELVIS W CONTRAST Result Date: 09/07/2023 CLINICAL DATA:  Metastatic disease evaluation. Colorectal carcinoma. Surveillance exam. * Tracking Code: BO * EXAM: CT CHEST, ABDOMEN, AND PELVIS WITH CONTRAST TECHNIQUE: Multidetector CT imaging of the chest, abdomen and pelvis was performed following the standard protocol during bolus administration of intravenous contrast. RADIATION DOSE REDUCTION: This exam was performed according to the departmental dose-optimization program which includes automated exposure control, adjustment of the mA and/or kV according to patient size and/or use of iterative reconstruction technique. CONTRAST:  OMNIPAQUE  IOHEXOL  300 MG/ML  SOLN COMPARISON:  CT 06/07/2023 FINDINGS: CT CHEST FINDINGS Cardiovascular: Port in the anterior chest wall with tip in distal SVC. No significant vascular findings. Normal heart size. No pericardial effusion. Mediastinum/Nodes: No axillary or supraclavicular adenopathy. No mediastinal or hilar adenopathy. No pericardial fluid. Esophagus normal. Lungs/Pleura: No suspicious pulmonary nodules. Normal pleural. Airways normal. Musculoskeletal: No aggressive osseous lesion. CT ABDOMEN AND PELVIS FINDINGS Hepatobiliary: No focal hepatic lesion. Postcholecystectomy. No biliary dilatation. Pancreas: Pancreas is normal. No ductal dilatation. No pancreatic inflammation. Spleen: Normal spleen Adrenals/urinary tract: Adrenal glands and kidneys are normal. The ureters and bladder normal. Stomach/Bowel: Stomach, small bowel, appendix, and cecum are normal. Ascending transverse colon normal. End colostomy from the descending colon  through LEFT abdominal ostomy. Hartmann's pouch noted. No evidence of cancer recurrence within colon. Soft tissue nodularity in the sigmoid mesocolon the level of the LEFT common iliac artery measuring 17 x 18 mm compares to 15 x 21 mm remeasured. No significant change. Trace peritoneal thickening along the LEFT pericolic gutter slightly improved. No new peritoneal nodularity. Large multi cystic mass with enhancing septation and mural nodularity is again demonstrated within the RIGHT lower quadrant. Mass measures 15.6 by 12.0 cm compared to 15.3 by 11.6 cm. Solid nodular enhancing portion along the inferior margin of the cystic mass measures 6.2 x 5.0 cm compared to 6.7 by 5.5 cm. Overall minimal interval change in the cystic complex. Vascular/Lymphatic: Abdominal aorta is normal caliber. There is no retroperitoneal or periportal lymphadenopathy. No pelvic lymphadenopathy. Reproductive: Multi lobular uterus consistent leiomyoma. Submucosal leiomyoma extends into endometrial canal. There exophytic calcified leiomyoma. Pattern unchanged from comparison exam Other: No free-fluid.  No peritoneal nodularity. IMPRESSION: CHEST: No evidence of thoracic metastasis. PELVIS: 1. Stable soft tissue nodularity in the sigmoid mesocolon. Slight improvement in peritoneal nodularity along the LEFT pericolic gutter. 2. No significant change in large complex cystic mass in the RIGHT lower quadrant presumably originating from the RIGHT ovary. 3. LEFT colostomy without complication. 4. Stable leiomyomatous uterus. Electronically Signed   By: Jackquline Boxer M.D.   On: 09/07/2023 13:53

## 2023-09-11 NOTE — Progress Notes (Signed)
   09/11/23 1300  Spiritual Encounters  Type of Visit Initial  Care provided to: Patient  Conversation partners present during encounter Nurse  Referral source Chaplain assessment  Reason for visit  (Introduction to Spritiual Care)  OnCall Visit No  Spiritual Needs Other (Comment)  Spiritual Framework  Presenting Themes Meaning/purpose/sources of inspiration;Values and beliefs;Caregiving needs;Coping tools;Impactful experiences and emotions  Community/Connection Family;Faith community  Patient Stress Factors None identified  Family Stress Factors Not reviewed  Interventions  Spiritual Care Interventions Made Established relationship of care and support;Compassionate presence;Narrative/life review;Encouragement  Intervention Outcomes  Outcomes Awareness of support  Spiritual Care Plan  Spiritual Care Issues Still Outstanding Chaplain will continue to follow   Reason for Visit: Chaplain making rounds on the floor visiting infusion Pts  Description of Visit: Arriving in the room I found Diana Fox sitting in the recliner chair receiving infusion, with no support person present.  I introduced myself as the chaplain for the cancer center and offered a brief education on the role of a chaplain and the support we can offer to our patients, caregivers, and staff.    I began working to establish relationship and connection through encouraging story telling and life review.  Diana Fox began to share with me her cancer journey and she punctuated her narration with poignant scripture references.  She began to share with me the many ways that her faith has impacted and supported her journey.  I shared encouraging words with Diana Fox and thanked her for sharing her story with me.  As I spoke with her I assessed that her needs appear to be met and she has strong coping skills that serve her well.  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.  Plan of Care: I do not feel like significant spiritual intervention will be necessary in Diana Fox's future, but I will continue to check-in periodically to reassess.  Diana Fox, MDiv  Chaplain, [facility] [email or web address] [phone]

## 2023-09-11 NOTE — Progress Notes (Signed)
 Patient is taking Lonsurf  as prescribed. She has not missed any doses and reports no side effects at this time.  Patient has been assessed, vital signs and labs have been reviewed by Dr. Rogers. ANC, Creatinine, LFTs, and Platelets are within treatment parameters per Dr. Rogers. The patient is good to proceed with treatment at this time. Primary RN and pharmacy aware.

## 2023-09-11 NOTE — Patient Instructions (Addendum)
 [facility]  Discharge Instructions  You were seen and examined today by Dr. Rogers.  Dr. Rogers discussed your most recent lab work and CT scan which is stable.  Proceed with your treatment as planned.  Follow-up as scheduled.  Thank you for choosing [facility] to provide your oncology and hematology care.   To afford each patient quality time with our provider, please arrive at least 15 minutes before your scheduled appointment time. You may need to reschedule your appointment if you arrive late (10 or more minutes). Arriving late affects you and other patients whose appointments are after yours.  Also, if you miss three or more appointments without notifying the office, you may be dismissed from the clinic at the provider's discretion.    Again, thank you for choosing [facility].  Our hope is that these requests will decrease the amount of time that you wait before being seen by our physicians.   If you have a lab appointment with the Cancer Center - please note that after April 8th, all labs will be drawn in the cancer center.  You do not have to check in or register with the main entrance as you have in the past but will complete your check-in at the cancer center.            _____________________________________________________________  Should you have questions after your visit to [facility], please contact our office at [phone] and follow the prompts.  Our office hours are 8:00 a.m. to 4:30 p.m. Monday - Thursday and 8:00 a.m. to 2:30 p.m. Friday.  Please note that voicemails left after 4:00 p.m. may not be returned until the following business day.  We are closed weekends and all major holidays.  You do have access to a nurse 24-7, just call the main number to the clinic [phone] and do not press any options, hold on the line and a nurse will answer the phone.    For prescription  refill requests, have your pharmacy contact our office and allow 72 hours.    Masks are no longer required in the cancer centers. If you would like for your care team to wear a mask while they are taking care of you, please let them know. You may have one support person who is at least 53 years old accompany you for your appointments.

## 2023-09-11 NOTE — Progress Notes (Signed)
 Ok to treat with bevacizumab  with ANC 1.  V.O. Dr Theadore Molt, PharmD

## 2023-09-12 LAB — CEA: CEA: 306 ng/mL — ABNORMAL HIGH (ref 0.0–4.7)

## 2023-09-25 ENCOUNTER — Other Ambulatory Visit: Payer: Self-pay

## 2023-09-25 ENCOUNTER — Inpatient Hospital Stay

## 2023-09-25 ENCOUNTER — Ambulatory Visit

## 2023-09-25 ENCOUNTER — Encounter: Payer: Self-pay | Admitting: Oncology

## 2023-09-25 ENCOUNTER — Encounter (HOSPITAL_COMMUNITY): Payer: Self-pay | Admitting: Oncology

## 2023-09-25 ENCOUNTER — Other Ambulatory Visit: Payer: Self-pay | Admitting: Oncology

## 2023-09-25 VITALS — BP 119/75 | HR 83 | Temp 97.9°F | Resp 18

## 2023-09-25 VITALS — BP 129/82 | HR 87 | Temp 97.5°F | Resp 18 | Wt 170.0 lb

## 2023-09-25 DIAGNOSIS — Z5112 Encounter for antineoplastic immunotherapy: Secondary | ICD-10-CM | POA: Diagnosis not present

## 2023-09-25 DIAGNOSIS — Z95828 Presence of other vascular implants and grafts: Secondary | ICD-10-CM

## 2023-09-25 DIAGNOSIS — C189 Malignant neoplasm of colon, unspecified: Secondary | ICD-10-CM

## 2023-09-25 LAB — URINALYSIS, DIPSTICK ONLY
Bilirubin Urine: NEGATIVE
Glucose, UA: NEGATIVE mg/dL
Hgb urine dipstick: NEGATIVE
Ketones, ur: NEGATIVE mg/dL
Nitrite: NEGATIVE
Protein, ur: NEGATIVE mg/dL
Specific Gravity, Urine: 1.015 (ref 1.005–1.030)
pH: 5 (ref 5.0–8.0)

## 2023-09-25 LAB — CBC WITH DIFFERENTIAL/PLATELET
Abs Immature Granulocytes: 0.01 K/uL (ref 0.00–0.07)
Basophils Absolute: 0 K/uL (ref 0.0–0.1)
Basophils Relative: 0 %
Eosinophils Absolute: 0 K/uL (ref 0.0–0.5)
Eosinophils Relative: 0 %
HCT: 23.9 % — ABNORMAL LOW (ref 36.0–46.0)
Hemoglobin: 8.4 g/dL — ABNORMAL LOW (ref 12.0–15.0)
Immature Granulocytes: 0 %
Lymphocytes Relative: 50 %
Lymphs Abs: 1.3 K/uL (ref 0.7–4.0)
MCH: 34 pg (ref 26.0–34.0)
MCHC: 35.1 g/dL (ref 30.0–36.0)
MCV: 96.8 fL (ref 80.0–100.0)
Monocytes Absolute: 0.1 K/uL (ref 0.1–1.0)
Monocytes Relative: 3 %
Neutro Abs: 1.2 K/uL — ABNORMAL LOW (ref 1.7–7.7)
Neutrophils Relative %: 47 %
Platelets: 141 K/uL — ABNORMAL LOW (ref 150–400)
RBC: 2.47 MIL/uL — ABNORMAL LOW (ref 3.87–5.11)
RDW: 15.7 % — ABNORMAL HIGH (ref 11.5–15.5)
WBC: 2.6 K/uL — ABNORMAL LOW (ref 4.0–10.5)
nRBC: 0 % (ref 0.0–0.2)

## 2023-09-25 LAB — COMPREHENSIVE METABOLIC PANEL WITH GFR
ALT: 8 U/L (ref 0–44)
AST: 16 U/L (ref 15–41)
Albumin: 3.4 g/dL — ABNORMAL LOW (ref 3.5–5.0)
Alkaline Phosphatase: 49 U/L (ref 38–126)
Anion gap: 9 (ref 5–15)
BUN: 10 mg/dL (ref 6–20)
CO2: 18 mmol/L — ABNORMAL LOW (ref 22–32)
Calcium: 8.6 mg/dL — ABNORMAL LOW (ref 8.9–10.3)
Chloride: 108 mmol/L (ref 98–111)
Creatinine, Ser: 0.61 mg/dL (ref 0.44–1.00)
GFR, Estimated: >60 mL/min (ref >60)
Glucose, Bld: 95 mg/dL (ref 70–99)
Potassium: 3.8 mmol/L (ref 3.5–5.1)
Sodium: 135 mmol/L (ref 135–145)
Total Bilirubin: 1 mg/dL (ref 0.0–1.2)
Total Protein: 7 g/dL (ref 6.5–8.1)

## 2023-09-25 MED ORDER — SODIUM CHLORIDE 0.9 % IV SOLN
5.0000 mg/kg | Freq: Once | INTRAVENOUS | Status: AC
  Administered 2023-09-25: 400 mg via INTRAVENOUS
  Filled 2023-09-25: qty 16

## 2023-09-25 MED ORDER — SODIUM CHLORIDE 0.9 % IV SOLN
INTRAVENOUS | Status: DC
Start: 2023-09-25 — End: 2023-09-25

## 2023-09-25 NOTE — Progress Notes (Signed)
 Patient presents today for Vegzelma  infusion.  Patient is in satisfactory condition with no new complaints voiced.  Vital signs are stable.  Labs reviewed and all other  labs are within treatment parameters. Patient's ANC noted to be 1.2 today. Dr.Kandala made aware. We will proceed with treatment per MD orders.    Treatment given today per MD orders. Tolerated infusion without adverse affects. Vital signs stable. No complaints at this time. Discharged from clinic ambulatory in stable condition. Alert and oriented x 3. F/U with [facility] as scheduled.

## 2023-09-25 NOTE — Progress Notes (Signed)
   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  Description of Visit: Entering the room Diana Fox recognized me and gave me a great big smile.  I immediately asked how she was feeling about starting school today and she expressed excitement. While today is the official first today she does not anticipate being able to make it today and will pick up tomorrow.  She is confident, however, that it will be ok.  Diana Fox asked me for prayer and I provided. Praying for encouragement and strength for the journey ahead of her.  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  Plan of Care: I will continue to follow Diana Fox on a monthly basis.   Maude Roll, MDiv  Chaplain, [facility] [email or web address] [phone]

## 2023-09-25 NOTE — Patient Instructions (Signed)
 [facility] [place] - A DEPT OF [facility]  Discharge Instructions: Thank you for choosing [facility] to provide your oncology and hematology care.  If you have a lab appointment with the Cancer Center - please note that after April 8th, 2024, all labs will be drawn in the cancer center.  You do not have to check in or register with the main entrance as you have in the past but will complete your check-in in the cancer center.  Wear comfortable clothing and clothing appropriate for easy access to any Portacath or PICC line.   We strive to give you quality time with your provider. You may need to reschedule your appointment if you arrive late (15 or more minutes).  Arriving late affects you and other patients whose appointments are after yours.  Also, if you miss three or more appointments without notifying the office, you may be dismissed from the clinic at the provider's discretion.      For prescription refill requests, have your pharmacy contact our office and allow 72 hours for refills to be completed.    Today you received the following chemotherapy and/or immunotherapy agents Vegzelma   To help prevent nausea and vomiting after your treatment, we encourage you to take your nausea medication as directed.  Bevacizumab Injection What is this medication? BEVACIZUMAB (be va SIZ yoo mab) treats some types of cancer. It works by blocking a protein that causes cancer cells to grow and multiply. This helps to slow or stop the spread of cancer cells. It is a monoclonal antibody. This medicine may be used for other purposes; ask your health care provider or pharmacist if you have questions. COMMON BRAND NAME(S): Alymsys, Avastin, MVASI, Rosaland Lao What should I tell my care team before I take this medication? They need to know if you have any of these conditions: Blood clots Coughing up blood Having or recent surgery Heart failure High blood  pressure History of a connection between 2 or more body parts that do not usually connect (fistula) History of a tear in your stomach or intestines Protein in your urine An unusual or allergic reaction to bevacizumab, other medications, foods, dyes, or preservatives Pregnant or trying to get pregnant Breast-feeding How should I use this medication? This medication is injected into a vein. It is given by your care team in a hospital or clinic setting. Talk to your care team the use of this medication in children. Special care may be needed. Overdosage: If you think you have taken too much of this medicine contact a poison control center or emergency room at once. NOTE: This medicine is only for you. Do not share this medicine with others. What if I miss a dose? Keep appointments for follow-up doses. It is important not to miss your dose. Call your care team if you are unable to keep an appointment. What may interact with this medication? Interactions are not expected. This list may not describe all possible interactions. Give your health care provider a list of all the medicines, herbs, non-prescription drugs, or dietary supplements you use. Also tell them if you smoke, drink alcohol, or use illegal drugs. Some items may interact with your medicine. What should I watch for while using this medication? Your condition will be monitored carefully while you are receiving this medication. You may need blood work while taking this medication. This medication may make you feel generally unwell. This is not uncommon as chemotherapy can  affect healthy cells as well as cancer cells. Report any side effects. Continue your course of treatment even though you feel ill unless your care team tells you to stop. This medication may increase your risk to bruise or bleed. Call your care team if you notice any unusual bleeding. Before having surgery, talk to your care team to make sure it is ok. This medication can  increase the risk of poor healing of your surgical site or wound. You will need to stop this medication for 28 days before surgery. After surgery, wait at least 28 days before restarting this medication. Make sure the surgical site or wound is healed enough before restarting this medication. Talk to your care team if questions. Talk to your care team if you may be pregnant. Serious birth defects can occur if you take this medication during pregnancy and for 6 months after the last dose. Contraception is recommended while taking this medication and for 6 months after the last dose. Your care team can help you find the option that works for you. Do not breastfeed while taking this medication and for 6 months after the last dose. This medication can cause infertility. Talk to your care team if you are concerned about your fertility. What side effects may I notice from receiving this medication? Side effects that you should report to your care team as soon as possible: Allergic reactions--skin rash, itching, hives, swelling of the face, lips, tongue, or throat Bleeding--bloody or black, tar-like stools, vomiting blood or brown material that looks like coffee grounds, red or dark brown urine, small red or purple spots on skin, unusual bruising or bleeding Blood clot--pain, swelling, or warmth in the leg, shortness of breath, chest pain Heart attack--pain or tightness in the chest, shoulders, arms, or jaw, nausea, shortness of breath, cold or clammy skin, feeling faint or lightheaded Heart failure--shortness of breath, swelling of the ankles, feet, or hands, sudden weight gain, unusual weakness or fatigue Increase in blood pressure Infection--fever, chills, cough, sore throat, wounds that don't heal, pain or trouble when passing urine, general feeling of discomfort or being unwell Infusion reactions--chest pain, shortness of breath or trouble breathing, feeling faint or lightheaded Kidney injury--decrease in  the amount of urine, swelling of the ankles, hands, or feet Stomach pain that is severe, does not go away, or gets worse Stroke--sudden numbness or weakness of the face, arm, or leg, trouble speaking, confusion, trouble walking, loss of balance or coordination, dizziness, severe headache, change in vision Sudden and severe headache, confusion, change in vision, seizures, which may be signs of posterior reversible encephalopathy syndrome (PRES) Side effects that usually do not require medical attention (report to your care team if they continue or are bothersome): Back pain Change in taste Diarrhea Dry skin Increased tears Nosebleed This list may not describe all possible side effects. Call your doctor for medical advice about side effects. You may report side effects to FDA at 1-800-FDA-1088. Where should I keep my medication? This medication is given in a hospital or clinic. It will not be stored at home. NOTE: This sheet is a summary. It may not cover all possible information. If you have questions about this medicine, talk to your doctor, pharmacist, or health care provider.  2024 Elsevier/Gold Standard (2021-06-11 00:00:00)    BELOW ARE SYMPTOMS THAT SHOULD BE REPORTED IMMEDIATELY: *FEVER GREATER THAN 100.4 F (38 C) OR HIGHER *CHILLS OR SWEATING *NAUSEA AND VOMITING THAT IS NOT CONTROLLED WITH YOUR NAUSEA MEDICATION *UNUSUAL SHORTNESS OF BREATH *  UNUSUAL BRUISING OR BLEEDING *URINARY PROBLEMS (pain or burning when urinating, or frequent urination) *BOWEL PROBLEMS (unusual diarrhea, constipation, pain near the anus) TENDERNESS IN MOUTH AND THROAT WITH OR WITHOUT PRESENCE OF ULCERS (sore throat, sores in mouth, or a toothache) UNUSUAL RASH, SWELLING OR PAIN  UNUSUAL VAGINAL DISCHARGE OR ITCHING   Items with * indicate a potential emergency and should be followed up as soon as possible or go to the Emergency Department if any problems should occur.  Please show the CHEMOTHERAPY  ALERT CARD or IMMUNOTHERAPY ALERT CARD at check-in to the Emergency Department and triage nurse.  Should you have questions after your visit or need to cancel or reschedule your appointment, please contact [facility] [place] - A DEPT OF [facility] [phone]  and follow the prompts.  Office hours are 8:00 a.m. to 4:30 p.m. Monday - Friday. Please note that voicemails left after 4:00 p.m. may not be returned until the following business day.  We are closed weekends and major holidays. You have access to a nurse at all times for urgent questions. Please call the main number to the clinic [phone] and follow the prompts.  For any non-urgent questions, you may also contact your provider using MyChart. We now offer e-Visits for anyone 17 and older to request care online for non-urgent symptoms. For details visit [email or web address].   Also download the MyChart app! Go to the app store, search "MyChart", open the app, select [place], and log in with your MyChart username and password.

## 2023-09-26 ENCOUNTER — Other Ambulatory Visit: Payer: Self-pay

## 2023-09-26 ENCOUNTER — Other Ambulatory Visit (HOSPITAL_COMMUNITY): Payer: Self-pay

## 2023-09-26 ENCOUNTER — Telehealth: Payer: Self-pay | Admitting: Pharmacy Technician

## 2023-09-26 ENCOUNTER — Other Ambulatory Visit: Payer: Self-pay | Admitting: Pharmacy Technician

## 2023-09-26 NOTE — Telephone Encounter (Signed)
 Oral Oncology Patient Advocate Encounter  Received message from [facility] call center team that patient's insurance is no longer active.  I attempted to call the patient to ask if she was in the process of getting new insurance or if she will be uninsured to know how to move forward with getting the patient more medication. No answer, LVM to call back.  Roald Lukacs (Patty) Chet Burnet, CPhT  [facility], [facility], [facility] Patient Advocate Specialist III Phone: [phone]  Fax: [phone]

## 2023-09-27 ENCOUNTER — Other Ambulatory Visit: Payer: Self-pay

## 2023-09-27 NOTE — Telephone Encounter (Signed)
 Oral Oncology Patient Advocate Encounter  Per specialty call center, patient contacted them and is aware of the situation.  Patient is looking into the issue and will call back once she knows what happened.  Diana Fox (Patty) Chet Burnet, CPhT  [facility], [facility], [facility] Patient Advocate Specialist III Phone: [phone]  Fax: [phone]

## 2023-09-28 ENCOUNTER — Other Ambulatory Visit (HOSPITAL_COMMUNITY): Payer: Self-pay

## 2023-09-29 ENCOUNTER — Other Ambulatory Visit: Payer: Self-pay

## 2023-10-02 ENCOUNTER — Other Ambulatory Visit (HOSPITAL_COMMUNITY): Payer: Self-pay

## 2023-10-02 ENCOUNTER — Encounter: Payer: Self-pay | Admitting: Oncology

## 2023-10-02 ENCOUNTER — Encounter (HOSPITAL_COMMUNITY): Payer: Self-pay | Admitting: Oncology

## 2023-10-02 ENCOUNTER — Other Ambulatory Visit: Payer: Self-pay

## 2023-10-02 NOTE — Progress Notes (Signed)
   10/02/23 1100  Spiritual Encounters  Type of Visit Follow up;Attempt (pt unavailable)  Referral source Chaplain assessment  Reason for visit Routine spiritual support   Chaplain attempted follow-up via telephone.  I called [phone] and was connected to voicemail.. 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    Will continue to follow up with Pt.  Maude Roll, MDiv  Chaplain, [facility] [email or web address] [email or web address] [phone]

## 2023-10-02 NOTE — Progress Notes (Signed)
 Specialty Pharmacy Refill Coordination Note  Diana Fox is a 53 y.o. female contacted today regarding refills of specialty medication(s) Trifluridine -Tipiracil  (Lonsurf )   Patient requested Delivery   Delivery date: 10/05/23   Verified address: [address]   [place] [address]   Medication will be filled on 10/04/23. New insurance information was updated.

## 2023-10-02 NOTE — Progress Notes (Signed)
 Specialty Pharmacy Ongoing Clinical Assessment Note  Diana Fox is a 53 y.o. female who is being followed by the specialty pharmacy service for RxSp Oncology   Patient's specialty medication(s) reviewed today: Trifluridine -Tipiracil  (Lonsurf )   Missed doses in the last 4 weeks: 0   Patient/Caregiver did not have any additional questions or concerns.   Therapeutic benefit summary: Patient is achieving benefit   Adverse events/side effects summary: No adverse events/side effects   Patient's therapy is appropriate to: Continue    Goals Addressed             This Visit's Progress    Slow Disease Progression   On track    Patient is unable to be assessed as therapy was recently initiated. Patient will maintain adherence.  Most recent CT CAP (09/05/2023) showed overall stable disease with slight improvement in peritoneal nodularity along the left pericolic gutter (per office visit notes from Dr. Rogers on 09/11/23).          Follow up: 3 months  Silvano LOISE Dolly Specialty Pharmacist

## 2023-10-03 ENCOUNTER — Other Ambulatory Visit: Payer: Self-pay

## 2023-10-04 ENCOUNTER — Other Ambulatory Visit (HOSPITAL_COMMUNITY): Payer: Self-pay

## 2023-10-04 ENCOUNTER — Other Ambulatory Visit: Payer: Self-pay

## 2023-10-04 ENCOUNTER — Other Ambulatory Visit: Payer: Self-pay | Admitting: Pharmacy Technician

## 2023-10-04 ENCOUNTER — Encounter: Payer: Self-pay | Admitting: Oncology

## 2023-10-04 ENCOUNTER — Encounter (HOSPITAL_COMMUNITY): Payer: Self-pay | Admitting: Oncology

## 2023-10-04 ENCOUNTER — Telehealth: Payer: Self-pay | Admitting: Pharmacy Technician

## 2023-10-04 NOTE — Telephone Encounter (Signed)
 Oral Oncology Patient Advocate Encounter  New insurance re-authorization  Prior authorization and cost exceeds maximum override had to be requested via phone call to patient's new insurance  Prior Authorization for Lonsurf  has been approved.    PA# None provided by insurance representative. Effective dates: 09/08/2023 through 02/07/2024  Patients co-pay is $4.48.    Diana Fox (Patty) Chet Burnet, CPhT  [facility], [facility], [facility] Patient Advocate Specialist III Phone: [phone]  Fax: [phone]

## 2023-10-04 NOTE — Telephone Encounter (Signed)
 Opened in error.   Raliegh Scobie (Patty) Chet Burnet, CPhT  [facility], [facility], [facility] Patient Advocate Specialist III Phone: [phone]  Fax: [phone]

## 2023-10-04 NOTE — Telephone Encounter (Signed)
 Oral Oncology Patient Advocate Encounter  Patient has received new insurance.   I will submit a new prior auth and cost exceeds maximum override request via phone to patient's new insurance.   Please see new encounter for information.   Diana Fox (Patty) Chet Burnet, CPhT  [facility], [facility], [facility] Patient Advocate Specialist III Phone: [phone]  Fax: [phone]

## 2023-10-04 NOTE — Progress Notes (Signed)
 Oral Oncology Patient Advocate Encounter  Patient had insurance issues initially but it turns out that patient now has new insurance.  Prior authorization and cost exceeds maximum override have been submitted and approved.  Patient can continue to fill at [facility].  Jenavieve Freda (Patty) Chet Burnet, CPhT  [facility], [facility], [facility] Patient Advocate Specialist III Phone: [phone]  Fax: [phone]

## 2023-10-10 ENCOUNTER — Inpatient Hospital Stay (HOSPITAL_BASED_OUTPATIENT_CLINIC_OR_DEPARTMENT_OTHER): Admitting: Oncology

## 2023-10-10 ENCOUNTER — Other Ambulatory Visit: Payer: Self-pay | Admitting: Oncology

## 2023-10-10 ENCOUNTER — Inpatient Hospital Stay: Attending: Hematology

## 2023-10-10 ENCOUNTER — Inpatient Hospital Stay

## 2023-10-10 VITALS — BP 135/84 | HR 74 | Temp 97.7°F | Resp 18 | Wt 166.0 lb

## 2023-10-10 DIAGNOSIS — Z933 Colostomy status: Secondary | ICD-10-CM | POA: Diagnosis not present

## 2023-10-10 DIAGNOSIS — Z79899 Other long term (current) drug therapy: Secondary | ICD-10-CM | POA: Diagnosis not present

## 2023-10-10 DIAGNOSIS — K56699 Other intestinal obstruction unspecified as to partial versus complete obstruction: Secondary | ICD-10-CM | POA: Diagnosis not present

## 2023-10-10 DIAGNOSIS — D539 Nutritional anemia, unspecified: Secondary | ICD-10-CM | POA: Insufficient documentation

## 2023-10-10 DIAGNOSIS — Z793 Long term (current) use of hormonal contraceptives: Secondary | ICD-10-CM | POA: Insufficient documentation

## 2023-10-10 DIAGNOSIS — G622 Polyneuropathy due to other toxic agents: Secondary | ICD-10-CM | POA: Diagnosis not present

## 2023-10-10 DIAGNOSIS — C187 Malignant neoplasm of sigmoid colon: Secondary | ICD-10-CM | POA: Insufficient documentation

## 2023-10-10 DIAGNOSIS — C786 Secondary malignant neoplasm of retroperitoneum and peritoneum: Secondary | ICD-10-CM | POA: Insufficient documentation

## 2023-10-10 DIAGNOSIS — Z5112 Encounter for antineoplastic immunotherapy: Secondary | ICD-10-CM | POA: Diagnosis present

## 2023-10-10 DIAGNOSIS — D259 Leiomyoma of uterus, unspecified: Secondary | ICD-10-CM | POA: Insufficient documentation

## 2023-10-10 DIAGNOSIS — Z95828 Presence of other vascular implants and grafts: Secondary | ICD-10-CM

## 2023-10-10 DIAGNOSIS — D509 Iron deficiency anemia, unspecified: Secondary | ICD-10-CM | POA: Diagnosis not present

## 2023-10-10 DIAGNOSIS — K648 Other hemorrhoids: Secondary | ICD-10-CM | POA: Diagnosis not present

## 2023-10-10 DIAGNOSIS — C189 Malignant neoplasm of colon, unspecified: Secondary | ICD-10-CM

## 2023-10-10 DIAGNOSIS — C772 Secondary and unspecified malignant neoplasm of intra-abdominal lymph nodes: Secondary | ICD-10-CM | POA: Insufficient documentation

## 2023-10-10 DIAGNOSIS — G629 Polyneuropathy, unspecified: Secondary | ICD-10-CM | POA: Insufficient documentation

## 2023-10-10 DIAGNOSIS — C7989 Secondary malignant neoplasm of other specified sites: Secondary | ICD-10-CM

## 2023-10-10 LAB — CBC WITH DIFFERENTIAL/PLATELET
Abs Immature Granulocytes: 0.03 K/uL (ref 0.00–0.07)
Basophils Absolute: 0 K/uL (ref 0.0–0.1)
Basophils Relative: 0 %
Eosinophils Absolute: 0 K/uL (ref 0.0–0.5)
Eosinophils Relative: 0 %
HCT: 26.6 % — ABNORMAL LOW (ref 36.0–46.0)
Hemoglobin: 9 g/dL — ABNORMAL LOW (ref 12.0–15.0)
Immature Granulocytes: 1 %
Lymphocytes Relative: 49 %
Lymphs Abs: 1.7 K/uL (ref 0.7–4.0)
MCH: 34.4 pg — ABNORMAL HIGH (ref 26.0–34.0)
MCHC: 33.8 g/dL (ref 30.0–36.0)
MCV: 101.5 fL — ABNORMAL HIGH (ref 80.0–100.0)
Monocytes Absolute: 0.5 K/uL (ref 0.1–1.0)
Monocytes Relative: 14 %
Neutro Abs: 1.3 K/uL — ABNORMAL LOW (ref 1.7–7.7)
Neutrophils Relative %: 36 %
Platelets: 188 K/uL (ref 150–400)
RBC: 2.62 MIL/uL — ABNORMAL LOW (ref 3.87–5.11)
RDW: 19 % — ABNORMAL HIGH (ref 11.5–15.5)
WBC: 3.5 K/uL — ABNORMAL LOW (ref 4.0–10.5)
nRBC: 0 % (ref 0.0–0.2)

## 2023-10-10 LAB — SAMPLE TO BLOOD BANK

## 2023-10-10 LAB — COMPREHENSIVE METABOLIC PANEL WITH GFR
ALT: 10 U/L (ref 0–44)
AST: 17 U/L (ref 15–41)
Albumin: 3.4 g/dL — ABNORMAL LOW (ref 3.5–5.0)
Alkaline Phosphatase: 56 U/L (ref 38–126)
Anion gap: 8 (ref 5–15)
BUN: 8 mg/dL (ref 6–20)
CO2: 19 mmol/L — ABNORMAL LOW (ref 22–32)
Calcium: 8.5 mg/dL — ABNORMAL LOW (ref 8.9–10.3)
Chloride: 110 mmol/L (ref 98–111)
Creatinine, Ser: 0.7 mg/dL (ref 0.44–1.00)
GFR, Estimated: >60 mL/min (ref >60)
Glucose, Bld: 98 mg/dL (ref 70–99)
Potassium: 3.5 mmol/L (ref 3.5–5.1)
Sodium: 137 mmol/L (ref 135–145)
Total Bilirubin: 0.6 mg/dL (ref 0.0–1.2)
Total Protein: 7 g/dL (ref 6.5–8.1)

## 2023-10-10 LAB — IRON AND TIBC
Iron: 83 ug/dL (ref 28–170)
Saturation Ratios: 33 % — ABNORMAL HIGH (ref 10.4–31.8)
TIBC: 254 ug/dL (ref 250–450)
UIBC: 171 ug/dL

## 2023-10-10 LAB — FERRITIN: Ferritin: 176 ng/mL (ref 11–307)

## 2023-10-10 LAB — FOLATE: Folate: 16.2 ng/mL (ref >5.9)

## 2023-10-10 LAB — VITAMIN B12: Vitamin B-12: 543 pg/mL (ref 180–914)

## 2023-10-10 LAB — MAGNESIUM: Magnesium: 1.8 mg/dL (ref 1.7–2.4)

## 2023-10-10 NOTE — Patient Instructions (Signed)

## 2023-10-10 NOTE — Progress Notes (Signed)
 Diana Fox presented for Portacath access and flush with lab work for treatment. Portacath located right chest wall accessed with  H 20 needle.  Good blood return present. Portacath flushed with 20ml NS and needle removed intact.  Procedure tolerated well and without incident. Discharged from clinic ambulatory in stable condition to appointment with Dr. Davonna. Alert and oriented x 3. F/U with [facility] as scheduled.

## 2023-10-10 NOTE — Assessment & Plan Note (Signed)
 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 Iron  panel, vitamin B12 and folate WNL  - Continue to monitor - Will transfuse for hemoglobin less than 7 or less than 8 with symptoms

## 2023-10-10 NOTE — Progress Notes (Signed)
 Patient has been examined by Dr. Davonna. Vital signs and labs have been reviewed by MD - ANC (1.3), Creatinine, LFTs, hemoglobin, and platelets have been reviewed by M.D. - pt may proceed with treatment tomorrow. Primary RN and pharmacy notified.

## 2023-10-10 NOTE — Progress Notes (Addendum)
 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 female was transferred to my care after her prior physician has left.   ASSESSMENT & PLAN:   Assessment & Plan: Diana Fox  is a 53 y.o. 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 S/p first-line treatment with FOLFOXIRI and panitumumab  progression Second line-FOLFOX plus panitumumab  Third line-Lonsurf  plus bevacizumab   - Patient tolerating Lonsurf  and bevacizumab  well.  She reports no complaints from it today - Scheduled for day 1 cycle 5 tomorrow. - We reviewed the last CT scan findings in detail.  Discussed repeating the CT scan sooner with elevation CEA recently and will be done prior to cycle 6 -CEA continuing to rise, will keep a close eye - Labs reviewed today: CMP: Normal creatinine, normal potassium and magnesium , normal LFTs.  CBC: WBC: 3.5, hemoglobin: 9, platelets: 188.  UA with no proteinuria - Can proceed with chemotherapy tomorrow   Return to clinic prior to cycle 6 with a CT scan for follow-up and next infusion Macrocytic anemia Likely secondary to myelosuppression from chemotherapy. 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  She knows to contact our  office if she develops concerns prior to her next appointment.  40 minutes of total time was spent for this patient encounter, including preparation,review of records,  face-to-face counseling with the patient and coordination of care, physical exam, and documentation of the encounter.    LILLETTE Verneta SAUNDERS Teague,acting as a Neurosurgeon for Mickiel Dry, MD.,have documented all relevant documentation on the behalf of Mickiel Dry, MD,as directed by  Mickiel Dry, MD while in the presence of Mickiel Dry, MD.  I, Mickiel Dry MD, have reviewed the above documentation for accuracy and completeness, and I agree with the above.    Mickiel Dry, MD   [facility] [place] - A DEPT OF [facility] [address] [place] [address] Dept: [phone] Dept Fax: [phone]   Orders Placed This Encounter  Procedures   CT CHEST ABDOMEN PELVIS W CONTRAST    Standing Status:   Future    Expected Date:   11/09/2023    Expiration Date:   10/09/2024    If indicated for the ordered procedure, I authorize the administration of contrast media per Radiology protocol:   Yes    Does the patient have a contrast media/X-ray dye allergy?:   No    Preferred imaging location?:   [facility]    If indicated for the ordered procedure, I authorize the administration of oral contrast media per Radiology protocol:   Yes   Iron  and TIBC ([facility] ONLY)   Ferritin   Vitamin B12   Folate     ONCOLOGY HISTORY:   I have reviewed her chart and materials  related to her cancer extensively and collaborated history with the patient. 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 There are bilateral mildly enlarged common iliac chain nodes associated with this. There are mildly dilated mid to lower abdominal small bowel segments up to 3.2 cm without visible transition and decompressed but occasionally thickened small bowel segments in the right lower abdomen. -03/02/2021: Colonoscopy found: Nonbleeding internal hemorrhoids, severe stenosis in the sigmoid colon at approximately 18 cm from anal verge.  Pathology of sigmoid colon: Adenocarcinoma, moderately to poorly differentiated.  -03/24/2021: CEA 61.5.  -03/26/2021: Sigmoid colon resection, partial small bowel resection and Hartman's procedure. Tumor extended outside the colon to the pelvic brim and the surgeon was unable to fully excise all of the tumor in the pelvis.  Pathology: Colonic adenocarcinoma, moderately to poorly differentiated, involving the subserosal adipose tissue and present at the margin. Small bowel resection positive for adenocarcinoma nodules in the serosal fat. 9 out of 9 lymph nodes positive for metastatic carcinoma (9/9). MMR preserved. pT4, pN2b  -03/29/2021: NGS: KRAS/NRAS negative, BRAF negative, HER2 IHC (0) negative, MSI-stable, PDL1 : 0, Negative, POLE: Negative, PTEN: IHC: positive 1+, 100%. -04/29/2021: PET: Multifocal hypermetabolic nodal, peritoneal and anterior abdominal wall hypermetabolic activity. Based on the presence of metastatic disease within lymph nodes and along the serosal surface of the small bowel at time of the patient's prior surgery, these findings are worrisome for progressive nodal metastatic disease and peritoneal carcinomatosis. No evidence of hepatic metastatic disease or other distant metastases. -05/17/2021-11/2021: FOLFOXIRI and Panitumumab , oxaliplatin  discontinued after 10 cycles -11/2021-12/29/2022:  FOLFIRI and Panitumumab , discontinued due to progression -01/10/2023: CT CAP: Interval enlargement of a heterogeneous mixed solid and cystic mass arising from the right adnexa, consistent with worsened ovarian metastatic disease. Interval enlargement of a soft tissue mass in the sigmoid mesentery consistent with worsened mesenteric soft tissue or nodal metastatic disease. -10/04/2021: Colonoscopy negative for malignancy.  -01/18/2023-05/31/2023: FOLFOX and bevacizumab , discontinued due to progression -06/07/2023: CT CAP: Interval enlargement of the complex mixed cystic and solid mass arising from the right adnexa. Interval enlargement of the soft tissue mass/lymph node in the sigmoid mesentery along the left common iliac artery. -06/16/2023: [employer]: GNAS, T p53, APC, MSI high not detected, TMB-low.  -06/19/2023-current: Lonsurf  and bevacizumab   -09/05/2023: CT CAP: Stable soft tissue nodularity in the sigmoid mesocolon. Slight improvement in peritoneal nodularity along the LEFT pericolic gutter.  No significant change in large complex cystic mass in the RIGHT lower quadrant presumably originating from the RIGHT ovary. -09/11/2023: CEA: 306   Current Treatment:  Lonsurf  and bevacizumab    INTERVAL HISTORY:   Janaysia Mcleroy is here today for follow up.   Her symptoms include numbness and tingling in her hands and feet which have improved significantly. No nausea, vomiting, fatigue, or weight loss. Her appetite remains good.  She has no complaints today. 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   I have reviewed the past medical history, past surgical history, social history and family history with the patient and they are unchanged from previous note.  ALLERGIES:  is allergic to zithromax  [azithromycin ], motrin [ibuprofen], sudafed [  pseudoephedrine  hcl], and zestril [lisinopril].  MEDICATIONS:  Current Outpatient Medications  Medication Sig Dispense Refill   amLODipine  (NORVASC ) 10  MG tablet TAKE 1 TABLET BY MOUTH EVERY DAY 90 tablet 1   cetirizine (ZYRTEC) 10 MG tablet SMARTSIG:1 Tablet(s) By Mouth Every Evening     fexofenadine  (ALLEGRA ) 180 MG tablet Take 1 tablet (180 mg total) by mouth daily. 30 tablet 0   fluorouracil  CALGB 19297 2,400 mg/m2 in sodium chloride  0.9 % 150 mL Inject 2,400 mg/m2 into the vein over 48 hr. Every 14 days     FLUOROURACIL  IV Inject into the vein every 14 (fourteen) days.     fluticasone  (FLONASE ) 50 MCG/ACT nasal spray Place 2 sprays into both nostrils daily. 16 g 12   magnesium  oxide (MAG-OX) 400 (240 Mg) MG tablet Take 1 tablet (400 mg total) by mouth 2 (two) times daily. 60 tablet 6   medroxyPROGESTERone (DEPO-PROVERA) 150 MG/ML injection Inject 150 mg into the muscle every 3 (three) months.     Multiple Vitamin (MULTIVITAMIN) tablet Take 1 tablet by mouth daily.     nebivolol  (BYSTOLIC ) 10 MG tablet Take 10 mg by mouth daily.     OXALIPLATIN  IV Inject into the vein every 14 (fourteen) days.     Panitumumab  (VECTIBIX  IV) Inject into the vein every 14 (fourteen) days.     pantoprazole  (PROTONIX ) 40 MG tablet Take 40 mg by mouth daily as needed (acid reflux).     potassium chloride  SA (KLOR-CON  M20) 20 MEQ tablet Take 2 tablets (40 mEq total) by mouth 2 (two) times daily. 360 tablet 3   prochlorperazine  (COMPAZINE ) 10 MG tablet Take 1 tablet (10 mg total) by mouth every 6 (six) hours as needed for nausea or vomiting. 60 tablet 2   trifluridine -tipiracil  (LONSURF ) 20-8.19 MG tablet Take 3 tablets (60 mg of trifluridine  total) by mouth 2 (two) times daily after a meal. Take within 1 hr after AM & PM meals on days 1-5, 8-12. Repeat every 28 days. 60 tablet 2   No current facility-administered medications for this visit.    REVIEW OF SYSTEMS:   Constitutional: Denies fevers, chills or abnormal weight loss Eyes: Denies blurriness of vision Ears, nose, mouth, throat, and face: Denies mucositis or sore throat Respiratory: Denies cough, dyspnea  or wheezes Cardiovascular: Denies palpitation, chest discomfort or lower extremity swelling Gastrointestinal:  Denies nausea, heartburn or change in bowel habits Skin: Denies abnormal skin rashes Lymphatics: Denies new lymphadenopathy or easy bruising Neurological:Denies numbness, tingling or new weaknesses Behavioral/Psych: Mood is stable, no new changes  All other systems were reviewed with the patient and are negative.   VITALS:  Blood pressure 135/84, pulse 74, temperature 97.7 F (36.5 C), temperature source Tympanic, resp. rate 18, weight 166 lb 0.1 oz (75.3 kg), SpO2 100%.  Wt Readings from Last 3 Encounters:  10/10/23 166 lb 0.1 oz (75.3 kg)  09/25/23 169 lb 15.6 oz (77.1 kg)  09/11/23 168 lb (76.2 kg)    Body mass index is 27.62 kg/m.  Performance status (ECOG): 0 - Asymptomatic  PHYSICAL EXAM:   GENERAL:alert, no distress and comfortable SKIN: skin color, texture, turgor are normal, no rashes or significant lesions LYMPH:  no palpable lymphadenopathy in the cervical, axillary or inguinal LUNGS: clear to auscultation and percussion with normal breathing effort HEART: regular rate & rhythm and no murmurs and no lower extremity edema ABDOMEN:abdomen soft, non-tender and normal bowel sounds, left colostomy bag present Musculoskeletal:no cyanosis of digits and no clubbing  NEURO:  alert & oriented x 3 with fluent speech, no focal motor/sensory deficits  LABORATORY DATA:  I have reviewed the data as listed  Lab Results  Component Value Date   WBC 3.5 (L) 10/10/2023   NEUTROABS 1.3 (L) 10/10/2023   HGB 9.0 (L) 10/10/2023   HCT 26.6 (L) 10/10/2023   MCV 101.5 (H) 10/10/2023   PLT 188 10/10/2023     Chemistry      Component Value Date/Time   NA 137 10/10/2023 0928   K 3.5 10/10/2023 0928   CL 110 10/10/2023 0928   CO2 19 (L) 10/10/2023 0928   BUN 8 10/10/2023 0928   CREATININE 0.70 10/10/2023 0928      Component Value Date/Time   CALCIUM  8.5 (L) 10/10/2023  0928   ALKPHOS 56 10/10/2023 0928   AST 17 10/10/2023 0928   ALT 10 10/10/2023 0928   BILITOT 0.6 10/10/2023 0928       Latest Reference Range & Units 10/10/23 09:28  Iron  28 - 170 ug/dL 83  UIBC ug/dL 828  TIBC 749 - 549 ug/dL 745  Saturation Ratios 10.4 - 31.8 % 33 (H)  Ferritin 11 - 307 ng/mL 176  Folate >5.9 ng/mL 16.2  Vitamin B12 180 - 914 pg/mL 543  (H): Data is abnormally high   Latest Reference Range & Units 09/11/23 10:27  CEA 0.0 - 4.7 ng/mL 306.0 (H)  (H): Data is abnormally high  RADIOGRAPHIC STUDIES: I have personally reviewed the radiological images as listed and agreed with the findings in the report.  CT CHEST ABDOMEN PELVIS W CONTRAST CLINICAL DATA:  Metastatic disease evaluation. Colorectal carcinoma. Surveillance exam. * Tracking Code: BO *  EXAM: CT CHEST, ABDOMEN, AND PELVIS WITH CONTRAST  TECHNIQUE: Multidetector CT imaging of the chest, abdomen and pelvis was performed following the standard protocol during bolus administration of intravenous contrast.  RADIATION DOSE REDUCTION: This exam was performed according to the departmental dose-optimization program which includes automated exposure control, adjustment of the mA and/or kV according to patient size and/or use of iterative reconstruction technique.  CONTRAST:  OMNIPAQUE  IOHEXOL  300 MG/ML  SOLN  COMPARISON:  CT 06/07/2023  FINDINGS: CT CHEST FINDINGS  Cardiovascular: Port in the anterior chest wall with tip in distal SVC. No significant vascular findings. Normal heart size. No pericardial effusion.  Mediastinum/Nodes: No axillary or supraclavicular adenopathy. No mediastinal or hilar adenopathy. No pericardial fluid. Esophagus normal.  Lungs/Pleura: No suspicious pulmonary nodules. Normal pleural. Airways normal.  Musculoskeletal: No aggressive osseous lesion.  CT ABDOMEN AND PELVIS FINDINGS  Hepatobiliary: No focal hepatic lesion. Postcholecystectomy. No biliary  dilatation.  Pancreas: Pancreas is normal. No ductal dilatation. No pancreatic inflammation.  Spleen: Normal spleen  Adrenals/urinary tract: Adrenal glands and kidneys are normal. The ureters and bladder normal.  Stomach/Bowel: Stomach, small bowel, appendix, and cecum are normal. Ascending transverse colon normal. End colostomy from the descending colon through LEFT abdominal ostomy. Hartmann's pouch noted. No evidence of cancer recurrence within colon.  Soft tissue nodularity in the sigmoid mesocolon the level of the LEFT common iliac artery measuring 17 x 18 mm compares to 15 x 21 mm remeasured. No significant change. Trace peritoneal thickening along the LEFT pericolic gutter slightly improved. No new peritoneal nodularity.  Large multi cystic mass with enhancing septation and mural nodularity is again demonstrated within the RIGHT lower quadrant. Mass measures 15.6 by 12.0 cm compared to 15.3 by 11.6 cm. Solid nodular enhancing portion along the inferior margin of the cystic mass measures 6.2 x  5.0 cm compared to 6.7 by 5.5 cm. Overall minimal interval change in the cystic complex.  Vascular/Lymphatic: Abdominal aorta is normal caliber. There is no retroperitoneal or periportal lymphadenopathy. No pelvic lymphadenopathy.  Reproductive: Multi lobular uterus consistent leiomyoma. Submucosal leiomyoma extends into endometrial canal. There exophytic calcified leiomyoma. Pattern unchanged from comparison exam  Other: No free-fluid.  No peritoneal nodularity.  IMPRESSION: CHEST:  No evidence of thoracic metastasis.  PELVIS:  1. Stable soft tissue nodularity in the sigmoid mesocolon. Slight improvement in peritoneal nodularity along the LEFT pericolic gutter. 2. No significant change in large complex cystic mass in the RIGHT lower quadrant presumably originating from the RIGHT ovary. 3. LEFT colostomy without complication. 4. Stable leiomyomatous  uterus.  Electronically Signed   By: Jackquline Boxer M.D.   On: 09/07/2023 13:53

## 2023-10-10 NOTE — Assessment & Plan Note (Signed)
 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

## 2023-10-10 NOTE — Assessment & Plan Note (Signed)
 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 S/p first-line treatment with FOLFOXIRI and panitumumab  progression Second line-FOLFOX plus panitumumab  Third line-Lonsurf  plus bevacizumab   - Patient tolerating Lonsurf  and bevacizumab  well.  She reports no complaints from it today - Scheduled for day 1 cycle 5 tomorrow. - We reviewed the last CT scan findings in detail.  Discussed repeating the CT scan sooner with elevation CEA recently and will be done prior to cycle 6 -CEA continuing to rise, will keep a close eye - Labs reviewed today: CMP: Normal creatinine, normal potassium and magnesium , normal LFTs.  CBC: WBC: 3.5, hemoglobin: 9, platelets: 188.  UA with no proteinuria - Can proceed with chemotherapy tomorrow   Return to clinic prior to cycle 6 with a CT scan for follow-up and next infusion

## 2023-10-11 ENCOUNTER — Inpatient Hospital Stay

## 2023-10-11 ENCOUNTER — Other Ambulatory Visit: Payer: Self-pay

## 2023-10-11 VITALS — BP 123/76 | HR 72 | Temp 98.5°F | Resp 18

## 2023-10-11 DIAGNOSIS — Z5112 Encounter for antineoplastic immunotherapy: Secondary | ICD-10-CM | POA: Diagnosis not present

## 2023-10-11 DIAGNOSIS — Z95828 Presence of other vascular implants and grafts: Secondary | ICD-10-CM

## 2023-10-11 DIAGNOSIS — C189 Malignant neoplasm of colon, unspecified: Secondary | ICD-10-CM

## 2023-10-11 MED ORDER — SODIUM CHLORIDE 0.9 % IV SOLN
5.0000 mg/kg | Freq: Once | INTRAVENOUS | Status: AC
  Administered 2023-10-11: 400 mg via INTRAVENOUS
  Filled 2023-10-11: qty 16

## 2023-10-11 MED ORDER — SODIUM CHLORIDE 0.9 % IV SOLN: INTRAVENOUS | Status: DC

## 2023-10-11 NOTE — Patient Instructions (Signed)
 [facility] [place] - A DEPT OF [facility]  Discharge Instructions: Thank you for choosing [facility] to provide your oncology and hematology care.  If you have a lab appointment with the Cancer Center - please note that after April 8th, 2024, all labs will be drawn in the cancer center.  You do not have to check in or register with the main entrance as you have in the past but will complete your check-in in the cancer center.  Wear comfortable clothing and clothing appropriate for easy access to any Portacath or PICC line.   We strive to give you quality time with your provider. You may need to reschedule your appointment if you arrive late (15 or more minutes).  Arriving late affects you and other patients whose appointments are after yours.  Also, if you miss three or more appointments without notifying the office, you may be dismissed from the clinic at the provider's discretion.      For prescription refill requests, have your pharmacy contact our office and allow 72 hours for refills to be completed.    Today you received the following chemotherapy and/or immunotherapy agents Vegzelma.  Bevacizumab Injection What is this medication? BEVACIZUMAB (be va SIZ yoo mab) treats some types of cancer. It works by blocking a protein that causes cancer cells to grow and multiply. This helps to slow or stop the spread of cancer cells. It is a monoclonal antibody. This medicine may be used for other purposes; ask your health care provider or pharmacist if you have questions. COMMON BRAND NAME(S): Alymsys, Avastin, MVASI, [place] Lao What should I tell my care team before I take this medication? They need to know if you have any of these conditions: Blood clots Coughing up blood Having or recent surgery Heart failure High blood pressure History of a connection between 2 or more body parts that do not usually connect (fistula) History of a tear in your  stomach or intestines Protein in your urine An unusual or allergic reaction to bevacizumab, other medications, foods, dyes, or preservatives Pregnant or trying to get pregnant Breast-feeding How should I use this medication? This medication is injected into a vein. It is given by your care team in a hospital or clinic setting. Talk to your care team the use of this medication in children. Special care may be needed. Overdosage: If you think you have taken too much of this medicine contact a poison control center or emergency room at once. NOTE: This medicine is only for you. Do not share this medicine with others. What if I miss a dose? Keep appointments for follow-up doses. It is important not to miss your dose. Call your care team if you are unable to keep an appointment. What may interact with this medication? Interactions are not expected. This list may not describe all possible interactions. Give your health care provider a list of all the medicines, herbs, non-prescription drugs, or dietary supplements you use. Also tell them if you smoke, drink alcohol, or use illegal drugs. Some items may interact with your medicine. What should I watch for while using this medication? Your condition will be monitored carefully while you are receiving this medication. You may need blood work while taking this medication. This medication may make you feel generally unwell. This is not uncommon as chemotherapy can affect healthy cells as well as cancer cells. Report any side effects. Continue your course of treatment even though you feel  ill unless your care team tells you to stop. This medication may increase your risk to bruise or bleed. Call your care team if you notice any unusual bleeding. Before having surgery, talk to your care team to make sure it is ok. This medication can increase the risk of poor healing of your surgical site or wound. You will need to stop this medication for 28 days before  surgery. After surgery, wait at least 28 days before restarting this medication. Make sure the surgical site or wound is healed enough before restarting this medication. Talk to your care team if questions. Talk to your care team if you may be pregnant. Serious birth defects can occur if you take this medication during pregnancy and for 6 months after the last dose. Contraception is recommended while taking this medication and for 6 months after the last dose. Your care team can help you find the option that works for you. Do not breastfeed while taking this medication and for 6 months after the last dose. This medication can cause infertility. Talk to your care team if you are concerned about your fertility. What side effects may I notice from receiving this medication? Side effects that you should report to your care team as soon as possible: Allergic reactions--skin rash, itching, hives, swelling of the face, lips, tongue, or throat Bleeding--bloody or black, tar-like stools, vomiting blood or Diana Fox material that looks like coffee grounds, red or dark Sylvain Hasten urine, small red or purple spots on skin, unusual bruising or bleeding Blood clot--pain, swelling, or warmth in the leg, shortness of breath, chest pain Heart attack--pain or tightness in the chest, shoulders, arms, or jaw, nausea, shortness of breath, cold or clammy skin, feeling faint or lightheaded Heart failure--shortness of breath, swelling of the ankles, feet, or hands, sudden weight gain, unusual weakness or fatigue Increase in blood pressure Infection--fever, chills, cough, sore throat, wounds that don't heal, pain or trouble when passing urine, general feeling of discomfort or being unwell Infusion reactions--chest pain, shortness of breath or trouble breathing, feeling faint or lightheaded Kidney injury--decrease in the amount of urine, swelling of the ankles, hands, or feet Stomach pain that is severe, does not go away, or gets  worse Stroke--sudden numbness or weakness of the face, arm, or leg, trouble speaking, confusion, trouble walking, loss of balance or coordination, dizziness, severe headache, change in vision Sudden and severe headache, confusion, change in vision, seizures, which may be signs of posterior reversible encephalopathy syndrome (PRES) Side effects that usually do not require medical attention (report to your care team if they continue or are bothersome): Back pain Change in taste Diarrhea Dry skin Increased tears Nosebleed This list may not describe all possible side effects. Call your doctor for medical advice about side effects. You may report side effects to FDA at 1-800-FDA-1088. Where should I keep my medication? This medication is given in a hospital or clinic. It will not be stored at home. NOTE: This sheet is a summary. It may not cover all possible information. If you have questions about this medicine, talk to your doctor, pharmacist, or health care provider.  2024 Elsevier/Gold Standard (2021-06-11 00:00:00)       To help prevent nausea and vomiting after your treatment, we encourage you to take your nausea medication as directed.  BELOW ARE SYMPTOMS THAT SHOULD BE REPORTED IMMEDIATELY: *FEVER GREATER THAN 100.4 F (38 C) OR HIGHER *CHILLS OR SWEATING *NAUSEA AND VOMITING THAT IS NOT CONTROLLED WITH YOUR NAUSEA MEDICATION *UNUSUAL SHORTNESS  OF BREATH *UNUSUAL BRUISING OR BLEEDING *URINARY PROBLEMS (pain or burning when urinating, or frequent urination) *BOWEL PROBLEMS (unusual diarrhea, constipation, pain near the anus) TENDERNESS IN MOUTH AND THROAT WITH OR WITHOUT PRESENCE OF ULCERS (sore throat, sores in mouth, or a toothache) UNUSUAL RASH, SWELLING OR PAIN  UNUSUAL VAGINAL DISCHARGE OR ITCHING   Items with * indicate a potential emergency and should be followed up as soon as possible or go to the Emergency Department if any problems should occur.  Please show the  CHEMOTHERAPY ALERT CARD or IMMUNOTHERAPY ALERT CARD at check-in to the Emergency Department and triage nurse.  Should you have questions after your visit or need to cancel or reschedule your appointment, please contact [facility] [place] - A DEPT OF Eligha Brideg[facility] [phone]  and follow the prompts.  Office hours are 8:00 a.m. to 4:30 p.m. Monday - Friday. Please note that voicemails left after 4:00 p.m. may not be returned until the following business day.  We are closed weekends and major holidays. You have access to a nurse at all times for urgent questions. Please call the main number to the clinic [phone] and follow the prompts.  For any non-urgent questions, you may also contact your provider using MyChart. We now offer e-Visits for anyone 92 and older to request care online for non-urgent symptoms. For details visit [email or web address].   Also download the MyChart app! Go to the app store, search "MyChart", open the app, select , and log in with your MyChart username and password.

## 2023-10-11 NOTE — Progress Notes (Signed)
   10/11/23 1000  Spiritual Encounters  Type of Visit Follow up  Care provided to: Patient  Conversation partners present during encounter Nurse  Referral source Chaplain assessment  Reason for visit Routine spiritual support  OnCall Visit No  Spiritual Framework  Presenting Themes Impactful experiences and emotions;Courage hope and growth;Goals in life/care  Patient Stress Factors Exhausted (New Student)  Family Stress Factors Not reviewed  Interventions  Spiritual Care Interventions Made Compassionate presence;Reflective listening;Encouragement;Prayer  Intervention Outcomes  Outcomes Awareness of support  Spiritual Care Plan  Spiritual Care Issues Still Outstanding Chaplain will continue to follow   Reason for Visit: Chaplain making scheduled follow-up with Pt I previously connected with.  Description of Visit: I found Diana Fox in the infusion room receiving her treatment and she was pleased to talk with me.  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  Diana Fox response was, "Better now." I encouraged her to tell me more.  She stated that the first week of classes was overwhelming for her both mentally and physically and she questioned her decision to return to school at her age.  Since then, however, she has found her groove and is handling things much better, though she does still struggle with some fatigue.  When I asked her how I could pray for her she mentioned that she needed strength today as she was exhausted.  Diana Fox continues to move forward with her educational goals, though she dose seem less enthusiastic than she previously was.  I suspect, however, that this may be be due to her present weariness.  Plan of Care: I will follow up with Pt on a monthly basis.   Diana Fox, MDiv  Chaplain, [facility] [email or web address] [phone]

## 2023-10-11 NOTE — Progress Notes (Signed)
 Patient presents today for chemotherapy infusion. Patient is in satisfactory condition with no new complaints voiced.  Vital signs are stable.  Labs reviewed by Dr. Davonna on 10/10/23 during the office visit. ANC on 10/10/23 was 1.3.  MD aware.  All other labs are within treatment parameters.  We will proceed with treatment per MD orders.   Patient tolerated treatment well with no complaints voiced.  Patient left ambulatory in stable condition.  Vital signs stable at discharge.  Follow up as scheduled.

## 2023-10-19 ENCOUNTER — Encounter: Payer: Self-pay | Admitting: Hematology

## 2023-10-23 ENCOUNTER — Inpatient Hospital Stay

## 2023-10-24 ENCOUNTER — Telehealth: Payer: Self-pay | Admitting: *Deleted

## 2023-10-24 ENCOUNTER — Encounter: Payer: Self-pay | Admitting: Oncology

## 2023-10-24 ENCOUNTER — Encounter (HOSPITAL_COMMUNITY): Payer: Self-pay | Admitting: Oncology

## 2023-10-24 NOTE — Telephone Encounter (Signed)
 Received call from patient [phone]~ telephone.   Patient reports that she has noted bleeding to the stoma again. Per providers note in 2024, patient is to follow up if bleeding resumed.   Appointment scheduled.

## 2023-10-25 ENCOUNTER — Inpatient Hospital Stay

## 2023-10-25 ENCOUNTER — Other Ambulatory Visit: Payer: Self-pay | Admitting: Oncology

## 2023-10-25 VITALS — BP 109/68 | HR 72 | Temp 97.0°F | Resp 17

## 2023-10-25 DIAGNOSIS — Z5112 Encounter for antineoplastic immunotherapy: Secondary | ICD-10-CM | POA: Diagnosis not present

## 2023-10-25 DIAGNOSIS — Z95828 Presence of other vascular implants and grafts: Secondary | ICD-10-CM

## 2023-10-25 DIAGNOSIS — C189 Malignant neoplasm of colon, unspecified: Secondary | ICD-10-CM

## 2023-10-25 LAB — COMPREHENSIVE METABOLIC PANEL WITH GFR
ALT: 9 U/L (ref 0–44)
AST: 15 U/L (ref 15–41)
Albumin: 3.5 g/dL (ref 3.5–5.0)
Alkaline Phosphatase: 48 U/L (ref 38–126)
Anion gap: 11 (ref 5–15)
BUN: 12 mg/dL (ref 6–20)
CO2: 17 mmol/L — ABNORMAL LOW (ref 22–32)
Calcium: 8.5 mg/dL — ABNORMAL LOW (ref 8.9–10.3)
Chloride: 110 mmol/L (ref 98–111)
Creatinine, Ser: 0.69 mg/dL (ref 0.44–1.00)
GFR, Estimated: >60 mL/min (ref >60)
Glucose, Bld: 98 mg/dL (ref 70–99)
Potassium: 3.8 mmol/L (ref 3.5–5.1)
Sodium: 138 mmol/L (ref 135–145)
Total Bilirubin: 0.6 mg/dL (ref 0.0–1.2)
Total Protein: 7.1 g/dL (ref 6.5–8.1)

## 2023-10-25 LAB — CBC WITH DIFFERENTIAL/PLATELET
Abs Immature Granulocytes: 0.02 K/uL (ref 0.00–0.07)
Basophils Absolute: 0 K/uL (ref 0.0–0.1)
Basophils Relative: 0 %
Eosinophils Absolute: 0 K/uL (ref 0.0–0.5)
Eosinophils Relative: 0 %
HCT: 22.1 % — ABNORMAL LOW (ref 36.0–46.0)
Hemoglobin: 7.6 g/dL — ABNORMAL LOW (ref 12.0–15.0)
Immature Granulocytes: 1 %
Lymphocytes Relative: 43 %
Lymphs Abs: 1.4 K/uL (ref 0.7–4.0)
MCH: 34.5 pg — ABNORMAL HIGH (ref 26.0–34.0)
MCHC: 34.4 g/dL (ref 30.0–36.0)
MCV: 100.5 fL — ABNORMAL HIGH (ref 80.0–100.0)
Monocytes Absolute: 0.1 K/uL (ref 0.1–1.0)
Monocytes Relative: 3 %
Neutro Abs: 1.7 K/uL (ref 1.7–7.7)
Neutrophils Relative %: 53 %
Platelets: 156 K/uL (ref 150–400)
RBC: 2.2 MIL/uL — ABNORMAL LOW (ref 3.87–5.11)
RDW: 16.7 % — ABNORMAL HIGH (ref 11.5–15.5)
WBC: 3.2 K/uL — ABNORMAL LOW (ref 4.0–10.5)
nRBC: 0 % (ref 0.0–0.2)

## 2023-10-25 LAB — PREPARE RBC (CROSSMATCH)

## 2023-10-25 LAB — MAGNESIUM: Magnesium: 1.9 mg/dL (ref 1.7–2.4)

## 2023-10-25 MED ORDER — SODIUM CHLORIDE 0.9 % IV SOLN
5.0000 mg/kg | Freq: Once | INTRAVENOUS | Status: AC
  Administered 2023-10-25: 400 mg via INTRAVENOUS
  Filled 2023-10-25: qty 16

## 2023-10-25 MED ORDER — SODIUM CHLORIDE 0.9 % IV SOLN: INTRAVENOUS | Status: DC

## 2023-10-25 MED ORDER — SODIUM CHLORIDE 0.9% IV SOLUTION
250.0000 mL | INTRAVENOUS | Status: DC
  Administered 2023-10-25: 100 mL via INTRAVENOUS

## 2023-10-25 MED ORDER — ACETAMINOPHEN 325 MG PO TABS
650.0000 mg | ORAL_TABLET | Freq: Once | ORAL | Status: AC
  Administered 2023-10-25: 650 mg via ORAL
  Filled 2023-10-25: qty 2

## 2023-10-25 MED ORDER — DIPHENHYDRAMINE HCL 25 MG PO CAPS
25.0000 mg | ORAL_CAPSULE | Freq: Once | ORAL | Status: AC
  Administered 2023-10-25: 25 mg via ORAL
  Filled 2023-10-25: qty 1

## 2023-10-25 NOTE — Progress Notes (Signed)
 Patient will receive 1 unit of PRBC's today. Tylenol  650 mg and Benadryl  25 mg given PO for blood products.   Vegzelma  given today per MD orders. Tolerated infusion without adverse affects. Vital signs stable. No concerns voiced at this time.   Treatment given today per MD orders. Tolerated infusion without adverse affects. Vital signs stable. No complaints at this time. Discharged from clinic ambulatory in stable condition. Alert and oriented x 3. F/U with [facility] as scheduled.

## 2023-10-25 NOTE — Patient Instructions (Signed)
 [facility] [place] - A DEPT OF [facility]  Discharge Instructions: Thank you for choosing [facility] to provide your oncology and hematology care.  If you have a lab appointment with the Cancer Center - please note that after April 8th, 2024, all labs will be drawn in the cancer center.  You do not have to check in or register with the main entrance as you have in the past but will complete your check-in in the cancer center.  Wear comfortable clothing and clothing appropriate for easy access to any Portacath or PICC line.   We strive to give you quality time with your provider. You may need to reschedule your appointment if you arrive late (15 or more minutes).  Arriving late affects you and other patients whose appointments are after yours.  Also, if you miss three or more appointments without notifying the office, you may be dismissed from the clinic at the provider's discretion.      For prescription refill requests, have your pharmacy contact our office and allow 72 hours for refills to be completed.    Today you received the following chemotherapy and/or immunotherapy agents 1 unit of blood and Vegzelma .  Bevacizumab  Injection What is this medication? BEVACIZUMAB  (be va SIZ yoo mab) treats some types of cancer. It works by blocking a protein that causes cancer cells to grow and multiply. This helps to slow or stop the spread of cancer cells. It is a monoclonal antibody. This medicine may be used for other purposes; ask your health care provider or pharmacist if you have questions. COMMON BRAND NAME(S): Alymsys , Avastin , MVASI , Vegzalma, Zirabev  What should I tell my care team before I take this medication? They need to know if you have any of these conditions: Blood clots Coughing up blood Having or recent surgery Heart failure High blood pressure History of a connection between 2 or more body parts that do not usually connect (fistula) History  of a tear in your stomach or intestines Protein in your urine An unusual or allergic reaction to bevacizumab , other medications, foods, dyes, or preservatives Pregnant or trying to get pregnant Breast-feeding How should I use this medication? This medication is injected into a vein. It is given by your care team in a hospital or clinic setting. Talk to your care team the use of this medication in children. Special care may be needed. Overdosage: If you think you have taken too much of this medicine contact a poison control center or emergency room at once. NOTE: This medicine is only for you. Do not share this medicine with others. What if I miss a dose? Keep appointments for follow-up doses. It is important not to miss your dose. Call your care team if you are unable to keep an appointment. What may interact with this medication? Interactions are not expected. This list may not describe all possible interactions. Give your health care provider a list of all the medicines, herbs, non-prescription drugs, or dietary supplements you use. Also tell them if you smoke, drink alcohol, or use illegal drugs. Some items may interact with your medicine. What should I watch for while using this medication? Your condition will be monitored carefully while you are receiving this medication. You may need blood work while taking this medication. This medication may make you feel generally unwell. This is not uncommon as chemotherapy can affect healthy cells as well as cancer cells. Report any side effects. Continue your course of  treatment even though you feel ill unless your care team tells you to stop. This medication may increase your risk to bruise or bleed. Call your care team if you notice any unusual bleeding. Before having surgery, talk to your care team to make sure it is ok. This medication can increase the risk of poor healing of your surgical site or wound. You will need to stop this medication for 28  days before surgery. After surgery, wait at least 28 days before restarting this medication. Make sure the surgical site or wound is healed enough before restarting this medication. Talk to your care team if questions. Talk to your care team if you may be pregnant. Serious birth defects can occur if you take this medication during pregnancy and for 6 months after the last dose. Contraception is recommended while taking this medication and for 6 months after the last dose. Your care team can help you find the option that works for you. Do not breastfeed while taking this medication and for 6 months after the last dose. This medication can cause infertility. Talk to your care team if you are concerned about your fertility. What side effects may I notice from receiving this medication? Side effects that you should report to your care team as soon as possible: Allergic reactions--skin rash, itching, hives, swelling of the face, lips, tongue, or throat Bleeding--bloody or black, tar-like stools, vomiting blood or brown material that looks like coffee grounds, red or dark brown urine, small red or purple spots on skin, unusual bruising or bleeding Blood clot--pain, swelling, or warmth in the leg, shortness of breath, chest pain Heart attack--pain or tightness in the chest, shoulders, arms, or jaw, nausea, shortness of breath, cold or clammy skin, feeling faint or lightheaded Heart failure--shortness of breath, swelling of the ankles, feet, or hands, sudden weight gain, unusual weakness or fatigue Increase in blood pressure Infection--fever, chills, cough, sore throat, wounds that don't heal, pain or trouble when passing urine, general feeling of discomfort or being unwell Infusion reactions--chest pain, shortness of breath or trouble breathing, feeling faint or lightheaded Kidney injury--decrease in the amount of urine, swelling of the ankles, hands, or feet Stomach pain that is severe, does not go away, or  gets worse Stroke--sudden numbness or weakness of the face, arm, or leg, trouble speaking, confusion, trouble walking, loss of balance or coordination, dizziness, severe headache, change in vision Sudden and severe headache, confusion, change in vision, seizures, which may be signs of posterior reversible encephalopathy syndrome (PRES) Side effects that usually do not require medical attention (report to your care team if they continue or are bothersome): Back pain Change in taste Diarrhea Dry skin Increased tears Nosebleed This list may not describe all possible side effects. Call your doctor for medical advice about side effects. You may report side effects to FDA at 1-800-FDA-1088. Where should I keep my medication? This medication is given in a hospital or clinic. It will not be stored at home. NOTE: This sheet is a summary. It may not cover all possible information. If you have questions about this medicine, talk to your doctor, pharmacist, or health care provider.  2024 Elsevier/Gold Standard (2021-06-11 00:00:00)      To help prevent nausea and vomiting after your treatment, we encourage you to take your nausea medication as directed.  BELOW ARE SYMPTOMS THAT SHOULD BE REPORTED IMMEDIATELY: *FEVER GREATER THAN 100.4 F (38 C) OR HIGHER *CHILLS OR SWEATING *NAUSEA AND VOMITING THAT IS NOT CONTROLLED WITH YOUR  NAUSEA MEDICATION *UNUSUAL SHORTNESS OF BREATH *UNUSUAL BRUISING OR BLEEDING *URINARY PROBLEMS (pain or burning when urinating, or frequent urination) *BOWEL PROBLEMS (unusual diarrhea, constipation, pain near the anus) TENDERNESS IN MOUTH AND THROAT WITH OR WITHOUT PRESENCE OF ULCERS (sore throat, sores in mouth, or a toothache) UNUSUAL RASH, SWELLING OR PAIN  UNUSUAL VAGINAL DISCHARGE OR ITCHING   Items with * indicate a potential emergency and should be followed up as soon as possible or go to the Emergency Department if any problems should occur.  Please show the  CHEMOTHERAPY ALERT CARD or IMMUNOTHERAPY ALERT CARD at check-in to the Emergency Department and triage nurse.  Should you have questions after your visit or need to cancel or reschedule your appointment, please contact [facility] [place] - A DEPT OF [facility] [phone]  and follow the prompts.  Office hours are 8:00 a.m. to 4:30 p.m. Monday - Friday. Please note that voicemails left after 4:00 p.m. may not be returned until the following business day.  We are closed weekends and major holidays. You have access to a nurse at all times for urgent questions. Please call the main number to the clinic [phone] and follow the prompts.  For any non-urgent questions, you may also contact your provider using MyChart. We now offer e-Visits for anyone 21 and older to request care online for non-urgent symptoms. For details visit [email or web address].   Also download the MyChart app! Go to the app store, search MyChart, open the app, select [place], and log in with your MyChart username and password.

## 2023-10-25 NOTE — Progress Notes (Signed)
 Ok to treat today per Md, and will give one unit of blood today per MD as well for hemoglobin of 7.6

## 2023-10-26 ENCOUNTER — Ambulatory Visit (HOSPITAL_COMMUNITY)
Admission: RE | Admit: 2023-10-26 | Discharge: 2023-10-26 | Disposition: A | Discharge status: Home / Self Care | Source: Ambulatory Visit | Attending: Oncology | Admitting: Oncology

## 2023-10-26 DIAGNOSIS — C7989 Secondary malignant neoplasm of other specified sites: Secondary | ICD-10-CM | POA: Diagnosis present

## 2023-10-26 DIAGNOSIS — C189 Malignant neoplasm of colon, unspecified: Secondary | ICD-10-CM | POA: Insufficient documentation

## 2023-10-26 LAB — TYPE AND SCREEN
ABO/RH(D): B POS
Antibody Screen: NEGATIVE
Unit division: 0

## 2023-10-26 LAB — BPAM RBC
Blood Product Expiration Date: 202510132359
ISSUE DATE / TIME: 202509171130
Unit Type and Rh: 1700

## 2023-10-26 MED ORDER — IOHEXOL 300 MG/ML  SOLN
100.0000 mL | Freq: Once | INTRAMUSCULAR | Status: AC | PRN
  Administered 2023-10-26: 100 mL via INTRAVENOUS

## 2023-10-26 MED ORDER — IOHEXOL 9 MG/ML PO SOLN
ORAL | Status: AC
  Filled 2023-10-26: qty 1000

## 2023-10-30 ENCOUNTER — Encounter: Payer: Self-pay | Admitting: Oncology

## 2023-10-30 ENCOUNTER — Encounter (HOSPITAL_COMMUNITY): Payer: Self-pay | Admitting: Oncology

## 2023-10-31 ENCOUNTER — Other Ambulatory Visit: Payer: Self-pay

## 2023-10-31 ENCOUNTER — Ambulatory Visit (INDEPENDENT_AMBULATORY_CARE_PROVIDER_SITE_OTHER): Admitting: General Surgery

## 2023-10-31 ENCOUNTER — Encounter: Payer: Self-pay | Admitting: General Surgery

## 2023-10-31 VITALS — BP 128/81 | HR 73 | Temp 98.3°F | Resp 14 | Ht 65.0 in | Wt 167.0 lb

## 2023-10-31 DIAGNOSIS — Z933 Colostomy status: Secondary | ICD-10-CM

## 2023-10-31 NOTE — Progress Notes (Signed)
 Specialty Pharmacy Refill Coordination Note  Diana Fox is a 53 y.o. female contacted today regarding refills of specialty medication(s) Trifluridine -Tipiracil  (L[place]urf )   Patient requested Delivery   Delivery date: 11/02/23   Verified address: [address]   [place] [address]   Medication will be filled on 11/01/23.

## 2023-10-31 NOTE — Progress Notes (Signed)
 Subjective:     Diana Fox  Patient self-referred herself back to my care for evaluation of her colostomy.  She states that several weeks ago, she had some bright red blood along the lower aspect of her ostomy.  She states this was the first time in over 6 months.  After couple days, it resolved on its own.  She is actively undergoing infusion therapy for metastatic colon cancer.  She has not had any other issues with her colostomy.  She states she changed her ostomy appliance today and no blood was present. Recent oncology visits reviewed Objective:    BP 128/81   Pulse 73   Temp 98.3 F (36.8 C) (Oral)   Resp 14   Ht 5' 5 (1.651 m)   Wt 167 lb (75.8 kg)   SpO2 96%   BMI 27.79 kg/m   General:  alert, cooperative, and no distress  Abdomen is soft, incision well-healed.  Colostomy is pink and patent.  I did not see any granulation tissue or blood on the mucosa present.  Ostomy contents were without blood.     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    Plan:   I told her that should this recur and persist, she can return to my care for evaluation.  She was asking about having a colostomy.  I told her that I would discuss that with Dr. Davonna of Oncology.  Follow-up here as needed.

## 2023-11-02 ENCOUNTER — Other Ambulatory Visit: Payer: Self-pay | Admitting: Oncology

## 2023-11-02 DIAGNOSIS — Z95828 Presence of other vascular implants and grafts: Secondary | ICD-10-CM

## 2023-11-02 DIAGNOSIS — C189 Malignant neoplasm of colon, unspecified: Secondary | ICD-10-CM

## 2023-11-03 ENCOUNTER — Other Ambulatory Visit: Payer: Self-pay

## 2023-11-08 ENCOUNTER — Telehealth: Payer: Self-pay | Admitting: Gastroenterology

## 2023-11-08 ENCOUNTER — Inpatient Hospital Stay: Attending: Hematology

## 2023-11-08 ENCOUNTER — Inpatient Hospital Stay

## 2023-11-08 ENCOUNTER — Inpatient Hospital Stay (HOSPITAL_BASED_OUTPATIENT_CLINIC_OR_DEPARTMENT_OTHER): Admitting: Oncology

## 2023-11-08 DIAGNOSIS — D701 Agranulocytosis secondary to cancer chemotherapy: Secondary | ICD-10-CM | POA: Diagnosis not present

## 2023-11-08 DIAGNOSIS — K56699 Other intestinal obstruction unspecified as to partial versus complete obstruction: Secondary | ICD-10-CM | POA: Diagnosis not present

## 2023-11-08 DIAGNOSIS — Z95828 Presence of other vascular implants and grafts: Secondary | ICD-10-CM

## 2023-11-08 DIAGNOSIS — Z79899 Other long term (current) drug therapy: Secondary | ICD-10-CM | POA: Insufficient documentation

## 2023-11-08 DIAGNOSIS — T451X5A Adverse effect of antineoplastic and immunosuppressive drugs, initial encounter: Secondary | ICD-10-CM | POA: Diagnosis not present

## 2023-11-08 DIAGNOSIS — C189 Malignant neoplasm of colon, unspecified: Secondary | ICD-10-CM

## 2023-11-08 DIAGNOSIS — C786 Secondary malignant neoplasm of retroperitoneum and peritoneum: Secondary | ICD-10-CM | POA: Diagnosis not present

## 2023-11-08 DIAGNOSIS — D539 Nutritional anemia, unspecified: Secondary | ICD-10-CM | POA: Diagnosis not present

## 2023-11-08 DIAGNOSIS — D6481 Anemia due to antineoplastic chemotherapy: Secondary | ICD-10-CM | POA: Insufficient documentation

## 2023-11-08 DIAGNOSIS — Z5112 Encounter for antineoplastic immunotherapy: Secondary | ICD-10-CM | POA: Diagnosis present

## 2023-11-08 DIAGNOSIS — C779 Secondary and unspecified malignant neoplasm of lymph node, unspecified: Secondary | ICD-10-CM | POA: Insufficient documentation

## 2023-11-08 DIAGNOSIS — K648 Other hemorrhoids: Secondary | ICD-10-CM | POA: Diagnosis not present

## 2023-11-08 DIAGNOSIS — C7800 Secondary malignant neoplasm of unspecified lung: Secondary | ICD-10-CM | POA: Insufficient documentation

## 2023-11-08 DIAGNOSIS — Z933 Colostomy status: Secondary | ICD-10-CM | POA: Diagnosis not present

## 2023-11-08 DIAGNOSIS — G622 Polyneuropathy due to other toxic agents: Secondary | ICD-10-CM | POA: Diagnosis not present

## 2023-11-08 DIAGNOSIS — R970 Elevated carcinoembryonic antigen [CEA]: Secondary | ICD-10-CM | POA: Insufficient documentation

## 2023-11-08 DIAGNOSIS — R918 Other nonspecific abnormal finding of lung field: Secondary | ICD-10-CM | POA: Diagnosis not present

## 2023-11-08 DIAGNOSIS — Z793 Long term (current) use of hormonal contraceptives: Secondary | ICD-10-CM | POA: Insufficient documentation

## 2023-11-08 DIAGNOSIS — C187 Malignant neoplasm of sigmoid colon: Secondary | ICD-10-CM | POA: Diagnosis not present

## 2023-11-08 LAB — URINALYSIS, DIPSTICK ONLY
Bilirubin Urine: NEGATIVE
Glucose, UA: NEGATIVE mg/dL
Hgb urine dipstick: NEGATIVE
Ketones, ur: NEGATIVE mg/dL
Leukocytes,Ua: NEGATIVE
Nitrite: NEGATIVE
Protein, ur: NEGATIVE mg/dL
Specific Gravity, Urine: 1.012 (ref 1.005–1.030)
pH: 7 (ref 5.0–8.0)

## 2023-11-08 LAB — COMPREHENSIVE METABOLIC PANEL WITH GFR
ALT: 11 U/L (ref 0–44)
AST: 27 U/L (ref 15–41)
Albumin: 3.9 g/dL (ref 3.5–5.0)
Alkaline Phosphatase: 65 U/L (ref 38–126)
Anion gap: 11 (ref 5–15)
BUN: 7 mg/dL (ref 6–20)
CO2: 19 mmol/L — ABNORMAL LOW (ref 22–32)
Calcium: 9 mg/dL (ref 8.9–10.3)
Chloride: 107 mmol/L (ref 98–111)
Creatinine, Ser: 0.73 mg/dL (ref 0.44–1.00)
GFR, Estimated: >60 mL/min (ref >60)
Glucose, Bld: 88 mg/dL (ref 70–99)
Potassium: 4 mmol/L (ref 3.5–5.1)
Sodium: 137 mmol/L (ref 135–145)
Total Bilirubin: 0.6 mg/dL (ref 0.0–1.2)
Total Protein: 7.2 g/dL (ref 6.5–8.1)

## 2023-11-08 LAB — CBC WITH DIFFERENTIAL/PLATELET
Abs Immature Granulocytes: 0.01 K/uL (ref 0.00–0.07)
Basophils Absolute: 0 K/uL (ref 0.0–0.1)
Basophils Relative: 0 %
Eosinophils Absolute: 0 K/uL (ref 0.0–0.5)
Eosinophils Relative: 0 %
HCT: 29.7 % — ABNORMAL LOW (ref 36.0–46.0)
Hemoglobin: 10.1 g/dL — ABNORMAL LOW (ref 12.0–15.0)
Immature Granulocytes: 0 %
Lymphocytes Relative: 40 %
Lymphs Abs: 1.2 K/uL (ref 0.7–4.0)
MCH: 34.6 pg — ABNORMAL HIGH (ref 26.0–34.0)
MCHC: 34 g/dL (ref 30.0–36.0)
MCV: 101.7 fL — ABNORMAL HIGH (ref 80.0–100.0)
Monocytes Absolute: 0.5 K/uL (ref 0.1–1.0)
Monocytes Relative: 16 %
Neutro Abs: 1.3 K/uL — ABNORMAL LOW (ref 1.7–7.7)
Neutrophils Relative %: 44 %
Platelets: 193 K/uL (ref 150–400)
RBC: 2.92 MIL/uL — ABNORMAL LOW (ref 3.87–5.11)
RDW: 18.6 % — ABNORMAL HIGH (ref 11.5–15.5)
WBC: 3 K/uL — ABNORMAL LOW (ref 4.0–10.5)
nRBC: 0 % (ref 0.0–0.2)

## 2023-11-08 LAB — MAGNESIUM: Magnesium: 1.9 mg/dL (ref 1.7–2.4)

## 2023-11-08 LAB — SAMPLE TO BLOOD BANK

## 2023-11-08 MED ORDER — SODIUM CHLORIDE 0.9 % IV SOLN
5.0000 mg/kg | Freq: Once | INTRAVENOUS | Status: DC
  Filled 2023-11-08: qty 16

## 2023-11-08 MED ORDER — SODIUM CHLORIDE 0.9 % IV SOLN: INTRAVENOUS | Status: DC

## 2023-11-08 NOTE — Progress Notes (Signed)
 Patient presents today for Vegzelma  treatment and follow up with Dr. Davonna. ANC 1.3. Labs reviewed by Dr. Davonna . Message received to proceed with treatment .   Patient rescheduled Vegzelma  for 08:00 am on 11-09-2023. 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 Patient requested to leave and receive treatment tomorrow. Pharmacy notified and aware. Discharged from clinic ambulatory in stable condition. Alert and oriented x 3. F/U with [facility] as scheduled.

## 2023-11-08 NOTE — Progress Notes (Signed)
 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 S/p first-line treatment with FOLFOXIRI and panitumumab  progression Second line-FOLFOX plus panitumumab  Third line-Lonsurf  plus bevacizumab    - Patient tolerating Lonsurf  and bevacizumab  well.  She reports no complaints from it today - Scheduled for cycle 6-day 1 today.  Physical exam and labs stable.  Will proceed with chemotherapy today. - We reviewed the CT scan together.  Stable findings currently.  Will repeat in 3 months.  Due 01/2024 -CEA continuing to rise, will keep a close eye.  Will repeat with every cycle. - Labs reviewed today: CMP: Normal creatinine, normal potassium and magnesium , normal LFTs.  CBC: WBC: 3.0, hemoglobin: 10.1, platelets: 193.  UA with no proteinuria - Continue Lonsurf  60 mg 2 times daily on days 1-5, 8-12 of a 28-day cycle  Return to clinic in 6 weeks for follow up  Chemotherapy-induced anemia Anemia persists with hemoglobin at 10.1, improved from 7.6. Attributed to Lonsurf ; other nutritional causes ruled out.  Chemotherapy-induced leukopenia Low white blood cell count due to Lonsurf , under close monitoring.  Colostomy status She inquired about colonoscopy for potential colostomy reversal. Plan to consult gastroenterologist. - Contact Charmaine Melia, NP to schedule a colonoscopy consultation.  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  She knows to contact our office if she develops concerns prior to her next appointment.  The total time spent in the appointment was 40 minutes for the encounter  with patient, including review of chart and various tests results, discussions about plan of care and coordination of care plan  Mickiel Dry, MD  [facility] [place] - A DEPT OF [facility] [address] [place] [address] Dept: [phone] Dept Fax: [phone]   No orders of the defined types were placed in this encounter.    ONCOLOGY HISTORY:   I have reviewed her chart and materials related to her cancer extensively and collaborated history with the patient. 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 There are bilateral mildly enlarged common iliac chain nodes associated with this. There are mildly dilated mid to lower abdominal small bowel segments up to 3.2 cm without visible transition and decompressed but occasionally thickened small bowel segments in the right lower abdomen. -03/02/2021: Colonoscopy found: Nonbleeding internal hemorrhoids, severe stenosis in the sigmoid colon at approximately 18 cm from anal verge.  Pathology of sigmoid colon: Adenocarcinoma, moderately to poorly differentiated.  -03/24/2021: CEA  61.5.  -03/26/2021: Sigmoid colon resection, partial small bowel resection and  Hartman's procedure. Tumor extended outside the colon to the pelvic brim and the surgeon was unable to fully excise all of the tumor in the pelvis.  Pathology: Colonic adenocarcinoma, moderately to poorly differentiated, involving the subserosal adipose tissue and present at the margin. Small bowel resection positive for adenocarcinoma nodules in the serosal fat. 9 out of 9 lymph nodes positive for metastatic carcinoma (9/9). MMR preserved. pT4, pN2b  -03/29/2021: NGS: KRAS/NRAS negative, BRAF negative, HER2 IHC (0) negative, MSI-stable, PDL1 : 0, Negative, POLE: Negative, PTEN: IHC: positive 1+, 100%. -04/29/2021: PET: Multifocal hypermetabolic nodal, peritoneal and anterior abdominal wall hypermetabolic activity. Based on the presence of metastatic disease within lymph nodes and along the serosal surface of the small bowel at time of the patient's prior surgery, these findings are worrisome for progressive nodal metastatic disease and peritoneal carcinomatosis. No evidence of hepatic metastatic disease or other distant metastases. -05/17/2021-11/2021: FOLFOXIRI and Panitumumab , oxaliplatin  discontinued after 10 cycles -11/2021-12/29/2022: FOLFIRI and Panitumumab , discontinued due to progression -01/10/2023: CT CAP: Interval enlargement of a heterogeneous mixed solid and cystic mass arising from the right adnexa, consistent with worsened ovarian metastatic disease. Interval enlargement of a soft tissue mass in the sigmoid mesentery consistent with worsened mesenteric soft tissue or nodal metastatic disease. -10/04/2021: Colonoscopy negative for malignancy.  -01/18/2023-05/31/2023: FOLFOX and bevacizumab , discontinued due to progression -06/07/2023: CT CAP: Interval enlargement of the complex mixed cystic and solid mass arising from the right adnexa. Interval enlargement of the soft tissue mass/lymph node in the sigmoid mesentery along the left common iliac artery. -06/16/2023: Guardant360: GNAS, T p53,  APC, MSI high not detected, TMB-low.  -06/19/2023-current: Lonsurf  and bevacizumab   -09/05/2023: CT CAP: Stable soft tissue nodularity in the sigmoid mesocolon. Slight improvement in peritoneal nodularity along the LEFT pericolic gutter.  No significant change in large complex cystic mass in the RIGHT lower quadrant presumably originating from the RIGHT ovary. -09/11/2023: CEA: 306 - 10/26/2023: CT CAP: No significant change in a bulky multicystic mass arising from the right ovary, measuring 14.4 x 11.9 cm. Unchanged metastatic soft tissue mass or lymph node partially encasing the inferior mesenteric artery in the sigmoid mesocolon measuring 1.9 x 1.8 cm. Unchanged tiny pulmonary nodules, nonspecific although most likely benign and incidental.   Current Treatment:  Lonsurf  and bevacizumab    INTERVAL HISTORY:   Discussed the use of AI scribe software for clinical note transcription with the patient, who gave verbal consent to proceed.  History of Present Illness Diana Fox is a 53 year old female with colon cancer who presents for follow-up of her colon carcinoma  She is currently undergoing treatment for colon cancer. Her carcinoembryonic antigen (CEA) levels are slightly increasing, and her white blood cell count is low.  Her treatment plan includes Lonsurf , taken on days one to five and eight to twelve of a two-week cycle, followed by a two-week break. Additionally, she receives Avastin  every two weeks. Her blood work shows mild anemia, with a hemoglobin level of 10.1. She previously had a hemoglobin level of 7.6. Other lab tests for anemia causes, including iron , vitamin B12, and folate levels, were performed.  The numbness and tingling in her hands and feet have resolved. No diarrhea, nausea, vomiting, or any other complaints at this time.  She inquires about the possibility of scheduling a colonoscopy to assess the potential for reversing her ostomy, as she feels 'something wants to  come out.' She mentions a previous consultation  regarding stomach bleeding, which was thought to be due to irritation.  Socially, she is currently attending nursing school.   I have reviewed the past medical history, past surgical history, social history and family history with the patient and they are unchanged from previous note.  ALLERGIES:  is allergic to zithromax  [azithromycin ], motrin [ibuprofen], sudafed [pseudoephedrine  hcl], and zestril [lisinopril].  MEDICATIONS:  Current Outpatient Medications  Medication Sig Dispense Refill   amLODipine  (NORVASC ) 10 MG tablet TAKE 1 TABLET BY MOUTH EVERY DAY 90 tablet 1   cetirizine (ZYRTEC) 10 MG tablet SMARTSIG:1 Tablet(s) By Mouth Every Evening     fexofenadine  (ALLEGRA ) 180 MG tablet Take 1 tablet (180 mg total) by mouth daily. 30 tablet 0   fluorouracil  CALGB 19297 2,400 mg/m2 in sodium chloride  0.9 % 150 mL Inject 2,400 mg/m2 into the vein over 48 hr. Every 14 days     FLUOROURACIL  IV Inject into the vein every 14 (fourteen) days.     fluticasone  (FLONASE ) 50 MCG/ACT nasal spray Place 2 sprays into both nostrils daily. 16 g 12   magnesium  oxide (MAG-OX) 400 (240 Mg) MG tablet Take 1 tablet (400 mg total) by mouth 2 (two) times daily. 60 tablet 6   medroxyPROGESTERone (DEPO-PROVERA) 150 MG/ML injection Inject 150 mg into the muscle every 3 (three) months.     Multiple Vitamin (MULTIVITAMIN) tablet Take 1 tablet by mouth daily.     nebivolol  (BYSTOLIC ) 10 MG tablet Take 10 mg by mouth daily.     OXALIPLATIN  IV Inject into the vein every 14 (fourteen) days.     Panitumumab  (VECTIBIX  IV) Inject into the vein every 14 (fourteen) days.     pantoprazole  (PROTONIX ) 40 MG tablet Take 40 mg by mouth daily as needed (acid reflux).     potassium chloride  SA (KLOR-CON  M20) 20 MEQ tablet Take 2 tablets (40 mEq total) by mouth 2 (two) times daily. 360 tablet 3   prochlorperazine  (COMPAZINE ) 10 MG tablet Take 1 tablet (10 mg total) by mouth every 6 (six)  hours as needed for nausea or vomiting. 60 tablet 2   trifluridine -tipiracil  (LONSURF ) 20-8.19 MG tablet Take 3 tablets (60 mg of trifluridine  total) by mouth 2 (two) times daily after a meal. Take within 1 hr after AM & PM meals on days 1-5, 8-12. Repeat every 28 days. 60 tablet 2   No current facility-administered medications for this visit.   Facility-Administered Medications Ordered in Other Visits  Medication Dose Route Frequency Provider Last Rate Last Admin   0.9 %  sodium chloride  infusion   Intravenous Continuous Breanne Olvera, MD       bevacizumab -adcd (VEGZELMA ) 400 mg in sodium chloride  0.9 % 100 mL chemo infusion  5 mg/kg (Treatment Plan Recorded) Intravenous Once Tifanie Gardiner, MD        REVIEW OF SYSTEMS:   Constitutional: Denies fevers, chills or abnormal weight loss Eyes: Denies blurriness of vision Ears, nose, mouth, throat, and face: Denies mucositis or sore throat Respiratory: Denies cough, dyspnea or wheezes Cardiovascular: Denies palpitation, chest discomfort or lower extremity swelling Gastrointestinal:  Denies nausea, heartburn or change in bowel habits Skin: Denies abnormal skin rashes Lymphatics: Denies new lymphadenopathy or easy bruising Neurological:Denies numbness, tingling or new weaknesses Behavioral/Psych: Mood is stable, no new changes  All other systems were reviewed with the patient and are negative.   VITALS:  There were no vitals taken for this visit.  Wt Readings from Last 3 Encounters:  11/08/23 165 lb 5.5 oz (75 kg)  10/31/23 167 lb (75.8 kg)  10/25/23 166 lb (75.3 kg)    There is no height or weight on file to calculate BMI.  Performance status (ECOG): 0 - Asymptomatic  PHYSICAL EXAM:   GENERAL:alert, no distress and comfortable SKIN: skin color, texture, turgor are normal, no rashes or significant lesions LYMPH:  no palpable lymphadenopathy in the cervical, axillary or inguinal LUNGS: clear to auscultation and percussion with  normal breathing effort HEART: regular rate & rhythm and no murmurs and no lower extremity edema ABDOMEN:abdomen soft, non-tender and normal bowel sounds, left colostomy bag present Musculoskeletal:no cyanosis of digits and no clubbing  NEURO: alert & oriented x 3 with fluent speech, no focal motor/sensory deficits  LABORATORY DATA:  I have reviewed the data as listed  Lab Results  Component Value Date   WBC 3.0 (L) 11/08/2023   NEUTROABS 1.3 (L) 11/08/2023   HGB 10.1 (L) 11/08/2023   HCT 29.7 (L) 11/08/2023   MCV 101.7 (H) 11/08/2023   PLT 193 11/08/2023     Chemistry      Component Value Date/Time   NA 137 11/08/2023 0903   K 4.0 11/08/2023 0903   CL 107 11/08/2023 0903   CO2 19 (L) 11/08/2023 0903   BUN 7 11/08/2023 0903   CREATININE 0.73 11/08/2023 0903      Component Value Date/Time   CALCIUM  9.0 11/08/2023 0903   ALKPHOS 65 11/08/2023 0903   AST 27 11/08/2023 0903   ALT 11 11/08/2023 0903   BILITOT 0.6 11/08/2023 0903       Latest Reference Range & Units 10/10/23 09:28  Iron  28 - 170 ug/dL 83  UIBC ug/dL 828  TIBC 749 - 549 ug/dL 745  Saturation Ratios 10.4 - 31.8 % 33 (H)  Ferritin 11 - 307 ng/mL 176  Folate >5.9 ng/mL 16.2  Vitamin B12 180 - 914 pg/mL 543  (H): Data is abnormally high   Latest Reference Range & Units 09/11/23 10:27  CEA 0.0 - 4.7 ng/mL 306.0 (H)  (H): Data is abnormally high   Latest Reference Range & Units 11/08/23 09:08  Appearance CLEAR  CLEAR  Bilirubin Urine NEGATIVE  NEGATIVE  Color, Urine YELLOW  YELLOW  Glucose, UA NEGATIVE mg/dL NEGATIVE  Hgb urine dipstick NEGATIVE  NEGATIVE  Ketones, ur NEGATIVE mg/dL NEGATIVE  Leukocytes,Ua NEGATIVE  NEGATIVE  Nitrite NEGATIVE  NEGATIVE  pH 5.0 - 8.0  7.0  Protein NEGATIVE mg/dL NEGATIVE  Specific Gravity, Urine 1.005 - 1.030  1.012   RADIOGRAPHIC STUDIES: I have personally reviewed the radiological images as listed and agreed with the findings in the report.  CT CHEST ABDOMEN  PELVIS W CONTRAST CLINICAL DATA:  Metastatic disease evaluation, colon cancer restaging * Tracking Code: BO *  EXAM: CT CHEST, ABDOMEN, AND PELVIS WITH CONTRAST  TECHNIQUE: Multidetector CT imaging of the chest, abdomen and pelvis was performed following the standard protocol during bolus administration of intravenous contrast.  RADIATION DOSE REDUCTION: This exam was performed according to the departmental dose-optimization program which includes automated exposure control, adjustment of the mA and/or kV according to patient size and/or use of iterative reconstruction technique.  CONTRAST:  OMNIPAQUE  IOHEXOL  300 MG/ML SOLN additional oral enteric contrast  COMPARISON:  08/06/2023  FINDINGS: CT CHEST FINDINGS  Cardiovascular: Left chest port catheter. Normal heart size. No pericardial effusion.  Mediastinum/Nodes: No enlarged mediastinal, hilar, or axillary lymph nodes. Thyroid gland, trachea, and esophagus demonstrate no significant findings.  Lungs/Pleura: Unchanged 0.3 cm nodule of the lingula (series  3, image 77). Unchanged 0.3 cm nodule of the lateral segment right middle lobe (series 3, image 74). No pleural effusion or pneumothorax.  Musculoskeletal: No chest wall abnormality. No acute osseous findings.  CT ABDOMEN PELVIS FINDINGS  Hepatobiliary: No focal liver abnormality is seen. Status post cholecystectomy. No biliary dilatation.  Pancreas: Unremarkable. No pancreatic ductal dilatation or surrounding inflammatory changes.  Spleen: Normal in size without significant abnormality.  Adrenals/Urinary Tract: Adrenal glands are unremarkable. Kidneys are normal, without renal calculi, solid lesion, or hydronephrosis. Bladder is unremarkable.  Stomach/Bowel: Stomach is within normal limits. Appendix appears normal. Sigmoid colon resection with left lower quadrant end colostomy.  Vascular/Lymphatic: No significant vascular findings are  present. Unchanged metastatic soft tissue mass or lymph node partially encasing the inferior mesenteric artery in the sigmoid mesocolon measuring 1.9 x 1.8 cm (series 2, image 75).  Reproductive: No significant change in a bulky multicystic mass arising from the right ovary, measuring 14.4 x 11.9 cm (series 2, image 74). Multiple uterine fibroids, some of which are calcified. Unchanged small cyst of the left ovary.  Other: No abdominal wall hernia or abnormality. Trace free fluid in the low pelvis. Minimal residual peritoneal thickening in the left paracolic gutter (series 2, image 60).  Musculoskeletal: No acute osseous findings.  IMPRESSION: 1. No significant change in a bulky multicystic mass arising from the right ovary, measuring 14.4 x 11.9 cm. 2. Unchanged metastatic soft tissue mass or lymph node partially encasing the inferior mesenteric artery in the sigmoid mesocolon measuring 1.9 x 1.8 cm. 3. Unchanged tiny pulmonary nodules, nonspecific although most likely benign and incidental. Attention on follow-up. 4. Sigmoid colon resection with left lower quadrant end colostomy.  Electronically Signed   By: Marolyn JONETTA Jaksch M.D.   On: 10/26/2023 16:35

## 2023-11-08 NOTE — Telephone Encounter (Signed)
 Discussed case with Dr. Davonna with oncology and Dr. Mavis with general surgery.  Patient seen oncology today and would like to consider ostomy reversal if general surgery is willing.  She does have metastatic disease however has not had follow-up colonoscopy or flexible sigmoidoscopy of hartmans pouch since her resection therefore she is in need of surveillance.   Dr. Mavis is looking into considering whether or not she could even have reversal given metastatic disease.  He will be reviewing her records.  If colonoscopy and flexible sigmoidoscopy performed he does wish to have measurements of the length of her rectal stump to know if reversal is even possible.  Proceed with colonoscopy via colostomy and flexible sigmoidoscopy with propofol  by Dr. Cindie  in near future: the risks, benefits, and alternatives have been discussed with the patient in detail. The patient states understanding and desires to proceed. ASA 2 Dx: colonic adenocarcinoma, history of partial colectomy with hartmans pouch.   UPT if still having menstrual cycles  Needs enema morning of procedure  Awaiting recommendations from Dr. Davonna on if she needs to be off chemo for certain time frame for procedure.   Charmaine Melia, MSN, APRN, FNP-BC, AGACNP-BC [facility] at [facility]

## 2023-11-08 NOTE — Progress Notes (Signed)
 Ok to proceed with treatment with labs today.  ANC 1.3  V.O. Dr Ivery Molt, PharmD

## 2023-11-09 ENCOUNTER — Ambulatory Visit

## 2023-11-09 VITALS — BP 133/78 | HR 67 | Temp 97.5°F | Resp 18

## 2023-11-09 DIAGNOSIS — Z5112 Encounter for antineoplastic immunotherapy: Secondary | ICD-10-CM | POA: Diagnosis not present

## 2023-11-09 DIAGNOSIS — Z95828 Presence of other vascular implants and grafts: Secondary | ICD-10-CM

## 2023-11-09 DIAGNOSIS — C189 Malignant neoplasm of colon, unspecified: Secondary | ICD-10-CM

## 2023-11-09 MED ORDER — SODIUM CHLORIDE 0.9 % IV SOLN
5.0000 mg/kg | Freq: Once | INTRAVENOUS | Status: AC
  Administered 2023-11-09: 400 mg via INTRAVENOUS
  Filled 2023-11-09: qty 16

## 2023-11-09 MED ORDER — SODIUM CHLORIDE 0.9 % IV SOLN: INTRAVENOUS | Status: DC

## 2023-11-09 NOTE — Progress Notes (Signed)
 Patient presents today for Vegzelma  infusion. Vital signs within parameters . Labs drawn on 11-08-2023. ANC 1.3. See flow sheet on 11-08-2023.   Treatment given today per MD orders. Tolerated infusion without adverse affects. Vital signs stable. No complaints at this time. Discharged from clinic ambulatory in stable condition. Alert and oriented x 3. F/U with [facility] as scheduled.

## 2023-11-09 NOTE — Patient Instructions (Signed)
 [facility] [place] - A DEPT OF [facility]  Discharge Instructions: Thank you for choosing [facility] to provide your oncology and hematology care.  If you have a lab appointment with the Cancer Center - please note that after April 8th, 2024, all labs will be drawn in the cancer center.  You do not have to check in or register with the main entrance as you have in the past but will complete your check-in in the cancer center.  Wear comfortable clothing and clothing appropriate for easy access to any Portacath or PICC line.   We strive to give you quality time with your provider. You may need to reschedule your appointment if you arrive late (15 or more minutes).  Arriving late affects you and other patients whose appointments are after yours.  Also, if you miss three or more appointments without notifying the office, you may be dismissed from the clinic at the provider's discretion.      For prescription refill requests, have your pharmacy contact our office and allow 72 hours for refills to be completed.    Today you received the following chemotherapy and/or immunotherapy agents Vegzelma.  Bevacizumab Injection What is this medication? BEVACIZUMAB (be va SIZ yoo mab) treats some types of cancer. It works by blocking a protein that causes cancer cells to grow and multiply. This helps to slow or stop the spread of cancer cells. It is a monoclonal antibody. This medicine may be used for other purposes; ask your health care provider or pharmacist if you have questions. COMMON BRAND NAME(S): Alymsys, Avastin, MVASI, Rosaland Lao What should I tell my care team before I take this medication? They need to know if you have any of these conditions: Blood clots Coughing up blood Having or recent surgery Heart failure High blood pressure History of a connection between 2 or more body parts that do not usually connect (fistula) History of a tear in your  stomach or intestines Protein in your urine An unusual or allergic reaction to bevacizumab, other medications, foods, dyes, or preservatives Pregnant or trying to get pregnant Breast-feeding How should I use this medication? This medication is injected into a vein. It is given by your care team in a hospital or clinic setting. Talk to your care team the use of this medication in children. Special care may be needed. Overdosage: If you think you have taken too much of this medicine contact a poison control center or emergency room at once. NOTE: This medicine is only for you. Do not share this medicine with others. What if I miss a dose? Keep appointments for follow-up doses. It is important not to miss your dose. Call your care team if you are unable to keep an appointment. What may interact with this medication? Interactions are not expected. This list may not describe all possible interactions. Give your health care provider a list of all the medicines, herbs, non-prescription drugs, or dietary supplements you use. Also tell them if you smoke, drink alcohol, or use illegal drugs. Some items may interact with your medicine. What should I watch for while using this medication? Your condition will be monitored carefully while you are receiving this medication. You may need blood work while taking this medication. This medication may make you feel generally unwell. This is not uncommon as chemotherapy can affect healthy cells as well as cancer cells. Report any side effects. Continue your course of treatment even though you feel  ill unless your care team tells you to stop. This medication may increase your risk to bruise or bleed. Call your care team if you notice any unusual bleeding. Before having surgery, talk to your care team to make sure it is ok. This medication can increase the risk of poor healing of your surgical site or wound. You will need to stop this medication for 28 days before  surgery. After surgery, wait at least 28 days before restarting this medication. Make sure the surgical site or wound is healed enough before restarting this medication. Talk to your care team if questions. Talk to your care team if you may be pregnant. Serious birth defects can occur if you take this medication during pregnancy and for 6 months after the last dose. Contraception is recommended while taking this medication and for 6 months after the last dose. Your care team can help you find the option that works for you. Do not breastfeed while taking this medication and for 6 months after the last dose. This medication can cause infertility. Talk to your care team if you are concerned about your fertility. What side effects may I notice from receiving this medication? Side effects that you should report to your care team as soon as possible: Allergic reactions--skin rash, itching, hives, swelling of the face, lips, tongue, or throat Bleeding--bloody or black, tar-like stools, vomiting blood or Kadyn Chovan material that looks like coffee grounds, red or dark Sylvain Hasten urine, small red or purple spots on skin, unusual bruising or bleeding Blood clot--pain, swelling, or warmth in the leg, shortness of breath, chest pain Heart attack--pain or tightness in the chest, shoulders, arms, or jaw, nausea, shortness of breath, cold or clammy skin, feeling faint or lightheaded Heart failure--shortness of breath, swelling of the ankles, feet, or hands, sudden weight gain, unusual weakness or fatigue Increase in blood pressure Infection--fever, chills, cough, sore throat, wounds that don't heal, pain or trouble when passing urine, general feeling of discomfort or being unwell Infusion reactions--chest pain, shortness of breath or trouble breathing, feeling faint or lightheaded Kidney injury--decrease in the amount of urine, swelling of the ankles, hands, or feet Stomach pain that is severe, does not go away, or gets  worse Stroke--sudden numbness or weakness of the face, arm, or leg, trouble speaking, confusion, trouble walking, loss of balance or coordination, dizziness, severe headache, change in vision Sudden and severe headache, confusion, change in vision, seizures, which may be signs of posterior reversible encephalopathy syndrome (PRES) Side effects that usually do not require medical attention (report to your care team if they continue or are bothersome): Back pain Change in taste Diarrhea Dry skin Increased tears Nosebleed This list may not describe all possible side effects. Call your doctor for medical advice about side effects. You may report side effects to FDA at 1-800-FDA-1088. Where should I keep my medication? This medication is given in a hospital or clinic. It will not be stored at home. NOTE: This sheet is a summary. It may not cover all possible information. If you have questions about this medicine, talk to your doctor, pharmacist, or health care provider.  2024 Elsevier/Gold Standard (2021-06-11 00:00:00)       To help prevent nausea and vomiting after your treatment, we encourage you to take your nausea medication as directed.  BELOW ARE SYMPTOMS THAT SHOULD BE REPORTED IMMEDIATELY: *FEVER GREATER THAN 100.4 F (38 C) OR HIGHER *CHILLS OR SWEATING *NAUSEA AND VOMITING THAT IS NOT CONTROLLED WITH YOUR NAUSEA MEDICATION *UNUSUAL SHORTNESS  OF BREATH *UNUSUAL BRUISING OR BLEEDING *URINARY PROBLEMS (pain or burning when urinating, or frequent urination) *BOWEL PROBLEMS (unusual diarrhea, constipation, pain near the anus) TENDERNESS IN MOUTH AND THROAT WITH OR WITHOUT PRESENCE OF ULCERS (sore throat, sores in mouth, or a toothache) UNUSUAL RASH, SWELLING OR PAIN  UNUSUAL VAGINAL DISCHARGE OR ITCHING   Items with * indicate a potential emergency and should be followed up as soon as possible or go to the Emergency Department if any problems should occur.  Please show the  CHEMOTHERAPY ALERT CARD or IMMUNOTHERAPY ALERT CARD at check-in to the Emergency Department and triage nurse.  Should you have questions after your visit or need to cancel or reschedule your appointment, please contact [facility] [place] - A DEPT OF Eligha Bridegroom [facility] [phone]  and follow the prompts.  Office hours are 8:00 a.m. to 4:30 p.m. Monday - Friday. Please note that voicemails left after 4:00 p.m. may not be returned until the following business day.  We are closed weekends and major holidays. You have access to a nurse at all times for urgent questions. Please call the main number to the clinic [phone] and follow the prompts.  For any non-urgent questions, you may also contact your provider using MyChart. We now offer e-Visits for anyone 92 and older to request care online for non-urgent symptoms. For details visit [email or web address].   Also download the MyChart app! Go to the app store, search "MyChart", open the app, select , and log in with your MyChart username and password.

## 2023-11-10 ENCOUNTER — Other Ambulatory Visit: Payer: Self-pay

## 2023-11-10 NOTE — Telephone Encounter (Signed)
 noted

## 2023-11-10 NOTE — Telephone Encounter (Signed)
 Noted. We will wait to hear back from you.

## 2023-11-12 ENCOUNTER — Other Ambulatory Visit: Payer: Self-pay

## 2023-11-19 ENCOUNTER — Other Ambulatory Visit: Payer: Self-pay

## 2023-11-22 ENCOUNTER — Other Ambulatory Visit: Payer: Self-pay | Admitting: Hematology

## 2023-11-22 ENCOUNTER — Inpatient Hospital Stay

## 2023-11-22 ENCOUNTER — Other Ambulatory Visit: Payer: Self-pay

## 2023-11-22 VITALS — BP 136/70 | HR 76 | Temp 98.0°F | Resp 20

## 2023-11-22 DIAGNOSIS — C189 Malignant neoplasm of colon, unspecified: Secondary | ICD-10-CM

## 2023-11-22 DIAGNOSIS — Z95828 Presence of other vascular implants and grafts: Secondary | ICD-10-CM

## 2023-11-22 DIAGNOSIS — Z5112 Encounter for antineoplastic immunotherapy: Secondary | ICD-10-CM | POA: Diagnosis not present

## 2023-11-22 LAB — CBC WITH DIFFERENTIAL/PLATELET
Abs Immature Granulocytes: 0.01 K/uL (ref 0.00–0.07)
Basophils Absolute: 0 K/uL (ref 0.0–0.1)
Basophils Relative: 0 %
Eosinophils Absolute: 0 K/uL (ref 0.0–0.5)
Eosinophils Relative: 0 %
HCT: 24.4 % — ABNORMAL LOW (ref 36.0–46.0)
Hemoglobin: 8.3 g/dL — ABNORMAL LOW (ref 12.0–15.0)
Immature Granulocytes: 0 %
Lymphocytes Relative: 51 %
Lymphs Abs: 1.6 K/uL (ref 0.7–4.0)
MCH: 33.7 pg (ref 26.0–34.0)
MCHC: 34 g/dL (ref 30.0–36.0)
MCV: 99.2 fL (ref 80.0–100.0)
Monocytes Absolute: 0.1 K/uL (ref 0.1–1.0)
Monocytes Relative: 4 %
Neutro Abs: 1.4 K/uL — ABNORMAL LOW (ref 1.7–7.7)
Neutrophils Relative %: 45 %
Platelets: 179 K/uL (ref 150–400)
RBC: 2.46 MIL/uL — ABNORMAL LOW (ref 3.87–5.11)
RDW: 16.7 % — ABNORMAL HIGH (ref 11.5–15.5)
WBC: 3.1 K/uL — ABNORMAL LOW (ref 4.0–10.5)
nRBC: 0 % (ref 0.0–0.2)

## 2023-11-22 LAB — COMPREHENSIVE METABOLIC PANEL WITH GFR
ALT: 5 U/L (ref 0–44)
AST: 18 U/L (ref 15–41)
Albumin: 3.7 g/dL (ref 3.5–5.0)
Alkaline Phosphatase: 53 U/L (ref 38–126)
Anion gap: 9 (ref 5–15)
BUN: 10 mg/dL (ref 6–20)
CO2: 21 mmol/L — ABNORMAL LOW (ref 22–32)
Calcium: 8.6 mg/dL — ABNORMAL LOW (ref 8.9–10.3)
Chloride: 109 mmol/L (ref 98–111)
Creatinine, Ser: 0.75 mg/dL (ref 0.44–1.00)
GFR, Estimated: >60 mL/min (ref >60)
Glucose, Bld: 94 mg/dL (ref 70–99)
Potassium: 4.1 mmol/L (ref 3.5–5.1)
Sodium: 138 mmol/L (ref 135–145)
Total Bilirubin: 0.4 mg/dL (ref 0.0–1.2)
Total Protein: 6.7 g/dL (ref 6.5–8.1)

## 2023-11-22 LAB — MAGNESIUM: Magnesium: 2 mg/dL (ref 1.7–2.4)

## 2023-11-22 MED ORDER — SODIUM CHLORIDE 0.9 % IV SOLN: INTRAVENOUS | Status: DC

## 2023-11-22 MED ORDER — SODIUM CHLORIDE 0.9 % IV SOLN
5.0000 mg/kg | Freq: Once | INTRAVENOUS | Status: AC
  Administered 2023-11-22: 400 mg via INTRAVENOUS
  Filled 2023-11-22: qty 16

## 2023-11-22 NOTE — Progress Notes (Signed)
 Patient is taking Lonsurf  as prescribed.  Patient has not missed any doses and reports no side effects at this time.    Patient tolerated chemotherapy with no complaints voiced.  Side effects with management reviewed with understanding verbalized.  Port site clean and dry with no bruising or swelling noted at site.  Good blood return noted before and after administration of chemotherapy.  Band aid applied.  Patient left in satisfactory condition with VSS and no s/s of distress noted.

## 2023-11-22 NOTE — Patient Instructions (Signed)
 [facility] [place] - A DEPT OF [facility]  Discharge Instructions: Thank you for choosing [facility] to provide your oncology and hematology care.  If you have a lab appointment with the Cancer Center - please note that after April 8th, 2024, all labs will be drawn in the cancer center.  You do not have to check in or register with the main entrance as you have in the past but will complete your check-in in the cancer center.  Wear comfortable clothing and clothing appropriate for easy access to any Portacath or PICC line.   We strive to give you quality time with your provider. You may need to reschedule your appointment if you arrive late (15 or more minutes).  Arriving late affects you and other patients whose appointments are after yours.  Also, if you miss three or more appointments without notifying the office, you may be dismissed from the clinic at the provider's discretion.      For prescription refill requests, have your pharmacy contact our office and allow 72 hours for refills to be completed.    Today you received the following chemotherapy and/or immunotherapy agents Vegzelma    To help prevent nausea and vomiting after your treatment, we encourage you to take your nausea medication as directed.  BELOW ARE SYMPTOMS THAT SHOULD BE REPORTED IMMEDIATELY: *FEVER GREATER THAN 100.4 F (38 C) OR HIGHER *CHILLS OR SWEATING *NAUSEA AND VOMITING THAT IS NOT CONTROLLED WITH YOUR NAUSEA MEDICATION *UNUSUAL SHORTNESS OF BREATH *UNUSUAL BRUISING OR BLEEDING *URINARY PROBLEMS (pain or burning when urinating, or frequent urination) *BOWEL PROBLEMS (unusual diarrhea, constipation, pain near the anus) TENDERNESS IN MOUTH AND THROAT WITH OR WITHOUT PRESENCE OF ULCERS (sore throat, sores in mouth, or a toothache) UNUSUAL RASH, SWELLING OR PAIN  UNUSUAL VAGINAL DISCHARGE OR ITCHING   Items with * indicate a potential emergency and should be followed up as  soon as possible or go to the Emergency Department if any problems should occur.  Please show the CHEMOTHERAPY ALERT CARD or IMMUNOTHERAPY ALERT CARD at check-in to the Emergency Department and triage nurse.  Should you have questions after your visit or need to cancel or reschedule your appointment, please contact [facility] [place] - A DEPT OF [facility] [phone]  and follow the prompts.  Office hours are 8:00 a.m. to 4:30 p.m. Monday - Friday. Please note that voicemails left after 4:00 p.m. may not be returned until the following business day.  We are closed weekends and major holidays. You have access to a nurse at all times for urgent questions. Please call the main number to the clinic [phone] and follow the prompts.  For any non-urgent questions, you may also contact your provider using MyChart. We now offer e-Visits for anyone 51 and older to request care online for non-urgent symptoms. For details visit [email or web address].   Also download the MyChart app! Go to the app store, search "MyChart", open the app, select [facility], and log in with your MyChart username and password.

## 2023-11-23 ENCOUNTER — Encounter (HOSPITAL_COMMUNITY): Payer: Self-pay | Admitting: Oncology

## 2023-11-23 ENCOUNTER — Other Ambulatory Visit: Payer: Self-pay

## 2023-11-23 ENCOUNTER — Encounter: Payer: Self-pay | Admitting: Oncology

## 2023-11-23 MED ORDER — LONSURF 20-8.19 MG PO TABS
60.0000 mg | ORAL_TABLET | Freq: Two times a day (BID) | ORAL | 2 refills | Status: DC
  Filled 2023-11-23 – 2023-11-24 (×2): qty 60, 10d supply, fill #0
  Filled 2023-11-27: qty 60, 28d supply, fill #0
  Filled 2023-12-18: qty 60, 28d supply, fill #1
  Filled 2024-01-18: qty 60, 28d supply, fill #2

## 2023-11-23 NOTE — Telephone Encounter (Signed)
 Chart reviewed. Lonsurf  refilled per last office note with Dr. Davonna.

## 2023-11-24 ENCOUNTER — Other Ambulatory Visit (HOSPITAL_COMMUNITY): Payer: Self-pay

## 2023-11-27 ENCOUNTER — Encounter (HOSPITAL_COMMUNITY): Payer: Self-pay | Admitting: Oncology

## 2023-11-27 ENCOUNTER — Other Ambulatory Visit: Payer: Self-pay | Admitting: *Deleted

## 2023-11-27 ENCOUNTER — Encounter: Payer: Self-pay | Admitting: Oncology

## 2023-11-27 ENCOUNTER — Other Ambulatory Visit: Payer: Self-pay

## 2023-11-27 ENCOUNTER — Other Ambulatory Visit (HOSPITAL_COMMUNITY): Payer: Self-pay

## 2023-11-27 MED ORDER — AMLODIPINE BESYLATE 10 MG PO TABS: 10.0000 mg | ORAL_TABLET | Freq: Every day | ORAL | 1 refills | Status: AC

## 2023-11-28 ENCOUNTER — Other Ambulatory Visit: Payer: Self-pay

## 2023-11-28 NOTE — Progress Notes (Signed)
 Specialty Pharmacy Refill Coordination Note  Diana Fox is a 53 y.o. female contacted today regarding refills of specialty medication(s) Trifluridine -Tipiracil  (Lonsurf )   Patient requested Delivery   Delivery date: 11/30/23   Verified address: [address]   [place] [address]   Medication will be filled on 11/29/23.

## 2023-12-04 ENCOUNTER — Other Ambulatory Visit: Payer: Self-pay | Admitting: Oncology

## 2023-12-04 DIAGNOSIS — C189 Malignant neoplasm of colon, unspecified: Secondary | ICD-10-CM

## 2023-12-04 DIAGNOSIS — Z95828 Presence of other vascular implants and grafts: Secondary | ICD-10-CM

## 2023-12-05 ENCOUNTER — Other Ambulatory Visit: Payer: Self-pay

## 2023-12-05 ENCOUNTER — Inpatient Hospital Stay

## 2023-12-05 DIAGNOSIS — Z5112 Encounter for antineoplastic immunotherapy: Secondary | ICD-10-CM | POA: Diagnosis not present

## 2023-12-05 DIAGNOSIS — Z95828 Presence of other vascular implants and grafts: Secondary | ICD-10-CM

## 2023-12-05 DIAGNOSIS — C189 Malignant neoplasm of colon, unspecified: Secondary | ICD-10-CM

## 2023-12-05 LAB — COMPREHENSIVE METABOLIC PANEL WITH GFR
ALT: 11 U/L (ref 0–44)
AST: 22 U/L (ref 15–41)
Albumin: 4 g/dL (ref 3.5–5.0)
Alkaline Phosphatase: 61 U/L (ref 38–126)
Anion gap: 11 (ref 5–15)
BUN: 9 mg/dL (ref 6–20)
CO2: 20 mmol/L — ABNORMAL LOW (ref 22–32)
Calcium: 8.7 mg/dL — ABNORMAL LOW (ref 8.9–10.3)
Chloride: 109 mmol/L (ref 98–111)
Creatinine, Ser: 0.68 mg/dL (ref 0.44–1.00)
GFR, Estimated: >60 mL/min (ref >60)
Glucose, Bld: 90 mg/dL (ref 70–99)
Potassium: 3.9 mmol/L (ref 3.5–5.1)
Sodium: 140 mmol/L (ref 135–145)
Total Bilirubin: 0.7 mg/dL (ref 0.0–1.2)
Total Protein: 7.3 g/dL (ref 6.5–8.1)

## 2023-12-05 LAB — CBC WITH DIFFERENTIAL/PLATELET
Abs Immature Granulocytes: 0 K/uL (ref 0.00–0.07)
Basophils Absolute: 0 K/uL (ref 0.0–0.1)
Basophils Relative: 1 %
Eosinophils Absolute: 0 K/uL (ref 0.0–0.5)
Eosinophils Relative: 1 %
HCT: 27.4 % — ABNORMAL LOW (ref 36.0–46.0)
Hemoglobin: 9.3 g/dL — ABNORMAL LOW (ref 12.0–15.0)
Immature Granulocytes: 0 %
Lymphocytes Relative: 60 %
Lymphs Abs: 1.3 K/uL (ref 0.7–4.0)
MCH: 35.1 pg — ABNORMAL HIGH (ref 26.0–34.0)
MCHC: 33.9 g/dL (ref 30.0–36.0)
MCV: 103.4 fL — ABNORMAL HIGH (ref 80.0–100.0)
Monocytes Absolute: 0.3 K/uL (ref 0.1–1.0)
Monocytes Relative: 16 %
Neutro Abs: 0.5 K/uL — ABNORMAL LOW (ref 1.7–7.7)
Neutrophils Relative %: 22 %
Platelets: 167 K/uL (ref 150–400)
RBC: 2.65 MIL/uL — ABNORMAL LOW (ref 3.87–5.11)
RDW: 18 % — ABNORMAL HIGH (ref 11.5–15.5)
Smear Review: NORMAL
WBC: 2.2 K/uL — ABNORMAL LOW (ref 4.0–10.5)
nRBC: 0 % (ref 0.0–0.2)

## 2023-12-05 LAB — URINALYSIS, DIPSTICK ONLY
Bilirubin Urine: NEGATIVE
Glucose, UA: NEGATIVE mg/dL
Hgb urine dipstick: NEGATIVE
Ketones, ur: NEGATIVE mg/dL
Nitrite: NEGATIVE
Protein, ur: 30 mg/dL — AB
Specific Gravity, Urine: 1.016 (ref 1.005–1.030)
pH: 5 (ref 5.0–8.0)

## 2023-12-05 LAB — MAGNESIUM: Magnesium: 2.1 mg/dL (ref 1.7–2.4)

## 2023-12-05 LAB — PREGNANCY, URINE: Preg Test, Ur: NEGATIVE

## 2023-12-05 NOTE — Progress Notes (Signed)
 Port flushed with good blood return noted. No bruising or swelling at site. Bandaid applied and patient discharged in satisfactory condition. VVS stable with no signs or symptoms of distressed noted.

## 2023-12-06 ENCOUNTER — Ambulatory Visit: Admitting: Orthopedic Surgery

## 2023-12-06 ENCOUNTER — Other Ambulatory Visit: Payer: Self-pay

## 2023-12-06 ENCOUNTER — Ambulatory Visit

## 2023-12-06 ENCOUNTER — Inpatient Hospital Stay: Admitting: Oncology

## 2023-12-06 ENCOUNTER — Inpatient Hospital Stay

## 2023-12-06 ENCOUNTER — Encounter: Payer: Self-pay | Admitting: Oncology

## 2023-12-06 ENCOUNTER — Ambulatory Visit: Admitting: Oncology

## 2023-12-06 VITALS — BP 123/77 | HR 79 | Temp 97.9°F | Resp 16

## 2023-12-06 VITALS — BP 142/81 | HR 86 | Temp 98.3°F | Resp 18

## 2023-12-06 DIAGNOSIS — D61818 Other pancytopenia: Secondary | ICD-10-CM | POA: Diagnosis not present

## 2023-12-06 DIAGNOSIS — C189 Malignant neoplasm of colon, unspecified: Secondary | ICD-10-CM

## 2023-12-06 DIAGNOSIS — Z95828 Presence of other vascular implants and grafts: Secondary | ICD-10-CM

## 2023-12-06 DIAGNOSIS — Z5112 Encounter for antineoplastic immunotherapy: Secondary | ICD-10-CM | POA: Diagnosis not present

## 2023-12-06 LAB — CEA: CEA: 265 ng/mL — ABNORMAL HIGH (ref 0.0–4.7)

## 2023-12-06 MED ORDER — SODIUM CHLORIDE 0.9 % IV SOLN
5.0000 mg/kg | Freq: Once | INTRAVENOUS | Status: AC
  Administered 2023-12-06: 400 mg via INTRAVENOUS
  Filled 2023-12-06: qty 16

## 2023-12-06 MED ORDER — SODIUM CHLORIDE 0.9 % IV SOLN: INTRAVENOUS | Status: DC

## 2023-12-06 NOTE — Patient Instructions (Signed)
 [facility] [place] - A DEPT OF [facility]  Discharge Instructions: Thank you for choosing  Cancer Center to provide your oncology and hematology care.  If you have a lab appointment with the Cancer Center - please note that after April 8th, 2024, all labs will be drawn in the cancer center.  You do not have to check in or register with the main entrance as you have in the past but will complete your check-in in the cancer center.  Wear comfortable clothing and clothing appropriate for easy access to any Portacath or PICC line.   We strive to give you quality time with your provider. You may need to reschedule your appointment if you arrive late (15 or more minutes).  Arriving late affects you and other patients whose appointments are after yours.  Also, if you miss three or more appointments without notifying the office, you may be dismissed from the clinic at the provider's discretion.      For prescription refill requests, have your pharmacy contact our office and allow 72 hours for refills to be completed.    Today you received the following chemotherapy and/or immunotherapy agents Avastin   To help prevent nausea and vomiting after your treatment, we encourage you to take your nausea medication as directed.  Bevacizumab Injection What is this medication? BEVACIZUMAB (be va SIZ yoo mab) treats some types of cancer. It works by blocking a protein that causes cancer cells to grow and multiply. This helps to slow or stop the spread of cancer cells. It is a monoclonal antibody. This medicine may be used for other purposes; ask your health care provider or pharmacist if you have questions. COMMON BRAND NAME(S): Alymsys, Avastin, MVASI, Rosaland Lao What should I tell my care team before I take this medication? They need to know if you have any of these conditions: Blood clots Coughing up blood Having or recent surgery Heart failure High blood  pressure History of a connection between 2 or more body parts that do not usually connect (fistula) History of a tear in your stomach or intestines Protein in your urine An unusual or allergic reaction to bevacizumab, other medications, foods, dyes, or preservatives Pregnant or trying to get pregnant Breast-feeding How should I use this medication? This medication is injected into a vein. It is given by your care team in a hospital or clinic setting. Talk to your care team the use of this medication in children. Special care may be needed. Overdosage: If you think you have taken too much of this medicine contact a poison control center or emergency room at once. NOTE: This medicine is only for you. Do not share this medicine with others. What if I miss a dose? Keep appointments for follow-up doses. It is important not to miss your dose. Call your care team if you are unable to keep an appointment. What may interact with this medication? Interactions are not expected. This list may not describe all possible interactions. Give your health care provider a list of all the medicines, herbs, non-prescription drugs, or dietary supplements you use. Also tell them if you smoke, drink alcohol, or use illegal drugs. Some items may interact with your medicine. What should I watch for while using this medication? Your condition will be monitored carefully while you are receiving this medication. You may need blood work while taking this medication. This medication may make you feel generally unwell. This is not uncommon as chemotherapy can  affect healthy cells as well as cancer cells. Report any side effects. Continue your course of treatment even though you feel ill unless your care team tells you to stop. This medication may increase your risk to bruise or bleed. Call your care team if you notice any unusual bleeding. Before having surgery, talk to your care team to make sure it is ok. This medication can  increase the risk of poor healing of your surgical site or wound. You will need to stop this medication for 28 days before surgery. After surgery, wait at least 28 days before restarting this medication. Make sure the surgical site or wound is healed enough before restarting this medication. Talk to your care team if questions. Talk to your care team if you may be pregnant. Serious birth defects can occur if you take this medication during pregnancy and for 6 months after the last dose. Contraception is recommended while taking this medication and for 6 months after the last dose. Your care team can help you find the option that works for you. Do not breastfeed while taking this medication and for 6 months after the last dose. This medication can cause infertility. Talk to your care team if you are concerned about your fertility. What side effects may I notice from receiving this medication? Side effects that you should report to your care team as soon as possible: Allergic reactions--skin rash, itching, hives, swelling of the face, lips, tongue, or throat Bleeding--bloody or black, tar-like stools, vomiting blood or brown material that looks like coffee grounds, red or dark brown urine, small red or purple spots on skin, unusual bruising or bleeding Blood clot--pain, swelling, or warmth in the leg, shortness of breath, chest pain Heart attack--pain or tightness in the chest, shoulders, arms, or jaw, nausea, shortness of breath, cold or clammy skin, feeling faint or lightheaded Heart failure--shortness of breath, swelling of the ankles, feet, or hands, sudden weight gain, unusual weakness or fatigue Increase in blood pressure Infection--fever, chills, cough, sore throat, wounds that don't heal, pain or trouble when passing urine, general feeling of discomfort or being unwell Infusion reactions--chest pain, shortness of breath or trouble breathing, feeling faint or lightheaded Kidney injury--decrease in  the amount of urine, swelling of the ankles, hands, or feet Stomach pain that is severe, does not go away, or gets worse Stroke--sudden numbness or weakness of the face, arm, or leg, trouble speaking, confusion, trouble walking, loss of balance or coordination, dizziness, severe headache, change in vision Sudden and severe headache, confusion, change in vision, seizures, which may be signs of posterior reversible encephalopathy syndrome (PRES) Side effects that usually do not require medical attention (report to your care team if they continue or are bothersome): Back pain Change in taste Diarrhea Dry skin Increased tears Nosebleed This list may not describe all possible side effects. Call your doctor for medical advice about side effects. You may report side effects to FDA at 1-800-FDA-1088. Where should I keep my medication? This medication is given in a hospital or clinic. It will not be stored at home. NOTE: This sheet is a summary. It may not cover all possible information. If you have questions about this medicine, talk to your doctor, pharmacist, or health care provider.  2024 Elsevier/Gold Standard (2021-06-11 00:00:00)  BELOW ARE SYMPTOMS THAT SHOULD BE REPORTED IMMEDIATELY: *FEVER GREATER THAN 100.4 F (38 C) OR HIGHER *CHILLS OR SWEATING *NAUSEA AND VOMITING THAT IS NOT CONTROLLED WITH YOUR NAUSEA MEDICATION *UNUSUAL SHORTNESS OF BREATH *UNUSUAL BRUISING  OR BLEEDING *URINARY PROBLEMS (pain or burning when urinating, or frequent urination) *BOWEL PROBLEMS (unusual diarrhea, constipation, pain near the anus) TENDERNESS IN MOUTH AND THROAT WITH OR WITHOUT PRESENCE OF ULCERS (sore throat, sores in mouth, or a toothache) UNUSUAL RASH, SWELLING OR PAIN  UNUSUAL VAGINAL DISCHARGE OR ITCHING   Items with * indicate a potential emergency and should be followed up as soon as possible or go to the Emergency Department if any problems should occur.  Please show the CHEMOTHERAPY ALERT  CARD or IMMUNOTHERAPY ALERT CARD at check-in to the Emergency Department and triage nurse.  Should you have questions after your visit or need to cancel or reschedule your appointment, please contact [facility] [place] - A DEPT OF [facility] [phone]  and follow the prompts.  Office hours are 8:00 a.m. to 4:30 p.m. Monday - Friday. Please note that voicemails left after 4:00 p.m. may not be returned until the following business day.  We are closed weekends and major holidays. You have access to a nurse at all times for urgent questions. Please call the main number to the clinic [phone] and follow the prompts.  For any non-urgent questions, you may also contact your provider using MyChart. We now offer e-Visits for anyone 55 and older to request care online for non-urgent symptoms. For details visit [email or web address].   Also download the MyChart app! Go to the app store, search "MyChart", open the app, select [place], and log in with your MyChart username and password.

## 2023-12-06 NOTE — Progress Notes (Signed)
 Patient presents today for chemotherapy Avastin  infusion. Patient is in satisfactory condition with no new complaints voiced.  Vital signs are stable.  Labs reviewed by Dr. Davonna during the office visit and all other labs are within treatment parameters. Patient's ANC noted to be 0.5 today. We will proceed with treatment per MD orders.   Treatment given today per MD orders. Tolerated infusion without adverse affects. Vital signs stable. No complaints at this time. Discharged from clinic ambulatory in stable condition. Alert and oriented x 3. F/U with [facility] as scheduled.

## 2023-12-06 NOTE — Progress Notes (Signed)
 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 S/p first-line treatment with FOLFOXIRI and panitumumab  progression Second line-FOLFOX plus panitumumab  Third line-Lonsurf  plus bevacizumab    - Patient tolerating Lonsurf  and bevacizumab  well.  She reports no complaints from it today - Scheduled for cycle 7-day 1 today.  Physical exam and labs stable.  Will proceed with chemotherapy today. - Repeat CT scan in 3 months.  Due 01/2024 - CEA stable at this time, will keep a close eye.  Will repeat with every cycle. - Labs reviewed today: CMP: Normal creatinine, normal potassium and magnesium , normal LFTs.  CBC: WBC: 2.2, hemoglobin: 9.3, platelets: 167.  UA with no significant proteinuria (1+) - Continue Lonsurf  60 mg 2 times daily on days 1-5, 8-12 of a 28-day cycle  Return to clinic in 6 weeks for follow up  Chemotherapy-induced anemia Anemia persists with hemoglobin at 10.1, improved from 7.6. Attributed to Lonsurf ; other nutritional causes ruled out.  Chemotherapy-induced leukopenia Low white blood cell count due to Lonsurf , under close monitoring.  Colostomy status She inquired about colonoscopy for potential colostomy reversal.   - Consider patient has CEA that has been uptrending recently that he will have to hold Avastin  for both colonoscopy and colostomy reversal, we will delay this at this time. - Reversal of colostomy is usually  not indicated in metastatic colon cancer but patient is very motivated to do this.   The patient understands the plans discussed today and is in agreement with them.  She knows to contact our office if she develops concerns prior to her next appointment.  The total time spent in the appointment was 20 minutes for the encounter  with patient, including review of chart and various tests results, discussions about plan of care and coordination of care plan  I, Marijo Sharps, acting as a scribe for Mickiel Dry, MD.,have documented all relevant documentation on the behalf of Mickiel Dry, MD,as directed by  Mickiel Dry, MD while in the presence of Mickiel Dry, MD.  I, Mickiel Dry MD, have reviewed the above documentation for accuracy and completeness, and I agree with the above.    Mickiel Dry, MD  [facility] [place] - A DEPT OF [facility] HUN[facility] [address] [place] [address] Dept: [phone] Dept Fax: [phone]   No orders of the defined types were placed in this encounter.    ONCOLOGY HISTORY:   I have reviewed her chart and materials related to her cancer extensively and collaborated history with the patient. 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 There are  bilateral mildly enlarged common iliac chain nodes associated with this. There are mildly dilated mid to lower abdominal small bowel segments up to 3.2 cm without visible transition and decompressed but occasionally thickened  small bowel segments in the right lower abdomen. -03/02/2021: Colonoscopy found: Nonbleeding internal hemorrhoids, severe stenosis in the sigmoid colon at approximately 18 cm from anal verge.  Pathology of sigmoid colon: Adenocarcinoma, moderately to poorly differentiated.  -03/24/2021: CEA 61.5.  -03/26/2021: Sigmoid colon resection, partial small bowel resection and Hartman's procedure. Tumor extended outside the colon to the pelvic brim and the surgeon was unable to fully excise all of the tumor in the pelvis.  Pathology: Colonic adenocarcinoma, moderately to poorly differentiated, involving the subserosal adipose tissue and present at the margin. Small bowel resection positive for adenocarcinoma nodules in the serosal fat. 9 out of 9 lymph nodes positive for metastatic carcinoma (9/9). MMR preserved. pT4, pN2b  -03/29/2021: NGS: KRAS/NRAS negative, BRAF negative, HER2 IHC (0) negative, MSI-stable, PDL1 : 0, Negative, POLE: Negative, PTEN: IHC: positive 1+, 100%. -04/29/2021: PET: Multifocal hypermetabolic nodal, peritoneal and anterior abdominal wall hypermetabolic activity. Based on the presence of metastatic disease within lymph nodes and along the serosal surface of the small bowel at time of the patient's prior surgery, these findings are worrisome for progressive nodal metastatic disease and peritoneal carcinomatosis. No evidence of hepatic metastatic disease or other distant metastases. -05/17/2021-11/2021: FOLFOXIRI and Panitumumab , oxaliplatin  discontinued after 10 cycles -11/2021-12/29/2022: FOLFIRI and Panitumumab , discontinued due to progression -01/10/2023: CT CAP: Interval enlargement of a heterogeneous mixed solid and cystic mass arising from the right adnexa, consistent with worsened ovarian metastatic disease. Interval enlargement of a soft tissue mass in the sigmoid mesentery consistent with worsened mesenteric soft tissue or nodal metastatic disease. -10/04/2021: Colonoscopy negative  for malignancy.  -01/18/2023-05/31/2023: FOLFOX and bevacizumab , discontinued due to progression -06/07/2023: CT CAP: Interval enlargement of the complex mixed cystic and solid mass arising from the right adnexa. Interval enlargement of the soft tissue mass/lymph node in the sigmoid mesentery along the left common iliac artery. -06/16/2023: Guardant360: GNAS, T p53, APC, MSI high not detected, TMB-low.  -06/19/2023-current: Lonsurf  and bevacizumab   -09/05/2023: CT CAP: Stable soft tissue nodularity in the sigmoid mesocolon. Slight improvement in peritoneal nodularity along the LEFT pericolic gutter.  No significant change in large complex cystic mass in the RIGHT lower quadrant presumably originating from the RIGHT ovary. -09/11/2023: CEA: 306 - 10/26/2023: CT CAP: No significant change in a bulky multicystic mass arising from the right ovary, measuring 14.4 x 11.9 cm. Unchanged metastatic soft tissue mass or lymph node partially encasing the inferior mesenteric artery in the sigmoid mesocolon measuring 1.9 x 1.8 cm. Unchanged tiny pulmonary nodules, nonspecific although most likely benign and incidental.   Current Treatment:  Lonsurf  and bevacizumab    INTERVAL HISTORY:   Diana Fox is a 52 year old female with colon cancer who presents for follow-up of her colon carcinoma. She is unaccompanied today.  Diana Fox states that she feels good overall. She denies any numbness, tingling, nausea, or vomiting, diarrhea.   She is taking her Lonsurf  as prescribed.  She started this cycle on Monday.  She denies fatigue at this time.  I have reviewed the past medical history, past surgical history, social history and family history with the patient and they are unchanged from previous note.  ALLERGIES:  is allergic to zithromax  [azithromycin ], motrin [ibuprofen], sudafed [pseudoephedrine  hcl], and zestril [lisinopril].  MEDICATIONS:  Current Outpatient Medications  Medication Sig Dispense Refill  amLODipine  (NORVASC ) 10 MG tablet Take 1 tablet (10 mg total) by mouth daily. 90 tablet 1   cetirizine (ZYRTEC) 10 MG tablet SMARTSIG:1 Tablet(s) By Mouth Every Evening     fexofenadine  (ALLEGRA ) 180 MG tablet Take 1 tablet (180 mg total) by mouth daily. 30 tablet 0   fluticasone  (FLONASE ) 50 MCG/ACT nasal spray Place 2 sprays into both nostrils daily. 16 g 12   magnesium  oxide (MAG-OX) 400 (240 Mg) MG tablet Take 1 tablet (400 mg total) by mouth 2 (two) times daily. 60 tablet 6   medroxyPROGESTERone (DEPO-PROVERA) 150 MG/ML injection Inject 150 mg into the muscle every 3 (three) months.     Multiple Vitamin (MULTIVITAMIN) tablet Take 1 tablet by mouth daily.     nebivolol  (BYSTOLIC ) 10 MG tablet Take 10 mg by mouth daily.     pantoprazole  (PROTONIX ) 40 MG tablet Take 40 mg by mouth daily as needed (acid reflux).     potassium chloride  SA (KLOR-CON  M20) 20 MEQ tablet Take 2 tablets (40 mEq total) by mouth 2 (two) times daily. 360 tablet 3   prochlorperazine  (COMPAZINE ) 10 MG tablet Take 1 tablet (10 mg total) by mouth every 6 (six) hours as needed for nausea or vomiting. 60 tablet 2   trifluridine -tipiracil  (LONSURF ) 20-8.19 MG tablet Take 3 tablets (60 mg of trifluridine  total) by mouth 2 (two) times daily after a meal. Take within 1 hr after AM & PM meals on days 1-5, 8-12. Repeat every 28 days. 60 tablet 2   No current facility-administered medications for this visit.    REVIEW OF SYSTEMS:   Constitutional: Denies fevers, chills or abnormal weight loss Eyes: Denies blurriness of vision Ears, nose, mouth, throat, and face: Denies mucositis or sore throat Respiratory: Denies cough, dyspnea or wheezes Cardiovascular: Denies palpitation, chest discomfort or lower extremity swelling Gastrointestinal:  Denies nausea, heartburn or change in bowel habits Skin: Denies abnormal skin rashes Lymphatics: Denies new lymphadenopathy or easy bruising Neurological:Denies numbness, tingling or new  weaknesses Behavioral/Psych: Mood is stable, no new changes  All other systems were reviewed with the patient and are negative.   VITALS:  Blood pressure (!) 142/81, pulse 86, temperature 98.3 F (36.8 C), temperature source Tympanic, resp. rate 18, SpO2 100%.  Wt Readings from Last 3 Encounters:  12/05/23 164 lb 14.5 oz (74.8 kg)  11/22/23 165 lb 5.5 oz (75 kg)  11/08/23 165 lb 5.5 oz (75 kg)    There is no height or weight on file to calculate BMI.  Performance status (ECOG): 0 - Asymptomatic  PHYSICAL EXAM:   GENERAL:alert, no distress and comfortable SKIN: skin color, texture, turgor are normal, no rashes or significant lesions LYMPH:  no palpable lymphadenopathy in the cervical, axillary or inguinal LUNGS: clear to auscultation and percussion with normal breathing effort HEART: regular rate & rhythm and no murmurs and no lower extremity edema ABDOMEN:abdomen soft, non-tender and normal bowel sounds, left colostomy bag present Musculoskeletal:no cyanosis of digits and no clubbing  NEURO: alert & oriented x 3 with fluent speech  LABORATORY DATA:  I have reviewed the data as listed  Lab Results  Component Value Date   WBC 2.2 (L) 12/05/2023   NEUTROABS 0.5 (L) 12/05/2023   HGB 9.3 (L) 12/05/2023   HCT 27.4 (L) 12/05/2023   MCV 103.4 (H) 12/05/2023   PLT 167 12/05/2023     Chemistry      Component Value Date/Time   NA 140 12/05/2023 0816   K 3.9 12/05/2023 0816  CL 109 12/05/2023 0816   CO2 20 (L) 12/05/2023 0816   BUN 9 12/05/2023 0816   CREATININE 0.68 12/05/2023 0816      Component Value Date/Time   CALCIUM  8.7 (L) 12/05/2023 0816   ALKPHOS 61 12/05/2023 0816   AST 22 12/05/2023 0816   ALT 11 12/05/2023 0816   BILITOT 0.7 12/05/2023 0816       Latest Reference Range & Units 10/10/23 09:28  Iron  28 - 170 ug/dL 83  UIBC ug/dL 828  TIBC 749 - 549 ug/dL 745  Saturation Ratios 10.4 - 31.8 % 33 (H)  Ferritin 11 - 307 ng/mL 176  Folate >5.9 ng/mL 16.2   Vitamin B12 180 - 914 pg/mL 543  (H): Data is abnormally high   Latest Reference Range & Units 12/05/23 08:16  CEA 0.0 - 4.7 ng/mL 265.0 (H)  (H): Data is abnormally high   Latest Reference Range & Units 12/05/23 08:25  Appearance CLEAR  HAZY !  Bilirubin Urine NEGATIVE  NEGATIVE  Color, Urine YELLOW  YELLOW  Glucose, UA NEGATIVE mg/dL NEGATIVE  Hgb urine dipstick NEGATIVE  NEGATIVE  Ketones, ur NEGATIVE mg/dL NEGATIVE  Leukocytes,Ua NEGATIVE  TRACE !  Nitrite NEGATIVE  NEGATIVE  pH 5.0 - 8.0  5.0  Protein NEGATIVE mg/dL 30 !  Specific Gravity, Urine 1.005 - 1.030  1.016  !: Data is abnormal  RADIOGRAPHIC STUDIES: I have personally reviewed the radiological images as listed and agreed with the findings in the report.

## 2023-12-07 ENCOUNTER — Other Ambulatory Visit: Payer: Self-pay

## 2023-12-12 ENCOUNTER — Ambulatory Visit: Admitting: Orthopedic Surgery

## 2023-12-15 ENCOUNTER — Encounter: Payer: Self-pay | Admitting: Oncology

## 2023-12-15 ENCOUNTER — Other Ambulatory Visit (INDEPENDENT_AMBULATORY_CARE_PROVIDER_SITE_OTHER): Payer: Self-pay

## 2023-12-15 ENCOUNTER — Encounter: Payer: Self-pay | Admitting: Orthopedic Surgery

## 2023-12-15 ENCOUNTER — Encounter (HOSPITAL_COMMUNITY): Payer: Self-pay | Admitting: Oncology

## 2023-12-15 ENCOUNTER — Ambulatory Visit (INDEPENDENT_AMBULATORY_CARE_PROVIDER_SITE_OTHER): Admitting: Orthopedic Surgery

## 2023-12-15 VITALS — BP 147/91 | HR 87 | Ht 65.0 in | Wt 164.0 lb

## 2023-12-15 DIAGNOSIS — M545 Low back pain, unspecified: Secondary | ICD-10-CM | POA: Diagnosis not present

## 2023-12-15 DIAGNOSIS — M25511 Pain in right shoulder: Secondary | ICD-10-CM

## 2023-12-15 DIAGNOSIS — G8929 Other chronic pain: Secondary | ICD-10-CM

## 2023-12-15 NOTE — Progress Notes (Signed)
 New Patient Visit  Assessment: Diana Fox is a 53 y.o. female with the following: 1. Lumbar pain 2. Chronic right shoulder pain  Plan: Diana Fox has chronic lower back and right shoulder pain.  She has good range of motion of the right shoulder, as well as good strength.  Pain is over the posterior aspect of the shoulder, assistant with the muscle belly.  She has axial low back pain, without radicular pain or symptoms.  More can she has not taken medications consistently.  She has not worked with physical therapy.  I recommended therapy based on her current presentation.  She is in agreement with this plan.  She will follow-up as needed.  Follow-up: Return if symptoms worsen or fail to improve.  Subjective:  Chief Complaint  Patient presents with   Back Pain    LBP after an injury 10 yrs ago. Pt states pain is steady and doesn't go down the leg.    Shoulder Pain    R from same injury    History of Present Illness: Diana Fox is a 53 y.o. female who has been referred by Comer Raymond, PA-C for evaluation of low back pain.  She states that she injured her low back approximately 10 years ago while at work.  It has not improved since the initial injury.  She was previously evaluated by an orthopedic surgeon, and provided with medications.  She did not see improvement in her symptoms.  She has not worked with therapy.  No prior injections.  At the same time, she states that she has had pain in the right shoulder, primarily posterior, since this injury at work.  Her motion is okay.  No numbness or tingling.  She denies radiating pains into her legs or into the right arm.  No issues with bowel or bladder function.   Review of Systems: No fevers or chills No numbness or tingling No chest pain No shortness of breath No bowel or bladder dysfunction No GI distress No headaches   Medical History:  Past Medical History:  Diagnosis Date   Anemia    Back pain    Hypertension     Port-A-Cath in place 05/10/2021    Past Surgical History:  Procedure Laterality Date   BIOPSY  03/02/2021   Procedure: BIOPSY;  Surgeon: Cindie Carlin POUR, DO;  [facility]: [facility];  Service: Endoscopy;;   BOWEL RESECTION N/A 03/26/2021   Procedure: SMALL BOWEL RESECTION;  Surgeon: Mavis Anes, MD;  Location: [facility];  Service: General;  Laterality: N/A;   CHOLECYSTECTOMY     COLONOSCOPY WITH PROPOFOL  N/A 10/04/2021   Procedure: COLONOSCOPY WITH PROPOFOL ;  Surgeon: Cindie Carlin POUR, DO;  [facility]: [facility];  Service: Endoscopy;  Laterality: N/A;  12:15PM, VIA OSTOMY   COLOSTOMY N/A 03/26/2021   Procedure: COLOSTOMY;  Surgeon: Mavis Anes, MD;  Location: [facility];  Service: General;  Laterality: N/A;   ESOPHAGOGASTRODUODENOSCOPY (EGD) WITH PROPOFOL  N/A 03/02/2021   Procedure: ESOPHAGOGASTRODUODENOSCOPY (EGD) WITH PROPOFOL ;  Surgeon: Cindie Carlin POUR, DO;  [facility]: [facility];  Service: Endoscopy;  Laterality: N/A;   PARTIAL COLECTOMY N/A 03/26/2021   Procedure: PARTIAL COLECTOMY;  Surgeon: Mavis Anes, MD;  Location: [facility];  Service: General;  Laterality: N/A;   PORTACATH PLACEMENT Left 04/14/2021   Procedure: INSERTION PORT-A-CATH;  Surgeon: Mavis Anes, MD;  Location: [facility];  Service: General;  Laterality: Left;   SCLEROTHERAPY  03/02/2021   Procedure: MATIAS;  Surgeon: Cindie Carlin POUR, DO;  [facility]: [facility];  Service: Endoscopy;;   SIGMOIDOSCOPY  03/02/2021   Procedure: SIGMOIDOSCOPY;  Surgeon: Cindie Carlin POUR, DO;  [facility]: [facility];  Service: Endoscopy;;    Family History  Problem Relation Age of Onset   Hypertension Mother    Hypertension Father    Colon cancer Neg Hx    Colon polyps Neg Hx    Inflammatory bowel disease Neg Hx    Social History   Tobacco Use   Smoking status: Never   Smokeless tobacco: Never  Vaping Use   Vaping status: Never Used  Substance Use Topics   Alcohol use: No   Drug use: No    Allergies  Allergen  Reactions   Zithromax  [Azithromycin ] Hives    infusing arm began to swell , her face got red, and she broke out, there are bumps around where the IV site was infusing   Motrin [Ibuprofen] Itching   Sudafed [Pseudoephedrine  Hcl] Other (See Comments)    Hypertension   Zestril [Lisinopril] Cough    Current Meds  Medication Sig   amLODipine  (NORVASC ) 10 MG tablet Take 1 tablet (10 mg total) by mouth daily.   cetirizine (ZYRTEC) 10 MG tablet SMARTSIG:1 Tablet(s) By Mouth Every Evening   fexofenadine  (ALLEGRA ) 180 MG tablet Take 1 tablet (180 mg total) by mouth daily.   fluticasone  (FLONASE ) 50 MCG/ACT nasal spray Place 2 sprays into both nostrils daily.   magnesium  oxide (MAG-OX) 400 (240 Mg) MG tablet Take 1 tablet (400 mg total) by mouth 2 (two) times daily.   medroxyPROGESTERone (DEPO-PROVERA) 150 MG/ML injection Inject 150 mg into the muscle every 3 (three) months.   Multiple Vitamin (MULTIVITAMIN) tablet Take 1 tablet by mouth daily.   nebivolol  (BYSTOLIC ) 10 MG tablet Take 10 mg by mouth daily.   pantoprazole  (PROTONIX ) 40 MG tablet Take 40 mg by mouth daily as needed (acid reflux).   potassium chloride  SA (KLOR-CON  M20) 20 MEQ tablet Take 2 tablets (40 mEq total) by mouth 2 (two) times daily.   prochlorperazine  (COMPAZINE ) 10 MG tablet Take 1 tablet (10 mg total) by mouth every 6 (six) hours as needed for nausea or vomiting.   trifluridine -tipiracil  (LONSURF ) 20-8.19 MG tablet Take 3 tablets (60 mg of trifluridine  total) by mouth 2 (two) times daily after a meal. Take within 1 hr after AM & PM meals on days 1-5, 8-12. Repeat every 28 days.    Objective: BP (!) 147/91   Pulse 87   Ht 5' 5 (1.651 m)   Wt 164 lb (74.4 kg)   BMI 27.29 kg/m   Physical Exam:  General: Alert and oriented. and No acute distress. Gait: Normal gait.  Tenderness in the lower back.  Negative straight leg raise bilateral.  Some lower body strength bilaterally.  Sensation is intact in the lower  extremities.  2+ patellar tendon reflexes bilaterally.  No pain with internal or external rotation of bilateral hips.  She has a tenderness over the inferior medial border of the scapula.  She has good range of motion of the right shoulder.  She has good strength.  Strength testing elicits some discomfort.  Shoulder moves freely, without discomfort.  IMAGING: I personally ordered and reviewed the following images   X-ray of the right shoulder obtained in clinic today.  No acute injuries noted.  No evidence of proximal humeral migration.  Very small inferior osteophyte at the humeral head.  No bony lesions.  No evidence of chronic injury.  Impression: Negative right shoulder x-ray  X-rays of the lumbar spine were obtained in clinic today.  No acute injuries noted.  No anterolisthesis.  Well-maintained disc height.  In the inferior lumbar spine, there are some small anterior based osteophytes.  No bony lesions.  Impression: Lumbar spine x-rays with mild degenerative changes   New Medications:  No orders of the defined types were placed in this encounter.     Oneil DELENA Horde, MD  12/15/2023 11:11 AM

## 2023-12-15 NOTE — Patient Instructions (Signed)
 Recommend physical therapy for your back and right shoulder pain.

## 2023-12-18 ENCOUNTER — Other Ambulatory Visit: Payer: Self-pay

## 2023-12-18 NOTE — Progress Notes (Signed)
 Specialty Pharmacy Refill Coordination Note  Diana Fox is a 53 y.o. female contacted today regarding refills of specialty medication(s) Trifluridine -Tipiracil  (Lonsurf )   Patient requested Delivery   Delivery date: 12/27/23   Verified address: [address]    [place] [address]   Medication will be filled on: 12/26/23

## 2023-12-18 NOTE — Progress Notes (Signed)
 Specialty Pharmacy Ongoing Clinical Assessment Note  Diana Fox is a 53 y.o. female who is being followed by the specialty pharmacy service for RxSp Oncology   Patient's specialty medication(s) reviewed today: Trifluridine -Tipiracil  (Lonsurf )   Missed doses in the last 4 weeks: 0   Patient/Caregiver did not have any additional questions or concerns.   Therapeutic benefit summary: Patient is achieving benefit   Adverse events/side effects summary: No adverse events/side effects   Patient's therapy is appropriate to: Continue    Goals Addressed             This Visit's Progress    Slow Disease Progression   On track    Patient is on track. Patient will maintain adherence.  Most recent CEA had come down to 265.0 on 12/05/23 and the next scan is planned for 01/2024.         Follow up: 3 months  Silvano LOISE Dolly Specialty Pharmacist

## 2023-12-20 ENCOUNTER — Inpatient Hospital Stay: Attending: Hematology

## 2023-12-20 ENCOUNTER — Inpatient Hospital Stay

## 2023-12-20 VITALS — BP 125/80 | HR 79

## 2023-12-20 DIAGNOSIS — Z95828 Presence of other vascular implants and grafts: Secondary | ICD-10-CM

## 2023-12-20 DIAGNOSIS — C189 Malignant neoplasm of colon, unspecified: Secondary | ICD-10-CM

## 2023-12-20 LAB — CBC WITH DIFFERENTIAL/PLATELET
Basophils Absolute: 0 K/uL (ref 0.0–0.1)
Basophils Relative: 0 %
Eosinophils Absolute: 0 K/uL (ref 0.0–0.5)
Eosinophils Relative: 1 %
HCT: 21.7 % — ABNORMAL LOW (ref 36.0–46.0)
Hemoglobin: 7.6 g/dL — ABNORMAL LOW (ref 12.0–15.0)
Lymphocytes Relative: 61 %
Lymphs Abs: 1 K/uL (ref 0.7–4.0)
MCH: 35.3 pg — ABNORMAL HIGH (ref 26.0–34.0)
MCHC: 35 g/dL (ref 30.0–36.0)
MCV: 100.9 fL — ABNORMAL HIGH (ref 80.0–100.0)
Monocytes Absolute: 0 K/uL — ABNORMAL LOW (ref 0.1–1.0)
Monocytes Relative: 1 %
Neutro Abs: 0.6 K/uL — ABNORMAL LOW (ref 1.7–7.7)
Neutrophils Relative %: 37 %
Platelets: 79 K/uL — ABNORMAL LOW (ref 150–400)
RBC: 2.15 MIL/uL — ABNORMAL LOW (ref 3.87–5.11)
RDW: 15.9 % — ABNORMAL HIGH (ref 11.5–15.5)
WBC: 1.7 K/uL — ABNORMAL LOW (ref 4.0–10.5)
nRBC: 0 % (ref 0.0–0.2)

## 2023-12-20 LAB — PREGNANCY, URINE: Preg Test, Ur: NEGATIVE

## 2023-12-20 LAB — COMPREHENSIVE METABOLIC PANEL WITH GFR
ALT: 6 U/L (ref 0–44)
AST: 18 U/L (ref 15–41)
Albumin: 3.9 g/dL (ref 3.5–5.0)
Alkaline Phosphatase: 57 U/L (ref 38–126)
Anion gap: 11 (ref 5–15)
BUN: 7 mg/dL (ref 6–20)
CO2: 20 mmol/L — ABNORMAL LOW (ref 22–32)
Calcium: 8.8 mg/dL — ABNORMAL LOW (ref 8.9–10.3)
Chloride: 110 mmol/L (ref 98–111)
Creatinine, Ser: 0.67 mg/dL (ref 0.44–1.00)
GFR, Estimated: >60 mL/min (ref >60)
Glucose, Bld: 106 mg/dL — ABNORMAL HIGH (ref 70–99)
Potassium: 4 mmol/L (ref 3.5–5.1)
Sodium: 141 mmol/L (ref 135–145)
Total Bilirubin: 0.4 mg/dL (ref 0.0–1.2)
Total Protein: 7 g/dL (ref 6.5–8.1)

## 2023-12-20 LAB — MAGNESIUM: Magnesium: 1.9 mg/dL (ref 1.7–2.4)

## 2023-12-20 MED ORDER — SODIUM CHLORIDE 0.9 % IV SOLN: INTRAVENOUS | Status: DC

## 2023-12-20 MED ORDER — SODIUM CHLORIDE 0.9 % IV SOLN
5.0000 mg/kg | Freq: Once | INTRAVENOUS | Status: AC
  Administered 2023-12-20: 400 mg via INTRAVENOUS
  Filled 2023-12-20: qty 16

## 2023-12-20 NOTE — Patient Instructions (Signed)
 [facility] [place] - A DEPT OF [facility]  Discharge Instructions: Thank you for choosing [facility] to provide your oncology and hematology care.  If you have a lab appointment with the Cancer Center - please note that after April 8th, 2024, all labs will be drawn in the cancer center.  You do not have to check in or register with the main entrance as you have in the past but will complete your check-in in the cancer center.  Wear comfortable clothing and clothing appropriate for easy access to any Portacath or PICC line.   We strive to give you quality time with your provider. You may need to reschedule your appointment if you arrive late (15 or more minutes).  Arriving late affects you and other patients whose appointments are after yours.  Also, if you miss three or more appointments without notifying the office, you may be dismissed from the clinic at the provider's discretion.      For prescription refill requests, have your pharmacy contact our office and allow 72 hours for refills to be completed.    Today you received the following chemotherapy and/or immunotherapy agents Vegzelma    To help prevent nausea and vomiting after your treatment, we encourage you to take your nausea medication as directed.  BELOW ARE SYMPTOMS THAT SHOULD BE REPORTED IMMEDIATELY: *FEVER GREATER THAN 100.4 F (38 C) OR HIGHER *CHILLS OR SWEATING *NAUSEA AND VOMITING THAT IS NOT CONTROLLED WITH YOUR NAUSEA MEDICATION *UNUSUAL SHORTNESS OF BREATH *UNUSUAL BRUISING OR BLEEDING *URINARY PROBLEMS (pain or burning when urinating, or frequent urination) *BOWEL PROBLEMS (unusual diarrhea, constipation, pain near the anus) TENDERNESS IN MOUTH AND THROAT WITH OR WITHOUT PRESENCE OF ULCERS (sore throat, sores in mouth, or a toothache) UNUSUAL RASH, SWELLING OR PAIN  UNUSUAL VAGINAL DISCHARGE OR ITCHING   Items with * indicate a potential emergency and should be followed up as  soon as possible or go to the Emergency Department if any problems should occur.  Please show the CHEMOTHERAPY ALERT CARD or IMMUNOTHERAPY ALERT CARD at check-in to the Emergency Department and triage nurse.  Should you have questions after your visit or need to cancel or reschedule your appointment, please contact [facility] [place] - A DEPT OF [facility] [phone]  and follow the prompts.  Office hours are 8:00 a.m. to 4:30 p.m. Monday - Friday. Please note that voicemails left after 4:00 p.m. may not be returned until the following business day.  We are closed weekends and major holidays. You have access to a nurse at all times for urgent questions. Please call the main number to the clinic [phone] and follow the prompts.  For any non-urgent questions, you may also contact your provider using MyChart. We now offer e-Visits for anyone 51 and older to request care online for non-urgent symptoms. For details visit [email or web address].   Also download the MyChart app! Go to the app store, search "MyChart", open the app, select [facility], and log in with your MyChart username and password.

## 2023-12-20 NOTE — Progress Notes (Signed)
 Patient presents today for chemotherapy infusion.  Patient is in satisfactory condition with no new complaints voiced.  Vital signs are stable.  Labs reviewed and all other labs are within treatment parameters. Patient's hemoglobin 7.6, platelets 79, and ANC 0.6, Dr.Kandala made aware.  We will proceed with treatment per MD orders.

## 2023-12-20 NOTE — Progress Notes (Signed)
 Patient tolerated chemotherapy with no complaints voiced.  Side effects with management reviewed with understanding verbalized.  Port site clean and dry with no bruising or swelling noted at site.  Good blood return noted before and after administration of chemotherapy.  Band aid applied.  Patient left in satisfactory condition with VSS and no s/s of distress noted. All follow ups as scheduled.   Diana Fox [employer]

## 2023-12-26 ENCOUNTER — Other Ambulatory Visit: Payer: Self-pay

## 2023-12-27 ENCOUNTER — Other Ambulatory Visit: Payer: Self-pay | Admitting: Oncology

## 2023-12-27 DIAGNOSIS — C189 Malignant neoplasm of colon, unspecified: Secondary | ICD-10-CM

## 2023-12-27 DIAGNOSIS — Z95828 Presence of other vascular implants and grafts: Secondary | ICD-10-CM

## 2024-01-02 ENCOUNTER — Telehealth: Payer: Self-pay | Admitting: Orthopedic Surgery

## 2024-01-02 NOTE — Telephone Encounter (Signed)
 Dr. Onesimo pt - spoke w/the pt, she is requesting pain patches for her back to be sent to [facility] [place].  [phone]

## 2024-01-03 ENCOUNTER — Inpatient Hospital Stay

## 2024-01-03 ENCOUNTER — Inpatient Hospital Stay: Admitting: Oncology

## 2024-01-03 ENCOUNTER — Other Ambulatory Visit: Payer: Self-pay

## 2024-01-03 MED ORDER — LIDOCAINE 4 % EX PTCH: 1.0000 | MEDICATED_PATCH | CUTANEOUS | 0 refills | Status: AC

## 2024-01-10 ENCOUNTER — Inpatient Hospital Stay

## 2024-01-10 ENCOUNTER — Inpatient Hospital Stay: Attending: Hematology

## 2024-01-10 VITALS — BP 121/69 | HR 74 | Temp 98.4°F | Resp 19 | Wt 161.0 lb

## 2024-01-10 DIAGNOSIS — C786 Secondary malignant neoplasm of retroperitoneum and peritoneum: Secondary | ICD-10-CM | POA: Insufficient documentation

## 2024-01-10 DIAGNOSIS — C189 Malignant neoplasm of colon, unspecified: Secondary | ICD-10-CM

## 2024-01-10 DIAGNOSIS — Z95828 Presence of other vascular implants and grafts: Secondary | ICD-10-CM

## 2024-01-10 DIAGNOSIS — Z5112 Encounter for antineoplastic immunotherapy: Secondary | ICD-10-CM | POA: Insufficient documentation

## 2024-01-10 DIAGNOSIS — K648 Other hemorrhoids: Secondary | ICD-10-CM | POA: Diagnosis not present

## 2024-01-10 DIAGNOSIS — D6481 Anemia due to antineoplastic chemotherapy: Secondary | ICD-10-CM | POA: Insufficient documentation

## 2024-01-10 DIAGNOSIS — N938 Other specified abnormal uterine and vaginal bleeding: Secondary | ICD-10-CM | POA: Diagnosis not present

## 2024-01-10 DIAGNOSIS — C187 Malignant neoplasm of sigmoid colon: Secondary | ICD-10-CM | POA: Diagnosis not present

## 2024-01-10 DIAGNOSIS — T451X5A Adverse effect of antineoplastic and immunosuppressive drugs, initial encounter: Secondary | ICD-10-CM | POA: Diagnosis not present

## 2024-01-10 DIAGNOSIS — C7800 Secondary malignant neoplasm of unspecified lung: Secondary | ICD-10-CM | POA: Insufficient documentation

## 2024-01-10 DIAGNOSIS — Z79899 Other long term (current) drug therapy: Secondary | ICD-10-CM | POA: Insufficient documentation

## 2024-01-10 DIAGNOSIS — Z793 Long term (current) use of hormonal contraceptives: Secondary | ICD-10-CM | POA: Insufficient documentation

## 2024-01-10 DIAGNOSIS — Z933 Colostomy status: Secondary | ICD-10-CM | POA: Diagnosis not present

## 2024-01-10 LAB — COMPREHENSIVE METABOLIC PANEL WITH GFR
ALT: 7 U/L (ref 0–44)
AST: 22 U/L (ref 15–41)
Albumin: 4.1 g/dL (ref 3.5–5.0)
Alkaline Phosphatase: 50 U/L (ref 38–126)
Anion gap: 12 (ref 5–15)
BUN: 9 mg/dL (ref 6–20)
CO2: 18 mmol/L — ABNORMAL LOW (ref 22–32)
Calcium: 8.9 mg/dL (ref 8.9–10.3)
Chloride: 110 mmol/L (ref 98–111)
Creatinine, Ser: 0.82 mg/dL (ref 0.44–1.00)
GFR, Estimated: >60 mL/min (ref >60)
Glucose, Bld: 116 mg/dL — ABNORMAL HIGH (ref 70–99)
Potassium: 3.8 mmol/L (ref 3.5–5.1)
Sodium: 140 mmol/L (ref 135–145)
Total Bilirubin: 0.6 mg/dL (ref 0.0–1.2)
Total Protein: 7.2 g/dL (ref 6.5–8.1)

## 2024-01-10 LAB — CBC WITH DIFFERENTIAL/PLATELET
Abs Immature Granulocytes: 0.03 K/uL (ref 0.00–0.07)
Basophils Absolute: 0 K/uL (ref 0.0–0.1)
Basophils Relative: 1 %
Eosinophils Absolute: 0 K/uL (ref 0.0–0.5)
Eosinophils Relative: 0 %
HCT: 23.3 % — ABNORMAL LOW (ref 36.0–46.0)
Hemoglobin: 7.9 g/dL — ABNORMAL LOW (ref 12.0–15.0)
Immature Granulocytes: 1 %
Lymphocytes Relative: 34 %
Lymphs Abs: 1.3 K/uL (ref 0.7–4.0)
MCH: 35.1 pg — ABNORMAL HIGH (ref 26.0–34.0)
MCHC: 33.9 g/dL (ref 30.0–36.0)
MCV: 103.6 fL — ABNORMAL HIGH (ref 80.0–100.0)
Monocytes Absolute: 0.3 K/uL (ref 0.1–1.0)
Monocytes Relative: 9 %
Neutro Abs: 2 K/uL (ref 1.7–7.7)
Neutrophils Relative %: 55 %
Platelets: 229 K/uL (ref 150–400)
RBC: 2.25 MIL/uL — ABNORMAL LOW (ref 3.87–5.11)
RDW: 17.9 % — ABNORMAL HIGH (ref 11.5–15.5)
WBC: 3.7 K/uL — ABNORMAL LOW (ref 4.0–10.5)
nRBC: 0 % (ref 0.0–0.2)

## 2024-01-10 LAB — URINALYSIS, DIPSTICK ONLY
Bilirubin Urine: NEGATIVE
Glucose, UA: NEGATIVE mg/dL
Ketones, ur: NEGATIVE mg/dL
Leukocytes,Ua: NEGATIVE
Nitrite: NEGATIVE
Protein, ur: NEGATIVE mg/dL
Specific Gravity, Urine: 1.009 (ref 1.005–1.030)
pH: 5 (ref 5.0–8.0)

## 2024-01-10 LAB — MAGNESIUM: Magnesium: 1.7 mg/dL (ref 1.7–2.4)

## 2024-01-10 MED ORDER — SODIUM CHLORIDE 0.9 % IV SOLN: INTRAVENOUS | Status: DC

## 2024-01-10 MED ORDER — SODIUM CHLORIDE 0.9 % IV SOLN
5.0000 mg/kg | Freq: Once | INTRAVENOUS | Status: AC
  Administered 2024-01-10: 400 mg via INTRAVENOUS
  Filled 2024-01-10: qty 16

## 2024-01-10 NOTE — Progress Notes (Signed)
 Patient presents today for chemotherapy Vegzelma  infusion.  Patient is in satisfactory condition with no new complaints voiced. Vital signs are stable.  Labs reviewed and all other labs are within treatment parameters. Patient's hemoglobin noted to be 7.9. Patient stated she felt good and voiced no complaints of headaches, dizziness, or shortness of breath. Dr.Kandala made aware. No blood needed due to patient being asymptomatic per Dr.Kandala. Patient also stated she does not want to receive blood today. We will proceed with treatment per MD orders.    Treatment given today per MD orders. Tolerated infusion without adverse affects. Vital signs stable. No complaints at this time. Discharged from clinic ambulatory in stable condition. Alert and oriented x 3. F/U with [facility] as scheduled.

## 2024-01-10 NOTE — Patient Instructions (Signed)
 [facility] [place] - A DEPT OF [facility]  Discharge Instructions: Thank you for choosing  Cancer Center to provide your oncology and hematology care.  If you have a lab appointment with the Cancer Center - please note that after April 8th, 2024, all labs will be drawn in the cancer center.  You do not have to check in or register with the main entrance as you have in the past but will complete your check-in in the cancer center.  Wear comfortable clothing and clothing appropriate for easy access to any Portacath or PICC line.   We strive to give you quality time with your provider. You may need to reschedule your appointment if you arrive late (15 or more minutes).  Arriving late affects you and other patients whose appointments are after yours.  Also, if you miss three or more appointments without notifying the office, you may be dismissed from the clinic at the provider's discretion.      For prescription refill requests, have your pharmacy contact our office and allow 72 hours for refills to be completed.    Today you received the following chemotherapy and/or immunotherapy agents Avastin   To help prevent nausea and vomiting after your treatment, we encourage you to take your nausea medication as directed.  Bevacizumab Injection What is this medication? BEVACIZUMAB (be va SIZ yoo mab) treats some types of cancer. It works by blocking a protein that causes cancer cells to grow and multiply. This helps to slow or stop the spread of cancer cells. It is a monoclonal antibody. This medicine may be used for other purposes; ask your health care provider or pharmacist if you have questions. COMMON BRAND NAME(S): Alymsys, Avastin, MVASI, Rosaland Lao What should I tell my care team before I take this medication? They need to know if you have any of these conditions: Blood clots Coughing up blood Having or recent surgery Heart failure High blood  pressure History of a connection between 2 or more body parts that do not usually connect (fistula) History of a tear in your stomach or intestines Protein in your urine An unusual or allergic reaction to bevacizumab, other medications, foods, dyes, or preservatives Pregnant or trying to get pregnant Breast-feeding How should I use this medication? This medication is injected into a vein. It is given by your care team in a hospital or clinic setting. Talk to your care team the use of this medication in children. Special care may be needed. Overdosage: If you think you have taken too much of this medicine contact a poison control center or emergency room at once. NOTE: This medicine is only for you. Do not share this medicine with others. What if I miss a dose? Keep appointments for follow-up doses. It is important not to miss your dose. Call your care team if you are unable to keep an appointment. What may interact with this medication? Interactions are not expected. This list may not describe all possible interactions. Give your health care provider a list of all the medicines, herbs, non-prescription drugs, or dietary supplements you use. Also tell them if you smoke, drink alcohol, or use illegal drugs. Some items may interact with your medicine. What should I watch for while using this medication? Your condition will be monitored carefully while you are receiving this medication. You may need blood work while taking this medication. This medication may make you feel generally unwell. This is not uncommon as chemotherapy can  affect healthy cells as well as cancer cells. Report any side effects. Continue your course of treatment even though you feel ill unless your care team tells you to stop. This medication may increase your risk to bruise or bleed. Call your care team if you notice any unusual bleeding. Before having surgery, talk to your care team to make sure it is ok. This medication can  increase the risk of poor healing of your surgical site or wound. You will need to stop this medication for 28 days before surgery. After surgery, wait at least 28 days before restarting this medication. Make sure the surgical site or wound is healed enough before restarting this medication. Talk to your care team if questions. Talk to your care team if you may be pregnant. Serious birth defects can occur if you take this medication during pregnancy and for 6 months after the last dose. Contraception is recommended while taking this medication and for 6 months after the last dose. Your care team can help you find the option that works for you. Do not breastfeed while taking this medication and for 6 months after the last dose. This medication can cause infertility. Talk to your care team if you are concerned about your fertility. What side effects may I notice from receiving this medication? Side effects that you should report to your care team as soon as possible: Allergic reactions--skin rash, itching, hives, swelling of the face, lips, tongue, or throat Bleeding--bloody or black, tar-like stools, vomiting blood or brown material that looks like coffee grounds, red or dark brown urine, small red or purple spots on skin, unusual bruising or bleeding Blood clot--pain, swelling, or warmth in the leg, shortness of breath, chest pain Heart attack--pain or tightness in the chest, shoulders, arms, or jaw, nausea, shortness of breath, cold or clammy skin, feeling faint or lightheaded Heart failure--shortness of breath, swelling of the ankles, feet, or hands, sudden weight gain, unusual weakness or fatigue Increase in blood pressure Infection--fever, chills, cough, sore throat, wounds that don't heal, pain or trouble when passing urine, general feeling of discomfort or being unwell Infusion reactions--chest pain, shortness of breath or trouble breathing, feeling faint or lightheaded Kidney injury--decrease in  the amount of urine, swelling of the ankles, hands, or feet Stomach pain that is severe, does not go away, or gets worse Stroke--sudden numbness or weakness of the face, arm, or leg, trouble speaking, confusion, trouble walking, loss of balance or coordination, dizziness, severe headache, change in vision Sudden and severe headache, confusion, change in vision, seizures, which may be signs of posterior reversible encephalopathy syndrome (PRES) Side effects that usually do not require medical attention (report to your care team if they continue or are bothersome): Back pain Change in taste Diarrhea Dry skin Increased tears Nosebleed This list may not describe all possible side effects. Call your doctor for medical advice about side effects. You may report side effects to FDA at 1-800-FDA-1088. Where should I keep my medication? This medication is given in a hospital or clinic. It will not be stored at home. NOTE: This sheet is a summary. It may not cover all possible information. If you have questions about this medicine, talk to your doctor, pharmacist, or health care provider.  2024 Elsevier/Gold Standard (2021-06-11 00:00:00)  BELOW ARE SYMPTOMS THAT SHOULD BE REPORTED IMMEDIATELY: *FEVER GREATER THAN 100.4 F (38 C) OR HIGHER *CHILLS OR SWEATING *NAUSEA AND VOMITING THAT IS NOT CONTROLLED WITH YOUR NAUSEA MEDICATION *UNUSUAL SHORTNESS OF BREATH *UNUSUAL BRUISING  OR BLEEDING *URINARY PROBLEMS (pain or burning when urinating, or frequent urination) *BOWEL PROBLEMS (unusual diarrhea, constipation, pain near the anus) TENDERNESS IN MOUTH AND THROAT WITH OR WITHOUT PRESENCE OF ULCERS (sore throat, sores in mouth, or a toothache) UNUSUAL RASH, SWELLING OR PAIN  UNUSUAL VAGINAL DISCHARGE OR ITCHING   Items with * indicate a potential emergency and should be followed up as soon as possible or go to the Emergency Department if any problems should occur.  Please show the CHEMOTHERAPY ALERT  CARD or IMMUNOTHERAPY ALERT CARD at check-in to the Emergency Department and triage nurse.  Should you have questions after your visit or need to cancel or reschedule your appointment, please contact [facility] [place] - A DEPT OF [facility] [phone]  and follow the prompts.  Office hours are 8:00 a.m. to 4:30 p.m. Monday - Friday. Please note that voicemails left after 4:00 p.m. may not be returned until the following business day.  We are closed weekends and major holidays. You have access to a nurse at all times for urgent questions. Please call the main number to the clinic [phone] and follow the prompts.  For any non-urgent questions, you may also contact your provider using MyChart. We now offer e-Visits for anyone 55 and older to request care online for non-urgent symptoms. For details visit [email or web address].   Also download the MyChart app! Go to the app store, search "MyChart", open the app, select [place], and log in with your MyChart username and password.

## 2024-01-17 ENCOUNTER — Inpatient Hospital Stay

## 2024-01-18 ENCOUNTER — Other Ambulatory Visit (HOSPITAL_COMMUNITY): Payer: Self-pay

## 2024-01-23 ENCOUNTER — Other Ambulatory Visit: Payer: Self-pay

## 2024-01-23 NOTE — Progress Notes (Signed)
 Specialty Pharmacy Refill Coordination Note  Diana Fox is a 53 y.o. female contacted today regarding refills of specialty medication(s) Trifluridine -Tipiracil  (Lonsurf )   Patient requested Delivery   Delivery date: 01/26/24   Verified address: [address]   [place] [address]   Medication will be filled on: 01/25/24

## 2024-01-24 ENCOUNTER — Inpatient Hospital Stay

## 2024-01-24 ENCOUNTER — Inpatient Hospital Stay: Admitting: Oncology

## 2024-01-24 ENCOUNTER — Other Ambulatory Visit: Payer: Self-pay

## 2024-01-24 VITALS — BP 117/59 | HR 84 | Temp 98.3°F | Resp 18

## 2024-01-24 VITALS — Ht 65.0 in | Wt 159.0 lb

## 2024-01-24 DIAGNOSIS — C7989 Secondary malignant neoplasm of other specified sites: Secondary | ICD-10-CM | POA: Diagnosis not present

## 2024-01-24 DIAGNOSIS — T451X5A Adverse effect of antineoplastic and immunosuppressive drugs, initial encounter: Secondary | ICD-10-CM

## 2024-01-24 DIAGNOSIS — C189 Malignant neoplasm of colon, unspecified: Secondary | ICD-10-CM

## 2024-01-24 DIAGNOSIS — D702 Other drug-induced agranulocytosis: Secondary | ICD-10-CM

## 2024-01-24 DIAGNOSIS — Z95828 Presence of other vascular implants and grafts: Secondary | ICD-10-CM

## 2024-01-24 DIAGNOSIS — D6481 Anemia due to antineoplastic chemotherapy: Secondary | ICD-10-CM

## 2024-01-24 DIAGNOSIS — Z5112 Encounter for antineoplastic immunotherapy: Secondary | ICD-10-CM | POA: Diagnosis not present

## 2024-01-24 LAB — CBC WITH DIFFERENTIAL/PLATELET
Basophils Absolute: 0 K/uL (ref 0.0–0.1)
Basophils Relative: 0 %
Eosinophils Absolute: 0 K/uL (ref 0.0–0.5)
Eosinophils Relative: 1 %
HCT: 22.4 % — ABNORMAL LOW (ref 36.0–46.0)
Hemoglobin: 7.4 g/dL — ABNORMAL LOW (ref 12.0–15.0)
Lymphocytes Relative: 61 %
Lymphs Abs: 1.3 K/uL (ref 0.7–4.0)
MCH: 35.1 pg — ABNORMAL HIGH (ref 26.0–34.0)
MCHC: 33 g/dL (ref 30.0–36.0)
MCV: 106.2 fL — ABNORMAL HIGH (ref 80.0–100.0)
Monocytes Absolute: 0.1 K/uL (ref 0.1–1.0)
Monocytes Relative: 6 %
Neutro Abs: 0.7 K/uL — ABNORMAL LOW (ref 1.7–7.7)
Neutrophils Relative %: 32 %
Platelets: 183 K/uL (ref 150–400)
RBC: 2.11 MIL/uL — ABNORMAL LOW (ref 3.87–5.11)
RDW: 19.1 % — ABNORMAL HIGH (ref 11.5–15.5)
Smear Review: NORMAL
WBC: 2.2 K/uL — ABNORMAL LOW (ref 4.0–10.5)
nRBC: 0 % (ref 0.0–0.2)

## 2024-01-24 LAB — COMPREHENSIVE METABOLIC PANEL WITH GFR
ALT: 6 U/L (ref 0–44)
AST: 18 U/L (ref 15–41)
Albumin: 3.8 g/dL (ref 3.5–5.0)
Alkaline Phosphatase: 49 U/L (ref 38–126)
Anion gap: 12 (ref 5–15)
BUN: 6 mg/dL (ref 6–20)
CO2: 18 mmol/L — ABNORMAL LOW (ref 22–32)
Calcium: 8.6 mg/dL — ABNORMAL LOW (ref 8.9–10.3)
Chloride: 110 mmol/L (ref 98–111)
Creatinine, Ser: 0.72 mg/dL (ref 0.44–1.00)
GFR, Estimated: >60 mL/min (ref >60)
Glucose, Bld: 91 mg/dL (ref 70–99)
Potassium: 3.8 mmol/L (ref 3.5–5.1)
Sodium: 140 mmol/L (ref 135–145)
Total Bilirubin: 0.6 mg/dL (ref 0.0–1.2)
Total Protein: 7 g/dL (ref 6.5–8.1)

## 2024-01-24 LAB — MAGNESIUM: Magnesium: 1.9 mg/dL (ref 1.7–2.4)

## 2024-01-24 MED ORDER — SODIUM CHLORIDE 0.9 % IV SOLN: INTRAVENOUS | Status: DC

## 2024-01-24 MED ORDER — SODIUM CHLORIDE 0.9 % IV SOLN
5.0000 mg/kg | Freq: Once | INTRAVENOUS | Status: AC
  Administered 2024-01-24: 11:00:00 400 mg via INTRAVENOUS
  Filled 2024-01-24: qty 16

## 2024-01-24 NOTE — Progress Notes (Signed)
 Patient presents today for chemotherapy infusion of Vegzelma . Patient is in satisfactory condition with no new complaints voiced.  Vital signs are stable.  Labs reviewed by Dr. Davonna during the office visit and all labs are within treatment parameters with exception of hgb 7.4 and ANC 0.Urine protein negative on 01/10/24. Per Dr Armanda okay to proceed with treatment. We will proceed with treatment per MD orders.   Patient tolerated treatment well with no complaints voiced.  Patient left ambulatory in stable condition.  Vital signs stable at discharge.  Follow up as scheduled.

## 2024-01-24 NOTE — Patient Instructions (Signed)
 [facility] [place] - A DEPT OF [facility]  Discharge Instructions: Thank you for choosing [facility] to provide your oncology and hematology care.  If you have a lab appointment with the Cancer Center - please note that after April 8th, 2024, all labs will be drawn in the cancer center.  You do not have to check in or register with the main entrance as you have in the past but will complete your check-in in the cancer center.  Wear comfortable clothing and clothing appropriate for easy access to any Portacath or PICC line.   We strive to give you quality time with your provider. You may need to reschedule your appointment if you arrive late (15 or more minutes).  Arriving late affects you and other patients whose appointments are after yours.  Also, if you miss three or more appointments without notifying the office, you may be dismissed from the clinic at the provider's discretion.      For prescription refill requests, have your pharmacy contact our office and allow 72 hours for refills to be completed.    Today you received the following chemotherapy and/or immunotherapy agents Vegzelma.  Bevacizumab Injection What is this medication? BEVACIZUMAB (be va SIZ yoo mab) treats some types of cancer. It works by blocking a protein that causes cancer cells to grow and multiply. This helps to slow or stop the spread of cancer cells. It is a monoclonal antibody. This medicine may be used for other purposes; ask your health care provider or pharmacist if you have questions. COMMON BRAND NAME(S): Alymsys, Avastin, MVASI, Rosaland Lao What should I tell my care team before I take this medication? They need to know if you have any of these conditions: Blood clots Coughing up blood Having or recent surgery Heart failure High blood pressure History of a connection between 2 or more body parts that do not usually connect (fistula) History of a tear in your  stomach or intestines Protein in your urine An unusual or allergic reaction to bevacizumab, other medications, foods, dyes, or preservatives Pregnant or trying to get pregnant Breast-feeding How should I use this medication? This medication is injected into a vein. It is given by your care team in a hospital or clinic setting. Talk to your care team the use of this medication in children. Special care may be needed. Overdosage: If you think you have taken too much of this medicine contact a poison control center or emergency room at once. NOTE: This medicine is only for you. Do not share this medicine with others. What if I miss a dose? Keep appointments for follow-up doses. It is important not to miss your dose. Call your care team if you are unable to keep an appointment. What may interact with this medication? Interactions are not expected. This list may not describe all possible interactions. Give your health care provider a list of all the medicines, herbs, non-prescription drugs, or dietary supplements you use. Also tell them if you smoke, drink alcohol, or use illegal drugs. Some items may interact with your medicine. What should I watch for while using this medication? Your condition will be monitored carefully while you are receiving this medication. You may need blood work while taking this medication. This medication may make you feel generally unwell. This is not uncommon as chemotherapy can affect healthy cells as well as cancer cells. Report any side effects. Continue your course of treatment even though you feel  ill unless your care team tells you to stop. This medication may increase your risk to bruise or bleed. Call your care team if you notice any unusual bleeding. Before having surgery, talk to your care team to make sure it is ok. This medication can increase the risk of poor healing of your surgical site or wound. You will need to stop this medication for 28 days before  surgery. After surgery, wait at least 28 days before restarting this medication. Make sure the surgical site or wound is healed enough before restarting this medication. Talk to your care team if questions. Talk to your care team if you may be pregnant. Serious birth defects can occur if you take this medication during pregnancy and for 6 months after the last dose. Contraception is recommended while taking this medication and for 6 months after the last dose. Your care team can help you find the option that works for you. Do not breastfeed while taking this medication and for 6 months after the last dose. This medication can cause infertility. Talk to your care team if you are concerned about your fertility. What side effects may I notice from receiving this medication? Side effects that you should report to your care team as soon as possible: Allergic reactions--skin rash, itching, hives, swelling of the face, lips, tongue, or throat Bleeding--bloody or black, tar-like stools, vomiting blood or Kadyn Chovan material that looks like coffee grounds, red or dark Sylvain Hasten urine, small red or purple spots on skin, unusual bruising or bleeding Blood clot--pain, swelling, or warmth in the leg, shortness of breath, chest pain Heart attack--pain or tightness in the chest, shoulders, arms, or jaw, nausea, shortness of breath, cold or clammy skin, feeling faint or lightheaded Heart failure--shortness of breath, swelling of the ankles, feet, or hands, sudden weight gain, unusual weakness or fatigue Increase in blood pressure Infection--fever, chills, cough, sore throat, wounds that don't heal, pain or trouble when passing urine, general feeling of discomfort or being unwell Infusion reactions--chest pain, shortness of breath or trouble breathing, feeling faint or lightheaded Kidney injury--decrease in the amount of urine, swelling of the ankles, hands, or feet Stomach pain that is severe, does not go away, or gets  worse Stroke--sudden numbness or weakness of the face, arm, or leg, trouble speaking, confusion, trouble walking, loss of balance or coordination, dizziness, severe headache, change in vision Sudden and severe headache, confusion, change in vision, seizures, which may be signs of posterior reversible encephalopathy syndrome (PRES) Side effects that usually do not require medical attention (report to your care team if they continue or are bothersome): Back pain Change in taste Diarrhea Dry skin Increased tears Nosebleed This list may not describe all possible side effects. Call your doctor for medical advice about side effects. You may report side effects to FDA at 1-800-FDA-1088. Where should I keep my medication? This medication is given in a hospital or clinic. It will not be stored at home. NOTE: This sheet is a summary. It may not cover all possible information. If you have questions about this medicine, talk to your doctor, pharmacist, or health care provider.  2024 Elsevier/Gold Standard (2021-06-11 00:00:00)       To help prevent nausea and vomiting after your treatment, we encourage you to take your nausea medication as directed.  BELOW ARE SYMPTOMS THAT SHOULD BE REPORTED IMMEDIATELY: *FEVER GREATER THAN 100.4 F (38 C) OR HIGHER *CHILLS OR SWEATING *NAUSEA AND VOMITING THAT IS NOT CONTROLLED WITH YOUR NAUSEA MEDICATION *UNUSUAL SHORTNESS  OF BREATH *UNUSUAL BRUISING OR BLEEDING *URINARY PROBLEMS (pain or burning when urinating, or frequent urination) *BOWEL PROBLEMS (unusual diarrhea, constipation, pain near the anus) TENDERNESS IN MOUTH AND THROAT WITH OR WITHOUT PRESENCE OF ULCERS (sore throat, sores in mouth, or a toothache) UNUSUAL RASH, SWELLING OR PAIN  UNUSUAL VAGINAL DISCHARGE OR ITCHING   Items with * indicate a potential emergency and should be followed up as soon as possible or go to the Emergency Department if any problems should occur.  Please show the  CHEMOTHERAPY ALERT CARD or IMMUNOTHERAPY ALERT CARD at check-in to the Emergency Department and triage nurse.  Should you have questions after your visit or need to cancel or reschedule your appointment, please contact [facility] [place] - A DEPT OF Eligha Bridegroom [facility] [phone]  and follow the prompts.  Office hours are 8:00 a.m. to 4:30 p.m. Monday - Friday. Please note that voicemails left after 4:00 p.m. may not be returned until the following business day.  We are closed weekends and major holidays. You have access to a nurse at all times for urgent questions. Please call the main number to the clinic [phone] and follow the prompts.  For any non-urgent questions, you may also contact your provider using MyChart. We now offer e-Visits for anyone 92 and older to request care online for non-urgent symptoms. For details visit [email or web address].   Also download the MyChart app! Go to the app store, search "MyChart", open the app, select , and log in with your MyChart username and password.

## 2024-01-24 NOTE — Progress Notes (Signed)
 SPIRITUAL CARE AND COUNSELING CONSULT NOTE   VISIT SUMMARY   Reason for Visit: Chaplain making scheduled follow-up with Pt I previously connected with.  Description of Visit: I entered the room and found Diana Fox sitting in the chair receiving her treatment.  She appears well and happy, and is dressed very fashionably.   She states that she is still in classes to obtain her degree, but is currently out for the semester until classes begin again in the new year.  She reports A's and B's for grades.   Pt describes loving and supportive family and she maintains the role of matriarch to her daughters and grandchildren, cooking for them and hosting celebrations.  She takes great pride in being able to work, go to school, receive treatment AND still care for and support her family.   Diana Fox also spoke of her time with Dr Davonna and how pleased she is that the Dr noticed her confidence and lack of complaining.  She gives the credit to her faith.  Plan of Care: I will continue to follow Diana Fox on a Monthly basis.   SPIRITUAL ENCOUNTER                                                                                                                                                                      Type of Visit: Follow up Care provided to:: Patient Referral source: Chaplain assessment Reason for visit: Routine spiritual support OnCall Visit: No   SPIRITUAL FRAMEWORK  Presenting Themes: Meaning/purpose/sources of inspiration, Goals in life/care, Values and beliefs, Coping tools, Courage hope and growth Values/beliefs: Pt professes and displays a strong Christian faith Community/Connection: Family, Faith community Strengths: Spirituality; Perseverance; Love Needs/Challenges/Barriers: Currently working, attending school to get her degree, and reciving treatment Patient Stress Factors: None identified Family Stress Factors: None identified   GOALS   Self/Personal Goals: Finish her degree and  advance career Clinical Care Goals: Continue to support Pt in treatment   INTERVENTIONS   Spiritual Care Interventions Made: Reflective listening, Narrative/life review, Explored values/beliefs/practices/strengths, Meaning making, Encouragement    INTERVENTION OUTCOMES   Outcomes: Connection to spiritual care, Awareness of support  SPIRITUAL CARE PLAN   Spiritual Care Issues Still Outstanding: Chaplain will continue to follow   Maude Roll, MDiv Chaplain, [facility] [email or web address] [phone] 01/24/2024 11:07 AM

## 2024-01-24 NOTE — Progress Notes (Signed)
 Patient has been examined by Dr. Davonna. Vital signs and labs have been reviewed by MD - ANC (0.7), Creatinine, LFTs, hemoglobin (7.4), and platelets have been reviewed by M.D. - pt may proceed with treatment.  Primary RN and pharmacy notified.

## 2024-01-24 NOTE — Progress Notes (Signed)
 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 S/p first-line treatment with FOLFOXIRI and panitumumab  progression Second line-FOLFOX plus panitumumab  Third line-Lonsurf  plus bevacizumab    - Patient tolerating Lonsurf  and bevacizumab  well.  She reports no complaints from it today - Scheduled for cycle 8-day15 today.  Physical exam and labs stable.  Will proceed with chemotherapy today. - Repeat CT scan in 3 months.  Ordered prior to next visit. - CEA stable at this time, will keep a close eye.  Will repeat with every cycle. - Labs reviewed today: CMP: Normal creatinine, normal potassium and magnesium , normal LFTs.  CBC: WBC: 2.2, hemoglobin: 7.4, platelets: 183.  UA with no significant proteinuria  - Continue Lonsurf  60 mg 2 times daily on days 1-5, 8-12 of a 28-day cycle  Return to clinic in 4 weeks for follow up and CT review  Chemotherapy-induced anemia Anemia secondary to Lonsurf  with hemoglobin at 7.4 g/dL. Asymptomatic, iron  studies normal, no transfusion indicated.  - Advised discontinuation of iron  supplementation. - Monitored hemoglobin; no transfusion indicated.  Chemotherapy-induced neutropenia Neutropenia due to Lonsurf , asymptomatic with no infectious symptoms. - Monitored white blood cell count; no intervention required.  Colostomy status She inquired about colonoscopy for potential colostomy  reversal.   - Consider patient has CEA that has been uptrending recently that he will have to hold Avastin  for both colonoscopy and colostomy reversal, we will delay this at this time. - Reversal of colostomy is usually not indicated in metastatic colon cancer but patient is very motivated to do this.    The patient understands the plans discussed today and is in agreement with them.  She knows to contact our office if she develops concerns prior to her next appointment.  The total time spent in the appointment was 17 minutes for the encounter  with patient, including review of chart and various tests results, discussions about plan of care and coordination of care plan     Mickiel Dry, MD  [facility] [place] - A DEPT OF [facility] [address] [place] [address] Dept: [phone] Dept Fax: [phone]   Orders Placed This Encounter  Procedures   CT CHEST ABDOMEN PELVIS W CONTRAST    Standing Status:   Future    Expected Date:   02/24/2024    Expiration Date:   01/23/2025    If indicated for the ordered procedure, I authorize the administration of contrast media per Radiology protocol:   Yes    Does the patient have a contrast media/X-ray dye allergy?:   No    Preferred imaging location?:   [address] [facility]    If indicated for the ordered procedure, I authorize the administration of oral contrast media per Radiology protocol:   Yes     ONCOLOGY HISTORY:   I have reviewed her chart and materials related to her cancer extensively and collaborated history with the patient. 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 There are bilateral mildly enlarged common iliac chain nodes associated with this. There are mildly dilated mid to lower abdominal small bowel segments up to 3.2 cm without visible transition and decompressed but occasionally thickened small bowel segments in the right lower abdomen. -03/02/2021: Colonoscopy found: Nonbleeding internal hemorrhoids, severe stenosis in the sigmoid colon at approximately 18 cm from anal verge.  Pathology of sigmoid colon: Adenocarcinoma, moderately to poorly differentiated.  -03/24/2021: CEA 61.5.  -03/26/2021: Sigmoid colon resection, partial small bowel resection and Hartman's procedure. Tumor extended outside the colon to the pelvic brim and the surgeon was unable to fully excise all of the tumor in the pelvis.  Pathology: Colonic adenocarcinoma, moderately to poorly differentiated, involving the subserosal adipose tissue and present at the margin. Small bowel resection positive for adenocarcinoma nodules in the serosal fat. 9 out of 9 lymph nodes positive for metastatic carcinoma (9/9). MMR preserved. pT4, pN2b  -03/29/2021: NGS: KRAS/NRAS negative, BRAF negative, HER2 IHC (0) negative, MSI-stable, PDL1 : 0, Negative, POLE: Negative, PTEN: IHC: positive 1+, 100%. -04/29/2021: PET: Multifocal hypermetabolic nodal, peritoneal and anterior abdominal wall hypermetabolic activity. Based on the presence of metastatic disease within lymph nodes and along the serosal surface of the small bowel at time of the patient's prior surgery, these findings are worrisome for progressive nodal metastatic disease and peritoneal carcinomatosis. No evidence of hepatic metastatic disease or other distant metastases. -05/17/2021-11/2021: FOLFOXIRI and Panitumumab , oxaliplatin  discontinued after 10 cycles -11/2021-12/29/2022: FOLFIRI and Panitumumab , discontinued due to  progression -01/10/2023: CT CAP: Interval enlargement of a heterogeneous mixed solid and cystic mass arising from the right adnexa, consistent with worsened ovarian metastatic disease. Interval enlargement of a soft tissue mass in the sigmoid mesentery consistent with worsened mesenteric soft tissue or nodal metastatic disease. -10/04/2021: Colonoscopy negative for malignancy.  -01/18/2023-05/31/2023: FOLFOX and bevacizumab , discontinued due to progression -06/07/2023: CT CAP: Interval enlargement of the complex mixed cystic and solid mass arising from the right adnexa. Interval enlargement of the soft tissue mass/lymph node in the sigmoid mesentery along the left common iliac artery. -06/16/2023: [employer]: GNAS, T p53, APC, MSI high not detected, TMB-low.  -06/19/2023-current: Lonsurf  and bevacizumab   -09/05/2023: CT CAP: Stable soft tissue nodularity in the sigmoid mesocolon. Slight improvement in peritoneal nodularity along the LEFT pericolic gutter.  No significant change in large complex cystic mass in the RIGHT lower quadrant presumably originating from the RIGHT ovary. -09/11/2023: CEA: 306 - 10/26/2023: CT CAP: No significant change in a bulky multicystic mass arising from the right ovary, measuring 14.4 x 11.9 cm. Unchanged metastatic soft tissue mass or lymph node partially encasing the inferior mesenteric artery in the sigmoid mesocolon measuring 1.9 x 1.8 cm. Unchanged tiny pulmonary nodules, nonspecific although most likely benign and incidental.   Current Treatment:  Lonsurf  and bevacizumab    INTERVAL HISTORY:   Discussed the use of AI scribe software for clinical note transcription with the patient, who gave verbal consent to proceed.  History of Present Illness Diana Fox is a 53 year old female with metastatic colon cancer on Lonsurf  and avastin  who presents for routine follow-up.  She is  currently receiving Lonsurf  and reports no new symptoms since her last visit. She  denies diarrhea, fatigue, or dyspnea.  She has been informed that her hemoglobin and white blood cell count are low. She has been taking over-the-counter iron  supplements, though her iron  levels are within normal limits and she perceives no benefit. She denies symptoms of anemia, including fatigue or dyspnea, and is able to maintain her daily activities, including attending nursing school, despite occasional feelings of being overwhelmed.  She reports intermittent spotting, which she attributes to Depo-Provera, and denies dysuria or other urinary symptoms. Recent urinalysis showed microscopic hematuria, but she reports no associated symptoms. She denies pain and maintains a positive outlook, stating she does not report symptoms unless they are significant.    I have reviewed the past medical history, past surgical history, social history and family history with the patient and they are unchanged from previous note.  ALLERGIES:  is allergic to zithromax  [azithromycin ], motrin [ibuprofen], sudafed [pseudoephedrine  hcl], and zestril [lisinopril].  MEDICATIONS:  Current Outpatient Medications  Medication Sig Dispense Refill   amLODipine  (NORVASC ) 10 MG tablet Take 1 tablet (10 mg total) by mouth daily. 90 tablet 1   cetirizine (ZYRTEC) 10 MG tablet SMARTSIG:1 Tablet(s) By Mouth Every Evening     fexofenadine  (ALLEGRA ) 180 MG tablet Take 1 tablet (180 mg total) by mouth daily. 30 tablet 0   fluticasone  (FLONASE ) 50 MCG/ACT nasal spray Place 2 sprays into both nostrils daily. 16 g 12   lidocaine  4 % Place 1 patch onto the skin daily. 30 patch 0   magnesium  oxide (MAG-OX) 400 (240 Mg) MG tablet Take 1 tablet (400 mg total) by mouth 2 (two) times daily. 60 tablet 6   medroxyPROGESTERone (DEPO-PROVERA) 150 MG/ML injection Inject 150 mg into the muscle every 3 (three) months.     Multiple Vitamin (MULTIVITAMIN) tablet Take 1 tablet by mouth daily.     nebivolol  (BYSTOLIC ) 10 MG tablet Take 10 mg by  mouth daily.     pantoprazole  (PROTONIX ) 40 MG tablet Take 40 mg by mouth daily as needed (acid reflux).     potassium chloride  SA (KLOR-CON  M20) 20 MEQ tablet Take 2 tablets (40 mEq total) by mouth 2 (two) times daily. 360 tablet 3   prochlorperazine  (COMPAZINE ) 10 MG tablet Take 1 tablet (10 mg total) by mouth every 6 (six) hours as needed for nausea or vomiting. 60 tablet 2   trifluridine -tipiracil  (LONSURF ) 20-8.19 MG tablet Take 3 tablets (60 mg of trifluridine  total) by mouth 2 (two) times daily after a meal. Take within 1 hr after AM & PM meals on days 1-5, 8-12. Repeat every 28 days. 60 tablet 2   No current facility-administered medications for this visit.   Facility-Administered Medications Ordered in Other Visits  Medication Dose Route Frequency Provider Last Rate Last Admin   0.9 %  sodium chloride  infusion   Intravenous Continuous Domanick Cuccia, MD   Stopped at 01/24/24 1114     VITALS:  Height 5' 5 (1.651 m), weight 159 lb (72.1 kg).  Wt Readings from Last 3 Encounters:  01/24/24 159 lb (72.1 kg)  01/10/24 161 lb (73 kg)  12/20/23 165 lb 9.6 oz (75.1 kg)    Body mass index is 26.46 kg/m.  Performance status (ECOG): 0 - Asymptomatic  PHYSICAL EXAM:   GENERAL:alert, no distress and comfortable SKIN: skin color, texture, turgor are normal, no rashes or significant lesions LYMPH:  no palpable lymphadenopathy in the cervical, axillary or inguinal LUNGS: clear  to auscultation and percussion with normal breathing effort HEART: regular rate & rhythm and no murmurs and no lower extremity edema ABDOMEN:abdomen soft, non-tender and normal bowel sounds, left colostomy bag present Musculoskeletal:no cyanosis of digits and no clubbing  NEURO: alert & oriented x 3 with fluent speech  LABORATORY DATA:  I have reviewed the data as listed  Lab Results  Component Value Date   WBC 2.2 (L) 01/24/2024   NEUTROABS 0.7 (L) 01/24/2024   HGB 7.4 (L) 01/24/2024   HCT 22.4 (L)  01/24/2024   MCV 106.2 (H) 01/24/2024   PLT 183 01/24/2024     Chemistry      Component Value Date/Time   NA 140 01/24/2024 0829   K 3.8 01/24/2024 0829   CL 110 01/24/2024 0829   CO2 18 (L) 01/24/2024 0829   BUN 6 01/24/2024 0829   CREATININE 0.72 01/24/2024 0829      Component Value Date/Time   CALCIUM  8.6 (L) 01/24/2024 0829   ALKPHOS 49 01/24/2024 0829   AST 18 01/24/2024 0829   ALT 6 01/24/2024 0829   BILITOT 0.6 01/24/2024 0829       Latest Reference Range & Units 10/10/23 09:28  Iron  28 - 170 ug/dL 83  UIBC ug/dL 828  TIBC 749 - 549 ug/dL 745  Saturation Ratios 10.4 - 31.8 % 33 (H)  Ferritin 11 - 307 ng/mL 176  Folate >5.9 ng/mL 16.2  Vitamin B12 180 - 914 pg/mL 543  (H): Data is abnormally high   Latest Reference Range & Units 12/05/23 08:16  CEA 0.0 - 4.7 ng/mL 265.0 (H)  (H): Data is abnormally high   Latest Reference Range & Units 01/10/24 08:46  Appearance CLEAR  CLEAR  Bilirubin Urine NEGATIVE  NEGATIVE  Color, Urine YELLOW  YELLOW  Glucose, UA NEGATIVE mg/dL NEGATIVE  Hgb urine dipstick NEGATIVE  LARGE !  Ketones, ur NEGATIVE mg/dL NEGATIVE  Leukocytes,Ua NEGATIVE  NEGATIVE  Nitrite NEGATIVE  NEGATIVE  pH 5.0 - 8.0  5.0  Protein NEGATIVE mg/dL NEGATIVE  Specific Gravity, Urine 1.005 - 1.030  1.009  !: Data is abnormal  RADIOGRAPHIC STUDIES: I have personally reviewed the radiological images as listed and agreed with the findings in the report.

## 2024-01-24 NOTE — Patient Instructions (Signed)

## 2024-01-25 ENCOUNTER — Other Ambulatory Visit: Payer: Self-pay

## 2024-01-31 ENCOUNTER — Inpatient Hospital Stay

## 2024-01-31 ENCOUNTER — Inpatient Hospital Stay: Admitting: Oncology

## 2024-02-05 ENCOUNTER — Encounter: Payer: Self-pay | Admitting: *Deleted

## 2024-02-06 ENCOUNTER — Other Ambulatory Visit: Payer: Self-pay

## 2024-02-07 ENCOUNTER — Inpatient Hospital Stay

## 2024-02-07 VITALS — BP 142/81 | HR 77 | Temp 97.2°F | Resp 18 | Wt 160.7 lb

## 2024-02-07 VITALS — BP 137/76 | HR 82 | Temp 98.2°F | Resp 18

## 2024-02-07 DIAGNOSIS — C189 Malignant neoplasm of colon, unspecified: Secondary | ICD-10-CM

## 2024-02-07 DIAGNOSIS — Z95828 Presence of other vascular implants and grafts: Secondary | ICD-10-CM

## 2024-02-07 DIAGNOSIS — Z5112 Encounter for antineoplastic immunotherapy: Secondary | ICD-10-CM | POA: Diagnosis not present

## 2024-02-07 LAB — CBC WITH DIFFERENTIAL/PLATELET
Abs Immature Granulocytes: 0.02 K/uL (ref 0.00–0.07)
Basophils Absolute: 0 K/uL (ref 0.0–0.1)
Basophils Relative: 0 %
Eosinophils Absolute: 0 K/uL (ref 0.0–0.5)
Eosinophils Relative: 0 %
HCT: 24.7 % — ABNORMAL LOW (ref 36.0–46.0)
Hemoglobin: 8.3 g/dL — ABNORMAL LOW (ref 12.0–15.0)
Immature Granulocytes: 1 %
Lymphocytes Relative: 39 %
Lymphs Abs: 1.2 K/uL (ref 0.7–4.0)
MCH: 34.9 pg — ABNORMAL HIGH (ref 26.0–34.0)
MCHC: 33.6 g/dL (ref 30.0–36.0)
MCV: 103.8 fL — ABNORMAL HIGH (ref 80.0–100.0)
Monocytes Absolute: 0.1 K/uL (ref 0.1–1.0)
Monocytes Relative: 4 %
Neutro Abs: 1.7 K/uL (ref 1.7–7.7)
Neutrophils Relative %: 56 %
Platelets: 152 K/uL (ref 150–400)
RBC: 2.38 MIL/uL — ABNORMAL LOW (ref 3.87–5.11)
RDW: 16.1 % — ABNORMAL HIGH (ref 11.5–15.5)
WBC: 3.1 K/uL — ABNORMAL LOW (ref 4.0–10.5)
nRBC: 0 % (ref 0.0–0.2)

## 2024-02-07 LAB — COMPREHENSIVE METABOLIC PANEL WITH GFR
ALT: 6 U/L (ref 0–44)
AST: 20 U/L (ref 15–41)
Albumin: 4 g/dL (ref 3.5–5.0)
Alkaline Phosphatase: 54 U/L (ref 38–126)
Anion gap: 9 (ref 5–15)
BUN: 10 mg/dL (ref 6–20)
CO2: 24 mmol/L (ref 22–32)
Calcium: 8.6 mg/dL — ABNORMAL LOW (ref 8.9–10.3)
Chloride: 108 mmol/L (ref 98–111)
Creatinine, Ser: 0.87 mg/dL (ref 0.44–1.00)
GFR, Estimated: >60 mL/min
Glucose, Bld: 92 mg/dL (ref 70–99)
Potassium: 3.5 mmol/L (ref 3.5–5.1)
Sodium: 140 mmol/L (ref 135–145)
Total Bilirubin: 0.5 mg/dL (ref 0.0–1.2)
Total Protein: 7 g/dL (ref 6.5–8.1)

## 2024-02-07 LAB — MAGNESIUM: Magnesium: 1.8 mg/dL (ref 1.7–2.4)

## 2024-02-07 LAB — URINALYSIS, DIPSTICK ONLY
Bilirubin Urine: NEGATIVE
Glucose, UA: NEGATIVE mg/dL
Hgb urine dipstick: NEGATIVE
Ketones, ur: NEGATIVE mg/dL
Leukocytes,Ua: NEGATIVE
Nitrite: NEGATIVE
Protein, ur: NEGATIVE mg/dL
Specific Gravity, Urine: 1.008 (ref 1.005–1.030)
pH: 6 (ref 5.0–8.0)

## 2024-02-07 LAB — PREGNANCY, URINE: Preg Test, Ur: NEGATIVE

## 2024-02-07 MED ORDER — SODIUM CHLORIDE 0.9 % IV SOLN
5.0000 mg/kg | Freq: Once | INTRAVENOUS | Status: AC
  Administered 2024-02-07: 400 mg via INTRAVENOUS
  Filled 2024-02-07: qty 16

## 2024-02-07 MED ORDER — SODIUM CHLORIDE 0.9 % IV SOLN: INTRAVENOUS | Status: DC

## 2024-02-07 NOTE — Patient Instructions (Signed)
 [facility] [place] - A DEPT OF [facility]  Discharge Instructions: Thank you for choosing [facility] to provide your oncology and hematology care.  If you have a lab appointment with the Cancer Center - please note that after April 8th, 2024, all labs will be drawn in the cancer center.  You do not have to check in or register with the main entrance as you have in the past but will complete your check-in in the cancer center.  Wear comfortable clothing and clothing appropriate for easy access to any Portacath or PICC line.   We strive to give you quality time with your provider. You may need to reschedule your appointment if you arrive late (15 or more minutes).  Arriving late affects you and other patients whose appointments are after yours.  Also, if you miss three or more appointments without notifying the office, you may be dismissed from the clinic at the provider's discretion.      For prescription refill requests, have your pharmacy contact our office and allow 72 hours for refills to be completed.    Today you received the following chemotherapy and/or immunotherapy agents Vegzelma.  Bevacizumab Injection What is this medication? BEVACIZUMAB (be va SIZ yoo mab) treats some types of cancer. It works by blocking a protein that causes cancer cells to grow and multiply. This helps to slow or stop the spread of cancer cells. It is a monoclonal antibody. This medicine may be used for other purposes; ask your health care provider or pharmacist if you have questions. COMMON BRAND NAME(S): Alymsys, Avastin, MVASI, Rosaland Lao What should I tell my care team before I take this medication? They need to know if you have any of these conditions: Blood clots Coughing up blood Having or recent surgery Heart failure High blood pressure History of a connection between 2 or more body parts that do not usually connect (fistula) History of a tear in your  stomach or intestines Protein in your urine An unusual or allergic reaction to bevacizumab, other medications, foods, dyes, or preservatives Pregnant or trying to get pregnant Breast-feeding How should I use this medication? This medication is injected into a vein. It is given by your care team in a hospital or clinic setting. Talk to your care team the use of this medication in children. Special care may be needed. Overdosage: If you think you have taken too much of this medicine contact a poison control center or emergency room at once. NOTE: This medicine is only for you. Do not share this medicine with others. What if I miss a dose? Keep appointments for follow-up doses. It is important not to miss your dose. Call your care team if you are unable to keep an appointment. What may interact with this medication? Interactions are not expected. This list may not describe all possible interactions. Give your health care provider a list of all the medicines, herbs, non-prescription drugs, or dietary supplements you use. Also tell them if you smoke, drink alcohol, or use illegal drugs. Some items may interact with your medicine. What should I watch for while using this medication? Your condition will be monitored carefully while you are receiving this medication. You may need blood work while taking this medication. This medication may make you feel generally unwell. This is not uncommon as chemotherapy can affect healthy cells as well as cancer cells. Report any side effects. Continue your course of treatment even though you feel  ill unless your care team tells you to stop. This medication may increase your risk to bruise or bleed. Call your care team if you notice any unusual bleeding. Before having surgery, talk to your care team to make sure it is ok. This medication can increase the risk of poor healing of your surgical site or wound. You will need to stop this medication for 28 days before  surgery. After surgery, wait at least 28 days before restarting this medication. Make sure the surgical site or wound is healed enough before restarting this medication. Talk to your care team if questions. Talk to your care team if you may be pregnant. Serious birth defects can occur if you take this medication during pregnancy and for 6 months after the last dose. Contraception is recommended while taking this medication and for 6 months after the last dose. Your care team can help you find the option that works for you. Do not breastfeed while taking this medication and for 6 months after the last dose. This medication can cause infertility. Talk to your care team if you are concerned about your fertility. What side effects may I notice from receiving this medication? Side effects that you should report to your care team as soon as possible: Allergic reactions--skin rash, itching, hives, swelling of the face, lips, tongue, or throat Bleeding--bloody or black, tar-like stools, vomiting blood or Kadyn Chovan material that looks like coffee grounds, red or dark Sylvain Hasten urine, small red or purple spots on skin, unusual bruising or bleeding Blood clot--pain, swelling, or warmth in the leg, shortness of breath, chest pain Heart attack--pain or tightness in the chest, shoulders, arms, or jaw, nausea, shortness of breath, cold or clammy skin, feeling faint or lightheaded Heart failure--shortness of breath, swelling of the ankles, feet, or hands, sudden weight gain, unusual weakness or fatigue Increase in blood pressure Infection--fever, chills, cough, sore throat, wounds that don't heal, pain or trouble when passing urine, general feeling of discomfort or being unwell Infusion reactions--chest pain, shortness of breath or trouble breathing, feeling faint or lightheaded Kidney injury--decrease in the amount of urine, swelling of the ankles, hands, or feet Stomach pain that is severe, does not go away, or gets  worse Stroke--sudden numbness or weakness of the face, arm, or leg, trouble speaking, confusion, trouble walking, loss of balance or coordination, dizziness, severe headache, change in vision Sudden and severe headache, confusion, change in vision, seizures, which may be signs of posterior reversible encephalopathy syndrome (PRES) Side effects that usually do not require medical attention (report to your care team if they continue or are bothersome): Back pain Change in taste Diarrhea Dry skin Increased tears Nosebleed This list may not describe all possible side effects. Call your doctor for medical advice about side effects. You may report side effects to FDA at 1-800-FDA-1088. Where should I keep my medication? This medication is given in a hospital or clinic. It will not be stored at home. NOTE: This sheet is a summary. It may not cover all possible information. If you have questions about this medicine, talk to your doctor, pharmacist, or health care provider.  2024 Elsevier/Gold Standard (2021-06-11 00:00:00)       To help prevent nausea and vomiting after your treatment, we encourage you to take your nausea medication as directed.  BELOW ARE SYMPTOMS THAT SHOULD BE REPORTED IMMEDIATELY: *FEVER GREATER THAN 100.4 F (38 C) OR HIGHER *CHILLS OR SWEATING *NAUSEA AND VOMITING THAT IS NOT CONTROLLED WITH YOUR NAUSEA MEDICATION *UNUSUAL SHORTNESS  OF BREATH *UNUSUAL BRUISING OR BLEEDING *URINARY PROBLEMS (pain or burning when urinating, or frequent urination) *BOWEL PROBLEMS (unusual diarrhea, constipation, pain near the anus) TENDERNESS IN MOUTH AND THROAT WITH OR WITHOUT PRESENCE OF ULCERS (sore throat, sores in mouth, or a toothache) UNUSUAL RASH, SWELLING OR PAIN  UNUSUAL VAGINAL DISCHARGE OR ITCHING   Items with * indicate a potential emergency and should be followed up as soon as possible or go to the Emergency Department if any problems should occur.  Please show the  CHEMOTHERAPY ALERT CARD or IMMUNOTHERAPY ALERT CARD at check-in to the Emergency Department and triage nurse.  Should you have questions after your visit or need to cancel or reschedule your appointment, please contact [facility] [place] - A DEPT OF Eligha Bridegroom [facility] [phone]  and follow the prompts.  Office hours are 8:00 a.m. to 4:30 p.m. Monday - Friday. Please note that voicemails left after 4:00 p.m. may not be returned until the following business day.  We are closed weekends and major holidays. You have access to a nurse at all times for urgent questions. Please call the main number to the clinic [phone] and follow the prompts.  For any non-urgent questions, you may also contact your provider using MyChart. We now offer e-Visits for anyone 92 and older to request care online for non-urgent symptoms. For details visit [email or web address].   Also download the MyChart app! Go to the app store, search "MyChart", open the app, select , and log in with your MyChart username and password.

## 2024-02-07 NOTE — Progress Notes (Signed)
 Patient presents today for chemotherapy/immunotherapy infusion of Vegzelma .  Patient is in satisfactory condition with no new complaints voiced.  Vital signs are stable.  Labs reviewed and all labs are within treatment parameters.  We will proceed with treatment per MD orders.    Patient tolerated treatment well with no complaints voiced.  Patient left ambulatory in stable condition.  Vital signs stable at discharge.  Follow up as scheduled.

## 2024-02-08 LAB — CEA: CEA: 272 ng/mL — ABNORMAL HIGH (ref 0.0–4.7)

## 2024-02-12 ENCOUNTER — Ambulatory Visit (HOSPITAL_COMMUNITY)

## 2024-02-12 ENCOUNTER — Telehealth (HOSPITAL_COMMUNITY): Payer: Self-pay

## 2024-02-12 NOTE — Therapy (Deleted)
 " OUTPATIENT PHYSICAL THERAPY THORACOLUMBAR EVALUATION   Patient Name: Diana Fox MRN: [id number] DOB:1970-04-21, 54 y.o., female Today's Date: 02/12/2024  END OF SESSION:   Past Medical History:  Diagnosis Date   Anemia    Back pain    Hypertension    Port-A-Cath in place 05/10/2021   Past Surgical History:  Procedure Laterality Date   BIOPSY  03/02/2021   Procedure: BIOPSY;  Surgeon: Cindie Carlin POUR, DO;  [facility];  Service: Endoscopy;;   BOWEL RESECTION N/A 03/26/2021   Procedure: SMALL BOWEL RESECTION;  Surgeon: Mavis Anes, MD;  Location: [facility];  Service: General;  Laterality: N/A;   CHOLECYSTECTOMY     COLONOSCOPY WITH PROPOFOL  N/A 10/04/2021   Procedure: COLONOSCOPY WITH PROPOFOL ;  Surgeon: Cindie Carlin POUR, DO;  [facility];  Service: Endoscopy;  Laterality: N/A;  12:15PM, VIA OSTOMY   COLOSTOMY N/A 03/26/2021   Procedure: COLOSTOMY;  Surgeon: Mavis Anes, MD;  Location: [facility];  Service: General;  Laterality: N/A;   ESOPHAGOGASTRODUODENOSCOPY (EGD) WITH PROPOFOL  N/A 03/02/2021   Procedure: ESOPHAGOGASTRODUODENOSCOPY (EGD) WITH PROPOFOL ;  Surgeon: Cindie Carlin POUR, DO;  [facility];  Service: Endoscopy;  Laterality: N/A;   PARTIAL COLECTOMY N/A 03/26/2021   Procedure: PARTIAL COLECTOMY;  Surgeon: Mavis Anes, MD;  Location: [facility];  Service: General;  Laterality: N/A;   PORTACATH PLACEMENT Left 04/14/2021   Procedure: INSERTION PORT-A-CATH;  Surgeon: Mavis Anes, MD;  Location: [facility];  Service: General;  Laterality: Left;   SCLEROTHERAPY  03/02/2021   Procedure: MATIAS;  Surgeon: Cindie Carlin POUR, DO;  [facility];  Service: Endoscopy;;   SIGMOIDOSCOPY  03/02/2021   Procedure: KINGSTON;  Surgeon: Cindie Carlin POUR, DO;  [facility];  Service: Endoscopy;;   Patient Active Problem List   Diagnosis Date Noted   Macrocytic anemia 10/10/2023   Peripheral neuropathy 10/10/2023   Drug-induced  neutropenia 06/01/2021   Port-A-Cath in place 05/10/2021   Colon cancer metastasized to pelvis [facility])    S/P partial colectomy 03/26/2021   Colon carcinoma (HCC)    History of colitis 02/04/2021   Hypokalemia 01/13/2021   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  ONSET DATE: ***  SUBJECTIVE:  SUBJECTIVE STATEMENT: ***  PERTINENT HISTORY:  History of Present Illness: Diana Fox is a 54 y.o. female who has been referred by Comer Raymond, PA-C for evaluation of low back pain.  She states that she injured her low back approximately 10 years ago while at work.  It has not improved since the initial injury.  She was previously evaluated by an orthopedic surgeon, and provided with medications.  She did not see improvement in her symptoms.  She has not worked with therapy.  No prior injections.  At the same time, she states that she has had pain in the right shoulder, primarily posterior, since this injury at work.  Her motion is okay.  No numbness or tingling.  She denies radiating pains into her legs or into the right arm.  No issues with bowel or bladder function  Colon carcinoma  PAIN:  Are you having pain? {OPRCPAIN:27236}  PRECAUTIONS: None  RED FLAGS: {PT Red Flags:29287}   WEIGHT BEARING RESTRICTIONS: No  FALLS:  Has patient fallen in last 6 months? {fallsyesno:27318}  LIVING  ENVIRONMENT: Lives with: {OPRC lives with:25569::lives with their family} Lives in: {Lives in:25570} Stairs: {opstairs:27293} Has following equipment at home: {Assistive devices:23999}  OCCUPATION: ***  PLOF: {PLOF:24004}  PATIENT GOALS: ***  NEXT MD VISIT: ***  OBJECTIVE:  Note: Objective measures were completed at Evaluation unless otherwise noted.  DIAGNOSTIC FINDINGS:  X-rays of the lumbar spine were obtained in clinic today.  No acute injuries noted.  No anterolisthesis.  Well-maintained disc height.  In the inferior lumbar spine, there are some small anterior based osteophytes.  No bony lesions.   Impression: Lumbar spine x-rays with mild degenerative changes  PATIENT SURVEYS:  Modified Oswestry:  MODIFIED OSWESTRY DISABILITY SCALE  Date: *** Score  Pain intensity {ODI 1:32962}  2. Personal care (washing, dressing, etc.) {ODI 2:32963}  3. Lifting {ODI 3:32964}  4. Walking {ODI 4:32965}  5. Sitting {ODI 5:32966}  6. Standing {ODI 6:32967}  7. Sleeping {ODI 7:32968}  8. Social Life {ODI 8:32969}  9. Traveling {ODI 9:32970}  10. Employment/ Homemaking {ODI 10:32971}  Total ***/50   Interpretation of scores: Score Category Description  0-20% Minimal Disability The patient can cope with most living activities. Usually no treatment is indicated apart from advice on lifting, sitting and exercise  21-40% Moderate Disability The patient experiences more pain and difficulty with sitting, lifting and standing. Travel and social life are more difficult and they may be disabled from work. Personal care, sexual activity and sleeping are not grossly affected, and the patient can usually be managed by conservative means  41-60% Severe Disability Pain remains the main problem in this group, but activities of daily living are affected. These patients require a detailed investigation  61-80% Crippled Back pain impinges on all aspects of the patients life. Positive intervention is  required  81-100% Bed-bound These patients are either bed-bound or exaggerating their symptoms  Bluford FORBES Zoe DELENA Karon DELENA, et al. Surgery versus conservative management of stable thoracolumbar fracture: the PRESTO feasibility RCT. [place] ([place]): [employer]; 2021 Nov. [facility] Technology Assessment, No. 25.62.) Appendix 3, Oswestry Disability Index category descriptors. Available from: Findjewelers.cz  Minimally Clinically Important Difference (MCID) = 12.8%  COGNITION: Overall cognitive status: {cognition:24006}     SENSATION: {sensation:27233}  MUSCLE LENGTH: Hamstrings: Right *** deg; Left *** deg Debby test: Right *** deg; Left *** deg  POSTURE: {posture:25561}  PALPATION: ***  LUMBAR ROM:   AROM eval  Flexion   Extension  Right lateral flexion   Left lateral flexion   Right rotation   Left rotation    (Blank rows = not tested)  LOWER EXTREMITY ROM:     {AROM/PROM:27142}  Right eval Left eval  Hip flexion    Hip extension    Hip abduction    Hip adduction    Hip internal rotation    Hip external rotation    Knee flexion    Knee extension    Ankle dorsiflexion    Ankle plantarflexion    Ankle inversion    Ankle eversion     (Blank rows = not tested)  LOWER EXTREMITY MMT:    MMT Right eval Left eval  Hip flexion    Hip extension    Hip abduction    Hip adduction    Hip internal rotation    Hip external rotation    Knee flexion    Knee extension    Ankle dorsiflexion    Ankle plantarflexion    Ankle inversion    Ankle eversion     (Blank rows = not tested)  LUMBAR SPECIAL TESTS:  {lumbar special test:25242}  FUNCTIONAL TESTS:  {Functional tests:24029}  GAIT: Distance walked: *** Assistive device utilized: {Assistive devices:23999} Level of assistance: {Levels of assistance:24026} Comments: ***  TREATMENT DATE:  02/12/24: PT eval and HEP                                                                                                                                  PATIENT EDUCATION:  Education details: PT evaluation, objective findings, POC, Importance of HEP, Precautions, Clinic policies Person educated: Patient Education method: Explanation and Demonstration Education comprehension: verbalized understanding and returned demonstration  HOME EXERCISE PROGRAM: ***  ASSESSMENT:  CLINICAL IMPRESSION: Patient is a 54 y.o. female who was seen today for physical therapy evaluation and treatment for M54.50 (ICD-10-CM) - Lumbar pain. On this date, patient demonstrates impaired self perception of function, decreased/impaired  ROM/mobility , decreased/impaired   strength, decreased endurance, and impaired balance, all of which may be contributing to patient's increased pain, decreased activity tolerance, and impairing their overall function. Patient will benefit from skilled physical therapy to address the above/below deficits in order to improve pain and overall function.    OBJECTIVE IMPAIRMENTS: {opptimpairments:25111}.   ACTIVITY LIMITATIONS: {activitylimitations:27494}  PARTICIPATION LIMITATIONS: {participationrestrictions:25113}  PERSONAL FACTORS: {Personal factors:25162} are also affecting patient's functional outcome.   REHAB POTENTIAL: {rehabpotential:25112}  CLINICAL DECISION MAKING: {clinical decision making:25114}  EVALUATION COMPLEXITY: {Evaluation complexity:25115}   GOALS: Goals reviewed with patient? No  SHORT TERM GOALS: Target date: 03/08/24 Patient will be independent with performance of HEP to demonstrate adequate self management of symptoms.  Baseline:  Goal status: INITIAL  2.   Patient will report at least a 25% improvement with function and/or pain reduction overall since beginning PT. Baseline:  Goal status: INITIAL   LONG TERM GOALS: Target date: 03/29/24  *** Baseline:  Goal  status: INITIAL  2.  *** Baseline:  Goal status:  INITIAL  3.  *** Baseline:  Goal status: INITIAL  4.  *** Baseline:  Goal status: INITIAL  5.  *** Baseline:  Goal status: INITIAL  6.  *** Baseline:  Goal status: INITIAL  PLAN:  PT FREQUENCY: 1-2x/week  PT DURATION: 6 weeks  PLANNED INTERVENTIONS: 97164- PT Re-evaluation, 97110-Therapeutic exercises, 97530- Therapeutic activity, 97112- Neuromuscular re-education, 97535- Self Care, 02859- Manual therapy, [id number]- Gait training, [id number]- Electrical stimulation (manual), [id number]- Ultrasound, 02987- Traction (mechanical), 20560 (1-2 muscles), 20561 (3+ muscles)- Dry Needling, Patient/Family education, Balance training, Stair training, Taping, Joint mobilization, Spinal mobilization, Cryotherapy, and Moist heat.  PLAN FOR NEXT SESSION: Review HEP and goals,    12:37 PM, 02/12/2024 Rosaria Settler, PT, DPT [facility] [place]  "

## 2024-02-12 NOTE — Telephone Encounter (Signed)
 Called patient concerning missed evaluation this date. Patient reports transportation issues hindered her from attending evaluation today but that she was still interested in therapy services. Call transferred to front desk staff for rescheduling.    4:41 PM, 02/12/2024 Rosaria Settler, PT, DPT [facility]

## 2024-02-14 ENCOUNTER — Inpatient Hospital Stay: Admitting: Oncology

## 2024-02-14 ENCOUNTER — Inpatient Hospital Stay

## 2024-02-15 ENCOUNTER — Other Ambulatory Visit (HOSPITAL_COMMUNITY): Payer: Self-pay

## 2024-02-15 ENCOUNTER — Ambulatory Visit (HOSPITAL_COMMUNITY)

## 2024-02-15 ENCOUNTER — Other Ambulatory Visit: Payer: Self-pay

## 2024-02-15 ENCOUNTER — Other Ambulatory Visit: Payer: Self-pay | Admitting: Oncology

## 2024-02-15 DIAGNOSIS — C189 Malignant neoplasm of colon, unspecified: Secondary | ICD-10-CM

## 2024-02-15 MED ORDER — LONSURF 20-8.19 MG PO TABS
60.0000 mg | ORAL_TABLET | Freq: Two times a day (BID) | ORAL | 2 refills | Status: AC
  Filled 2024-02-15: qty 60, 10d supply, fill #0
  Filled 2024-02-16: qty 60, 28d supply, fill #0
  Filled 2024-03-14: qty 60, 28d supply, fill #1

## 2024-02-15 NOTE — Telephone Encounter (Signed)
 Chart reviewed. Lonsurf  refilled per last office note with Dr. Davonna.

## 2024-02-16 ENCOUNTER — Other Ambulatory Visit: Payer: Self-pay

## 2024-02-20 ENCOUNTER — Other Ambulatory Visit (HOSPITAL_COMMUNITY): Payer: Self-pay

## 2024-02-20 ENCOUNTER — Other Ambulatory Visit: Payer: Self-pay

## 2024-02-20 NOTE — Progress Notes (Signed)
 Specialty Pharmacy Refill Coordination Note  Diana Fox is a 54 y.o. female contacted today regarding refills of specialty medication(s) Trifluridine -Tipiracil  (Lonsurf )   Patient requested Delivery   Delivery date: 02/23/24   Verified address: [address]   [place] [address]   Medication will be filled on: 02/22/24

## 2024-02-21 ENCOUNTER — Inpatient Hospital Stay: Attending: Hematology

## 2024-02-21 ENCOUNTER — Inpatient Hospital Stay: Admitting: Oncology

## 2024-02-21 ENCOUNTER — Inpatient Hospital Stay: Admitting: Licensed Clinical Social Worker

## 2024-02-21 ENCOUNTER — Inpatient Hospital Stay

## 2024-02-21 VITALS — BP 137/74 | HR 76 | Temp 98.6°F | Resp 18

## 2024-02-21 DIAGNOSIS — Z5112 Encounter for antineoplastic immunotherapy: Secondary | ICD-10-CM | POA: Insufficient documentation

## 2024-02-21 DIAGNOSIS — Z933 Colostomy status: Secondary | ICD-10-CM | POA: Insufficient documentation

## 2024-02-21 DIAGNOSIS — C786 Secondary malignant neoplasm of retroperitoneum and peritoneum: Secondary | ICD-10-CM | POA: Diagnosis not present

## 2024-02-21 DIAGNOSIS — C187 Malignant neoplasm of sigmoid colon: Secondary | ICD-10-CM | POA: Insufficient documentation

## 2024-02-21 DIAGNOSIS — D6481 Anemia due to antineoplastic chemotherapy: Secondary | ICD-10-CM | POA: Diagnosis not present

## 2024-02-21 DIAGNOSIS — K648 Other hemorrhoids: Secondary | ICD-10-CM | POA: Insufficient documentation

## 2024-02-21 DIAGNOSIS — Z79620 Long term (current) use of immunosuppressive biologic: Secondary | ICD-10-CM | POA: Insufficient documentation

## 2024-02-21 DIAGNOSIS — C7800 Secondary malignant neoplasm of unspecified lung: Secondary | ICD-10-CM | POA: Insufficient documentation

## 2024-02-21 DIAGNOSIS — T451X5A Adverse effect of antineoplastic and immunosuppressive drugs, initial encounter: Secondary | ICD-10-CM | POA: Insufficient documentation

## 2024-02-21 DIAGNOSIS — K56699 Other intestinal obstruction unspecified as to partial versus complete obstruction: Secondary | ICD-10-CM | POA: Diagnosis not present

## 2024-02-21 DIAGNOSIS — L853 Xerosis cutis: Secondary | ICD-10-CM | POA: Insufficient documentation

## 2024-02-21 DIAGNOSIS — C189 Malignant neoplasm of colon, unspecified: Secondary | ICD-10-CM

## 2024-02-21 DIAGNOSIS — Z793 Long term (current) use of hormonal contraceptives: Secondary | ICD-10-CM | POA: Insufficient documentation

## 2024-02-21 DIAGNOSIS — Z95828 Presence of other vascular implants and grafts: Secondary | ICD-10-CM

## 2024-02-21 DIAGNOSIS — D701 Agranulocytosis secondary to cancer chemotherapy: Secondary | ICD-10-CM | POA: Insufficient documentation

## 2024-02-21 DIAGNOSIS — R040 Epistaxis: Secondary | ICD-10-CM | POA: Insufficient documentation

## 2024-02-21 DIAGNOSIS — Z79899 Other long term (current) drug therapy: Secondary | ICD-10-CM | POA: Insufficient documentation

## 2024-02-21 LAB — COMPREHENSIVE METABOLIC PANEL WITH GFR
ALT: 6 U/L (ref 0–44)
AST: 18 U/L (ref 15–41)
Albumin: 4 g/dL (ref 3.5–5.0)
Alkaline Phosphatase: 52 U/L (ref 38–126)
Anion gap: 13 (ref 5–15)
BUN: 8 mg/dL (ref 6–20)
CO2: 21 mmol/L — ABNORMAL LOW (ref 22–32)
Calcium: 8.6 mg/dL — ABNORMAL LOW (ref 8.9–10.3)
Chloride: 108 mmol/L (ref 98–111)
Creatinine, Ser: 0.83 mg/dL (ref 0.44–1.00)
GFR, Estimated: >60 mL/min
Glucose, Bld: 96 mg/dL (ref 70–99)
Potassium: 3.4 mmol/L — ABNORMAL LOW (ref 3.5–5.1)
Sodium: 142 mmol/L (ref 135–145)
Total Bilirubin: 0.6 mg/dL (ref 0.0–1.2)
Total Protein: 7.1 g/dL (ref 6.5–8.1)

## 2024-02-21 LAB — CBC WITH DIFFERENTIAL/PLATELET
Abs Immature Granulocytes: 0 K/uL (ref 0.00–0.07)
Basophils Absolute: 0 K/uL (ref 0.0–0.1)
Basophils Relative: 0 %
Eosinophils Absolute: 0 K/uL (ref 0.0–0.5)
Eosinophils Relative: 1 %
HCT: 21.1 % — ABNORMAL LOW (ref 36.0–46.0)
Hemoglobin: 7.1 g/dL — ABNORMAL LOW (ref 12.0–15.0)
Immature Granulocytes: 0 %
Lymphocytes Relative: 57 %
Lymphs Abs: 1.2 K/uL (ref 0.7–4.0)
MCH: 35.3 pg — ABNORMAL HIGH (ref 26.0–34.0)
MCHC: 33.6 g/dL (ref 30.0–36.0)
MCV: 105 fL — ABNORMAL HIGH (ref 80.0–100.0)
Monocytes Absolute: 0.1 K/uL (ref 0.1–1.0)
Monocytes Relative: 7 %
Neutro Abs: 0.7 K/uL — ABNORMAL LOW (ref 1.7–7.7)
Neutrophils Relative %: 35 %
Platelets: 162 K/uL (ref 150–400)
RBC: 2.01 MIL/uL — ABNORMAL LOW (ref 3.87–5.11)
RDW: 16.3 % — ABNORMAL HIGH (ref 11.5–15.5)
Smear Review: NORMAL
WBC: 2 K/uL — ABNORMAL LOW (ref 4.0–10.5)
nRBC: 0 % (ref 0.0–0.2)

## 2024-02-21 LAB — PREPARE RBC (CROSSMATCH)

## 2024-02-21 LAB — MAGNESIUM: Magnesium: 1.8 mg/dL (ref 1.7–2.4)

## 2024-02-21 MED ORDER — SODIUM CHLORIDE 0.9 % IV SOLN: INTRAVENOUS | Status: DC

## 2024-02-21 MED ORDER — DIPHENHYDRAMINE HCL 25 MG PO TABS
25.0000 mg | ORAL_TABLET | Freq: Once | ORAL | Status: AC
  Administered 2024-02-21: 25 mg via ORAL
  Filled 2024-02-21: qty 1

## 2024-02-21 MED ORDER — DIPHENHYDRAMINE HCL 25 MG PO CAPS: 25.0000 mg | ORAL_CAPSULE | Freq: Once | ORAL | Status: DC

## 2024-02-21 MED ORDER — ACETAMINOPHEN 325 MG PO TABS
650.0000 mg | ORAL_TABLET | Freq: Once | ORAL | Status: AC
  Administered 2024-02-21: 650 mg via ORAL
  Filled 2024-02-21: qty 2

## 2024-02-21 MED ORDER — POTASSIUM CHLORIDE CRYS ER 20 MEQ PO TBCR
40.0000 meq | EXTENDED_RELEASE_TABLET | Freq: Once | ORAL | Status: AC
  Administered 2024-02-21: 40 meq via ORAL
  Filled 2024-02-21: qty 2

## 2024-02-21 MED ORDER — SODIUM CHLORIDE 0.9 % IV SOLN
4.5000 mg/kg | Freq: Once | INTRAVENOUS | Status: AC
  Administered 2024-02-21: 350 mg via INTRAVENOUS
  Filled 2024-02-21: qty 14

## 2024-02-21 MED ORDER — SODIUM CHLORIDE 0.9% IV SOLUTION
250.0000 mL | INTRAVENOUS | Status: DC
  Administered 2024-02-21: 100 mL via INTRAVENOUS

## 2024-02-21 NOTE — Progress Notes (Signed)
 Treatment and one unit of blood given per orders. Patient tolerated it well without problems. Vitals stable and discharged home from clinic ambulatory. Follow up as scheduled.

## 2024-02-21 NOTE — Progress Notes (Signed)
 [facility] CSW Progress Note  Visual Merchandiser met with patient to follow-up on emotional support.    Interventions: Pt reporting marital strain, requesting assistance w/ facilitating a conversation w/ her husband to help him understand the physical toll treatment has had on her.  CSW provided space for pt to express her feelings regarding the challenges she is currently experiencing w/ regards to her marriage.  Emotional support provided.  CSW suggested pt speak w/ her husband about having a conversation w/ CSW that would include them both and encouraged pt to consider what her goals would be with that conversation.  Pt to reach out to CSW after speaking w/ her husband to schedule an appointment if appropriate.  CSW provided pt w/ additional counseling options in the community should she wish to engage in long term individual counseling for herself.        Follow Up Plan:  Patient will contact CSW with any support or resource needs    Devere JONELLE Manna, LCSW Clinical Social Worker [facility]

## 2024-02-21 NOTE — Patient Instructions (Signed)
 [facility] [place] - A DEPT OF [facility]  Discharge Instructions: Thank you for choosing [facility] to provide your oncology and hematology care.  If you have a lab appointment with the Cancer Center - please note that after April 8th, 2024, all labs will be drawn in the cancer center.  You do not have to check in or register with the main entrance as you have in the past but will complete your check-in in the cancer center.  Wear comfortable clothing and clothing appropriate for easy access to any Portacath or PICC line.   We strive to give you quality time with your provider. You may need to reschedule your appointment if you arrive late (15 or more minutes).  Arriving late affects you and other patients whose appointments are after yours.  Also, if you miss three or more appointments without notifying the office, you may be dismissed from the clinic at the providers discretion.      For prescription refill requests, have your pharmacy contact our office and allow 72 hours for refills to be completed.    Today you received the following chemotherapy and/or immunotherapy agents Vegzelma , 1 unit of blood   To help prevent nausea and vomiting after your treatment, we encourage you to take your nausea medication as directed.  BELOW ARE SYMPTOMS THAT SHOULD BE REPORTED IMMEDIATELY: *FEVER GREATER THAN 100.4 F (38 C) OR HIGHER *CHILLS OR SWEATING *NAUSEA AND VOMITING THAT IS NOT CONTROLLED WITH YOUR NAUSEA MEDICATION *UNUSUAL SHORTNESS OF BREATH *UNUSUAL BRUISING OR BLEEDING *URINARY PROBLEMS (pain or burning when urinating, or frequent urination) *BOWEL PROBLEMS (unusual diarrhea, constipation, pain near the anus) TENDERNESS IN MOUTH AND THROAT WITH OR WITHOUT PRESENCE OF ULCERS (sore throat, sores in mouth, or a toothache) UNUSUAL RASH, SWELLING OR PAIN  UNUSUAL VAGINAL DISCHARGE OR ITCHING   Items with * indicate a potential emergency and should  be followed up as soon as possible or go to the Emergency Department if any problems should occur.  Please show the CHEMOTHERAPY ALERT CARD or IMMUNOTHERAPY ALERT CARD at check-in to the Emergency Department and triage nurse.  Should you have questions after your visit or need to cancel or reschedule your appointment, please contact [facility] [place] - A DEPT OF [facility] [phone]  and follow the prompts.  Office hours are 8:00 a.m. to 4:30 p.m. Monday - Friday. Please note that voicemails left after 4:00 p.m. may not be returned until the following business day.  We are closed weekends and major holidays. You have access to a nurse at all times for urgent questions. Please call the main number to the clinic [phone] and follow the prompts.  For any non-urgent questions, you may also contact your provider using MyChart. We now offer e-Visits for anyone 67 and older to request care online for non-urgent symptoms. For details visit [email or web address].   Also download the MyChart app! Go to the app store, search MyChart, open the app, select [place], and log in with your MyChart username and password.

## 2024-02-21 NOTE — Progress Notes (Signed)
 Patient presents today for Vegzelma  infusion. Vital signs within parameters for treatment. HGB 7.1, ANC 0.7, and WBC 2.0. Potassium 3.4. Message sent to Dr. Davonna / A.Lenon PEAK . Message received to proceed with treatment and infuse 1 unit of PRBC's. Standing orders followed. Patient will receive 40 mEq of K-DUR PO x 1 dose.

## 2024-02-21 NOTE — Progress Notes (Signed)
 Adjust dose of Vegzelma  with new weight 70.3 kg  Vegzelma  5 mg/kg = 350 mg  V.O. Dr Ivery Molt, PharmD

## 2024-02-22 ENCOUNTER — Other Ambulatory Visit: Payer: Self-pay

## 2024-02-22 ENCOUNTER — Encounter: Payer: Self-pay | Admitting: Oncology

## 2024-02-22 ENCOUNTER — Encounter (HOSPITAL_COMMUNITY): Payer: Self-pay | Admitting: Oncology

## 2024-02-22 LAB — BPAM RBC
Blood Product Expiration Date: 202601222359
ISSUE DATE / TIME: 202601141031
Unit Type and Rh: 1700

## 2024-02-22 LAB — TYPE AND SCREEN
ABO/RH(D): B POS
Antibody Screen: NEGATIVE
Unit division: 0

## 2024-02-26 ENCOUNTER — Ambulatory Visit (HOSPITAL_COMMUNITY)

## 2024-03-04 ENCOUNTER — Other Ambulatory Visit: Payer: Self-pay

## 2024-03-04 ENCOUNTER — Ambulatory Visit (HOSPITAL_COMMUNITY)

## 2024-03-04 ENCOUNTER — Other Ambulatory Visit: Payer: Self-pay | Admitting: Pharmacist

## 2024-03-04 NOTE — Progress Notes (Signed)
 Specialty Pharmacy Ongoing Clinical Assessment Note  Diana Fox is a 54 y.o. female who is being followed by the specialty pharmacy service for RxSp Oncology   Patient's specialty medication(s) reviewed today: No data recorded  Missed doses in the last 4 weeks: No data recorded  Patient/Caregiver did not have any additional questions or concerns.   No data recorded  No data recorded  No data recorded   Goals Addressed             This Visit's Progress    Slow Disease Progression   On track    Patient is on track. Patient will maintain adherence.  Most recent CEA had come down to 265.0 on 12/05/23 and the next scan is planned for 01/2024.         Follow up: 3 months  Lyle LELON Chalk Specialty Pharmacist

## 2024-03-08 ENCOUNTER — Inpatient Hospital Stay: Admitting: Oncology

## 2024-03-08 ENCOUNTER — Inpatient Hospital Stay

## 2024-03-08 VITALS — BP 134/80 | HR 74 | Temp 97.2°F | Resp 18

## 2024-03-08 DIAGNOSIS — D6481 Anemia due to antineoplastic chemotherapy: Secondary | ICD-10-CM

## 2024-03-08 DIAGNOSIS — C189 Malignant neoplasm of colon, unspecified: Secondary | ICD-10-CM

## 2024-03-08 DIAGNOSIS — D702 Other drug-induced agranulocytosis: Secondary | ICD-10-CM

## 2024-03-08 DIAGNOSIS — Z95828 Presence of other vascular implants and grafts: Secondary | ICD-10-CM

## 2024-03-08 DIAGNOSIS — T451X5A Adverse effect of antineoplastic and immunosuppressive drugs, initial encounter: Secondary | ICD-10-CM

## 2024-03-08 DIAGNOSIS — G622 Polyneuropathy due to other toxic agents: Secondary | ICD-10-CM

## 2024-03-08 DIAGNOSIS — Z5112 Encounter for antineoplastic immunotherapy: Secondary | ICD-10-CM | POA: Diagnosis not present

## 2024-03-08 LAB — COMPREHENSIVE METABOLIC PANEL WITH GFR
ALT: 7 U/L (ref 0–44)
AST: 18 U/L (ref 15–41)
Albumin: 4.1 g/dL (ref 3.5–5.0)
Alkaline Phosphatase: 57 U/L (ref 38–126)
Anion gap: 13 (ref 5–15)
BUN: 9 mg/dL (ref 6–20)
CO2: 18 mmol/L — ABNORMAL LOW (ref 22–32)
Calcium: 9 mg/dL (ref 8.9–10.3)
Chloride: 110 mmol/L (ref 98–111)
Creatinine, Ser: 0.92 mg/dL (ref 0.44–1.00)
GFR, Estimated: >60 mL/min
Glucose, Bld: 89 mg/dL (ref 70–99)
Potassium: 3.9 mmol/L (ref 3.5–5.1)
Sodium: 141 mmol/L (ref 135–145)
Total Bilirubin: 0.4 mg/dL (ref 0.0–1.2)
Total Protein: 7.3 g/dL (ref 6.5–8.1)

## 2024-03-08 LAB — SAMPLE TO BLOOD BANK

## 2024-03-08 LAB — URINALYSIS, DIPSTICK ONLY
Bilirubin Urine: NEGATIVE
Glucose, UA: NEGATIVE mg/dL
Hgb urine dipstick: NEGATIVE
Ketones, ur: NEGATIVE mg/dL
Nitrite: NEGATIVE
Protein, ur: 30 mg/dL — AB
Specific Gravity, Urine: 1.01 (ref 1.005–1.030)
pH: 6 (ref 5.0–8.0)

## 2024-03-08 LAB — CBC WITH DIFFERENTIAL/PLATELET
Abs Immature Granulocytes: 0.02 10*3/uL (ref 0.00–0.07)
Basophils Absolute: 0 10*3/uL (ref 0.0–0.1)
Basophils Relative: 0 %
Eosinophils Absolute: 0 10*3/uL (ref 0.0–0.5)
Eosinophils Relative: 0 %
HCT: 27.1 % — ABNORMAL LOW (ref 36.0–46.0)
Hemoglobin: 8.9 g/dL — ABNORMAL LOW (ref 12.0–15.0)
Immature Granulocytes: 1 %
Lymphocytes Relative: 49 %
Lymphs Abs: 1.4 10*3/uL (ref 0.7–4.0)
MCH: 33.2 pg (ref 26.0–34.0)
MCHC: 32.8 g/dL (ref 30.0–36.0)
MCV: 101.1 fL — ABNORMAL HIGH (ref 80.0–100.0)
Monocytes Absolute: 0.1 10*3/uL (ref 0.1–1.0)
Monocytes Relative: 3 %
Neutro Abs: 1.3 10*3/uL — ABNORMAL LOW (ref 1.7–7.7)
Neutrophils Relative %: 47 %
Platelets: 133 10*3/uL — ABNORMAL LOW (ref 150–400)
RBC: 2.68 MIL/uL — ABNORMAL LOW (ref 3.87–5.11)
RDW: 19 % — ABNORMAL HIGH (ref 11.5–15.5)
WBC: 2.8 10*3/uL — ABNORMAL LOW (ref 4.0–10.5)
nRBC: 0 % (ref 0.0–0.2)

## 2024-03-08 LAB — MAGNESIUM: Magnesium: 2 mg/dL (ref 1.7–2.4)

## 2024-03-08 MED ORDER — SODIUM CHLORIDE 0.9 % IV SOLN: INTRAVENOUS | Status: DC

## 2024-03-08 MED ORDER — SODIUM CHLORIDE 0.9 % IV SOLN
4.3000 mg/kg | Freq: Once | INTRAVENOUS | Status: AC
  Administered 2024-03-08: 350 mg via INTRAVENOUS
  Filled 2024-03-08: qty 14

## 2024-03-08 MED ORDER — UREA 10 % EX CREA: TOPICAL_CREAM | CUTANEOUS | 0 refills | Status: AC | PRN

## 2024-03-08 NOTE — Patient Instructions (Signed)
 [facility] [place] - A DEPT OF [facility]  Discharge Instructions: Thank you for choosing [facility] to provide your oncology and hematology care.  If you have a lab appointment with the Cancer Center - please note that after April 8th, 2024, all labs will be drawn in the cancer center.  You do not have to check in or register with the main entrance as you have in the past but will complete your check-in in the cancer center.  Wear comfortable clothing and clothing appropriate for easy access to any Portacath or PICC line.   We strive to give you quality time with your provider. You may need to reschedule your appointment if you arrive late (15 or more minutes).  Arriving late affects you and other patients whose appointments are after yours.  Also, if you miss three or more appointments without notifying the office, you may be dismissed from the clinic at the providers discretion.      For prescription refill requests, have your pharmacy contact our office and allow 72 hours for refills to be completed.    Today you received the following chemotherapy and/or immunotherapy agents Vegzelma .  Bevacizumab  Injection What is this medication? BEVACIZUMAB  (be va SIZ yoo mab) treats some types of cancer. It works by blocking a protein that causes cancer cells to grow and multiply. This helps to slow or stop the spread of cancer cells. It is a monoclonal antibody. This medicine may be used for other purposes; ask your health care provider or pharmacist if you have questions. COMMON BRAND NAME(S): Alymsys , Avastin , Jobevne, MVASI , Vegzalma, Zirabev  What should I tell my care team before I take this medication? They need to know if you have any of these conditions: Blood clots Coughing up blood Having or recent surgery Heart failure High blood pressure History of a connection between 2 or more body parts that do not usually connect (fistula) History of a tear  in your stomach or intestines Protein in your urine An unusual or allergic reaction to bevacizumab , other medications, foods, dyes, or preservatives Pregnant or trying to get pregnant Breast-feeding How should I use this medication? This medication is injected into a vein. It is given by your care team in a hospital or clinic setting. Talk to your care team the use of this medication in children. Special care may be needed. Overdosage: If you think you have taken too much of this medicine contact a poison control center or emergency room at once. NOTE: This medicine is only for you. Do not share this medicine with others. What if I miss a dose? Keep appointments for follow-up doses. It is important not to miss your dose. Call your care team if you are unable to keep an appointment. What may interact with this medication? Interactions are not expected. This list may not describe all possible interactions. Give your health care provider a list of all the medicines, herbs, non-prescription drugs, or dietary supplements you use. Also tell them if you smoke, drink alcohol, or use illegal drugs. Some items may interact with your medicine. What should I watch for while using this medication? Your condition will be monitored carefully while you are receiving this medication. You may need blood work while taking this medication. This medication may make you feel generally unwell. This is not uncommon as chemotherapy can affect healthy cells as well as cancer cells. Report any side effects. Continue your course of treatment even though you  feel ill unless your care team tells you to stop. This medication may increase your risk to bruise or bleed. Call your care team if you notice any unusual bleeding. Before having surgery, talk to your care team to make sure it is ok. This medication can increase the risk of poor healing of your surgical site or wound. You will need to stop this medication for 28 days  before surgery. After surgery, wait at least 28 days before restarting this medication. Make sure the surgical site or wound is healed enough before restarting this medication. Talk to your care team if questions. Talk to your care team if you may be pregnant. Serious birth defects can occur if you take this medication during pregnancy and for 6 months after the last dose. Contraception is recommended while taking this medication and for 6 months after the last dose. Your care team can help you find the option that works for you. Do not breastfeed while taking this medication and for 6 months after the last dose. This medication can cause infertility. Talk to your care team if you are concerned about your fertility. What side effects may I notice from receiving this medication? Side effects that you should report to your care team as soon as possible: Allergic reactions--skin rash, itching, hives, swelling of the face, lips, tongue, or throat Bleeding--bloody or black, tar-like stools, vomiting blood or brown material that looks like coffee grounds, red or dark brown urine, small red or purple spots on skin, unusual bruising or bleeding Blood clot--pain, swelling, or warmth in the leg, shortness of breath, chest pain Heart attack--pain or tightness in the chest, shoulders, arms, or jaw, nausea, shortness of breath, cold or clammy skin, feeling faint or lightheaded Heart failure--shortness of breath, swelling of the ankles, feet, or hands, sudden weight gain, unusual weakness or fatigue Increase in blood pressure Infection--fever, chills, cough, sore throat, wounds that don't heal, pain or trouble when passing urine, general feeling of discomfort or being unwell Infusion reactions--chest pain, shortness of breath or trouble breathing, feeling faint or lightheaded Kidney injury--decrease in the amount of urine, swelling of the ankles, hands, or feet Stomach pain that is severe, does not go away, or gets  worse Stroke--sudden numbness or weakness of the face, arm, or leg, trouble speaking, confusion, trouble walking, loss of balance or coordination, dizziness, severe headache, change in vision Sudden and severe headache, confusion, change in vision, seizures, which may be signs of posterior reversible encephalopathy syndrome (PRES) Side effects that usually do not require medical attention (report these to your care team if they continue or are bothersome): Back pain Change in taste Diarrhea Dry skin Increased tears Nosebleed This list may not describe all possible side effects. Call your doctor for medical advice about side effects. You may report side effects to FDA at 1-800-FDA-1088. Where should I keep my medication? This medication is given in a hospital or clinic. It will not be stored at home. NOTE: This sheet is a summary. It may not cover all possible information. If you have questions about this medicine, talk to your doctor, pharmacist, or health care provider.  2025 Elsevier/Gold Standard (2023-10-17 00:00:00)    To help prevent nausea and vomiting after your treatment, we encourage you to take your nausea medication as directed.  BELOW ARE SYMPTOMS THAT SHOULD BE REPORTED IMMEDIATELY: *FEVER GREATER THAN 100.4 F (38 C) OR HIGHER *CHILLS OR SWEATING *NAUSEA AND VOMITING THAT IS NOT CONTROLLED WITH YOUR NAUSEA MEDICATION *UNUSUAL SHORTNESS OF  BREATH *UNUSUAL BRUISING OR BLEEDING *URINARY PROBLEMS (pain or burning when urinating, or frequent urination) *BOWEL PROBLEMS (unusual diarrhea, constipation, pain near the anus) TENDERNESS IN MOUTH AND THROAT WITH OR WITHOUT PRESENCE OF ULCERS (sore throat, sores in mouth, or a toothache) UNUSUAL RASH, SWELLING OR PAIN  UNUSUAL VAGINAL DISCHARGE OR ITCHING   Items with * indicate a potential emergency and should be followed up as soon as possible or go to the Emergency Department if any problems should occur.  Please show the  CHEMOTHERAPY ALERT CARD or IMMUNOTHERAPY ALERT CARD at check-in to the Emergency Department and triage nurse.  Should you have questions after your visit or need to cancel or reschedule your appointment, please contact [facility] [place] - A DEPT OF [facility] [phone]  and follow the prompts.  Office hours are 8:00 a.m. to 4:30 p.m. Monday - Friday. Please note that voicemails left after 4:00 p.m. may not be returned until the following business day.  We are closed weekends and major holidays. You have access to a nurse at all times for urgent questions. Please call the main number to the clinic [phone] and follow the prompts.  For any non-urgent questions, you may also contact your provider using MyChart. We now offer e-Visits for anyone 77 and older to request care online for non-urgent symptoms. For details visit [email or web address].   Also download the MyChart app! Go to the app store, search MyChart, open the app, select [place], and log in with your MyChart username and password.

## 2024-03-08 NOTE — Progress Notes (Signed)
 Patient presents today for chemotherapy/immunotherapy infusion of Vegzelma . Patient is in satisfactory condition with no new complaints voiced.  Vital signs are stable.  Labs reviewed by Dr. Davonna during the office visit and all labs are within treatment parameters with exception of ANC 1.3. Per Dr Davonna, okay to treat.  We will proceed with treatment per MD orders.   Patient tolerated treatment well with no complaints voiced.  Patient left ambulatory in stable condition.  Vital signs stable at discharge.  Follow up as scheduled.

## 2024-03-08 NOTE — Progress Notes (Signed)
 Adjust dose of Vegzelma  with new weight 71.1 kg   Vegzelma  5 mg/kg = 350 mg  Give same dose as last visit 350 mg.   V.O. Dr Ivery Molt, PharmD

## 2024-03-08 NOTE — Progress Notes (Signed)
 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 S/p first-line treatment with FOLFOXIRI and panitumumab  progression Second line-FOLFOX plus panitumumab  Third line-Lonsurf  plus bevacizumab    - Patient tolerating Lonsurf  and bevacizumab  well.  She reports no complaints from it today - Scheduled for cycle 10-day1 today.  Physical exam and labs stable.  Will proceed with chemotherapy today. - Repeat CT scan in 3 months.  Due now. Patient missed previous appointments. Emphasized the importance of Imaging in metastatic disease.  - CEA stable at this time, will keep a close eye.  Will repeat with every cycle. - Labs reviewed today: CMP: Normal creatinine, normal potassium and magnesium , normal LFTs.  CBC: WBC: 2.8, hemoglobin: 8.9, platelets: 133.  UA with no significant proteinuria  - Continue Lonsurf  60 mg 2 times daily on days 1-5, 8-12 of a 28-day cycle  Return to clinic in 4 weeks for follow up and CT review  Chemotherapy-induced anemia Anemia secondary to Lonsurf  with hemoglobin at 8.9 g/dL. Asymptomatic, iron  studies normal, no transfusion indicated.  - Advised discontinuation of iron  supplementation. - Monitored hemoglobin; no transfusion indicated. - Will transfuse for Hb<7 or <8 with symptoma.  Chemotherapy-induced neutropenia Neutropenia due to Lonsurf , asymptomatic with no infectious symptoms. - Monitored white  blood cell count; no intervention required.  Colostomy status She inquired about colonoscopy for potential colostomy reversal.   - Consider patient has CEA that has been uptrending recently that he will have to hold Avastin  for both colonoscopy and colostomy reversal, we will delay this at this time. - Reversal of colostomy is usually not indicated in metastatic colon cancer but patient is very motivated to do this.   The patient understands the plans discussed today and is in agreement with them.  She knows to contact our office if she develops concerns prior to her next appointment.  The total time spent in the appointment was 32 minutes for the encounter  with patient, including review of chart and various tests results, discussions about plan of care and coordination of care plan     Mickiel Dry, MD  [facility]  - A DEPT OF [facility] [address] [place] [address] Dept: [phone] Dept Fax: [phone]   No orders of the defined types were placed in this encounter.    ONCOLOGY HISTORY:   I have reviewed her chart and materials related to her cancer extensively and collaborated history with the patient. 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 There are bilateral mildly enlarged common iliac chain nodes associated with  this. There are mildly dilated mid to lower abdominal small bowel segments up to 3.2 cm without visible transition and decompressed but occasionally thickened small  bowel segments in the right lower abdomen. -03/02/2021: Colonoscopy found: Nonbleeding internal hemorrhoids, severe stenosis in the sigmoid colon at approximately 18 cm from anal verge.  Pathology of sigmoid colon: Adenocarcinoma, moderately to poorly differentiated.  -03/24/2021: CEA 61.5.  -03/26/2021: Sigmoid colon resection, partial small bowel resection and Hartman's procedure. Tumor extended outside the colon to the pelvic brim and the surgeon was unable to fully excise all of the tumor in the pelvis.  Pathology: Colonic adenocarcinoma, moderately to poorly differentiated, involving the subserosal adipose tissue and present at the margin. Small bowel resection positive for adenocarcinoma nodules in the serosal fat. 9 out of 9 lymph nodes positive for metastatic carcinoma (9/9). MMR preserved. pT4, pN2b  -03/29/2021: NGS: KRAS/NRAS negative, BRAF negative, HER2 IHC (0) negative, MSI-stable, PDL1 : 0, Negative, POLE: Negative, PTEN: IHC: positive 1+, 100%. -04/29/2021: PET: Multifocal hypermetabolic nodal, peritoneal and anterior abdominal wall hypermetabolic activity. Based on the presence of metastatic disease within lymph nodes and along the serosal surface of the small bowel at time of the patient's prior surgery, these findings are worrisome for progressive nodal metastatic disease and peritoneal carcinomatosis. No evidence of hepatic metastatic disease or other distant metastases. -05/17/2021-11/2021: FOLFOXIRI and Panitumumab , oxaliplatin  discontinued after 10 cycles -11/2021-12/29/2022: FOLFIRI and Panitumumab , discontinued due to progression -01/10/2023: CT CAP: Interval enlargement of a heterogeneous mixed solid and cystic mass arising from the right adnexa, consistent with worsened ovarian metastatic disease. Interval enlargement of a soft tissue mass in the sigmoid mesentery consistent with worsened mesenteric soft tissue or nodal metastatic disease. -10/04/2021: Colonoscopy negative for  malignancy.  -01/18/2023-05/31/2023: FOLFOX and bevacizumab , discontinued due to progression -06/07/2023: CT CAP: Interval enlargement of the complex mixed cystic and solid mass arising from the right adnexa. Interval enlargement of the soft tissue mass/lymph node in the sigmoid mesentery along the left common iliac artery. -06/16/2023: Guardant360: GNAS, T p53, APC, MSI high not detected, TMB-low.  -06/19/2023-current: Lonsurf  and bevacizumab   -09/05/2023: CT CAP: Stable soft tissue nodularity in the sigmoid mesocolon. Slight improvement in peritoneal nodularity along the LEFT pericolic gutter.  No significant change in large complex cystic mass in the RIGHT lower quadrant presumably originating from the RIGHT ovary. -09/11/2023: CEA: 306 - 10/26/2023: CT CAP: No significant change in a bulky multicystic mass arising from the right ovary, measuring 14.4 x 11.9 cm. Unchanged metastatic soft tissue mass or lymph node partially encasing the inferior mesenteric artery in the sigmoid mesocolon measuring 1.9 x 1.8 cm. Unchanged tiny pulmonary nodules, nonspecific although most likely benign and incidental.   Current Treatment:  Lonsurf  and bevacizumab    INTERVAL HISTORY:   Discussed the use of AI scribe software for clinical note transcription with the patient, who gave verbal consent to proceed.  History of Present Illness British Moyd is a 54 year old female with stage IV sigmoid colon adenocarcinoma who presents for chemotherapy infusion and follow-up.  She is currently receiving fourth-line systemic chemotherapy with Lonsurf  (trifluridine -tipiracil ) and bevacizumab . Her CT scan schedule was recently modified due to school and work obligations, resulting in a delay of her planned interval imaging, which is now scheduled for February. She has not yet undergone interval imaging since initiation of the current regimen, and assessment of treatment response is pending.  She denies gastrointestinal  adverse effects, including diarrhea, from oral chemotherapy. She reports persistent xerosis and increased nasal mucus, particularly in  the mornings, with occasional epistaxis. She has trialed multiple topical therapies for xerosis without relief and will initiate urea  cream as recommended. No other new symptoms or changes in her overall condition are noted.  Her most recent laboratory results show a hemoglobin of 8.9 g/dL and a mildly decreased white blood cell count. She remains asymptomatic, without fatigue or decreased energy, and does not require transfusion. 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    I have reviewed the past medical history, past surgical history, social history and family history with the patient and they are unchanged from previous note.  ALLERGIES:  is allergic to zithromax  [azithromycin ], motrin [ibuprofen], sudafed [pseudoephedrine  hcl], and zestril [lisinopril].  MEDICATIONS:  Current Outpatient Medications  Medication Sig Dispense Refill   amLODipine  (NORVASC ) 10 MG tablet Take 1 tablet (10 mg total) by mouth daily. 90 tablet 1   cetirizine (ZYRTEC) 10 MG tablet SMARTSIG:1 Tablet(s) By Mouth Every Evening     fexofenadine  (ALLEGRA ) 180 MG tablet Take 1 tablet (180 mg total) by mouth daily. 30 tablet 0   fluticasone  (FLONASE ) 50 MCG/ACT nasal spray Place 2 sprays into both nostrils daily. 16 g 12   lidocaine  4 % Place 1 patch onto the skin daily. 30 patch 0   magnesium  oxide (MAG-OX) 400 (240 Mg) MG tablet Take 1 tablet (400 mg total) by mouth 2 (two) times daily. 60 tablet 6   medroxyPROGESTERone (DEPO-PROVERA) 150 MG/ML injection Inject 150 mg into the muscle every 3 (three) months.     Multiple Vitamin (MULTIVITAMIN) tablet Take 1 tablet by mouth daily.     nebivolol  (BYSTOLIC ) 10 MG tablet Take 10 mg by mouth daily.     pantoprazole  (PROTONIX ) 40 MG tablet Take 40 mg by mouth daily as needed  (acid reflux).     potassium chloride  SA (KLOR-CON  M20) 20 MEQ tablet Take 2 tablets (40 mEq total) by mouth 2 (two) times daily. 360 tablet 3   prochlorperazine  (COMPAZINE ) 10 MG tablet Take 1 tablet (10 mg total) by mouth every 6 (six) hours as needed for nausea or vomiting. 60 tablet 2   trifluridine -tipiracil  (LONSURF ) 20-8.19 MG tablet Take 3 tablets (60 mg of trifluridine  total) by mouth 2 (two) times daily after a meal. Take within 1 hr after AM & PM meals on days 1-5, 8-12. Repeat every 28 days. 60 tablet 2   No current facility-administered medications for this visit.     VITALS:  There were no vitals taken for this visit.  Wt Readings from Last 3 Encounters:  02/21/24 155 lb (70.3 kg)  02/07/24 160 lb 11.5 oz (72.9 kg)  01/24/24 159 lb (72.1 kg)    There is no height or weight on file to calculate BMI.  Performance status (ECOG): 0 - Asymptomatic  PHYSICAL EXAM:   GENERAL:alert, no distress and comfortable SKIN: skin color, texture, turgor are normal, no rashes or significant lesions LYMPH:  no palpable lymphadenopathy in the cervical, axillary or inguinal LUNGS: clear to auscultation and percussion with normal breathing effort HEART: regular rate & rhythm and no murmurs and no lower extremity edema ABDOMEN:abdomen soft, non-tender and normal bowel sounds, left colostomy bag present Musculoskeletal:no cyanosis of digits and no clubbing  NEURO: alert & oriented x 3 with fluent speech  LABORATORY DATA:  I have reviewed the data as listed  Lab Results  Component Value Date   WBC 2.0 (L) 02/21/2024   NEUTROABS 0.7 (L) 02/21/2024  HGB 7.1 (L) 02/21/2024   HCT 21.1 (L) 02/21/2024   MCV 105.0 (H) 02/21/2024   PLT 162 02/21/2024     Chemistry      Component Value Date/Time   NA 142 02/21/2024 0810   K 3.4 (L) 02/21/2024 0810   CL 108 02/21/2024 0810   CO2 21 (L) 02/21/2024 0810   BUN 8 02/21/2024 0810   CREATININE 0.83 02/21/2024 0810      Component Value  Date/Time   CALCIUM  8.6 (L) 02/21/2024 0810   ALKPHOS 52 02/21/2024 0810   AST 18 02/21/2024 0810   ALT 6 02/21/2024 0810   BILITOT 0.6 02/21/2024 0810       Latest Reference Range & Units 10/10/23 09:28  Iron  28 - 170 ug/dL 83  UIBC ug/dL 828  TIBC 749 - 549 ug/dL 745  Saturation Ratios 10.4 - 31.8 % 33 (H)  Ferritin 11 - 307 ng/mL 176  Folate >5.9 ng/mL 16.2  Vitamin B12 180 - 914 pg/mL 543  (H): Data is abnormally high   Latest Reference Range & Units 02/07/24 08:09  CEA 0.0 - 4.7 ng/mL 272.0 (H)  (H): Data is abnormally high   Latest Reference Range & Units 03/08/24 10:37  Appearance CLEAR  CLOUDY !  Bilirubin Urine NEGATIVE  NEGATIVE  Color, Urine YELLOW  YELLOW  Glucose, UA NEGATIVE mg/dL NEGATIVE  Hgb urine dipstick NEGATIVE  NEGATIVE  Ketones, ur NEGATIVE mg/dL NEGATIVE  Leukocytes,Ua NEGATIVE  TRACE !  Nitrite NEGATIVE  NEGATIVE  pH 5.0 - 8.0  6.0  Protein NEGATIVE mg/dL 30 !  Specific Gravity, Urine 1.005 - 1.030  1.010  !: Data is abnormal  RADIOGRAPHIC STUDIES: I have personally reviewed the radiological images as listed and agreed with the findings in the report.  "

## 2024-03-09 LAB — CEA: CEA: 284 ng/mL — ABNORMAL HIGH (ref 0.0–4.7)

## 2024-03-10 ENCOUNTER — Other Ambulatory Visit: Payer: Self-pay

## 2024-03-11 ENCOUNTER — Ambulatory Visit (HOSPITAL_COMMUNITY)

## 2024-03-11 ENCOUNTER — Ambulatory Visit (HOSPITAL_COMMUNITY)
Admission: RE | Admit: 2024-03-11 | Discharge: 2024-03-11 | Disposition: A | Discharge status: Home / Self Care | Source: Ambulatory Visit | Attending: Oncology | Admitting: Oncology

## 2024-03-11 DIAGNOSIS — C189 Malignant neoplasm of colon, unspecified: Secondary | ICD-10-CM

## 2024-03-11 MED ORDER — IOHEXOL 9 MG/ML PO SOLN
500.0000 mL | ORAL | Status: AC
  Administered 2024-03-11: 500 mL via ORAL

## 2024-03-11 MED ORDER — IOHEXOL 300 MG/ML  SOLN
100.0000 mL | Freq: Once | INTRAMUSCULAR | Status: AC | PRN
  Administered 2024-03-11: 100 mL via INTRAVENOUS

## 2024-03-11 MED ORDER — IOHEXOL 9 MG/ML PO SOLN
ORAL | Status: AC
  Filled 2024-03-11: qty 1000

## 2024-03-13 ENCOUNTER — Inpatient Hospital Stay

## 2024-03-13 ENCOUNTER — Inpatient Hospital Stay: Admitting: Oncology

## 2024-03-14 ENCOUNTER — Other Ambulatory Visit: Payer: Self-pay

## 2024-03-15 ENCOUNTER — Other Ambulatory Visit: Payer: Self-pay | Admitting: Oncology

## 2024-03-15 DIAGNOSIS — Z95828 Presence of other vascular implants and grafts: Secondary | ICD-10-CM

## 2024-03-15 DIAGNOSIS — C189 Malignant neoplasm of colon, unspecified: Secondary | ICD-10-CM

## 2024-03-20 ENCOUNTER — Ambulatory Visit (HOSPITAL_COMMUNITY)

## 2024-03-21 ENCOUNTER — Inpatient Hospital Stay

## 2024-03-22 ENCOUNTER — Inpatient Hospital Stay

## 2024-03-27 ENCOUNTER — Inpatient Hospital Stay

## 2024-04-08 ENCOUNTER — Inpatient Hospital Stay: Admitting: Oncology

## 2024-04-08 ENCOUNTER — Inpatient Hospital Stay

## 2024-04-08 ENCOUNTER — Inpatient Hospital Stay: Attending: Hematology

## 2024-04-10 ENCOUNTER — Inpatient Hospital Stay: Admitting: Oncology

## 2024-04-10 ENCOUNTER — Inpatient Hospital Stay

## 2024-04-22 ENCOUNTER — Inpatient Hospital Stay

## 2024-04-24 ENCOUNTER — Inpatient Hospital Stay

## 2024-04-24 ENCOUNTER — Inpatient Hospital Stay: Attending: Hematology
